# Patient Record
Sex: Male | Born: 1990 | Race: White | Hispanic: No | Marital: Single | Smoking: Never smoker
Health system: Southern US, Community
[De-identification: ages and names within clinical notes are randomized; demographics above are authoritative.]

## PROBLEM LIST (undated history)

## (undated) DIAGNOSIS — K859 Acute pancreatitis without necrosis or infection, unspecified: Secondary | ICD-10-CM

## (undated) DIAGNOSIS — R519 Headache, unspecified: Secondary | ICD-10-CM

## (undated) DIAGNOSIS — I350 Nonrheumatic aortic (valve) stenosis: Secondary | ICD-10-CM

## (undated) DIAGNOSIS — R011 Cardiac murmur, unspecified: Secondary | ICD-10-CM

## (undated) DIAGNOSIS — F1910 Other psychoactive substance abuse, uncomplicated: Secondary | ICD-10-CM

## (undated) DIAGNOSIS — J45909 Unspecified asthma, uncomplicated: Secondary | ICD-10-CM

## (undated) DIAGNOSIS — J189 Pneumonia, unspecified organism: Secondary | ICD-10-CM

---

## 2016-11-01 DIAGNOSIS — Q231 Congenital insufficiency of aortic valve: Secondary | ICD-10-CM | POA: Insufficient documentation

## 2016-11-01 DIAGNOSIS — I350 Nonrheumatic aortic (valve) stenosis: Secondary | ICD-10-CM | POA: Insufficient documentation

## 2020-03-01 ENCOUNTER — Other Ambulatory Visit: Payer: Self-pay

## 2020-03-01 DIAGNOSIS — Z20822 Contact with and (suspected) exposure to covid-19: Secondary | ICD-10-CM

## 2020-03-03 LAB — NOVEL CORONAVIRUS, NAA: SARS-CoV-2, NAA: DETECTED — AB

## 2020-03-03 LAB — SARS-COV-2, NAA 2 DAY TAT

## 2020-04-18 ENCOUNTER — Encounter (HOSPITAL_COMMUNITY): Payer: Self-pay

## 2020-04-18 ENCOUNTER — Emergency Department (HOSPITAL_COMMUNITY)
Admission: EM | Admit: 2020-04-18 | Discharge: 2020-04-18 | Disposition: A | Discharge status: Home / Self Care | Payer: Commercial Managed Care - PPO | Attending: Emergency Medicine | Admitting: Emergency Medicine

## 2020-04-18 ENCOUNTER — Other Ambulatory Visit: Payer: Self-pay

## 2020-04-18 DIAGNOSIS — R1012 Left upper quadrant pain: Secondary | ICD-10-CM | POA: Diagnosis present

## 2020-04-18 DIAGNOSIS — K852 Alcohol induced acute pancreatitis without necrosis or infection: Secondary | ICD-10-CM | POA: Insufficient documentation

## 2020-04-18 LAB — CBC WITH DIFFERENTIAL/PLATELET
Abs Immature Granulocytes: 0.04 10*3/uL (ref 0.00–0.07)
Basophils Absolute: 0 10*3/uL (ref 0.0–0.1)
Basophils Relative: 0 %
Eosinophils Absolute: 0.1 10*3/uL (ref 0.0–0.5)
Eosinophils Relative: 1 %
HCT: 41.8 % (ref 39.0–52.0)
Hemoglobin: 13.7 g/dL (ref 13.0–17.0)
Immature Granulocytes: 0 %
Lymphocytes Relative: 8 %
Lymphs Abs: 0.9 10*3/uL (ref 0.7–4.0)
MCH: 30.9 pg (ref 26.0–34.0)
MCHC: 32.8 g/dL (ref 30.0–36.0)
MCV: 94.1 fL (ref 80.0–100.0)
Monocytes Absolute: 0.8 10*3/uL (ref 0.1–1.0)
Monocytes Relative: 7 %
Neutro Abs: 8.7 10*3/uL — ABNORMAL HIGH (ref 1.7–7.7)
Neutrophils Relative %: 84 %
Platelets: 252 10*3/uL (ref 150–400)
RBC: 4.44 MIL/uL (ref 4.22–5.81)
RDW: 13.9 % (ref 11.5–15.5)
WBC: 10.5 10*3/uL (ref 4.0–10.5)
nRBC: 0 % (ref 0.0–0.2)

## 2020-04-18 LAB — COMPREHENSIVE METABOLIC PANEL
ALT: 22 U/L (ref 0–44)
AST: 27 U/L (ref 15–41)
Albumin: 4.4 g/dL (ref 3.5–5.0)
Alkaline Phosphatase: 46 U/L (ref 38–126)
Anion gap: 11 (ref 5–15)
BUN: 10 mg/dL (ref 6–20)
CO2: 25 mmol/L (ref 22–32)
Calcium: 9 mg/dL (ref 8.9–10.3)
Chloride: 101 mmol/L (ref 98–111)
Creatinine, Ser: 0.76 mg/dL (ref 0.61–1.24)
GFR, Estimated: >60 mL/min (ref >60)
Glucose, Bld: 138 mg/dL — ABNORMAL HIGH (ref 70–99)
Potassium: 3.8 mmol/L (ref 3.5–5.1)
Sodium: 137 mmol/L (ref 135–145)
Total Bilirubin: 1 mg/dL (ref 0.3–1.2)
Total Protein: 7.3 g/dL (ref 6.5–8.1)

## 2020-04-18 LAB — ETHANOL: Alcohol, Ethyl (B): <10 mg/dL (ref <10)

## 2020-04-18 LAB — LIPASE, BLOOD: Lipase: 1549 U/L — ABNORMAL HIGH (ref 11–51)

## 2020-04-18 MED ORDER — ONDANSETRON HCL 4 MG/2ML IJ SOLN
4.0000 mg | Freq: Once | INTRAMUSCULAR | Status: AC
  Administered 2020-04-18: 4 mg via INTRAVENOUS
  Filled 2020-04-18: qty 2

## 2020-04-18 MED ORDER — OXYCODONE HCL 5 MG PO TABS
5.0000 mg | ORAL_TABLET | Freq: Four times a day (QID) | ORAL | 0 refills | Status: DC | PRN
Start: 2020-04-18 — End: 2020-06-09

## 2020-04-18 MED ORDER — ONDANSETRON 4 MG PO TBDP
4.0000 mg | ORAL_TABLET | Freq: Once | ORAL | Status: AC
  Administered 2020-04-18: 4 mg via ORAL
  Filled 2020-04-18: qty 1

## 2020-04-18 MED ORDER — SODIUM CHLORIDE 0.9 % IV BOLUS
1000.0000 mL | Freq: Once | INTRAVENOUS | Status: AC
  Administered 2020-04-18: 1000 mL via INTRAVENOUS

## 2020-04-18 MED ORDER — ONDANSETRON 4 MG PO TBDP: 4.0000 mg | ORAL_TABLET | Freq: Three times a day (TID) | ORAL | 0 refills | Status: DC | PRN

## 2020-04-18 MED ORDER — FENTANYL CITRATE (PF) 100 MCG/2ML IJ SOLN
100.0000 ug | Freq: Once | INTRAMUSCULAR | Status: AC
  Administered 2020-04-18: 100 ug via INTRAVENOUS
  Filled 2020-04-18: qty 2

## 2020-04-18 MED ORDER — OXYCODONE HCL 5 MG PO TABS
5.0000 mg | ORAL_TABLET | Freq: Once | ORAL | Status: AC
  Administered 2020-04-18: 5 mg via ORAL
  Filled 2020-04-18: qty 1

## 2020-04-18 NOTE — Discharge Instructions (Signed)
Please read and follow all provided instructions.  Your diagnoses today include:  1. Alcohol-induced acute pancreatitis without infection or necrosis     Tests performed today include:  Blood cell counts and platelets  Kidney and liver function tests  Pancreas function test (called lipase) - is very high  Vital signs. See below for your results today.   Medications prescribed:   Oxycodone - narcotic pain medication  DO NOT drive or perform any activities that require you to be awake and alert because this medicine can make you drowsy.    Zofran (ondansetron) - for nausea and vomiting  Take any prescribed medications only as directed.  Home care instructions:   Follow any educational materials contained in this packet.  Please continue a clear liquid diet for the next 48 hours and then gradually introduce bland foods back into your diet  Follow-up instructions: Please follow-up with your primary care provider in the next 3 days for further evaluation of your symptoms and the gastroenterologist in the next week.   Return instructions:  SEEK IMMEDIATE MEDICAL ATTENTION IF: The pain does not go away or becomes severe   A temperature above 101F develops   Repeated vomiting occurs (multiple episodes)   The pain becomes localized to portions of the abdomen. The right side could possibly be appendicitis. In an adult, the left lower portion of the abdomen could be colitis or diverticulitis.   Blood is being passed in stools or vomit (bright red or black tarry stools)   You develop chest pain, difficulty breathing, dizziness or fainting, or become confused, poorly responsive, or inconsolable (young children)  If you have any other emergent concerns regarding your health  Additional Information: Abdominal (belly) pain can be caused by many things. Your caregiver performed an examination and possibly ordered blood/urine tests and imaging (CT scan, x-rays, ultrasound). Many  cases can be observed and treated at home after initial evaluation in the emergency department. Even though you are being discharged home, abdominal pain can be unpredictable. Therefore, you need a repeated exam if your pain does not resolve, returns, or worsens. Most patients with abdominal pain don't have to be admitted to the hospital or have surgery, but serious problems like appendicitis and gallbladder attacks can start out as nonspecific pain. Many abdominal conditions cannot be diagnosed in one visit, so follow-up evaluations are very important.  Your vital signs today were: BP 117/82    Pulse 86    Temp 97.9 F (36.6 C) (Oral)    Resp 16    Ht 5\' 11"  (1.803 m)    Wt 83.9 kg    SpO2 98%    BMI 25.80 kg/m  If your blood pressure (bp) was elevated above 135/85 this visit, please have this repeated by your doctor within one month. --------------

## 2020-04-18 NOTE — ED Provider Notes (Signed)
MSE was initiated and I personally evaluated the patient and placed orders (if any) at  6:22 AM on April 18, 2020.  The patient appears stable so that the remainder of the MSE may be completed by another provider.   Elizjah Noblet, Jonny Ruiz, MD 04/18/20 [phone]

## 2020-04-18 NOTE — ED Provider Notes (Signed)
[name]-EMERGENCY DEPT Provider Note   CSN: [id number] Arrival date & time: 04/18/20  0601     History Chief Complaint  Patient presents with  . Abdominal Pain    [name] [name] is a 29 y.o. male.  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  Patient feels milder pain radiating to her back.  No nausea, vomiting, diarrhea.  He took ibuprofen last night but denies heavy or daily NSAID use.  Last drink alcohol 2 days ago and then approximately 1 week before.  No hematuria or irritative UTI symptoms including dysuria, increased frequency or urgency. The onset of this condition was acute. The course is constant. Aggravating factors: none. Alleviating factors: none.         History reviewed. No pertinent past medical history.  There are no problems to display for this patient.   History reviewed. No pertinent surgical history.     No family history on file.  Social History   Tobacco Use  . Smoking status: Never Smoker  . Smokeless tobacco: Never Used  Substance Use Topics  . Alcohol use: Yes  . Drug use: Not Currently    Home Medications Prior to Admission medications   Not on File    Allergies    Amoxicillin  Review of Systems   Review of Systems  Constitutional: Negative for fever.  HENT: Negative for rhinorrhea and sore throat.   Eyes: Negative for redness.  Respiratory: Negative for cough.   Cardiovascular: Negative for chest pain.  Gastrointestinal: Positive for abdominal pain. Negative for diarrhea, nausea and vomiting.  Genitourinary: Negative for dysuria and hematuria.  Musculoskeletal: Positive for back pain. Negative for myalgias.  Skin: Negative for rash.  Neurological: Negative for headaches.    Physical Exam Updated Vital Signs BP (!)  148/106 (BP Location: Right Arm)   Pulse 78   Temp 97.9 F (36.6 C) (Oral)   Resp 14   Ht 5\' 11"  (1.803 m)   Wt 83.9 kg   SpO2 99%   BMI 25.80 kg/m   Physical Exam Vitals and nursing note reviewed.  Constitutional:      Appearance: He is well-developed.  HENT:     Head: Normocephalic and atraumatic.  Eyes:     General:        Right eye: No discharge.        Left eye: No discharge.     Conjunctiva/sclera: Conjunctivae normal.  Cardiovascular:     Rate and Rhythm: Normal rate and regular rhythm.     Heart sounds: Normal heart sounds.  Pulmonary:     Effort: Pulmonary effort is normal.     Breath sounds: Normal breath sounds.  Abdominal:     Palpations: Abdomen is soft.     Tenderness: There is abdominal tenderness in the left upper quadrant. There is no guarding or rebound. Negative signs include Murphy's sign and McBurney's sign.     Comments: Mild LUQ pain on palpation.   Musculoskeletal:     Cervical back: Normal range of motion and neck supple.  Skin:    General: Skin is warm and dry.  Neurological:     Mental Status: He is alert.     ED Results / Procedures / Treatments   Labs (all labs ordered are listed, but only abnormal results are displayed) Labs Reviewed  LIPASE, BLOOD - Abnormal;  Notable for the following components:      Result Value   Lipase 1,549 (*)    All other components within normal limits  COMPREHENSIVE METABOLIC PANEL - Abnormal; Notable for the following components:   Glucose, Bld 138 (*)    All other components within normal limits  CBC WITH DIFFERENTIAL/PLATELET - Abnormal; Notable for the following components:   Neutro Abs 8.7 (*)    All other components within normal limits  ETHANOL    EKG None  Radiology No results found.  Procedures Procedures (including critical care time)  Medications Ordered in ED Medications  ondansetron (ZOFRAN) injection 4 mg (4 mg Intravenous Given 04/18/20 0635)  fentaNYL (SUBLIMAZE) injection 100  mcg (100 mcg Intravenous Given 04/18/20 0635)  sodium chloride 0.9 % bolus 1,000 mL (1,000 mLs Intravenous New Bag/Given 04/18/20 3532)  oxyCODONE (Oxy IR/ROXICODONE) immediate release tablet 5 mg (5 mg Oral Given 04/18/20 0909)  ondansetron (ZOFRAN-ODT) disintegrating tablet 4 mg (4 mg Oral Given 04/18/20 9924)    ED Course  I have reviewed the triage vital signs and the nursing notes.  Pertinent labs & imaging results that were available during my care of the patient were reviewed by me and considered in my medical decision making (see chart for details).  Patient seen and examined. Work-up initiated. Medications ordered.   Vital signs reviewed and are as follows: BP (!) 148/106 (BP Location: Right Arm)   Pulse 78   Temp 97.9 F (36.6 C) (Oral)   Resp 14   Ht 5\' 11"  (1.803 m)   Wt 83.9 kg   SpO2 99%   BMI 25.80 kg/m   8:38 AM labs reviewed.  Patient appears to have recurrent pancreatitis with elevated lipase.  Patient updated.  He is getting IV fluids at the current time.  Discussed alcohol cessation with patient and discussed complications of pancreatitis including chronic pancreatitis, pseudocyst formation, chronic pain.  We also discussed how proceed.  We have a good idea of what is causing causing the pancreatitis today.  Currently vomiting is controlled and he has only had nausea, even without medication.  Pain is better controlled now.  After IV fluids are complete, will attempt to transition to p.o. medications and ice chips.  If he does well with this, patient is interested in going home today.  I am happy to prescribe medications for symptom control and have him adhere to a clear liquid diet for the next couple of days while symptoms improved.  Strongly encouraged PCP/GI follow-up as well.  We will continue to monitor.  11:06 AM pain is controlled and patient has had a few sips of water.  I asked him if he is comfortable discharge home.  He reiterates that he would like to go  home.  Strongly encourage PCP and gastroenterology follow-up.   The patient was urged to return to the Emergency Department immediately with worsening of current symptoms, worsening abdominal pain, persistent vomiting, blood noted in stools, fever, or any other concerns. The patient verbalized understanding.   Patient counseled on use of narcotic pain medications. Counseled not to combine these medications with others containing tylenol. Urged not to drink alcohol, drive, or perform any other activities that requires focus while taking these medications. The patient verbalizes understanding and agrees with the plan.  The patient was urged to return to the Emergency Department immediately with worsening of current symptoms, worsening abdominal pain, persistent vomiting, blood noted in stools, fever, or any other concerns. The patient verbalized understanding.  MDM Rules/Calculators/A&P                          Patient with acute pancreatitis, secondary to alcohol use.  This is the patient's second episode this year.  Symptoms remain reasonably controlled after several hours in the emergency department.  Patient is tolerating oral fluids.  He would like to be discharged home today.  Plan for discharged home with clear liquid diet, pain medication and nausea medication.  He is willing to return to the emergency department with worsening or changing symptoms.  I am comfortable with this plan.   Final Clinical Impression(s) / ED Diagnoses Final diagnoses:  Alcohol-induced acute pancreatitis without infection or necrosis    Rx / DC Orders ED Discharge Orders         Ordered    oxyCODONE (OXY IR/ROXICODONE) 5 MG immediate release tablet  Every 6 hours PRN        04/18/20 1101    ondansetron (ZOFRAN ODT) 4 MG disintegrating tablet  Every 8 hours PRN        04/18/20 1101           Renne Crigler, [name] 04/18/20 1108    Linwood Dibbles, MD 04/20/20 [phone]

## 2020-04-18 NOTE — ED Triage Notes (Signed)
Pt reports LUQ abdominal pain starting yesterday. Hx of pancreatitis.

## 2020-06-06 ENCOUNTER — Emergency Department (HOSPITAL_COMMUNITY): Payer: Commercial Managed Care - PPO

## 2020-06-06 ENCOUNTER — Other Ambulatory Visit: Payer: Self-pay

## 2020-06-06 ENCOUNTER — Encounter (HOSPITAL_COMMUNITY): Payer: Self-pay

## 2020-06-06 ENCOUNTER — Observation Stay (HOSPITAL_COMMUNITY): Payer: Commercial Managed Care - PPO

## 2020-06-06 ENCOUNTER — Inpatient Hospital Stay (HOSPITAL_COMMUNITY)
Admission: EM | Admit: 2020-06-06 | Discharge: 2020-06-09 | DRG: 439 | Disposition: A | Discharge status: Home / Self Care | Payer: Commercial Managed Care - PPO | Attending: Internal Medicine | Admitting: Internal Medicine

## 2020-06-06 DIAGNOSIS — Z8379 Family history of other diseases of the digestive system: Secondary | ICD-10-CM

## 2020-06-06 DIAGNOSIS — R7401 Elevation of levels of liver transaminase levels: Secondary | ICD-10-CM | POA: Diagnosis present

## 2020-06-06 DIAGNOSIS — I359 Nonrheumatic aortic valve disorder, unspecified: Secondary | ICD-10-CM | POA: Diagnosis present

## 2020-06-06 DIAGNOSIS — R188 Other ascites: Secondary | ICD-10-CM | POA: Diagnosis present

## 2020-06-06 DIAGNOSIS — I358 Other nonrheumatic aortic valve disorders: Secondary | ICD-10-CM | POA: Diagnosis present

## 2020-06-06 DIAGNOSIS — K859 Acute pancreatitis without necrosis or infection, unspecified: Secondary | ICD-10-CM | POA: Diagnosis not present

## 2020-06-06 DIAGNOSIS — K760 Fatty (change of) liver, not elsewhere classified: Secondary | ICD-10-CM | POA: Diagnosis present

## 2020-06-06 DIAGNOSIS — K861 Other chronic pancreatitis: Secondary | ICD-10-CM | POA: Diagnosis present

## 2020-06-06 DIAGNOSIS — Z20822 Contact with and (suspected) exposure to covid-19: Secondary | ICD-10-CM | POA: Diagnosis present

## 2020-06-06 DIAGNOSIS — Q231 Congenital insufficiency of aortic valve: Secondary | ICD-10-CM

## 2020-06-06 DIAGNOSIS — E876 Hypokalemia: Secondary | ICD-10-CM | POA: Clinically undetermined

## 2020-06-06 HISTORY — DX: Acute pancreatitis without necrosis or infection, unspecified: K85.90

## 2020-06-06 HISTORY — DX: Cardiac murmur, unspecified: R01.1

## 2020-06-06 LAB — COMPREHENSIVE METABOLIC PANEL
ALT: 58 U/L — ABNORMAL HIGH (ref 0–44)
AST: 50 U/L — ABNORMAL HIGH (ref 15–41)
Albumin: 5.1 g/dL — ABNORMAL HIGH (ref 3.5–5.0)
Alkaline Phosphatase: 68 U/L (ref 38–126)
Anion gap: 12 (ref 5–15)
BUN: 14 mg/dL (ref 6–20)
CO2: 26 mmol/L (ref 22–32)
Calcium: 9.7 mg/dL (ref 8.9–10.3)
Chloride: 103 mmol/L (ref 98–111)
Creatinine, Ser: 0.78 mg/dL (ref 0.61–1.24)
GFR, Estimated: >60 mL/min (ref >60)
Glucose, Bld: 171 mg/dL — ABNORMAL HIGH (ref 70–99)
Potassium: 3.7 mmol/L (ref 3.5–5.1)
Sodium: 141 mmol/L (ref 135–145)
Total Bilirubin: 0.6 mg/dL (ref 0.3–1.2)
Total Protein: 8.4 g/dL — ABNORMAL HIGH (ref 6.5–8.1)

## 2020-06-06 LAB — RAPID URINE DRUG SCREEN, HOSP PERFORMED
Amphetamines: NOT DETECTED
Barbiturates: POSITIVE — AB
Benzodiazepines: NOT DETECTED
Cocaine: NOT DETECTED
Opiates: POSITIVE — AB
Tetrahydrocannabinol: POSITIVE — AB

## 2020-06-06 LAB — URINALYSIS, ROUTINE W REFLEX MICROSCOPIC
Bacteria, UA: NONE SEEN
Bilirubin Urine: NEGATIVE
Glucose, UA: NEGATIVE mg/dL
Hgb urine dipstick: NEGATIVE
Ketones, ur: 80 mg/dL — AB
Leukocytes,Ua: NEGATIVE
Nitrite: NEGATIVE
Protein, ur: 100 mg/dL — AB
Specific Gravity, Urine: 1.032 — ABNORMAL HIGH (ref 1.005–1.030)
pH: 7 (ref 5.0–8.0)

## 2020-06-06 LAB — CBC
HCT: 42.7 % (ref 39.0–52.0)
Hemoglobin: 14.1 g/dL (ref 13.0–17.0)
MCH: 30.9 pg (ref 26.0–34.0)
MCHC: 33 g/dL (ref 30.0–36.0)
MCV: 93.4 fL (ref 80.0–100.0)
Platelets: 326 10*3/uL (ref 150–400)
RBC: 4.57 MIL/uL (ref 4.22–5.81)
RDW: 13.8 % (ref 11.5–15.5)
WBC: 12.5 10*3/uL — ABNORMAL HIGH (ref 4.0–10.5)
nRBC: 0 % (ref 0.0–0.2)

## 2020-06-06 LAB — RESP PANEL BY RT-PCR (FLU A&B, COVID) ARPGX2
Influenza A by PCR: NEGATIVE
Influenza B by PCR: NEGATIVE
SARS Coronavirus 2 by RT PCR: NEGATIVE

## 2020-06-06 LAB — ETHANOL: Alcohol, Ethyl (B): <10 mg/dL (ref <10)

## 2020-06-06 LAB — LIPASE, BLOOD: Lipase: 198 U/L — ABNORMAL HIGH (ref 11–51)

## 2020-06-06 MED ORDER — ENOXAPARIN SODIUM 40 MG/0.4ML ~~LOC~~ SOLN
40.0000 mg | SUBCUTANEOUS | Status: DC
  Administered 2020-06-06 – 2020-06-08 (×3): 40 mg via SUBCUTANEOUS
  Filled 2020-06-06 (×3): qty 0.4

## 2020-06-06 MED ORDER — ONDANSETRON 4 MG PO TBDP
4.0000 mg | ORAL_TABLET | Freq: Once | ORAL | Status: AC | PRN
  Administered 2020-06-06: 14:00:00 4 mg via ORAL
  Filled 2020-06-06: qty 1

## 2020-06-06 MED ORDER — IOHEXOL 300 MG/ML  SOLN
100.0000 mL | Freq: Once | INTRAMUSCULAR | Status: AC | PRN
  Administered 2020-06-06: 19:00:00 100 mL via INTRAVENOUS

## 2020-06-06 MED ORDER — LACTATED RINGERS IV SOLN: INTRAVENOUS | Status: AC

## 2020-06-06 MED ORDER — SODIUM CHLORIDE 0.9 % IV BOLUS
1000.0000 mL | Freq: Once | INTRAVENOUS | Status: AC
  Administered 2020-06-06: 21:00:00 1000 mL via INTRAVENOUS

## 2020-06-06 MED ORDER — MORPHINE SULFATE (PF) 2 MG/ML IV SOLN
2.0000 mg | INTRAVENOUS | Status: DC | PRN
  Administered 2020-06-06 – 2020-06-08 (×4): 2 mg via INTRAVENOUS
  Filled 2020-06-06 (×4): qty 1

## 2020-06-06 MED ORDER — MORPHINE SULFATE (PF) 4 MG/ML IV SOLN
4.0000 mg | Freq: Once | INTRAVENOUS | Status: AC
  Administered 2020-06-06: 19:00:00 4 mg via INTRAVENOUS
  Filled 2020-06-06: qty 1

## 2020-06-06 MED ORDER — ONDANSETRON HCL 4 MG PO TABS: 4.0000 mg | ORAL_TABLET | Freq: Four times a day (QID) | ORAL | Status: DC | PRN

## 2020-06-06 MED ORDER — SODIUM CHLORIDE 0.9 % IV BOLUS
1000.0000 mL | Freq: Once | INTRAVENOUS | Status: AC
  Administered 2020-06-06: 19:00:00 1000 mL via INTRAVENOUS

## 2020-06-06 MED ORDER — ONDANSETRON HCL 4 MG/2ML IJ SOLN: 4.0000 mg | Freq: Four times a day (QID) | INTRAMUSCULAR | Status: DC | PRN

## 2020-06-06 MED ORDER — SENNOSIDES-DOCUSATE SODIUM 8.6-50 MG PO TABS: 1.0000 | ORAL_TABLET | Freq: Every evening | ORAL | Status: DC | PRN

## 2020-06-06 MED ORDER — OXYCODONE HCL 5 MG PO TABS
5.0000 mg | ORAL_TABLET | ORAL | Status: DC | PRN
  Administered 2020-06-07 – 2020-06-08 (×7): 5 mg via ORAL
  Filled 2020-06-06 (×7): qty 1

## 2020-06-06 NOTE — H&P (Addendum)
History and Physical    [name] [name] HQR:[id number] DOB: 1990-10-30 DOA: 06/06/2020  PCP: Patient, No Pcp Per  Patient coming from: Home  I have personally briefly reviewed patient's old medical records in [name]  Chief Complaint: Abdominal pain, nausea, vomiting  HPI: [name] [name] is a 29 y.o. male with medical history significant for pancreatitis, bicuspid aortic valve with mild aortic stenosis who presents to the ED for evaluation of abdominal pain, nausea, and vomiting.  Patient previously hospitalized at [facility] 08/17/2019-08/20/2019 for acute pancreatitis which at the time was felt related to alcohol use.  Lipase was 188.  CT abdomen/pelvis that admission showed phlegmonous change along the pancreatic head in addition to the acute pancreatitis findings.  Patient was managed with IV fluids, pain control and discharged home.  Patient was seen again in [facility] 04/18/2020 and again diagnosed with acute pancreatitis with lipase up to 1549.  This was again felt related to alcohol use and patient was able to discharge to home after initial management with IV fluids, antiemetics, and pain control.  Patient states she was in his usual state of health until last night when he developed significant mid back pain.  He was able to tolerate at home and went to sleep.  This morning he woke with worsening of the back pain which now radiated to the front of his abdomen across the epigastric region.  Pain was severe and up to 10/10 in intensity.  He tried to relieve it at home by taking ibuprofen, Tylenol, and CBD without relief.  He developed associated nausea and vomiting.  He had some chills as well as some pain with deep inspiration.  He came to the ED for further evaluation and management.  Patient states that since his last ED visit he has been significantly limiting his alcohol intake and working on dietary changes.  He says Christmas day he had increased fatty food intake  and 1 drink of alcohol but no other alcohol use in the last 2 weeks.  He says the only medication he takes on a regular basis is Claritin.  Otherwise he uses ibuprofen about once a week.  He reports a family history of nonalcoholic liver cirrhosis in his maternal grandfather.  He denies any tobacco use, cocaine use, or injection drug use.   ED Course:  Initial vitals showed BP 161/105, pulse 60, RR 22, SPO2 100% on room air.  Labs show lipase 198, sodium 141, potassium 3.7, bicarb 26, BUN 14, creatinine 0.78, serum glucose 171, AST 50, ALT 58, alk phos 68, total bilirubin 0.6, WBC 12.5, hemoglobin 14.1, platelets 326,000.  CT abdomen/pelvis with contrast shows acute edematous pancreatitis with moderate amount of peripancreatic fat stranding and acute peripancreatic fluid collection measuring 3.7 x 3.5 x 4.0 cm about the pancreatic head.  Changes of hepatic steatosis also noted.  Patient was given 2 L normal saline, IV morphine 4 mg, and IV Zofran.  The hospitalist service was consulted to admit for further evaluation and management.  Review of Systems: All systems reviewed and are negative except as documented in history of present illness above.   Past Medical History:  Diagnosis Date  . Heart murmur   . Pancreatitis     History reviewed. No pertinent surgical history.  Social History:  reports that he has never smoked. He has never used smokeless tobacco. He reports current alcohol use. He reports previous drug use.  Allergies  Allergen Reactions  . Amoxicillin Hives  Family History  Problem Relation Age of Onset  . Pancreatitis Maternal Uncle      Prior to Admission medications   Medication Sig Start Date End Date Taking? Authorizing Provider  albuterol (VENTOLIN HFA) 108 (90 Base) MCG/ACT inhaler Inhale 2 puffs into the lungs every 6 (six) hours as needed for wheezing or shortness of breath.    [provider]  loratadine (CLARITIN) 10 MG tablet Take 10 mg by  mouth daily.    [provider]  ondansetron (ZOFRAN ODT) 4 MG disintegrating tablet Take 1 tablet (4 mg total) by mouth every 8 (eight) hours as needed for nausea or vomiting. 04/18/20   Carlisle Cater, [name]  oxyCODONE (OXY IR/ROXICODONE) 5 MG immediate release tablet Take 1 tablet (5 mg total) by mouth every 6 (six) hours as needed for severe pain. 04/18/20   Carlisle Cater, [name]    Physical Exam: Vitals:   06/06/20 1830 06/06/20 1845 06/06/20 1900 06/06/20 2027  BP: (!) 162/102  (!) 157/96 126/90  Pulse: 64 60 67 65  Resp: 13 (!) _0 Temp:      SpO2: 100% 99% 100% 100%  Weight:      Height:       Constitutional: Resting in bed, NAD, calm, comfortable Eyes: PERRL, lids and conjunctivae normal ENMT: Mucous membranes are moist. Posterior pharynx clear of any exudate or lesions.Normal dentition.  Neck: normal, supple, no masses. Respiratory: clear to auscultation bilaterally, no wheezing, no crackles. Normal respiratory effort. No accessory muscle use.  Cardiovascular: Regular rate and rhythm, 2/6 systolic murmur present upper sternal borders. No extremity edema. 2+ pedal pulses. Abdomen: Mild epigastric tenderness, no masses palpated. No hepatosplenomegaly. Bowel sounds positive.  Musculoskeletal: no clubbing / cyanosis. No joint deformity upper and lower extremities. Good ROM, no contractures. Normal muscle tone.  Skin: no rashes, lesions, ulcers. No induration Neurologic: CN 2-12 grossly intact. Sensation intact, Strength 5/5 in all 4.  Psychiatric: Normal judgment and insight. Alert and oriented x 3. Normal mood.   Labs on Admission: I have personally reviewed following labs and imaging studies  CBC: Recent Labs  Lab 06/06/20 1357  WBC 12.5*  HGB 14.1  HCT 42.7  MCV 93.4  PLT 623   Basic Metabolic Panel: Recent Labs  Lab 06/06/20 1357  NA 141  K 3.7  CL 103  CO2 26  GLUCOSE 171*  BUN 14  CREATININE 0.78  CALCIUM 9.7   GFR: Estimated Creatinine  Clearance: 145.1 mL/min (by C-G formula based on SCr of 0.78 mg/dL). Liver Function Tests: Recent Labs  Lab 06/06/20 1357  AST 50*  ALT 58*  ALKPHOS 68  BILITOT 0.6  PROT 8.4*  ALBUMIN 5.1*   Recent Labs  Lab 06/06/20 1357  LIPASE 198*   No results for input(s): AMMONIA in the last 168 hours. Coagulation Profile: No results for input(s): INR, PROTIME in the last 168 hours. Cardiac Enzymes: No results for input(s): CKTOTAL, CKMB, CKMBINDEX, TROPONINI in the last 168 hours. BNP (last 3 results) No results for input(s): PROBNP in the last 8760 hours. HbA1C: No results for input(s): HGBA1C in the last 72 hours. CBG: No results for input(s): GLUCAP in the last 168 hours. Lipid Profile: No results for input(s): CHOL, HDL, LDLCALC, TRIG, CHOLHDL, LDLDIRECT in the last 72 hours. Thyroid Function Tests: No results for input(s): TSH, T4TOTAL, FREET4, T3FREE, THYROIDAB in the last 72 hours. Anemia Panel: No results for input(s): VITAMINB12, FOLATE, FERRITIN, TIBC, IRON, RETICCTPCT in the last 72 hours.  Urine analysis: No results found for: COLORURINE, APPEARANCEUR, LABSPEC, [place], GLUCOSEU, HGBUR, BILIRUBINUR, KETONESUR, PROTEINUR, UROBILINOGEN, NITRITE, LEUKOCYTESUR  Radiological Exams on Admission: CT ABDOMEN PELVIS W CONTRAST  Result Date: 06/06/2020 CLINICAL DATA:  Left upper quadrant abdominal pain. History of pancreatitis. Patient reports severe back pain. EXAM: CT ABDOMEN AND PELVIS WITH CONTRAST TECHNIQUE: Multidetector CT imaging of the abdomen and pelvis was performed using the standard protocol following bolus administration of intravenous contrast. CONTRAST:  169mL OMNIPAQUE IOHEXOL 300 MG/ML  SOLN COMPARISON:  None. FINDINGS: Lower chest: No focal airspace disease or pleural effusion. The heart is normal in size. Suggestion of aortic valvular calcifications, partially obscured by cardiac motion. Hepatobiliary: Diffusely decreased hepatic density consistent with steatosis.  More focal fatty infiltration adjacent to the falciform ligament. No discrete focal hepatic lesion. Gallbladder physiologically distended, no calcified stone. No biliary dilatation. Pancreas: Moderate amount of peripancreatic fat stranding and edema adjacent to the head and uncinate process of the pancreas, tracking into the retroperitoneum. Adjacent ill-defined peripancreatic fluid abutting the stomach and duodenum. There is heterogeneous intrapancreatic collection measuring 3.7 x 3.5 x 4.0 cm, primarily cystic with some soft tissue nodularity peripherally. No internal air. No evidence of pancreatic necrosis. This causes mild mass effect and deformity of the superior mesenteric vein but no evidence of internal thrombus. Mid pancreatic ductal dilatation of 6 mm. Spleen: Normal in size without focal abnormality. Cleft posteriorly. Adrenals/Urinary Tract: Normal adrenal glands. No hydronephrosis or perinephric edema. Homogeneous renal enhancement. Urinary bladder is nondistended, grossly normal. Stomach/Bowel: Peripancreatic inflammatory changes with ill-defined fluid extends to the Peri pyloric stomach and proximal duodenum. Low-density involving the duodenal bulb anteriorly, series 2, image 36, is presumably ill-defined wall thickening rather than discontinuity. Inflammatory changes extend adjacent to the second and third portion of the duodenum. No abnormal gastric distension. Remainder of the small bowel is decompressed. Appendix is tentatively but not definitively visualized and normal. There is no colonic wall thickening or inflammatory change. Majority of the colon is decompressed which limits assessment. Vascular/Lymphatic: Peripancreatic inflammatory changes cause mild mass effect and central deviation of the superior mesenteric vein but no evidence of venous thrombus. The portal and splenic veins are patent. Multiple small central mesenteric, peripancreatic, and upper retroperitoneal lymph nodes are  presumably reactive. Normal caliber abdominal aorta. Reproductive: Prostate is unremarkable. Other: Peripancreatic fat stranding with ill-defined fluid, inflammatory changes track distally in the right retroperitoneum with non organized free fluid in the right pericolic gutter. Small amount of mesenteric free fluid which tracks into the pelvis. There is no free intra-abdominal air. Musculoskeletal: There are no acute or suspicious osseous abnormalities. IMPRESSION: 1. Acute edematous pancreatitis with moderate amount of peripancreatic fat stranding and acute peripancreatic fluid collection measuring 3.7 x 3.5 x 4.0 cm about the pancreatic head. This causes mild mass effect and deformity of the superior mesenteric vein but no evidence of internal thrombus. 2. Low-density involving the duodenal bulb anteriorly is presumably ill-defined wall thickening and fluid rather than discontinuity. 3. Hepatic steatosis. 4. Aortic valve calcifications are partially included in the lower thorax. Consider further evaluation with echocardiogram. Electronically Signed   By: Keith Rake M.D.   On: 06/06/2020 19:35    EKG: Not performed.  Assessment/Plan Principal Problem:   Acute pancreatitis  Camdin Hegner is a 29 y.o. male with medical history significant for pancreatitis, bicuspid aortic valve with mild aortic stenosis who is admitted with recurrent acute pancreatitis.  Acute pancreatitis: Recurrent, third episode in the past year.  Prior episodes thought related to  alcohol use.  Patient reports significant decrease in alcohol intake with 1 drink in the last 2 weeks.  CT shows acute edematous pancreatitis with peripancreatic fluid collection at the pancreatic head. -Keep n.p.o. except for sips with meds, advance diet as tolerated -Continue pain control with oxycodone and IV morphine as needed with hold parameters -Antiemetics as needed -Continue IV fluid hydration overnight -Obtain RUQ ultrasound  DVT  prophylaxis: Lovenox Code Status: Full code, confirmed with patient Family Communication: Discussed with patient, he has discussed with family Disposition Plan: From home and likely discharged home pending adequate pain control and ability to maintain adequate oral intake Consults called: None Admission status:  Status is: Observation  The patient remains OBS appropriate and will d/c before 2 midnights.  Dispo: The patient is from: Home              Anticipated d/c is to: Home              Anticipated d/c date is: 1 day              Patient currently is not medically stable to d/c.   Zada Finders MD [facility]  If 7PM-7AM, please contact night-coverage [email or web address]  06/06/2020, 9:39 PM

## 2020-06-06 NOTE — ED Notes (Signed)
Attempted to call floor for report x2, no answer.

## 2020-06-06 NOTE — ED Triage Notes (Addendum)
Pt reports severe back pain that started last night and now endorses abdominal pain and N/V that began this morning. Pt denies urinary problems. Pt has hx of pancreatitis. Pt reports last acoholic drink was 2-3 weeks ago.

## 2020-06-06 NOTE — ED Provider Notes (Signed)
[facility]-EMERGENCY DEPT Provider Note   CSN: [id number] Arrival date & time: 06/06/20  1339     History Chief Complaint  Patient presents with  . Back Pain  . Abdominal Pain    [name] [name] is a 29 y.o. male.  [name] [name] is a 29 y.o. male with a history of heart murmur and pancreatitis, who presents to the ED for evaluation of upper abdominal pain that radiates back to his back.  This pain has been present since last night and has been severe and worsening.  He reports that pain kept him from sleeping for most of the night.  He is also had nausea and vomiting that began this morning with multiple episodes of nonbloody emesis.  Reports normal bowel movements, no blood in the stool or melena.  Denies any associated chest pain or shortness of breath.  States he has had some sweats and chills when pain has become severe but no documented fevers.  No cough.  No dysuria or urinary frequency.  Reports this episode feels very similar to pancreatitis that he had in November.  They thought that pancreatitis may have been triggered by alcohol use at that time because he had had an episode of heavy drinking 1 week prior to symptoms beginning, he reports that he has not drink at all in the last 2 to 3 weeks so is unsure what to be causing this.  Reports he did have a lot of foods that he does not typically eat over Christmas.  Tried Tylenol, ibuprofen and CBD without relief.  No other aggravating or alleviating factors.        Past Medical History:  Diagnosis Date  . Heart murmur   . Pancreatitis     There are no problems to display for this patient.   History reviewed. No pertinent surgical history.     History reviewed. No pertinent family history.  Social History   Tobacco Use  . Smoking status: Never Smoker  . Smokeless tobacco: Never Used  Substance Use Topics  . Alcohol use: Yes  . Drug use: Not Currently    Home Medications Prior to Admission  medications   Medication Sig Start Date End Date Taking? Authorizing Provider  albuterol (VENTOLIN HFA) 108 (90 Base) MCG/ACT inhaler Inhale 2 puffs into the lungs every 6 (six) hours as needed for wheezing or shortness of breath.    [provider]  loratadine (CLARITIN) 10 MG tablet Take 10 mg by mouth daily.    [provider]  ondansetron (ZOFRAN ODT) 4 MG disintegrating tablet Take 1 tablet (4 mg total) by mouth every 8 (eight) hours as needed for nausea or vomiting. 04/18/20   Renne Crigler, [name]  oxyCODONE (OXY IR/ROXICODONE) 5 MG immediate release tablet Take 1 tablet (5 mg total) by mouth every 6 (six) hours as needed for severe pain. 04/18/20   Renne Crigler, [name]    Allergies    Amoxicillin  Review of Systems   Review of Systems  Constitutional: Positive for chills. Negative for fever.  HENT: Negative.   Respiratory: Negative for cough and shortness of breath.   Cardiovascular: Negative for chest pain.  Gastrointestinal: Positive for abdominal pain, nausea and vomiting. Negative for diarrhea.  Genitourinary: Negative for dysuria and hematuria.  Musculoskeletal: Negative for arthralgias and myalgias.  Skin: Negative for color change and rash.  Neurological: Negative for dizziness, syncope and light-headedness.  All other systems reviewed and are negative.   Physical Exam Updated Vital Signs  BP (!) 163/112 (BP Location: Right Arm)   Pulse 61   Temp (!) 97.4 F (36.3 C)   Resp 16   Ht 5\' 11"  (1.803 m)   Wt 81.6 kg   SpO2 100%   BMI 25.10 kg/m   Physical Exam Vitals and nursing note reviewed.  Constitutional:      General: He is not in acute distress.    Appearance: He is well-developed and well-nourished. He is not ill-appearing or diaphoretic.     Comments: Patient appears uncomfortable but is in no acute distress.  HENT:     Head: Normocephalic and atraumatic.     Mouth/Throat:     Mouth: Oropharynx is clear and moist.  Eyes:     General:         Right eye: No discharge.        Left eye: No discharge.     Extraocular Movements: EOM normal.     Pupils: Pupils are equal, round, and reactive to light.  Cardiovascular:     Rate and Rhythm: Normal rate and regular rhythm.     Pulses: Intact distal pulses.     Heart sounds: Normal heart sounds.  Pulmonary:     Effort: Pulmonary effort is normal. No respiratory distress.     Breath sounds: Normal breath sounds. No wheezing or rales.  Abdominal:     General: Bowel sounds are normal. There is no distension.     Palpations: Abdomen is soft. There is no mass.     Tenderness: There is abdominal tenderness in the right upper quadrant, epigastric area and left upper quadrant. There is guarding.     Comments: Abdomen is soft, nondistended, bowel sounds present throughout, there is tenderness across the upper abdomen that does not localize to 1 side, slight voluntary guarding, no lower abdominal tenderness.  Patient has some CVA tenderness bilaterally.  Musculoskeletal:        General: No deformity or edema.     Cervical back: Neck supple.  Skin:    General: Skin is warm and dry.     Capillary Refill: Capillary refill takes less than 2 seconds.  Neurological:     Mental Status: He is alert.     Coordination: Coordination normal.     Comments: Speech is clear, able to follow commands Moves extremities without ataxia, coordination intact  Psychiatric:        Mood and Affect: Mood normal.        Behavior: Behavior normal.     ED Results / Procedures / Treatments   Labs (all labs ordered are listed, but only abnormal results are displayed) Labs Reviewed  LIPASE, BLOOD - Abnormal; Notable for the following components:      Result Value   Lipase 198 (*)    All other components within normal limits  COMPREHENSIVE METABOLIC PANEL - Abnormal; Notable for the following components:   Glucose, Bld 171 (*)    Total Protein 8.4 (*)    Albumin 5.1 (*)    AST 50 (*)    ALT 58 (*)    All  other components within normal limits  CBC - Abnormal; Notable for the following components:   WBC 12.5 (*)    All other components within normal limits  URINALYSIS, ROUTINE W REFLEX MICROSCOPIC    EKG None  Radiology CT ABDOMEN PELVIS W CONTRAST  Result Date: 06/06/2020 CLINICAL DATA:  Left upper quadrant abdominal pain. History of pancreatitis. Patient reports severe back pain. EXAM: CT ABDOMEN  AND PELVIS WITH CONTRAST TECHNIQUE: Multidetector CT imaging of the abdomen and pelvis was performed using the standard protocol following bolus administration of intravenous contrast. CONTRAST:  OMNIPAQUE IOHEXOL 300 MG/ML  SOLN COMPARISON:  None. FINDINGS: Lower chest: No focal airspace disease or pleural effusion. The heart is normal in size. Suggestion of aortic valvular calcifications, partially obscured by cardiac motion. Hepatobiliary: Diffusely decreased hepatic density consistent with steatosis. More focal fatty infiltration adjacent to the falciform ligament. No discrete focal hepatic lesion. Gallbladder physiologically distended, no calcified stone. No biliary dilatation. Pancreas: Moderate amount of peripancreatic fat stranding and edema adjacent to the head and uncinate process of the pancreas, tracking into the retroperitoneum. Adjacent ill-defined peripancreatic fluid abutting the stomach and duodenum. There is heterogeneous intrapancreatic collection measuring 3.7 x 3.5 x 4.0 cm, primarily cystic with some soft tissue nodularity peripherally. No internal air. No evidence of pancreatic necrosis. This causes mild mass effect and deformity of the superior mesenteric vein but no evidence of internal thrombus. Mid pancreatic ductal dilatation of 6 mm. Spleen: Normal in size without focal abnormality. Cleft posteriorly. Adrenals/Urinary Tract: Normal adrenal glands. No hydronephrosis or perinephric edema. Homogeneous renal enhancement. Urinary bladder is nondistended, grossly normal.  Stomach/Bowel: Peripancreatic inflammatory changes with ill-defined fluid extends to the Peri pyloric stomach and proximal duodenum. Low-density involving the duodenal bulb anteriorly, series 2, image 36, is presumably ill-defined wall thickening rather than discontinuity. Inflammatory changes extend adjacent to the second and third portion of the duodenum. No abnormal gastric distension. Remainder of the small bowel is decompressed. Appendix is tentatively but not definitively visualized and normal. There is no colonic wall thickening or inflammatory change. Majority of the colon is decompressed which limits assessment. Vascular/Lymphatic: Peripancreatic inflammatory changes cause mild mass effect and central deviation of the superior mesenteric vein but no evidence of venous thrombus. The portal and splenic veins are patent. Multiple small central mesenteric, peripancreatic, and upper retroperitoneal lymph nodes are presumably reactive. Normal caliber abdominal aorta. Reproductive: Prostate is unremarkable. Other: Peripancreatic fat stranding with ill-defined fluid, inflammatory changes track distally in the right retroperitoneum with non organized free fluid in the right pericolic gutter. Small amount of mesenteric free fluid which tracks into the pelvis. There is no free intra-abdominal air. Musculoskeletal: There are no acute or suspicious osseous abnormalities. IMPRESSION: 1. Acute edematous pancreatitis with moderate amount of peripancreatic fat stranding and acute peripancreatic fluid collection measuring 3.7 x 3.5 x 4.0 cm about the pancreatic head. This causes mild mass effect and deformity of the superior mesenteric vein but no evidence of internal thrombus. 2. Low-density involving the duodenal bulb anteriorly is presumably ill-defined wall thickening and fluid rather than discontinuity. 3. Hepatic steatosis. 4. Aortic valve calcifications are partially included in the lower thorax. Consider further  evaluation with echocardiogram. Electronically Signed   By: Narda Rutherford M.D.   On: 06/06/2020 19:35   US Abdomen Limited RUQ (LIVER/GB)  Result Date: 06/06/2020 CLINICAL DATA:  Acute pancreatitis EXAM: ULTRASOUND ABDOMEN LIMITED RIGHT UPPER QUADRANT COMPARISON:  None. FINDINGS: Gallbladder: No gallstones or wall thickening visualized. No sonographic Murphy sign noted by sonographer. Common bile duct: Diameter: 3.9 mm Liver: Increased echotexture seen throughout. No focal abnormality or biliary ductal dilatation. Portal vein is patent on color Doppler imaging with normal direction of blood flow towards the liver. Other: None. IMPRESSION: Mild hepatic steatosis. Normal appearing gallbladder. Electronically Signed   By: Jonna Clark M.D.   On: 06/06/2020 22:14    Procedures Procedures (including critical care time)  Medications Ordered in ED Medications  enoxaparin (LOVENOX) injection 40 mg (40 mg Subcutaneous Given 06/06/20 2211)  lactated ringers infusion ( Intravenous New Bag/Given 06/06/20 2209)  oxyCODONE (Oxy IR/ROXICODONE) immediate release tablet 5 mg (has no administration in time range)  morphine 2 MG/ML injection 2 mg (2 mg Intravenous Given 06/06/20 2208)  ondansetron (ZOFRAN) tablet 4 mg (has no administration in time range)    Or  ondansetron (ZOFRAN) injection 4 mg (has no administration in time range)  senna-docusate (Senokot-S) tablet 1 tablet (has no administration in time range)  ondansetron (ZOFRAN-ODT) disintegrating tablet 4 mg (4 mg Oral Given 06/06/20 1353)  sodium chloride 0.9 % bolus 1,000 mL (0 mLs Intravenous Stopped 06/06/20 2031)  morphine 4 MG/ML injection 4 mg (4 mg Intravenous Given 06/06/20 1854)  iohexol (OMNIPAQUE) 300 MG/ML solution 100 mL (100 mLs Intravenous Contrast Given 06/06/20 1917)  sodium chloride 0.9 % bolus 1,000 mL (0 mLs Intravenous Stopped 06/06/20 2139)    ED Course  I have reviewed the triage vital signs and the nursing  notes.  Pertinent labs & imaging results that were available during my care of the patient were reviewed by me and considered in my medical decision making (see chart for details).    MDM Rules/Calculators/A&P                         29 year old male with prior history of presents with mid upper abdominal pain radiating to the back that began last night.  History of pancreatitis with similar symptoms, previously felt to be related to alcohol use, but patient has significantly reduced his alcohol use in the past 2 to 3 weeks and has been trying to watch his diet aside from eating a lot over the Christmas holiday.  Last episode of pancreatitis at the beginning of November, was able to be managed with pain and nausea medication as an outpatient.  Labs obtained from triage which are significant for a lipase of 198, which is elevated but is not nearly as high as it was during last episode of pancreatitis with a lipase of 1500.  Patient with mild leukocytosis of 12.5, glucose of 171 but no other significant electrolyte derangements, mildly elevated AST and ALT in the 50s but LFTs otherwise unremarkable.  Given patient's significant pain but overall reassuring lipase and significant decrease in alcohol use will get CT to assess for pancreatitis despite minimally elevated lipase versus other cause for patient's pain such as gastritis, PUD, gastroenteritis, cholecystitis or other intra-abdominal pathology.  IV fluids, pain and nausea medication given.  Despite minimally elevated lipase CT shows acute edematous pancreatitis with a moderate amount of peripancreatic fat stranding and acute peripancreatic fluid collection measuring 3.7 x 3.5 x 4 cm about the pancreatic head causing mild mass-effect and deformity of the superior mesenteric vein but no evidence of an internal thrombus.  Low density involving the duodenal bulb anteriorly is likely wall thickening.   Pain is improved and nausea is currently well  controlled with Zofran, but patient still having some generalized upper abdominal pain.  Given this is his third episode of pancreatitis this year and his second in the past 2 months despite significantly reducing alcohol intake feel he would benefit from admission and further evaluation.  Case discussed with Dr. Allena Katz with [facility] hospitalist who will see and admit the patient.   Final Clinical Impression(s) / ED Diagnoses Final diagnoses:  Acute pancreatitis    Rx / DC Orders ED  Discharge Orders    None       Dartha LodgeFord, Morelia Cassells N, [place] JerseyPA-C 06/06/20 2317    Melene PlanFloyd, Dan, DO 06/06/20 2320

## 2020-06-07 DIAGNOSIS — R7401 Elevation of levels of liver transaminase levels: Secondary | ICD-10-CM | POA: Diagnosis not present

## 2020-06-07 DIAGNOSIS — Z20822 Contact with and (suspected) exposure to covid-19: Secondary | ICD-10-CM | POA: Diagnosis present

## 2020-06-07 DIAGNOSIS — I361 Nonrheumatic tricuspid (valve) insufficiency: Secondary | ICD-10-CM | POA: Diagnosis not present

## 2020-06-07 DIAGNOSIS — Z8379 Family history of other diseases of the digestive system: Secondary | ICD-10-CM | POA: Diagnosis not present

## 2020-06-07 DIAGNOSIS — I359 Nonrheumatic aortic valve disorder, unspecified: Secondary | ICD-10-CM | POA: Diagnosis not present

## 2020-06-07 DIAGNOSIS — R7989 Other specified abnormal findings of blood chemistry: Secondary | ICD-10-CM

## 2020-06-07 DIAGNOSIS — K760 Fatty (change of) liver, not elsewhere classified: Secondary | ICD-10-CM | POA: Diagnosis present

## 2020-06-07 DIAGNOSIS — I351 Nonrheumatic aortic (valve) insufficiency: Secondary | ICD-10-CM | POA: Diagnosis not present

## 2020-06-07 DIAGNOSIS — E876 Hypokalemia: Secondary | ICD-10-CM | POA: Diagnosis present

## 2020-06-07 DIAGNOSIS — I350 Nonrheumatic aortic (valve) stenosis: Secondary | ICD-10-CM | POA: Diagnosis not present

## 2020-06-07 DIAGNOSIS — R188 Other ascites: Secondary | ICD-10-CM | POA: Diagnosis present

## 2020-06-07 DIAGNOSIS — K859 Acute pancreatitis without necrosis or infection, unspecified: Secondary | ICD-10-CM | POA: Diagnosis present

## 2020-06-07 DIAGNOSIS — K861 Other chronic pancreatitis: Secondary | ICD-10-CM | POA: Diagnosis present

## 2020-06-07 DIAGNOSIS — Q231 Congenital insufficiency of aortic valve: Secondary | ICD-10-CM | POA: Diagnosis not present

## 2020-06-07 DIAGNOSIS — I358 Other nonrheumatic aortic valve disorders: Secondary | ICD-10-CM | POA: Diagnosis present

## 2020-06-07 LAB — COMPREHENSIVE METABOLIC PANEL
ALT: 36 U/L (ref 0–44)
AST: 24 U/L (ref 15–41)
Albumin: 3.9 g/dL (ref 3.5–5.0)
Alkaline Phosphatase: 48 U/L (ref 38–126)
Anion gap: 6 (ref 5–15)
BUN: 11 mg/dL (ref 6–20)
CO2: 29 mmol/L (ref 22–32)
Calcium: 8.9 mg/dL (ref 8.9–10.3)
Chloride: 106 mmol/L (ref 98–111)
Creatinine, Ser: 0.6 mg/dL — ABNORMAL LOW (ref 0.61–1.24)
GFR, Estimated: >60 mL/min (ref >60)
Glucose, Bld: 105 mg/dL — ABNORMAL HIGH (ref 70–99)
Potassium: 3.1 mmol/L — ABNORMAL LOW (ref 3.5–5.1)
Sodium: 141 mmol/L (ref 135–145)
Total Bilirubin: 0.8 mg/dL (ref 0.3–1.2)
Total Protein: 6.6 g/dL (ref 6.5–8.1)

## 2020-06-07 LAB — CBC
HCT: 36.5 % — ABNORMAL LOW (ref 39.0–52.0)
Hemoglobin: 11.9 g/dL — ABNORMAL LOW (ref 13.0–17.0)
MCH: 30.9 pg (ref 26.0–34.0)
MCHC: 32.6 g/dL (ref 30.0–36.0)
MCV: 94.8 fL (ref 80.0–100.0)
Platelets: 241 10*3/uL (ref 150–400)
RBC: 3.85 MIL/uL — ABNORMAL LOW (ref 4.22–5.81)
RDW: 14 % (ref 11.5–15.5)
WBC: 8.3 10*3/uL (ref 4.0–10.5)
nRBC: 0 % (ref 0.0–0.2)

## 2020-06-07 LAB — LIPID PANEL
Cholesterol: 143 mg/dL (ref 0–200)
HDL: 53 mg/dL (ref >40)
LDL Cholesterol: 77 mg/dL (ref 0–99)
Total CHOL/HDL Ratio: 2.7 RATIO
Triglycerides: 65 mg/dL (ref <150)
VLDL: 13 mg/dL (ref 0–40)

## 2020-06-07 MED ORDER — POTASSIUM CHLORIDE 10 MEQ/100ML IV SOLN
10.0000 meq | INTRAVENOUS | Status: AC
  Administered 2020-06-07 (×4): 10 meq via INTRAVENOUS
  Filled 2020-06-07 (×4): qty 100

## 2020-06-07 MED ORDER — LACTATED RINGERS IV SOLN: INTRAVENOUS | Status: AC

## 2020-06-07 NOTE — Progress Notes (Signed)
Patient ID: [name] [name], male   DOB: Jun 28, 1990, 29 y.o.   MRN: [id number]  PROGRESS NOTE    [name] [name]  TIW:[id number] DOB: [birth year] DOA: 06/06/2020 PCP: Patient, No Pcp Per   Brief Narrative:  29 year old male with history of recurrent pancreatitis with last hospitalization at [facility] on 04/18/2020 possibly alcohol-related, bicuspid aortic valve with mild aortic stenosis presented with abdominal pain, nausea and vomiting on 06/06/2020.  On presentation, lipase was 198, AST of 50, ALT of 58, alkaline phosphatase 68, total bilirubin of 0.6, white blood cell count of 12.5. CT abdomen/pelvis with contrast showed acute edematous pancreatitis with moderate amount of peripancreatic fat stranding and acute peripancreatic fluid collection measuring 3.7 x 3.5 x 4.0 cm about the pancreatic head.  Changes of hepatic steatosis also noted.  He was started on IV fluids and analgesics.  Assessment & Plan:   Acute pancreatitis -Recurrent, third episode in the past year.  Prior episodes thought to be related to alcohol use.  Patient reports significant decrease in alcohol intake with 1 drink in the last 2 weeks -CT showed acute edematous pancreatitis with peripancreatic fluid collection in the pancreatic head.  Triglycerides normal. -Pain is improving.  Continue IV fluids and as needed antiemetics and analgesics.  Use oral narcotic as needed as well.  Start clear liquid diet and advance to full liquid diet for tonight.  If patient continues to improve, may try soft diet tomorrow and possible discharge tomorrow. -Right upper quadrant ultrasound was negative for gallstones. -Consider outpatient GI evaluation as well  Elevated LFTs -Possibly from above.  Improved.  Hypokalemia -Replace.  Repeat a.m. labs  DVT prophylaxis: Lovenox Code Status: Full Family Communication: None at bedside Disposition Plan: Status is: Observation  The patient will require care spanning > 2 midnights and should be  moved to inpatient because: Inpatient level of care appropriate due to severity of illness  Dispo: The patient is from: Home              Anticipated d/c is to: Home              Anticipated d/c date is: 1 day              Patient currently is not medically stable to d/c.  Consultants: None  Procedures: None  Antimicrobials: None   Subjective: Patient seen and examined at bedside.  He feels much improved since yesterday and states that his pain is 3 out of 10 currently.  Currently no vomiting.  Agreeable to trying clear liquid diet this morning.  No overnight fever reported.  Objective: Vitals:   06/06/20 2200 06/07/20 0012 06/07/20 0426 06/07/20 0814  BP: (!) 153/99 (!) 156/95 (!) 128/97 (!) 136/93  Pulse: 69 64 68 68  Resp: 16 17 18 18   Temp:  98.6 F (37 C) 98.1 F (36.7 C) 98.2 F (36.8 C)  TempSrc:  Oral Oral Oral  SpO2: 100% 98% 98% 98%  Weight:      Height:        Intake/Output Summary (Last 24 hours) at 06/07/2020 1037 Last data filed at 06/07/2020 0529 Gross per 24 hour  Intake 882.85 ml  Output 400 ml  Net 482.85 ml   Filed Weights   06/06/20 1345  Weight: 81.6 kg    Examination:  General exam: Appears calm and comfortable  Respiratory system: Bilateral decreased breath sounds at bases Cardiovascular system: S1 & S2 heard, Rate controlled Gastrointestinal system: Abdomen is nondistended, soft and mildly tender in  the periumbilical/epigastric regions. Normal bowel sounds heard. Extremities: No cyanosis, clubbing, edema  Central nervous system: Alert and oriented. No focal neurological deficits. Moving extremities Skin: No rashes, lesions or ulcers Psychiatry: Judgement and insight appear normal. Mood & affect appropriate.     Data Reviewed: I have personally reviewed following labs and imaging studies  CBC: Recent Labs  Lab 06/06/20 1357 06/07/20 0307  WBC 12.5* 8.3  HGB 14.1 11.9*  HCT 42.7 36.5*  MCV 93.4 94.8  PLT 326 241   Basic  Metabolic Panel: Recent Labs  Lab 06/06/20 1357 06/07/20 0307  NA 141 141  K 3.7 3.1*  CL 103 106  CO2 26 29  GLUCOSE 171* 105*  BUN 14 11  CREATININE 0.78 0.60*  CALCIUM 9.7 8.9   GFR: Estimated Creatinine Clearance: 145.1 mL/min (A) (by C-G formula based on SCr of 0.6 mg/dL (L)). Liver Function Tests: Recent Labs  Lab 06/06/20 1357 06/07/20 0307  AST 50* 24  ALT 58* 36  ALKPHOS 68 48  BILITOT 0.6 0.8  PROT 8.4* 6.6  ALBUMIN 5.1* 3.9   Recent Labs  Lab 06/06/20 1357  LIPASE 198*   No results for input(s): AMMONIA in the last 168 hours. Coagulation Profile: No results for input(s): INR, PROTIME in the last 168 hours. Cardiac Enzymes: No results for input(s): CKTOTAL, CKMB, CKMBINDEX, TROPONINI in the last 168 hours. BNP (last 3 results) No results for input(s): PROBNP in the last 8760 hours. HbA1C: No results for input(s): HGBA1C in the last 72 hours. CBG: No results for input(s): GLUCAP in the last 168 hours. Lipid Profile: Recent Labs    06/07/20 0307  CHOL 143  HDL 53  LDLCALC 77  TRIG 65  CHOLHDL 2.7   Thyroid Function Tests: No results for input(s): TSH, T4TOTAL, FREET4, T3FREE, THYROIDAB in the last 72 hours. Anemia Panel: No results for input(s): VITAMINB12, FOLATE, FERRITIN, TIBC, IRON, RETICCTPCT in the last 72 hours. Sepsis Labs: No results for input(s): PROCALCITON, LATICACIDVEN in the last 168 hours.  Recent Results (from the past 240 hour(s))  Resp Panel by RT-PCR (Flu A&B, Covid)     Status: None   Collection Time: 06/06/20  8:52 PM  Result Value Ref Range Status   SARS Coronavirus 2 by RT PCR NEGATIVE NEGATIVE Final    Comment: (NOTE) SARS-CoV-2 target nucleic acids are NOT DETECTED.  The SARS-CoV-2 RNA is generally detectable in upper respiratory specimens during the acute phase of infection. The lowest concentration of SARS-CoV-2 viral copies this assay can detect is 138 copies/mL. A negative result does not preclude  SARS-Cov-2 infection and should not be used as the sole basis for treatment or other patient management decisions. A negative result may occur with  improper specimen collection/handling, submission of specimen other than nasopharyngeal swab, presence of viral mutation(s) within the areas targeted by this assay, and inadequate number of viral copies(<138 copies/mL). A negative result must be combined with clinical observations, patient history, and epidemiological information. The expected result is Negative.  Fact Sheet for Patients:  BloggerCourse.comhttps://www.fda.gov/media/152166/download  Fact Sheet for Healthcare Providers:  SeriousBroker.ithttps://www.fda.gov/media/152162/download  This test is no t yet approved or cleared by the [place] FDA and  has been authorized for detection and/or diagnosis of SARS-CoV-2 by FDA under an Emergency Use Authorization (EUA). This EUA will remain  in effect (meaning this test can be used) for the duration of the COVID-19 declaration under Section 564(b)(1) of the Act, 21 U.S.C.section 360bbb-3(b)(1), unless the authorization is terminated  or  revoked sooner.       Influenza A by PCR NEGATIVE NEGATIVE Final   Influenza B by PCR NEGATIVE NEGATIVE Final    Comment: (NOTE) The Xpert Xpress SARS-CoV-2/FLU/RSV plus assay is intended as an aid in the diagnosis of influenza from Nasopharyngeal swab specimens and should not be used as a sole basis for treatment. Nasal washings and aspirates are unacceptable for Xpert Xpress SARS-CoV-2/FLU/RSV testing.  Fact Sheet for Patients: BloggerCourse.com  Fact Sheet for Healthcare Providers: SeriousBroker.it  This test is not yet approved or cleared by the [place] FDA and has been authorized for detection and/or diagnosis of SARS-CoV-2 by FDA under an Emergency Use Authorization (EUA). This EUA will remain in effect (meaning this test can be used) for the duration of  the COVID-19 declaration under Section 564(b)(1) of the Act, 21 U.S.C. section 360bbb-3(b)(1), unless the authorization is terminated or revoked.  Performed at [facility], [address] [place], [place] [address]          Radiology Studies: CT ABDOMEN PELVIS W CONTRAST  Result Date: 06/06/2020 CLINICAL DATA:  Left upper quadrant abdominal pain. History of pancreatitis. Patient reports severe back pain. EXAM: CT ABDOMEN AND PELVIS WITH CONTRAST TECHNIQUE: Multidetector CT imaging of the abdomen and pelvis was performed using the standard protocol following bolus administration of intravenous contrast. CONTRAST:  OMNIPAQUE IOHEXOL 300 MG/ML  SOLN COMPARISON:  None. FINDINGS: Lower chest: No focal airspace disease or pleural effusion. The heart is normal in size. Suggestion of aortic valvular calcifications, partially obscured by cardiac motion. Hepatobiliary: Diffusely decreased hepatic density consistent with steatosis. More focal fatty infiltration adjacent to the falciform ligament. No discrete focal hepatic lesion. Gallbladder physiologically distended, no calcified stone. No biliary dilatation. Pancreas: Moderate amount of peripancreatic fat stranding and edema adjacent to the head and uncinate process of the pancreas, tracking into the retroperitoneum. Adjacent ill-defined peripancreatic fluid abutting the stomach and duodenum. There is heterogeneous intrapancreatic collection measuring 3.7 x 3.5 x 4.0 cm, primarily cystic with some soft tissue nodularity peripherally. No internal air. No evidence of pancreatic necrosis. This causes mild mass effect and deformity of the superior mesenteric vein but no evidence of internal thrombus. Mid pancreatic ductal dilatation of 6 mm. Spleen: Normal in size without focal abnormality. Cleft posteriorly. Adrenals/Urinary Tract: Normal adrenal glands. No hydronephrosis or perinephric edema. Homogeneous renal enhancement. Urinary  bladder is nondistended, grossly normal. Stomach/Bowel: Peripancreatic inflammatory changes with ill-defined fluid extends to the Peri pyloric stomach and proximal duodenum. Low-density involving the duodenal bulb anteriorly, series 2, image 36, is presumably ill-defined wall thickening rather than discontinuity. Inflammatory changes extend adjacent to the second and third portion of the duodenum. No abnormal gastric distension. Remainder of the small bowel is decompressed. Appendix is tentatively but not definitively visualized and normal. There is no colonic wall thickening or inflammatory change. Majority of the colon is decompressed which limits assessment. Vascular/Lymphatic: Peripancreatic inflammatory changes cause mild mass effect and central deviation of the superior mesenteric vein but no evidence of venous thrombus. The portal and splenic veins are patent. Multiple small central mesenteric, peripancreatic, and upper retroperitoneal lymph nodes are presumably reactive. Normal caliber abdominal aorta. Reproductive: Prostate is unremarkable. Other: Peripancreatic fat stranding with ill-defined fluid, inflammatory changes track distally in the right retroperitoneum with non organized free fluid in the right pericolic gutter. Small amount of mesenteric free fluid which tracks into the pelvis. There is no free intra-abdominal air. Musculoskeletal: There are no acute or suspicious osseous abnormalities.  IMPRESSION: 1. Acute edematous pancreatitis with moderate amount of peripancreatic fat stranding and acute peripancreatic fluid collection measuring 3.7 x 3.5 x 4.0 cm about the pancreatic head. This causes mild mass effect and deformity of the superior mesenteric vein but no evidence of internal thrombus. 2. Low-density involving the duodenal bulb anteriorly is presumably ill-defined wall thickening and fluid rather than discontinuity. 3. Hepatic steatosis. 4. Aortic valve calcifications are partially included  in the lower thorax. Consider further evaluation with echocardiogram. Electronically Signed   By: Narda Rutherford M.D.   On: 06/06/2020 19:35   US Abdomen Limited RUQ (LIVER/GB)  Result Date: 06/06/2020 CLINICAL DATA:  Acute pancreatitis EXAM: ULTRASOUND ABDOMEN LIMITED RIGHT UPPER QUADRANT COMPARISON:  None. FINDINGS: Gallbladder: No gallstones or wall thickening visualized. No sonographic Murphy sign noted by sonographer. Common bile duct: Diameter: 3.9 mm Liver: Increased echotexture seen throughout. No focal abnormality or biliary ductal dilatation. Portal vein is patent on color Doppler imaging with normal direction of blood flow towards the liver. Other: None. IMPRESSION: Mild hepatic steatosis. Normal appearing gallbladder. Electronically Signed   By: Jonna Clark M.D.   On: 06/06/2020 22:14        Scheduled Meds: . enoxaparin (LOVENOX) injection  40 mg Subcutaneous Q24H   Continuous Infusions: . potassium chloride 10 mEq (06/07/20 0927)          Glade Lloyd, MD [facility] 06/07/2020, 10:37 AM

## 2020-06-07 NOTE — Plan of Care (Signed)

## 2020-06-08 DIAGNOSIS — R7401 Elevation of levels of liver transaminase levels: Secondary | ICD-10-CM

## 2020-06-08 DIAGNOSIS — K859 Acute pancreatitis without necrosis or infection, unspecified: Secondary | ICD-10-CM | POA: Diagnosis not present

## 2020-06-08 DIAGNOSIS — I359 Nonrheumatic aortic valve disorder, unspecified: Secondary | ICD-10-CM

## 2020-06-08 DIAGNOSIS — E876 Hypokalemia: Secondary | ICD-10-CM | POA: Clinically undetermined

## 2020-06-08 HISTORY — DX: Hypokalemia: E87.6

## 2020-06-08 HISTORY — DX: Nonrheumatic aortic valve disorder, unspecified: I35.9

## 2020-06-08 HISTORY — DX: Elevation of levels of liver transaminase levels: R74.01

## 2020-06-08 LAB — CBC WITH DIFFERENTIAL/PLATELET
Abs Immature Granulocytes: 0.02 10*3/uL (ref 0.00–0.07)
Basophils Absolute: 0 10*3/uL (ref 0.0–0.1)
Basophils Relative: 0 %
Eosinophils Absolute: 0.3 10*3/uL (ref 0.0–0.5)
Eosinophils Relative: 4 %
HCT: 37.5 % — ABNORMAL LOW (ref 39.0–52.0)
Hemoglobin: 12.3 g/dL — ABNORMAL LOW (ref 13.0–17.0)
Immature Granulocytes: 0 %
Lymphocytes Relative: 25 %
Lymphs Abs: 2 10*3/uL (ref 0.7–4.0)
MCH: 31.4 pg (ref 26.0–34.0)
MCHC: 32.8 g/dL (ref 30.0–36.0)
MCV: 95.7 fL (ref 80.0–100.0)
Monocytes Absolute: 0.9 10*3/uL (ref 0.1–1.0)
Monocytes Relative: 11 %
Neutro Abs: 4.8 10*3/uL (ref 1.7–7.7)
Neutrophils Relative %: 60 %
Platelets: 224 10*3/uL (ref 150–400)
RBC: 3.92 MIL/uL — ABNORMAL LOW (ref 4.22–5.81)
RDW: 13.9 % (ref 11.5–15.5)
WBC: 8 10*3/uL (ref 4.0–10.5)
nRBC: 0 % (ref 0.0–0.2)

## 2020-06-08 LAB — COMPREHENSIVE METABOLIC PANEL
ALT: 31 U/L (ref 0–44)
AST: 20 U/L (ref 15–41)
Albumin: 4.1 g/dL (ref 3.5–5.0)
Alkaline Phosphatase: 47 U/L (ref 38–126)
Anion gap: 9 (ref 5–15)
BUN: 8 mg/dL (ref 6–20)
CO2: 25 mmol/L (ref 22–32)
Calcium: 9.1 mg/dL (ref 8.9–10.3)
Chloride: 104 mmol/L (ref 98–111)
Creatinine, Ser: 0.71 mg/dL (ref 0.61–1.24)
GFR, Estimated: >60 mL/min (ref >60)
Glucose, Bld: 97 mg/dL (ref 70–99)
Potassium: 3.5 mmol/L (ref 3.5–5.1)
Sodium: 138 mmol/L (ref 135–145)
Total Bilirubin: 0.8 mg/dL (ref 0.3–1.2)
Total Protein: 6.7 g/dL (ref 6.5–8.1)

## 2020-06-08 LAB — HIV ANTIBODY (ROUTINE TESTING W REFLEX): HIV Screen 4th Generation wRfx: NONREACTIVE

## 2020-06-08 LAB — MAGNESIUM: Magnesium: 2.1 mg/dL (ref 1.7–2.4)

## 2020-06-08 MED ORDER — POTASSIUM CHLORIDE CRYS ER 20 MEQ PO TBCR
40.0000 meq | EXTENDED_RELEASE_TABLET | Freq: Once | ORAL | Status: AC
  Administered 2020-06-08: 09:00:00 40 meq via ORAL
  Filled 2020-06-08: qty 2

## 2020-06-08 MED ORDER — SODIUM CHLORIDE 0.9 % IV BOLUS
1000.0000 mL | Freq: Once | INTRAVENOUS | Status: AC
  Administered 2020-06-08: 18:00:00 1000 mL via INTRAVENOUS

## 2020-06-08 MED ORDER — SODIUM CHLORIDE 0.9 % IV SOLN: INTRAVENOUS | Status: DC

## 2020-06-08 NOTE — Progress Notes (Signed)
PROGRESS NOTE    [name] [name]  ZOX:[id number] DOB: Apr 24, 1991 DOA: 06/06/2020 PCP: Patient, No Pcp Per    Chief Complaint  Patient presents with  . Back Pain  . Abdominal Pain    Brief Narrative:  29 year old male with history of recurrent pancreatitis with last hospitalization at [facility] on 04/18/2020 possibly alcohol-related, bicuspid aortic valve with mild aortic stenosis presented with abdominal pain, nausea and vomiting on 06/06/2020.  On presentation, lipase was 198, AST of 50, ALT of 58, alkaline phosphatase 68, total bilirubin of 0.6, white blood cell count of 12.5. CT abdomen/pelvis with contrast showed acute edematous pancreatitis with moderate amount of peripancreatic fat stranding and acute peripancreatic fluid collection measuring 3.7 x 3.5 x 4.0 cm about the pancreatic head. Changes of hepatic steatosis also noted.  He was started on IV fluids and analgesics.   Assessment & Plan:   Principal Problem:   Acute pancreatitis Active Problems:   Transaminitis   Hypokalemia   Aortic valve calcification  1 acute pancreatitis Patient with recurrent acute pancreatitis third episode over this past year.  Prior episodes noted to be related to alcohol use.  Patient reported significant decrease in alcohol intake with 1 drink over the past 2 weeks. Lipase levels noted on admission elevated at 198.  Right upper quadrant ultrasound negative for gallstones.  Fasting lipid panel with a triglyceride level of 65.  CT abdomen and pelvis with acute edematous pancreatitis with moderate amount of peripancreatic fat stranding and acute peripancreatic fluid collection measuring 3.7 x 3.5 x 4.0 about the pancreatic head causing mild mass-effect and deformity of the superior mesenteric vein but no evidence of internal thrombus.  Low-density involving duodenal bulb anteriorly presumably ill-defined wall thickening and fluid rather than discontinued E.  Hepatic steatosis.  Aortic valve  calcifications are partially included in lower thorax.  Patient with some clinical improvement.  Patient advanced to a soft diet however still with significant abdominal pain with oral intake.  Downgrade diet back to a full liquid diet.  1 L fluid bolus.  Place on normal saline 150 cc an hour.  Repeat labs in the morning.  Reassess.  Will need outpatient follow-up with GI.  Follow.  2.  Transaminitis  likely secondary to problem #1 and hepatic steatosis noted on CT abdomen and pelvis.  Improved.  3.  Hypokalemia Potassium at 3.5.  Magnesium at 2.1.  K. Dur 40 mEq p.o. x1.  4.  Aortic valve calcifications noted on CT abdomen and pelvis 2D echo.  May need to be started on a statin however will defer to the outpatient setting.   DVT prophylaxis: Lovenox Code Status: Full Family Communication: Updated patient.  No family at bedside. Disposition:   Status is: Inpatient    Dispo: The patient is from: Home              Anticipated d/c is to: Home              Anticipated d/c date is: 1 to 2 days              Patient currently ongoing abdominal pain, acute pancreatitis.  Not stable for discharge.       Consultants:   None  Procedures:   CT abdomen and pelvis 06/06/2020  Right upper quadrant ultrasound 06/06/2020  Antimicrobials:   None   Subjective: Sitting up in chair.  Overall states improved since admission.  Tolerated full liquid diet.  Try soft diet for breakfast however stated had epigastric  pain radiating to the back.  Tried soft diet for lunch and noted to have similar abdominal pain.  No nausea or emesis.  No chest pain.  No shortness of breath.  Objective: Vitals:   06/07/20 1342 06/07/20 2229 06/08/20 0516 06/08/20 1343  BP: (!) 121/92 (!) 131/91 121/87 (!) 154/98  Pulse: 61 66 73 61  Resp: 19 16 16 16   Temp: (!) 97.5 F (36.4 C) 98.8 F (37.1 C) 98 F (36.7 C) 98.2 F (36.8 C)  TempSrc: Oral Oral Oral Oral  SpO2: 99% 98% 97% 100%  Weight:      Height:         Intake/Output Summary (Last 24 hours) at 06/08/2020 1731 Last data filed at 06/08/2020 1434 Gross per 24 hour  Intake 2080 ml  Output --  Net 2080 ml   Filed Weights   06/06/20 1345  Weight: 81.6 kg    Examination:  General exam: Appears calm and comfortable  Respiratory system: Clear to auscultation. Respiratory effort normal. Cardiovascular system: S1 & S2 heard, RRR. No JVD, murmurs, rubs, gallops or clicks. No pedal edema. Gastrointestinal system: Abdomen is nondistended, soft and some tenderness to palpation in the epigastrium.  Positive bowel sounds.  No rebound.  No guarding.  Central nervous system: Alert and oriented. No focal neurological deficits. Extremities: Symmetric 5 x 5 power. Skin: No rashes, lesions or ulcers Psychiatry: Judgement and insight appear normal. Mood & affect appropriate.     Data Reviewed: I have personally reviewed following labs and imaging studies  CBC: Recent Labs  Lab 06/06/20 1357 06/07/20 0307 06/08/20 0302  WBC 12.5* 8.3 8.0  NEUTROABS  --   --  4.8  HGB 14.1 11.9* 12.3*  HCT 42.7 36.5* 37.5*  MCV 93.4 94.8 95.7  PLT 326 241 224    Basic Metabolic Panel: Recent Labs  Lab 06/06/20 1357 06/07/20 0307 06/08/20 0302  NA 141 141 138  K 3.7 3.1* 3.5  CL 103 106 104  CO2 26 29 25   GLUCOSE 171* 105* 97  BUN 14 11 8   CREATININE 0.78 0.60* 0.71  CALCIUM 9.7 8.9 9.1  MG  --   --  2.1    GFR: Estimated Creatinine Clearance: 145.1 mL/min (by C-G formula based on SCr of 0.71 mg/dL).  Liver Function Tests: Recent Labs  Lab 06/06/20 1357 06/07/20 0307 06/08/20 0302  AST 50* 24 20  ALT 58* 36 31  ALKPHOS 68 48 47  BILITOT 0.6 0.8 0.8  PROT 8.4* 6.6 6.7  ALBUMIN 5.1* 3.9 4.1    CBG: No results for input(s): GLUCAP in the last 168 hours.   Recent Results (from the past 240 hour(s))  Resp Panel by RT-PCR (Flu A&B, Covid)     Status: None   Collection Time: 06/06/20  8:52 PM  Result Value Ref Range Status    SARS Coronavirus 2 by RT PCR NEGATIVE NEGATIVE Final    Comment: (NOTE) SARS-CoV-2 target nucleic acids are NOT DETECTED.  The SARS-CoV-2 RNA is generally detectable in upper respiratory specimens during the acute phase of infection. The lowest concentration of SARS-CoV-2 viral copies this assay can detect is 138 copies/mL. A negative result does not preclude SARS-Cov-2 infection and should not be used as the sole basis for treatment or other patient management decisions. A negative result may occur with  improper specimen collection/handling, submission of specimen other than nasopharyngeal swab, presence of viral mutation(s) within the areas targeted by this assay, and inadequate number of viral copies(<138  copies/mL). A negative result must be combined with clinical observations, patient history, and epidemiological information. The expected result is Negative.  Fact Sheet for Patients:  BloggerCourse.comhttps://www.fda.gov/media/152166/download  Fact Sheet for Healthcare Providers:  SeriousBroker.ithttps://www.fda.gov/media/152162/download  This test is no t yet approved or cleared by the [place] FDA and  has been authorized for detection and/or diagnosis of SARS-CoV-2 by FDA under an Emergency Use Authorization (EUA). This EUA will remain  in effect (meaning this test can be used) for the duration of the COVID-19 declaration under Section 564(b)(1) of the Act, 21 U.S.C.section 360bbb-3(b)(1), unless the authorization is terminated  or revoked sooner.       Influenza A by PCR NEGATIVE NEGATIVE Final   Influenza B by PCR NEGATIVE NEGATIVE Final    Comment: (NOTE) The Xpert Xpress SARS-CoV-2/FLU/RSV plus assay is intended as an aid in the diagnosis of influenza from Nasopharyngeal swab specimens and should not be used as a sole basis for treatment. Nasal washings and aspirates are unacceptable for Xpert Xpress SARS-CoV-2/FLU/RSV testing.  Fact Sheet for  Patients: BloggerCourse.comhttps://www.fda.gov/media/152166/download  Fact Sheet for Healthcare Providers: SeriousBroker.ithttps://www.fda.gov/media/152162/download  This test is not yet approved or cleared by the [place] FDA and has been authorized for detection and/or diagnosis of SARS-CoV-2 by FDA under an Emergency Use Authorization (EUA). This EUA will remain in effect (meaning this test can be used) for the duration of the COVID-19 declaration under Section 564(b)(1) of the Act, 21 U.S.C. section 360bbb-3(b)(1), unless the authorization is terminated or revoked.  Performed at [facility], [address] [place], [place] [id number]          Radiology Studies: CT ABDOMEN PELVIS W CONTRAST  Result Date: 06/06/2020 CLINICAL DATA:  Left upper quadrant abdominal pain. History of pancreatitis. Patient reports severe back pain. EXAM: CT ABDOMEN AND PELVIS WITH CONTRAST TECHNIQUE: Multidetector CT imaging of the abdomen and pelvis was performed using the standard protocol following bolus administration of intravenous contrast. CONTRAST:  100mL OMNIPAQUE IOHEXOL 300 MG/ML  SOLN COMPARISON:  None. FINDINGS: Lower chest: No focal airspace disease or pleural effusion. The heart is normal in size. Suggestion of aortic valvular calcifications, partially obscured by cardiac motion. Hepatobiliary: Diffusely decreased hepatic density consistent with steatosis. More focal fatty infiltration adjacent to the falciform ligament. No discrete focal hepatic lesion. Gallbladder physiologically distended, no calcified stone. No biliary dilatation. Pancreas: Moderate amount of peripancreatic fat stranding and edema adjacent to the head and uncinate process of the pancreas, tracking into the retroperitoneum. Adjacent ill-defined peripancreatic fluid abutting the stomach and duodenum. There is heterogeneous intrapancreatic collection measuring 3.7 x 3.5 x 4.0 cm, primarily cystic with some soft tissue nodularity  peripherally. No internal air. No evidence of pancreatic necrosis. This causes mild mass effect and deformity of the superior mesenteric vein but no evidence of internal thrombus. Mid pancreatic ductal dilatation of 6 mm. Spleen: Normal in size without focal abnormality. Cleft posteriorly. Adrenals/Urinary Tract: Normal adrenal glands. No hydronephrosis or perinephric edema. Homogeneous renal enhancement. Urinary bladder is nondistended, grossly normal. Stomach/Bowel: Peripancreatic inflammatory changes with ill-defined fluid extends to the Peri pyloric stomach and proximal duodenum. Low-density involving the duodenal bulb anteriorly, series 2, image 36, is presumably ill-defined wall thickening rather than discontinuity. Inflammatory changes extend adjacent to the second and third portion of the duodenum. No abnormal gastric distension. Remainder of the small bowel is decompressed. Appendix is tentatively but not definitively visualized and normal. There is no colonic wall thickening or inflammatory change. Majority of the colon is  decompressed which limits assessment. Vascular/Lymphatic: Peripancreatic inflammatory changes cause mild mass effect and central deviation of the superior mesenteric vein but no evidence of venous thrombus. The portal and splenic veins are patent. Multiple small central mesenteric, peripancreatic, and upper retroperitoneal lymph nodes are presumably reactive. Normal caliber abdominal aorta. Reproductive: Prostate is unremarkable. Other: Peripancreatic fat stranding with ill-defined fluid, inflammatory changes track distally in the right retroperitoneum with non organized free fluid in the right pericolic gutter. Small amount of mesenteric free fluid which tracks into the pelvis. There is no free intra-abdominal air. Musculoskeletal: There are no acute or suspicious osseous abnormalities. IMPRESSION: 1. Acute edematous pancreatitis with moderate amount of peripancreatic fat stranding and  acute peripancreatic fluid collection measuring 3.7 x 3.5 x 4.0 cm about the pancreatic head. This causes mild mass effect and deformity of the superior mesenteric vein but no evidence of internal thrombus. 2. Low-density involving the duodenal bulb anteriorly is presumably ill-defined wall thickening and fluid rather than discontinuity. 3. Hepatic steatosis. 4. Aortic valve calcifications are partially included in the lower thorax. Consider further evaluation with echocardiogram. Electronically Signed   By: Narda Rutherford M.D.   On: 06/06/2020 19:35   US Abdomen Limited RUQ (LIVER/GB)  Result Date: 06/06/2020 CLINICAL DATA:  Acute pancreatitis EXAM: ULTRASOUND ABDOMEN LIMITED RIGHT UPPER QUADRANT COMPARISON:  None. FINDINGS: Gallbladder: No gallstones or wall thickening visualized. No sonographic Murphy sign noted by sonographer. Common bile duct: Diameter: 3.9 mm Liver: Increased echotexture seen throughout. No focal abnormality or biliary ductal dilatation. Portal vein is patent on color Doppler imaging with normal direction of blood flow towards the liver. Other: None. IMPRESSION: Mild hepatic steatosis. Normal appearing gallbladder. Electronically Signed   By: Jonna Clark M.D.   On: 06/06/2020 22:14        Scheduled Meds: . enoxaparin (LOVENOX) injection  40 mg Subcutaneous Q24H   Continuous Infusions: . sodium chloride    . sodium chloride       LOS: 1 day    Time spent: 35 minutes    Ramiro Harvest, MD [facility]   To contact the attending provider between 7A-7P or the covering provider during after hours 7P-7A, please log into the web site [email or web address] and access using universal [place] password for that web site. If you do not have the password, please call the hospital operator.  06/08/2020, 5:31 PM

## 2020-06-09 ENCOUNTER — Inpatient Hospital Stay (HOSPITAL_COMMUNITY): Payer: Commercial Managed Care - PPO

## 2020-06-09 DIAGNOSIS — I361 Nonrheumatic tricuspid (valve) insufficiency: Secondary | ICD-10-CM | POA: Diagnosis not present

## 2020-06-09 DIAGNOSIS — I351 Nonrheumatic aortic (valve) insufficiency: Secondary | ICD-10-CM

## 2020-06-09 DIAGNOSIS — E876 Hypokalemia: Secondary | ICD-10-CM | POA: Diagnosis not present

## 2020-06-09 DIAGNOSIS — R7401 Elevation of levels of liver transaminase levels: Secondary | ICD-10-CM | POA: Diagnosis not present

## 2020-06-09 DIAGNOSIS — I350 Nonrheumatic aortic (valve) stenosis: Secondary | ICD-10-CM

## 2020-06-09 DIAGNOSIS — I359 Nonrheumatic aortic valve disorder, unspecified: Secondary | ICD-10-CM | POA: Diagnosis not present

## 2020-06-09 DIAGNOSIS — K859 Acute pancreatitis without necrosis or infection, unspecified: Secondary | ICD-10-CM | POA: Diagnosis not present

## 2020-06-09 LAB — CBC WITH DIFFERENTIAL/PLATELET
Abs Immature Granulocytes: 0.01 10*3/uL (ref 0.00–0.07)
Basophils Absolute: 0 10*3/uL (ref 0.0–0.1)
Basophils Relative: 0 %
Eosinophils Absolute: 0.2 10*3/uL (ref 0.0–0.5)
Eosinophils Relative: 2 %
HCT: 37.3 % — ABNORMAL LOW (ref 39.0–52.0)
Hemoglobin: 12.1 g/dL — ABNORMAL LOW (ref 13.0–17.0)
Immature Granulocytes: 0 %
Lymphocytes Relative: 25 %
Lymphs Abs: 2 10*3/uL (ref 0.7–4.0)
MCH: 31 pg (ref 26.0–34.0)
MCHC: 32.4 g/dL (ref 30.0–36.0)
MCV: 95.6 fL (ref 80.0–100.0)
Monocytes Absolute: 1 10*3/uL (ref 0.1–1.0)
Monocytes Relative: 13 %
Neutro Abs: 4.7 10*3/uL (ref 1.7–7.7)
Neutrophils Relative %: 60 %
Platelets: 233 10*3/uL (ref 150–400)
RBC: 3.9 MIL/uL — ABNORMAL LOW (ref 4.22–5.81)
RDW: 13.6 % (ref 11.5–15.5)
WBC: 8 10*3/uL (ref 4.0–10.5)
nRBC: 0 % (ref 0.0–0.2)

## 2020-06-09 LAB — COMPREHENSIVE METABOLIC PANEL
ALT: 24 U/L (ref 0–44)
AST: 16 U/L (ref 15–41)
Albumin: 4 g/dL (ref 3.5–5.0)
Alkaline Phosphatase: 48 U/L (ref 38–126)
Anion gap: 8 (ref 5–15)
BUN: 7 mg/dL (ref 6–20)
CO2: 26 mmol/L (ref 22–32)
Calcium: 8.9 mg/dL (ref 8.9–10.3)
Chloride: 105 mmol/L (ref 98–111)
Creatinine, Ser: 0.73 mg/dL (ref 0.61–1.24)
GFR, Estimated: >60 mL/min (ref >60)
Glucose, Bld: 104 mg/dL — ABNORMAL HIGH (ref 70–99)
Potassium: 3.7 mmol/L (ref 3.5–5.1)
Sodium: 139 mmol/L (ref 135–145)
Total Bilirubin: 0.6 mg/dL (ref 0.3–1.2)
Total Protein: 6.9 g/dL (ref 6.5–8.1)

## 2020-06-09 LAB — ECHOCARDIOGRAM COMPLETE
AR max vel: 1.51 cm2
AV Area VTI: 1.39 cm2
AV Area mean vel: 1.34 cm2
AV Mean grad: 16 mmHg
AV Peak grad: 32.9 mmHg
Ao pk vel: 2.87 m/s
Area-P 1/2: 2.66 cm2
Height: 71 in
P 1/2 time: 470 msec
S' Lateral: 2.6 cm
Weight: 2880 oz

## 2020-06-09 LAB — LIPASE, BLOOD: Lipase: 52 U/L — ABNORMAL HIGH (ref 11–51)

## 2020-06-09 LAB — MAGNESIUM: Magnesium: 2 mg/dL (ref 1.7–2.4)

## 2020-06-09 MED ORDER — PROSOURCE PLUS PO LIQD: 30.0000 mL | Freq: Two times a day (BID) | ORAL | Status: DC

## 2020-06-09 MED ORDER — SENNOSIDES-DOCUSATE SODIUM 8.6-50 MG PO TABS: 1.0000 | ORAL_TABLET | Freq: Every evening | ORAL | Status: DC | PRN

## 2020-06-09 MED ORDER — SENNOSIDES-DOCUSATE SODIUM 8.6-50 MG PO TABS
1.0000 | ORAL_TABLET | Freq: Two times a day (BID) | ORAL | Status: DC
  Administered 2020-06-09: 14:00:00 1 via ORAL
  Filled 2020-06-09: qty 1

## 2020-06-09 MED ORDER — POLYETHYLENE GLYCOL 3350 17 G PO PACK
17.0000 g | PACK | Freq: Two times a day (BID) | ORAL | Status: DC
  Administered 2020-06-09: 14:00:00 17 g via ORAL
  Filled 2020-06-09: qty 1

## 2020-06-09 MED ORDER — ADULT MULTIVITAMIN W/MINERALS CH: 1.0000 | ORAL_TABLET | Freq: Every day | ORAL | Status: DC

## 2020-06-09 MED ORDER — ADULT MULTIVITAMIN W/MINERALS CH
1.0000 | ORAL_TABLET | Freq: Every day | ORAL | Status: DC
  Administered 2020-06-09: 15:00:00 1 via ORAL
  Filled 2020-06-09: qty 1

## 2020-06-09 MED ORDER — POLYETHYLENE GLYCOL 3350 17 G PO PACK: 17.0000 g | PACK | Freq: Every day | ORAL | 0 refills | Status: DC | PRN

## 2020-06-09 NOTE — Discharge Summary (Signed)
Physician Discharge Summary  [name] [name] FYB:[id number] DOB: February 27, 1991 DOA: 06/06/2020  PCP: Patient, No Pcp Per  Admit date: 06/06/2020 Discharge date: 06/09/2020  Time spent: 50 minutes  Recommendations for Outpatient Follow-up:  1. Patient is to follow-up with PCP in 2 weeks.  On follow-up patient need a comprehensive metabolic profile done to follow-up on electrolytes and renal function.  Patient will benefit from outpatient referral to cardiology to follow-up on his bicuspid aortic valve.   Discharge Diagnoses:  Principal Problem:   Acute pancreatitis Active Problems:   Transaminitis   Hypokalemia   Aortic valve calcification   Discharge Condition: Stable and improved  Diet recommendation: Heart healthy diet/soft diet  Filed Weights   06/06/20 1345  Weight: 81.6 kg    History of present illness:  HPI per Dr. Kandis Ban [name] [name] is a 29 y.o. male with medical history significant for pancreatitis, bicuspid aortic valve with mild aortic stenosis who presents to the ED for evaluation of abdominal pain, nausea, and vomiting.  Patient previously hospitalized at [facility] 08/17/2019-08/20/2019 for acute pancreatitis which at the time was felt related to alcohol use.  Lipase was 188.  CT abdomen/pelvis that admission showed phlegmonous change along the pancreatic head in addition to the acute pancreatitis findings.  Patient was managed with IV fluids, pain control and discharged home.  Patient was seen again in [facility] 04/18/2020 and again diagnosed with acute pancreatitis with lipase up to 1549.  This was again felt related to alcohol use and patient was able to discharge to home after initial management with IV fluids, antiemetics, and pain control.  Patient stated he was in his usual state of health until last night when he developed significant mid back pain.  He was able to tolerate at home and went to sleep.  The morning of admission, he woke with worsening  of the back pain which radiated to the front of his abdomen across the epigastric region.  Pain was severe and up to 10/10 in intensity.  He tried to relieve it at home by taking ibuprofen, Tylenol, and CBD without relief.  He developed associated nausea and vomiting.  He had some chills as well as some pain with deep inspiration.  He came to the ED for further evaluation and management.  Patient stated that since his last ED visit he had been significantly limiting his alcohol intake and working on dietary changes.  He says Christmas day he had increased fatty food intake and 1 drink of alcohol but no other alcohol use in the last 2 weeks.  He stated that the only medication he takes on a regular basis is Claritin.  Otherwise he uses ibuprofen about once a week.  He reports a family history of nonalcoholic liver cirrhosis in his maternal grandfather.  He denies any tobacco use, cocaine use, or injection drug use.   ED Course:  Initial vitals showed BP 161/105, pulse 60, RR 22, SPO2 100% on room air.  Labs show lipase 198, sodium 141, potassium 3.7, bicarb 26, BUN 14, creatinine 0.78, serum glucose 171, AST 50, ALT 58, alk phos 68, total bilirubin 0.6, WBC 12.5, hemoglobin 14.1, platelets 326,000.  CT abdomen/pelvis with contrast shows acute edematous pancreatitis with moderate amount of peripancreatic fat stranding and acute peripancreatic fluid collection measuring 3.7 x 3.5 x 4.0 cm about the pancreatic head.  Changes of hepatic steatosis also noted.  Patient was given 2 L normal saline, IV morphine 4 mg, and IV Zofran.  The  hospitalist service was consulted to admit for further evaluation and management.  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  Prior episodes noted to be related to alcohol use.  Patient reported significant decrease in alcohol intake with 1 drink over the past 2 weeks. Lipase levels noted on  admission elevated at 198.  Right upper quadrant ultrasound negative for gallstones.  Fasting lipid panel with a triglyceride level of 65.  CT abdomen and pelvis with acute edematous pancreatitis with moderate amount of peripancreatic fat stranding and acute peripancreatic fluid collection measuring 3.7 x 3.5 x 4.0 about the pancreatic head causing mild mass-effect and deformity of the superior mesenteric vein but no evidence of internal thrombus.  Low-density involving duodenal bulb anteriorly presumably ill-defined wall thickening and fluid rather than discontinued E.  Hepatic steatosis.  Aortic valve calcifications are partially included in lower thorax.  Patient hydrated aggressively with IV fluids and initially improved clinically.  Patient was started on diet and diet advanced to a soft diet however patient had some significant abdominal pain with a soft diet and diet was downgraded back to full liquid diet.  Patient given a liter bolus of IV fluid and hydrated aggressively for the next 24 hours prior to admission.  Patient improved clinically and a soft diet was reattempted which patient tolerated without any further abdominal pain.  Patient be discharged home in stable and improved condition and is to follow-up with PCP in the outpatient setting.   2.  Transaminitis  likely secondary to problem #1 and hepatic steatosis noted on CT abdomen and pelvis.  Transaminitis improved and had resolved by day of discharge.  3.  Hypokalemia Repleted during the hospitalization.  Potassium was 3.7 by day of discharge and magnesium at 2.0.  Outpatient follow-up with PCP.   4.  Aortic valve calcifications noted on CT abdomen and pelvis 2D echo done with a EF of 60 to 65%, no wall motion abnormalities, mild LVH, bicuspid aortic valve, mild calcification of the aorta, moderate thickening of aortic valve, moderate aortic valvular regurgitation, mild aortic valve stenosis.  Patient however did state this was  congenital and has had a bicuspid aortic valve since childhood.  Patient remained asymptomatic.  Patient will need outpatient follow-up with cardiology.  Procedures:  CT abdomen and pelvis 06/06/2020  Right upper quadrant ultrasound 06/06/2020  2D echo 06/09/2020  Consultations:  None  Discharge Exam: Vitals:   06/09/20 0504 06/09/20 1403  BP: 120/76 125/90  Pulse: 61 75  Resp: 16 18  Temp: 98.3 F (36.8 C) 97.9 F (36.6 C)  SpO2: 98% 99%    General: NAD Cardiovascular: RRR Respiratory: CTAB GI: Abdomen is soft, nontender, nondistended, positive bowel sounds.  Discharge Instructions   Discharge Instructions    Diet - low sodium heart healthy   Complete by: As directed    Increase activity slowly   Complete by: As directed      Allergies as of 06/09/2020      Reactions   Amoxicillin Hives      Medication List    STOP taking these medications   oxyCODONE 5 MG immediate release tablet Commonly known as: Oxy IR/ROXICODONE     TAKE these medications   albuterol 108 (90 Base) MCG/ACT inhaler Commonly known as: VENTOLIN HFA Inhale 2 puffs into the lungs every 6 (six) hours as needed for wheezing or shortness of breath.   loratadine 10 MG tablet Commonly known as: CLARITIN Take  10 mg by mouth daily.   multivitamin with minerals Tabs tablet Take 1 tablet by mouth daily. Start taking on: June 10, 2020   ondansetron 4 MG disintegrating tablet Commonly known as: Zofran ODT Take 1 tablet (4 mg total) by mouth every 8 (eight) hours as needed for nausea or vomiting.   polyethylene glycol 17 g packet Commonly known as: MIRALAX / GLYCOLAX Take 17 g by mouth daily as needed.   senna-docusate 8.6-50 MG tablet Commonly known as: Senokot-S Take 1 tablet by mouth at bedtime as needed for mild constipation.      Allergies  Allergen Reactions  . Amoxicillin Hives    Follow-up Information    PCP. Schedule an appointment as soon as possible for a visit  in 2 week(s).                The results of significant diagnostics from this hospitalization (including imaging, microbiology, ancillary and laboratory) are listed below for reference.    Significant Diagnostic Studies: CT ABDOMEN PELVIS W CONTRAST  Result Date: 06/06/2020 CLINICAL DATA:  Left upper quadrant abdominal pain. History of pancreatitis. Patient reports severe back pain. EXAM: CT ABDOMEN AND PELVIS WITH CONTRAST TECHNIQUE: Multidetector CT imaging of the abdomen and pelvis was performed using the standard protocol following bolus administration of intravenous contrast. CONTRAST:  115mL OMNIPAQUE IOHEXOL 300 MG/ML  SOLN COMPARISON:  None. FINDINGS: Lower chest: No focal airspace disease or pleural effusion. The heart is normal in size. Suggestion of aortic valvular calcifications, partially obscured by cardiac motion. Hepatobiliary: Diffusely decreased hepatic density consistent with steatosis. More focal fatty infiltration adjacent to the falciform ligament. No discrete focal hepatic lesion. Gallbladder physiologically distended, no calcified stone. No biliary dilatation. Pancreas: Moderate amount of peripancreatic fat stranding and edema adjacent to the head and uncinate process of the pancreas, tracking into the retroperitoneum. Adjacent ill-defined peripancreatic fluid abutting the stomach and duodenum. There is heterogeneous intrapancreatic collection measuring 3.7 x 3.5 x 4.0 cm, primarily cystic with some soft tissue nodularity peripherally. No internal air. No evidence of pancreatic necrosis. This causes mild mass effect and deformity of the superior mesenteric vein but no evidence of internal thrombus. Mid pancreatic ductal dilatation of 6 mm. Spleen: Normal in size without focal abnormality. Cleft posteriorly. Adrenals/Urinary Tract: Normal adrenal glands. No hydronephrosis or perinephric edema. Homogeneous renal enhancement. Urinary bladder is nondistended, grossly normal.  Stomach/Bowel: Peripancreatic inflammatory changes with ill-defined fluid extends to the Peri pyloric stomach and proximal duodenum. Low-density involving the duodenal bulb anteriorly, series 2, image 36, is presumably ill-defined wall thickening rather than discontinuity. Inflammatory changes extend adjacent to the second and third portion of the duodenum. No abnormal gastric distension. Remainder of the small bowel is decompressed. Appendix is tentatively but not definitively visualized and normal. There is no colonic wall thickening or inflammatory change. Majority of the colon is decompressed which limits assessment. Vascular/Lymphatic: Peripancreatic inflammatory changes cause mild mass effect and central deviation of the superior mesenteric vein but no evidence of venous thrombus. The portal and splenic veins are patent. Multiple small central mesenteric, peripancreatic, and upper retroperitoneal lymph nodes are presumably reactive. Normal caliber abdominal aorta. Reproductive: Prostate is unremarkable. Other: Peripancreatic fat stranding with ill-defined fluid, inflammatory changes track distally in the right retroperitoneum with non organized free fluid in the right pericolic gutter. Small amount of mesenteric free fluid which tracks into the pelvis. There is no free intra-abdominal air. Musculoskeletal: There are no acute or suspicious osseous abnormalities. IMPRESSION: 1. Acute edematous pancreatitis  with moderate amount of peripancreatic fat stranding and acute peripancreatic fluid collection measuring 3.7 x 3.5 x 4.0 cm about the pancreatic head. This causes mild mass effect and deformity of the superior mesenteric vein but no evidence of internal thrombus. 2. Low-density involving the duodenal bulb anteriorly is presumably ill-defined wall thickening and fluid rather than discontinuity. 3. Hepatic steatosis. 4. Aortic valve calcifications are partially included in the lower thorax. Consider further  evaluation with echocardiogram. Electronically Signed   By: Keith Rake M.D.   On: 06/06/2020 19:35   ECHOCARDIOGRAM COMPLETE  Result Date: 06/09/2020    ECHOCARDIOGRAM REPORT   Patient Name:   [name] [name] Date of Exam: 06/09/2020 Medical Rec #:  [id number]      Height:       71.0 in Accession #:    [id number]     Weight:       180.0 lb Date of Birth:  Aug 08, 1990       BSA:          2.016 m Patient Age:    29 years       BP:           120/76 mmHg Patient Gender: M              HR:           57 bpm. Exam Location:  Inpatient Procedure: 2D Echo Indications:    Aortic Valve Disorder  History:        Patient has no prior history of Echocardiogram examinations.                 Aortic Valve Disease and bicuspid aortic valve, AS. Acute                 pancreatitis.  Sonographer:    Dustin Flock Referring Phys: [place]  1. Left ventricular ejection fraction, by estimation, is 60 to 65%. The left ventricle has normal function. The left ventricle has no regional wall motion abnormalities. There is mild left ventricular hypertrophy. Left ventricular diastolic parameters were normal.  2. Right ventricular systolic function is normal. The right ventricular size is normal. There is normal pulmonary artery systolic pressure.  3. The mitral valve is normal in structure. No evidence of mitral valve regurgitation. No evidence of mitral stenosis.  4. The aortic valve is bicuspid. There is mild calcification of the aortic valve. There is moderate thickening of the aortic valve. Aortic valve regurgitation is moderate. Mild aortic valve stenosis. Aortic regurgitation PHT measures 470 msec. Aortic valve area, by VTI measures 1.39 cm. Aortic valve mean gradient measures 16.0 mmHg. Aortic valve Vmax measures 2.87 m/s.  5. The inferior vena cava is normal in size with greater than 50% respiratory variability, suggesting right atrial pressure of 3 mmHg. FINDINGS  Left Ventricle: Left ventricular  ejection fraction, by estimation, is 60 to 65%. The left ventricle has normal function. The left ventricle has no regional wall motion abnormalities. The left ventricular internal cavity size was normal in size. There is  mild left ventricular hypertrophy. Left ventricular diastolic parameters were normal. Right Ventricle: The right ventricular size is normal. No increase in right ventricular wall thickness. Right ventricular systolic function is normal. There is normal pulmonary artery systolic pressure. The tricuspid regurgitant velocity is 2.39 m/s, and  with an assumed right atrial pressure of 3 mmHg, the estimated right ventricular systolic pressure is 74.2 mmHg. Left Atrium: Left atrial size was normal in size. Right  Atrium: Right atrial size was normal in size. Pericardium: There is no evidence of pericardial effusion. Mitral Valve: The mitral valve is normal in structure. No evidence of mitral valve regurgitation. No evidence of mitral valve stenosis. Tricuspid Valve: The tricuspid valve is normal in structure. Tricuspid valve regurgitation is mild . No evidence of tricuspid stenosis. Aortic Valve: The aortic valve is bicuspid. There is mild calcification of the aortic valve. There is moderate thickening of the aortic valve. Aortic valve regurgitation is moderate. Aortic regurgitation PHT measures 470 msec. Mild aortic stenosis is present. Aortic valve mean gradient measures 16.0 mmHg. Aortic valve peak gradient measures 32.9 mmHg. Aortic valve area, by VTI measures 1.39 cm. Pulmonic Valve: The pulmonic valve was normal in structure. Pulmonic valve regurgitation is not visualized. No evidence of pulmonic stenosis. Aorta: The aortic root is normal in size and structure. Venous: The inferior vena cava is normal in size with greater than 50% respiratory variability, suggesting right atrial pressure of 3 mmHg. IAS/Shunts: No atrial level shunt detected by color flow Doppler.  LEFT VENTRICLE PLAX 2D LVIDd:          4.40 cm  Diastology LVIDs:         2.60 cm  LV e' medial:    6.85 cm/s LV PW:         1.40 cm  LV E/e' medial:  7.9 LV IVS:        1.30 cm  LV e' lateral:   8.59 cm/s LVOT diam:     2.40 cm  LV E/e' lateral: 6.3 LV SV:         85 LV SV Index:   42 LVOT Area:     4.52 cm  RIGHT VENTRICLE RV Basal diam:  2.60 cm RV S prime:     7.51 cm/s TAPSE (M-mode): 2.1 cm LEFT ATRIUM             Index       RIGHT ATRIUM           Index LA diam:        2.90 cm 1.44 cm/m  RA Area:     12.60 cm LA Vol (A2C):   33.8 ml 16.76 ml/m RA Volume:   28.20 ml  13.99 ml/m LA Vol (A4C):   41.8 ml 20.73 ml/m LA Biplane Vol: 39.7 ml 19.69 ml/m  AORTIC VALVE AV Area (Vmax):    1.51 cm AV Area (Vmean):   1.34 cm AV Area (VTI):     1.39 cm AV Vmax:           287.00 cm/s AV Vmean:          192.000 cm/s AV VTI:            0.608 m AV Peak Grad:      32.9 mmHg AV Mean Grad:      16.0 mmHg LVOT Vmax:         96.10 cm/s LVOT Vmean:        56.800 cm/s LVOT VTI:          0.187 m LVOT/AV VTI ratio: 0.31 AI PHT:            470 msec  AORTA Ao Root diam: 3.50 cm MITRAL VALVE               TRICUSPID VALVE MV Area (PHT): 2.66 cm    TR Peak grad:   22.8 mmHg MV Decel Time: 285 msec    TR Vmax:  239.00 cm/s MV E velocity: 54.30 cm/s MV A velocity: 45.10 cm/s  SHUNTS MV E/A ratio:  1.20        Systemic VTI:  0.19 m                            Systemic Diam: 2.40 cm Donato Schultz MD Electronically signed by Donato Schultz MD Signature Date/Time: 06/09/2020/1:08:20 PM    Final    US Abdomen Limited RUQ (LIVER/GB)  Result Date: 06/06/2020 CLINICAL DATA:  Acute pancreatitis EXAM: ULTRASOUND ABDOMEN LIMITED RIGHT UPPER QUADRANT COMPARISON:  None. FINDINGS: Gallbladder: No gallstones or wall thickening visualized. No sonographic Murphy sign noted by sonographer. Common bile duct: Diameter: 3.9 mm Liver: Increased echotexture seen throughout. No focal abnormality or biliary ductal dilatation. Portal vein is patent on color Doppler imaging with normal  direction of blood flow towards the liver. Other: None. IMPRESSION: Mild hepatic steatosis. Normal appearing gallbladder. Electronically Signed   By: Jonna Clark M.D.   On: 06/06/2020 22:14    Microbiology: Recent Results (from the past 240 hour(s))  Resp Panel by RT-PCR (Flu A&B, Covid)     Status: None   Collection Time: 06/06/20  8:52 PM  Result Value Ref Range Status   SARS Coronavirus 2 by RT PCR NEGATIVE NEGATIVE Final    Comment: (NOTE) SARS-CoV-2 target nucleic acids are NOT DETECTED.  The SARS-CoV-2 RNA is generally detectable in upper respiratory specimens during the acute phase of infection. The lowest concentration of SARS-CoV-2 viral copies this assay can detect is 138 copies/mL. A negative result does not preclude SARS-Cov-2 infection and should not be used as the sole basis for treatment or other patient management decisions. A negative result may occur with  improper specimen collection/handling, submission of specimen other than nasopharyngeal swab, presence of viral mutation(s) within the areas targeted by this assay, and inadequate number of viral copies(<138 copies/mL). A negative result must be combined with clinical observations, patient history, and epidemiological information. The expected result is Negative.  Fact Sheet for Patients:  BloggerCourse.com  Fact Sheet for Healthcare Providers:  SeriousBroker.it  This test is no t yet approved or cleared by the [place] FDA and  has been authorized for detection and/or diagnosis of SARS-CoV-2 by FDA under an Emergency Use Authorization (EUA). This EUA will remain  in effect (meaning this test can be used) for the duration of the COVID-19 declaration under Section 564(b)(1) of the Act, 21 U.S.C.section 360bbb-3(b)(1), unless the authorization is terminated  or revoked sooner.       Influenza A by PCR NEGATIVE NEGATIVE Final   Influenza B by PCR  NEGATIVE NEGATIVE Final    Comment: (NOTE) The Xpert Xpress SARS-CoV-2/FLU/RSV plus assay is intended as an aid in the diagnosis of influenza from Nasopharyngeal swab specimens and should not be used as a sole basis for treatment. Nasal washings and aspirates are unacceptable for Xpert Xpress SARS-CoV-2/FLU/RSV testing.  Fact Sheet for Patients: BloggerCourse.com  Fact Sheet for Healthcare Providers: SeriousBroker.it  This test is not yet approved or cleared by the [place] FDA and has been authorized for detection and/or diagnosis of SARS-CoV-2 by FDA under an Emergency Use Authorization (EUA). This EUA will remain in effect (meaning this test can be used) for the duration of the COVID-19 declaration under Section 564(b)(1) of the Act, 21 U.S.C. section 360bbb-3(b)(1), unless the authorization is terminated or revoked.  Performed at [facility], [address]  [place], [place] [address]      Labs: Basic Metabolic Panel: Recent Labs  Lab 06/06/20 1357 06/07/20 0307 06/08/20 0302 06/09/20 0325  NA 141 141 138 139  K 3.7 3.1* 3.5 3.7  CL 103 106 104 105  CO2 $Re'26 29 25 26  'FWP$ GLUCOSE 171* 105* 97 104*  BUN $Re'14 11 8 7  'vpp$ CREATININE 0.78 0.60* 0.71 0.73  CALCIUM 9.7 8.9 9.1 8.9  MG  --   --  2.1 2.0   Liver Function Tests: Recent Labs  Lab 06/06/20 1357 06/07/20 0307 06/08/20 0302 06/09/20 0325  AST 50* $Remov'24 20 16  'Eoxgsi$ ALT 58* 36 31 24  ALKPHOS 68 48 47 48  BILITOT 0.6 0.8 0.8 0.6  PROT 8.4* 6.6 6.7 6.9  ALBUMIN 5.1* 3.9 4.1 4.0   Recent Labs  Lab 06/06/20 1357 06/09/20 0325  LIPASE 198* 52*   No results for input(s): AMMONIA in the last 168 hours. CBC: Recent Labs  Lab 06/06/20 1357 06/07/20 0307 06/08/20 0302 06/09/20 0325  WBC 12.5* 8.3 8.0 8.0  NEUTROABS  --   --  4.8 4.7  HGB 14.1 11.9* 12.3* 12.1*  HCT 42.7 36.5* 37.5* 37.3*  MCV 93.4 94.8 95.7 95.6  PLT 326 241 224 233    Cardiac Enzymes: No results for input(s): CKTOTAL, CKMB, CKMBINDEX, TROPONINI in the last 168 hours. BNP: BNP (last 3 results) No results for input(s): BNP in the last 8760 hours.  ProBNP (last 3 results) No results for input(s): PROBNP in the last 8760 hours.  CBG: No results for input(s): GLUCAP in the last 168 hours.     Signed:  Irine Seal MD.  [facility] 06/09/2020, 4:05 PM

## 2020-06-09 NOTE — Progress Notes (Signed)
Pt tolerated soft diet well with no pain to report. Pt stated he did hope to go home today.

## 2020-06-09 NOTE — Progress Notes (Signed)
Pt stable at time of discharge instructions. No needs at time of education.

## 2020-06-09 NOTE — Plan of Care (Signed)
Pt to d/c home.  

## 2020-06-09 NOTE — Progress Notes (Signed)
  Echocardiogram 2D Echocardiogram has been performed.  Pieter Partridge 06/09/2020, 10:59 AM

## 2020-06-09 NOTE — Progress Notes (Signed)
Initial Nutrition Assessment  DOCUMENTATION CODES:   Not applicable  INTERVENTION:  - will order 30 ml Prosource Plus BID, each supplement provides 100 kcal and 15 grams protein.  - will order 1 tablet multivitamin with minerals/day.   NUTRITION DIAGNOSIS:   Increased nutrient needs related to acute illness as evidenced by estimated needs.  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 His last admission for pancreatitis was 11/8. He presented to the ED on 12/27 with abdominal pain and N/V. Lipase at presentation was 198. CT abdomen/pelvis with contrast showed acute edematous pancreatitis with moderate amount of peripancreatic fat stranding and acute peripancreatic fluid collection measuring 3.7 x 3.5 x 4.0 cm about the pancreatic head. Changes of hepatic steatosis also noted.  Diet advanced from NPO to CLD on 12/28 at 1011, to FLD at 1400, and to Soft on 12/29 at 0955. He has been consuming mainly 100% of meals since diet advancement began.   Patient has not been seen by a [place] RD at any time in the past.   Weight on admission date of 12/27 was 180 lb, which appears to be a stated weight. The only other weight in the chart is from 04/18/20 when he weighed 184 lb. This indicates 4 lb weight loss (2% body weight) in 1.5 months; not significant for time frame.   Per notes: - acute pancreatitis--plan for outpatient GI follow-up - transaminitis - from home with plan for home at the time of d/c   Labs reviewed; lipase: 52 u/l. Medications reviewed; 17 g miralax BID, 1 tablet senokot BID.  IVF; NS @ 150 ml/hr.     NUTRITION - FOCUSED PHYSICAL EXAM:  unable to complete at  this time.   Diet Order:   Diet Order            DIET SOFT Room service appropriate? Yes; Fluid consistency: Thin  Diet effective now                 EDUCATION NEEDS:   No education needs have been identified at this time  Skin:  Skin Assessment: Reviewed RN Assessment  Last BM:  12/27  Height:   Ht Readings from Last 1 Encounters:  06/06/20 5\' 11"  (1.803 m)    Weight:   Wt Readings from Last 1 Encounters:  06/06/20 81.6 kg     Estimated Nutritional Needs:  Kcal:  2100-2300 kcal Protein:  110-125 grams Fluid:  >/= 2.5 L/day      06/08/20, MS, RD, LDN, CNSC Inpatient Clinical Dietitian RD pager # available in [facility]  After hours/weekend pager # available in [facility]

## 2020-06-09 NOTE — Plan of Care (Signed)
  Problem: Clinical Measurements: Goal: Respiratory complications will improve Outcome: Progressing   Problem: Clinical Measurements: Goal: Cardiovascular complication will be avoided Outcome: Progressing   Problem: Pain Managment: Goal: General experience of comfort will improve Outcome: Progressing   

## 2021-07-28 ENCOUNTER — Ambulatory Visit: Payer: Commercial Managed Care - PPO | Admitting: Family

## 2021-07-28 ENCOUNTER — Encounter: Payer: Self-pay | Admitting: Family

## 2021-07-28 ENCOUNTER — Other Ambulatory Visit: Payer: Self-pay

## 2021-07-28 VITALS — BP 124/80 | HR 100 | Temp 98.1°F | Ht 71.0 in | Wt 193.4 lb

## 2021-07-28 DIAGNOSIS — I350 Nonrheumatic aortic (valve) stenosis: Secondary | ICD-10-CM | POA: Diagnosis not present

## 2021-07-28 DIAGNOSIS — Z8719 Personal history of other diseases of the digestive system: Secondary | ICD-10-CM | POA: Insufficient documentation

## 2021-07-28 DIAGNOSIS — Z23 Encounter for immunization: Secondary | ICD-10-CM | POA: Diagnosis not present

## 2021-07-28 DIAGNOSIS — K4090 Unilateral inguinal hernia, without obstruction or gangrene, not specified as recurrent: Secondary | ICD-10-CM

## 2021-07-28 NOTE — Assessment & Plan Note (Addendum)
pt reports first noticing a couple of months ago and it has continued to increase in size and causing more and more discomfort, large amount of swelling noted in right scrotum, non-tender to palpation, pt would like to consult with a surgeon. Sending referral.

## 2021-07-28 NOTE — Patient Instructions (Addendum)
Welcome to [facility]! It was a pleasure meeting you today.  As discussed, I am sending the referral to general surgery and cardiology, they will call you directly.  Please schedule a physical with fasting labs at your convenience today.    PLEASE NOTE:  If you had any LAB tests please let us know if you have not heard back within a few days. You may see your results on MyChart before we have a chance to review them but we will give you a call once they are reviewed by [place]. If we ordered any REFERRALS today, please let us know if you have not heard from their office within the next week.  Let us know through MyChart if you are needing REFILLS, or have your pharmacy send [place] the request. You can also use MyChart to communicate with me or any office staff.  Please try these tips to maintain a healthy lifestyle:  Eat most of your calories during the day when you are active. Eliminate processed foods including packaged sweets (pies, cakes, cookies), reduce intake of potatoes, white bread, white pasta, and white rice. Look for whole grain options, oat flour or almond flour.  Each meal should contain half fruits/vegetables, one quarter protein, and one quarter carbs (no bigger than a computer mouse).  Cut down on sweet beverages. This includes juice, soda, and sweet tea. Also watch fruit intake, though this is a healthier sweet option, it still contains natural sugar! Limit to 3 servings daily.  Drink at least 1 glass of water with each meal and aim for at least 8 glasses per day  Exercise at least 150 minutes every week.

## 2021-07-28 NOTE — Progress Notes (Signed)
New Patient Office Visit  Subjective:  Patient ID: [name] [name], male    DOB: [birth year]  Age: 31 y.o. MRN: [id number]  CC:  Chief Complaint  Patient presents with   Establish Care   Referral    Gen Surgery-Hernia He specifies it being there for a couple of months.     HPI [name] [name] presents for establishing care and to discuss one problem.  Inguinal hernia:  has had for 2 months, no pain unless working out, sexual intercourse, or walking for long periods, and it will become painful. Extends into right scrotum, no burning pain, no problems with ejaculation or urination, or penile discharge. He would like a referral to have repaired. Aortic Stenosis:  pt reports last evaluated by Dr. Josiah Lobo in [place] in 2018 and told he should be seen every 2 years, but due to pandemic, job changes, no insurance, he has not been able to follow up. Denies any symptoms today, no SOB, dizziness, or fatigue.  Past Medical History:  Diagnosis Date   Heart murmur    Pancreatitis     History reviewed. No pertinent surgical history.  Family History  Problem Relation Age of Onset   Pancreatitis Maternal Uncle     Social History   Socioeconomic History   Marital status: Single    Spouse name: Not on file   Number of children: Not on file   Years of education: Not on file   Highest education level: Not on file  Occupational History   Not on file  Tobacco Use   Smoking status: Never   Smokeless tobacco: Never  Substance and Sexual Activity   Alcohol use: Yes   Drug use: Not Currently   Sexual activity: Yes  Other Topics Concern   Not on file  Social History Narrative   Not on file   Social Determinants of Health   Financial Resource Strain: Not on file  Food Insecurity: Not on file  Transportation Needs: Not on file  Physical Activity: Not on file  Stress: Not on file  Social Connections: Not on file  Intimate Partner Violence: Not on file     Objective:   Today's Vitals: BP 124/80    Pulse 100    Temp 98.1 F (36.7 C) (Temporal)    Ht 5\' 11"  (1.803 m)    Wt 193 lb 6.4 oz (87.7 kg)    SpO2 97%    BMI 26.97 kg/m   Physical Exam Vitals and nursing note reviewed.  Constitutional:      General: He is not in acute distress.    Appearance: Normal appearance.  HENT:     Head: Normocephalic.  Cardiovascular:     Rate and Rhythm: Normal rate and regular rhythm.     Heart sounds: Murmur (systolic) heard.  Pulmonary:     Effort: Pulmonary effort is normal.     Breath sounds: Normal breath sounds.  Abdominal:     Hernia: A hernia is present. Hernia is present in the right inguinal area (enlarged right scrotum).  Musculoskeletal:        General: Normal range of motion.     Cervical back: Normal range of motion.  Skin:    General: Skin is warm and dry.  Neurological:     Mental Status: He is alert and oriented to person, place, and time.  Psychiatric:        Mood and Affect: Mood normal.    Assessment & Plan:  Problem List Items Addressed This Visit       Cardiovascular and Mediastinum   Mild aortic stenosis    has not followed up since 2018, due to pandemic and other issues, but told he should be seen every 2 years. Denies any sx. But advised he should do this now, sending referral.      Relevant Orders   Ambulatory referral to Cardiology     Other   Unilateral inguinal hernia without obstruction or gangrene    pt reports first noticing a couple of months ago and it has continued to increase in size and causing more and more discomfort, large amount of swelling noted in right scrotum, non-tender to palpation, pt would like to consult with a surgeon. Sending referral.      Relevant Orders   Ambulatory referral to General Surgery   Other Visit Diagnoses     Need for immunization against influenza    -  Primary   Relevant Orders   Flu Vaccine QUAD 83mo+IM (Fluarix, Fluzone & Alfiuria Quad PF) (Completed)        Outpatient Encounter Medications as of 07/28/2021  Medication Sig   albuterol (VENTOLIN HFA) 108 (90 Base) MCG/ACT inhaler Inhale 2 puffs into the lungs every 6 (six) hours as needed for wheezing or shortness of breath.   loratadine (CLARITIN) 10 MG tablet Take 10 mg by mouth daily.   polyethylene glycol (MIRALAX / GLYCOLAX) 17 g packet Take 17 g by mouth daily as needed.   [DISCONTINUED] Multiple Vitamin (MULTIVITAMIN WITH MINERALS) TABS tablet Take 1 tablet by mouth daily. (Patient not taking: Reported on 07/28/2021)   [DISCONTINUED] ondansetron (ZOFRAN ODT) 4 MG disintegrating tablet Take 1 tablet (4 mg total) by mouth every 8 (eight) hours as needed for nausea or vomiting. (Patient not taking: Reported on 07/28/2021)   [DISCONTINUED] senna-docusate (SENOKOT-S) 8.6-50 MG tablet Take 1 tablet by mouth at bedtime as needed for mild constipation. (Patient not taking: Reported on 07/28/2021)   No facility-administered encounter medications on file as of 07/28/2021.    Follow-up: Return for Complete physical w/fasting labs.   [name], NP

## 2021-07-28 NOTE — Assessment & Plan Note (Signed)
has not followed up since 2018, due to pandemic and other issues, but told he should be seen every 2 years. Denies any sx. But advised he should do this now, sending referral.

## 2021-08-02 ENCOUNTER — Encounter: Payer: Self-pay | Admitting: Family

## 2021-08-02 ENCOUNTER — Other Ambulatory Visit: Payer: Self-pay

## 2021-08-02 ENCOUNTER — Ambulatory Visit (INDEPENDENT_AMBULATORY_CARE_PROVIDER_SITE_OTHER): Payer: Commercial Managed Care - PPO | Admitting: Family

## 2021-08-02 VITALS — BP 131/87 | HR 66 | Temp 98.0°F | Ht 71.0 in | Wt 194.2 lb

## 2021-08-02 DIAGNOSIS — Z0000 Encounter for general adult medical examination without abnormal findings: Secondary | ICD-10-CM | POA: Diagnosis not present

## 2021-08-02 DIAGNOSIS — Z136 Encounter for screening for cardiovascular disorders: Secondary | ICD-10-CM

## 2021-08-02 LAB — COMPREHENSIVE METABOLIC PANEL
ALT: 19 U/L (ref 0–53)
AST: 21 U/L (ref 0–37)
Albumin: 4.6 g/dL (ref 3.5–5.2)
Alkaline Phosphatase: 48 U/L (ref 39–117)
BUN: 18 mg/dL (ref 6–23)
CO2: 32 mEq/L (ref 19–32)
Calcium: 9.7 mg/dL (ref 8.4–10.5)
Chloride: 104 mEq/L (ref 96–112)
Creatinine, Ser: 0.82 mg/dL (ref 0.40–1.50)
GFR: 117.85 mL/min (ref >60.00)
Glucose, Bld: 105 mg/dL — ABNORMAL HIGH (ref 70–99)
Potassium: 4.3 mEq/L (ref 3.5–5.1)
Sodium: 141 mEq/L (ref 135–145)
Total Bilirubin: 0.4 mg/dL (ref 0.2–1.2)
Total Protein: 7.2 g/dL (ref 6.0–8.3)

## 2021-08-02 LAB — CBC WITH DIFFERENTIAL/PLATELET
Basophils Absolute: 0 10*3/uL (ref 0.0–0.1)
Basophils Relative: 0.4 % (ref 0.0–3.0)
Eosinophils Absolute: 0.3 10*3/uL (ref 0.0–0.7)
Eosinophils Relative: 7.2 % — ABNORMAL HIGH (ref 0.0–5.0)
HCT: 39.2 % (ref 39.0–52.0)
Hemoglobin: 13.4 g/dL (ref 13.0–17.0)
Lymphocytes Relative: 35.3 % (ref 12.0–46.0)
Lymphs Abs: 1.4 10*3/uL (ref 0.7–4.0)
MCHC: 34.1 g/dL (ref 30.0–36.0)
MCV: 93.1 fl (ref 78.0–100.0)
Monocytes Absolute: 0.5 10*3/uL (ref 0.1–1.0)
Monocytes Relative: 12.5 % — ABNORMAL HIGH (ref 3.0–12.0)
Neutro Abs: 1.8 10*3/uL (ref 1.4–7.7)
Neutrophils Relative %: 44.6 % (ref 43.0–77.0)
Platelets: 191 10*3/uL (ref 150.0–400.0)
RBC: 4.21 Mil/uL — ABNORMAL LOW (ref 4.22–5.81)
RDW: 13.5 % (ref 11.5–15.5)
WBC: 4 10*3/uL (ref 4.0–10.5)

## 2021-08-02 LAB — LIPID PANEL
Cholesterol: 183 mg/dL (ref 0–200)
HDL: 66 mg/dL (ref >39.00)
LDL Cholesterol: 96 mg/dL (ref 0–99)
NonHDL: 117.38
Total CHOL/HDL Ratio: 3
Triglycerides: 106 mg/dL (ref 0.0–149.0)
VLDL: 21.2 mg/dL (ref 0.0–40.0)

## 2021-08-02 LAB — TSH: TSH: 2.57 u[IU]/mL (ref 0.35–5.50)

## 2021-08-02 NOTE — Patient Instructions (Signed)

## 2021-08-02 NOTE — Progress Notes (Signed)
Phone: [phone]   Subjective:  Patient 31 y.o. male presenting for annual physical.  Chief Complaint  Patient presents with   Annual Exam    Pt says that he has no concerns today. He was under the impression that he was going to have an U/S today. Pt was told that he would be contacted for scheduling it. He states that he ignored the phone call.     hypokalemia    See problem oriented charting- ROS- full  review of systems was completed and negative    The following were reviewed and entered/updated in epic: Past Medical History:  Diagnosis Date   Heart murmur    Pancreatitis    Patient Active Problem List   Diagnosis Date Noted   Unilateral inguinal hernia without obstruction or gangrene 07/28/2021   Transaminitis 06/08/2020   Hypokalemia 06/08/2020   Aortic valve calcification 06/08/2020   Acute pancreatitis 06/06/2020   Mild aortic stenosis 11/01/2016   Bicuspid aortic valve 11/01/2016   History reviewed. No pertinent surgical history.  Family History  Problem Relation Age of Onset   Pancreatitis Maternal Uncle     Medications- reviewed and updated Current Outpatient Medications  Medication Sig Dispense Refill   albuterol (VENTOLIN HFA) 108 (90 Base) MCG/ACT inhaler Inhale 2 puffs into the lungs every 6 (six) hours as needed for wheezing or shortness of breath.     loratadine (CLARITIN) 10 MG tablet Take 10 mg by mouth daily.     polyethylene glycol (MIRALAX / GLYCOLAX) 17 g packet Take 17 g by mouth daily as needed. 14 each 0   No current facility-administered medications for this visit.    Allergies-reviewed and updated Allergies  Allergen Reactions   Amoxicillin Hives    Social History   Social History Narrative   Not on file   Objective  Objective:  BP 131/87    Pulse 66    Temp 98 F (36.7 C) (Temporal)    Ht 5\' 11"  (1.803 m)    Wt 194 lb 3.2 oz (88.1 kg)    SpO2 98%    BMI 27.09 kg/m  Gen: NAD, resting comfortably HEENT: Mucous  membranes are moist. Oropharynx normal Neck: no thyromegaly CV: RRR no murmurs rubs or gallops Lungs: CTAB no crackles, wheeze, rhonchi Abdomen: soft/nontender/nondistended/normal bowel sounds. No rebound or guarding.  Ext: no edema Skin: warm, dry Neuro: grossly normal, moves all extremities, PERRLA    Assessment and Plan   Health Maintenance counseling: 1. Anticipatory guidance: Patient counseled regarding regular dental exams q6 months, eye exams yearly, avoiding smoking and second hand smoke, limiting alcohol to 2 beverages per day.   2. Risk factor reduction:  Advised patient of need for regular exercise and diet rich in fruits and vegetables to reduce risk of heart attack and stroke.    Wt Readings from Last 3 Encounters:  08/02/21 194 lb 3.2 oz (88.1 kg)  07/28/21 193 lb 6.4 oz (87.7 kg)  06/06/20 180 lb (81.6 kg)   3. Immunizations/screenings/ancillary studies Immunization History  Administered Date(s) Administered   Influenza,inj,Quad PF,6+ Mos 07/28/2021   Influenza-Unspecified 03/12/2019   Health Maintenance Due  Topic Date Due   Hepatitis C Screening  Never done   TETANUS/TDAP  Never done    4. Skin cancer screening- advised regular sunscreen use. Denies worrisome, changing, or new skin lesions.  5. Smoking associated screening: non- smoker 6. STD screening - N/A   Problem List Items Addressed This Visit   None Visit Diagnoses  Annual physical exam    -  Primary   Relevant Orders   Comprehensive metabolic panel   TSH   Lipid panel   CBC with Differential/Platelet       Recommended follow up: Return for any future concerns. No future appointments.   Lab/Order associations: fasting   ICD-10-CM   1. Annual physical exam  Z00.00 Comprehensive metabolic panel    TSH    Lipid panel    CBC with Differential/Platelet      [name], NP

## 2021-08-07 NOTE — Progress Notes (Signed)
Electrolytes, kidney, liver, & thyroid fx all in good range. Cholesterol numbers look great. Keep up the good work with diet & exercise. Just his blood sugar slightly high, but he was not fasting. His Eosinophils on his blood count slightly high, but this is usually related to having some type of allergy symptoms. Thx

## 2021-08-18 NOTE — Progress Notes (Signed)
?Cardiology Office Note:   ? ?Date:  08/21/2021  ? ?ID:  [name] [name], DOB 03-20-1991, MRN [id number] ? ?PCP:  [name] Sellar, NP  ? ?[facility] Providers ?Cardiologist:  [name] Skeans, MD ?Referring MD: [name] Sellar, NP  ? ?Chief Complaint/Reason for Referral: Mild aortic stenosis ? ?ASSESSMENT:   ? ?Aortic stenosis due to bicuspid aortic valve ? ?Elevated blood pressure reading ? ?PLAN:   ? ?In order of problems listed above: ?1.  We will obtain CTA to evaluate ascending aorta for aneurysms and obtain echocardiogram now to evaluate his aortic valve.  If his ascending aorta measures 3.5 to 4.4 cm we will plan on annual CT but if it is 4.5 or higher referral to cardiothoracic surgery given the generally lower threshold for intervention with an associated bicuspid aortic valve.  He will need first-degree relatives to be screened for bicuspid aortic valves as well.  He is able to perform 4 METS of activity and so if his echocardiogram or CT scan do not show high risk findings I think he is at relatively low risk for perioperative cardiac complication for planned hernia surgery.  Follow up in 6 months. ?2.  If his CT scan is concerning we might need to start a beta-blocker to better control his blood pressure. ? ?  ? ?Dispo:  Return in about 6 months (around 02/21/2022).  ?  ? ?Medication Adjustments/Labs and Tests Ordered: ?Current medicines are reviewed at length with the patient today.  Concerns regarding medicines are outlined above.  ? ?Tests Ordered: ?No orders of the defined types were placed in this encounter. ? ? ?Medication Changes: ?No orders of the defined types were placed in this encounter. ? ? ?History of Present Illness:   ? ?FOCUSED PROBLEM LIST:   ?1.  Bicuspid aortic valve with mild aortic stenosis with a mean gradient of 60 mmHg, V-max of 2.87 m/s and a aortic valve area of 1.39 cm? on echocardiogram 2021 ?2.  Family history of bicuspid aortic valve ?3.  Multiple  episodes of acute pancreatitis believed secondary to alcohol use ?4.  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 ? ?The patient is a 31 y.o. male with the indicated medical history here for recommendations regarding bicuspid aortic valve stenosis which is mild in severity.  The patient had an echocardiogram done due to an incidentally detected murmur.  This demonstrated bicuspid aortic valve stenosis.  He has known about his bicuspid valve for some time and his younger brother actually has a bicuspid valve as well.  He has a another younger brother who has been screened and does not have a bicuspid valve. ? ?He currently works as a Leisure centre manager.  He denies any shortness of breath, chest pain, presyncope, or syncope.  He is able to do all of his activities of daily living without any issues.  He does not smoke and has curtailed his drinking.  He has required no hospitalizations or emergency room visits recently.  He denies any signs or symptoms of stroke, Paraschos nocturnal dyspnea, orthopnea, or peripheral edema. ? ?He does have issues with chronic pain as he has a symptomatic hernia that requires surgical repair. ? ?   ? ? ? ?Current Medications: ?Current Meds  ?Medication Sig  ? albuterol (VENTOLIN HFA) 108 (90 Base) MCG/ACT inhaler Inhale 2 puffs into the lungs every 6 (six) hours as needed for wheezing or shortness of breath.  ? loratadine (CLARITIN) 10 MG tablet Take 10 mg by mouth daily.  ?  ? ?  Allergies:    ?Amoxicillin  ? ?Social History:   ?Social History  ? ?Tobacco Use  ? Smoking status: Never  ? Smokeless tobacco: Never  ?Substance Use Topics  ? Alcohol use: Yes  ? Drug use: Not Currently  ?  ? ?Family Hx: ?Family History  ?Problem Relation Age of Onset  ? Pancreatitis Maternal Uncle   ?  ? ?Review of Systems:   ?Please see the history of present illness.    ?All other systems reviewed and are negative. ?  ? ? ?EKGs/Labs/Other Test Reviewed:   ? ?EKG: Sinus rhythm left anterior fascicular block ? ?Prior CV studies: ?TTE  2021 ? 1. Left ventricular ejection fraction, by estimation, is 60 to 65%. The  ?left ventricle has normal function. The left ventricle has no regional  ?wall motion abnormalities. There is mild left ventricular hypertrophy.  ?Left ventricular diastolic parameters  ?were normal.  ? 2. Right ventricular systolic function is normal. The right ventricular  ?size is normal. There is normal pulmonary artery systolic pressure.  ? 3. The mitral valve is normal in structure. No evidence of mitral valve  ?regurgitation. No evidence of mitral stenosis.  ? 4. The aortic valve is bicuspid. There is mild calcification of the  ?aortic valve. There is moderate thickening of the aortic valve. Aortic  ?valve regurgitation is moderate. Mild aortic valve stenosis. Aortic  ?regurgitation PHT measures 470 msec. Aortic  ?valve area, by VTI measures 1.39 cm?Marland Kitchen Aortic valve mean gradient measures  ?16.0 mmHg. Aortic valve Vmax measures 2.87 m/s.  ? 5. The inferior vena cava is normal in size with greater than 50%  ?respiratory variability, suggesting right atrial pressure of 3 mmHg.  ? ?Imaging studies that I have independently reviewed today: CT ? ?Recent Labs: ?08/02/2021: ALT 19; BUN 18; Creatinine, Ser 0.82; Hemoglobin 13.4; Platelets 191.0; Potassium 4.3; Sodium 141; TSH 2.57  ? ?Recent Lipid Panel ?Lab Results  ?Component Value Date/Time  ? CHOL 183 08/02/2021 08:30 AM  ? TRIG 106.0 08/02/2021 08:30 AM  ? HDL 66.00 08/02/2021 08:30 AM  ? LDLCALC 96 08/02/2021 08:30 AM  ? ? ?Risk Assessment/Calculations:   ? ? ?    ? ?Physical Exam:   ? ?VS:  BP (!) 154/98   Pulse 81   Ht 5\' 11"  (1.803 m)   Wt 193 lb (87.5 kg)   BMI 26.92 kg/m?    ?Wt Readings from Last 3 Encounters:  ?08/21/21 193 lb (87.5 kg)  ?08/02/21 194 lb 3.2 oz (88.1 kg)  ?07/28/21 193 lb 6.4 oz (87.7 kg)  ?  ?GENERAL:  No apparent distress, AOx3 ?HEENT: Radiation of cardiac murmur to carotids, +2 carotid impulses, no scleral icterus ?CAR: RRR  3/6 SEM with diastolic murmur  no gallops, rubs, or thrills ?RES:  Clear to auscultation bilaterally ?ABD:  Soft, nontender, nondistended, positive bowel sounds x 4 ?VASC:  +2 radial pulses, +2 carotid pulses, palpable pedal pulses ?NEURO:  CN 2-12 grossly intact; motor and sensory grossly intact ?PSYCH:  No active depression or anxiety ?EXT:  No edema, ecchymosis, or cyanosis ? ?Signed, ?07/30/21, MD  ?08/21/2021 2:23 PM    ?[facility] ?[address] [place], [place], [place]  [place] ?Phone: [phone]; Fax: [phone]  ? ?Note:  This document was prepared using Dragon voice recognition software and may include unintentional dictation errors. ?

## 2021-08-21 ENCOUNTER — Other Ambulatory Visit: Payer: Self-pay

## 2021-08-21 ENCOUNTER — Encounter: Payer: Self-pay | Admitting: Internal Medicine

## 2021-08-21 ENCOUNTER — Ambulatory Visit: Payer: Commercial Managed Care - PPO | Admitting: Internal Medicine

## 2021-08-21 VITALS — BP 154/98 | HR 81 | Ht 71.0 in | Wt 193.0 lb

## 2021-08-21 DIAGNOSIS — Q231 Congenital insufficiency of aortic valve: Secondary | ICD-10-CM

## 2021-08-21 DIAGNOSIS — Q230 Congenital stenosis of aortic valve: Secondary | ICD-10-CM

## 2021-08-21 DIAGNOSIS — R030 Elevated blood-pressure reading, without diagnosis of hypertension: Secondary | ICD-10-CM | POA: Diagnosis not present

## 2021-08-21 NOTE — Patient Instructions (Signed)
Medication Instructions:  ?Your physician recommends that you continue on your current medications as directed. Please refer to the Current Medication list given to you today. ? ?*If you need a refill on your cardiac medications before your next appointment, please call your pharmacy* ? ? ?Lab Work: ?NONE ?If you have labs (blood work) drawn today and your tests are completely normal, you will receive your results only by: ?MyChart Message (if you have MyChart) OR ?A paper copy in the mail ?If you have any lab test that is abnormal or we need to change your treatment, we will call you to review the results. ? ? ?Testing/Procedures: ?CT Chest ?Non-Cardiac CT scanning, (CAT scanning), is a noninvasive, special x-ray that produces cross-sectional images of the body using x-rays and a computer. CT scans help physicians diagnose and treat medical conditions. For some CT exams, a contrast material is used to enhance visibility in the area of the body being studied. CT scans provide greater clarity and reveal more details than regular x-ray exams.  ? ?ECHO ?Your physician has requested that you have an echocardiogram. Echocardiography is a painless test that uses sound waves to create images of your heart. It provides your doctor with information about the size and shape of your heart and how well your heart?s chambers and valves are working. This procedure takes approximately one hour. There are no restrictions for this procedure. ? ?Follow-Up: ?At [facility], you and your health needs are our priority.  As part of our continuing mission to provide you with exceptional heart care, we have created designated Provider Care Teams.  These Care Teams include your primary Cardiologist (physician) and Advanced Practice Providers (APPs -  Physician Assistants and Nurse Practitioners) who all work together to provide you with the care you need, when you need it. ? ?We recommend signing up for the patient portal called  "MyChart".  Sign up information is provided on this After Visit Summary.  MyChart is used to connect with patients for Virtual Visits (Telemedicine).  Patients are able to view lab/test results, encounter notes, upcoming appointments, etc.  Non-urgent messages can be sent to your provider as well.   ?To learn more about what you can do with MyChart, go to [email or web address].   ? ?Your next appointment:   ?6 month(s) ? ?The format for your next appointment:   ?In Person ? ?Provider:   ?Lenna Sciara MD ? ? ?  ?

## 2021-08-23 ENCOUNTER — Ambulatory Visit: Payer: Self-pay | Admitting: General Surgery

## 2021-08-23 NOTE — Progress Notes (Signed)
Sent message, via epic in basket, requesting orders in epic from surgeon.  

## 2021-08-24 ENCOUNTER — Encounter (HOSPITAL_COMMUNITY)
Admission: RE | Admit: 2021-08-24 | Discharge: 2021-08-24 | Disposition: A | Discharge status: Home / Self Care | Payer: Commercial Managed Care - PPO | Source: Ambulatory Visit | Attending: General Surgery | Admitting: General Surgery

## 2021-08-24 ENCOUNTER — Encounter (HOSPITAL_COMMUNITY): Payer: Self-pay

## 2021-08-24 ENCOUNTER — Other Ambulatory Visit: Payer: Self-pay

## 2021-08-24 DIAGNOSIS — K4030 Unilateral inguinal hernia, with obstruction, without gangrene, not specified as recurrent: Secondary | ICD-10-CM | POA: Insufficient documentation

## 2021-08-24 DIAGNOSIS — I350 Nonrheumatic aortic (valve) stenosis: Secondary | ICD-10-CM | POA: Insufficient documentation

## 2021-08-24 DIAGNOSIS — J45909 Unspecified asthma, uncomplicated: Secondary | ICD-10-CM | POA: Diagnosis not present

## 2021-08-24 DIAGNOSIS — I714 Abdominal aortic aneurysm, without rupture, unspecified: Secondary | ICD-10-CM | POA: Diagnosis not present

## 2021-08-24 DIAGNOSIS — Z01812 Encounter for preprocedural laboratory examination: Secondary | ICD-10-CM | POA: Insufficient documentation

## 2021-08-24 HISTORY — DX: Pneumonia, unspecified organism: J18.9

## 2021-08-24 HISTORY — DX: Nonrheumatic aortic (valve) stenosis: I35.0

## 2021-08-24 HISTORY — DX: Unspecified asthma, uncomplicated: J45.909

## 2021-08-24 HISTORY — DX: Headache, unspecified: R51.9

## 2021-08-24 NOTE — Patient Instructions (Signed)
DUE TO COVID-19 ONLY ONE VISITOR  (aged 31 and older)  IS ALLOWED TO COME WITH YOU AND STAY IN THE WAITING ROOM ONLY DURING PRE OP AND PROCEDURE.   ?**NO VISITORS ARE ALLOWED IN THE SHORT STAY AREA OR RECOVERY ROOM!!** ?  ? ? Your procedure is scheduled on: 08-29-21 ? ? Report to [facility] Main Entrance ? ?  Report to admitting at     [place]  AM ? ? Call this number if you have problems the morning of surgery [phone] ? ? Do not eat food :After Midnight. ? ? After Midnight you may have the following liquids until __0630____ AM DAY OF SURGERY  THEN NOTHING BY MOUTH ? ?Water ?Black Coffee (sugar ok, NO MILK/CREAM OR CREAMERS)  ?Tea (sugar ok, NO MILK/CREAM OR CREAMERS) regular and decaf                             ?Plain Jell-O (NO RED)                                           ?Fruit ices (not with fruit pulp, NO RED)                                     ?Popsicles (NO RED)                                                                  ?Juice: apple, WHITE grape, WHITE cranberry ?Sports drinks like Gatorade (NO RED) ?Clear broth(vegetable,chicken,beef) ? ?             ?  ?  ?The day of surgery:  ?Drink ONE (1) Pre-Surgery Clear Ensure at  0630 AM the morning of surgery. Drink in one sitting. Do not sip.  ?This drink was given to you during your hospital  ?pre-op appointment visit. ?Nothing else to drink after completing the  ?Pre-Surgery Clear Ensure  ?  ?       If you have questions, please contact your surgeon?s office. ? ? ?FOLLOW BOWEL PREP AND ANY ADDITIONAL PRE OP INSTRUCTIONS YOU RECEIVED FROM YOUR SURGEON'S OFFICE!!! ?  ?  ?Oral Hygiene is also important to reduce your risk of infection.                                    ?Remember - BRUSH YOUR TEETH THE MORNING OF SURGERY WITH YOUR REGULAR TOOTHPASTE ? ? Do NOT smoke after Midnight ? ? Take these medicines the morning of surgery with A SIP OF WATER: loratadine, inhaler and bring it with you ? ? ?                  ?           You may not have any  metal on your body including hair pins, jewelry, and body piercing ? ?           Do not wear  lotions, powders, /cologne, or deodorant ? ? ?            Men may shave face and neck. ? ? Do not bring valuables to the hospital. [facility] NOT ?            RESPONSIBLE   FOR VALUABLES. ? ? Contacts, dentures or bridgework may not be worn into surgery. ? ?  ?  ? Patients discharged on the day of surgery will not be allowed to drive home.  Someone NEEDS to stay with you for the first 24 hours after anesthesia. ? ? ?            Please read over the following fact sheets you were given: IF [place] [id number] ? ?   [facility] - Preparing for Surgery ?Before surgery, you can play an important role.  Because skin is not sterile, your skin needs to be as free of germs as possible.  You can reduce the number of germs on your skin by washing with CHG (chlorahexidine gluconate) soap before surgery.  CHG is an antiseptic cleaner which kills germs and bonds with the skin to continue killing germs even after washing. ?Please DO NOT use if you have an allergy to CHG or antibacterial soaps.  If your skin becomes reddened/irritated stop using the CHG and inform your nurse when you arrive at Short Stay. ?Do not shave (including legs and underarms) for at least 48 hours prior to the first CHG shower.  You may shave your face/neck. ?Please follow these instructions carefully: ? 1.  Shower with CHG Soap the night before surgery and the  morning of Surgery. ? 2.  If you choose to wash your hair, wash your hair first as usual with your  normal  shampoo. ? 3.  After you shampoo, rinse your hair and body thoroughly to remove the  shampoo.                           4.  Use CHG as you would any other liquid soap.  You can apply chg directly  to the skin and wash  ?                     Gently with a scrungie or clean washcloth. ? 5.  Apply the CHG Soap to your body ONLY FROM THE NECK  DOWN.   Do not use on face/ open      ?                     Wound or open sores. Avoid contact with eyes, ears mouth and genitals (private parts).  ?                     Production manager,  Genitals (private parts) with your normal soap. ?            6.  Wash thoroughly, paying special attention to the area where your surgery  will be performed. ? 7.  Thoroughly rinse your body with warm water from the neck down. ? 8.  DO NOT shower/wash with your normal soap after using and rinsing off  the CHG Soap. ?               9.  Pat yourself dry with a clean towel. ?  10.  Wear clean pajamas. ?           11.  Place clean sheets on your bed the night of your first shower and do not  sleep with pets. ?Day of Surgery : ?Do not apply any lotions/deodorants the morning of surgery.  Please wear clean clothes to the hospital/surgery center. ? ?FAILURE TO FOLLOW THESE INSTRUCTIONS MAY RESULT IN THE CANCELLATION OF YOUR SURGERY ?PATIENT SIGNATURE_________________________________ ? ?NURSE SIGNATURE__________________________________ ? ?________________________________________________________________________  ?

## 2021-08-24 NOTE — Progress Notes (Addendum)
PCP - Stephaine Hudnnell ?Cardiologist - [name] clearance 08-21-21 epic ? ?PPM/ICD -  ?Device Orders -  ?Rep Notified -  ? ?Chest x-ray -  ?EKG - 08-21-21 epic ?Stress Test -  ?ECHO - 2021 epic ?Cardiac Cath -  ?CBC/diff, CMP 08-02-21 epic ? ?Sleep Study -  ?CPAP -  ? ?Fasting Blood Sugar -  ?Checks Blood Sugar _____ times a day ? ?Blood Thinner Instructions: ?Aspirin Instructions: ? ?ERAS Protcol - ?PRE-SURGERY Ensure  ? ?COVID TEST- N/A ?COVID vaccine -yes x3  Pfizer ? ?Activity--Able to walk a flight of stairs without SOB or CP ?Anesthesia review: mild aortic stenosis ? ?Patient denies shortness of breath, fever, cough and chest pain at PAT appointment ? ? ?All instructions explained to the patient, with a verbal understanding of the material. Patient agrees to go over the instructions while at home for a better understanding. Patient also instructed to self quarantine after being tested for COVID-19. The opportunity to ask questions was provided. ?  ?

## 2021-08-25 ENCOUNTER — Encounter (HOSPITAL_COMMUNITY): Payer: Self-pay | Admitting: Physician Assistant

## 2021-08-25 NOTE — Progress Notes (Addendum)
Anesthesia Chart Review ? ? Case: [id number] Date/Time: 08/29/21 0915  ? Procedure: OPEN HERNIA REPAIR RIGHT INGUINAL INCARCERATED WITH MESH (Right) - 90 MIN - ROOM 4  ? Anesthesia type: General  ? Pre-op diagnosis: INCARCERATED RIGHT INGUINAL HERNIA, NONRECURRENT  ? Location: [facility] ROOM 04 / [facility]  ? Surgeons: Gaynelle Adu, MD  ? ?  ? ? ?DISCUSSION:30 y.o. never smoker with h/o asthma, mild aortic stenosis, incarcerated right inguinal hernia  ? ?Pt last seen by cardiology 08/21/2021. Per OV note, "We will obtain CTA to evaluate ascending aorta for aneurysms and obtain echocardiogram now to evaluate his aortic valve.  If his ascending aorta measures 3.5 to 4.4 cm we will plan on annual CT but if it is 4.5 or higher referral to cardiothoracic surgery given the generally lower threshold for intervention with an associated bicuspid aortic valve.  He will need first-degree relatives to be screened for bicuspid aortic valves as well.  He is able to perform 4 METS of activity and so if his echocardiogram or CT scan do not show high risk findings I think he is at relatively low risk for perioperative cardiac complication for planned hernia surgery.  Follow up in 6 months." ? ?Echo and CT currently scheduled for after surgery date. Discussed with Dr. Lynnette Caffey to see if tests can be rescheduled for before surgery or if surgery should be postponed.  Pt Dr. Lynnette Caffey CT is not imperative.  Echo has been rescheduled to 08/28/2021.  I will follow for results.  ? ?Addendum 08/28/2021:  ?Pt no showed for 8am Echo on 08/28/21.  Spoke with cardiology office who state they can get him in this afternoon.  Discussed with surgeon's office.  Per cardiology surgery should be rescheduled if unable to get Echo today.  ? ?Echo completed, AAA measuring 47mm, AS severe with AVA 1.5cm 2, mean gradient 23 mmHg.  Surgery will be postponed, he will follow up with cardiology later this week, cardiothoracic surgical eval pending.  Discussed with triage nurse  at Dr. Tawana Scale office.  ?VS: BP 129/82   Pulse 70   Temp 36.6 ?C (Oral)   Resp 18   Ht 5\' 11"  (1.803 m)   Wt 89.4 kg   SpO2 99%   BMI 27.48 kg/m?  ? ?PROVIDERS: ? , NP ? ?Cardiologist:  [name], MD ? ?LABS: Labs reviewed: Acceptable for surgery. ?(all labs ordered are listed, but only abnormal results are displayed) ? ?Labs Reviewed - No data to display ? ? ?IMAGES: ? ? ?EKG: ? ? ?CV: ?Echo 06/09/2020 ?1. Left ventricular ejection fraction, by estimation, is 60 to 65%. The  ?left ventricle has normal function. The left ventricle has no regional  ?wall motion abnormalities. There is mild left ventricular hypertrophy.  ?Left ventricular diastolic parameters  ?were normal.  ? 2. Right ventricular systolic function is normal. The right ventricular  ?size is normal. There is normal pulmonary artery systolic pressure.  ? 3. The mitral valve is normal in structure. No evidence of mitral valve  ?regurgitation. No evidence of mitral stenosis.  ? 4. The aortic valve is bicuspid. There is mild calcification of the  ?aortic valve. There is moderate thickening of the aortic valve. Aortic  ?valve regurgitation is moderate. Mild aortic valve stenosis. Aortic  ?regurgitation PHT measures 470 msec. Aortic  ?valve area, by VTI measures 1.39 cm?06/11/2020 Aortic valve mean gradient measures  ?16.0 mmHg. Aortic valve Vmax measures 2.87 m/s.  ? 5. The inferior vena cava is normal in size  with greater than 50%  ?respiratory variability, suggesting right atrial pressure of 3 mmHg.  ?Past Medical History:  ?Diagnosis Date  ? Asthma   ? Headache   ? migraines  ? Heart murmur   ? Mild aortic stenosis   ? Pancreatitis   ? Pneumonia   ? as a child x2  ? ? ?Past Surgical History:  ?Procedure Laterality Date  ? NO PAST SURGERIES    ? ? ?MEDICATIONS: ? albuterol (VENTOLIN HFA) 108 (90 Base) MCG/ACT inhaler  ? loratadine (CLARITIN) 10 MG tablet  ? ?No current facility-administered medications for this encounter.   ? ? ?Jodell Cipro Ward, [name] ?[facility] ?[phone] ? ? ? ? ? ?

## 2021-08-28 ENCOUNTER — Telehealth: Payer: Self-pay | Admitting: Internal Medicine

## 2021-08-28 ENCOUNTER — Other Ambulatory Visit: Payer: Self-pay

## 2021-08-28 ENCOUNTER — Ambulatory Visit (HOSPITAL_COMMUNITY)
Admission: RE | Admit: 2021-08-28 | Discharge: 2021-08-28 | Disposition: A | Discharge status: Home / Self Care | Payer: Commercial Managed Care - PPO | Source: Ambulatory Visit | Attending: Internal Medicine | Admitting: Internal Medicine

## 2021-08-28 ENCOUNTER — Ambulatory Visit (HOSPITAL_COMMUNITY): Payer: Commercial Managed Care - PPO | Attending: Internal Medicine

## 2021-08-28 DIAGNOSIS — Q231 Congenital insufficiency of aortic valve: Secondary | ICD-10-CM | POA: Insufficient documentation

## 2021-08-28 DIAGNOSIS — Q230 Congenital stenosis of aortic valve: Secondary | ICD-10-CM | POA: Diagnosis not present

## 2021-08-28 DIAGNOSIS — R030 Elevated blood-pressure reading, without diagnosis of hypertension: Secondary | ICD-10-CM | POA: Diagnosis present

## 2021-08-28 DIAGNOSIS — I7121 Aneurysm of the ascending aorta, without rupture: Secondary | ICD-10-CM | POA: Diagnosis not present

## 2021-08-28 LAB — ECHOCARDIOGRAM COMPLETE
AR max vel: 1.36 cm2
AV Area VTI: 1.37 cm2
AV Area mean vel: 1.29 cm2
AV Mean grad: 23 mmHg
AV Peak grad: 40.4 mmHg
Ao pk vel: 3.18 m/s
Area-P 1/2: 3.23 cm2
Calc EF: 56.5 %
P 1/2 time: 275 msec
S' Lateral: 3.4 cm
Single Plane A2C EF: 59 %
Single Plane A4C EF: 58.4 %

## 2021-08-28 NOTE — Telephone Encounter (Signed)
Notified by Dr. Bjorn Pippin about severe bicuspid aortic valvular disease (AS+AI) and ascending aortic aneurysm (~5.2cm).  I spoke with patient and communicated to care team that elective hernia surgery will need to be postponed.  He will need cardiothoracic surgical evaluation and will arrange for him to see me this week.  He has a CT scan scheduled for this Thursday.  Patient agrees with plan. ?

## 2021-08-29 ENCOUNTER — Ambulatory Visit (HOSPITAL_COMMUNITY)
Admission: RE | Admit: 2021-08-29 | Payer: Commercial Managed Care - PPO | Source: Home / Self Care | Admitting: General Surgery

## 2021-08-29 ENCOUNTER — Encounter (HOSPITAL_COMMUNITY): Admission: RE | Payer: Self-pay | Source: Home / Self Care

## 2021-08-29 SURGERY — REPAIR, HERNIA, INGUINAL, INCARCERATED
Anesthesia: General | Laterality: Right

## 2021-08-29 NOTE — H&P (View-Only) (Signed)
?Cardiology Office Note:   ? ?Date:  08/30/2021  ? ?ID:  [name] [name], DOB 1990-09-09, MRN [id number] ? ?PCP:  [name], NP  ? ?[facility] Providers ?Cardiologist:  [name], MD ?Referring MD: [name], NP  ? ?Chief Complaint/Reason for Referral: Follow-up ? ?ASSESSMENT:   ? ?Bicuspid aortic valve - Plan: CBC, Basic metabolic panel ? ?Aneurysm of ascending aorta without rupture - Plan: CBC, Basic metabolic panel ? ?Hernia of abdominal wall - Plan: CBC, Basic metabolic panel ? ?Pre-procedure lab exam - Plan: CBC, Basic metabolic panel ? ?PLAN:   ? ?In order of problems listed above: ?1 and 2.  The combination of his bicuspid aortic valve disease and ascending aortic aneurysm is an indication for surgical repair.  I we will start Lopressor 25 mg twice daily.  I will refer him for cardiothoracic surgical evaluation.  His CT scan to completely evaluate his ascending aortic aneurysm has been scheduled for tomorrow.  Follow-up in 3 months or earlier if needed.  I will schedule him for preoperative coronary angiography study as well.  He is tentatively scheduled to meet with cardiothoracic surgery on April 3.  I have asked him to refrain from extreme exertion but moderate exercise such as walking is fine.  I talked to the patient about the need for coronary angiography prior to the procedure I described the risks of this procedure.  He agrees to proceed with preprocedural coronary angiography and right heart catheterization. ?3.  Unfortunately his hernia surgery will need to be postponed.  He will likely need analgesics.  Hopefully an indication for urgent or emergent hernia repair does not develop in the near future.  I asked him to reach out to his surgeon or PCP for any analgesics he might need. ? ?  ? ?Dispo:  Return in about 3 months (around 11/30/2021).  ?  ? ?Medication Adjustments/Labs and Tests Ordered: ?Current medicines are reviewed at length with the patient today.   Concerns regarding medicines are outlined above.  ? ?Tests Ordered: ?Orders Placed This Encounter  ?Procedures  ? CBC  ? Basic metabolic panel  ? ? ?Medication Changes: ?Meds ordered this encounter  ?Medications  ? metoprolol tartrate (LOPRESSOR) 25 MG tablet  ?  Sig: Take 1 tablet (25 mg total) by mouth 2 (two) times daily.  ?  Dispense:  180 tablet  ?  Refill:  3  ? ? ?History of Present Illness:   ? ?FOCUSED PROBLEM LIST:   ?1.  Severe bicuspid aortic valve disease consisting of moderate to severe aortic regurgitation and moderate aortic valve stenosis with ascending aortic aneurysm of 52 mm on echocardiogram March 2023 ?2.  Family history of bicuspid aortic valve ?3.  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 ?The patient returns for expedited follow-up.  I referred the patient for an echocardiogram prior to elective hernia repair.  This demonstrated high risk findings with severe bicuspid aortic valve disease consisting of moderate to severe aortic stenosis and aortic insufficiency with ascending aortic aneurysm of 52 mmHg.  His elective hernia surgery was therefore postponed and he is here for follow-up.  He is scheduled for a CT scan tomorrow. ? ?The patient is understandably quite anxious.  He denies any shortness of breath, chest pains, palpitations, paroxysmal nocturnal dyspnea, or orthopnea.  He has questions regarding his FMLA leave and whether 4 months would be enough time to have his cardiac and general surgery  procedures. ? ? ?Current Medications: ?Current Meds  ?Medication Sig  ? albuterol (VENTOLIN HFA) 108 (90 Base) MCG/ACT inhaler Inhale 2 puffs into the lungs every 6 (six) hours as needed for wheezing or shortness of breath.  ? loratadine (CLARITIN) 10 MG tablet Take 10 mg by mouth daily.  ? metoprolol tartrate (LOPRESSOR) 25 MG tablet Take 1 tablet (25 mg total) by mouth 2 (two) times daily.  ?  ? ?Allergies:    ?Amoxicillin   ? ?Social History:   ?Social History  ? ?Tobacco Use  ? Smoking status: Never  ? Smokeless tobacco: Never  ?Vaping Use  ? Vaping Use: Never used  ?Substance Use Topics  ? Alcohol use: Not Currently  ?  Comment: 10 drinks a week  ? Drug use: Not Currently  ?  Comment: Edibles occasionally  marijuana  ?  ? ?Family Hx: ?Family History  ?Problem Relation Age of Onset  ? Pancreatitis Maternal Uncle   ?  ? ?Review of Systems:   ?Please see the history of present illness.    ?All other systems reviewed and are negative. ?  ? ? ?EKGs/Labs/Other Test Reviewed:   ? ?EKG: Sinus rhythm left anterior fascicular block ? ?Prior CV studies: ?TTE 2023 ?1. Left ventricular ejection fraction, by estimation, is 60 to 65%. The  ?left ventricle has normal function. The left ventricle has no regional  ?wall motion abnormalities. There is mild left ventricular hypertrophy.  ?Left ventricular diastolic parameters  ?were normal.  ? 2. Right ventricular systolic function is normal. The right ventricular  ?size is normal. There is normal pulmonary artery systolic pressure. The  ?estimated right ventricular systolic pressure is 20.5 mmHg.  ? 3. Left atrial size was mildly dilated.  ? 4. The mitral valve is normal in structure. Trivial mitral valve  ?regurgitation.  ? 5. The inferior vena cava is normal in size with greater than 50%  ?respiratory variability, suggesting right atrial pressure of 3 mmHg.  ? 6. The aortic valve was not well visualized. Aortic valve regurgitation  ?is moderate to severe. Moderate aortic valve stenosis. Vmax 3.2 m/s, MG 23  ?mmHg, AVA 1.5 cm^2, DI 0.27. Eccentric AI jet appears moderate visually,  ?but likely underestimating due to  ? eccentric jet. There does appear to be holodiastolic flow reversal in  ?aorta, argues for severe AI  ? 7. Aortic dilatation noted. 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  ? ?Imaging studies that I have independently reviewed today: CT ? ?Recent Labs: ?08/02/2021: ALT 19;  BUN 18; Creatinine, Ser 0.82; Hemoglobin 13.4; Platelets 191.0; Potassium 4.3; Sodium 141; TSH 2.57  ? ?Recent Lipid Panel ?Lab Results  ?Component Value Date/Time  ? CHOL 183 08/02/2021 08:30 AM  ? TRIG 106.0 08/02/2021 08:30 AM  ? HDL 66.00 08/02/2021 08:30 AM  ? LDLCALC 96 08/02/2021 08:30 AM  ? ? ?Risk Assessment/Calculations:   ? ? ?    ? ?Physical Exam:   ? ?VS:  BP (!) 137/96   Pulse 97   Ht 5\' 11"  (1.803 m)   Wt 195 lb (88.5 kg)   SpO2 100%   BMI 27.20 kg/m?    ?Wt Readings from Last 3 Encounters:  ?08/30/21 195 lb (88.5 kg)  ?08/24/21 197 lb (89.4 kg)  ?08/21/21 193 lb (87.5 kg)  ?  ?GENERAL:  No apparent distress, AOx3 ?HEENT: Radiation of cardiac murmur to carotids, +2 carotid impulses, no scleral icterus ?CAR: RRR  3/6 SEM with diastolic murmur no gallops, rubs,  or thrills ?RES:  Clear to auscultation bilaterally ?ABD:  Soft, nontender, nondistended, positive bowel sounds x 4 ?VASC:  +2 radial pulses, +2 carotid pulses, palpable pedal pulses ?NEURO:  CN 2-12 grossly intact; motor and sensory grossly intact ?PSYCH:  No active depression or anxiety ?EXT:  No edema, ecchymosis, or cyanosis ? ?Signed, ?Orbie Pyo, MD  ?08/30/2021 2:01 PM    ?[facility] ?[address] [place], [place], [place]  [address] ?Phone: [phone]; Fax: [phone]  ? ?Note:  This document was prepared using Dragon voice recognition software and may include unintentional dictation errors. ?

## 2021-08-29 NOTE — Progress Notes (Signed)
?Cardiology Office Note:   ? ?Date:  08/30/2021  ? ?ID:  [name] [name], DOB 1990-09-09, MRN [id number] ? ?PCP:  [name], NP  ? ?[facility] Providers ?Cardiologist:  [name], MD ?Referring MD: [name], NP  ? ?Chief Complaint/Reason for Referral: Follow-up ? ?ASSESSMENT:   ? ?Bicuspid aortic valve - Plan: CBC, Basic metabolic panel ? ?Aneurysm of ascending aorta without rupture - Plan: CBC, Basic metabolic panel ? ?Hernia of abdominal wall - Plan: CBC, Basic metabolic panel ? ?Pre-procedure lab exam - Plan: CBC, Basic metabolic panel ? ?PLAN:   ? ?In order of problems listed above: ?1 and 2.  The combination of his bicuspid aortic valve disease and ascending aortic aneurysm is an indication for surgical repair.  I we will start Lopressor 25 mg twice daily.  I will refer him for cardiothoracic surgical evaluation.  His CT scan to completely evaluate his ascending aortic aneurysm has been scheduled for tomorrow.  Follow-up in 3 months or earlier if needed.  I will schedule him for preoperative coronary angiography study as well.  He is tentatively scheduled to meet with cardiothoracic surgery on April 3.  I have asked him to refrain from extreme exertion but moderate exercise such as walking is fine.  I talked to the patient about the need for coronary angiography prior to the procedure I described the risks of this procedure.  He agrees to proceed with preprocedural coronary angiography and right heart catheterization. ?3.  Unfortunately his hernia surgery will need to be postponed.  He will likely need analgesics.  Hopefully an indication for urgent or emergent hernia repair does not develop in the near future.  I asked him to reach out to his surgeon or PCP for any analgesics he might need. ? ?  ? ?Dispo:  Return in about 3 months (around 11/30/2021).  ?  ? ?Medication Adjustments/Labs and Tests Ordered: ?Current medicines are reviewed at length with the patient today.   Concerns regarding medicines are outlined above.  ? ?Tests Ordered: ?Orders Placed This Encounter  ?Procedures  ? CBC  ? Basic metabolic panel  ? ? ?Medication Changes: ?Meds ordered this encounter  ?Medications  ? metoprolol tartrate (LOPRESSOR) 25 MG tablet  ?  Sig: Take 1 tablet (25 mg total) by mouth 2 (two) times daily.  ?  Dispense:  180 tablet  ?  Refill:  3  ? ? ?History of Present Illness:   ? ?FOCUSED PROBLEM LIST:   ?1.  Severe bicuspid aortic valve disease consisting of moderate to severe aortic regurgitation and moderate aortic valve stenosis with ascending aortic aneurysm of 52 mm on echocardiogram March 2023 ?2.  Family history of bicuspid aortic valve ?3.  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 ?The patient returns for expedited follow-up.  I referred the patient for an echocardiogram prior to elective hernia repair.  This demonstrated high risk findings with severe bicuspid aortic valve disease consisting of moderate to severe aortic stenosis and aortic insufficiency with ascending aortic aneurysm of 52 mmHg.  His elective hernia surgery was therefore postponed and he is here for follow-up.  He is scheduled for a CT scan tomorrow. ? ?The patient is understandably quite anxious.  He denies any shortness of breath, chest pains, palpitations, paroxysmal nocturnal dyspnea, or orthopnea.  He has questions regarding his FMLA leave and whether 4 months would be enough time to have his cardiac and general surgery  procedures. ? ? ?Current Medications: ?Current Meds  ?Medication Sig  ? albuterol (VENTOLIN HFA) 108 (90 Base) MCG/ACT inhaler Inhale 2 puffs into the lungs every 6 (six) hours as needed for wheezing or shortness of breath.  ? loratadine (CLARITIN) 10 MG tablet Take 10 mg by mouth daily.  ? metoprolol tartrate (LOPRESSOR) 25 MG tablet Take 1 tablet (25 mg total) by mouth 2 (two) times daily.  ?  ? ?Allergies:    ?Amoxicillin   ? ?Social History:   ?Social History  ? ?Tobacco Use  ? Smoking status: Never  ? Smokeless tobacco: Never  ?Vaping Use  ? Vaping Use: Never used  ?Substance Use Topics  ? Alcohol use: Not Currently  ?  Comment: 10 drinks a week  ? Drug use: Not Currently  ?  Comment: Edibles occasionally  marijuana  ?  ? ?Family Hx: ?Family History  ?Problem Relation Age of Onset  ? Pancreatitis Maternal Uncle   ?  ? ?Review of Systems:   ?Please see the history of present illness.    ?All other systems reviewed and are negative. ?  ? ? ?EKGs/Labs/Other Test Reviewed:   ? ?EKG: Sinus rhythm left anterior fascicular block ? ?Prior CV studies: ?TTE 2023 ?1. Left ventricular ejection fraction, by estimation, is 60 to 65%. The  ?left ventricle has normal function. The left ventricle has no regional  ?wall motion abnormalities. There is mild left ventricular hypertrophy.  ?Left ventricular diastolic parameters  ?were normal.  ? 2. Right ventricular systolic function is normal. The right ventricular  ?size is normal. There is normal pulmonary artery systolic pressure. The  ?estimated right ventricular systolic pressure is 20.5 mmHg.  ? 3. Left atrial size was mildly dilated.  ? 4. The mitral valve is normal in structure. Trivial mitral valve  ?regurgitation.  ? 5. The inferior vena cava is normal in size with greater than 50%  ?respiratory variability, suggesting right atrial pressure of 3 mmHg.  ? 6. The aortic valve was not well visualized. Aortic valve regurgitation  ?is moderate to severe. Moderate aortic valve stenosis. Vmax 3.2 m/s, MG 23  ?mmHg, AVA 1.5 cm^2, DI 0.27. Eccentric AI jet appears moderate visually,  ?but likely underestimating due to  ? eccentric jet. There does appear to be holodiastolic flow reversal in  ?aorta, argues for severe AI  ? 7. Aortic dilatation noted. 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  ? ?Imaging studies that I have independently reviewed today: CT ? ?Recent Labs: ?08/02/2021: ALT 19;  BUN 18; Creatinine, Ser 0.82; Hemoglobin 13.4; Platelets 191.0; Potassium 4.3; Sodium 141; TSH 2.57  ? ?Recent Lipid Panel ?Lab Results  ?Component Value Date/Time  ? CHOL 183 08/02/2021 08:30 AM  ? TRIG 106.0 08/02/2021 08:30 AM  ? HDL 66.00 08/02/2021 08:30 AM  ? LDLCALC 96 08/02/2021 08:30 AM  ? ? ?Risk Assessment/Calculations:   ? ? ?    ? ?Physical Exam:   ? ?VS:  BP (!) 137/96   Pulse 97   Ht 5' 11" (1.803 m)   Wt 195 lb (88.5 kg)   SpO2 100%   BMI 27.20 kg/m?    ?Wt Readings from Last 3 Encounters:  ?08/30/21 195 lb (88.5 kg)  ?08/24/21 197 lb (89.4 kg)  ?08/21/21 193 lb (87.5 kg)  ?  ?GENERAL:  No apparent distress, AOx3 ?HEENT: Radiation of cardiac murmur to carotids, +2 carotid impulses, no scleral icterus ?CAR: RRR  3/6 SEM with diastolic murmur no gallops, rubs,   or thrills ?RES:  Clear to auscultation bilaterally ?ABD:  Soft, nontender, nondistended, positive bowel sounds x 4 ?VASC:  +2 radial pulses, +2 carotid pulses, palpable pedal pulses ?NEURO:  CN 2-12 grossly intact; motor and sensory grossly intact ?PSYCH:  No active depression or anxiety ?EXT:  No edema, ecchymosis, or cyanosis ? ?Signed, ?Orbie Pyo, MD  ?08/30/2021 2:01 PM    ?[facility] ?[address] [place], [place], [place]  [address] ?Phone: [phone]; Fax: [phone]  ? ?Note:  This document was prepared using Dragon voice recognition software and may include unintentional dictation errors. ?

## 2021-08-30 ENCOUNTER — Other Ambulatory Visit: Payer: Self-pay | Admitting: Internal Medicine

## 2021-08-30 ENCOUNTER — Ambulatory Visit (INDEPENDENT_AMBULATORY_CARE_PROVIDER_SITE_OTHER): Payer: Commercial Managed Care - PPO | Admitting: Internal Medicine

## 2021-08-30 ENCOUNTER — Encounter: Payer: Self-pay | Admitting: Internal Medicine

## 2021-08-30 ENCOUNTER — Other Ambulatory Visit: Payer: Self-pay

## 2021-08-30 VITALS — BP 137/96 | HR 97 | Ht 71.0 in | Wt 195.0 lb

## 2021-08-30 DIAGNOSIS — Q231 Congenital insufficiency of aortic valve: Secondary | ICD-10-CM

## 2021-08-30 DIAGNOSIS — Z01812 Encounter for preprocedural laboratory examination: Secondary | ICD-10-CM | POA: Diagnosis not present

## 2021-08-30 DIAGNOSIS — K439 Ventral hernia without obstruction or gangrene: Secondary | ICD-10-CM

## 2021-08-30 DIAGNOSIS — I7121 Aneurysm of the ascending aorta, without rupture: Secondary | ICD-10-CM

## 2021-08-30 DIAGNOSIS — Q230 Congenital stenosis of aortic valve: Secondary | ICD-10-CM

## 2021-08-30 MED ORDER — SODIUM CHLORIDE 0.9% FLUSH: 3.0000 mL | Freq: Two times a day (BID) | INTRAVENOUS | Status: DC

## 2021-08-30 MED ORDER — METOPROLOL TARTRATE 25 MG PO TABS: 25.0000 mg | ORAL_TABLET | Freq: Two times a day (BID) | ORAL | 3 refills | Status: DC

## 2021-08-30 NOTE — Patient Instructions (Signed)
Medication Instructions:  ?Your physician has recommended you make the following change in your medication:  ?1.) start metoprolol 25 mg - take one tablet by mouth twice a day ? ?*If you need a refill on your cardiac medications before your next appointment, please call your pharmacy* ? ? ?Lab Work: ?Today: CBC, BMET ? ? ?Testing/Procedures: ?Your physician has requested that you have a cardiac catheterization. Cardiac catheterization is used to diagnose and/or treat various heart conditions. Doctors may recommend this procedure for a number of different reasons. The most common reason is to evaluate chest pain. Chest pain can be a symptom of coronary artery disease (CAD), and cardiac catheterization can show whether plaque is narrowing or blocking your heart?s arteries. This procedure is also used to evaluate the valves, as well as measure the blood flow and oxygen levels in different parts of your heart. For further information please visit [email or web address] Please follow instruction sheet, as given. ? ? ? ?Follow-Up: ?3 months- see below. ? ?Other Instructions ? ?[facility] ?[facility] [address] [facility] ?[address], [address] ?[place] [address] ?Dept: [phone] ?Loc: [phone] ? ?[facility] Rexford Cristiano  08/30/2021 ? ?You are scheduled for a Cardiac Catheterization on Thursday, March 30 with Dr. [name]. ? ?1. Please arrive at the Main Entrance A at [facility]: [address] [place], [place] [address] at 5:30 AM (This time is two hours before your procedure to ensure your preparation). Free valet parking service is available.  ? ?Special note: Every effort is made to have your procedure done on time. Please understand that emergencies sometimes delay scheduled procedures. ? ?2. Diet: Do not eat solid foods after midnight.  You may have clear liquids until 5 AM upon the day of the procedure. ? ?3. Labs: You will need to  have blood drawn today.  You do not need to be fasting. ? ?4. Medication instructions in preparation for your procedure: ? ? Contrast Allergy: No ? ? ?On the morning of your procedure, take Aspirin 81 mg and any morning medicines NOT listed above.  You may use sips of water. ? ?5. Plan to go home the same day, you will only stay overnight if medically necessary. ?6. You MUST have a responsible adult to drive you home. ?7. An adult MUST be with you the first 24 hours after you arrive home. ?8. Bring a current list of your medications, and the last time and date medication taken. ?9. Bring ID and current insurance cards. ?10.Please wear clothes that are easy to get on and off and wear slip-on shoes. ? ?Thank you for allowing [place] to care for you! ?  -- [facility] ?  ?

## 2021-08-31 ENCOUNTER — Inpatient Hospital Stay: Admission: RE | Admit: 2021-08-31 | Payer: Commercial Managed Care - PPO | Source: Ambulatory Visit

## 2021-08-31 ENCOUNTER — Ambulatory Visit (INDEPENDENT_AMBULATORY_CARE_PROVIDER_SITE_OTHER)
Admission: RE | Admit: 2021-08-31 | Discharge: 2021-08-31 | Disposition: A | Discharge status: Home / Self Care | Payer: Commercial Managed Care - PPO | Source: Ambulatory Visit | Attending: Internal Medicine | Admitting: Internal Medicine

## 2021-08-31 ENCOUNTER — Other Ambulatory Visit (HOSPITAL_COMMUNITY): Payer: Commercial Managed Care - PPO

## 2021-08-31 DIAGNOSIS — Q230 Congenital stenosis of aortic valve: Secondary | ICD-10-CM

## 2021-08-31 DIAGNOSIS — Q231 Congenital insufficiency of aortic valve: Secondary | ICD-10-CM | POA: Diagnosis not present

## 2021-08-31 LAB — BASIC METABOLIC PANEL
BUN/Creatinine Ratio: 12 (ref 9–20)
BUN: 13 mg/dL (ref 6–20)
CO2: 24 mmol/L (ref 20–29)
Calcium: 9.8 mg/dL (ref 8.7–10.2)
Chloride: 102 mmol/L (ref 96–106)
Creatinine, Ser: 1.06 mg/dL (ref 0.76–1.27)
Glucose: 95 mg/dL (ref 70–99)
Potassium: 4.4 mmol/L (ref 3.5–5.2)
Sodium: 142 mmol/L (ref 134–144)
eGFR: 97 mL/min/{1.73_m2} (ref >59)

## 2021-08-31 LAB — CBC
Hematocrit: 41.3 % (ref 37.5–51.0)
Hemoglobin: 14.6 g/dL (ref 13.0–17.7)
MCH: 31.7 pg (ref 26.6–33.0)
MCHC: 35.4 g/dL (ref 31.5–35.7)
MCV: 90 fL (ref 79–97)
Platelets: 277 10*3/uL (ref 150–450)
RBC: 4.6 x10E6/uL (ref 4.14–5.80)
RDW: 12.8 % (ref 11.6–15.4)
WBC: 5.5 10*3/uL (ref 3.4–10.8)

## 2021-08-31 MED ORDER — IOHEXOL 350 MG/ML SOLN
100.0000 mL | Freq: Once | INTRAVENOUS | Status: AC | PRN
Start: 2021-08-31 — End: 2021-08-31
  Administered 2021-08-31: 100 mL via INTRAVENOUS

## 2021-09-06 ENCOUNTER — Telehealth: Payer: Self-pay | Admitting: *Deleted

## 2021-09-06 NOTE — Telephone Encounter (Signed)
Return call placed to patient to review instructions, no answer, voicemail, left detailed voicemail message for pt with procedure instructions. ?

## 2021-09-06 NOTE — Telephone Encounter (Signed)
Reviewed procedure instructions with patient.  

## 2021-09-06 NOTE — Telephone Encounter (Signed)
Cardiac Catheterization scheduled at [facility] for: Thursday September 07, 2021 7:30 AM ?Arrival time and place: [facility] Main Entrance A at: 5:30 AM ? ? ?No solid food after midnight prior to cath, clear liquids until 5 AM day of procedure. ? ?Medication instructions: ?-Usual morning medications can be taken with sips of water including aspirin 81 mg. ? ?Confirmed patient has responsible adult to drive home post procedure and be with patient first 24 hours after arriving home. ? ?Patient reports no new symptoms concerning for COVID-19/no exposure to COVID-19 in the past 10 days. ? ?Left message for patient to call back to review procedure instructions. ?

## 2021-09-06 NOTE — Telephone Encounter (Signed)
Pt is returning call.  

## 2021-09-06 NOTE — Telephone Encounter (Signed)
Patient returned call

## 2021-09-07 ENCOUNTER — Encounter (HOSPITAL_COMMUNITY)
Admission: RE | Disposition: A | Discharge status: Home / Self Care | Payer: Commercial Managed Care - PPO | Source: Home / Self Care | Attending: Internal Medicine

## 2021-09-07 ENCOUNTER — Other Ambulatory Visit: Payer: Self-pay

## 2021-09-07 ENCOUNTER — Ambulatory Visit (HOSPITAL_COMMUNITY)
Admission: RE | Admit: 2021-09-07 | Discharge: 2021-09-07 | Disposition: A | Discharge status: Home / Self Care | Payer: Commercial Managed Care - PPO | Attending: Internal Medicine | Admitting: Internal Medicine

## 2021-09-07 ENCOUNTER — Encounter (HOSPITAL_COMMUNITY): Payer: Self-pay | Admitting: Internal Medicine

## 2021-09-07 DIAGNOSIS — G8929 Other chronic pain: Secondary | ICD-10-CM | POA: Insufficient documentation

## 2021-09-07 DIAGNOSIS — Z79899 Other long term (current) drug therapy: Secondary | ICD-10-CM | POA: Diagnosis not present

## 2021-09-07 DIAGNOSIS — I7121 Aneurysm of the ascending aorta, without rupture: Secondary | ICD-10-CM | POA: Diagnosis not present

## 2021-09-07 DIAGNOSIS — K439 Ventral hernia without obstruction or gangrene: Secondary | ICD-10-CM | POA: Diagnosis not present

## 2021-09-07 DIAGNOSIS — I352 Nonrheumatic aortic (valve) stenosis with insufficiency: Secondary | ICD-10-CM | POA: Diagnosis not present

## 2021-09-07 DIAGNOSIS — Q231 Congenital insufficiency of aortic valve: Secondary | ICD-10-CM | POA: Diagnosis not present

## 2021-09-07 DIAGNOSIS — Q230 Congenital stenosis of aortic valve: Secondary | ICD-10-CM

## 2021-09-07 HISTORY — PX: RIGHT HEART CATH AND CORONARY ANGIOGRAPHY: CATH118264

## 2021-09-07 LAB — POCT I-STAT EG7
Acid-Base Excess: 1 mmol/L (ref 0.0–2.0)
Acid-Base Excess: 1 mmol/L (ref 0.0–2.0)
Bicarbonate: 26.7 mmol/L (ref 20.0–28.0)
Bicarbonate: 27.1 mmol/L (ref 20.0–28.0)
Calcium, Ion: 1.12 mmol/L — ABNORMAL LOW (ref 1.15–1.40)
Calcium, Ion: 1.16 mmol/L (ref 1.15–1.40)
HCT: 38 % — ABNORMAL LOW (ref 39.0–52.0)
HCT: 39 % (ref 39.0–52.0)
Hemoglobin: 12.9 g/dL — ABNORMAL LOW (ref 13.0–17.0)
Hemoglobin: 13.3 g/dL (ref 13.0–17.0)
O2 Saturation: 83 %
O2 Saturation: 78 %
Potassium: 3.9 mmol/L (ref 3.5–5.1)
Potassium: 3.8 mmol/L (ref 3.5–5.1)
Sodium: 143 mmol/L (ref 135–145)
Sodium: 143 mmol/L (ref 135–145)
TCO2: 28 mmol/L (ref 22–32)
TCO2: 28 mmol/L (ref 22–32)
pCO2, Ven: 45.7 mmHg (ref 44–60)
pCO2, Ven: 46.5 mmHg (ref 44–60)
pH, Ven: 7.375 (ref 7.25–7.43)
pH, Ven: 7.373 (ref 7.25–7.43)
pO2, Ven: 49 mmHg — ABNORMAL HIGH (ref 32–45)
pO2, Ven: 44 mmHg (ref 32–45)

## 2021-09-07 LAB — POCT I-STAT 7, (LYTES, BLD GAS, ICA,H+H)
Acid-Base Excess: 1 mmol/L (ref 0.0–2.0)
Bicarbonate: 26.2 mmol/L (ref 20.0–28.0)
Calcium, Ion: 1.11 mmol/L — ABNORMAL LOW (ref 1.15–1.40)
HCT: 38 % — ABNORMAL LOW (ref 39.0–52.0)
Hemoglobin: 12.9 g/dL — ABNORMAL LOW (ref 13.0–17.0)
O2 Saturation: 98 %
Potassium: 3.9 mmol/L (ref 3.5–5.1)
Sodium: 141 mmol/L (ref 135–145)
TCO2: 28 mmol/L (ref 22–32)
pCO2 arterial: 43.1 mmHg (ref 32–48)
pH, Arterial: 7.393 (ref 7.35–7.45)
pO2, Arterial: 99 mmHg (ref 83–108)

## 2021-09-07 SURGERY — RIGHT HEART CATH AND CORONARY ANGIOGRAPHY
Anesthesia: LOCAL

## 2021-09-07 MED ORDER — SODIUM CHLORIDE 0.9 % IV SOLN: INTRAVENOUS | Status: DC

## 2021-09-07 MED ORDER — ONDANSETRON HCL 4 MG/2ML IJ SOLN: 4.0000 mg | Freq: Four times a day (QID) | INTRAMUSCULAR | Status: DC | PRN

## 2021-09-07 MED ORDER — LIDOCAINE HCL (PF) 1 % IJ SOLN
INTRAMUSCULAR | Status: DC | PRN
  Administered 2021-09-07 (×2): 2 mL

## 2021-09-07 MED ORDER — HEPARIN SODIUM (PORCINE) 1000 UNIT/ML IJ SOLN
INTRAMUSCULAR | Status: DC | PRN
  Administered 2021-09-07: 5000 [IU] via INTRAVENOUS

## 2021-09-07 MED ORDER — MIDAZOLAM HCL 2 MG/2ML IJ SOLN
INTRAMUSCULAR | Status: DC | PRN
  Administered 2021-09-07: 1 mg via INTRAVENOUS

## 2021-09-07 MED ORDER — ACETAMINOPHEN 325 MG PO TABS: 650.0000 mg | ORAL_TABLET | ORAL | Status: DC | PRN

## 2021-09-07 MED ORDER — HEPARIN SODIUM (PORCINE) 1000 UNIT/ML IJ SOLN
INTRAMUSCULAR | Status: AC
  Filled 2021-09-07: qty 10

## 2021-09-07 MED ORDER — HEPARIN (PORCINE) IN NACL 1000-0.9 UT/500ML-% IV SOLN
INTRAVENOUS | Status: DC | PRN
  Administered 2021-09-07 (×2): 500 mL

## 2021-09-07 MED ORDER — HEPARIN (PORCINE) IN NACL 1000-0.9 UT/500ML-% IV SOLN
INTRAVENOUS | Status: AC
  Filled 2021-09-07: qty 1000

## 2021-09-07 MED ORDER — FENTANYL CITRATE (PF) 100 MCG/2ML IJ SOLN
INTRAMUSCULAR | Status: DC | PRN
  Administered 2021-09-07: 25 ug via INTRAVENOUS

## 2021-09-07 MED ORDER — SODIUM CHLORIDE 0.9% FLUSH: 3.0000 mL | Freq: Two times a day (BID) | INTRAVENOUS | Status: DC

## 2021-09-07 MED ORDER — HEPARIN (PORCINE) IN NACL 1000-0.9 UT/500ML-% IV SOLN
INTRAVENOUS | Status: AC
  Filled 2021-09-07: qty 500

## 2021-09-07 MED ORDER — IOHEXOL 350 MG/ML SOLN
INTRAVENOUS | Status: DC | PRN
  Administered 2021-09-07: 50 mL

## 2021-09-07 MED ORDER — HEPARIN (PORCINE) IN NACL 2-0.9 UNITS/ML
INTRAMUSCULAR | Status: DC | PRN
  Administered 2021-09-07: 10 mL via INTRA_ARTERIAL

## 2021-09-07 MED ORDER — SODIUM CHLORIDE 0.9% FLUSH: 3.0000 mL | INTRAVENOUS | Status: DC | PRN

## 2021-09-07 MED ORDER — ASPIRIN 81 MG PO CHEW: 81.0000 mg | CHEWABLE_TABLET | ORAL | Status: DC

## 2021-09-07 MED ORDER — LIDOCAINE HCL (PF) 1 % IJ SOLN
INTRAMUSCULAR | Status: AC
  Filled 2021-09-07: qty 30

## 2021-09-07 MED ORDER — LABETALOL HCL 5 MG/ML IV SOLN: 10.0000 mg | INTRAVENOUS | Status: DC | PRN

## 2021-09-07 MED ORDER — VERAPAMIL HCL 2.5 MG/ML IV SOLN
INTRAVENOUS | Status: AC
  Filled 2021-09-07: qty 2

## 2021-09-07 MED ORDER — SODIUM CHLORIDE 0.9 % IV SOLN: 250.0000 mL | INTRAVENOUS | Status: DC | PRN

## 2021-09-07 MED ORDER — FENTANYL CITRATE (PF) 100 MCG/2ML IJ SOLN
INTRAMUSCULAR | Status: AC
  Filled 2021-09-07: qty 2

## 2021-09-07 MED ORDER — MIDAZOLAM HCL 2 MG/2ML IJ SOLN
INTRAMUSCULAR | Status: AC
  Filled 2021-09-07: qty 2

## 2021-09-07 MED ORDER — HYDRALAZINE HCL 20 MG/ML IJ SOLN: 10.0000 mg | INTRAMUSCULAR | Status: DC | PRN

## 2021-09-07 SURGICAL SUPPLY — 14 items
BAND ZEPHYR COMPRESS 30 LONG (HEMOSTASIS) ×1 IMPLANT
CATH 5FR JR4 DIAGNOSTIC (CATHETERS) ×1 IMPLANT
CATH BALLN WEDGE 5F 110CM (CATHETERS) ×1 IMPLANT
CATH DIAG 6FR JR4 (CATHETERS) ×1 IMPLANT
CATH INFINITI 6F FL3.5 (CATHETERS) ×1 IMPLANT
GLIDESHEATH SLEND SS 6F .021 (SHEATH) ×1 IMPLANT
GUIDEWIRE INQWIRE 1.5J.035X260 (WIRE) IMPLANT
INQWIRE 1.5J .035X260CM (WIRE) ×2
KIT HEART LEFT (KITS) ×2 IMPLANT
PACK CARDIAC CATHETERIZATION (CUSTOM PROCEDURE TRAY) ×2 IMPLANT
SHEATH GLIDE SLENDER 4/5FR (SHEATH) ×1 IMPLANT
SYR MEDRAD MARK 7 150ML (SYRINGE) ×2 IMPLANT
TRANSDUCER W/STOPCOCK (MISCELLANEOUS) ×2 IMPLANT
TUBING CIL FLEX 10 FLL-RA (TUBING) ×2 IMPLANT

## 2021-09-07 NOTE — Interval H&P Note (Signed)
History and Physical Interval Note: ? ?09/07/2021 ?6:43 AM ? ?[name] [name]  has presented today for surgery, with the diagnosis of aortic stenosis.  The various methods of treatment have been discussed with the patient and family. After consideration of risks, benefits and other options for treatment, the patient has consented to  Procedure(s): ?RIGHT/LEFT HEART CATH AND CORONARY ANGIOGRAPHY (N/A) as a surgical intervention.  The patient's history has been reviewed, patient examined, no change in status, stable for surgery.  I have reviewed the patient's chart and labs.  Questions were answered to the patient's satisfaction.   ? ? ?Orbie Pyo ? ? ?

## 2021-09-11 ENCOUNTER — Other Ambulatory Visit: Payer: Self-pay | Admitting: *Deleted

## 2021-09-11 ENCOUNTER — Institutional Professional Consult (permissible substitution) (INDEPENDENT_AMBULATORY_CARE_PROVIDER_SITE_OTHER): Payer: Commercial Managed Care - PPO | Admitting: Thoracic Surgery (Cardiothoracic Vascular Surgery)

## 2021-09-11 ENCOUNTER — Encounter: Payer: Self-pay | Admitting: Thoracic Surgery (Cardiothoracic Vascular Surgery)

## 2021-09-11 ENCOUNTER — Encounter: Payer: Self-pay | Admitting: *Deleted

## 2021-09-11 VITALS — BP 145/90 | HR 66 | Resp 20 | Ht 71.0 in | Wt 198.0 lb

## 2021-09-11 DIAGNOSIS — I350 Nonrheumatic aortic (valve) stenosis: Secondary | ICD-10-CM

## 2021-09-11 DIAGNOSIS — I712 Thoracic aortic aneurysm, without rupture, unspecified: Secondary | ICD-10-CM | POA: Diagnosis not present

## 2021-09-11 NOTE — H&P (View-Only) (Signed)
PCP is [name], NP ?Referring Provider is Orbie Pyo, MD ? ?Chief Complaint  ?Patient presents with  ? Consult  ?  Initial surgical consult, Severe Aortic Regurgitation, TAA CTA 3/23, ECHO 3/20, cath 3/20  ? ? ?HPI: [name] is sent for consultation regarding abnormal bicuspid aortic valve and ascending aneurysm. ? ?[name] [name] is a 31 year old man with a longstanding heart murmur and a known bicuspid aortic valve.  Past medical history is also significant for pancreatitis, asthma, pneumonia and migraines.  He recently was being evaluated for a right inguinal hernia repair.  He was sent for medical clearance due to his heart murmur.  He was referred to Dr. Lynnette [name].  An echocardiogram showed a bicuspid aortic valve with moderate AS and severe AI.  There also was a dilated ascending aorta.  It was estimated at 5.2 cm.  A CT angio of the chest showed the aneurysm actually measures about 4.5 to 4.6 cm.  He had cardiac catheterization which revealed normal coronary arteries.  Right heart cath showed normal filling pressures and preserved cardiac index. ? ?He is not having any chest pain.  He does get short of breath with heavy exertion but nothing out of the ordinary.  He does complain of fatigue. ? ? ?Past Medical History:  ?Diagnosis Date  ? Asthma   ? Headache   ? migraines  ? Heart murmur   ? Mild aortic stenosis   ? Pancreatitis   ? Pneumonia   ? as a child x2  ? ? ?Past Surgical History:  ?Procedure Laterality Date  ? NO PAST SURGERIES    ? RIGHT HEART CATH AND CORONARY ANGIOGRAPHY N/A 09/07/2021  ? Procedure: RIGHT HEART CATH AND CORONARY ANGIOGRAPHY;  Surgeon: Orbie Pyo, MD;  Location: [facility];  Service: Cardiovascular;  Laterality: N/A;  ? ? ?Family History  ?Problem Relation Age of Onset  ? Pancreatitis Maternal Uncle   ? ? ?Social History ?Social History  ? ?Tobacco Use  ? Smoking status: Never  ? Smokeless tobacco: Never  ?Vaping Use  ? Vaping Use: Never used   ?Substance Use Topics  ? Alcohol use: Not Currently  ?  Comment: 10 drinks a week  ? Drug use: Not Currently  ?  Comment: Edibles occasionally  marijuana  ? ? ?Current Outpatient Medications  ?Medication Sig Dispense Refill  ? albuterol (VENTOLIN HFA) 108 (90 Base) MCG/ACT inhaler Inhale 2 puffs into the lungs every 6 (six) hours as needed for wheezing or shortness of breath.    ? loratadine (CLARITIN) 10 MG tablet Take 10 mg by mouth daily.    ? metoprolol tartrate (LOPRESSOR) 25 MG tablet Take 1 tablet (25 mg total) by mouth 2 (two) times daily. 180 tablet 3  ? ?Current Facility-Administered Medications  ?Medication Dose Route Frequency Provider Last Rate Last Admin  ? sodium chloride flush (NS) 0.9 % injection 3 mL  3 mL Intravenous Q12H Orbie Pyo, MD      ? ? ?Allergies  ?Allergen Reactions  ? Amoxicillin Hives  ? ? ?Review of Systems  ?Constitutional:  Positive for activity change and fatigue.  ?HENT:  Negative for trouble swallowing.   ?Eyes:  Negative for visual disturbance.  ?Respiratory:  Positive for wheezing.   ?Cardiovascular:  Negative for chest pain and leg swelling.  ?Genitourinary: Negative.   ?Neurological:  Positive for headaches. Negative for seizures and syncope.  ?Hematological:  Negative for adenopathy. Does not bruise/bleed easily.  ?All other systems  reviewed and are negative. ? ?BP (!) 145/90 (BP Location: Right Arm, Patient Position: Sitting)   Pulse 66   Resp 20   Ht  (1.803 m)   Wt 198 lb (89.8 kg)   SpO2 100% Comment: RA  BMI 27.62 kg/m?  ?Physical Exam ?Vitals reviewed.  ?Constitutional:   ?   General: He is not in acute distress. ?   Appearance: Normal appearance.  ?HENT:  ?   Head: Normocephalic and atraumatic.  ?Eyes:  ?   General: No scleral icterus. ?   Extraocular Movements: Extraocular movements intact.  ?Neck:  ?   Vascular: No carotid bruit.  ?Cardiovascular:  ?   Rate and Rhythm: Normal rate and regular rhythm.  ?   Pulses: Normal pulses.  ?   Heart sounds:  Murmur (Prominent murmur with 3/6 systolic and diastolic components) heard.  ?Pulmonary:  ?   Effort: Pulmonary effort is normal. No respiratory distress.  ?   Breath sounds: Normal breath sounds. No wheezing or rales.  ?Abdominal:  ?   General: There is no distension.  ?   Palpations: Abdomen is soft.  ?Musculoskeletal:  ?   Right lower leg: No edema.  ?   Left lower leg: No edema.  ?Skin: ?   General: Skin is warm and dry.  ?Neurological:  ?   General: No focal deficit present.  ?   Mental Status: He is alert and oriented to person, place, and time.  ?   Cranial Nerves: No cranial nerve deficit.  ?   Motor: No weakness.  ? ? ?Diagnostic Tests: ?IMPRESSIONS  ? ? ? 1. Left ventricular ejection fraction, by estimation, is 60 to 65%. The  ?left ventricle has normal function. The left ventricle has no regional  ?wall motion abnormalities. There is mild left ventricular hypertrophy.  ?Left ventricular diastolic parameters  ?were normal.  ? 2. Right ventricular systolic function is normal. The right ventricular  ?size is normal. There is normal pulmonary artery systolic pressure. The  ?estimated right ventricular systolic pressure is 20.5 mmHg.  ? 3. Left atrial size was mildly dilated.  ? 4. The mitral valve is normal in structure. Trivial mitral valve  ?regurgitation.  ? 5. The inferior vena cava is normal in size with greater than 50%  ?respiratory variability, suggesting right atrial pressure of 3 mmHg.  ? 6. The aortic valve was not well visualized. Aortic valve regurgitation  ?is moderate to severe. Moderate aortic valve stenosis. Vmax 3.2 m/s, MG 23  ?mmHg, AVA 1.5 cm^2, DI 0.27. Eccentric AI jet appears moderate visually,  ?but likely underestimating due to  ? eccentric jet. There does appear to be holodiastolic flow reversal in  ?aorta, argues for severe AI  ? 7. Aortic dilatation noted. 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  ? ?I personally reviewed the echocardiogram images.  Abnormal bicuspid  aortic valve with calcification, moderate AAS, moderate to severe AI, dilated aortic root, preserved left ventricular function. ? ?CT ANGIOGRAPHY CHEST WITH CONTRAST ?  ?TECHNIQUE: ?Multidetector CT imaging of the chest was performed using the ?standard protocol during bolus administration of intravenous ?contrast. Multiplanar CT image reconstructions and MIPs were ?obtained to evaluate the vascular anatomy. ?  ?RADIATION DOSE REDUCTION: This exam was performed according to the ?departmental dose-optimization program which includes automated ?exposure control, adjustment of the mA and/or kV according to ?patient size and/or use of iterative reconstruction technique. ?  ?CONTRAST:  100 mL , intravenous ?  ?COMPARISON:  None. ?  ?FINDINGS: ?Cardiovascular: Preferential opacification of the thoracic aorta. No ?evidence of thoracic aortic aneurysm or dissection. Normal heart ?size. No pericardial effusion. ?  ?Sinues of Valsalva: Bicuspid appearance with scattered ?calcifications measuring no greater than 37 mm in maximum short axis ?diameter. ?  ?Sinotubular Junction: 33 mm ?  ?Ascending Aorta: 45 mm ?  ?Aortic Arch: 40 mm ?  ?Descending aorta: 27 mm at the level of the carina ,unchanged ?  ?Branch vessels: Conventional branching pattern. No significant ?atherosclerotic changes. ?  ?Coronary arteries: Normal origins and courses. No significant ?atherosclerotic calcifications. ?  ?Main pulmonary artery: 28 mm 8 that. No evidence of central ?pulmonary embolism. ?  ?Pulmonary veins: No anomalous pulmonary venous return. No evidence ?of left atrial appendage thrombus. ?  ?Upper abdominal vasculature: Within normal limits. ?  ?Mediastinum/Nodes: No enlarged mediastinal, hilar, or axillary lymph ?nodes. Thyroid gland, trachea, and esophagus demonstrate no ?significant findings. ?  ?Lungs/Pleura: No focal consolidations. No suspicious pulmonary ?nodules. No pleural effusion or pneumothorax. ?  ?Upper Abdomen: Diffuse  decreased attenuation of the hepatic ?parenchyma. No focal masses. The remaining visualized upper abdomen ?is within normal limits. ?  ?Musculoskeletal: No chest wall abnormality. No acute or significant ?osseous findin

## 2021-09-11 NOTE — Progress Notes (Signed)
PCP is [name], NP ?Referring Provider is Orbie Pyo, MD ? ?Chief Complaint  ?Patient presents with  ? Consult  ?  Initial surgical consult, Severe Aortic Regurgitation, TAA CTA 3/23, ECHO 3/20, cath 3/20  ? ? ?HPI: [name] is sent for consultation regarding abnormal bicuspid aortic valve and ascending aneurysm. ? ?[name] [name] is a 31 year old man with a longstanding heart murmur and a known bicuspid aortic valve.  Past medical history is also significant for pancreatitis, asthma, pneumonia and migraines.  He recently was being evaluated for a right inguinal hernia repair.  He was sent for medical clearance due to his heart murmur.  He was referred to Dr. Lynnette Caffey.  An echocardiogram showed a bicuspid aortic valve with moderate AS and severe AI.  There also was a dilated ascending aorta.  It was estimated at 5.2 cm.  A CT angio of the chest showed the aneurysm actually measures about 4.5 to 4.6 cm.  He had cardiac catheterization which revealed normal coronary arteries.  Right heart cath showed normal filling pressures and preserved cardiac index. ? ?He is not having any chest pain.  He does get short of breath with heavy exertion but nothing out of the ordinary.  He does complain of fatigue. ? ? ?Past Medical History:  ?Diagnosis Date  ? Asthma   ? Headache   ? migraines  ? Heart murmur   ? Mild aortic stenosis   ? Pancreatitis   ? Pneumonia   ? as a child x2  ? ? ?Past Surgical History:  ?Procedure Laterality Date  ? NO PAST SURGERIES    ? RIGHT HEART CATH AND CORONARY ANGIOGRAPHY N/A 09/07/2021  ? Procedure: RIGHT HEART CATH AND CORONARY ANGIOGRAPHY;  Surgeon: Orbie Pyo, MD;  Location: [facility];  Service: Cardiovascular;  Laterality: N/A;  ? ? ?Family History  ?Problem Relation Age of Onset  ? Pancreatitis Maternal Uncle   ? ? ?Social History ?Social History  ? ?Tobacco Use  ? Smoking status: Never  ? Smokeless tobacco: Never  ?Vaping Use  ? Vaping Use: Never used   ?Substance Use Topics  ? Alcohol use: Not Currently  ?  Comment: 10 drinks a week  ? Drug use: Not Currently  ?  Comment: Edibles occasionally  marijuana  ? ? ?Current Outpatient Medications  ?Medication Sig Dispense Refill  ? albuterol (VENTOLIN HFA) 108 (90 Base) MCG/ACT inhaler Inhale 2 puffs into the lungs every 6 (six) hours as needed for wheezing or shortness of breath.    ? loratadine (CLARITIN) 10 MG tablet Take 10 mg by mouth daily.    ? metoprolol tartrate (LOPRESSOR) 25 MG tablet Take 1 tablet (25 mg total) by mouth 2 (two) times daily. 180 tablet 3  ? ?Current Facility-Administered Medications  ?Medication Dose Route Frequency Provider Last Rate Last Admin  ? sodium chloride flush (NS) 0.9 % injection 3 mL  3 mL Intravenous Q12H Orbie Pyo, MD      ? ? ?Allergies  ?Allergen Reactions  ? Amoxicillin Hives  ? ? ?Review of Systems  ?Constitutional:  Positive for activity change and fatigue.  ?HENT:  Negative for trouble swallowing.   ?Eyes:  Negative for visual disturbance.  ?Respiratory:  Positive for wheezing.   ?Cardiovascular:  Negative for chest pain and leg swelling.  ?Genitourinary: Negative.   ?Neurological:  Positive for headaches. Negative for seizures and syncope.  ?Hematological:  Negative for adenopathy. Does not bruise/bleed easily.  ?All other systems  reviewed and are negative. ? ?BP (!) 145/90 (BP Location: Right Arm, Patient Position: Sitting)   Pulse 66   Resp 20   Ht 5' 11" (1.803 m)   Wt 198 lb (89.8 kg)   SpO2 100% Comment: RA  BMI 27.62 kg/m?  ?Physical Exam ?Vitals reviewed.  ?Constitutional:   ?   General: He is not in acute distress. ?   Appearance: Normal appearance.  ?HENT:  ?   Head: Normocephalic and atraumatic.  ?Eyes:  ?   General: No scleral icterus. ?   Extraocular Movements: Extraocular movements intact.  ?Neck:  ?   Vascular: No carotid bruit.  ?Cardiovascular:  ?   Rate and Rhythm: Normal rate and regular rhythm.  ?   Pulses: Normal pulses.  ?   Heart sounds:  Murmur (Prominent murmur with 3/6 systolic and diastolic components) heard.  ?Pulmonary:  ?   Effort: Pulmonary effort is normal. No respiratory distress.  ?   Breath sounds: Normal breath sounds. No wheezing or rales.  ?Abdominal:  ?   General: There is no distension.  ?   Palpations: Abdomen is soft.  ?Musculoskeletal:  ?   Right lower leg: No edema.  ?   Left lower leg: No edema.  ?Skin: ?   General: Skin is warm and dry.  ?Neurological:  ?   General: No focal deficit present.  ?   Mental Status: He is alert and oriented to person, place, and time.  ?   Cranial Nerves: No cranial nerve deficit.  ?   Motor: No weakness.  ? ? ?Diagnostic Tests: ?IMPRESSIONS  ? ? ? 1. Left ventricular ejection fraction, by estimation, is 60 to 65%. The  ?left ventricle has normal function. The left ventricle has no regional  ?wall motion abnormalities. There is mild left ventricular hypertrophy.  ?Left ventricular diastolic parameters  ?were normal.  ? 2. Right ventricular systolic function is normal. The right ventricular  ?size is normal. There is normal pulmonary artery systolic pressure. The  ?estimated right ventricular systolic pressure is 20.5 mmHg.  ? 3. Left atrial size was mildly dilated.  ? 4. The mitral valve is normal in structure. Trivial mitral valve  ?regurgitation.  ? 5. The inferior vena cava is normal in size with greater than 50%  ?respiratory variability, suggesting right atrial pressure of 3 mmHg.  ? 6. The aortic valve was not well visualized. Aortic valve regurgitation  ?is moderate to severe. Moderate aortic valve stenosis. Vmax 3.2 m/s, MG 23  ?mmHg, AVA 1.5 cm^2, DI 0.27. Eccentric AI jet appears moderate visually,  ?but likely underestimating due to  ? eccentric jet. There does appear to be holodiastolic flow reversal in  ?aorta, argues for severe AI  ? 7. Aortic dilatation noted. 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  ? ?I personally reviewed the echocardiogram images.  Abnormal bicuspid  aortic valve with calcification, moderate AAS, moderate to severe AI, dilated aortic root, preserved left ventricular function. ? ?CT ANGIOGRAPHY CHEST WITH CONTRAST ?  ?TECHNIQUE: ?Multidetector CT imaging of the chest was performed using the ?standard protocol during bolus administration of intravenous ?contrast. Multiplanar CT image reconstructions and MIPs were ?obtained to evaluate the vascular anatomy. ?  ?RADIATION DOSE REDUCTION: This exam was performed according to the ?departmental dose-optimization program which includes automated ?exposure control, adjustment of the mA and/or kV according to ?patient size and/or use of iterative reconstruction technique. ?  ?CONTRAST:  100 mL , intravenous ?  ?COMPARISON:    None. ?  ?FINDINGS: ?Cardiovascular: Preferential opacification of the thoracic aorta. No ?evidence of thoracic aortic aneurysm or dissection. Normal heart ?size. No pericardial effusion. ?  ?Sinues of Valsalva: Bicuspid appearance with scattered ?calcifications measuring no greater than 37 mm in maximum short axis ?diameter. ?  ?Sinotubular Junction: 33 mm ?  ?Ascending Aorta: 45 mm ?  ?Aortic Arch: 40 mm ?  ?Descending aorta: 27 mm at the level of the carina ,unchanged ?  ?Branch vessels: Conventional branching pattern. No significant ?atherosclerotic changes. ?  ?Coronary arteries: Normal origins and courses. No significant ?atherosclerotic calcifications. ?  ?Main pulmonary artery: 28 mm 8 that. No evidence of central ?pulmonary embolism. ?  ?Pulmonary veins: No anomalous pulmonary venous return. No evidence ?of left atrial appendage thrombus. ?  ?Upper abdominal vasculature: Within normal limits. ?  ?Mediastinum/Nodes: No enlarged mediastinal, hilar, or axillary lymph ?nodes. Thyroid gland, trachea, and esophagus demonstrate no ?significant findings. ?  ?Lungs/Pleura: No focal consolidations. No suspicious pulmonary ?nodules. No pleural effusion or pneumothorax. ?  ?Upper Abdomen: Diffuse  decreased attenuation of the hepatic ?parenchyma. No focal masses. The remaining visualized upper abdomen ?is within normal limits. ?  ?Musculoskeletal: No chest wall abnormality. No acute or significant ?osseous findin

## 2021-09-22 ENCOUNTER — Encounter (HOSPITAL_COMMUNITY): Payer: Self-pay

## 2021-09-22 NOTE — Progress Notes (Signed)
TWO VISITORS ARE ALLOWED TO COME WITH YOU AND STAY IN THE SURGICAL WAITING ROOM ONLY DURING PRE OP AND PROCEDURE DAY OF SURGERY.  ? ?Two VISITORS MAY VISIT WITH YOU AFTER SURGERY IN YOUR PRIVATE ROOM DURING VISITING HOURS ONLY! ? ?PCP - Jeanie Sewer, NP ?Cardiologist - Dr Faylene Million ? ?Chest x-ray - 09/25/21 ?EKG - 09/11/21 ?Stress Test - n/a ?ECHO - 09/11/21 ?Cardiac Cath - 09/07/21 ? ?ICD Pacemaker/Loop - n/a ? ?Sleep Study -  n/a ?CPAP - none ? ?Anesthesia review: Yes ? ?Coronavirus Screening ?Covid test at PAT appt 09/25/21. ?Do you have any of the following symptoms:  ?Cough yes/no: No ?Fever (>100.85F)  yes/no: No ?Runny nose yes/no: No ?Sore throat yes/no: No ?Difficulty breathing/shortness of breath  yes/no: No ? ?Have you traveled in the last 14 days and where? yes/no: No ? ?Patient verbalized understanding of instructions that were given to them at the PAT appointment. Patient was also instructed that they will need to review over the PAT instructions again at home before surgery. ? ?

## 2021-09-22 NOTE — Progress Notes (Signed)
Surgical Instructions ? ? ? Your procedure is scheduled on Wednesday April 19th. ? Report to [facility] Main Entrance "A" at 6:30 A.M., then check in with the Admitting office. ? Call this number if you have problems the morning of surgery: ? [phone] ? ? If you have any questions prior to your surgery date call [phone]: Open Monday-Friday 8am-4pm ? ? ? Remember: ? Do not eat or drink after midnight the night before your surgery ? ? Take these medicines the morning of surgery with A SIP OF WATER: ?Per surgeon, take usual cardiac medications (calcium-channel blockers, beta blockers, digoxin, or nitrates) the morning of surgery with a sip of water (no other liquids). ? ?metoprolol tartrate (LOPRESSOR) 25 MG tablet ? ?IF NEEDED  ?albuterol (VENTOLIN HFA) 108 (90 Base) MCG/ACT inhaler- please bring with you to the hospital ? ? ?As of today, STOP taking any Aspirin (unless otherwise instructed by your surgeon) Aleve, Naproxen, Ibuprofen, Motrin, Advil, Goody's, BC's, all herbal medications, fish oil, and all vitamins. ? ?         ?Do not wear jewelry  ?Do not wear lotions, powders, colognes, or deodorant. ?Do not shave 48 hours prior to surgery.  Men may shave face and neck. ?Do not bring valuables to the hospital. ?Do not wear nail polish ? ?[facility] is not responsible for any belongings or valuables. .  ? ?Do NOT Smoke (Tobacco/Vaping)  24 hours prior to your procedure ? ?If you use a CPAP at night, you may bring your mask for your overnight stay. ?  ?Contacts, glasses, hearing aids, dentures or partials may not be worn into surgery, please bring cases for these belongings ?  ?For patients admitted to the hospital, discharge time will be determined by your treatment team. ?  ?Patients discharged the day of surgery will not be allowed to drive home, and someone needs to stay with them for 24 hours. ? ? ?SURGICAL WAITING ROOM VISITATION ?Patients having surgery or a procedure in a hospital may have two  support people. ?Children under the age of 38 must have an adult with them who is not the patient. ?They may stay in the waiting area during the procedure and may switch out with other visitors. If the patient needs to stay at the hospital during part of their recovery, the visitor guidelines for inpatient rooms apply. ? ?Please refer to the [facility] website for the visitor guidelines for Inpatients (after your surgery is over and you are in a regular room).  ? ? ? ? ? ?Special instructions:   ? ?Oral Hygiene is also important to reduce your risk of infection.  Remember - BRUSH YOUR TEETH THE MORNING OF SURGERY WITH YOUR REGULAR TOOTHPASTE ? ? ?[facility]- Preparing For Surgery ? ?Before surgery, you can play an important role. Because skin is not sterile, your skin needs to be as free of germs as possible. You can reduce the number of germs on your skin by washing with CHG (chlorahexidine gluconate) Soap before surgery.  CHG is an antiseptic cleaner which kills germs and bonds with the skin to continue killing germs even after washing.   ? ? ?Please do not use if you have an allergy to CHG or antibacterial soaps. If your skin becomes reddened/irritated stop using the CHG.  ?Do not shave (including legs and underarms) for at least 48 hours prior to first CHG shower. It is OK to shave your face. ? ?Please follow these instructions carefully. ?  ? ?  Shower the NIGHT BEFORE SURGERY and the MORNING OF SURGERY with CHG Soap.  ? If you chose to wash your hair, wash your hair first as usual with your normal shampoo. After you shampoo, rinse your hair and body thoroughly to remove the shampoo.  Then [employer] and genitals (private parts) with your normal soap and rinse thoroughly to remove soap. ? ?After that Use CHG Soap as you would any other liquid soap. You can apply CHG directly to the skin and wash gently with a scrungie or a clean washcloth.  ? ?Apply the CHG Soap to your body ONLY FROM THE NECK DOWN.  Do not use  on open wounds or open sores. Avoid contact with your eyes, ears, mouth and genitals (private parts). Wash Face and genitals (private parts)  with your normal soap.  ? ?Wash thoroughly, paying special attention to the area where your surgery will be performed. ? ?Thoroughly rinse your body with warm water from the neck down. ? ?DO NOT shower/wash with your normal soap after using and rinsing off the CHG Soap. ? ?Pat yourself dry with a CLEAN TOWEL. ? ?Wear CLEAN PAJAMAS to bed the night before surgery ? ?Place CLEAN SHEETS on your bed the night before your surgery ? ?DO NOT SLEEP WITH PETS. ? ? ?Day of Surgery: ? ?Take a shower with CHG soap. ?Wear Clean/Comfortable clothing the morning of surgery ?Do not apply any deodorants/lotions.   ?Remember to brush your teeth WITH YOUR REGULAR TOOTHPASTE. ? ? ? ?If you received a COVID test during your pre-op visit  it is requested that you wear a mask when out in public, stay away from anyone that may not be feeling well and notify your surgeon if you develop symptoms. If you have been in contact with anyone that has tested positive in the last 10 days please notify you surgeon. ? ?  ?Please read over the following fact sheets that you were given.  ? ?

## 2021-09-25 ENCOUNTER — Encounter (HOSPITAL_COMMUNITY)
Admission: RE | Admit: 2021-09-25 | Discharge: 2021-09-25 | Disposition: A | Discharge status: Home / Self Care | Payer: Commercial Managed Care - PPO | Source: Ambulatory Visit | Attending: Thoracic Surgery (Cardiothoracic Vascular Surgery) | Admitting: Thoracic Surgery (Cardiothoracic Vascular Surgery)

## 2021-09-25 ENCOUNTER — Ambulatory Visit (HOSPITAL_COMMUNITY)
Admission: RE | Admit: 2021-09-25 | Discharge: 2021-09-25 | Disposition: A | Discharge status: Home / Self Care | Payer: Commercial Managed Care - PPO | Source: Ambulatory Visit | Attending: Thoracic Surgery (Cardiothoracic Vascular Surgery) | Admitting: Thoracic Surgery (Cardiothoracic Vascular Surgery)

## 2021-09-25 ENCOUNTER — Other Ambulatory Visit: Payer: Self-pay

## 2021-09-25 ENCOUNTER — Encounter (HOSPITAL_COMMUNITY): Payer: Self-pay

## 2021-09-25 VITALS — BP 129/91 | HR 66 | Temp 98.0°F | Resp 18 | Ht 71.0 in | Wt 199.0 lb

## 2021-09-25 DIAGNOSIS — I712 Thoracic aortic aneurysm, without rupture, unspecified: Secondary | ICD-10-CM | POA: Insufficient documentation

## 2021-09-25 DIAGNOSIS — Z01818 Encounter for other preprocedural examination: Secondary | ICD-10-CM

## 2021-09-25 DIAGNOSIS — Z20822 Contact with and (suspected) exposure to covid-19: Secondary | ICD-10-CM | POA: Insufficient documentation

## 2021-09-25 DIAGNOSIS — I350 Nonrheumatic aortic (valve) stenosis: Secondary | ICD-10-CM | POA: Insufficient documentation

## 2021-09-25 LAB — CBC
HCT: 39.1 % (ref 39.0–52.0)
Hemoglobin: 13.3 g/dL (ref 13.0–17.0)
MCH: 31.7 pg (ref 26.0–34.0)
MCHC: 34 g/dL (ref 30.0–36.0)
MCV: 93.3 fL (ref 80.0–100.0)
Platelets: 217 10*3/uL (ref 150–400)
RBC: 4.19 MIL/uL — ABNORMAL LOW (ref 4.22–5.81)
RDW: 12.7 % (ref 11.5–15.5)
WBC: 5 10*3/uL (ref 4.0–10.5)
nRBC: 0 % (ref 0.0–0.2)

## 2021-09-25 LAB — COMPREHENSIVE METABOLIC PANEL
ALT: 22 U/L (ref 0–44)
AST: 26 U/L (ref 15–41)
Albumin: 4.4 g/dL (ref 3.5–5.0)
Alkaline Phosphatase: 53 U/L (ref 38–126)
Anion gap: 8 (ref 5–15)
BUN: 9 mg/dL (ref 6–20)
CO2: 24 mmol/L (ref 22–32)
Calcium: 9.3 mg/dL (ref 8.9–10.3)
Chloride: 107 mmol/L (ref 98–111)
Creatinine, Ser: 0.74 mg/dL (ref 0.61–1.24)
GFR, Estimated: >60 mL/min (ref >60)
Glucose, Bld: 120 mg/dL — ABNORMAL HIGH (ref 70–99)
Potassium: 4.2 mmol/L (ref 3.5–5.1)
Sodium: 139 mmol/L (ref 135–145)
Total Bilirubin: 0.7 mg/dL (ref 0.3–1.2)
Total Protein: 7.2 g/dL (ref 6.5–8.1)

## 2021-09-25 LAB — URINALYSIS, ROUTINE W REFLEX MICROSCOPIC
Bilirubin Urine: NEGATIVE
Glucose, UA: NEGATIVE mg/dL
Hgb urine dipstick: NEGATIVE
Ketones, ur: NEGATIVE mg/dL
Leukocytes,Ua: NEGATIVE
Nitrite: NEGATIVE
Protein, ur: NEGATIVE mg/dL
Specific Gravity, Urine: 1.019 (ref 1.005–1.030)
pH: 7 (ref 5.0–8.0)

## 2021-09-25 LAB — HEMOGLOBIN A1C
Hgb A1c MFr Bld: 5.3 % (ref 4.8–5.6)
Mean Plasma Glucose: 105.41 mg/dL

## 2021-09-25 LAB — BLOOD GAS, ARTERIAL
Acid-Base Excess: 3.2 mmol/L — ABNORMAL HIGH (ref 0.0–2.0)
Bicarbonate: 27.9 mmol/L (ref 20.0–28.0)
Drawn by: 58793
O2 Saturation: 99.1 %
Patient temperature: 37
pCO2 arterial: 42 mmHg (ref 32–48)
pH, Arterial: 7.43 (ref 7.35–7.45)
pO2, Arterial: 111 mmHg — ABNORMAL HIGH (ref 83–108)

## 2021-09-25 LAB — PROTIME-INR
INR: 0.9 (ref 0.8–1.2)
Prothrombin Time: 12.4 seconds (ref 11.4–15.2)

## 2021-09-25 LAB — APTT: aPTT: 29 seconds (ref 24–36)

## 2021-09-25 LAB — SURGICAL PCR SCREEN
MRSA, PCR: NEGATIVE
Staphylococcus aureus: POSITIVE — AB

## 2021-09-25 LAB — SARS CORONAVIRUS 2 (TAT 6-24 HRS): SARS Coronavirus 2: NEGATIVE

## 2021-09-26 ENCOUNTER — Encounter (HOSPITAL_COMMUNITY): Payer: Self-pay | Admitting: Thoracic Surgery (Cardiothoracic Vascular Surgery)

## 2021-09-26 MED ORDER — SODIUM CHLORIDE 0.9 % IV SOLN
1.5000 mg/kg/h | INTRAVENOUS | Status: AC
  Administered 2021-09-27: 1.5 mg/kg/h via INTRAVENOUS
  Filled 2021-09-26: qty 25

## 2021-09-26 MED ORDER — DEXMEDETOMIDINE HCL IN NACL 400 MCG/100ML IV SOLN
0.1000 ug/kg/h | INTRAVENOUS | Status: AC
  Administered 2021-09-27: .5 ug/kg/h via INTRAVENOUS
  Filled 2021-09-26: qty 100

## 2021-09-26 MED ORDER — VANCOMYCIN HCL 1500 MG/300ML IV SOLN
1500.0000 mg | INTRAVENOUS | Status: AC
  Administered 2021-09-27: 1500 mg via INTRAVENOUS
  Filled 2021-09-26: qty 300

## 2021-09-26 MED ORDER — CEFAZOLIN SODIUM-DEXTROSE 2-4 GM/100ML-% IV SOLN
2.0000 g | INTRAVENOUS | Status: DC
  Filled 2021-09-26: qty 100

## 2021-09-26 MED ORDER — TRANEXAMIC ACID (OHS) BOLUS VIA INFUSION
15.0000 mg/kg | INTRAVENOUS | Status: AC
Start: 2021-09-27 — End: 2021-09-27
  Administered 2021-09-27: 1354.5 mg via INTRAVENOUS
  Filled 2021-09-26: qty 1355

## 2021-09-26 MED ORDER — MILRINONE LACTATE IN DEXTROSE 20-5 MG/100ML-% IV SOLN
0.3000 ug/kg/min | INTRAVENOUS | Status: DC
Start: 2021-09-27 — End: 2021-09-27
  Filled 2021-09-26: qty 100

## 2021-09-26 MED ORDER — CEFAZOLIN SODIUM-DEXTROSE 2-4 GM/100ML-% IV SOLN
2.0000 g | INTRAVENOUS | Status: AC
  Administered 2021-09-27 (×2): 2 g via INTRAVENOUS
  Filled 2021-09-26: qty 100

## 2021-09-26 MED ORDER — HEPARIN 30,000 UNITS/1000 ML (OHS) CELLSAVER SOLUTION
Status: DC
  Filled 2021-09-26: qty 1000

## 2021-09-26 MED ORDER — PHENYLEPHRINE HCL-NACL 20-0.9 MG/250ML-% IV SOLN
30.0000 ug/min | INTRAVENOUS | Status: AC
  Administered 2021-09-27: 20 ug/min via INTRAVENOUS
  Filled 2021-09-26: qty 250

## 2021-09-26 MED ORDER — EPINEPHRINE HCL 5 MG/250ML IV SOLN IN NS
0.0000 ug/min | INTRAVENOUS | Status: DC
  Filled 2021-09-26: qty 250

## 2021-09-26 MED ORDER — INSULIN REGULAR(HUMAN) IN NACL 100-0.9 UT/100ML-% IV SOLN
INTRAVENOUS | Status: AC
  Administered 2021-09-27: 1.2 [IU]/h via INTRAVENOUS
  Filled 2021-09-26: qty 100

## 2021-09-26 MED ORDER — NOREPINEPHRINE 4 MG/250ML-% IV SOLN
0.0000 ug/min | INTRAVENOUS | Status: DC
  Filled 2021-09-26: qty 250

## 2021-09-26 MED ORDER — MAGNESIUM SULFATE 50 % IJ SOLN
40.0000 meq | INTRAMUSCULAR | Status: DC
  Filled 2021-09-26: qty 9.85

## 2021-09-26 MED ORDER — POTASSIUM CHLORIDE 2 MEQ/ML IV SOLN
80.0000 meq | INTRAVENOUS | Status: DC
  Filled 2021-09-26: qty 40

## 2021-09-26 MED ORDER — PLASMA-LYTE A IV SOLN
INTRAVENOUS | Status: DC
  Filled 2021-09-26: qty 2.5

## 2021-09-26 MED ORDER — NITROGLYCERIN IN D5W 200-5 MCG/ML-% IV SOLN
2.0000 ug/min | INTRAVENOUS | Status: DC
  Filled 2021-09-26: qty 250

## 2021-09-26 MED ORDER — TRANEXAMIC ACID (OHS) PUMP PRIME SOLUTION
2.0000 mg/kg | INTRAVENOUS | Status: DC
Start: 2021-09-27 — End: 2021-09-27
  Filled 2021-09-26: qty 1.81

## 2021-09-26 NOTE — Anesthesia Preprocedure Evaluation (Addendum)
Anesthesia Evaluation  ?Patient identified by MRN, date of birth, ID band ?Patient awake ? ? ? ?Reviewed: ?Allergy & Precautions, NPO status , Patient's Chart, lab work & pertinent test results, reviewed documented beta blocker date and time  ? ?History of Anesthesia Complications ?Negative for: history of anesthetic complications ? ?Airway ?Mallampati: II ? ?TM Distance: >3 FB ?Neck ROM: Full ? ? ? Dental ? ?(+) Dental Advisory Given, Chipped ?  ?Pulmonary ?asthma ,  ?  ?Pulmonary exam normal ? ? ? ? ? ? ? Cardiovascular ?+ Valvular Problems/Murmurs AS and AI  ?Rhythm:Regular Rate:Bradycardia ?+ Systolic murmurs and + Diastolic murmurs ? ?'23 TTE - EF 60 to 65%. There is mild left ventricular hypertrophy. Left atrial size was mildly dilated. Trivial mitral valve regurgitation. Aortic valve regurgitation is moderate to severe. Moderate aortic valve stenosis. Vmax 3.2 m/s, MG 23 mmHg, AVA 1.5 cm^2, DI 0.27. Eccentric AI jet appears moderate visually, but likely underestimating due to?eccentric jet. There does appear to be holodiastolic flow reversal in aorta, argues for severe AI. Aortic dilatation noted. Aneurysm of the ascending aorta, measuring 52 mm. ?  ?  ?Neuro/Psych ? Headaches, negative psych ROS  ? GI/Hepatic ?negative GI ROS, Neg liver ROS,   ?Endo/Other  ?negative endocrine ROS ? Renal/GU ?negative Renal ROS  ? ?  ?Musculoskeletal ?negative musculoskeletal ROS ?(+)  ? Abdominal ?  ?Peds ? Hematology ?negative hematology ROS ?(+)   ?Anesthesia Other Findings ? ? Reproductive/Obstetrics ? ?  ? ? ? ? ? ? ? ? ? ? ? ? ? ?  ?  ? ? ? ? ? ? ? ?Anesthesia Physical ?Anesthesia Plan ? ?ASA: 3 ? ?Anesthesia Plan: General  ? ?Post-op Pain Management:   ? ?Induction: Intravenous ? ?PONV Risk Score and Plan: 2 and Treatment may vary due to age or medical condition and Ondansetron ? ?Airway Management Planned: Oral ETT ? ?Additional Equipment: Arterial line, CVP, PA Cath, TEE and  Ultrasound Guidance Line Placement ? ?Intra-op Plan: Delibrate Circulatory arrest per surgeon request ? ?Post-operative Plan: Post-operative intubation/ventilation ? ?Informed Consent: I have reviewed the patients History and Physical, chart, labs and discussed the procedure including the risks, benefits and alternatives for the proposed anesthesia with the patient or authorized representative who has indicated his/her understanding and acceptance.  ? ? ? ?Dental advisory given ? ?Plan Discussed with: CRNA and Anesthesiologist ? ?Anesthesia Plan Comments:   ? ? ? ? ? ?Anesthesia Quick Evaluation ? ?

## 2021-09-27 ENCOUNTER — Inpatient Hospital Stay (HOSPITAL_COMMUNITY): Payer: Commercial Managed Care - PPO | Admitting: Certified Registered Nurse Anesthetist

## 2021-09-27 ENCOUNTER — Inpatient Hospital Stay (HOSPITAL_COMMUNITY): Payer: Commercial Managed Care - PPO

## 2021-09-27 ENCOUNTER — Inpatient Hospital Stay (HOSPITAL_COMMUNITY): Payer: Commercial Managed Care - PPO | Admitting: Physician Assistant

## 2021-09-27 ENCOUNTER — Other Ambulatory Visit: Payer: Self-pay

## 2021-09-27 ENCOUNTER — Encounter (HOSPITAL_COMMUNITY)
Admission: RE | Disposition: A | Discharge status: Home / Self Care | Payer: Self-pay | Source: Home / Self Care | Attending: Thoracic Surgery (Cardiothoracic Vascular Surgery)

## 2021-09-27 ENCOUNTER — Inpatient Hospital Stay (HOSPITAL_COMMUNITY)
Admission: RE | Admit: 2021-09-27 | Discharge: 2021-10-02 | DRG: 220 | Disposition: A | Discharge status: Home / Self Care | Payer: Commercial Managed Care - PPO | Attending: Thoracic Surgery (Cardiothoracic Vascular Surgery) | Admitting: Thoracic Surgery (Cardiothoracic Vascular Surgery)

## 2021-09-27 ENCOUNTER — Encounter (HOSPITAL_COMMUNITY): Payer: Self-pay | Admitting: Thoracic Surgery (Cardiothoracic Vascular Surgery)

## 2021-09-27 DIAGNOSIS — D62 Acute posthemorrhagic anemia: Secondary | ICD-10-CM | POA: Diagnosis not present

## 2021-09-27 DIAGNOSIS — Q231 Congenital insufficiency of aortic valve: Secondary | ICD-10-CM | POA: Diagnosis present

## 2021-09-27 DIAGNOSIS — Z79899 Other long term (current) drug therapy: Secondary | ICD-10-CM | POA: Diagnosis not present

## 2021-09-27 DIAGNOSIS — Z20822 Contact with and (suspected) exposure to covid-19: Secondary | ICD-10-CM | POA: Diagnosis present

## 2021-09-27 DIAGNOSIS — D689 Coagulation defect, unspecified: Secondary | ICD-10-CM | POA: Diagnosis present

## 2021-09-27 DIAGNOSIS — Z952 Presence of prosthetic heart valve: Principal | ICD-10-CM

## 2021-09-27 DIAGNOSIS — I7121 Aneurysm of the ascending aorta, without rupture: Secondary | ICD-10-CM | POA: Diagnosis present

## 2021-09-27 DIAGNOSIS — I712 Thoracic aortic aneurysm, without rupture, unspecified: Secondary | ICD-10-CM

## 2021-09-27 DIAGNOSIS — E8779 Other fluid overload: Secondary | ICD-10-CM | POA: Diagnosis not present

## 2021-09-27 DIAGNOSIS — Z880 Allergy status to penicillin: Secondary | ICD-10-CM

## 2021-09-27 DIAGNOSIS — D696 Thrombocytopenia, unspecified: Secondary | ICD-10-CM | POA: Diagnosis present

## 2021-09-27 DIAGNOSIS — J45909 Unspecified asthma, uncomplicated: Secondary | ICD-10-CM | POA: Diagnosis present

## 2021-09-27 DIAGNOSIS — I350 Nonrheumatic aortic (valve) stenosis: Secondary | ICD-10-CM

## 2021-09-27 DIAGNOSIS — I352 Nonrheumatic aortic (valve) stenosis with insufficiency: Secondary | ICD-10-CM

## 2021-09-27 DIAGNOSIS — I728 Aneurysm of other specified arteries: Secondary | ICD-10-CM | POA: Diagnosis present

## 2021-09-27 DIAGNOSIS — I351 Nonrheumatic aortic (valve) insufficiency: Secondary | ICD-10-CM | POA: Diagnosis not present

## 2021-09-27 HISTORY — PX: AORTIC VALVE REPLACEMENT: SHX41

## 2021-09-27 HISTORY — PX: REPLACEMENT ASCENDING AORTA: SHX6068

## 2021-09-27 HISTORY — DX: Other psychoactive substance abuse, uncomplicated: F19.10

## 2021-09-27 HISTORY — PX: TEE WITHOUT CARDIOVERSION: SHX5443

## 2021-09-27 LAB — POCT I-STAT, CHEM 8
BUN: 12 mg/dL (ref 6–20)
BUN: 12 mg/dL (ref 6–20)
BUN: 13 mg/dL (ref 6–20)
BUN: 11 mg/dL (ref 6–20)
BUN: 13 mg/dL (ref 6–20)
Calcium, Ion: 1.2 mmol/L (ref 1.15–1.40)
Calcium, Ion: 1.08 mmol/L — ABNORMAL LOW (ref 1.15–1.40)
Calcium, Ion: 1.03 mmol/L — ABNORMAL LOW (ref 1.15–1.40)
Calcium, Ion: 0.87 mmol/L — CL (ref 1.15–1.40)
Calcium, Ion: 0.88 mmol/L — CL (ref 1.15–1.40)
Chloride: 103 mmol/L (ref 98–111)
Chloride: 104 mmol/L (ref 98–111)
Chloride: 100 mmol/L (ref 98–111)
Chloride: 102 mmol/L (ref 98–111)
Chloride: 103 mmol/L (ref 98–111)
Creatinine, Ser: 0.6 mg/dL — ABNORMAL LOW (ref 0.61–1.24)
Creatinine, Ser: 0.7 mg/dL (ref 0.61–1.24)
Creatinine, Ser: 0.6 mg/dL — ABNORMAL LOW (ref 0.61–1.24)
Creatinine, Ser: 0.5 mg/dL — ABNORMAL LOW (ref 0.61–1.24)
Creatinine, Ser: 0.5 mg/dL — ABNORMAL LOW (ref 0.61–1.24)
Glucose, Bld: 108 mg/dL — ABNORMAL HIGH (ref 70–99)
Glucose, Bld: 120 mg/dL — ABNORMAL HIGH (ref 70–99)
Glucose, Bld: 127 mg/dL — ABNORMAL HIGH (ref 70–99)
Glucose, Bld: 132 mg/dL — ABNORMAL HIGH (ref 70–99)
Glucose, Bld: 162 mg/dL — ABNORMAL HIGH (ref 70–99)
HCT: 36 % — ABNORMAL LOW (ref 39.0–52.0)
HCT: 33 % — ABNORMAL LOW (ref 39.0–52.0)
HCT: 27 % — ABNORMAL LOW (ref 39.0–52.0)
HCT: 21 % — ABNORMAL LOW (ref 39.0–52.0)
HCT: 23 % — ABNORMAL LOW (ref 39.0–52.0)
Hemoglobin: 12.2 g/dL — ABNORMAL LOW (ref 13.0–17.0)
Hemoglobin: 11.2 g/dL — ABNORMAL LOW (ref 13.0–17.0)
Hemoglobin: 9.2 g/dL — ABNORMAL LOW (ref 13.0–17.0)
Hemoglobin: 7.1 g/dL — ABNORMAL LOW (ref 13.0–17.0)
Hemoglobin: 7.8 g/dL — ABNORMAL LOW (ref 13.0–17.0)
Potassium: 3.8 mmol/L (ref 3.5–5.1)
Potassium: 4 mmol/L (ref 3.5–5.1)
Potassium: 4 mmol/L (ref 3.5–5.1)
Potassium: 3.9 mmol/L (ref 3.5–5.1)
Potassium: 4.6 mmol/L (ref 3.5–5.1)
Sodium: 140 mmol/L (ref 135–145)
Sodium: 138 mmol/L (ref 135–145)
Sodium: 136 mmol/L (ref 135–145)
Sodium: 138 mmol/L (ref 135–145)
Sodium: 141 mmol/L (ref 135–145)
TCO2: 28 mmol/L (ref 22–32)
TCO2: 26 mmol/L (ref 22–32)
TCO2: 29 mmol/L (ref 22–32)
TCO2: 23 mmol/L (ref 22–32)
TCO2: 27 mmol/L (ref 22–32)

## 2021-09-27 LAB — POCT I-STAT 7, (LYTES, BLD GAS, ICA,H+H)
Acid-Base Excess: 1 mmol/L (ref 0.0–2.0)
Acid-Base Excess: 3 mmol/L — ABNORMAL HIGH (ref 0.0–2.0)
Acid-Base Excess: 2 mmol/L (ref 0.0–2.0)
Acid-base deficit: 2 mmol/L (ref 0.0–2.0)
Acid-base deficit: 2 mmol/L (ref 0.0–2.0)
Acid-base deficit: 3 mmol/L — ABNORMAL HIGH (ref 0.0–2.0)
Acid-base deficit: 2 mmol/L (ref 0.0–2.0)
Acid-base deficit: 3 mmol/L — ABNORMAL HIGH (ref 0.0–2.0)
Bicarbonate: 26.6 mmol/L (ref 20.0–28.0)
Bicarbonate: 29.1 mmol/L — ABNORMAL HIGH (ref 20.0–28.0)
Bicarbonate: 23.9 mmol/L (ref 20.0–28.0)
Bicarbonate: 22.6 mmol/L (ref 20.0–28.0)
Bicarbonate: 25 mmol/L (ref 20.0–28.0)
Bicarbonate: 22.1 mmol/L (ref 20.0–28.0)
Bicarbonate: 22.9 mmol/L (ref 20.0–28.0)
Bicarbonate: 21.9 mmol/L (ref 20.0–28.0)
Calcium, Ion: 1.24 mmol/L (ref 1.15–1.40)
Calcium, Ion: 1.05 mmol/L — ABNORMAL LOW (ref 1.15–1.40)
Calcium, Ion: 1.05 mmol/L — ABNORMAL LOW (ref 1.15–1.40)
Calcium, Ion: 0.82 mmol/L — CL (ref 1.15–1.40)
Calcium, Ion: 0.88 mmol/L — CL (ref 1.15–1.40)
Calcium, Ion: 1.16 mmol/L (ref 1.15–1.40)
Calcium, Ion: 1.24 mmol/L (ref 1.15–1.40)
Calcium, Ion: 1.18 mmol/L (ref 1.15–1.40)
HCT: 36 % — ABNORMAL LOW (ref 39.0–52.0)
HCT: 24 % — ABNORMAL LOW (ref 39.0–52.0)
HCT: 26 % — ABNORMAL LOW (ref 39.0–52.0)
HCT: 20 % — ABNORMAL LOW (ref 39.0–52.0)
HCT: 21 % — ABNORMAL LOW (ref 39.0–52.0)
HCT: 25 % — ABNORMAL LOW (ref 39.0–52.0)
HCT: 18 % — ABNORMAL LOW (ref 39.0–52.0)
HCT: 19 % — ABNORMAL LOW (ref 39.0–52.0)
Hemoglobin: 12.2 g/dL — ABNORMAL LOW (ref 13.0–17.0)
Hemoglobin: 8.2 g/dL — ABNORMAL LOW (ref 13.0–17.0)
Hemoglobin: 8.8 g/dL — ABNORMAL LOW (ref 13.0–17.0)
Hemoglobin: 6.8 g/dL — CL (ref 13.0–17.0)
Hemoglobin: 7.1 g/dL — ABNORMAL LOW (ref 13.0–17.0)
Hemoglobin: 8.5 g/dL — ABNORMAL LOW (ref 13.0–17.0)
Hemoglobin: 6.1 g/dL — CL (ref 13.0–17.0)
Hemoglobin: 6.5 g/dL — CL (ref 13.0–17.0)
O2 Saturation: 100 %
O2 Saturation: 100 %
O2 Saturation: 100 %
O2 Saturation: 100 %
O2 Saturation: 100 %
O2 Saturation: 99 %
O2 Saturation: 99 %
O2 Saturation: 98 %
Patient temperature: 34.5
Patient temperature: 36.8
Patient temperature: 36.8
Potassium: 3.8 mmol/L (ref 3.5–5.1)
Potassium: 5.2 mmol/L — ABNORMAL HIGH (ref 3.5–5.1)
Potassium: 3.4 mmol/L — ABNORMAL LOW (ref 3.5–5.1)
Potassium: 3.9 mmol/L (ref 3.5–5.1)
Potassium: 4.4 mmol/L (ref 3.5–5.1)
Potassium: 4.2 mmol/L (ref 3.5–5.1)
Potassium: 4.1 mmol/L (ref 3.5–5.1)
Potassium: 4 mmol/L (ref 3.5–5.1)
Sodium: 140 mmol/L (ref 135–145)
Sodium: 136 mmol/L (ref 135–145)
Sodium: 138 mmol/L (ref 135–145)
Sodium: 138 mmol/L (ref 135–145)
Sodium: 141 mmol/L (ref 135–145)
Sodium: 141 mmol/L (ref 135–145)
Sodium: 140 mmol/L (ref 135–145)
Sodium: 140 mmol/L (ref 135–145)
TCO2: 28 mmol/L (ref 22–32)
TCO2: 31 mmol/L (ref 22–32)
TCO2: 25 mmol/L (ref 22–32)
TCO2: 24 mmol/L (ref 22–32)
TCO2: 26 mmol/L (ref 22–32)
TCO2: 23 mmol/L (ref 22–32)
TCO2: 24 mmol/L (ref 22–32)
TCO2: 23 mmol/L (ref 22–32)
pCO2 arterial: 43.3 mmHg (ref 32–48)
pCO2 arterial: 52.9 mmHg — ABNORMAL HIGH (ref 32–48)
pCO2 arterial: 46 mmHg (ref 32–48)
pCO2 arterial: 30.5 mmHg — ABNORMAL LOW (ref 32–48)
pCO2 arterial: 35.4 mmHg (ref 32–48)
pCO2 arterial: 36.9 mmHg (ref 32–48)
pCO2 arterial: 37.2 mmHg (ref 32–48)
pCO2 arterial: 36.7 mmHg (ref 32–48)
pH, Arterial: 7.397 (ref 7.35–7.45)
pH, Arterial: 7.349 — ABNORMAL LOW (ref 7.35–7.45)
pH, Arterial: 7.325 — ABNORMAL LOW (ref 7.35–7.45)
pH, Arterial: 7.414 (ref 7.35–7.45)
pH, Arterial: 7.521 — ABNORMAL HIGH (ref 7.35–7.45)
pH, Arterial: 7.373 (ref 7.35–7.45)
pH, Arterial: 7.397 (ref 7.35–7.45)
pH, Arterial: 7.383 (ref 7.35–7.45)
pO2, Arterial: 279 mmHg — ABNORMAL HIGH (ref 83–108)
pO2, Arterial: 389 mmHg — ABNORMAL HIGH (ref 83–108)
pO2, Arterial: 329 mmHg — ABNORMAL HIGH (ref 83–108)
pO2, Arterial: 197 mmHg — ABNORMAL HIGH (ref 83–108)
pO2, Arterial: 385 mmHg — ABNORMAL HIGH (ref 83–108)
pO2, Arterial: 140 mmHg — ABNORMAL HIGH (ref 83–108)
pO2, Arterial: 115 mmHg — ABNORMAL HIGH (ref 83–108)
pO2, Arterial: 103 mmHg (ref 83–108)

## 2021-09-27 LAB — CBC
HCT: 26 % — ABNORMAL LOW (ref 39.0–52.0)
HCT: 21.3 % — ABNORMAL LOW (ref 39.0–52.0)
Hemoglobin: 8.7 g/dL — ABNORMAL LOW (ref 13.0–17.0)
Hemoglobin: 7.3 g/dL — ABNORMAL LOW (ref 13.0–17.0)
MCH: 31.6 pg (ref 26.0–34.0)
MCH: 32 pg (ref 26.0–34.0)
MCHC: 33.5 g/dL (ref 30.0–36.0)
MCHC: 34.3 g/dL (ref 30.0–36.0)
MCV: 94.5 fL (ref 80.0–100.0)
MCV: 93.4 fL (ref 80.0–100.0)
Platelets: 127 10*3/uL — ABNORMAL LOW (ref 150–400)
Platelets: 143 10*3/uL — ABNORMAL LOW (ref 150–400)
RBC: 2.75 MIL/uL — ABNORMAL LOW (ref 4.22–5.81)
RBC: 2.28 MIL/uL — ABNORMAL LOW (ref 4.22–5.81)
RDW: 12.9 % (ref 11.5–15.5)
RDW: 12.9 % (ref 11.5–15.5)
WBC: 11.1 10*3/uL — ABNORMAL HIGH (ref 4.0–10.5)
WBC: 8.8 10*3/uL (ref 4.0–10.5)
nRBC: 0 % (ref 0.0–0.2)
nRBC: 0 % (ref 0.0–0.2)

## 2021-09-27 LAB — GLUCOSE, CAPILLARY
Glucose-Capillary: 121 mg/dL — ABNORMAL HIGH (ref 70–99)
Glucose-Capillary: 144 mg/dL — ABNORMAL HIGH (ref 70–99)
Glucose-Capillary: 133 mg/dL — ABNORMAL HIGH (ref 70–99)
Glucose-Capillary: 122 mg/dL — ABNORMAL HIGH (ref 70–99)
Glucose-Capillary: 123 mg/dL — ABNORMAL HIGH (ref 70–99)
Glucose-Capillary: 137 mg/dL — ABNORMAL HIGH (ref 70–99)
Glucose-Capillary: 135 mg/dL — ABNORMAL HIGH (ref 70–99)
Glucose-Capillary: 132 mg/dL — ABNORMAL HIGH (ref 70–99)
Glucose-Capillary: 160 mg/dL — ABNORMAL HIGH (ref 70–99)

## 2021-09-27 LAB — PREPARE RBC (CROSSMATCH)

## 2021-09-27 LAB — PLATELET COUNT: Platelets: 143 10*3/uL — ABNORMAL LOW (ref 150–400)

## 2021-09-27 LAB — ECHO INTRAOPERATIVE TEE
AV Mean grad: 14 mmHg
Height: 71 in
Weight: 3120 oz

## 2021-09-27 LAB — BASIC METABOLIC PANEL
Anion gap: 7 (ref 5–15)
BUN: 13 mg/dL (ref 6–20)
CO2: 22 mmol/L (ref 22–32)
Calcium: 7.9 mg/dL — ABNORMAL LOW (ref 8.9–10.3)
Chloride: 109 mmol/L (ref 98–111)
Creatinine, Ser: 0.71 mg/dL (ref 0.61–1.24)
GFR, Estimated: >60 mL/min (ref >60)
Glucose, Bld: 135 mg/dL — ABNORMAL HIGH (ref 70–99)
Potassium: 3.9 mmol/L (ref 3.5–5.1)
Sodium: 138 mmol/L (ref 135–145)

## 2021-09-27 LAB — POCT I-STAT EG7
Acid-Base Excess: 1 mmol/L (ref 0.0–2.0)
Bicarbonate: 26.1 mmol/L (ref 20.0–28.0)
Calcium, Ion: 1.05 mmol/L — ABNORMAL LOW (ref 1.15–1.40)
HCT: 25 % — ABNORMAL LOW (ref 39.0–52.0)
Hemoglobin: 8.5 g/dL — ABNORMAL LOW (ref 13.0–17.0)
O2 Saturation: 92 %
Potassium: 4.9 mmol/L (ref 3.5–5.1)
Sodium: 135 mmol/L (ref 135–145)
TCO2: 28 mmol/L (ref 22–32)
pCO2, Ven: 46.1 mmHg (ref 44–60)
pH, Ven: 7.361 (ref 7.25–7.43)
pO2, Ven: 68 mmHg — ABNORMAL HIGH (ref 32–45)

## 2021-09-27 LAB — HEMOGLOBIN AND HEMATOCRIT, BLOOD
HCT: 26.4 % — ABNORMAL LOW (ref 39.0–52.0)
Hemoglobin: 9 g/dL — ABNORMAL LOW (ref 13.0–17.0)

## 2021-09-27 LAB — ABO/RH: ABO/RH(D): O NEG

## 2021-09-27 LAB — MAGNESIUM: Magnesium: 2.3 mg/dL (ref 1.7–2.4)

## 2021-09-27 LAB — FIBRINOGEN: Fibrinogen: 155 mg/dL — ABNORMAL LOW (ref 210–475)

## 2021-09-27 LAB — PROTIME-INR
INR: 1.7 — ABNORMAL HIGH (ref 0.8–1.2)
Prothrombin Time: 19.5 seconds — ABNORMAL HIGH (ref 11.4–15.2)

## 2021-09-27 LAB — APTT: aPTT: 36 seconds (ref 24–36)

## 2021-09-27 SURGERY — REPLACEMENT, AORTIC VALVE, OPEN
Anesthesia: General | Site: Esophagus

## 2021-09-27 MED ORDER — PROPOFOL 10 MG/ML IV BOLUS
INTRAVENOUS | Status: DC | PRN
Start: 2021-09-27 — End: 2021-09-27
  Administered 2021-09-27: 60 mg via INTRAVENOUS
  Administered 2021-09-27 (×3): 50 mg via INTRAVENOUS
  Administered 2021-09-27: 40 mg via INTRAVENOUS

## 2021-09-27 MED ORDER — HEPARIN SODIUM (PORCINE) 1000 UNIT/ML IJ SOLN
INTRAMUSCULAR | Status: DC | PRN
Start: 2021-09-27 — End: 2021-09-27
  Administered 2021-09-27: 24000 [IU] via INTRAVENOUS
  Administered 2021-09-27: 5000 [IU] via INTRAVENOUS

## 2021-09-27 MED ORDER — ROCURONIUM BROMIDE 10 MG/ML (PF) SYRINGE
PREFILLED_SYRINGE | INTRAVENOUS | Status: DC | PRN
  Administered 2021-09-27: 100 mg via INTRAVENOUS
  Administered 2021-09-27: 50 mg via INTRAVENOUS
  Administered 2021-09-27: 20 mg via INTRAVENOUS
  Administered 2021-09-27: 50 mg via INTRAVENOUS

## 2021-09-27 MED ORDER — PROPOFOL 10 MG/ML IV BOLUS
INTRAVENOUS | Status: AC
  Filled 2021-09-27: qty 20

## 2021-09-27 MED ORDER — CEFAZOLIN SODIUM-DEXTROSE 2-4 GM/100ML-% IV SOLN
2.0000 g | Freq: Three times a day (TID) | INTRAVENOUS | Status: AC
  Administered 2021-09-27 – 2021-09-29 (×5): 2 g via INTRAVENOUS
  Filled 2021-09-27 (×3): qty 100

## 2021-09-27 MED ORDER — PHENYLEPHRINE 80 MCG/ML (10ML) SYRINGE FOR IV PUSH (FOR BLOOD PRESSURE SUPPORT)
PREFILLED_SYRINGE | INTRAVENOUS | Status: DC | PRN
  Administered 2021-09-27 (×3): 80 ug via INTRAVENOUS

## 2021-09-27 MED ORDER — DEXMEDETOMIDINE HCL IN NACL 400 MCG/100ML IV SOLN
0.0000 ug/kg/h | INTRAVENOUS | Status: DC
  Administered 2021-09-27: 0.7 ug/kg/h via INTRAVENOUS
  Filled 2021-09-27: qty 100

## 2021-09-27 MED ORDER — CALCIUM GLUCONATE 10 % IV SOLN: 1.0000 g | Freq: Once | INTRAVENOUS | Status: DC

## 2021-09-27 MED ORDER — HEMOSTATIC AGENTS (NO CHARGE) OPTIME
TOPICAL | Status: DC | PRN
  Administered 2021-09-27 (×3): 1 via TOPICAL

## 2021-09-27 MED ORDER — MIDAZOLAM HCL 2 MG/2ML IJ SOLN
INTRAMUSCULAR | Status: AC
  Filled 2021-09-27: qty 2

## 2021-09-27 MED ORDER — POTASSIUM CHLORIDE 10 MEQ/50ML IV SOLN: 10.0000 meq | INTRAVENOUS | Status: AC

## 2021-09-27 MED ORDER — FENTANYL CITRATE (PF) 250 MCG/5ML IJ SOLN
INTRAMUSCULAR | Status: DC | PRN
  Administered 2021-09-27: 100 ug via INTRAVENOUS
  Administered 2021-09-27: 250 ug via INTRAVENOUS
  Administered 2021-09-27: 150 ug via INTRAVENOUS
  Administered 2021-09-27: 200 ug via INTRAVENOUS
  Administered 2021-09-27: 50 ug via INTRAVENOUS
  Administered 2021-09-27: 250 ug via INTRAVENOUS

## 2021-09-27 MED ORDER — MAGNESIUM SULFATE 4 GM/100ML IV SOLN
4.0000 g | Freq: Once | INTRAVENOUS | Status: AC
  Administered 2021-09-27: 4 g via INTRAVENOUS
  Filled 2021-09-27: qty 100

## 2021-09-27 MED ORDER — 0.9 % SODIUM CHLORIDE (POUR BTL) OPTIME
TOPICAL | Status: DC | PRN
  Administered 2021-09-27: 5000 mL

## 2021-09-27 MED ORDER — SODIUM CHLORIDE (PF) 0.9 % IJ SOLN: OROMUCOSAL | Status: DC | PRN

## 2021-09-27 MED ORDER — SODIUM CHLORIDE 0.9% IV SOLUTION: Freq: Once | INTRAVENOUS | Status: DC

## 2021-09-27 MED ORDER — LACTATED RINGERS IV SOLN: INTRAVENOUS | Status: DC | PRN

## 2021-09-27 MED ORDER — ACETAMINOPHEN 160 MG/5ML PO SOLN: 1000.0000 mg | Freq: Four times a day (QID) | ORAL | Status: DC

## 2021-09-27 MED ORDER — HEMOSTATIC AGENTS (NO CHARGE) OPTIME
TOPICAL | Status: DC | PRN
  Administered 2021-09-27: 1 via TOPICAL
  Administered 2021-09-27: 2 via TOPICAL
  Administered 2021-09-27: 1 via TOPICAL

## 2021-09-27 MED ORDER — CALCIUM CHLORIDE 10 % IV SOLN
1.0000 g | Freq: Once | INTRAVENOUS | Status: AC
  Administered 2021-09-27: 1 g via INTRAVENOUS

## 2021-09-27 MED ORDER — ALBUMIN HUMAN 5 % IV SOLN: INTRAVENOUS | Status: DC | PRN

## 2021-09-27 MED ORDER — SODIUM CHLORIDE 0.9% FLUSH
3.0000 mL | Freq: Two times a day (BID) | INTRAVENOUS | Status: DC
  Administered 2021-09-28: 3 mL via INTRAVENOUS

## 2021-09-27 MED ORDER — FENTANYL CITRATE (PF) 250 MCG/5ML IJ SOLN
INTRAMUSCULAR | Status: AC
  Filled 2021-09-27: qty 5

## 2021-09-27 MED ORDER — CHLORHEXIDINE GLUCONATE 0.12 % MT SOLN
15.0000 mL | Freq: Once | OROMUCOSAL | Status: AC
  Administered 2021-09-27: 15 mL via OROMUCOSAL
  Filled 2021-09-27: qty 15

## 2021-09-27 MED ORDER — BISACODYL 10 MG RE SUPP: 10.0000 mg | Freq: Every day | RECTAL | Status: DC

## 2021-09-27 MED ORDER — PROTAMINE SULFATE 10 MG/ML IV SOLN
INTRAVENOUS | Status: AC
  Filled 2021-09-27: qty 25

## 2021-09-27 MED ORDER — METOPROLOL TARTRATE 12.5 MG HALF TABLET: 12.5000 mg | ORAL_TABLET | Freq: Once | ORAL | Status: DC

## 2021-09-27 MED ORDER — ASPIRIN EC 325 MG PO TBEC: 325.0000 mg | DELAYED_RELEASE_TABLET | Freq: Every day | ORAL | Status: DC

## 2021-09-27 MED ORDER — METOPROLOL TARTRATE 5 MG/5ML IV SOLN: 2.5000 mg | INTRAVENOUS | Status: DC | PRN

## 2021-09-27 MED ORDER — ACETAMINOPHEN 650 MG RE SUPP
650.0000 mg | Freq: Once | RECTAL | Status: AC
  Administered 2021-09-27: 650 mg via RECTAL

## 2021-09-27 MED ORDER — METOPROLOL TARTRATE 25 MG/10 ML ORAL SUSPENSION: 12.5000 mg | Freq: Two times a day (BID) | ORAL | Status: DC

## 2021-09-27 MED ORDER — LACTATED RINGERS IV SOLN: INTRAVENOUS | Status: DC

## 2021-09-27 MED ORDER — MIDAZOLAM HCL 2 MG/2ML IJ SOLN
2.0000 mg | INTRAMUSCULAR | Status: DC | PRN
  Filled 2021-09-27: qty 2

## 2021-09-27 MED ORDER — PANTOPRAZOLE SODIUM 40 MG PO TBEC
40.0000 mg | DELAYED_RELEASE_TABLET | Freq: Every day | ORAL | Status: DC
  Administered 2021-09-28 – 2021-10-01 (×4): 40 mg via ORAL
  Filled 2021-09-27 (×5): qty 1

## 2021-09-27 MED ORDER — SODIUM CHLORIDE 0.9 % IV SOLN: INTRAVENOUS | Status: DC

## 2021-09-27 MED ORDER — FAMOTIDINE IN NACL 20-0.9 MG/50ML-% IV SOLN
20.0000 mg | Freq: Two times a day (BID) | INTRAVENOUS | Status: DC
  Administered 2021-09-27: 20 mg via INTRAVENOUS
  Filled 2021-09-27 (×2): qty 50

## 2021-09-27 MED ORDER — SODIUM CHLORIDE 0.9 % IV SOLN: INTRAVENOUS | Status: DC | PRN

## 2021-09-27 MED ORDER — ALBUMIN HUMAN 5 % IV SOLN
250.0000 mL | INTRAVENOUS | Status: AC | PRN
  Administered 2021-09-27: 12.5 g via INTRAVENOUS

## 2021-09-27 MED ORDER — HEPARIN SODIUM (PORCINE) 1000 UNIT/ML IJ SOLN
INTRAMUSCULAR | Status: AC
  Filled 2021-09-27: qty 1

## 2021-09-27 MED ORDER — ~~LOC~~ CARDIAC SURGERY, PATIENT & FAMILY EDUCATION
Freq: Once | Status: DC
  Filled 2021-09-27: qty 1

## 2021-09-27 MED ORDER — MIDAZOLAM HCL (PF) 10 MG/2ML IJ SOLN
INTRAMUSCULAR | Status: AC
  Filled 2021-09-27: qty 2

## 2021-09-27 MED ORDER — CHLORHEXIDINE GLUCONATE 0.12 % MT SOLN: 15.0000 mL | Freq: Once | OROMUCOSAL | Status: DC

## 2021-09-27 MED ORDER — TRAMADOL HCL 50 MG PO TABS
50.0000 mg | ORAL_TABLET | ORAL | Status: DC | PRN
  Administered 2021-09-28: 100 mg via ORAL
  Filled 2021-09-27: qty 2

## 2021-09-27 MED ORDER — INSULIN REGULAR(HUMAN) IN NACL 100-0.9 UT/100ML-% IV SOLN: INTRAVENOUS | Status: DC

## 2021-09-27 MED ORDER — ONDANSETRON HCL 4 MG/2ML IJ SOLN
4.0000 mg | Freq: Four times a day (QID) | INTRAMUSCULAR | Status: DC | PRN
  Administered 2021-09-28 – 2021-09-29 (×2): 4 mg via INTRAVENOUS
  Filled 2021-09-27 (×2): qty 2

## 2021-09-27 MED ORDER — METOPROLOL TARTRATE 12.5 MG HALF TABLET
12.5000 mg | ORAL_TABLET | Freq: Two times a day (BID) | ORAL | Status: DC
  Administered 2021-09-28 (×2): 12.5 mg via ORAL
  Filled 2021-09-27 (×2): qty 1

## 2021-09-27 MED ORDER — LEVOFLOXACIN IN D5W 750 MG/150ML IV SOLN
750.0000 mg | INTRAVENOUS | Status: AC
  Administered 2021-09-28: 750 mg via INTRAVENOUS
  Filled 2021-09-27: qty 150

## 2021-09-27 MED ORDER — ROCURONIUM BROMIDE 10 MG/ML (PF) SYRINGE
PREFILLED_SYRINGE | INTRAVENOUS | Status: AC
  Filled 2021-09-27: qty 30

## 2021-09-27 MED ORDER — SODIUM CHLORIDE 0.45 % IV SOLN: INTRAVENOUS | Status: DC | PRN

## 2021-09-27 MED ORDER — DOCUSATE SODIUM 100 MG PO CAPS
200.0000 mg | ORAL_CAPSULE | Freq: Every day | ORAL | Status: DC
  Administered 2021-09-28 – 2021-10-01 (×4): 200 mg via ORAL
  Filled 2021-09-27 (×5): qty 2

## 2021-09-27 MED ORDER — ACETAMINOPHEN 500 MG PO TABS
1000.0000 mg | ORAL_TABLET | Freq: Four times a day (QID) | ORAL | Status: DC
  Administered 2021-09-27 – 2021-10-02 (×18): 1000 mg via ORAL
  Filled 2021-09-27 (×17): qty 2

## 2021-09-27 MED ORDER — ORAL CARE MOUTH RINSE: 15.0000 mL | Freq: Once | OROMUCOSAL | Status: AC

## 2021-09-27 MED ORDER — OXYCODONE HCL 5 MG PO TABS
5.0000 mg | ORAL_TABLET | ORAL | Status: DC | PRN
  Administered 2021-09-27: 10 mg via ORAL
  Administered 2021-09-28: 5 mg via ORAL
  Administered 2021-09-28 – 2021-09-29 (×4): 10 mg via ORAL
  Administered 2021-09-30: 5 mg via ORAL
  Filled 2021-09-27: qty 1
  Filled 2021-09-27 (×3): qty 2
  Filled 2021-09-27: qty 1
  Filled 2021-09-27 (×2): qty 2

## 2021-09-27 MED ORDER — VANCOMYCIN HCL IN DEXTROSE 1-5 GM/200ML-% IV SOLN
1000.0000 mg | Freq: Once | INTRAVENOUS | Status: AC
  Administered 2021-09-27: 1000 mg via INTRAVENOUS
  Filled 2021-09-27: qty 200

## 2021-09-27 MED ORDER — SODIUM CHLORIDE 0.9 % IV SOLN
250.0000 mL | INTRAVENOUS | Status: DC
  Administered 2021-09-28: 250 mL via INTRAVENOUS

## 2021-09-27 MED ORDER — ASPIRIN 81 MG PO CHEW: 324.0000 mg | CHEWABLE_TABLET | Freq: Every day | ORAL | Status: DC

## 2021-09-27 MED ORDER — LACTATED RINGERS IV SOLN
500.0000 mL | Freq: Once | INTRAVENOUS | Status: AC | PRN
  Administered 2021-09-27: 500 mL via INTRAVENOUS

## 2021-09-27 MED ORDER — MORPHINE SULFATE (PF) 2 MG/ML IV SOLN
1.0000 mg | INTRAVENOUS | Status: DC | PRN
  Administered 2021-09-27 (×2): 2 mg via INTRAVENOUS
  Administered 2021-09-27: 1 mg via INTRAVENOUS
  Administered 2021-09-28 (×2): 2 mg via INTRAVENOUS
  Filled 2021-09-27 (×5): qty 1

## 2021-09-27 MED ORDER — CHLORHEXIDINE GLUCONATE 0.12 % MT SOLN
15.0000 mL | OROMUCOSAL | Status: AC
  Administered 2021-09-27: 15 mL via OROMUCOSAL

## 2021-09-27 MED ORDER — SODIUM CHLORIDE 0.9% FLUSH: 3.0000 mL | INTRAVENOUS | Status: DC | PRN

## 2021-09-27 MED ORDER — BISACODYL 5 MG PO TBEC
10.0000 mg | DELAYED_RELEASE_TABLET | Freq: Every day | ORAL | Status: DC
  Administered 2021-09-28 – 2021-09-30 (×3): 10 mg via ORAL
  Filled 2021-09-27 (×3): qty 2

## 2021-09-27 MED ORDER — METOPROLOL TARTRATE 12.5 MG HALF TABLET
ORAL_TABLET | ORAL | Status: AC
  Filled 2021-09-27: qty 1

## 2021-09-27 MED ORDER — PHENYLEPHRINE HCL-NACL 20-0.9 MG/250ML-% IV SOLN: 0.0000 ug/min | INTRAVENOUS | Status: DC

## 2021-09-27 MED ORDER — CALCIUM CHLORIDE 10 % IV SOLN
INTRAVENOUS | Status: AC
  Filled 2021-09-27: qty 10

## 2021-09-27 MED ORDER — CHLORHEXIDINE GLUCONATE CLOTH 2 % EX PADS
6.0000 | MEDICATED_PAD | Freq: Every day | CUTANEOUS | Status: DC
  Administered 2021-09-27 – 2021-10-01 (×4): 6 via TOPICAL

## 2021-09-27 MED ORDER — PROTAMINE SULFATE 10 MG/ML IV SOLN
INTRAVENOUS | Status: DC | PRN
Start: 2021-09-27 — End: 2021-09-27
  Administered 2021-09-27: 290 mg via INTRAVENOUS

## 2021-09-27 MED ORDER — PROTAMINE SULFATE 10 MG/ML IV SOLN
INTRAVENOUS | Status: AC
  Filled 2021-09-27: qty 5

## 2021-09-27 MED ORDER — DEXTROSE 50 % IV SOLN: 0.0000 mL | INTRAVENOUS | Status: DC | PRN

## 2021-09-27 MED ORDER — MIDAZOLAM HCL (PF) 5 MG/ML IJ SOLN
INTRAMUSCULAR | Status: DC | PRN
  Administered 2021-09-27 (×3): 2 mg via INTRAVENOUS
  Administered 2021-09-27: 6 mg via INTRAVENOUS

## 2021-09-27 MED ORDER — NITROGLYCERIN IN D5W 200-5 MCG/ML-% IV SOLN: 0.0000 ug/min | INTRAVENOUS | Status: DC

## 2021-09-27 MED ORDER — METHYLPREDNISOLONE SODIUM SUCC 125 MG IJ SOLR
INTRAMUSCULAR | Status: DC | PRN
  Administered 2021-09-27: 125 mg via INTRAVENOUS

## 2021-09-27 MED ORDER — PHENYLEPHRINE 80 MCG/ML (10ML) SYRINGE FOR IV PUSH (FOR BLOOD PRESSURE SUPPORT)
PREFILLED_SYRINGE | INTRAVENOUS | Status: AC
  Filled 2021-09-27: qty 20

## 2021-09-27 MED ORDER — CALCIUM CHLORIDE 10 % IV SOLN
INTRAVENOUS | Status: DC | PRN
  Administered 2021-09-27 (×5): 200 mg via INTRAVENOUS

## 2021-09-27 MED ORDER — CHLORHEXIDINE GLUCONATE 4 % EX LIQD: 30.0000 mL | CUTANEOUS | Status: DC

## 2021-09-27 MED ORDER — ACETAMINOPHEN 160 MG/5ML PO SOLN: 650.0000 mg | Freq: Once | ORAL | Status: AC

## 2021-09-27 MED ORDER — ONDANSETRON HCL 4 MG/2ML IJ SOLN
INTRAMUSCULAR | Status: AC
  Filled 2021-09-27: qty 2

## 2021-09-27 SURGICAL SUPPLY — 118 items
ADAPTER CARDIO PERF ANTE/RETRO (ADAPTER) ×3 IMPLANT
APPLICATOR COTTON TIP 6 STRL (MISCELLANEOUS) IMPLANT
APPLICATOR COTTON TIP 6IN STRL (MISCELLANEOUS)
APPLICATOR TIP COSEAL (VASCULAR PRODUCTS) ×2 IMPLANT
BAG DECANTER FOR FLEXI CONT (MISCELLANEOUS) ×3 IMPLANT
BLADE CLIPPER SURG (BLADE) ×3 IMPLANT
BLADE STERNUM SYSTEM 6 (BLADE) ×3 IMPLANT
BLADE SURG 15 STRL LF DISP TIS (BLADE) ×2 IMPLANT
BLADE SURG 15 STRL SS (BLADE) ×1
CANISTER SUCT 3000ML PPV (MISCELLANEOUS) ×3 IMPLANT
CANNULA AORTIC ROOT 9FR (CANNULA) ×1 IMPLANT
CANNULA EZ GLIDE AORTIC 21FR (CANNULA) ×2 IMPLANT
CANNULA GUNDRY RCSP 15FR (MISCELLANEOUS) ×2 IMPLANT
CANNULA MC2 2 STG 36/46 NON-V (CANNULA) IMPLANT
CANNULA SUMP PERICARDIAL (CANNULA) ×1 IMPLANT
CANNULA VENOUS 2 STG 34/46 (CANNULA) ×1
CATH CPB KIT HENDRICKSON (MISCELLANEOUS) ×2 IMPLANT
CATH HEART VENT LEFT (CATHETERS) ×2 IMPLANT
CATH ROBINSON RED A/P 18FR (CATHETERS) ×6 IMPLANT
CATH THORACIC 36FR (CATHETERS) ×1 IMPLANT
CATH THORACIC 36FR RT ANG (CATHETERS) ×6 IMPLANT
CAUTERY SURG HI TEMP FINE TIP (MISCELLANEOUS) ×1 IMPLANT
CLIP FOGARTY SPRING 6M (CLIP) IMPLANT
CNTNR URN SCR LID CUP LEK RST (MISCELLANEOUS) IMPLANT
CONN 3/8X3/8 GISH STERILE (MISCELLANEOUS) ×1 IMPLANT
CONN ST 1/4X3/8  BEN (MISCELLANEOUS) ×2
CONN ST 1/4X3/8 BEN (MISCELLANEOUS) IMPLANT
CONT SPEC 4OZ STRL OR WHT (MISCELLANEOUS) ×2
CONTAINER PROTECT SURGISLUSH (MISCELLANEOUS) ×6 IMPLANT
DRAIN CHANNEL 28F RND 3/8 FF (WOUND CARE) ×1 IMPLANT
DRAPE CV SPLIT W-CLR ANES SCRN (DRAPES) ×1 IMPLANT
DRAPE INCISE IOBAN 66X45 STRL (DRAPES) ×3 IMPLANT
DRAPE PERI GROIN 82X75IN TIB (DRAPES) ×1 IMPLANT
DRAPE WARM FLUID 44X44 (DRAPES) ×3 IMPLANT
DRSG COVADERM 4X14 (GAUZE/BANDAGES/DRESSINGS) ×3 IMPLANT
DRSG COVADERM 4X6 (GAUZE/BANDAGES/DRESSINGS) ×1 IMPLANT
ELECT REM PT RETURN 9FT ADLT (ELECTROSURGICAL) ×6
ELECTRODE REM PT RTRN 9FT ADLT (ELECTROSURGICAL) ×4 IMPLANT
FELT TEFLON 1X6 (MISCELLANEOUS) ×6 IMPLANT
GAUZE 4X4 16PLY ~~LOC~~+RFID DBL (SPONGE) ×3 IMPLANT
GAUZE SPONGE 4X4 12PLY STRL (GAUZE/BANDAGES/DRESSINGS) ×6 IMPLANT
GAUZE SPONGE 4X4 12PLY STRL LF (GAUZE/BANDAGES/DRESSINGS) ×1 IMPLANT
GAUZE SPONGE 4X4 16PLY XRAY LF (GAUZE/BANDAGES/DRESSINGS) ×1 IMPLANT
GLOVE BIO SURGEON STRL SZ 6 (GLOVE) ×3 IMPLANT
GLOVE BIOGEL PI IND STRL 6 (GLOVE) IMPLANT
GLOVE BIOGEL PI IND STRL 9 (GLOVE) IMPLANT
GLOVE BIOGEL PI INDICATOR 6 (GLOVE) ×1
GLOVE BIOGEL PI INDICATOR 9 (GLOVE) ×1
GLOVE SS BIOGEL STRL SZ 6.5 (GLOVE) IMPLANT
GLOVE SUPERSENSE BIOGEL SZ 6.5 (GLOVE) ×1
GLOVE SURG SIGNA 7.5 PF LTX (GLOVE) ×9 IMPLANT
GOWN STRL REUS W/ TWL LRG LVL3 (GOWN DISPOSABLE) ×8 IMPLANT
GOWN STRL REUS W/ TWL XL LVL3 (GOWN DISPOSABLE) ×2 IMPLANT
GOWN STRL REUS W/TWL LRG LVL3 (GOWN DISPOSABLE) ×5
GOWN STRL REUS W/TWL XL LVL3 (GOWN DISPOSABLE) ×1
GRAFT 4 BRANCH 28X50 (Prosthesis & Implant Heart) ×1 IMPLANT
GRAFT CV 30X8WVN NDL (Graft) IMPLANT
GRAFT HEMASHIELD 8MM (Graft) ×1 IMPLANT
HANDLE STAPLE ENDO GIA SHORT (STAPLE) ×1
HEMOSTAT POWDER SURGIFOAM 1G (HEMOSTASIS) ×10 IMPLANT
HEMOSTAT SURGICEL 2X14 (HEMOSTASIS) ×3 IMPLANT
INSERT FOGARTY SM (MISCELLANEOUS) ×2 IMPLANT
INSERT FOGARTY XLG (MISCELLANEOUS) ×1 IMPLANT
KIT BASIN OR (CUSTOM PROCEDURE TRAY) ×3 IMPLANT
KIT SUCTION CATH 14FR (SUCTIONS) ×6 IMPLANT
KIT TURNOVER KIT B (KITS) ×3 IMPLANT
LINE VENT (MISCELLANEOUS) ×1 IMPLANT
NEEDLE AORTIC AIR ASPIRATING (NEEDLE) ×1 IMPLANT
NS IRRIG 1000ML POUR BTL (IV SOLUTION) ×15 IMPLANT
PACK E OPEN HEART (SUTURE) ×3 IMPLANT
PACK OPEN HEART (CUSTOM PROCEDURE TRAY) ×3 IMPLANT
PAD ARMBOARD 7.5X6 YLW CONV (MISCELLANEOUS) ×6 IMPLANT
POSITIONER HEAD DONUT 9IN (MISCELLANEOUS) ×3 IMPLANT
RELOAD EGIA 45 MED/THCK PURPLE (STAPLE) ×1 IMPLANT
SEALANT PATCH FIBRIN 2X4IN (MISCELLANEOUS) ×4 IMPLANT
SEALANT SURG COSEAL 8ML (VASCULAR PRODUCTS) ×2 IMPLANT
SET MPS 3-ND DEL (MISCELLANEOUS) ×1 IMPLANT
SPONGE T-LAP 18X18 ~~LOC~~+RFID (SPONGE) ×12 IMPLANT
SPONGE T-LAP 4X18 ~~LOC~~+RFID (SPONGE) ×4 IMPLANT
STAPLER ENDO GIA 12 SHRT THIN (STAPLE) IMPLANT
STAPLER ENDO GIA 12MM SHORT (STAPLE) ×2 IMPLANT
SUT BONE WAX W31G (SUTURE) ×3 IMPLANT
SUT EB EXC GRN/WHT 2-0 V-5 (SUTURE) ×6 IMPLANT
SUT ETHIBON EXCEL 2-0 V-5 (SUTURE) ×2 IMPLANT
SUT ETHIBOND 2 0 SH (SUTURE) ×1
SUT ETHIBOND 2 0 SH 36X2 (SUTURE) ×2 IMPLANT
SUT ETHIBOND 2 0 V4 (SUTURE) IMPLANT
SUT ETHIBOND 2 0V4 GREEN (SUTURE) IMPLANT
SUT ETHIBOND 4 0 RB 1 (SUTURE) IMPLANT
SUT ETHIBOND V-5 VALVE (SUTURE) IMPLANT
SUT PROLENE 3 0 SH 1 (SUTURE) IMPLANT
SUT PROLENE 3 0 SH DA (SUTURE) ×4 IMPLANT
SUT PROLENE 3 0 SH1 36 (SUTURE) ×1 IMPLANT
SUT PROLENE 4 0 RB 1 (SUTURE) ×15
SUT PROLENE 4 0 SH DA (SUTURE) ×2 IMPLANT
SUT PROLENE 4-0 RB1 .5 CRCL 36 (SUTURE) ×4 IMPLANT
SUT PROLENE 5 0 RB 2 (SUTURE) ×3 IMPLANT
SUT SILK  1 MH (SUTURE) ×1
SUT SILK 1 MH (SUTURE) ×2 IMPLANT
SUT STEEL 6MS V (SUTURE) IMPLANT
SUT STEEL SZ 6 DBL 3X14 BALL (SUTURE) IMPLANT
SUT VIC AB 1 CTX 36 (SUTURE) ×2
SUT VIC AB 1 CTX36XBRD ANBCTR (SUTURE) ×4 IMPLANT
SUT VIC AB 2-0 CTX 27 (SUTURE) IMPLANT
SUT VIC AB 2-0 SH 27 (SUTURE) ×1
SUT VIC AB 2-0 SH 27XBRD (SUTURE) IMPLANT
SUT VIC AB 3-0 X1 27 (SUTURE) IMPLANT
SYR 10ML KIT SKIN ADHESIVE (MISCELLANEOUS) IMPLANT
SYSTEM SAHARA CHEST DRAIN ATS (WOUND CARE) ×3 IMPLANT
TAPE CLOTH SURG 4X10 WHT LF (GAUZE/BANDAGES/DRESSINGS) ×1 IMPLANT
TAPE PAPER 2X10 WHT MICROPORE (GAUZE/BANDAGES/DRESSINGS) ×1 IMPLANT
TOWEL GREEN STERILE (TOWEL DISPOSABLE) ×3 IMPLANT
TOWEL GREEN STERILE FF (TOWEL DISPOSABLE) ×3 IMPLANT
TRAY FOLEY SLVR 16FR TEMP STAT (SET/KITS/TRAYS/PACK) ×3 IMPLANT
UNDERPAD 30X36 HEAVY ABSORB (UNDERPADS AND DIAPERS) ×3 IMPLANT
VALVE ON-X AORTIC 23MM (Prosthesis & Implant Heart) ×1 IMPLANT
VENT LEFT HEART 12002 (CATHETERS) ×3
WATER STERILE IRR 1000ML POUR (IV SOLUTION) ×6 IMPLANT

## 2021-09-27 NOTE — Progress Notes (Signed)
?  Echocardiogram ?Echocardiogram Transesophageal has been performed. ? ?Augustine Radar ?09/27/2021, 8:13 AM ?

## 2021-09-27 NOTE — Anesthesia Procedure Notes (Signed)
Arterial Line Insertion ?Start/End4/19/2023 8:10 AM, 09/27/2021 8:13 AM ?Performed by: Epifanio Lesches, CRNA, CRNA ? Patient location: Pre-op. ?Preanesthetic checklist: patient identified, IV checked, site marked, risks and benefits discussed, surgical consent, monitors and equipment checked, pre-op evaluation, timeout performed and anesthesia consent ?Patient sedated ?Right, radial was placed ?Catheter size: 20 G ?Hand hygiene performed  and maximum sterile barriers used  ?Allen's test indicative of satisfactory collateral circulation ?Attempts: 1 ?Procedure performed without using ultrasound guided technique. ?Following insertion, dressing applied and Biopatch. ?Post procedure assessment: normal ? ?Patient tolerated the procedure well with no immediate complications. ? ? ?

## 2021-09-27 NOTE — Anesthesia Procedure Notes (Signed)
Arterial Line Insertion ?Start/End4/19/2023 8:15 AM, 09/27/2021 8:24 AM ?Performed by: Epifanio Lesches, CRNA, CRNA ? Patient location: Pre-op. ?Preanesthetic checklist: patient identified, IV checked, site marked, risks and benefits discussed, surgical consent, monitors and equipment checked, pre-op evaluation, timeout performed and anesthesia consent ?Lidocaine 1% used for infiltration and patient sedated ?Left, radial was placed ?Catheter size: 20 G ?Hand hygiene performed  and maximum sterile barriers used  ?Allen's test indicative of satisfactory collateral circulation ?Attempts: 3 ?Procedure performed without using ultrasound guided technique. ?Following insertion, Biopatch. ?Post procedure assessment: normal ? ?Post procedure complications: second provider assisted. ?Patient tolerated the procedure well with no immediate complications. ? ? ?

## 2021-09-27 NOTE — Anesthesia Procedure Notes (Signed)
Central Venous Catheter Insertion ?Performed by: Beryle Lathe, MD, anesthesiologist ?Start/End4/19/2023 8:09 AM, 09/27/2021 8:11 AM ?Patient location: Pre-op. ?Preanesthetic checklist: patient identified, IV checked, risks and benefits discussed, surgical consent, monitors and equipment checked, pre-op evaluation, timeout performed and anesthesia consent ?Position: Trendelenburg ?Hand hygiene performed  and maximum sterile barriers used  ?Total catheter length 10. ?PA cath was placed.Swan type:thermodilution ?PA Cath depth:52 ?Procedure performed without using ultrasound guided technique. ?Attempts: 1 ?Patient tolerated the procedure well with no immediate complications. ? ? ? ?

## 2021-09-27 NOTE — Transfer of Care (Signed)
Immediate Anesthesia Transfer of Care Note ? ?Patient: [name] [name] ? ?Procedure(s) Performed: AORTIC VALVE REPLACEMENT (AVR) USING ON-X AORTIC VALVE SIZE (Chest) ?REPLACEMENT ASCENDING AND INNOMINATE ANEURYSM USING HEMASHIELD PLATINUM [id number] (Chest) ?TRANSESOPHAGEAL ECHOCARDIOGRAM (TEE) (Esophagus) ? ?Patient Location: ICU ? ?Anesthesia Type:General ? ?Level of Consciousness: Patient remains intubated per anesthesia plan ? ?Airway & Oxygen Therapy: Patient remains intubated per anesthesia plan and Patient placed on Ventilator (see vital sign flow sheet for setting) ? ?Post-op Assessment: Report given to RN and Post -op Vital signs reviewed and stable ? ?Post vital signs: Reviewed and stable ? ?Last Vitals:  ?Vitals Value Taken Time  ?BP 114/83 09/27/21 1500  ?Temp 34.4 ?C 09/27/21 1504  ?Pulse 89 09/27/21 1504  ?Resp 0 09/27/21 1504  ?SpO2 100 % 09/27/21 1504  ?Vitals shown include unvalidated device data. ? ?Last Pain:  ?Vitals:  ? 09/27/21 0716  ?TempSrc:   ?PainSc: 0-No pain  ?   ? ?  ? ?Complications: No notable events documented. ?

## 2021-09-27 NOTE — Procedures (Signed)
Extubation Procedure Note ? ?Patient Details:   ?Name: [name] [name] ?DOB: 03/28/1991 ?MRN: [id number] ?  ?Airway Documentation:  ?  ?RT extubated patient to 4L [place]. Cuff leak present. VC and NIF done with patient with good effort. VC 1.1L and NIF -30. Patient tolerated well. Patient able to speak after extubation. No stridor or distress noted at this time. ? ?Evaluation ? O2 sats: stable throughout ?Complications: No apparent complications ?Patient did tolerate procedure well. ?Bilateral Breath Sounds: Clear ?  ?Yes ? ?Kelle Darting ?09/27/2021, 2040 ? ?

## 2021-09-27 NOTE — Progress Notes (Signed)
Patient ID: [name] [name] Natal, male   DOB: [birth year], 31 y.o.   MRN: [id number] ? ?TCTS Evening Rounds:  ? ?Hemodynamically stable  ?CI = 2.4 ? ?Has started to wake up on vent. Still cold ? ?Urine output good  ?CT output low ? ?CBC ?   ?Component Value Date/Time  ? WBC 11.1 (H) 09/27/2021 1510  ? RBC 2.75 (L) 09/27/2021 1510  ? HGB 8.7 (L) 09/27/2021 1510  ? HGB 14.6 08/30/2021 1430  ? HCT 26.0 (L) 09/27/2021 1510  ? HCT 41.3 08/30/2021 1430  ? PLT 127 (L) 09/27/2021 1510  ? PLT 277 08/30/2021 1430  ? MCV 94.5 09/27/2021 1510  ? MCV 90 08/30/2021 1430  ? MCH 31.6 09/27/2021 1510  ? MCHC 33.5 09/27/2021 1510  ? RDW 12.9 09/27/2021 1510  ? RDW 12.8 08/30/2021 1430  ? LYMPHSABS 1.4 08/02/2021 0830  ? MONOABS 0.5 08/02/2021 0830  ? EOSABS 0.3 08/02/2021 0830  ? BASOSABS 0.0 08/02/2021 0830  ? ? ? ?BMET ?   ?Component Value Date/Time  ? NA 141 09/27/2021 1327  ? NA 142 08/30/2021 1430  ? K 3.9 09/27/2021 1327  ? CL 103 09/27/2021 1323  ? CO2 24 09/25/2021 1306  ? GLUCOSE 132 (H) 09/27/2021 1323  ? BUN 11 09/27/2021 1323  ? BUN 13 08/30/2021 1430  ? CREATININE 0.50 (L) 09/27/2021 1323  ? CALCIUM 9.3 09/25/2021 1306  ? EGFR 97 08/30/2021 1430  ? GFRNONAA >60 09/25/2021 1306  ? ? ? ?A/P:  Stable postop course. Continue current plans. Wean vent when warmed up. ? ?

## 2021-09-27 NOTE — Brief Op Note (Signed)
09/27/2021 ? ?12:46 PM ? ?PATIENT:  [name] [name]  31 y.o. male ? ?PRE-OPERATIVE DIAGNOSIS:  Aortic Stenosis ?Thoracic Aortic Aneurysm ? ?POST-OPERATIVE DIAGNOSIS:  Aortic Stenosis ?Thoracic Aortic Aneurysm ? ?PROCEDURE:  Procedure(s): ?AORTIC VALVE REPLACEMENT (AVR) USING ON-X AORTIC VALVE SIZE 23MM (N/A) ?REPLACEMENT ASCENDING AND INNOMINATE ANEURYSM USING HEMASHIELD PLATINUM [id number] (N/A) ?TRANSESOPHAGEAL ECHOCARDIOGRAM (TEE) (N/A) ? ?SURGEON:  Surgeon(s) and Role: ?   * [name], MD - Primary ? ?PHYSICIAN ASSISTANT: Yee Joss [name] ? ?ASSISTANTS: STAFF  ? ?ANESTHESIA:   general ? ?EBL:  1655 mL  ? ?BLOOD ADMINISTERED:none ? ?DRAINS:  MEDIASTINAL CHEST DRAIN   ? ?LOCAL MEDICATIONS USED:  NONE ? ?SPECIMEN:  Source of Specimen:  AORTIC VALVE, AORTIC ANEURYSM, INNOMINATE ANEURYSM ? ?DISPOSITION OF SPECIMEN:  PATHOLOGY ? ?COUNTS:  YES ? ?TOURNIQUET:  * No tourniquets in log * ? ?DICTATION: .Other Dictation: Dictation Number PENDING ? ?PLAN OF CARE: Admit to inpatient  ? ?PATIENT DISPOSITION:  ICU - intubated and hemodynamically stable. ?  ?Delay start of Pharmacological VTE agent (>24hrs) due to surgical blood loss or risk of bleeding: yes ? ?COMPLICATIONS: NO KNOWN ? ?

## 2021-09-27 NOTE — Anesthesia Procedure Notes (Signed)
Procedure Name: Intubation ?Date/Time: 09/27/2021 8:55 AM ?Performed by: Inda Coke, CRNA ?Pre-anesthesia Checklist: Patient identified, Emergency Drugs available, Suction available and Patient being monitored ?Patient Re-evaluated:Patient Re-evaluated prior to induction ?Oxygen Delivery Method: Circle System Utilized ?Preoxygenation: Pre-oxygenation with 100% oxygen ?Induction Type: IV induction ?Ventilation: Mask ventilation without difficulty ?Laryngoscope Size: Mac and 4 ?Grade View: Grade I ?Tube type: Oral ?Tube size: 8.0 mm ?Number of attempts: 1 ?Airway Equipment and Method: Stylet and Oral airway ?Placement Confirmation: ETT inserted through vocal cords under direct vision, positive ETCO2 and breath sounds checked- equal and bilateral ?Secured at: 23 cm ?Tube secured with: Tape ?Dental Injury: Teeth and Oropharynx as per pre-operative assessment  ? ? ? ? ?

## 2021-09-27 NOTE — Anesthesia Procedure Notes (Signed)
Central Venous Catheter Insertion ?Performed by: Audry Pili, MD, anesthesiologist ?Start/End4/19/2023 7:55 AM, 09/27/2021 8:11 AM ?Patient location: Pre-op. ?Preanesthetic checklist: patient identified, IV checked, risks and benefits discussed, surgical consent, monitors and equipment checked, pre-op evaluation, timeout performed and anesthesia consent ?Position: Trendelenburg ?Lidocaine 1% used for infiltration and patient sedated ?Hand hygiene performed , maximum sterile barriers used  and Seldinger technique used ?Catheter size: 8.5 Fr ?Central line was placed.MAC introducer ?Procedure performed using ultrasound guided technique. ?Ultrasound Notes:anatomy identified, needle tip was noted to be adjacent to the nerve/plexus identified, no ultrasound evidence of intravascular and/or intraneural injection and image(s) printed for medical record ?Attempts: 1 ?Following insertion, line sutured, dressing applied and Biopatch. ?Post procedure assessment: blood return through all ports, no air and free fluid flow ? ?Patient tolerated the procedure well with no immediate complications. ? ? ? ? ?

## 2021-09-27 NOTE — Interval H&P Note (Signed)
History and Physical Interval Note: ? ?09/27/2021 ?7:22 AM ? 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  The various methods of treatment have been discussed with the patient and family. After consideration of risks, benefits and other options for treatment, the patient has consented to  Procedure(s): ?AORTIC VALVE REPLACEMENT (AVR) (N/A) ?REPLACEMENT ASCENDING/INNOMINATE ANEURYSM (N/A) ?TRANSESOPHAGEAL ECHOCARDIOGRAM (TEE) (N/A) as a surgical intervention.  The patient's history has been reviewed, patient examined, no change in status, stable for surgery.  I have reviewed the patient's chart and labs.  Questions were answered to the patient's satisfaction.   ? ? ?[name] ? ? ?

## 2021-09-27 NOTE — Anesthesia Postprocedure Evaluation (Signed)
Anesthesia Post Note ? ?Patient: Decklan Mau Rexford Brecheen ? ?Procedure(s) Performed: AORTIC VALVE REPLACEMENT (AVR) USING ON-X AORTIC VALVE SIZE (Chest) ?REPLACEMENT ASCENDING AND INNOMINATE ANEURYSM USING HEMASHIELD PLATINUM [id number] (Chest) ?TRANSESOPHAGEAL ECHOCARDIOGRAM (TEE) (Esophagus) ? ?  ? ?Patient location during evaluation: ICU ?Anesthesia Type: General ?Level of consciousness: sedated and patient remains intubated per anesthesia plan ?Pain management: pain level controlled ?Vital Signs Assessment: post-procedure vital signs reviewed and stable ?Respiratory status: patient remains intubated per anesthesia plan ?Cardiovascular status: stable ?Postop Assessment: no apparent nausea or vomiting ?Anesthetic complications: no ? ? ?No notable events documented. ? ?Last Vitals:  ?Vitals:  ? 09/27/21 1621 09/27/21 1631  ?BP:    ?Pulse: 89 89  ?Resp: (!) 0 11  ?Temp: (!) 34.6 ?C (!) 34.6 ?C  ?SpO2: 100% 99%  ?  ?Last Pain:  ?Vitals:  ? 09/27/21 1507  ?TempSrc: Core  ?PainSc:   ? ? ?  ?  ?  ?  ?  ?  ? ?Beryle Lathe ? ? ? ? ?

## 2021-09-28 ENCOUNTER — Encounter (HOSPITAL_COMMUNITY): Payer: Self-pay | Admitting: Thoracic Surgery (Cardiothoracic Vascular Surgery)

## 2021-09-28 ENCOUNTER — Inpatient Hospital Stay (HOSPITAL_COMMUNITY): Payer: Commercial Managed Care - PPO

## 2021-09-28 LAB — GLUCOSE, CAPILLARY
Glucose-Capillary: 116 mg/dL — ABNORMAL HIGH (ref 70–99)
Glucose-Capillary: 119 mg/dL — ABNORMAL HIGH (ref 70–99)
Glucose-Capillary: 122 mg/dL — ABNORMAL HIGH (ref 70–99)
Glucose-Capillary: 126 mg/dL — ABNORMAL HIGH (ref 70–99)
Glucose-Capillary: 127 mg/dL — ABNORMAL HIGH (ref 70–99)
Glucose-Capillary: 128 mg/dL — ABNORMAL HIGH (ref 70–99)
Glucose-Capillary: 135 mg/dL — ABNORMAL HIGH (ref 70–99)
Glucose-Capillary: 138 mg/dL — ABNORMAL HIGH (ref 70–99)
Glucose-Capillary: 163 mg/dL — ABNORMAL HIGH (ref 70–99)

## 2021-09-28 LAB — BASIC METABOLIC PANEL
Anion gap: 7 (ref 5–15)
Anion gap: 9 (ref 5–15)
BUN: 11 mg/dL (ref 6–20)
BUN: 11 mg/dL (ref 6–20)
CO2: 20 mmol/L — ABNORMAL LOW (ref 22–32)
CO2: 24 mmol/L (ref 22–32)
Calcium: 7.8 mg/dL — ABNORMAL LOW (ref 8.9–10.3)
Calcium: 8.2 mg/dL — ABNORMAL LOW (ref 8.9–10.3)
Chloride: 107 mmol/L (ref 98–111)
Chloride: 102 mmol/L (ref 98–111)
Creatinine, Ser: 0.67 mg/dL (ref 0.61–1.24)
Creatinine, Ser: 0.92 mg/dL (ref 0.61–1.24)
GFR, Estimated: >60 mL/min (ref >60)
GFR, Estimated: >60 mL/min (ref >60)
Glucose, Bld: 121 mg/dL — ABNORMAL HIGH (ref 70–99)
Glucose, Bld: 210 mg/dL — ABNORMAL HIGH (ref 70–99)
Potassium: 3.7 mmol/L (ref 3.5–5.1)
Potassium: 4.1 mmol/L (ref 3.5–5.1)
Sodium: 134 mmol/L — ABNORMAL LOW (ref 135–145)
Sodium: 135 mmol/L (ref 135–145)

## 2021-09-28 LAB — PREPARE FRESH FROZEN PLASMA: Unit division: 0

## 2021-09-28 LAB — BPAM FFP
Blood Product Expiration Date: 202304242359
Blood Product Expiration Date: 202304242359
ISSUE DATE / TIME: 202304191408
ISSUE DATE / TIME: 202304191408
Unit Type and Rh: 7300
Unit Type and Rh: 7300

## 2021-09-28 LAB — CBC
HCT: 19.4 % — ABNORMAL LOW (ref 39.0–52.0)
HCT: 26.7 % — ABNORMAL LOW (ref 39.0–52.0)
Hemoglobin: 6.7 g/dL — CL (ref 13.0–17.0)
Hemoglobin: 8.9 g/dL — ABNORMAL LOW (ref 13.0–17.0)
MCH: 31.8 pg (ref 26.0–34.0)
MCH: 31.3 pg (ref 26.0–34.0)
MCHC: 34.5 g/dL (ref 30.0–36.0)
MCHC: 33.3 g/dL (ref 30.0–36.0)
MCV: 91.9 fL (ref 80.0–100.0)
MCV: 94 fL (ref 80.0–100.0)
Platelets: 114 10*3/uL — ABNORMAL LOW (ref 150–400)
Platelets: 165 10*3/uL (ref 150–400)
RBC: 2.11 MIL/uL — ABNORMAL LOW (ref 4.22–5.81)
RBC: 2.84 MIL/uL — ABNORMAL LOW (ref 4.22–5.81)
RDW: 12.9 % (ref 11.5–15.5)
RDW: 14 % (ref 11.5–15.5)
WBC: 8.3 10*3/uL (ref 4.0–10.5)
WBC: 13.7 10*3/uL — ABNORMAL HIGH (ref 4.0–10.5)
nRBC: 0 % (ref 0.0–0.2)
nRBC: 0 % (ref 0.0–0.2)

## 2021-09-28 LAB — PREPARE CRYOPRECIPITATE: Unit division: 0

## 2021-09-28 LAB — BPAM PLATELET PHERESIS
Blood Product Expiration Date: 202304192359
Blood Product Expiration Date: 202304212359
ISSUE DATE / TIME: 202304191407
ISSUE DATE / TIME: 202304191407
Unit Type and Rh: 5100
Unit Type and Rh: 6200

## 2021-09-28 LAB — BPAM CRYOPRECIPITATE
Blood Product Expiration Date: 202304192010
ISSUE DATE / TIME: 202304191424
Unit Type and Rh: 5100

## 2021-09-28 LAB — PREPARE PLATELET PHERESIS
Unit division: 0
Unit division: 0

## 2021-09-28 LAB — MAGNESIUM
Magnesium: 1.9 mg/dL (ref 1.7–2.4)
Magnesium: 2.1 mg/dL (ref 1.7–2.4)

## 2021-09-28 LAB — SURGICAL PATHOLOGY

## 2021-09-28 LAB — PREPARE RBC (CROSSMATCH)

## 2021-09-28 MED ORDER — ASPIRIN EC 81 MG PO TBEC
81.0000 mg | DELAYED_RELEASE_TABLET | Freq: Every day | ORAL | Status: DC
  Administered 2021-09-28 – 2021-10-02 (×5): 81 mg via ORAL
  Filled 2021-09-28 (×5): qty 1

## 2021-09-28 MED ORDER — WARFARIN - PHYSICIAN DOSING INPATIENT: Freq: Every day | Status: DC

## 2021-09-28 MED ORDER — FUROSEMIDE 10 MG/ML IJ SOLN
20.0000 mg | Freq: Once | INTRAMUSCULAR | Status: AC
  Administered 2021-09-28: 20 mg via INTRAVENOUS
  Filled 2021-09-28: qty 2

## 2021-09-28 MED ORDER — ENOXAPARIN SODIUM 40 MG/0.4ML IJ SOSY
40.0000 mg | PREFILLED_SYRINGE | Freq: Every day | INTRAMUSCULAR | Status: DC
  Administered 2021-09-28 – 2021-10-01 (×4): 40 mg via SUBCUTANEOUS
  Filled 2021-09-28 (×4): qty 0.4

## 2021-09-28 MED ORDER — INSULIN ASPART 100 UNIT/ML IJ SOLN
0.0000 [IU] | INTRAMUSCULAR | Status: DC
  Administered 2021-09-28: 2 [IU] via SUBCUTANEOUS

## 2021-09-28 MED ORDER — WARFARIN SODIUM 2.5 MG PO TABS
2.5000 mg | ORAL_TABLET | Freq: Every day | ORAL | Status: DC
  Administered 2021-09-28: 2.5 mg via ORAL
  Filled 2021-09-28: qty 1

## 2021-09-28 MED ORDER — KETOROLAC TROMETHAMINE 15 MG/ML IJ SOLN
15.0000 mg | Freq: Four times a day (QID) | INTRAMUSCULAR | Status: AC
  Administered 2021-09-28 – 2021-09-30 (×8): 15 mg via INTRAVENOUS
  Filled 2021-09-28 (×6): qty 1

## 2021-09-28 MED ORDER — SODIUM CHLORIDE 0.9% IV SOLUTION: Freq: Once | INTRAVENOUS | Status: DC

## 2021-09-28 MED ORDER — INSULIN DETEMIR 100 UNIT/ML ~~LOC~~ SOLN
12.0000 [IU] | Freq: Every day | SUBCUTANEOUS | Status: AC
  Administered 2021-09-28: 12 [IU] via SUBCUTANEOUS
  Filled 2021-09-28: qty 0.12

## 2021-09-28 MED ORDER — POTASSIUM CHLORIDE 10 MEQ/50ML IV SOLN
10.0000 meq | INTRAVENOUS | Status: AC
  Administered 2021-09-28 (×3): 10 meq via INTRAVENOUS
  Filled 2021-09-28 (×3): qty 50

## 2021-09-28 NOTE — Progress Notes (Signed)
1 Day Post-Op Procedure(s) (LRB): ?AORTIC VALVE REPLACEMENT (AVR) USING ON-X AORTIC VALVE SIZE (N/A) ?REPLACEMENT ASCENDING AND INNOMINATE ANEURYSM USING HEMASHIELD PLATINUM [id number] (N/A) ?TRANSESOPHAGEAL ECHOCARDIOGRAM (TEE) (N/A) ?Subjective: ?Some incisional pain ? ?Objective: ?Vital signs in last 24 hours: ?Temp:  [93.7 ?F (34.3 ?C)-100.2 ?F (37.9 ?C)] 100.2 ?F (37.9 ?C) (04/20 1914) ?Pulse Rate:  [75-90] 78 (04/20 0742) ?Cardiac Rhythm: Normal sinus rhythm (04/20 0730) ?Resp:  [0-26] 16 (04/20 0742) ?BP: (98-122)/(65-89) 116/70 (04/20 0700) ?SpO2:  [95 %-100 %] 98 % (04/20 0742) ?Arterial Line BP: (97-147)/(55-82) 135/61 (04/20 0742) ?FiO2 (%):  [40 %-50 %] 40 % (04/19 2015) ?Weight:  [95.5 kg] 95.5 kg (04/20 0600) ? ?Hemodynamic parameters for last 24 hours: ?PAP: (11-29)/(4-15) 23/11 ?CO:  [2.9 L/min-6.6 L/min] 6.6 L/min ?CI:  [1.4 L/min/m2-3.2 L/min/m2] 3.2 L/min/m2 ? ?Intake/Output from previous day: ?04/19 0701 - 04/20 0700 ?In: 7157 [I.V.:4189.5; Blood:1792; NG/GT:30; IV Piggyback:1145.5] ?Out: 5435 [Urine:3400; Blood:1655; Chest Tube:380] ?Intake/Output this shift: ?Total I/O ?In: 450 [Blood:450] ?Out: 130 [Urine:130] ? ?General appearance: alert, cooperative, and no distress ?Neurologic: intact ?Heart: regular rate and rhythm ?Lungs: diminished breath sounds bibasilar ?Abdomen: normal findings: soft, non-tender ? ?Lab Results: ?Recent Labs  ?  09/27/21 ?2151 09/27/21 ?2158 09/28/21 ?0418  ?WBC 8.8  --  8.3  ?HGB 7.3* 6.5* 6.7*  ?HCT 21.3* 19.0* 19.4*  ?PLT 143*  --  114*  ? ?BMET:  ?Recent Labs  ?  09/27/21 ?2151 09/27/21 ?2158 09/28/21 ?0418  ?NA 138 140 134*  ?K 3.9 4.0 3.7  ?CL 109  --  107  ?CO2 22  --  20*  ?GLUCOSE 135*  --  121*  ?BUN 13  --  11  ?CREATININE 0.71  --  0.67  ?CALCIUM 7.9*  --  7.8*  ?  ?PT/INR:  ?Recent Labs  ?  09/27/21 ?1510  ?LABPROT 19.5*  ?INR 1.7*  ? ?ABG ?   ?Component Value Date/Time  ? PHART 7.383 09/27/2021 2158  ? HCO3 21.9 09/27/2021 2158  ? TCO2 23  09/27/2021 2158  ? ACIDBASEDEF 3.0 (H) 09/27/2021 2158  ? O2SAT 98 09/27/2021 2158  ? ?CBG (last 3)  ?Recent Labs  ?  09/28/21 ?0214 09/28/21 ?0424 09/28/21 ?0628  ?GLUCAP 126* 138* 127*  ? ? ?Assessment/Plan: ?S/P Procedure(s) (LRB): ?AORTIC VALVE REPLACEMENT (AVR) USING ON-X AORTIC VALVE SIZE (N/A) ?REPLACEMENT ASCENDING AND INNOMINATE ANEURYSM USING HEMASHIELD PLATINUM [id number] (N/A) ?TRANSESOPHAGEAL ECHOCARDIOGRAM (TEE) (N/A) ?POD # 1 looks great ?NEURO- intact ?CV- in SR with good hemodynamics ? Start warfarin ?RESP- IS ?RENAL creatinine normal ? Diurese, supplement K ?ENDO- CBG well controlled ? Transition off insulin drip ?Anemia secondary to ABL- transfused this AM ?Thrombocytopenia - mild, follow ?Dc chest tubes ?Mobilize ? ? LOS: 1 day  ? ? ?Loreli Slot ?09/28/2021 ? ? ?

## 2021-09-28 NOTE — Discharge Summary (Addendum)
? ?   ?[name] [name].[address] ?      [place] [address] ?            [id number]   ? ?Physician Discharge Summary  ?Patient ID: ?[name] [name] ?MRN: [id number] ?DOB/AGE: September 13, 1990 31 y.o. ? ?Admit date: 09/27/2021 ?Discharge date: 10/02/2021 ? 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 ?Patient Active Problem List  ? Diagnosis Date Noted  ? Unilateral inguinal hernia without obstruction or gangrene 07/28/2021  ? Transaminitis 06/08/2020  ? Hypokalemia 06/08/2020  ? Aortic valve calcification 06/08/2020  ? Acute pancreatitis 06/06/2020  ? Mild aortic stenosis 11/01/2016  ? Bicuspid aortic valve 11/01/2016  ? 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 Diagnosis Date Noted  ? S/P AVR (aortic valve replacement) and aortoplasty 09/27/2021  ? Unilateral inguinal hernia without obstruction or gangrene 07/28/2021  ? Transaminitis 06/08/2020  ? Hypokalemia 06/08/2020  ? Aortic valve calcification 06/08/2020  ? Acute pancreatitis 06/06/2020  ? Mild aortic stenosis 11/01/2016  ? Bicuspid aortic valve 11/01/2016  ? ?Discharged Condition: good ? ?HPI: [name] was sent for consultation regarding abnormal bicuspid aortic valve and ascending aneurysm. ? ?[name] [name] "[name] [name]" [name] [name] is a 31 year old man with a longstanding heart murmur and a known bicuspid aortic valve.  Past medical history is also significant for pancreatitis, asthma, pneumonia and migraines.  He recently was being evaluated for a right inguinal hernia repair.  He was sent for medical clearance due to his heart murmur.  He was referred to Dr. Ali Lowe.  An echocardiogram showed a bicuspid aortic valve with moderate AS and severe AI.  There also was a dilated ascending aorta.  It was estimated at 5.2 cm.  A CT angio of the chest showed the aneurysm actually measures about 4.5 to 4.6 cm.   He had cardiac catheterization which revealed normal coronary arteries.  Right heart cath showed normal filling pressures and preserved cardiac index. ? ?He is not having any chest pain.  He does get short of breath with heavy exertion but nothing out of the ordinary.  He does complain of fatigue. ? ?The patient and his studies were reviewed by Dr. Roxan Hockey who recommended proceeding with elective aortic valve replacement with repair of a sending aortic and innominate artery aneurysms. ? ?Hospital course the patient was admitted electively and on 09/27/2021 he was taken to the operating room at which time he underwent aortic valve replacement with a 23 mm On-X mechanical valve as well as repair of a sending aortic and innominate artery aneurysms under moderate hypothermic circulatory arrest.  He tolerated the procedure well was taken to the surgical intensive care unit in stable condition. ? ?Postoperative hospital course: ? ?The patient has remained hemodynamically stable and neurologically intact.  His chest tubes were removed on postoperative day #1.  He is noted to have an expected acute blood loss anemia and has required transfusion.  He has been started on warfarin for his mechanical aortic valve.  Daily INR's are obtained.  He does have some expected postoperative volume overload and is being diuresed.  Creatinine has remained within normal limits.  He has progressed well in regards to his routine cardiac rehab.  Most recent INR is 2.5 mg and plan is for discharge on 3 mg of Coumadin with Coumadin clinic appointment in a couple days.  The patient is maintaining NSR.  He is  ambulating independently.  His surgical incisions are healing without evidence of infection.  He is medically stable for discharge home today. ? ?Consults: None ? ?Significant Diagnostic Studies: cardiac graphics:  ? ?Echocardiogram:   ? ?IMPRESSIONS  ? ? 1. Left ventricular ejection fraction, by estimation, is 60 to 65%. The  ?left  ventricle has normal function. The left ventricle has no regional  ?wall motion abnormalities. There is mild left ventricular hypertrophy.  ?Left ventricular diastolic parameters  ?were normal.  ? 2. Right ventricular systolic function is normal. The right ventricular  ?size is normal. There is normal pulmonary artery systolic pressure. The  ?estimated right ventricular systolic pressure is 123XX123 mmHg.  ? 3. Left atrial size was mildly dilated.  ? 4. The mitral valve is normal in structure. Trivial mitral valve  ?regurgitation.  ? 5. The inferior vena cava is normal in size with greater than 50%  ?respiratory variability, suggesting right atrial pressure of 3 mmHg.  ? 6. The aortic valve was not well visualized. Aortic valve regurgitation  ?is moderate to severe. Moderate aortic valve stenosis. Vmax 3.2 m/s, MG 23  ?mmHg, AVA 1.5 cm^2, DI 0.27. Eccentric AI jet appears moderate visually,  ?but likely underestimating due to  ? eccentric jet. There does appear to be holodiastolic flow reversal in  ?aorta, argues for severe AI  ? 7. Aortic dilatation noted. 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  ? ?Treatments: surgery:  ?Operative Report  ?  ?DATE OF PROCEDURE: 09/27/2021 ?  ?PREOPERATIVE DIAGNOSES:  Bicuspid aortic valve with moderate stenosis and severe insufficiency and ascending and proximal innominate artery aneurysm. ?  ?POSTOPERATIVE DIAGNOSES:  Bicuspid aortic valve with moderate stenosis and severe insufficiency and ascending and proximal innominate artery aneurysm. ?  ?PROCEDURE:  Median sternotomy, extracorporeal circulation, aortic valve replacement with 23 mm On-X mechanical valve (serial number [id number]) and repair of ascending aortic and innominate artery aneurysm under moderate hypothermic circulatory arrest. ?  ?SURGEON:  Revonda Standard. Roxan Hockey, MD ?  ?ASSISTANT:  Jadene Pierini. ? ?Discharge Exam: ?Blood pressure 116/88, pulse 85, temperature 98.3 ?F (36.8 ?C), temperature source Oral, resp.  rate 16, height 5\' 11"  (1.803 m), weight 88.7 kg, SpO2 98 %. ? ?General appearance: alert, cooperative, and no distress ?Heart: regular rate and rhythm ?Lungs: clear to auscultation bilaterally ?Abdomen: soft, non-tender; bowel sounds normal; no masses,  no organomegaly ?Extremities: extremities normal, atraumatic, no cyanosis or edema ?Wound: clean and dry ? ?Discharge Medications: ? ?The patient has been discharged on: ? ? ?1.Beta Blocker:  Yes [ X  ] ?                             No   [   ] ?                             If No, reason: ? ?2.Ace Inhibitor/ARB: Yes [   ] ?                                    No  [  X  ] ?                                    If No, reason: labile  BP ? ?3.Statin:   Yes [   ] ?                 No  [  x ] ?                 If No, reason: No CAD ? ?4.Ecasa:  Yes  [  X ] ?                 No   [   ] ?                 If No, reason: ? ?Patient had ACS upon admission: ? ?Plavix/P2Y12 inhibitor: Yes [   ] ?                                     No  Valu.Nieves   ] ? ? ?Allergies as of 10/02/2021   ? ?   Reactions  ? Amoxicillin Hives  ? ?  ? ?  ?Medication List  ?  ? ?STOP taking these medications   ? ?metoprolol tartrate 25 MG tablet ?Commonly known as: LOPRESSOR ?  ? ?  ? ?TAKE these medications   ? ?acetaminophen 500 MG tablet ?Commonly known as: TYLENOL ?Take 1-2 tablets (500-1,000 mg total) by mouth every 6 (six) hours as needed. ?  ?albuterol 108 (90 Base) MCG/ACT inhaler ?Commonly known as: VENTOLIN HFA ?Inhale 2 puffs into the lungs every 6 (six) hours as needed for wheezing or shortness of breath. ?  ?aspirin 81 MG EC tablet ?Take 1 tablet (81 mg total) by mouth daily. Swallow whole. ?  ?ferrous sulfate 325 (65 FE) MG EC tablet ?Take 1 tablet (325 mg total) by mouth 2 (two) times daily. ?  ?loratadine 10 MG tablet ?Commonly known as: CLARITIN ?Take 10 mg by mouth daily as needed for allergies. ?  ?metoprolol succinate 25 MG 24 hr tablet ?Commonly known as: TOPROL-XL ?Take 1 tablet (25 mg total)  by mouth daily. ?  ?oxyCODONE 5 MG immediate release tablet ?Commonly known as: Oxy IR/ROXICODONE ?Take 1 tablet (5 mg total) by mouth every 4 (four) hours as needed for severe pain. ?  ?warfarin 3 MG tablet

## 2021-09-28 NOTE — Progress Notes (Signed)
Date and time results received: 09/28/21 0530 ?Test: hgb/hct ?Critical Value: 6.7/19.4 ? ?Name of Provider Notified: DR Cyndia Bent ? ?Orders Received ?

## 2021-09-28 NOTE — Plan of Care (Signed)

## 2021-09-28 NOTE — Plan of Care (Signed)
?  Problem: Respiratory: ?Goal: Respiratory status will improve ?Outcome: Progressing ?  ?Problem: Cardiac: ?Goal: Will achieve and/or maintain hemodynamic stability ?Outcome: Progressing ?  ?Problem: Urinary Elimination: ?Goal: Ability to achieve and maintain adequate renal perfusion and functioning will improve ?Outcome: Progressing ?  ?Problem: Activity: ?Goal: Risk for activity intolerance will decrease ?Outcome: Progressing ?  ?Problem: Education: ?Goal: Knowledge of disease or condition will improve ?Outcome: Progressing ?  ?

## 2021-09-28 NOTE — Op Note (Signed)
NAME: [name] [name], [name] [name] MEDICAL RECORD NO: [id number] ACCOUNT NO: [id number] DATE OF BIRTH: [birth year] FACILITY: [facility] LOCATION: [facility] PHYSICIAN: Salvatore Decent. 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  PROCEDURE:   Median sternotomy, extracorporeal circulation,  Aortic valve replacement with 23 mm On-X mechanical valve (serial number [id number]) and  Repair of ascending aortic and innominate artery aneurysm with 28 x 10 Hemashield graft under moderate hypothermic circulatory arrest.  SURGEON:  Salvatore Decent. Dorris Fetch, MD  ASSISTANT:  Gershon Crane, [name].  ANESTHESIA:  General.  FINDINGS:  Transesophageal echocardiography showed a bicuspid aortic valve.  There was severe insufficiency and moderate stenosis. Fusion of left and right cusp with an oversized noncoronary cusp. Normal coronary ostia. Moderate leaflet calcification and  moderate annular calcification.  A 4.5 cm aneurysm transitioning to normal just past the takeoff of the innominate.  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  Good function of the  prosthetic valve post-bypass with no paravalvular leaks, preserved left ventricular function.  CLINICAL NOTE:  [name] [name] is a 31 year old man with a known bicuspid aortic valve.  He was recently being evaluated for a hernia repair and was referred for cardiology clearance.  An echocardiogram showed moderate aortic stenosis and severe aortic  insufficiency and a dilated ascending aorta.  CT of the chest showed a 4.5 to 4.6 cm ascending aneurysm, although the root itself appeared normal size.  The patient was  referred for aortic valve replacement and repair of the aneurysm.  The indications,  risks, benefits, and alternatives were discussed in detail with the patient.  He understood and accepted the risks and wished to proceed.  DESCRIPTION OF PROCEDURE:  [name] [name] was brought to the preoperative holding area on 09/27/2021.  Dr. Mal Amabile of Anesthesia placed bilateral arterial lines and a Swan-Ganz catheter.  He was taken to the operating room and anesthetized and intubated.  A  Foley catheter was placed.  Intravenous antibiotics were administered.  The chest, abdomen and upper legs were prepped and draped in the usual sterile fashion.  Transesophageal echocardiography was performed.  Please see Dr. Alejandro Mulling separately dictated  note for full details.  A timeout was performed.  An incision was made in the right deltopectoral groove.  The fat pad was identified and dissection was carried down into it. The artery was located just posterior to the subclavian vein.  The axillary artery was of relatively small  caliber, but was a good quality vessel.  Proximal and distal control were obtained.  5000 units of heparin was administered.  The vessel was clamped proximally and distally and an arteriotomy was made.  An 8 mm Hemashield graft was cut on a slight bevel and  was anastomosed end-to-side to the axillary artery with a running 5-0 Prolene suture.  After tying the suture, Coseal was applied.  The graft was de-aired and connected to the bypass circuit.  A Potts tie vessel loop was left on the axillary artery  distal to the cannulation site for control of perfusion to the right arm during the procedure.  A median sternotomy was performed.  Hemostasis was achieved.  A sternal retractor was placed and opened over time.  The pericardium was opened.  The heart  was normal in appearance.  The ascending aorta was aneurysmal and relatively long. Pericardial stay  sutures were placed.  The remainder of the full heparin dose  was given.  After confirming adequate anticoagulation with ACT measurement, the right atrium was cannulated with a dual-stage venous cannula.  Cardiopulmonary bypass was initiated.  Flows were  maintained per protocol.  A left ventricular vent was placed via a pursestring suture in the right superior pulmonary vein and a retrograde cardioplegia cannula was placed via a pursestring suture in the right atrium.  An antegrade cardioplegia cannula  was placed in the ascending aorta.  Carbon dioxide was insufflated per protocol.  The aorta was crossclamped and the left ventricle was emptied with the vent.  Cardiac arrest then was achieved with a combination of cold antegrade and retrograde KBC blood cardioplegia and topical iced saline.  An initial 500 mL of cardioplegia was  administered antegrade.  There was a diastolic arrest.  The remainder of the calculated dose of 1325 mL was administered retrograde.  There was good septal cooling.  The aorta was transected.  The aortic valve was inspected.  There was a bicuspid valve  with fusion of the left and right cusps.  The noncoronary cusp was relatively oversized.  The coronary ostia arose at their normal locations with the right coronary possibly displaced just a couple of millimeters closer to the right- noncoronary  commissure than average.  The valve leaflets were excised.  There was moderate annular calcification, particularly along the noncoronary cusp.  This calcium was debrided.  Care was taken to contain all calcific debris.  The annulus was copiously  irrigated with iced saline.  The sinuses of Valsalva were of normal size.  The sinotubular junction was normal and measured for a 28 mm Hemashield graft.  The annulus sized for a 23 mm On-X valve.  This was going to be a tight fit through the sinotubular  junction, so an incision was made into the noncoronary sinus.  The incision did not extend into the annulus.  It should be noted that additional  cardioplegia was administered after 60 minutes from the first crossclamp time and then at 30-minute  intervals thereafter. 2-0 Ethibond horizontal mattress sutures with subannular pledgets were placed circumferentially around the annulus.  A total of 16 sutures were utilized.  Sutures were passed through the sewing ring of the valve.  The valve was  lowered into place.  The valve seated nicely.  The sutures were sequentially tied.  The coronary ostia were inspected and there was no impingement.  The annulus was probed with a fine-tipped right angle and no gaps were noted. Both leaflets moved freely.  At this point, the patient had reached 24 degrees Celsius.  Steroids and etomidate were administered. The patient was placed in Trendelenburg position.  Circulatory arrest was initiated.  The crossclamp was removed.  The patient was exsanguinated.  The  arch was inspected.  There was an abrupt transition of the aorta between the innominate and the origin of the left common carotid.  The arch was divided just distal to the innominate.  A clamp was placed on the innominate artery distal to the aneurysmal  portion and antegrade cerebral perfusion was initiated.  The innominate aneurysm was resected separately.  A 28 mm Hemashield graft with a 10 mm sidearm was cut, removing the two additional side arms.  It then was anastomosed end-to-side to the distal  aorta.  The distal aorta was buttressed with a Teflon  felt strip.  A 4-0 running Prolene suture was used to perform the anastomosis. Coseal was applied.  The 10 mm side arm then was cut to length.  It was anastomosed end-to-end to the innominate artery with a  running 5-0 Prolene suture.  Again, Coseal was applied around the anastomosis.  The clamp was removed from the innominate artery.  Full flow was initiated.  The aortic graft was clamped.  The total circulatory arrest time was 32 minutes with antegrade  cerebral perfusion during 28 minutes.  Systemic  rewarming was begun.  The graft then was cut to length.  The proximal anastomosis was performed to the proximal aorta at the sinotubular junciton.  Before doing that, the incision that was made to allow the valve to pass through the sinotubular junction was repaired with a Hemashield patch taken from  the graft material.  The proximal anastomosis then was performed with a running 4-0 Prolene suture. The patient was placed in Trendelenburg position.  A McGoon needle was placed into the graft via a pursestring suture.  De-airing was performed.  The  proximal suture was tied.  The aortic crossclamp then was removed.  The total crossclamp time was 106 minutes.  The patient initially fibrillated.  He required 2 defibrillations with 10 joules and then was in sinus rhythm thereafter.  While rewarming  was completed, bleeding sites at the anastomoses were repaired with 4-0 Prolene pledgeted sutures.  Epicardial pacing wires were placed on the right ventricle and right atrium.  When the patient had rewarmed to a core temperature of 37 degrees Celsius, a  trial wean was performed and there appeared to be good function of the prosthetic valve and good left ventricular function.  The patient was placed back on bypass and the left ventricular vent was removed with additional de-airing performed.  The  retrograde cardioplegia cannula was removed.  The patient then was weaned from cardiopulmonary bypass on the first attempt without difficulty.  He was in sinus rhythm in the 60s and on no inotropic support.  He did require Neo-Synephrine for  vasodilatation.  The initial cardiac index was 1.9 liters per minute per meter squared.  He remained hemodynamically stable thereafter.  A test dose of protamine was administered and was well tolerated.  The left ventricular vent was removed.  The atrial cannula was removed. After administering the remainder of the protamine, the axillary artery graft was stapled and that wound was   packed.  Hemostasis was achieved.  There was some coagulopathy. Thromboelastogram showed need for FFP and cryoprecipitate.  He also was given packed red blood cells.  Chest was copiously irrigated with warm saline.  The pericardium was reapproximated  with interrupted 3-0 silk sutures.  It came together easily without tension.  Two mediastinal chest tubes were placed, one was a 36-French chest tube and the other was a 28-French Blake drain.  The sternum was closed with a combination of single and  double heavy gauge stainless steel wires.  The pectoralis fascia, subcutaneous tissue and skin were closed in standard fashion.  The packing was removed from the incision in the right deltopectoral groove.  This was irrigated.  Inspection was made for hemostasis.  The wound then was closed in standard fashion in 3 layers.  All sponge, needle and instrument counts were correct at the end of the procedure.  Experienced assistance was necessary for this case due to surgical complexity. Gershon Crane, [place] performed that role providing exposure, retraction of delicate tissues, suctioning  and suture management.  The patient was transported from the operating room to the Surgical Intensive Care Unit, intubated and in good condition.     PAA D: 09/27/2021 6:50:05 pm T: 09/28/2021 3:13:00 am  JOB: [id number]/ [id number]

## 2021-09-28 NOTE — Progress Notes (Signed)
? ?   ?  [address] ?      [name] [address] ?            [phone]   ? ?  ?Resting comfortably, visiting with family ? ?BP 108/68   Pulse 71   Temp 97.9 ?F (36.6 ?C) (Oral)   Resp 18   Ht 5\' 11"  (1.803 m)   Wt 95.5 kg   SpO2 97%   BMI 29.36 kg/m?  ? ?Intake/Output Summary (Last 24 hours) at 09/28/2021 1826 ?Last data filed at 09/28/2021 1536 ?Gross per 24 hour  ?Intake 2103.53 ml  ?Output 2530 ml  ?Net -426.47 ml  ? ?HCt 27 ?K= 4.1, creatinine 0.92 ?CBG moderately elevated- on levemir + SSI ? ?Looks great POD # 1 ? ?09/30/2021 Salvatore Decent, MD ?[facility] ?[phone] ? ?

## 2021-09-28 NOTE — Addendum Note (Signed)
Addendum  created 09/28/21 0956 by Adair Laundry, CRNA  ? Order list changed, Pharmacy for encounter modified  ?  ?

## 2021-09-28 NOTE — Hospital Course (Addendum)
HPI: [name] [name] was sent for consultation regarding abnormal bicuspid aortic valve and ascending aneurysm. ? ?[name] [name] is a 31 year old man with a longstanding heart murmur and a known bicuspid aortic valve.  Past medical history is also significant for pancreatitis, asthma, pneumonia and migraines.  He recently was being evaluated for a right inguinal hernia repair.  He was sent for medical clearance due to his heart murmur.  He was referred to Dr. Ali Lowe.  An echocardiogram showed a bicuspid aortic valve with moderate AS and severe AI.  There also was a dilated ascending aorta.  It was estimated at 5.2 cm.  A CT angio of the chest showed the aneurysm actually measures about 4.5 to 4.6 cm.  He had cardiac catheterization which revealed normal coronary arteries.  Right heart cath showed normal filling pressures and preserved cardiac index. ? ?He is not having any chest pain.  He does get short of breath with heavy exertion but nothing out of the ordinary.  He does complain of fatigue. ? ?The patient and his studies were reviewed by Dr. Roxan Hockey who recommended proceeding with elective aortic valve replacement with repair of a sending aortic and innominate artery aneurysms. ? ?Hospital course the patient was admitted electively and on 09/27/2021 he was taken to the operating room at which time he underwent aortic valve replacement with a 23 mm On-X mechanical valve as well as repair of a sending aortic and innominate artery aneurysms under moderate hypothermic circulatory arrest.  He tolerated the procedure well was taken to the surgical intensive care unit in stable condition. ? ?Postoperative hospital course: ? ?The patient has remained hemodynamically stable and neurologically intact.  His chest tubes were removed on postoperative day #1.  He is noted to have an expected acute blood loss anemia and has required transfusion.  He has been started on warfarin for his mechanical aortic valve.  Daily  INR's are obtained.  He does have some expected postoperative volume overload and is being diuresed.  Creatinine has remained within normal limits.  He has progressed well in regards to his routine cardiac rehab.  Most recent INR is 2.5 mg and plan is for discharge on 3 mg of Coumadin with Coumadin clinic appointment in a couple days.  The patient is maintaining NSR.  He is ambulating independently.  His surgical incisions are healing without evidence of infection.  He is medically stable for discharge home today. ?

## 2021-09-29 ENCOUNTER — Inpatient Hospital Stay (HOSPITAL_COMMUNITY): Payer: Commercial Managed Care - PPO

## 2021-09-29 LAB — CBC
HCT: 23 % — ABNORMAL LOW (ref 39.0–52.0)
Hemoglobin: 7.7 g/dL — ABNORMAL LOW (ref 13.0–17.0)
MCH: 31.4 pg (ref 26.0–34.0)
MCHC: 33.5 g/dL (ref 30.0–36.0)
MCV: 93.9 fL (ref 80.0–100.0)
Platelets: 116 10*3/uL — ABNORMAL LOW (ref 150–400)
RBC: 2.45 MIL/uL — ABNORMAL LOW (ref 4.22–5.81)
RDW: 13.9 % (ref 11.5–15.5)
WBC: 9.4 10*3/uL (ref 4.0–10.5)
nRBC: 0 % (ref 0.0–0.2)

## 2021-09-29 LAB — BASIC METABOLIC PANEL
Anion gap: 5 (ref 5–15)
BUN: 8 mg/dL (ref 6–20)
CO2: 26 mmol/L (ref 22–32)
Calcium: 8.2 mg/dL — ABNORMAL LOW (ref 8.9–10.3)
Chloride: 104 mmol/L (ref 98–111)
Creatinine, Ser: 0.68 mg/dL (ref 0.61–1.24)
GFR, Estimated: >60 mL/min (ref >60)
Glucose, Bld: 116 mg/dL — ABNORMAL HIGH (ref 70–99)
Potassium: 3.8 mmol/L (ref 3.5–5.1)
Sodium: 135 mmol/L (ref 135–145)

## 2021-09-29 LAB — GLUCOSE, CAPILLARY
Glucose-Capillary: 107 mg/dL — ABNORMAL HIGH (ref 70–99)
Glucose-Capillary: 109 mg/dL — ABNORMAL HIGH (ref 70–99)
Glucose-Capillary: 111 mg/dL — ABNORMAL HIGH (ref 70–99)

## 2021-09-29 LAB — PROTIME-INR
INR: 1.3 — ABNORMAL HIGH (ref 0.8–1.2)
Prothrombin Time: 16 seconds — ABNORMAL HIGH (ref 11.4–15.2)

## 2021-09-29 MED ORDER — ~~LOC~~ CARDIAC SURGERY, PATIENT & FAMILY EDUCATION: Freq: Once | Status: DC

## 2021-09-29 MED ORDER — ALUM & MAG HYDROXIDE-SIMETH 200-200-20 MG/5ML PO SUSP: 15.0000 mL | Freq: Four times a day (QID) | ORAL | Status: DC | PRN

## 2021-09-29 MED ORDER — SODIUM CHLORIDE 0.9% FLUSH
3.0000 mL | Freq: Two times a day (BID) | INTRAVENOUS | Status: DC
  Administered 2021-09-30 – 2021-10-01 (×4): 3 mL via INTRAVENOUS

## 2021-09-29 MED ORDER — LORATADINE 10 MG PO TABS: 10.0000 mg | ORAL_TABLET | Freq: Every day | ORAL | Status: DC | PRN

## 2021-09-29 MED ORDER — ZOLPIDEM TARTRATE 5 MG PO TABS
5.0000 mg | ORAL_TABLET | Freq: Every evening | ORAL | Status: DC | PRN
Start: 2021-09-29 — End: 2021-10-02
  Administered 2021-09-30: 5 mg via ORAL
  Filled 2021-09-29: qty 1

## 2021-09-29 MED ORDER — FUROSEMIDE 20 MG PO TABS
20.0000 mg | ORAL_TABLET | Freq: Every day | ORAL | Status: AC
  Administered 2021-09-29 – 2021-10-01 (×3): 20 mg via ORAL
  Filled 2021-09-29 (×3): qty 1

## 2021-09-29 MED ORDER — ALBUTEROL SULFATE (2.5 MG/3ML) 0.083% IN NEBU: 2.5000 mg | INHALATION_SOLUTION | Freq: Four times a day (QID) | RESPIRATORY_TRACT | Status: DC | PRN

## 2021-09-29 MED ORDER — MAGNESIUM HYDROXIDE 400 MG/5ML PO SUSP: 30.0000 mL | Freq: Every day | ORAL | Status: DC | PRN

## 2021-09-29 MED ORDER — WARFARIN SODIUM 5 MG PO TABS
5.0000 mg | ORAL_TABLET | Freq: Every day | ORAL | Status: DC
  Administered 2021-09-29 – 2021-10-01 (×3): 5 mg via ORAL
  Filled 2021-09-29 (×3): qty 1

## 2021-09-29 MED ORDER — METOPROLOL SUCCINATE ER 25 MG PO TB24
25.0000 mg | ORAL_TABLET | Freq: Every day | ORAL | Status: DC
  Administered 2021-09-29 – 2021-10-02 (×4): 25 mg via ORAL
  Filled 2021-09-29 (×4): qty 1

## 2021-09-29 MED ORDER — POTASSIUM CHLORIDE CRYS ER 20 MEQ PO TBCR
20.0000 meq | EXTENDED_RELEASE_TABLET | Freq: Every day | ORAL | Status: AC
  Administered 2021-09-29 – 2021-10-01 (×3): 20 meq via ORAL
  Filled 2021-09-29: qty 2
  Filled 2021-09-29 (×3): qty 1

## 2021-09-29 MED ORDER — SODIUM CHLORIDE 0.9 % IV SOLN: 250.0000 mL | INTRAVENOUS | Status: DC | PRN

## 2021-09-29 MED ORDER — SODIUM CHLORIDE 0.9% FLUSH: 3.0000 mL | INTRAVENOUS | Status: DC | PRN

## 2021-09-29 NOTE — Progress Notes (Signed)
2 Days Post-Op Procedure(s) (LRB): ?AORTIC VALVE REPLACEMENT (AVR) USING ON-X AORTIC VALVE SIZE (N/A) ?REPLACEMENT ASCENDING AND INNOMINATE ANEURYSM USING HEMASHIELD PLATINUM [id number] (N/A) ?TRANSESOPHAGEAL ECHOCARDIOGRAM (TEE) (N/A) ?Subjective: ?Pain about 4/10. Otherwise feels well, did well with walk ? ?Objective: ?Vital signs in last 24 hours: ?Temp:  [97.6 ?F (36.4 ?C)-100 ?F (37.8 ?C)] 97.6 ?F (36.4 ?C) (04/21 0700) ?Pulse Rate:  [70-89] 87 (04/21 0700) ?Cardiac Rhythm: Normal sinus rhythm (04/21 0000) ?Resp:  [0-34] 19 (04/21 0700) ?BP: (103-129)/(55-80) 119/78 (04/21 0700) ?SpO2:  [93 %-100 %] 94 % (04/21 0700) ?Arterial Line BP: (87-116)/(66-76) 116/66 (04/20 0900) ?Weight:  [94.6 kg] 94.6 kg (04/21 0600) ? ?Hemodynamic parameters for last 24 hours: ?PAP: (18-24)/(8-9) 18/8 ? ?Intake/Output from previous day: ?04/20 0701 - 04/21 0700 ?In: 802.2 [I.V.:195.4; Blood:450; IV Piggyback:156.8] ?Out: 2850 [Urine:2750; Chest Tube:100] ?Intake/Output this shift: ?No intake/output data recorded. ? ?General appearance: alert, cooperative, and no distress ?Neurologic: intact ?Heart: regular rate and rhythm and good valve click ?Lungs: clear to auscultation bilaterally ?Abdomen: normal findings: soft, non-tender ? ?Lab Results: ?Recent Labs  ?  09/28/21 ?1716 09/29/21 ?0451  ?WBC 13.7* 9.4  ?HGB 8.9* 7.7*  ?HCT 26.7* 23.0*  ?PLT 165 116*  ? ?BMET:  ?Recent Labs  ?  09/28/21 ?1716 09/29/21 ?0451  ?NA 135 135  ?K 4.1 3.8  ?CL 102 104  ?CO2 24 26  ?GLUCOSE 210* 116*  ?BUN 11 8  ?CREATININE 0.92 0.68  ?CALCIUM 8.2* 8.2*  ?  ?PT/INR:  ?Recent Labs  ?  09/29/21 ?0451  ?LABPROT 16.0*  ?INR 1.3*  ? ?ABG ?   ?Component Value Date/Time  ? PHART 7.383 09/27/2021 2158  ? HCO3 21.9 09/27/2021 2158  ? TCO2 23 09/27/2021 2158  ? ACIDBASEDEF 3.0 (H) 09/27/2021 2158  ? O2SAT 98 09/27/2021 2158  ? ?CBG (last 3)  ?Recent Labs  ?  09/29/21 ?0031 09/29/21 ?0505 09/29/21 ?0650  ?GLUCAP 109* 111* 107*  ? ? ?Assessment/Plan: ?S/P  Procedure(s) (LRB): ?AORTIC VALVE REPLACEMENT (AVR) USING ON-X AORTIC VALVE SIZE (N/A) ?REPLACEMENT ASCENDING AND INNOMINATE ANEURYSM USING HEMASHIELD PLATINUM [id number] (N/A) ?TRANSESOPHAGEAL ECHOCARDIOGRAM (TEE) (N/A) ?Plan for transfer to step-down: see transfer orders ?Looks great ?NEURO- intact ?CV- in SR, will dc pacing wires ? Change metoprolol to 25 daily ? Continue baby aspirin until INR increases ? Increase coumadin to 5 mg daily ?RESP- continue IS ?RENAL- creatinine and lytes OK ? Fluid overload- PO Lasix ?ENDO- CBG normalizing- dc CBGs ?Anemia secondary to ABL_ mild, follow ?Dc central line ?Continue ambulation ? ? ? LOS: 2 days  ? ? ?Loreli Slot ?09/29/2021 ? ? ?

## 2021-09-29 NOTE — Discharge Instructions (Addendum)
Discharge Instructions: ? ?1. You may shower, please wash incisions daily with soap and water and keep dry.  If you wish to cover wounds with dressing you may do so but please keep clean and change daily.  No tub baths or swimming until incisions have completely healed.  If your incisions become red or develop any drainage please call our office at [phone] ? ?2. No Driving until cleared by Dr. Sunday Corn office and you are no longer using narcotic pain medications ? ?3. Monitor your weight daily.. Please use the same scale and weigh at same time... If you gain 5-10 lbs in 48 hours with associated lower extremity swelling, please contact our office at [phone] ? ?4. Fever of 101.5 for at least 24 hours with no source, please contact our office at [phone] ? ?5. Activity- up as tolerated, please walk at least 3 times per day.  Avoid strenuous activity, no lifting, pushing, or pulling with your arms over 8-10 lbs for a minimum of 6 weeks ? ?6. If any questions or concerns arise, please do not hesitate to contact our office at [phone] ? ? ? ?Information on my medicine - Coumadin?   (Warfarin) ? ?This medication education was reviewed with me or my healthcare representative as part of my discharge preparation. ? ?Why was Coumadin prescribed for you? ?Coumadin was prescribed for you because you have a blood clot or a medical condition that can cause an increased risk of forming blood clots. Blood clots can cause serious health problems by blocking the flow of blood to the heart, lung, or brain. Coumadin can prevent harmful blood clots from forming. ?As a reminder your indication for Coumadin is:  Blood Clot Prevention after Heart Valve Surgery ? ?What test will check on my response to Coumadin? ?While on Coumadin (warfarin) you will need to have an INR test regularly to ensure that your dose is keeping you in the desired range. The INR (international normalized ratio) number is calculated from the  result of the laboratory test called prothrombin time (PT). ? ?If an INR APPOINTMENT HAS NOT ALREADY BEEN MADE FOR YOU please schedule an appointment to have this lab work done by your health care provider within 7 days. ?Your INR goal is 2-3 for 3 months, then 1.5-2.0 thereafter.  ? ?What  do you need to  know  About  COUMADIN? ?Take Coumadin (warfarin) exactly as prescribed by your healthcare provider about the same time each day.  DO NOT stop taking without talking to the doctor who prescribed the medication.  Stopping without other blood clot prevention medication to take the place of Coumadin may increase your risk of developing a new clot or stroke.  Get refills before you run out. ? ?What do you do if you miss a dose? ?If you miss a dose, take it as soon as you remember on the same day then continue your regularly scheduled regimen the next day.  Do not take two doses of Coumadin at the same time. ? ?Important Safety Information ?A possible side effect of Coumadin (Warfarin) is an increased risk of bleeding. You should call your healthcare provider right away if you experience any of the following: ?Bleeding from an injury or your nose that does not stop. ?Unusual colored urine (red or dark brown) or unusual colored stools (red or black). ?Unusual bruising for unknown reasons. ?A serious fall or if you hit your head (even if there is no bleeding). ? ?Some foods or medicines interact with Coumadin? (  warfarin) and might alter your response to warfarin. To help avoid this: ?Eat a balanced diet, maintaining a consistent amount of Vitamin K. ?Notify your provider about major diet changes you plan to make. ?Avoid alcohol or limit your intake to 1 drink for women and 2 drinks for men per day. ?(1 drink is 5 oz. wine, 12 oz. beer, or 1.5 oz. liquor.) ? ?Make sure that ANY health care provider who prescribes medication for you knows that you are taking Coumadin (warfarin).  Also make sure the healthcare provider who  is monitoring your Coumadin knows when you have started a new medication including herbals and non-prescription products. ? ?Coumadin? (Warfarin)  Major Drug Interactions  ?Increased Warfarin Effect Decreased Warfarin Effect  ?Alcohol (large quantities) ?Antibiotics (esp. Septra/Bactrim, Flagyl, Cipro) ?Amiodarone (Cordarone) ?Aspirin (ASA) ?Cimetidine (Tagamet) ?Megestrol (Megace) ?NSAIDs (ibuprofen, naproxen, etc.) ?Piroxicam (Feldene) ?Propafenone (Rythmol SR) ?Propranolol (Inderal) ?Isoniazid (INH) ?Posaconazole (Noxafil) Barbiturates (Phenobarbital) ?Carbamazepine (Tegretol) ?Chlordiazepoxide (Librium) ?Cholestyramine (Questran) ?Griseofulvin ?Oral Contraceptives ?Rifampin ?Sucralfate (Carafate) ?Vitamin K  ? ?Coumadin? (Warfarin) Major Herbal Interactions  ?Increased Warfarin Effect Decreased Warfarin Effect  ?Garlic ?Ginseng ?Ginkgo biloba Coenzyme Q10 ?Green tea ?St. John?s wort   ? ?Coumadin? (Warfarin) FOOD Interactions  ?Eat a consistent number of servings per week of foods HIGH in Vitamin K ?(1 serving = ? cup)  ?Collards (cooked, or boiled & drained) ?Kale (cooked, or boiled & drained) ?Mustard greens (cooked, or boiled & drained) ?Parsley *serving size only = ? cup ?Spinach (cooked, or boiled & drained) ?Swiss chard (cooked, or boiled & drained) ?Turnip greens (cooked, or boiled & drained)  ?Eat a consistent number of servings per week of foods MEDIUM-HIGH in Vitamin K ?(1 serving = 1 cup)  ?Asparagus (cooked, or boiled & drained) ?Broccoli (cooked, boiled & drained, or raw & chopped) ?Brussel sprouts (cooked, or boiled & drained) *serving size only = ? cup ?Lettuce, raw (green leaf, endive, romaine) ?Spinach, raw ?Turnip greens, raw & chopped  ? ?These websites have more information on Coumadin (warfarin):  http://www.king-russell.com/; ?www.http://hill-[facility]son.org/; ?  ?

## 2021-09-29 NOTE — Progress Notes (Signed)
Patient ID: [name] [name], male   DOB: 01/24/1991, 31 y.o.   MRN: [id number] ? ?[facility] Evening Rounds: ? ?Hemodynamically stable in sinus rhythm. ? ?Sats 100% on RA ? ?Good UO ? ?Awaiting bed on 4E. ?

## 2021-09-30 LAB — CBC
HCT: 21.6 % — ABNORMAL LOW (ref 39.0–52.0)
Hemoglobin: 7.4 g/dL — ABNORMAL LOW (ref 13.0–17.0)
MCH: 32.3 pg (ref 26.0–34.0)
MCHC: 34.3 g/dL (ref 30.0–36.0)
MCV: 94.3 fL (ref 80.0–100.0)
Platelets: 151 10*3/uL (ref 150–400)
RBC: 2.29 MIL/uL — ABNORMAL LOW (ref 4.22–5.81)
RDW: 13.9 % (ref 11.5–15.5)
WBC: 9.4 10*3/uL (ref 4.0–10.5)
nRBC: 0 % (ref 0.0–0.2)

## 2021-09-30 LAB — BASIC METABOLIC PANEL
Anion gap: 7 (ref 5–15)
BUN: 10 mg/dL (ref 6–20)
CO2: 26 mmol/L (ref 22–32)
Calcium: 8.5 mg/dL — ABNORMAL LOW (ref 8.9–10.3)
Chloride: 104 mmol/L (ref 98–111)
Creatinine, Ser: 0.79 mg/dL (ref 0.61–1.24)
GFR, Estimated: >60 mL/min (ref >60)
Glucose, Bld: 143 mg/dL — ABNORMAL HIGH (ref 70–99)
Potassium: 3.7 mmol/L (ref 3.5–5.1)
Sodium: 137 mmol/L (ref 135–145)

## 2021-09-30 LAB — PROTIME-INR
INR: 1.7 — ABNORMAL HIGH (ref 0.8–1.2)
Prothrombin Time: 19.6 seconds — ABNORMAL HIGH (ref 11.4–15.2)

## 2021-09-30 NOTE — Progress Notes (Signed)
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 Ambulating well. ? ?Objective: ?Vital signs in last 24 hours: ?Temp:  [98.3 ?F (36.8 ?C)-98.5 ?F (36.9 ?C)] 98.4 ?F (36.9 ?C) (04/22 0740) ?Pulse Rate:  [79-90] 90 (04/22 0400) ?Cardiac Rhythm: Normal sinus rhythm (04/21 2000) ?Resp:  [15-30] 19 (04/21 1600) ?BP: (91-126)/(59-82) 92/61 (04/22 0800) ?SpO2:  [92 %-100 %] 98 % (04/22 0800) ?Weight:  [93.2 kg] 93.2 kg (04/22 0600) ? ?Hemodynamic parameters for last 24 hours: ?  ? ?Intake/Output from previous day: ?04/21 0701 - 04/22 0700 ?In: 475 [P.O.:475] ?Out: 2625 [Urine:2625] ?Intake/Output this shift: ?Total I/O ?In: 120 [P.O.:120] ?Out: 200 [Urine:200] ? ?General appearance: alert and cooperative ?Neurologic: intact ?Heart: regular rate and rhythm, mechanical valve click, no murmur ?Lungs: clear to auscultation bilaterally ?Extremities: edema mild ?Wound: dressings dry ? ?Lab Results: ?Recent Labs  ?  09/28/21 ?1716 09/29/21 ?0451  ?WBC 13.7* 9.4  ?HGB 8.9* 7.7*  ?HCT 26.7* 23.0*  ?PLT 165 116*  ? ?BMET:  ?Recent Labs  ?  09/28/21 ?1716 09/29/21 ?0451  ?NA 135 135  ?K 4.1 3.8  ?CL 102 104  ?CO2 24 26  ?GLUCOSE 210* 116*  ?BUN 11 8  ?CREATININE 0.92 0.68  ?CALCIUM 8.2* 8.2*  ?  ?PT/INR:  ?Recent Labs  ?  09/29/21 ?0451  ?LABPROT 16.0*  ?INR 1.3*  ? ?ABG ?   ?Component Value Date/Time  ? PHART 7.383 09/27/2021 2158  ? HCO3 21.9 09/27/2021 2158  ? TCO2 23 09/27/2021 2158  ? ACIDBASEDEF 3.0 (H) 09/27/2021 2158  ? O2SAT 98 09/27/2021 2158  ? ?CBG (last 3)  ?Recent Labs  ?  09/29/21 ?0031 09/29/21 ?0505 09/29/21 ?0650  ?GLUCAP 109* 111* 107*  ? ? ?Assessment/Plan: ?S/P Procedure(s) (LRB): ?AORTIC VALVE REPLACEMENT (AVR) USING ON-X AORTIC VALVE SIZE (N/A) ?REPLACEMENT ASCENDING AND INNOMINATE  ANEURYSM USING HEMASHIELD PLATINUM [id number] (N/A) ?TRANSESOPHAGEAL ECHOCARDIOGRAM (TEE) (N/A) ? ?POD 3 ?Hemodynamically stable in sinus rhythm. Continue Toprol. ? ?Mild volume excess: continue lasix ? ?INR has not bumped yet. Continue Coumadin 5 mg daily. ? ?Waiting on 4E bed. ? ?Continue IS, ambulation. ? ? ? ? LOS: 3 days  ? ? ?Alleen Borne ?09/30/2021 ? ? ?

## 2021-09-30 NOTE — Plan of Care (Signed)
?  Problem: Pain Managment: ?Goal: General experience of comfort will improve ?Outcome: Progressing ?  ?Problem: Elimination: ?Goal: Will not experience complications related to bowel motility ?Outcome: Progressing ?Goal: Will not experience complications related to urinary retention ?Outcome: Progressing ?  ?Problem: Safety: ?Goal: Ability to remain free from injury will improve ?Outcome: Progressing ?  ?Problem: Activity: ?Goal: Risk for activity intolerance will decrease ?Outcome: Progressing ?  ?Problem: Education: ?Goal: Knowledge of the prescribed therapeutic regimen will improve ?Outcome: Progressing ?  ?

## 2021-09-30 NOTE — Progress Notes (Signed)
Arrived from [facility].  A&Ox3.  CHG. CCMD. Oriented to room. Vital Signs per protocol. ?

## 2021-10-01 LAB — TYPE AND SCREEN
ABO/RH(D): O NEG
Antibody Screen: NEGATIVE
Unit division: 0
Unit division: 0

## 2021-10-01 LAB — BPAM RBC
Blood Product Expiration Date: 202304242359
Blood Product Expiration Date: 202304252359
ISSUE DATE / TIME: 202304191032
ISSUE DATE / TIME: 202304200604
Unit Type and Rh: 9500
Unit Type and Rh: 9500

## 2021-10-01 LAB — PROTIME-INR
INR: 1.9 — ABNORMAL HIGH (ref 0.8–1.2)
Prothrombin Time: 21.8 seconds — ABNORMAL HIGH (ref 11.4–15.2)

## 2021-10-01 NOTE — Progress Notes (Addendum)
? ?   ?[address] ?      [name] [address] ?            [phone]   ? ?  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 ?Objective: ?Vital signs in last 24 hours: ?Temp:  [97.8 ?F (36.6 ?C)-98.8 ?F (37.1 ?C)] 98.6 ?F (37 ?C) (04/23 4536) ?Pulse Rate:  [76-108] 79 (04/23 0752) ?Cardiac Rhythm: Normal sinus rhythm (04/22 2116) ?Resp:  [18-20] 20 (04/23 0752) ?BP: (105-134)/(68-82) 108/69 (04/23 0752) ?SpO2:  [96 %-100 %] 98 % (04/23 0752) ?Weight:  [92.1 kg] 92.1 kg (04/23 0317) ? ?Hemodynamic parameters for last 24 hours: ?  ? ?Intake/Output from previous day: ?04/22 0701 - 04/23 0700 ?In: 240 [P.O.:240] ?Out: 2501 [Urine:2500; Stool:1] ?Intake/Output this shift: ?No intake/output data recorded. ? ?General appearance: alert, cooperative, and no distress ?Heart: regular rate and rhythm and crisp mechanical valve click ?Lungs: min dim in bases ?Abdomen: benign ?Extremities: no edema ?Wound: incis healing well ? ?Lab Results: ?Recent Labs  ?  09/29/21 ?0451 09/30/21 ?0840  ?WBC 9.4 9.4  ?HGB 7.7* 7.4*  ?HCT 23.0* 21.6*  ?PLT 116* 151  ? ?BMET:  ?Recent Labs  ?  09/29/21 ?0451 09/30/21 ?0840  ?NA 135 137  ?K 3.8 3.7  ?CL 104 104  ?CO2 26 26  ?GLUCOSE 116* 143*  ?BUN 8 10  ?CREATININE 0.68 0.79  ?CALCIUM 8.2* 8.5*  ?  ?PT/INR:  ?Recent Labs  ?  10/01/21 ?0507  ?LABPROT 21.8*  ?INR 1.9*  ? ?ABG ?   ?Component Value Date/Time  ? PHART 7.383 09/27/2021 2158  ? HCO3 21.9 09/27/2021 2158  ? TCO2 23 09/27/2021 2158  ? ACIDBASEDEF 3.0 (H) 09/27/2021 2158  ? O2SAT 98 09/27/2021 2158  ? ?CBG (last 3)  ?Recent Labs  ?  09/29/21 ?0031 09/29/21 ?0505 09/29/21 ?0650  ?GLUCAP 109* 111* 107*  ? ? ?Meds ?Scheduled Meds: ? acetaminophen  1,000 mg Oral Q6H  ? Or  ? acetaminophen  (TYLENOL) oral liquid 160 mg/5 mL  1,000 mg Per Tube Q6H  ? aspirin EC  81 mg Oral Daily  ? bisacodyl  10 mg Oral Daily  ? Or  ? bisacodyl  10 mg Rectal Daily  ? Chlorhexidine Gluconate Cloth  6 each Topical Daily  ? [facility], Patient & Family Education   Does not apply Once  ? docusate sodium  200 mg Oral Daily  ? enoxaparin (LOVENOX) injection  40 mg Subcutaneous QHS  ? metoprolol succinate  25 mg Oral Daily  ? pantoprazole  40 mg Oral Daily  ? sodium chloride flush  3 mL Intravenous Q12H  ? warfarin  5 mg Oral q1600  ? Warfarin - Physician Dosing Inpatient   Does not apply q1600  ? ?Continuous Infusions: ? sodium chloride    ? ?PRN Meds:.sodium chloride, albuterol, alum & mag hydroxide-simeth, loratadine, magnesium hydroxide, metoprolol tartrate, ondansetron (ZOFRAN) IV, oxyCODONE, sodium chloride flush, zolpidem ? ?Xrays ?No results found. ? 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 ?1 afeb, VSS, sinus rhythm ?2 sats good on RA ?3 voiding well, weight trending down , about 3 kg >preop ?4 INR 1.9, cont  coumadin 5 mg ?5 will d/w MD poss home today if INR acceptable ? ? ? ? ? LOS: 4 days  ? ? ?Rowe Clack ?10/01/2021 ?  ? Chart reviewed, patient examined, agree with above. ?Doing well.overall. His INR is 1.9 but I am not sure if he is going to require 5 mg Coumadin or less. He is only POD 4 so will see what INR is tomorrow am and then should have a better idea of what Coumadin dose to use for him. ?

## 2021-10-02 ENCOUNTER — Other Ambulatory Visit: Payer: Self-pay | Admitting: Physician Assistant

## 2021-10-02 ENCOUNTER — Other Ambulatory Visit: Payer: Self-pay | Admitting: Cardiology

## 2021-10-02 DIAGNOSIS — I350 Nonrheumatic aortic (valve) stenosis: Secondary | ICD-10-CM

## 2021-10-02 LAB — PROTIME-INR
INR: 2.5 — ABNORMAL HIGH (ref 0.8–1.2)
Prothrombin Time: 26.4 seconds — ABNORMAL HIGH (ref 11.4–15.2)

## 2021-10-02 MED ORDER — ASPIRIN 81 MG PO TBEC: 81.0000 mg | DELAYED_RELEASE_TABLET | Freq: Every day | ORAL | 11 refills | Status: AC

## 2021-10-02 MED ORDER — OXYCODONE HCL 5 MG PO TABS: 5.0000 mg | ORAL_TABLET | ORAL | 0 refills | Status: DC | PRN

## 2021-10-02 MED ORDER — WARFARIN SODIUM 3 MG PO TABS
3.0000 mg | ORAL_TABLET | Freq: Every day | ORAL | 3 refills | Status: DC
Start: 2021-10-02 — End: 2021-12-11

## 2021-10-02 MED ORDER — FERROUS SULFATE 325 (65 FE) MG PO TBEC: 325.0000 mg | DELAYED_RELEASE_TABLET | Freq: Two times a day (BID) | ORAL | 0 refills | Status: DC

## 2021-10-02 MED ORDER — METOPROLOL SUCCINATE ER 25 MG PO TB24: 25.0000 mg | ORAL_TABLET | Freq: Every day | ORAL | 3 refills | Status: DC

## 2021-10-02 MED ORDER — ACETAMINOPHEN 500 MG PO TABS: 500.0000 mg | ORAL_TABLET | Freq: Four times a day (QID) | ORAL | 0 refills | Status: DC | PRN

## 2021-10-02 NOTE — Progress Notes (Addendum)
? ?   ?  [address] ?      [name] [address] ?            [phone]   ? ?  ? 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 ?Subjective: ? ?No new complaints.  Overall doing well, feels up to going home.  + ambulation   + BM ? ?Objective: ?Vital signs in last 24 hours: ?Temp:  [98.3 ?F (36.8 ?C)-98.9 ?F (37.2 ?C)] 98.3 ?F (36.8 ?C) (04/24 0515) ?Pulse Rate:  [79-97] 85 (04/24 0515) ?Cardiac Rhythm: Normal sinus rhythm (04/23 1900) ?Resp:  [16-20] 16 (04/24 0515) ?BP: (108-138)/(69-88) 116/88 (04/24 0515) ?SpO2:  [97 %-100 %] 98 % (04/24 0515) ?Weight:  [88.7 kg] 88.7 kg (04/24 0710) ? ?Intake/Output from previous day: ?04/23 0701 - 04/24 0700 ?In: 240 [P.O.:240] ?Out: -  ? ?General appearance: alert, cooperative, and no distress ?Heart: regular rate and rhythm ?Lungs: clear to auscultation bilaterally ?Abdomen: soft, non-tender; bowel sounds normal; no masses,  no organomegaly ?Extremities: extremities normal, atraumatic, no cyanosis or edema ?Wound: clean and dry ? ?Lab Results: ?Recent Labs  ?  09/30/21 ?0840  ?WBC 9.4  ?HGB 7.4*  ?HCT 21.6*  ?PLT 151  ? ?BMET:  ?Recent Labs  ?  09/30/21 ?0840  ?NA 137  ?K 3.7  ?CL 104  ?CO2 26  ?GLUCOSE 143*  ?BUN 10  ?CREATININE 0.79  ?CALCIUM 8.5*  ?  ?PT/INR:  ?Recent Labs  ?  10/02/21 ?0302  ?LABPROT 26.4*  ?INR 2.5*  ? ?ABG ?   ?Component Value Date/Time  ? PHART 7.383 09/27/2021 2158  ? HCO3 21.9 09/27/2021 2158  ? TCO2 23 09/27/2021 2158  ? ACIDBASEDEF 3.0 (H) 09/27/2021 2158  ? O2SAT 98 09/27/2021 2158  ? ?CBG (last 3)  ?No results for input(s): GLUCAP in the last 72 hours. ? ?Assessment/Plan: ?S/P Procedure(s) (LRB): ?AORTIC VALVE REPLACEMENT (AVR) USING ON-X AORTIC VALVE SIZE (N/A) ?REPLACEMENT ASCENDING AND INNOMINATE ANEURYSM USING HEMASHIELD  PLATINUM [id number] (N/A) ?TRANSESOPHAGEAL ECHOCARDIOGRAM (TEE) (N/A) ? ?CV- NSR, Bp stable-- continue lopressor ?INR 2.5, will reduce coumadin to 3 mg daily ?Pulm- no acute issues, off oxygen, continue IS ?Expected post operative blood loss anemia, moderate Hgb at 7.4, patient asymptomatic ?Dispo- patient stable, will plan to d/c home today. ? ? LOS: 5 days  ? ?[name], [name]' ?10/02/2021 ? ?Patient seen and examined, agree with above ?Looks great ?Home today ?F/u for PT/INR arranged ? ?[name], MD ?[facility] ?[phone] ? ?

## 2021-10-02 NOTE — Plan of Care (Signed)
IV removed, site clean dry and intact. CT sutures removed and steri strips placed.  Instructed patient on incisional site care. ? ?AVS printed and reviewed with patient.  Patient stated understanding of how and when to take medications.  Follow up appointments reviewed with patient.  Instructed patient to follow up with cardiology office tomorrow afternoon if he has not yet received his appointment for INR follow up by then.     ?

## 2021-10-02 NOTE — Progress Notes (Signed)
CARDIAC REHAB PHASE I  ? ?D/c education completed with pt. Pt educated on importance of site care along with monitoring incisions daily. Encouraged continued IS use, ambulation, and sternal precautions. Pt given in-the-tube sheet along with heart healthy diet. Reviewed restrictions and exercise guidelines. Will refer to CPR II [place]. ? ?[phone] ?Reynold Bowen, RN BSN ?10/02/2021 ?8:45 AM ? ?

## 2021-10-04 ENCOUNTER — Telehealth: Payer: Self-pay

## 2021-10-04 ENCOUNTER — Ambulatory Visit (INDEPENDENT_AMBULATORY_CARE_PROVIDER_SITE_OTHER): Payer: Commercial Managed Care - PPO

## 2021-10-04 DIAGNOSIS — Z7901 Long term (current) use of anticoagulants: Secondary | ICD-10-CM

## 2021-10-04 DIAGNOSIS — Z5181 Encounter for therapeutic drug level monitoring: Secondary | ICD-10-CM

## 2021-10-04 DIAGNOSIS — Z952 Presence of prosthetic heart valve: Secondary | ICD-10-CM

## 2021-10-04 LAB — POCT INR: INR: 3 (ref 2.0–3.0)

## 2021-10-04 MED FILL — Albumin, Human Inj 5%: INTRAVENOUS | Qty: 250 | Status: AC

## 2021-10-04 MED FILL — Lidocaine HCl Local Preservative Free (PF) Inj 2%: INTRAMUSCULAR | Qty: 15 | Status: AC

## 2021-10-04 MED FILL — Magnesium Sulfate Inj 50%: INTRAMUSCULAR | Qty: 10 | Status: AC

## 2021-10-04 MED FILL — Mannitol IV Soln 20%: INTRAVENOUS | Qty: 500 | Status: AC

## 2021-10-04 MED FILL — Heparin Sodium (Porcine) Inj 1000 Unit/ML: INTRAMUSCULAR | Qty: 10 | Status: AC

## 2021-10-04 MED FILL — Heparin Sodium (Porcine) Inj 1000 Unit/ML: INTRAMUSCULAR | Qty: 2500 | Status: AC

## 2021-10-04 MED FILL — Electrolyte-R (PH 7.4) Solution: INTRAVENOUS | Qty: 5000 | Status: AC

## 2021-10-04 MED FILL — Sodium Chloride IV Soln 0.9%: INTRAVENOUS | Qty: 2000 | Status: AC

## 2021-10-04 MED FILL — Sodium Bicarbonate IV Soln 8.4%: INTRAVENOUS | Qty: 50 | Status: AC

## 2021-10-04 MED FILL — Heparin Sodium (Porcine) Inj 1000 Unit/ML: Qty: 1000 | Status: AC

## 2021-10-04 MED FILL — Potassium Chloride Inj 2 mEq/ML: INTRAVENOUS | Qty: 40 | Status: AC

## 2021-10-04 NOTE — Telephone Encounter (Signed)
Transition Care Management Unsuccessful Follow-up Telephone Call ? ?Date of discharge and from where:  10/02/21 [facility] ? ?Attempts:  1st Attempt ? ?Reason for unsuccessful TCM follow-up call:  Left voice message ? ? ?  ?

## 2021-10-04 NOTE — Patient Instructions (Signed)
Description   ?START taking 3mg  daily EXCEPT 0.5 tablet on Monday, Wednesday, and Friday.  ?Stay consistent with greens each week  ?Recheck INR in 1 week.  ?[facility] [phone] ? ?(*INR Goal 2-3 for 3 months, then decrease to 1.5-2*) ?  ?   ?

## 2021-10-11 ENCOUNTER — Ambulatory Visit (INDEPENDENT_AMBULATORY_CARE_PROVIDER_SITE_OTHER): Payer: Commercial Managed Care - PPO | Admitting: *Deleted

## 2021-10-11 DIAGNOSIS — Z952 Presence of prosthetic heart valve: Secondary | ICD-10-CM

## 2021-10-11 DIAGNOSIS — Z7901 Long term (current) use of anticoagulants: Secondary | ICD-10-CM

## 2021-10-11 LAB — POCT INR: INR: 1.2 — AB (ref 2.0–3.0)

## 2021-10-11 NOTE — Patient Instructions (Signed)
Description   ?Today take 1 tablet then start taking 1 tablet daily EXCEPT 1/2 tablet on Monday. Stay consistent with greens each week. Recheck INR on Monday with MD appt ?(*INR Goal 2-3 for 3 months, then decrease to 1.5-2*) ?  ?  ?

## 2021-10-16 ENCOUNTER — Encounter: Payer: Self-pay | Admitting: Internal Medicine

## 2021-10-16 ENCOUNTER — Ambulatory Visit (INDEPENDENT_AMBULATORY_CARE_PROVIDER_SITE_OTHER): Payer: Commercial Managed Care - PPO | Admitting: Internal Medicine

## 2021-10-16 ENCOUNTER — Ambulatory Visit (INDEPENDENT_AMBULATORY_CARE_PROVIDER_SITE_OTHER): Payer: Commercial Managed Care - PPO | Admitting: *Deleted

## 2021-10-16 VITALS — BP 108/60 | HR 88 | Ht 71.0 in | Wt 187.0 lb

## 2021-10-16 DIAGNOSIS — Z952 Presence of prosthetic heart valve: Secondary | ICD-10-CM

## 2021-10-16 DIAGNOSIS — Z7901 Long term (current) use of anticoagulants: Secondary | ICD-10-CM

## 2021-10-16 DIAGNOSIS — K439 Ventral hernia without obstruction or gangrene: Secondary | ICD-10-CM | POA: Diagnosis not present

## 2021-10-16 DIAGNOSIS — R42 Dizziness and giddiness: Secondary | ICD-10-CM | POA: Diagnosis not present

## 2021-10-16 LAB — POCT INR: INR: 1.3 — AB (ref 2.0–3.0)

## 2021-10-16 MED ORDER — METOPROLOL SUCCINATE ER 25 MG PO TB24: 12.5000 mg | ORAL_TABLET | Freq: Every day | ORAL | 3 refills | Status: DC

## 2021-10-16 NOTE — Progress Notes (Addendum)
?Cardiology Office Note:   ? ?Date:  10/16/2021  ? ?ID:  [name], DOB February 17, 1991, MRN [id number] ? ?PCP:  Jeanie Sewer, NP  ? ?[facility] Prov[facility] ?Cardiologist:  Lenna Sciara, MD ?Referring MD: Jeanie Sewer, NP  ? ?Chief Complaint/Reason for Referral: Cardiology follow-up ? ?ASSESSMENT:   ? ?S/P AVR (aortic valve replacement) and aortoplasty ? ?Long term (current) use of anticoagulants ? ?Hernia of abdominal wall ? ?Dizziness ? ?PLAN:   ? ?In order of problems listed above: ?1.  Status post aortic valve replacement: The patient will continue anticoagulation lifelong with goal INR of 1.5-2 with aspirin 81 mg daily indefinitely.  He is being followed by anticoagulation clinic.  He has an echocardiogram scheduled.  Patient will follow-up with cardiothoracic surgery as an outpatient and has been contacted by cardiac rehabilitation already.  I will see him back in 1 year or earlier if needed.   ?2.  Anticoagulation:  Goal INR 1.5 to 2 with indefinite ASA 81mg ; followed by anticoagulation clinic. ?3.  Abdominal wall hernia: I think it is okay if the patient were to schedule his hernia repair.  His back to date work is July 11.  I think this is fine to have his hernia repair prior to this date.  He he will need bridging with a heparin infusion perioperatively.  He will follow-up with the cardiothoracic surgery division to discuss this further. ?4.  Dizziness: We will decrease his Toprol to 12.5 mg and change to bedtime dosing due to mild orthostasis. ?  ? ?Dispo:  Return in about 1 year (around 10/17/2022).  ?  ? ?Medication Adjustments/Labs and Tests Ordered: ?Current medicines are reviewed at length with the patient today.  Concerns regarding medicines are outlined above.  ? ?Tests Ordered: ?No orders of the defined types were placed in this encounter. ? ? ?Medication Changes: ?Meds ordered this encounter  ?Medications  ? metoprolol succinate (TOPROL XL) 25 MG 24 hr tablet  ?  Sig:  Take 0.5 tablets (12.5 mg total) by mouth at bedtime.  ?  Dispense:  45 tablet  ?  Refill:  3  ? ? ?History of Present Illness:   ? ?FOCUSED PROBLEM LIST:   ?1.  Severe bicuspid aortic valve disease consisting of moderate to severe aortic regurgitation and moderate aortic valve stenosis with ascending aortic aneurysm status post aortic valve replacement with 23 mm On-X mechanical valve and ascending aortic aneurysm replacement April 2023 ?2.  Family history of bicuspid aortic valve ?3.  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 ?The patient returns for follow-up.  He underwent elective aortic valve replacement and ascending aortic aneurysm repair in April of this year.  He had an uncomplicated postoperative course and was discharged 5 days following his surgery. ? ?The patient is doing well.  He tells me he is a little lightheaded at times but has had no frank syncope.  His incisions are healing well though he does have some paresthesias and occasional soreness but seem to be better with Tylenol.  His breathing is much better.  He has had no severe bleeding or bruising on Coumadin.  He has required no emergency room visits or hospitalizations.  He otherwise feels well. ? ? ?Current Medications: ?Current Meds  ?Medication Sig  ? acetaminophen (TYLENOL) 500 MG tablet Take 1-2 tablets (500-1,000 mg total) by mouth every 6 (six) hours as needed.  ? albuterol (VENTOLIN HFA) 108 (90 Base)  MCG/ACT inhaler Inhale 2 puffs into the lungs every 6 (six) hours as needed for wheezing or shortness of breath.  ? aspirin EC 81 MG EC tablet Take 1 tablet (81 mg total) by mouth daily. Swallow whole.  ? ferrous sulfate 325 (65 FE) MG EC tablet Take 1 tablet (325 mg total) by mouth 2 (two) times daily.  ? loratadine (CLARITIN) 10 MG tablet Take 10 mg by mouth daily as needed for allergies.  ? metoprolol succinate (TOPROL XL) 25 MG 24 hr tablet Take 0.5 tablets (12.5 mg  total) by mouth at bedtime.  ? oxyCODONE (OXY IR/ROXICODONE) 5 MG immediate release tablet Take 1 tablet (5 mg total) by mouth every 4 (four) hours as needed for severe pain.  ? warfarin (COUMADIN) 3 MG tablet Take 1 tablet (3 mg total) by mouth daily at 4 PM.  ? [DISCONTINUED] metoprolol succinate (TOPROL-XL) 25 MG 24 hr tablet Take 1 tablet (25 mg total) by mouth daily.  ?  ? ?Allergies:    ?Amoxicillin  ? ?Social History:   ?Social History  ? ?Tobacco Use  ? Smoking status: Never  ? Smokeless tobacco: Never  ?Vaping Use  ? Vaping Use: Never used  ?Substance Use Topics  ? Alcohol use: Yes  ?  Comment: 10 drinks a week wine  ? Drug use: Not Currently  ?  Comment: Edibles occasionally marijuana  ?  ? ?Family Hx: ?Family History  ?Problem Relation Age of Onset  ? Pancreatitis Maternal Uncle   ?  ? ?Review of Systems:   ?Please see the history of present illness.    ?All other systems reviewed and are negative. ?  ? ? ?EKGs/Labs/Other Test Reviewed:   ? ?EKG: Sinus rhythm left anterior fascicular block ? ?Prior CV studies: ?TTE 2023 ?1. Left ventricular ejection fraction, by estimation, is 60 to 65%. The  ?left ventricle has normal function. The left ventricle has no regional  ?wall motion abnormalities. There is mild left ventricular hypertrophy.  ?Left ventricular diastolic parameters  ?were normal.  ? 2. Right ventricular systolic function is normal. The right ventricular  ?size is normal. There is normal pulmonary artery systolic pressure. The  ?estimated right ventricular systolic pressure is 123XX123 mmHg.  ? 3. Left atrial size was mildly dilated.  ? 4. The mitral valve is normal in structure. Trivial mitral valve  ?regurgitation.  ? 5. The inferior vena cava is normal in size with greater than 50%  ?respiratory variability, suggesting right atrial pressure of 3 mmHg.  ? 6. The aortic valve was not well visualized. Aortic valve regurgitation  ?is moderate to severe. Moderate aortic valve stenosis. Vmax 3.2 m/s, MG  23  ?mmHg, AVA 1.5 cm^2, DI 0.27. Eccentric AI jet appears moderate visually,  ?but likely underestimating due to  ? eccentric jet. There does appear to be holodiastolic flow reversal in  ?aorta, argues for severe AI  ? 7. Aortic dilatation noted. 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  ? ?Imaging studies that I have independently reviewed today: CT ? ?Recent Labs: ?08/02/2021: TSH 2.57 ?09/25/2021: ALT 22 ?09/28/2021: Magnesium 2.1 ?09/30/2021: BUN 10; Creatinine, Ser 0.79; Hemoglobin 7.4; Platelets 151; Potassium 3.7; Sodium 137  ? ?Recent Lipid Panel ?Lab Results  ?Component Value Date/Time  ? CHOL 183 08/02/2021 08:30 AM  ? TRIG 106.0 08/02/2021 08:30 AM  ? HDL 66.00 08/02/2021 08:30 AM  ? LDLCALC 96 08/02/2021 08:30 AM  ? ? ?Risk Assessment/Calculations:   ? ? ?    ? ?  Physical Exam:   ? ?VS:  BP 108/60   Pulse 88   Ht 5\' 11"  (1.803 m)   Wt 187 lb (84.8 kg)   SpO2 98%   BMI 26.08 kg/m?    ?Wt Readings from Last 3 Encounters:  ?10/16/21 187 lb (84.8 kg)  ?10/02/21 195 lb 8.8 oz (88.7 kg)  ?09/25/21 199 lb (90.3 kg)  ?  ?GENERAL:  No apparent distress, AOx3 ?HEENT: Radiation of cardiac murmur to carotids, +2 carotid impulses, no scleral icterus ?CAR: RRR with mechanical S2 and well-healed chest wall incisions ?RES:  Clear to auscultation bilaterally ?ABD:  Soft, nontender, nondistended, positive bowel sounds x 4 ?VASC:  +2 radial pulses, +2 carotid pulses, palpable pedal pulses ?NEURO:  CN 2-12 grossly intact; motor and sensory grossly intact ?PSYCH:  No active depression or anxiety ?EXT:  No edema, ecchymosis, or cyanosis ? ?Signed, ?Early Osmond, MD  ?10/16/2021 3:12 PM    ?[facility] ?[address], [place], [place]  [address] ?Phone: [phone]; Fax: [phone]  ? ?Note:  This document was prepared using Dragon voice recognition software and may include unintentional dictation errors. ?

## 2021-10-16 NOTE — Patient Instructions (Signed)
Description   ?Today take 1 tablet and 1.5 tablets tomorrow then start taking 1 tablet daily EXCEPT 1.5 tablets on Tuesdays, Thursdays, Saturdays. Stay consistent with greens each week. Recheck INR in 1 week. (*INR Goal 2-3 for 3 months, then decrease to 1.5-2*) ?  ?  ?

## 2021-10-16 NOTE — Patient Instructions (Signed)
Medication Instructions:  ?Your physician has recommended you make the following change in your medication:  ?1.) decrease Toprol XL to 12.5 mg daily at bedtime ? ?*If you need a refill on your cardiac medications before your next appointment, please call your pharmacy* ? ? ?Lab Work: ?none ? ? ?Testing/Procedures: ?Your physician has requested that you have an echocardiogram. Echocardiography is a painless test that uses sound waves to create images of your heart. It provides your doctor with information about the size and shape of your heart and how well your heart?s chambers and valves are working. This procedure takes approximately one hour. There are no restrictions for this procedure. ? ? ?Follow-Up: ?At [facility], you and your health needs are our priority.  As part of our continuing mission to provide you with exceptional heart care, we have created designated Provider Care Teams.  These Care Teams include your primary Cardiologist (physician) and Advanced Practice Providers (APPs -  Physician Assistants and Nurse Practitioners) who all work together to provide you with the care you need, when you need it. ? ?We recommend signing up for the patient portal called "MyChart".  Sign up information is provided on this After Visit Summary.  MyChart is used to connect with patients for Virtual Visits (Telemedicine).  Patients are able to view lab/test results, encounter notes, upcoming appointments, etc.  Non-urgent messages can be sent to your provider as well.   ?To learn more about what you can do with MyChart, go to [email or web address].   ? ?Your next appointment:   ?12 month(s) ? ?The format for your next appointment:   ?In Person ? ?Provider:   ?Orbie Pyo, MD   ? ? ?Important Information About Sugar ? ? ? ? ?  ?

## 2021-10-19 ENCOUNTER — Telehealth (HOSPITAL_COMMUNITY): Payer: Self-pay

## 2021-10-19 NOTE — Telephone Encounter (Signed)
Called patient to see if he is interested in the Cardiac Rehab Program. Patient expressed interest. Explained scheduling process and went over insurance, patient verbalized understanding. Will contact patient for scheduling once f/u has been completed.  °

## 2021-10-19 NOTE — Telephone Encounter (Signed)
Pt insurance is active and benefits verified through [place], DED $1,500/$1,500 met, out of pocket $3,500/$3,500 met, co-insurance 20%. no pre-authorization required. Passport, [employer] 10/11/2021@10 [name], REF# [id number] ?  ?Will contact patient to see if he is interested in the Cardiac Rehab Program. If interested, patient will need to complete follow up appt. Once completed, patient will be contacted for scheduling upon review by the RN Navigator. ?

## 2021-10-23 ENCOUNTER — Ambulatory Visit (INDEPENDENT_AMBULATORY_CARE_PROVIDER_SITE_OTHER): Payer: Commercial Managed Care - PPO | Admitting: *Deleted

## 2021-10-23 DIAGNOSIS — Z952 Presence of prosthetic heart valve: Secondary | ICD-10-CM | POA: Diagnosis not present

## 2021-10-23 DIAGNOSIS — Z7901 Long term (current) use of anticoagulants: Secondary | ICD-10-CM

## 2021-10-23 LAB — POCT INR: INR: 1.3 — AB (ref 2.0–3.0)

## 2021-10-23 NOTE — Patient Instructions (Signed)
Description   ?Today take 2 tablets and 2 tablets tomorrow then start taking 1.5 tablets daily. Stay consistent with greens each week. Recheck INR in 1 week. (*INR Goal 2-3 for 3 months, then decrease to 1.5-2*) ?  ?  ?

## 2021-10-30 ENCOUNTER — Other Ambulatory Visit: Payer: Self-pay | Admitting: Thoracic Surgery (Cardiothoracic Vascular Surgery)

## 2021-10-30 DIAGNOSIS — Z952 Presence of prosthetic heart valve: Secondary | ICD-10-CM

## 2021-10-31 ENCOUNTER — Ambulatory Visit (INDEPENDENT_AMBULATORY_CARE_PROVIDER_SITE_OTHER): Payer: Commercial Managed Care - PPO | Admitting: *Deleted

## 2021-10-31 ENCOUNTER — Encounter: Payer: Self-pay | Admitting: Thoracic Surgery (Cardiothoracic Vascular Surgery)

## 2021-10-31 ENCOUNTER — Ambulatory Visit
Admission: RE | Admit: 2021-10-31 | Discharge: 2021-10-31 | Disposition: A | Discharge status: Home / Self Care | Payer: Commercial Managed Care - PPO | Source: Ambulatory Visit | Attending: Thoracic Surgery (Cardiothoracic Vascular Surgery) | Admitting: Thoracic Surgery (Cardiothoracic Vascular Surgery)

## 2021-10-31 ENCOUNTER — Ambulatory Visit (INDEPENDENT_AMBULATORY_CARE_PROVIDER_SITE_OTHER): Payer: Self-pay | Admitting: Thoracic Surgery (Cardiothoracic Vascular Surgery)

## 2021-10-31 VITALS — BP 127/77 | HR 63 | Resp 20 | Ht 71.0 in | Wt 189.0 lb

## 2021-10-31 DIAGNOSIS — Z952 Presence of prosthetic heart valve: Secondary | ICD-10-CM

## 2021-10-31 DIAGNOSIS — I712 Thoracic aortic aneurysm, without rupture, unspecified: Secondary | ICD-10-CM

## 2021-10-31 DIAGNOSIS — I350 Nonrheumatic aortic (valve) stenosis: Secondary | ICD-10-CM

## 2021-10-31 DIAGNOSIS — Z7901 Long term (current) use of anticoagulants: Secondary | ICD-10-CM

## 2021-10-31 LAB — POCT INR: INR: 1.8 — AB (ref 2.0–3.0)

## 2021-10-31 NOTE — Progress Notes (Signed)
[address]       [name] [address]             [phone]    HPI: [name] [name] returns for a scheduled follow-up visit  [name] [name] is a 31 year old man with a history of pancreatitis, asthma, pneumonia, migraines, and a bicuspid aortic valve.  He is being evaluated for a hernia repair.  He had an echocardiogram which showed a bicuspid valve with moderate AAS and severe AI.  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  CT angio showed the aneurysm was about 4.6 cm.  He underwent aortic valve replacement with mechanical valve and repair of the aneurysm on 09/27/2021.  His postoperative course was uncomplicated.  He feels well.  He is not having much pain.  He is not taking any oxycodone.  He notes that he is starting to get some energy and stamina back.  He is anxious to have his hernia repaired.  His Coumadin dose was adjusted recently and he is getting his INR checked again today.  Past Medical History:  Diagnosis Date   Asthma    Headache    migraines   Heart murmur    Mild aortic stenosis    Pancreatitis    Pneumonia    as a child x several   Substance abuse (HCC)     Current Outpatient Medications  Medication Sig Dispense Refill   acetaminophen (TYLENOL) 500 MG tablet Take 1-2 tablets (500-1,000 mg total) by mouth every 6 (six) hours as needed. 30 tablet 0   albuterol (VENTOLIN HFA) 108 (90 Base) MCG/ACT inhaler Inhale 2 puffs into the lungs every 6 (six) hours as needed for wheezing or shortness of breath.     aspirin EC 81 MG EC tablet Take 1 tablet (81 mg total) by mouth daily. Swallow whole. 30 tablet 11   ferrous sulfate 325 (65 FE) MG EC tablet Take 1 tablet (325 mg total) by mouth 2 (two) times daily. 60 tablet 0   loratadine (CLARITIN) 10 MG tablet Take 10 mg by mouth daily as needed for allergies.     metoprolol succinate (TOPROL XL) 25 MG 24 hr tablet Take 0.5 tablets (12.5 mg total) by mouth at bedtime. 45 tablet 3   oxyCODONE (OXY IR/ROXICODONE) 5  MG immediate release tablet Take 1 tablet (5 mg total) by mouth every 4 (four) hours as needed for severe pain. 28 tablet 0   warfarin (COUMADIN) 3 MG tablet Take 1 tablet (3 mg total) by mouth daily at 4 PM. 30 tablet 3   No current facility-administered medications for this visit.    Physical Exam BP 127/77 (BP Location: Right Arm, Patient Position: Sitting)   Pulse 63   Resp 20   Ht 5\' 11"  (1.803 m)   Wt 189 lb (85.7 kg)   SpO2 99% Comment: RA  BMI 26.3 kg/m  31 year old man in no acute distress Alert and oriented x3 with no focal deficits Lungs clear with equal breath sounds bilaterally Cardiac regular rate and rhythm with a good valve click Sternum stable, incisions well-healed No peripheral edema  Diagnostic Tests: I personally reviewed the chest x-ray images.  No effusions or infiltrates.  Impression: [name] [name] is a 31 year old man who had an aortic valve replacement and repair of an ascending aneurysm on 09/27/2021.  He is now about a month out from surgery.  He looks great.  His exercise tolerance is very good for this point and should continue to  improve.  His anticoagulation has been moving target but he had a recent dose adjustment to 4.5 mg of Coumadin daily and is having his INR repeated this afternoon.  I advised him not to lift anything over 10 pounds for another 2 weeks.  Beyond that there are no restrictions on his activities.  He may drive.  I think he is okay to go ahead and have his hernia repair pretty much anytime at this point.  Plan: I will plan to see him back in about 2 months to check on his progress  Loreli Slot, MD [facility] [phone]

## 2021-10-31 NOTE — Patient Instructions (Signed)
Description   Today take 2 tablets then start taking 1.5 tablets daily except 2 tablets on Thursdays. Stay consistent with greens each week. Recheck INR in 1 week. (*INR Goal 2-3 for 3 months, then decrease to 1.5-2*). Surgical Clearance Fax number [phone] Main [phone]

## 2021-11-07 ENCOUNTER — Ambulatory Visit (INDEPENDENT_AMBULATORY_CARE_PROVIDER_SITE_OTHER): Payer: Commercial Managed Care - PPO | Admitting: *Deleted

## 2021-11-07 ENCOUNTER — Ambulatory Visit (HOSPITAL_COMMUNITY): Payer: Commercial Managed Care - PPO | Attending: Cardiovascular Disease

## 2021-11-07 DIAGNOSIS — Z5181 Encounter for therapeutic drug level monitoring: Secondary | ICD-10-CM

## 2021-11-07 DIAGNOSIS — Z952 Presence of prosthetic heart valve: Secondary | ICD-10-CM

## 2021-11-07 DIAGNOSIS — I350 Nonrheumatic aortic (valve) stenosis: Secondary | ICD-10-CM | POA: Diagnosis not present

## 2021-11-07 LAB — ECHOCARDIOGRAM COMPLETE
AR max vel: 1.36 cm2
AV Area VTI: 1.43 cm2
AV Area mean vel: 1.41 cm2
AV Mean grad: 11.8 mmHg
AV Peak grad: 23.3 mmHg
Ao pk vel: 2.42 m/s
Area-P 1/2: 2.48 cm2
S' Lateral: 3.1 cm

## 2021-11-07 LAB — POCT INR: INR: 3.2 — AB (ref 2.0–3.0)

## 2021-11-07 NOTE — Progress Notes (Signed)
Pt. Needs orders for upcoming surgery. ?

## 2021-11-07 NOTE — Patient Instructions (Signed)
DUE TO COVID-19 ONLY TWO VISITORS  (aged 31 and older)  ARE ALLOWED TO COME WITH YOU AND STAY IN THE WAITING ROOM ONLY DURING PRE OP AND PROCEDURE.   **NO VISITORS ARE ALLOWED IN THE SHORT STAY AREA OR RECOVERY ROOM!!**  IF YOU WILL BE ADMITTED INTO THE HOSPITAL YOU ARE ALLOWED ONLY FOUR SUPPORT PEOPLE DURING VISITATION HOURS ONLY (7 AM -8PM)   The support person(s) must pass our screening, gel in and out, and wear a mask at all times, including in the patient's room. Patients must also wear a mask when staff or their support person are in the room. Visitors GUEST BADGE MUST BE WORN VISIBLY  One adult visitor may remain with you overnight and MUST be in the room by 8 P.M.     Your procedure is scheduled on: 11/13/21   Report to [facility] Main Entrance    Report to admitting at : 10: 15 AM   Call this number if you have problems the morning of surgery [phone]   Do not eat food :After Midnight.   After Midnight you may have the following liquids until : 9:30 AM DAY OF SURGERY  Water Black Coffee (sugar ok, NO MILK/CREAM OR CREAMERS)  Tea (sugar ok, NO MILK/CREAM OR CREAMERS) regular and decaf                             Plain Jell-O (NO RED)                                           Fruit ices (not with fruit pulp, NO RED)                                     Popsicles (NO RED)                                                                  Juice: apple, WHITE grape, WHITE cranberry Sports drinks like Gatorade (NO RED) Clear broth(vegetable,chicken,beef)   Oral Hygiene is also important to reduce your risk of infection.                                    Remember - BRUSH YOUR TEETH THE MORNING OF SURGERY WITH YOUR REGULAR TOOTHPASTE   Do NOT smoke after Midnight   Take these medicines the morning of surgery with A SIP OF WATER: loratadine,metoprolol.Tylenol as needed.  Bring CPAP mask and tubing day of surgery.                              You may not have any  metal on your body including hair pins, jewelry, and body piercing             Do not wear lotions, powders, perfumes/cologne, or deodorant  Do not shave  48 hours prior to surgery.  Men may shave face and neck.   Do not bring valuables to the hospital. [place] IS NOT             RESPONSIBLE   FOR VALUABLES.   Contacts, dentures or bridgework may not be worn into surgery.   Bring small overnight bag day of surgery.    Patients discharged on the day of surgery will not be allowed to drive home.  Someone NEEDS to stay with you for the first 24 hours after anesthesia.   Special Instructions: Bring a copy of your healthcare power of attorney and living will documents         the day of surgery if you haven't scanned them before.              Please read over the following fact sheets you were given: IF YOU HAVE QUESTIONS ABOUT YOUR PRE-OP INSTRUCTIONS PLEASE CALL [phone]   [place] - Preparing for Surgery Before surgery, you can play an important role.  Because skin is not sterile, your skin needs to be as free of germs as possible.  You can reduce the number of germs on your skin by washing with CHG (chlorahexidine gluconate) soap before surgery.  CHG is an antiseptic cleaner which kills germs and bonds with the skin to continue killing germs even after washing. Please DO NOT use if you have an allergy to CHG or antibacterial soaps.  If your skin becomes reddened/irritated stop using the CHG and inform your nurse when you arrive at Short Stay. Do not shave (including legs and underarms) for at least 48 hours prior to the first CHG shower.  You may shave your face/neck. Please follow these instructions carefully:  1.  Shower with CHG Soap the night before surgery and the  morning of Surgery.  2.  If you choose to wash your hair, wash your hair first as usual with your  normal  shampoo.  3.  After you shampoo, rinse your hair and body thoroughly to remove the  shampoo.                            4.  Use CHG as you would any other liquid soap.  You can apply chg directly  to the skin and wash                       Gently with a scrungie or clean washcloth.  5.  Apply the CHG Soap to your body ONLY FROM THE NECK DOWN.   Do not use on face/ open                           Wound or open sores. Avoid contact with eyes, ears mouth and genitals (private parts).                       Wash face,  Genitals (private parts) with your normal soap.             6.  Wash thoroughly, paying special attention to the area where your surgery  will be performed.  7.  Thoroughly rinse your body with warm water from the neck down.  8.  DO NOT shower/wash with your normal soap after using and rinsing off  the CHG Soap.  9.  Pat yourself dry with a clean towel.            10.  Wear clean pajamas.            11.  Place clean sheets on your bed the night of your first shower and do not  sleep with pets. Day of Surgery : Do not apply any lotions/deodorants the morning of surgery.  Please wear clean clothes to the hospital/surgery center.  FAILURE TO FOLLOW THESE INSTRUCTIONS MAY RESULT IN THE CANCELLATION OF YOUR SURGERY PATIENT SIGNATURE_________________________________  NURSE SIGNATURE__________________________________  ________________________________________________________________________

## 2021-11-07 NOTE — Patient Instructions (Addendum)
Description   Take 1 tablet of warfarin today Then continue to take warfarin 1.5 tablets daily except 2 tablets on Fridays. Stay consistent with greens each week. Recheck INR in 1 week. (*INR Goal 2-3 for 3 months, then decrease to 1.5-2*). Surgical Clearance Fax number [phone] Main [phone]

## 2021-11-08 ENCOUNTER — Telehealth: Payer: Self-pay | Admitting: *Deleted

## 2021-11-08 NOTE — Telephone Encounter (Signed)
Clinical pharmacist to review Coumadin.  Based on the recent office note by Dr. Lynnette Caffey, "Abdominal wall hernia: I think it is okay if the patient were to schedule his hernia repair.  His back to date work is July 11.  I think this is fine to have his hernia repair prior to this date.  He he will need bridging with a heparin infusion perioperatively.  He will follow-up with the cardiothoracic surgery division to discuss this further."

## 2021-11-08 NOTE — Telephone Encounter (Addendum)
Patient with diagnosis of mechanical aortic valve replacement on warfarin for anticoagulation.    Procedure:  OPEN REPAIR INCARCERATED RIH WITH MESH Date of procedure: 11/13/21  I spoke with Dr. Lynnette Caffey who states since his valve is fresh, patient needs to be admitted for heparin drip. No lovenox bridge, will need admission for heparin drip.   Per Dr. Lynnette Caffey :  He needs to be admitted to hospital medicine or his (surgeon) service, stop the coumadin, start hep gtt and wait for his INR to be ok for surgery, then restart hep gtt, restart coumadin, and wait for his INR to be ok

## 2021-11-08 NOTE — Telephone Encounter (Signed)
    Patient Name: [name] [name]  DOB: 12-Sep-1990 MRN: [id number]  Primary Cardiologist: Orbie Pyo, MD  Chart reviewed as part of pre-operative protocol coverage. Given past medical history and time since last visit, based on ACC/AHA guidelines, [name] [name] would be at acceptable risk for the planned procedure without further cardiovascular testing.   Complicated factor in this case is patient's recent mechanical aortic valve replacement surgery for bicuspid aortic valve.  The case was discussed with Dr. Lynnette Caffey who recommended admit the patient for bridging with heparin drip prior to surgery.  He do not recommend Lovenox bridge.  Patient will need to be admitted to the hospitalist or his surgeon's service, stop the Coumadin, started on heparin drip and wait for his INR to be okay for surgery.  After surgery, he will need to restart heparin drip, restart Coumadin and wait for his INR to be okay prior to discharge.  I will route this recommendation to the requesting party via Epic fax function and remove from pre-op pool.  Please call with questions.  [place], [place] 11/08/2021, 1:03 PM

## 2021-11-08 NOTE — Telephone Encounter (Signed)
I have called and personally spoke with the triage nurse at the Dr. Tawana Scale office.  She has confirmed they have received the cardiac clearance note and she will deliver it to Dr. Andrey Campanile given the urgency.

## 2021-11-08 NOTE — Telephone Encounter (Signed)
   Pre-operative Risk Assessment    Patient Name: [name] [name]  DOB: [birth year] MRN: [id number]      Request for Surgical Clearance    Procedure:   OPEN REPAIR INCARCERATED RIH WITH MESH  Date of Surgery:  Clearance 11/13/21                                 Surgeon:  DR. Gaynelle Adu Surgeon's Group or Practice Name:  [facility] Phone number:  [phone] Fax number:  [phone] ATTN: [name], CMA   Type of Clearance Requested:   - Medical  - Pharmacy:  Hold Aspirin and Warfarin (Coumadin)     Type of Anesthesia:  General    Additional requests/questions:    [name]   11/08/2021, 9:53 AM

## 2021-11-09 ENCOUNTER — Inpatient Hospital Stay (HOSPITAL_COMMUNITY)
Admission: RE | Admit: 2021-11-09 | Discharge: 2021-11-09 | Disposition: A | Discharge status: Home / Self Care | Payer: Commercial Managed Care - PPO | Source: Ambulatory Visit

## 2021-11-09 DIAGNOSIS — I350 Nonrheumatic aortic (valve) stenosis: Secondary | ICD-10-CM

## 2021-11-13 ENCOUNTER — Ambulatory Visit: Admit: 2021-11-13 | Payer: Commercial Managed Care - PPO | Admitting: General Surgery

## 2021-11-13 SURGERY — REPAIR, HERNIA, INGUINAL, INCARCERATED
Anesthesia: General | Laterality: Right

## 2021-11-14 ENCOUNTER — Ambulatory Visit (INDEPENDENT_AMBULATORY_CARE_PROVIDER_SITE_OTHER): Payer: Commercial Managed Care - PPO

## 2021-11-14 DIAGNOSIS — Z7901 Long term (current) use of anticoagulants: Secondary | ICD-10-CM

## 2021-11-14 DIAGNOSIS — Z952 Presence of prosthetic heart valve: Secondary | ICD-10-CM | POA: Diagnosis not present

## 2021-11-14 LAB — POCT INR: INR: 2.4 (ref 2.0–3.0)

## 2021-11-14 NOTE — Patient Instructions (Signed)
continue to take warfarin 1.5 tablets daily except 2 tablets on Fridays. Stay consistent with greens each week. Recheck INR in 4 weeks. (*INR Goal 2-3 for 3 months, then decrease to 1.5-2*). Surgical Clearance Fax number [phone] Main [phone]

## 2021-11-21 ENCOUNTER — Telehealth (HOSPITAL_COMMUNITY): Payer: Self-pay

## 2021-11-21 NOTE — Telephone Encounter (Signed)
Pt is interested in scheduling for cardiac rehab but he is currently waiting on a date for his hernia surgery. Pt will call back once he has that date and will be able to schedule cardiac rehab after.

## 2021-12-01 ENCOUNTER — Ambulatory Visit: Payer: Commercial Managed Care - PPO | Admitting: Internal Medicine

## 2021-12-11 ENCOUNTER — Ambulatory Visit (INDEPENDENT_AMBULATORY_CARE_PROVIDER_SITE_OTHER): Payer: Commercial Managed Care - PPO | Admitting: *Deleted

## 2021-12-11 DIAGNOSIS — Z7901 Long term (current) use of anticoagulants: Secondary | ICD-10-CM | POA: Diagnosis not present

## 2021-12-11 DIAGNOSIS — Z952 Presence of prosthetic heart valve: Secondary | ICD-10-CM | POA: Diagnosis not present

## 2021-12-11 LAB — POCT INR: INR: 1.3 — AB (ref 2.0–3.0)

## 2021-12-11 MED ORDER — WARFARIN SODIUM 3 MG PO TABS: ORAL_TABLET | ORAL | 2 refills | Status: DC

## 2021-12-11 NOTE — Patient Instructions (Signed)
Description   Today and tomorrow take 2 tablets then continue to take warfarin 1.5 tablets daily except 2 tablets on Fridays. Stay consistent with greens each week. Recheck INR in 1 week. (*INR Goal 2-3 for 3 months, then decrease to 1.5-2*). Surgical Clearance Fax number [phone] Main [phone]

## 2021-12-18 ENCOUNTER — Ambulatory Visit (INDEPENDENT_AMBULATORY_CARE_PROVIDER_SITE_OTHER): Payer: Commercial Managed Care - PPO | Admitting: *Deleted

## 2021-12-18 DIAGNOSIS — Z7901 Long term (current) use of anticoagulants: Secondary | ICD-10-CM

## 2021-12-18 DIAGNOSIS — Z952 Presence of prosthetic heart valve: Secondary | ICD-10-CM | POA: Diagnosis not present

## 2021-12-18 LAB — POCT INR: INR: 1.8 — AB (ref 2.0–3.0)

## 2021-12-18 NOTE — Patient Instructions (Addendum)
Description   Today take 2 tablets then continue taking warfarin 1.5 tablets daily except 2 tablets on Fridays (goal may change to 1.5-2.0 per [facility]). Stay consistent with greens each week. Recheck INR in 2 weeks. (*INR Goal 2-3 for 3 months, then decrease to 1.5-2.0-SURGERY DATE WAS 09/27/2021*). Surgical Clearance Fax number [phone] Main [phone]

## 2021-12-20 ENCOUNTER — Telehealth: Payer: Self-pay | Admitting: Family

## 2021-12-20 ENCOUNTER — Encounter: Payer: Self-pay | Admitting: Internal Medicine

## 2021-12-20 ENCOUNTER — Telehealth: Payer: Self-pay | Admitting: *Deleted

## 2021-12-20 NOTE — Telephone Encounter (Signed)
A grievance was reported to the patient experience team.

## 2021-12-20 NOTE — Telephone Encounter (Signed)
Patient requests a new Referral for a new Surgeon for Hernia Surgery. Patient states he needs to have an appointment with a new Surgeon within the next 3 weeks. Previous Surgeon is unavailable until mid September, Patient states he cannot wait that long.

## 2021-12-20 NOTE — Telephone Encounter (Signed)
Spoke with patient regarding the scheduling of his hernia surgery. He is concerned that prolonging the waiting period and not having his hernia repaired has impaired his healing process from valve surgery. He asked that I notify [facility] regarding his concern over the delay. He also requested an advocate reach out to him because he feels that he has been mistreated. I advised that I will forward the request back to [facility] with our request that he have hernia repair so that he can complete cardiac rehab. I was also advised by [facility] management to complete a safety zone for delay in care.  Levi Aland, NP-C    12/20/2021, 4:21 PM [facility] [address], [address] Office [phone] Fax [phone]

## 2021-12-20 NOTE — Telephone Encounter (Signed)
error 

## 2021-12-20 NOTE — Telephone Encounter (Signed)
Pt is requesting a call back in regards to this previous clearance. He states that Dr. Tawana Scale office continues to push the surgery back, even past the suggest time to wait. He is requesting a call back to get advice on this and find out what he should do to get this scheduled. He also wants to notate that because of this delay he has not been able to start his cardiac rehab.

## 2021-12-20 NOTE — Telephone Encounter (Signed)
Hey team, patient is calling back regarding his surgery. What is the typical time that a persons would need to be therapeutic on coumadin prior to having hernia surgery? Dr. Dorris Fetch cleared him from his perspective on 10/31/21.  Thank you for your assistance.  Levi Aland, NP-C    12/20/2021, 2:25 PM [facility] [address] Office [phone] Fax [phone]

## 2021-12-20 NOTE — Telephone Encounter (Signed)
Patient contacted our office asking for help to get his abdominal hernia repair scheduled sooner than September. Patient states he can no longer wait to have repair performed. Advised patient to contact cardiology for cardiac clearance or PCP for referral to different surgical practice. Patient verbalized his understanding.

## 2021-12-21 NOTE — Telephone Encounter (Signed)
Thanks [name] - per the chart notes, there is a message from the [facility]? office nurse from yesterday stating pt had called requesting a surgery date. So that is fine if he is scheduled, I'll have Deja call and confirm pt is aware, thanks.

## 2021-12-21 NOTE — Telephone Encounter (Signed)
Please call [name] [name] and let him know what [name] found out about his hernia surgery scheduled for 7/27 - is he aware?  Let me know, thanks

## 2021-12-21 NOTE — Telephone Encounter (Signed)
[place]! Can you help please? [name] [name] has been thru a lot since I last saw him - had to have major cardiac sgy w/aortic valve replacement & now taking coumadin every day. I see where he was scheduled for hernia? surgery on 6/5 but it got cancelled and I don't see why? And is there no one else in the surgical practice that could do the surgery before September? Or is there another reason they are waiting besides scheduling?

## 2021-12-22 NOTE — Telephone Encounter (Signed)
I called and spoke with [name] [name], He stated he has everything resolved and has his surgery scheduled for 7/28 now. Pt states he is ok at this time, I let pt know to keep [place] updated.

## 2021-12-25 ENCOUNTER — Ambulatory Visit: Payer: Self-pay | Admitting: General Surgery

## 2021-12-25 DIAGNOSIS — Z7901 Long term (current) use of anticoagulants: Secondary | ICD-10-CM

## 2021-12-25 NOTE — Progress Notes (Signed)
Surgery orders requested via Epic inbox. °

## 2021-12-26 NOTE — Patient Instructions (Addendum)
DUE TO COVID-19 ONLY TWO VISITORS  (aged 31 and older)  ARE ALLOWED TO COME WITH YOU AND STAY IN THE WAITING ROOM ONLY DURING PRE OP AND PROCEDURE.   **NO VISITORS ARE ALLOWED IN THE SHORT STAY AREA OR RECOVERY ROOM!!**  IF YOU WILL BE ADMITTED INTO THE HOSPITAL YOU ARE ALLOWED ONLY FOUR SUPPORT PEOPLE DURING VISITATION HOURS ONLY (7 AM -8PM)   The support person(s) must pass our screening, gel in and out, and wear a mask at all times, including in the patient's room. Patients must also wear a mask when staff or their support person are in the room. Visitors GUEST BADGE MUST BE WORN VISIBLY  One adult visitor may remain with you overnight and MUST be in the room by 8 P.M.     Your procedure is scheduled on: 01/05/22   Report to [facility] Main Entrance    Report to admitting at : 12:15 PM   Call this number if you have problems the morning of surgery [phone]   Do not eat food :After Midnight.   After Midnight you may have the following liquids until : 11:30 AM DAY OF SURGERY  Water Black Coffee (sugar ok, NO MILK/CREAM OR CREAMERS)  Tea (sugar ok, NO MILK/CREAM OR CREAMERS) regular and decaf                             Plain Jell-O (NO RED)                                           Fruit ices (not with fruit pulp, NO RED)                                     Popsicles (NO RED)                                                                  Juice: apple, WHITE grape, WHITE cranberry Sports drinks like Gatorade (NO RED) Clear broth(vegetable,chicken,beef)  Drink  Ensure drink AT :11:30 AM the day of surgery.    The day of surgery:  Drink ONE (1) Pre-Surgery Clear Ensure or G2 at AM the morning of surgery. Drink in one sitting. Do not sip.  This drink was given to you during your hospital  pre-op appointment visit. Nothing else to drink after completing the  Pre-Surgery Clear Ensure or G2.          If you have questions, please contact your surgeon's  office.  FOLLOW BOWEL PREP AND ANY ADDITIONAL PRE OP INSTRUCTIONS YOU RECEIVED FROM YOUR SURGEON'S OFFICE!!!   Oral Hygiene is also important to reduce your risk of infection.                                    Remember - BRUSH YOUR TEETH THE MORNING OF SURGERY WITH YOUR REGULAR TOOTHPASTE   Do NOT smoke after Midnight   Take these medicines the morning  of surgery with A SIP OF WATER: loratadine,metoprolol.Use inhalers as usual.  DO NOT TAKE ANY ORAL DIABETIC MEDICATIONS DAY OF YOUR SURGERY  Bring CPAP mask and tubing day of surgery.                              You may not have any metal on your body including hair pins, jewelry, and body piercing             Do not wear lotions, powders, perfumes/cologne, or deodorant              Men may shave face and neck.   Do not bring valuables to the hospital. [facility] IS NOT             RESPONSIBLE   FOR VALUABLES.   Contacts, dentures or bridgework may not be worn into surgery.   Bring small overnight bag day of surgery.   DO NOT BRING YOUR HOME MEDICATIONS TO THE HOSPITAL. PHARMACY WILL DISPENSE MEDICATIONS LISTED ON YOUR MEDICATION LIST TO YOU DURING YOUR ADMISSION IN THE HOSPITAL!    Patients discharged on the day of surgery will not be allowed to drive home.  Someone NEEDS to stay with you for the first 24 hours after anesthesia.   Special Instructions: Bring a copy of your healthcare power of attorney and living will documents         the day of surgery if you haven't scanned them before.              Please read over the following fact sheets you were given: IF YOU HAVE QUESTIONS ABOUT YOUR PRE-OP INSTRUCTIONS PLEASE CALL [phone]     [facility] - Preparing for Surgery Before surgery, you can play an important role.  Because skin is not sterile, your skin needs to be as free of germs as possible.  You can reduce the number of germs on your skin by washing with CHG (chlorahexidine gluconate) soap before surgery.  CHG is  an antiseptic cleaner which kills germs and bonds with the skin to continue killing germs even after washing. Please DO NOT use if you have an allergy to CHG or antibacterial soaps.  If your skin becomes reddened/irritated stop using the CHG and inform your nurse when you arrive at Short Stay. Do not shave (including legs and underarms) for at least 48 hours prior to the first CHG shower.  You may shave your face/neck. Please follow these instructions carefully:  1.  Shower with CHG Soap the night before surgery and the  morning of Surgery.  2.  If you choose to wash your hair, wash your hair first as usual with your  normal  shampoo.  3.  After you shampoo, rinse your hair and body thoroughly to remove the  shampoo.                           4.  Use CHG as you would any other liquid soap.  You can apply chg directly  to the skin and wash                       Gently with a scrungie or clean washcloth.  5.  Apply the CHG Soap to your body ONLY FROM THE NECK DOWN.   Do not use on face/ open  Wound or open sores. Avoid contact with eyes, ears mouth and genitals (private parts).                       Wash face,  Genitals (private parts) with your normal soap.             6.  Wash thoroughly, paying special attention to the area where your surgery  will be performed.  7.  Thoroughly rinse your body with warm water from the neck down.  8.  DO NOT shower/wash with your normal soap after using and rinsing off  the CHG Soap.                9.  Pat yourself dry with a clean towel.            10.  Wear clean pajamas.            11.  Place clean sheets on your bed the night of your first shower and do not  sleep with pets. Day of Surgery : Do not apply any lotions/deodorants the morning of surgery.  Please wear clean clothes to the hospital/surgery center.  FAILURE TO FOLLOW THESE INSTRUCTIONS MAY RESULT IN THE CANCELLATION OF YOUR SURGERY PATIENT  SIGNATURE_________________________________  NURSE SIGNATURE__________________________________  ________________________________________________________________________

## 2021-12-27 ENCOUNTER — Ambulatory Visit (INDEPENDENT_AMBULATORY_CARE_PROVIDER_SITE_OTHER): Payer: Commercial Managed Care - PPO

## 2021-12-27 ENCOUNTER — Other Ambulatory Visit: Payer: Self-pay

## 2021-12-27 ENCOUNTER — Encounter (HOSPITAL_COMMUNITY): Payer: Self-pay

## 2021-12-27 ENCOUNTER — Encounter (HOSPITAL_COMMUNITY)
Admission: RE | Admit: 2021-12-27 | Discharge: 2021-12-27 | Disposition: A | Discharge status: Home / Self Care | Payer: Commercial Managed Care - PPO | Source: Ambulatory Visit | Attending: General Surgery | Admitting: General Surgery

## 2021-12-27 DIAGNOSIS — Z952 Presence of prosthetic heart valve: Secondary | ICD-10-CM

## 2021-12-27 DIAGNOSIS — Z7901 Long term (current) use of anticoagulants: Secondary | ICD-10-CM | POA: Insufficient documentation

## 2021-12-27 DIAGNOSIS — Z01812 Encounter for preprocedural laboratory examination: Secondary | ICD-10-CM | POA: Insufficient documentation

## 2021-12-27 LAB — COMPREHENSIVE METABOLIC PANEL
ALT: 20 U/L (ref 0–44)
AST: 27 U/L (ref 15–41)
Albumin: 4.2 g/dL (ref 3.5–5.0)
Alkaline Phosphatase: 60 U/L (ref 38–126)
Anion gap: 7 (ref 5–15)
BUN: 11 mg/dL (ref 6–20)
CO2: 26 mmol/L (ref 22–32)
Calcium: 9.3 mg/dL (ref 8.9–10.3)
Chloride: 107 mmol/L (ref 98–111)
Creatinine, Ser: 0.96 mg/dL (ref 0.61–1.24)
GFR, Estimated: >60 mL/min (ref >60)
Glucose, Bld: 92 mg/dL (ref 70–99)
Potassium: 4.4 mmol/L (ref 3.5–5.1)
Sodium: 140 mmol/L (ref 135–145)
Total Bilirubin: 0.4 mg/dL (ref 0.3–1.2)
Total Protein: 7.7 g/dL (ref 6.5–8.1)

## 2021-12-27 LAB — CBC WITH DIFFERENTIAL/PLATELET
Abs Immature Granulocytes: 0.02 10*3/uL (ref 0.00–0.07)
Basophils Absolute: 0 10*3/uL (ref 0.0–0.1)
Basophils Relative: 1 %
Eosinophils Absolute: 0.3 10*3/uL (ref 0.0–0.5)
Eosinophils Relative: 4 %
HCT: 39.7 % (ref 39.0–52.0)
Hemoglobin: 12.8 g/dL — ABNORMAL LOW (ref 13.0–17.0)
Immature Granulocytes: 0 %
Lymphocytes Relative: 24 %
Lymphs Abs: 1.6 10*3/uL (ref 0.7–4.0)
MCH: 28.4 pg (ref 26.0–34.0)
MCHC: 32.2 g/dL (ref 30.0–36.0)
MCV: 88.2 fL (ref 80.0–100.0)
Monocytes Absolute: 0.8 10*3/uL (ref 0.1–1.0)
Monocytes Relative: 12 %
Neutro Abs: 3.8 10*3/uL (ref 1.7–7.7)
Neutrophils Relative %: 59 %
Platelets: 328 10*3/uL (ref 150–400)
RBC: 4.5 MIL/uL (ref 4.22–5.81)
RDW: 14.6 % (ref 11.5–15.5)
WBC: 6.5 10*3/uL (ref 4.0–10.5)
nRBC: 0 % (ref 0.0–0.2)

## 2021-12-27 LAB — POCT INR: INR: 2.1 (ref 2.0–3.0)

## 2021-12-27 MED ORDER — ENOXAPARIN SODIUM 80 MG/0.8ML IJ SOSY: 80.0000 mg | PREFILLED_SYRINGE | Freq: Two times a day (BID) | INTRAMUSCULAR | 1 refills | Status: DC

## 2021-12-27 NOTE — Patient Instructions (Addendum)
Description   Continue taking warfarin 1.5 tablets daily except 2 tablets on Fridays until 7/22 and then follow procedure instructions.  Stay consistent with greens each week. Recheck INR 1 week post procedure. (*INR Goal 2-3 for 3 months, then decrease to 1.5-2.0-SURGERY DATE WAS 09/27/2021*). Surgical Clearance Fax number [phone] Main [phone]      7/22: Last dose of warfarin.  7/23: No warfarin or enoxaparin (Lovenox).  7/24: Inject enoxaparin 80mg  in the fatty abdominal tissue at least 2 inches from the belly button twice a day about 12 hours apart, 8am and 8pm rotate sites. No warfarin.  7/25: Inject enoxaparin in the fatty tissue every 12 hours, 8am and 8pm. No warfarin.  7/26: Inject enoxaparin in the fatty tissue every 12 hours, 8am and 8pm. No warfarin.  7/27: Inject enoxaparin in the fatty tissue in the morning at 8 am (No PM dose). No warfarin.  7/28: Procedure Day - No enoxaparin - Resume warfarin in the evening or as directed by doctor (take an extra half tablet with usual dose for 2 days then resume normal dose).  7/29: Resume enoxaparin inject in the fatty tissue every 12 hours and take warfarin  7/30: Inject enoxaparin in the fatty tissue every 12 hours and take warfarin  7/31: Inject enoxaparin in the fatty tissue every 12 hours and take warfarin  8/1: Inject enoxaparin in the fatty tissue every 12 hours and take warfarin  8/2: Inject enoxaparin in the fatty tissue every 12 hours and take warfarin  8/3: warfarin appt to check INR.

## 2021-12-27 NOTE — Progress Notes (Signed)
For Short Stay: COVID SWAB appointment date: Date of COVID positive in last 90 days:  Bowel Prep reminder:   For Anesthesia: PCP - [name]: NP Cardiologist - Dr. [name]  Chest x-ray - 10/31/21 EKG - 09/28/21 Stress Test -  ECHO - 11/07/21 Cardiac Cath - 09/07/21 Pacemaker/ICD device last checked: Pacemaker orders received: Device Rep notified:  Spinal Cord Stimulator:  Sleep Study -  CPAP -   Fasting Blood Sugar -  Checks Blood Sugar _____ times a day Date and result of last Hgb A1c-  Blood Thinner Instructions: Coumadin will be held: 12/31/21.as per Dr. Lynnette Caffey instructions. Aspirin Instructions: Last Dose:  Activity level: Can go up a flight of stairs and activities of daily living without stopping and without chest pain and/or shortness of breath   Able to exercise without chest pain and/or shortness of breath   Unable to go up a flight of stairs without chest pain and/or shortness of breath     Anesthesia review: Heart murmur,mild aorta stenosis.  Patient denies shortness of breath, fever, cough and chest pain at PAT appointment   Patient verbalized understanding of instructions that were given to them at the PAT appointment. Patient was also instructed that they will need to review over the PAT instructions again at home before surgery.

## 2022-01-01 ENCOUNTER — Other Ambulatory Visit: Payer: Self-pay | Admitting: *Deleted

## 2022-01-01 ENCOUNTER — Other Ambulatory Visit: Payer: Self-pay | Admitting: Thoracic Surgery (Cardiothoracic Vascular Surgery)

## 2022-01-01 DIAGNOSIS — Z7901 Long term (current) use of anticoagulants: Secondary | ICD-10-CM

## 2022-01-01 DIAGNOSIS — Z952 Presence of prosthetic heart valve: Secondary | ICD-10-CM

## 2022-01-01 MED ORDER — ENOXAPARIN SODIUM 80 MG/0.8ML IJ SOSY: 80.0000 mg | PREFILLED_SYRINGE | Freq: Two times a day (BID) | INTRAMUSCULAR | 1 refills | Status: DC

## 2022-01-01 NOTE — Telephone Encounter (Addendum)
Pt called and stated that he is not able to get his lovenox from [facility] because it is too expensive. Pt stated that he is having problems with his insurance company. Pt stated that the lovenox was going to be over $1,000.00 and if he ran it under insurance it would be $200.00 and still too expensive.   Found Good RX coupon and resent prescription to [facility] so pt can get 16 syringes for $45.87. Called [facility] pharmacy to make sure that pt could get Lovenox for that price. Informed pt to give [place] a calll if he has any trouble. Pt stated that he is not to be able to pick up the lovenox until after 4:00 pm today. Pt was suppose to start lovenox this morning. Informed pt to count this morning as a missed dose and to start Lovenox once he picks it up. Informed pt to give [place] a call if he has further questions or if we could be further assistance.   Good Rx: Lovenox 80mg /0.25ml (16 syringes)  BIN: 4m PCN: [id number] Group:[id number] Member ID: [id number]

## 2022-01-02 ENCOUNTER — Ambulatory Visit: Payer: Commercial Managed Care - PPO | Admitting: Thoracic Surgery (Cardiothoracic Vascular Surgery)

## 2022-01-02 NOTE — Progress Notes (Signed)
Anesthesia Chart Review   Case: [id number] Date/Time: 01/05/22 1235   Procedure: OPEN REPAIR INCARCERATED RIGHT INGUINAL HERNIA WITH MESH (Right) - 90 MIN - ROOM 2   Anesthesia type: General   Pre-op diagnosis: INCARCERATED RIGHT INGUINAL HERNIA   Location: [facility] ROOM 02 / [facility]   Surgeons: Gaynelle Adu, MD       DISCUSSION:31 y.o. 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 Gaynelle Adu.   Pt s/p AVR 09/27/21. Last seen by cardiothoracic surgeron 10/31/21. Per OV note stable at this visit, good exercise tolerance. Per OV note ok to proceed with hernia repair.   Pt advised to hold Warfarin and start Lovenox bridge prior to surgery.  Last dose of Warfarin 7/22. Last dose of Lovenox 8am 7/27.   Anticipate pt can proceed with planned procedure barring acute status change.   VS: BP 131/71   Pulse (!) 58   Temp (!) 36.3 C (Oral)   Ht 5\' 11"  (1.803 m)   Wt 89.4 kg   SpO2 100%   BMI 27.48 kg/m   PROVIDERS: , NP is PCP   Primary Cardiologist: [name], MD  Orbie Pyo, MD is Cardiothoracic surgeon LABS: Labs reviewed: Acceptable for surgery. (all labs ordered are listed, but only abnormal results are displayed)  Labs Reviewed  CBC WITH DIFFERENTIAL/PLATELET - Abnormal; Notable for the following components:      Result Value   Hemoglobin 12.8 (*)    All other components within normal limits  COMPREHENSIVE METABOLIC PANEL     IMAGES:   EKG: 09/28/2021 Rate 78 bpm Normal sinus rhythm Possible Acute pericarditis vs J point elevation Abnormal ECG When compared with ECG of 27-Sep-2021 15:13, the rate is increased  CV: Echo 11/07/2021  1. Mild septal dyskinesis consistent with postoperative state. Left  ventricular ejection fraction, by estimation, is 60 to 65%. Left  ventricular ejection fraction  by PLAX is 63 %. The left ventricle has  normal function. The left ventricle has no  regional wall motion abnormalities. There is moderate left ventricular  hypertrophy. Left ventricular diastolic parameters were normal.   2. Right ventricular systolic function is normal. The right ventricular  size is normal. There is normal pulmonary artery systolic pressure.   3. The mitral valve is normal in structure. Trivial mitral valve  regurgitation. No evidence of mitral stenosis.   4. The aortic valve has been repaired/replaced. Aortic valve  regurgitation is not visualized. No aortic stenosis is present. There is a  24 mm On-X mechanical valve present in the aortic position. Procedure  Date: 09/28/2021. Echo findings are consistent  with normal structure and function of the aortic valve prosthesis. Aortic  valve area, by VTI measures 1.43 cm. Aortic valve mean gradient measures  11.8 mmHg. Aortic valve Vmax measures 2.42 m/s.   5. 28 x 10 Hemishield graft repair of the ascending aorta and innominate  artery. Aortic root/ascending aorta has been repaired/replaced. There is  mild dilatation of the aortic root, measuring 41 mm.   6. The inferior vena cava is normal in size with greater than 50%  respiratory variability, suggesting right atrial pressure of 3 mmHg. Past Medical History:  Diagnosis Date   Asthma    Headache    migraines   Heart murmur    Mild aortic stenosis    Pancreatitis    Pneumonia    as  a child x several   Substance abuse [facility])     Past Surgical History:  Procedure Laterality Date   AORTIC VALVE REPLACEMENT N/A 09/27/2021   Procedure: AORTIC VALVE REPLACEMENT (AVR) USING ON-X AORTIC VALVE SIZE ;  Surgeon: Loreli Slot, MD;  Location: [facility] OR;  Service: Open Heart Surgery;  Laterality: N/A;   REPLACEMENT ASCENDING AORTA N/A 09/27/2021   Procedure: REPLACEMENT ASCENDING AND INNOMINATE ANEURYSM USING HEMASHIELD PLATINUM [id number];  Surgeon: Loreli Slot, MD;  Location: [facility] OR;  Service: Open Heart Surgery;  Laterality: N/A;   RIGHT HEART CATH AND CORONARY ANGIOGRAPHY N/A 09/07/2021   Procedure: RIGHT HEART CATH AND CORONARY ANGIOGRAPHY;  Surgeon: Orbie Pyo, MD;  Location: [facility] INVASIVE CV LAB;  Service: Cardiovascular;  Laterality: N/A;   TEE WITHOUT CARDIOVERSION N/A 09/27/2021   Procedure: TRANSESOPHAGEAL ECHOCARDIOGRAM (TEE);  Surgeon: Loreli Slot, MD;  Location: [facility] OR;  Service: Open Heart Surgery;  Laterality: N/A;    MEDICATIONS:  acetaminophen (TYLENOL) 500 MG tablet   albuterol (VENTOLIN HFA) 108 (90 Base) MCG/ACT inhaler   aspirin EC 81 MG EC tablet   enoxaparin (LOVENOX) 80 MG/0.8ML injection   ferrous sulfate 325 (65 FE) MG EC tablet   Fiber POWD   loratadine (CLARITIN) 10 MG tablet   metoprolol succinate (TOPROL XL) 25 MG 24 hr tablet   warfarin (COUMADIN) 3 MG tablet   No current facility-administered medications for this encounter.    Jodell Cipro Ward, [name] [facility]Surgical Testing [phone]

## 2022-01-03 ENCOUNTER — Encounter: Payer: Self-pay | Admitting: Thoracic Surgery (Cardiothoracic Vascular Surgery)

## 2022-01-03 NOTE — Anesthesia Preprocedure Evaluation (Addendum)
Anesthesia Evaluation  Patient identified by MRN, date of birth, ID band Patient awake    Reviewed: Allergy & Precautions, NPO status , Patient's Chart, lab work & pertinent test results  Airway Mallampati: II  TM Distance: >3 FB     Dental   Pulmonary asthma , pneumonia,    breath sounds clear to auscultation       Cardiovascular + Valvular Problems/Murmurs AS  Rhythm:Regular Rate:Normal   h/o aortic valve replacement with mechanical valve and repair of the aneurysm on 09/27/2021 Last dose of Warfarin 7/22. Last dose of Lovenox 8am 7/27.   Echo 11/07/2021 1. Mild septal dyskinesis consistent with postoperative state. Left  ventricular ejection fraction, by estimation, is 60 to 65%. Left  ventricular ejection fraction by PLAX is 63 %. The left ventricle has  normal function. The left ventricle has no  regional wall motion abnormalities. There is moderate left ventricular  hypertrophy. Left ventricular diastolic parameters were normal.  2. Right ventricular systolic function is normal. The right ventricular  size is normal. There is normal pulmonary artery systolic pressure.  3. The mitral valve is normal in structure. Trivial mitral valve  regurgitation. No evidence of mitral stenosis.  4. The aortic valve has been repaired/replaced. Aortic valve  regurgitation is not visualized. No aortic stenosis is present. There is a  24 mm On-X mechanical valve present in the aortic position. Procedure  Date: 09/28/2021. Echo findings are consistent  with normal structure and function of the aortic valve prosthesis. Aortic  valve area, by VTI measures 1.43 cm. Aortic valve mean gradient measures  11.8 mmHg. Aortic valve Vmax measures 2.42 m/s.  5. 28 x 10 Hemishield graft repair of the ascending aorta and innominate  artery. Aortic root/ascending aorta has been repaired/replaced. There is  mild dilatation of the aortic root, measuring 41  mm.  6. The inferior vena cava is normal in size with greater than 50%  respiratory variability, suggesting right atrial pressure of 3 mmHg.   Neuro/Psych  Headaches, negative psych ROS   GI/Hepatic negative GI ROS, Neg liver ROS, (+)     substance abuse  alcohol use,   Endo/Other  negative endocrine ROS  Renal/GU negative Renal ROS     Musculoskeletal negative musculoskeletal ROS (+)   Abdominal   Peds  Hematology negative hematology ROS (+) Hb 12.8, plt 328   Anesthesia Other Findings   Reproductive/Obstetrics negative OB ROS                           Anesthesia Physical Anesthesia Plan  ASA: 3  Anesthesia Plan: General and Regional   Post-op Pain Management: Regional block*, Dilaudid IV, Precedex, Toradol IV (intra-op)* and Tylenol PO (pre-op)*   Induction: Intravenous  PONV Risk Score and Plan: 3 and Ondansetron, Dexamethasone, Midazolam and Treatment may vary due to age or medical condition  Airway Management Planned: Oral ETT  Additional Equipment: None  Intra-op Plan:   Post-operative Plan: Extubation in OR  Informed Consent: I have reviewed the patients History and Physical, chart, labs and discussed the procedure including the risks, benefits and alternatives for the proposed anesthesia with the patient or authorized representative who has indicated his/her understanding and acceptance.     Dental advisory given  Plan Discussed with: CRNA and Anesthesiologist  Anesthesia Plan Comments:      Anesthesia Quick Evaluation

## 2022-01-05 ENCOUNTER — Other Ambulatory Visit: Payer: Self-pay

## 2022-01-05 ENCOUNTER — Ambulatory Visit (HOSPITAL_COMMUNITY): Payer: Commercial Managed Care - PPO | Admitting: Physician Assistant

## 2022-01-05 ENCOUNTER — Encounter (HOSPITAL_COMMUNITY)
Admission: RE | Disposition: A | Discharge status: Home / Self Care | Payer: Self-pay | Source: Home / Self Care | Attending: General Surgery

## 2022-01-05 ENCOUNTER — Encounter (HOSPITAL_COMMUNITY): Payer: Self-pay | Admitting: General Surgery

## 2022-01-05 ENCOUNTER — Ambulatory Visit (HOSPITAL_BASED_OUTPATIENT_CLINIC_OR_DEPARTMENT_OTHER): Payer: Commercial Managed Care - PPO | Admitting: Registered Nurse

## 2022-01-05 ENCOUNTER — Ambulatory Visit (HOSPITAL_COMMUNITY)
Admission: RE | Admit: 2022-01-05 | Discharge: 2022-01-05 | Disposition: A | Discharge status: Home / Self Care | Payer: Commercial Managed Care - PPO | Attending: General Surgery | Admitting: General Surgery

## 2022-01-05 DIAGNOSIS — Z7901 Long term (current) use of anticoagulants: Secondary | ICD-10-CM | POA: Diagnosis not present

## 2022-01-05 DIAGNOSIS — J45909 Unspecified asthma, uncomplicated: Secondary | ICD-10-CM

## 2022-01-05 DIAGNOSIS — Z8719 Personal history of other diseases of the digestive system: Secondary | ICD-10-CM | POA: Insufficient documentation

## 2022-01-05 DIAGNOSIS — K4030 Unilateral inguinal hernia, with obstruction, without gangrene, not specified as recurrent: Secondary | ICD-10-CM | POA: Diagnosis present

## 2022-01-05 DIAGNOSIS — J189 Pneumonia, unspecified organism: Secondary | ICD-10-CM | POA: Diagnosis not present

## 2022-01-05 DIAGNOSIS — Z952 Presence of prosthetic heart valve: Secondary | ICD-10-CM | POA: Insufficient documentation

## 2022-01-05 DIAGNOSIS — I350 Nonrheumatic aortic (valve) stenosis: Secondary | ICD-10-CM | POA: Insufficient documentation

## 2022-01-05 HISTORY — PX: INGUINAL HERNIA REPAIR: SHX194

## 2022-01-05 LAB — APTT: aPTT: 29 seconds (ref 24–36)

## 2022-01-05 LAB — PROTIME-INR
INR: 0.9 (ref 0.8–1.2)
Prothrombin Time: 12 seconds (ref 11.4–15.2)

## 2022-01-05 SURGERY — REPAIR, HERNIA, INGUINAL, INCARCERATED
Anesthesia: Regional | Laterality: Right

## 2022-01-05 MED ORDER — ONDANSETRON HCL 4 MG/2ML IJ SOLN: 4.0000 mg | Freq: Once | INTRAMUSCULAR | Status: DC | PRN

## 2022-01-05 MED ORDER — BUPIVACAINE LIPOSOME 1.3 % IJ SUSP
INTRAMUSCULAR | Status: DC | PRN
  Administered 2022-01-05: 10 mL via PERINEURAL

## 2022-01-05 MED ORDER — FENTANYL CITRATE (PF) 100 MCG/2ML IJ SOLN
INTRAMUSCULAR | Status: DC | PRN
  Administered 2022-01-05: 100 ug via INTRAVENOUS
  Administered 2022-01-05 (×2): 50 ug via INTRAVENOUS

## 2022-01-05 MED ORDER — MIDAZOLAM HCL 2 MG/2ML IJ SOLN
INTRAMUSCULAR | Status: AC
Start: 2022-01-05 — End: ?
  Filled 2022-01-05: qty 2

## 2022-01-05 MED ORDER — ONDANSETRON HCL 4 MG/2ML IJ SOLN
INTRAMUSCULAR | Status: DC | PRN
  Administered 2022-01-05: 4 mg via INTRAVENOUS

## 2022-01-05 MED ORDER — KETAMINE HCL 50 MG/5ML IJ SOSY
PREFILLED_SYRINGE | INTRAMUSCULAR | Status: AC
  Filled 2022-01-05: qty 5

## 2022-01-05 MED ORDER — LIDOCAINE 2% (20 MG/ML) 5 ML SYRINGE
INTRAMUSCULAR | Status: DC | PRN
  Administered 2022-01-05: 60 mg via INTRAVENOUS

## 2022-01-05 MED ORDER — ORAL CARE MOUTH RINSE: 15.0000 mL | Freq: Once | OROMUCOSAL | Status: AC

## 2022-01-05 MED ORDER — MIDAZOLAM HCL 2 MG/2ML IJ SOLN
INTRAMUSCULAR | Status: AC
  Filled 2022-01-05: qty 2

## 2022-01-05 MED ORDER — PHENYLEPHRINE HCL-NACL 20-0.9 MG/250ML-% IV SOLN
INTRAVENOUS | Status: DC | PRN
  Administered 2022-01-05: 35 ug/min via INTRAVENOUS

## 2022-01-05 MED ORDER — AMISULPRIDE (ANTIEMETIC) 5 MG/2ML IV SOLN: 10.0000 mg | Freq: Once | INTRAVENOUS | Status: DC | PRN

## 2022-01-05 MED ORDER — DEXAMETHASONE SODIUM PHOSPHATE 10 MG/ML IJ SOLN
INTRAMUSCULAR | Status: AC
  Filled 2022-01-05: qty 1

## 2022-01-05 MED ORDER — FENTANYL CITRATE (PF) 250 MCG/5ML IJ SOLN
INTRAMUSCULAR | Status: AC
  Filled 2022-01-05: qty 5

## 2022-01-05 MED ORDER — 0.9 % SODIUM CHLORIDE (POUR BTL) OPTIME
TOPICAL | Status: DC | PRN
  Administered 2022-01-05: 1000 mL

## 2022-01-05 MED ORDER — BUPIVACAINE-EPINEPHRINE 0.25% -1:200000 IJ SOLN
INTRAMUSCULAR | Status: DC | PRN
  Administered 2022-01-05: 20 mL

## 2022-01-05 MED ORDER — ACETAMINOPHEN 500 MG PO TABS
1000.0000 mg | ORAL_TABLET | ORAL | Status: AC
  Administered 2022-01-05: 1000 mg via ORAL

## 2022-01-05 MED ORDER — SUGAMMADEX SODIUM 200 MG/2ML IV SOLN
INTRAVENOUS | Status: DC | PRN
  Administered 2022-01-05: 200 mg via INTRAVENOUS

## 2022-01-05 MED ORDER — ONDANSETRON HCL 4 MG/2ML IJ SOLN
INTRAMUSCULAR | Status: AC
  Filled 2022-01-05: qty 2

## 2022-01-05 MED ORDER — FENTANYL CITRATE PF 50 MCG/ML IJ SOSY
PREFILLED_SYRINGE | INTRAMUSCULAR | Status: AC
  Filled 2022-01-05: qty 2

## 2022-01-05 MED ORDER — OXYCODONE HCL 5 MG PO TABS
ORAL_TABLET | ORAL | Status: AC
  Filled 2022-01-05: qty 1

## 2022-01-05 MED ORDER — KETAMINE HCL 10 MG/ML IJ SOLN
INTRAMUSCULAR | Status: DC | PRN
  Administered 2022-01-05: 50 mg via INTRAVENOUS

## 2022-01-05 MED ORDER — MIDAZOLAM HCL 5 MG/5ML IJ SOLN
INTRAMUSCULAR | Status: DC | PRN
  Administered 2022-01-05: 2 mg via INTRAVENOUS

## 2022-01-05 MED ORDER — CHLORHEXIDINE GLUCONATE 0.12 % MT SOLN
15.0000 mL | Freq: Once | OROMUCOSAL | Status: AC
  Administered 2022-01-05: 15 mL via OROMUCOSAL

## 2022-01-05 MED ORDER — HYDROMORPHONE HCL 1 MG/ML IJ SOLN
INTRAMUSCULAR | Status: AC
  Filled 2022-01-05: qty 1

## 2022-01-05 MED ORDER — ROPIVACAINE HCL 5 MG/ML IJ SOLN
INTRAMUSCULAR | Status: DC | PRN
  Administered 2022-01-05: 20 mL via PERINEURAL

## 2022-01-05 MED ORDER — CHLORHEXIDINE GLUCONATE CLOTH 2 % EX PADS: 6.0000 | MEDICATED_PAD | Freq: Once | CUTANEOUS | Status: DC

## 2022-01-05 MED ORDER — DEXAMETHASONE SODIUM PHOSPHATE 10 MG/ML IJ SOLN
INTRAMUSCULAR | Status: DC | PRN
  Administered 2022-01-05: 10 mg via INTRAVENOUS

## 2022-01-05 MED ORDER — LIDOCAINE HCL (PF) 2 % IJ SOLN
INTRAMUSCULAR | Status: AC
  Filled 2022-01-05: qty 5

## 2022-01-05 MED ORDER — OXYCODONE HCL 5 MG PO TABS
5.0000 mg | ORAL_TABLET | Freq: Once | ORAL | Status: AC | PRN
  Administered 2022-01-05: 5 mg via ORAL

## 2022-01-05 MED ORDER — PROPOFOL 10 MG/ML IV BOLUS
INTRAVENOUS | Status: DC | PRN
  Administered 2022-01-05: 130 mg via INTRAVENOUS

## 2022-01-05 MED ORDER — ENSURE PRE-SURGERY PO LIQD
296.0000 mL | Freq: Once | ORAL | Status: DC
  Filled 2022-01-05: qty 296

## 2022-01-05 MED ORDER — CEFAZOLIN SODIUM-DEXTROSE 2-4 GM/100ML-% IV SOLN
2.0000 g | INTRAVENOUS | Status: AC
  Administered 2022-01-05: 2 g via INTRAVENOUS

## 2022-01-05 MED ORDER — ACETAMINOPHEN 500 MG PO TABS
1000.0000 mg | ORAL_TABLET | Freq: Once | ORAL | Status: DC
  Filled 2022-01-05: qty 2

## 2022-01-05 MED ORDER — ROCURONIUM BROMIDE 10 MG/ML (PF) SYRINGE
PREFILLED_SYRINGE | INTRAVENOUS | Status: DC | PRN
  Administered 2022-01-05: 20 mg via INTRAVENOUS
  Administered 2022-01-05: 10 mg via INTRAVENOUS
  Administered 2022-01-05: 60 mg via INTRAVENOUS

## 2022-01-05 MED ORDER — LACTATED RINGERS IV SOLN: INTRAVENOUS | Status: DC

## 2022-01-05 MED ORDER — ROCURONIUM BROMIDE 10 MG/ML (PF) SYRINGE
PREFILLED_SYRINGE | INTRAVENOUS | Status: AC
  Filled 2022-01-05: qty 10

## 2022-01-05 MED ORDER — ACETAMINOPHEN 500 MG PO TABS: 1000.0000 mg | ORAL_TABLET | Freq: Three times a day (TID) | ORAL | 0 refills | Status: AC

## 2022-01-05 MED ORDER — HYDROMORPHONE HCL 1 MG/ML IJ SOLN
0.2500 mg | INTRAMUSCULAR | Status: DC | PRN
  Administered 2022-01-05 (×2): 0.5 mg via INTRAVENOUS

## 2022-01-05 MED ORDER — OXYCODONE HCL 5 MG/5ML PO SOLN: 5.0000 mg | Freq: Once | ORAL | Status: AC | PRN

## 2022-01-05 MED ORDER — CEFAZOLIN SODIUM-DEXTROSE 2-4 GM/100ML-% IV SOLN
INTRAVENOUS | Status: AC
  Filled 2022-01-05: qty 100

## 2022-01-05 MED ORDER — BUPIVACAINE LIPOSOME 1.3 % IJ SUSP
INTRAMUSCULAR | Status: AC
  Filled 2022-01-05: qty 10

## 2022-01-05 MED ORDER — HEPARIN SODIUM (PORCINE) 5000 UNIT/ML IJ SOLN
INTRAMUSCULAR | Status: AC
  Filled 2022-01-05: qty 1

## 2022-01-05 MED ORDER — OXYCODONE HCL 5 MG PO TABS: 5.0000 mg | ORAL_TABLET | Freq: Four times a day (QID) | ORAL | 0 refills | Status: DC | PRN

## 2022-01-05 MED ORDER — BUPIVACAINE-EPINEPHRINE (PF) 0.25% -1:200000 IJ SOLN
INTRAMUSCULAR | Status: AC
  Filled 2022-01-05: qty 30

## 2022-01-05 MED ORDER — PROPOFOL 10 MG/ML IV BOLUS
INTRAVENOUS | Status: AC
  Filled 2022-01-05: qty 20

## 2022-01-05 MED ORDER — HEPARIN SODIUM (PORCINE) 5000 UNIT/ML IJ SOLN
5000.0000 [IU] | Freq: Once | INTRAMUSCULAR | Status: AC
  Administered 2022-01-05: 5000 [IU] via SUBCUTANEOUS

## 2022-01-05 MED ORDER — FENTANYL CITRATE (PF) 100 MCG/2ML IJ SOLN
INTRAMUSCULAR | Status: AC
  Filled 2022-01-05: qty 2

## 2022-01-05 SURGICAL SUPPLY — 33 items
BAG COUNTER SPONGE SURGICOUNT (BAG) ×1 IMPLANT
BENZOIN TINCTURE PRP APPL 2/3 (GAUZE/BANDAGES/DRESSINGS) IMPLANT
COVER SURGICAL LIGHT HANDLE (MISCELLANEOUS) ×2 IMPLANT
DERMABOND ADVANCED (GAUZE/BANDAGES/DRESSINGS) ×2
DERMABOND ADVANCED .7 DNX12 (GAUZE/BANDAGES/DRESSINGS) IMPLANT
DRAIN PENROSE 0.5X18 (DRAIN) ×1 IMPLANT
DRAPE LAPAROTOMY T 98X78 PEDS (DRAPES) ×2 IMPLANT
DRSG TEGADERM 4X4.75 (GAUZE/BANDAGES/DRESSINGS) IMPLANT
ELECT REM PT RETURN 15FT ADLT (MISCELLANEOUS) ×2 IMPLANT
GLOVE BIO SURGEON STRL SZ7.5 (GLOVE) ×2 IMPLANT
GLOVE BIOGEL PI IND STRL 8 (GLOVE) ×1 IMPLANT
GLOVE BIOGEL PI INDICATOR 8 (GLOVE) ×1
GLOVE BIOGEL PI ORTHO PRO 7.5 (GLOVE) ×1
GLOVE PI ORTHO PRO STRL 7.5 (GLOVE) ×1 IMPLANT
GOWN STRL REUS W/ TWL XL LVL3 (GOWN DISPOSABLE) ×2 IMPLANT
GOWN STRL REUS W/TWL XL LVL3 (GOWN DISPOSABLE) ×4
HEMOSTAT ARISTA ABSORB 3G PWDR (HEMOSTASIS) ×1 IMPLANT
KIT BASIN OR (CUSTOM PROCEDURE TRAY) ×2 IMPLANT
KIT TURNOVER KIT A (KITS) ×1 IMPLANT
MARKER SKIN DUAL TIP RULER LAB (MISCELLANEOUS) ×2 IMPLANT
MESH BARD SOFT 3X6IN (Mesh General) ×1 IMPLANT
NEEDLE HYPO 22GX1.5 SAFETY (NEEDLE) ×2 IMPLANT
PACK GENERAL/GYN (CUSTOM PROCEDURE TRAY) ×2 IMPLANT
SPIKE FLUID TRANSFER (MISCELLANEOUS) IMPLANT
STRIP CLOSURE SKIN 1/2X4 (GAUZE/BANDAGES/DRESSINGS) ×1 IMPLANT
SUT MNCRL AB 4-0 PS2 18 (SUTURE) ×2 IMPLANT
SUT PROLENE 2 0 CT2 30 (SUTURE) ×4 IMPLANT
SUT VIC AB 2-0 SH 27 (SUTURE) ×2
SUT VIC AB 2-0 SH 27X BRD (SUTURE) ×1 IMPLANT
SUT VIC AB 3-0 SH 18 (SUTURE) ×3 IMPLANT
SYR 20ML LL LF (SYRINGE) ×2 IMPLANT
TOWEL OR 17X26 10 PK STRL BLUE (TOWEL DISPOSABLE) ×2 IMPLANT
TOWEL OR NON WOVEN STRL DISP B (DISPOSABLE) ×2 IMPLANT

## 2022-01-05 NOTE — Op Note (Signed)
Open Right Incarcerated Indirect Inguinal Hernia Repair with Mesh Procedure Note  Indications: The patient presented with a history of a right, not reducible hernia.    Pre-operative Diagnosis: right not reducible inguinal hernia  Post-operative Diagnosis: right indirect incarcerated (omentum) inguinal hernia + sports hernia (tear in external oblique aponeurosis)  Surgeon: Gaynelle Adu MD FACS  Surgery Resident: Zena Amos PGY-3  Anesthesia: General endotracheal anesthesia + TAP block  Procedure Details  The patient was seen again in the Holding Room. The risks, benefits, complications, treatment options, and expected outcomes were discussed with the patient. The possibilities of reaction to medication, pulmonary aspiration, perforation of viscus, bleeding, recurrent infection, the need for additional procedures, and development of a complication requiring transfusion or further operation were discussed with the patient and/or family. The likelihood of success in repairing the hernia and returning the patient to their previous functional status is good.  There was concurrence with the proposed plan, and informed consent was obtained. The site of surgery was properly noted/marked. The patient was taken to the Operating Room, identified as [name] [name], and the procedure verified as right inguinal hernia repair. A Time Out was held and the above information confirmed. He received a TAP block by anesthesia. I also gave him 5000 units subcu heparin. He was on a perioperative lovenox/coumadin bridge for his Aortic valve replacement.   The patient was placed in the supine position and underwent induction of anesthesia. The lower abdomen and groin was prepped with Chloraprep and draped in the standard fashion, and 0.25% Marcaine with epinephrine was used to anesthetize the skin over the mid-portion of the inguinal canal. An oblique incision was made. Dissection was carried down  through the subcutaneous tissue with cautery to the external oblique fascia.  The patient had a tear in his external oblique aponeurosis consistent with athletic pubalgia, sports hernia.  We opened the external oblique fascia along the direction of its fibers to the external ring.  The spermatic cord was circumferentially dissected bluntly and retracted with a Penrose drain.  The floor of the inguinal canal was inspected and there was no defect in the floor consistent with an direct hernia.  We skeletonized the spermatic cord and isolated a very large indirect hernia sac that was full of omentum.  There was also a separate cord lipoma which was ligated.  We were able to reduce the hernia sac containing omentum back into the abdominal cavity..  We used a 3 x 6 inch piece of Bard soft mesh, which was cut into a keyhole shape.  This was secured with 2-0 Prolene, beginning at the pubic tubercle, running this along the shelving edge inferiorly. Superiorly, the mesh was secured to the internal oblique fascia with interrupted 2-0 Prolene sutures.  The tails of the mesh were sutured together behind the spermatic cord.  The mesh was tucked underneath the external oblique fascia laterally.  We ensured that the testicle which had been delivered into the operative field was back down into the scrotum and not twisted.  The cavity was irrigated with saline.  No evidence of bleeding.  Arista powder was placed in the inguinal space since the patient is on perioperative blood thinners.  The external oblique fascia was reapproximated with 2-0 Vicryl.  3-0 Vicryl was used to close the subcutaneous tissues and 4-0 Monocryl was used to close the skin in subcuticular fashion.  Dermabond was used to seal the incision.   The patient was then extubated and brought to the  recovery room in stable condition.  All sponge, instrument, and needle counts were correct prior to closure and at the conclusion of the case.   Estimated Blood Loss:   20cc                 Complications: None; patient tolerated the procedure well.         Disposition: PACU - hemodynamically stable.         Condition: stable  Mary Sella. Andrey Campanile, MD, FACS General, Bariatric, & Minimally Invasive Surgery [facility], [place]

## 2022-01-05 NOTE — Discharge Instructions (Addendum)
FOLLOW YOUR COUMADIN & LOVENOX INSTRUCTION RESUME LOVENOX SATURDAY NIGHT [facility]  UMBILICAL OR INGUINAL HERNIA REPAIR: POST OP INSTRUCTIONS  Always review your discharge instruction sheet given to you by the facility where your surgery was performed. IF YOU HAVE DISABILITY OR FAMILY LEAVE FORMS, YOU MUST BRING THEM TO THE OFFICE FOR PROCESSING.   DO NOT GIVE THEM TO YOUR DOCTOR.  A  prescription for pain medication may be given to you upon discharge.  Take your pain medication as prescribed, if needed.  If narcotic pain medicine is not needed, then you may take acetaminophen (Tylenol) or ibuprofen (Advil) as needed. Take your usually prescribed medications unless otherwise directed. If you need a refill on your pain medication, please contact your pharmacy.  They will contact our office to request authorization. Prescriptions will not be filled after 5 pm or on week-ends. You should follow a light diet the first 24 hours after arrival home, such as soup and crackers, etc.  Be sure to include lots of fluids daily.  Resume your normal diet the day after surgery. Most patients will experience some swelling and bruising around the umbilicus or in the groin and scrotum.  Ice packs and reclining will help.  Swelling and bruising can take several days to resolve.  It is common to experience some constipation if taking pain medication after surgery.  Increasing fluid intake and taking a stool softener (such as Colace) will usually help or prevent this problem from occurring.  A mild laxative (Milk of Magnesia or Miralax) should be taken according to package directions if there are no bowel movements after 48 hours. Unless discharge instructions indicate otherwise, you may remove your bandages 24-48 hours after surgery, and you may shower at that time.  You may have steri-strips (small skin tapes) in place directly over the incision.  These strips should be left on the skin for 7-10  days.  If your surgeon used skin glue on the incision, you may shower in 24 hours.  The glue will flake off over the next 2-3 weeks.  Any sutures or staples will be removed at the office during your follow-up visit. ACTIVITIES:  You may resume regular (light) daily activities beginning the next day--such as daily self-care, walking, climbing stairs--gradually increasing activities as tolerated.  You may have sexual intercourse when it is comfortable.  Refrain from any heavy lifting or straining until approved by your doctor. You may drive when you are no longer taking prescription pain medication, you can comfortably wear a seatbelt, and you can safely maneuver your car and apply brakes. RETURN TO WORK:  You should see your doctor in the office for a follow-up appointment approximately 2-3 weeks after your surgery.  Make sure that you call for this appointment within a day or two after you arrive home to insure a convenient appointment time. OTHER INSTRUCTIONS:     WHEN TO CALL YOUR DOCTOR: Fever over 101.0 Inability to urinate Nausea and/or vomiting Extreme swelling or bruising Continued bleeding from incision. Increased pain, redness, or drainage from the incision  The clinic staff is available to answer your questions during regular business hours.  Please don't hesitate to call and ask to speak to one of the nurses for clinical concerns.  If you have a medical emergency, go to the nearest emergency room or call 911.  A surgeon from [facility] is always on call at the hospital   [address], [address], [place], [place]  [address] ?  [address] [address], [place], [place]   [address] [phone] ? [phone] ? FAX [phone] Web site: [email or web address]  

## 2022-01-05 NOTE — H&P (Signed)
CC: here for hernia surgery  Requesting provider: n/a  HPI: [name] [name] is an 31 y.o. male who is here for open repair of right incarcerated inguinal hernia with mesh.  I initially met him back in the office several months ago but during his cardiac clearance and evaluation he was found to have worsening aortic stenosis and underwent aortic valve replacement on April 19.  He was placed on Coumadin postprocedure.  We had to wait several months for him to to be able to be bridged with Lovenox and lieu of IV heparin.  He states other than his cardiac surgery he has not had any medical changes.  He denies any chest pain, chest pressure, shortness of breath, dyspnea on exertion, orthopnea.  No abdominal pain.  No fever or chills.  Urinating and having normal bowel movements okay.  He states that he stopped his Coumadin on July 22 and has been on a Lovenox bridge.  He last took 80 mg of Lovenox yesterday morning at around 9 AM.  When I met him in March she had had a bulge in his right groin for quite some time which has become more symptomatic.  He was having discomfort with walking.  It would be more pronounced by the end of the day.  Past Medical History:  Diagnosis Date   Asthma    Headache    migraines   Heart murmur    Mild aortic stenosis    Pancreatitis    Pneumonia    as a child x several   Substance abuse [facility])     Past Surgical History:  Procedure Laterality Date   AORTIC VALVE REPLACEMENT N/A 09/27/2021   Procedure: AORTIC VALVE REPLACEMENT (AVR) USING ON-X AORTIC VALVE SIZE 23MM;  Surgeon: [name], MD;  Location: [place];  Service: Open Heart Surgery;  Laterality: N/A;   REPLACEMENT ASCENDING AORTA N/A 09/27/2021   Procedure: REPLACEMENT ASCENDING AND INNOMINATE ANEURYSM USING HEMASHIELD PLATINUM [id number];  Surgeon: [name], MD;  Location: [place];  Service: Open Heart Surgery;  Laterality: N/A;   RIGHT HEART CATH AND CORONARY  ANGIOGRAPHY N/A 09/07/2021   Procedure: RIGHT HEART CATH AND CORONARY ANGIOGRAPHY;  Surgeon: Early Osmond, MD;  Location: [place] [facility];  Service: Cardiovascular;  Laterality: N/A;   TEE WITHOUT CARDIOVERSION N/A 09/27/2021   Procedure: TRANSESOPHAGEAL ECHOCARDIOGRAM (TEE);  Surgeon: [name], MD;  Location: [place];  Service: Open Heart Surgery;  Laterality: N/A;    Family History  Problem Relation Age of Onset   Pancreatitis Maternal Uncle     Social:  reports that he has never smoked. He has never used smokeless tobacco. He reports current alcohol use. He reports that he does not currently use drugs after having used the following drugs: Other-see comments.  Allergies:  Allergies  Allergen Reactions   Amoxicillin Hives    Medications: I have reviewed the patient's current medications.   ROS - all of the below systems have been reviewed with the patient and positives are indicated with bold text General: chills, fever or night sweats Eyes: blurry vision or double vision ENT: epistaxis or sore throat Allergy/Immunology: itchy/watery eyes or nasal congestion Hematologic/Lymphatic: bleeding problems, blood clots or swollen lymph nodes Endocrine: temperature intolerance or unexpected weight changes Breast: new or changing breast lumps or nipple discharge Resp: cough, shortness of breath, or wheezing CV: chest pain or dyspnea on exertion GI: as per HPI GU: dysuria, trouble voiding, or hematuria MSK: joint pain  or joint stiffness Neuro: TIA or stroke symptoms Derm: pruritus and skin lesion changes Psych: anxiety and depression  PE Blood pressure (!) 142/90, pulse 78, temperature 98.1 F (36.7 C), temperature source Oral, resp. rate 15, height $RemoveBe'5\' 11"'YgavcQkdk$  (1.803 m), weight 89.4 kg, SpO2 100 %. Constitutional: NAD; conversant; no deformities Eyes: Moist conjunctiva; no lid lag; anicteric; PERRL Neck: Trachea midline; no thyromegaly Lungs: Normal respiratory  effort; no tactile fremitus CV: RRR; no palpable thrills; no pitting edema; sternotomy incision GI: Abd soft, nontender, nondistended; no palpable hepatosplenomegaly; bulge in right groin, softer than when I saw him in the clinic back in March MSK: Normal gait; no clubbing/cyanosis Psychiatric: Appropriate affect; alert and oriented x3 Lymphatic: No palpable cervical or axillary lymphadenopathy Skin: No rash, lesions or  No results found for this or any previous visit (from the past 48 hour(s)).  No results found.  Imaging: Reviewed  A/P: [name] [name] is an 31 y.o. male with symptomatic incarcerated right inguinal hernia Status post aortic valve replacement with repair of aortic aneurysm April 2023 Chronically anticoagulated on Coumadin-currently being bridged with Lovenox Remote history of pancreatitis  2 OR for open repair of right inguinal hernia with mesh IV antibiotic Enhanced recovery protocol Preoperative subcutaneous heparin Tap block by anesthesia Patient has postoperative Coumadin Lovenox schedule and follow-up scheduled with Coumadin clinic  Leighton Ruff. Redmond Pulling, MD, FACS General, Bariatric, & Minimally Invasive Surgery [facility], [place]

## 2022-01-05 NOTE — Anesthesia Procedure Notes (Signed)
Anesthesia Regional Block: TAP block   Pre-Anesthetic Checklist: , timeout performed,  Correct Patient, Correct Site, Correct Laterality,  Correct Procedure, Correct Position, site marked,  Risks and benefits discussed,  Surgical consent,  Pre-op evaluation,  At surgeon's request and post-op pain management  Laterality: Right  Prep: Maximum Sterile Barrier Precautions used, chloraprep       Needles:  Injection technique: Single-shot  Needle Type: Echogenic Stimulator Needle     Needle Length: 9cm  Needle Gauge: 22     Additional Needles:   Procedures:,,,, ultrasound used (permanent image in chart),,    Narrative:  Start time: 01/05/2022 10:55 AM End time: 01/05/2022 10:59 AM Injection made incrementally with aspirations every 5 mL.  Performed by: Personally  Anesthesiologist: Lannie Fields, DO  Additional Notes: Monitors applied. No increased pain on injection. No increased resistance to injection. Injection made in 5cc increments. Good needle visualization. Patient tolerated procedure well.

## 2022-01-05 NOTE — Anesthesia Procedure Notes (Signed)
Procedure Name: Intubation Date/Time: 01/05/2022 10:57 AM  Performed by: Maxwell Caul, CRNAPre-anesthesia Checklist: Patient identified, Emergency Drugs available, Suction available and Patient being monitored Patient Re-evaluated:Patient Re-evaluated prior to induction Oxygen Delivery Method: Circle system utilized Preoxygenation: Pre-oxygenation with 100% oxygen Induction Type: IV induction Ventilation: Mask ventilation without difficulty Laryngoscope Size: Mac and 4 Grade View: Grade I Tube type: Oral Tube size: 7.5 mm Number of attempts: 1 Airway Equipment and Method: Stylet Placement Confirmation: ETT inserted through vocal cords under direct vision, positive ETCO2 and breath sounds checked- equal and bilateral Secured at: 22 cm Tube secured with: Tape Dental Injury: Teeth and Oropharynx as per pre-operative assessment

## 2022-01-05 NOTE — Transfer of Care (Signed)
Immediate Anesthesia Transfer of Care Note  Patient: [name] [name]  Procedure(s) Performed: OPEN REPAIR INCARCERATED RIGHT INGUINAL HERNIA WITH MESH (Right)  Patient Location: PACU  Anesthesia Type:General  Level of Consciousness: awake, alert  and oriented  Airway & Oxygen Therapy: Patient Spontanous Breathing and Patient connected to face mask oxygen  Post-op Assessment: Report given to RN and Post -op Vital signs reviewed and stable  Post vital signs: Reviewed and stable  Last Vitals:  Vitals Value Taken Time  BP 148/89 01/05/22 1308  Temp    Pulse 93 01/05/22 1309  Resp 16 01/05/22 1309  SpO2 100 % 01/05/22 1309  Vitals shown include unvalidated device data.  Last Pain:  Vitals:   01/05/22 1031  TempSrc: Oral  PainSc:          Complications: No notable events documented.

## 2022-01-05 NOTE — Anesthesia Postprocedure Evaluation (Signed)
Anesthesia Post Note  Patient: [name] [name]  Procedure(s) Performed: OPEN REPAIR INCARCERATED RIGHT INGUINAL HERNIA WITH MESH (Right)     Patient location during evaluation: PACU Anesthesia Type: Regional and General Level of consciousness: awake and alert Pain management: pain level controlled Vital Signs Assessment: post-procedure vital signs reviewed and stable Respiratory status: spontaneous breathing, nonlabored ventilation, respiratory function stable and patient connected to nasal cannula oxygen Cardiovascular status: blood pressure returned to baseline and stable Postop Assessment: no apparent nausea or vomiting Anesthetic complications: no   No notable events documented.  Last Vitals:  Vitals:   01/05/22 1415 01/05/22 1424  BP: (!) 143/87 132/87  Pulse: 63 60  Resp: 12 14  Temp:    SpO2: 97% 97%    Last Pain:  Vitals:   01/05/22 1415  TempSrc:   PainSc: 4                  Dareth Andrew P Altair Stanko

## 2022-01-08 ENCOUNTER — Encounter (HOSPITAL_COMMUNITY): Payer: Self-pay | Admitting: General Surgery

## 2022-01-09 ENCOUNTER — Ambulatory Visit: Payer: Commercial Managed Care - PPO | Admitting: Thoracic Surgery (Cardiothoracic Vascular Surgery)

## 2022-01-11 ENCOUNTER — Ambulatory Visit (INDEPENDENT_AMBULATORY_CARE_PROVIDER_SITE_OTHER): Payer: Commercial Managed Care - PPO

## 2022-01-11 DIAGNOSIS — Z952 Presence of prosthetic heart valve: Secondary | ICD-10-CM

## 2022-01-11 DIAGNOSIS — Z7901 Long term (current) use of anticoagulants: Secondary | ICD-10-CM

## 2022-01-11 LAB — POCT INR: INR: 1.2 — AB (ref 2.0–3.0)

## 2022-01-11 NOTE — Patient Instructions (Signed)
TAKE 2 TABLETS TODAY AND 3 TABLETS FRIDAY ONLY and then Continue taking warfarin 1.5 tablets daily except 2 tablets on Fridays.  Start Lovenox Injections today, missed post procedure, unclear on directions. Stay consistent with greens each week. Recheck INR 1 week post procedure. (*INR Goal 2-3 for 3 months, then decrease to 1.5-2.0-SURGERY DATE WAS 09/27/2021*). Surgical Clearance Fax number [phone] Main [phone]

## 2022-01-17 ENCOUNTER — Ambulatory Visit (INDEPENDENT_AMBULATORY_CARE_PROVIDER_SITE_OTHER): Payer: Commercial Managed Care - PPO | Admitting: *Deleted

## 2022-01-17 DIAGNOSIS — Z952 Presence of prosthetic heart valve: Secondary | ICD-10-CM

## 2022-01-17 DIAGNOSIS — Z7901 Long term (current) use of anticoagulants: Secondary | ICD-10-CM | POA: Diagnosis not present

## 2022-01-17 LAB — POCT INR: INR: 1.1 — AB (ref 2.0–3.0)

## 2022-01-17 NOTE — Patient Instructions (Addendum)
Description   TAKE 3 TABLETS TODAY AND 3 TABLETS TOMORROW and then start taking warfarin 1.5 tablets daily except 2 tablets on Mondays, Wednesday, and Fridays. Continue Lovenox Injections. Stay consistent with greens each week. Recheck INR 6 days. (*INR Goal 2-3 for 3 months, then decrease to 1.5-2.0-SURGERY DATE WAS 09/27/2021*). Surgical Clearance Fax number [phone] Main [phone]

## 2022-01-22 ENCOUNTER — Other Ambulatory Visit: Payer: Self-pay

## 2022-01-22 ENCOUNTER — Observation Stay (HOSPITAL_COMMUNITY)
Admission: EM | Admit: 2022-01-22 | Discharge: 2022-01-25 | Disposition: A | Discharge status: Home / Self Care | Payer: Commercial Managed Care - PPO | Attending: Family Medicine | Admitting: Family Medicine

## 2022-01-22 ENCOUNTER — Encounter (HOSPITAL_COMMUNITY): Payer: Self-pay | Admitting: *Deleted

## 2022-01-22 DIAGNOSIS — R791 Abnormal coagulation profile: Secondary | ICD-10-CM | POA: Diagnosis present

## 2022-01-22 DIAGNOSIS — J45909 Unspecified asthma, uncomplicated: Secondary | ICD-10-CM | POA: Insufficient documentation

## 2022-01-22 DIAGNOSIS — K760 Fatty (change of) liver, not elsewhere classified: Secondary | ICD-10-CM | POA: Insufficient documentation

## 2022-01-22 DIAGNOSIS — K861 Other chronic pancreatitis: Secondary | ICD-10-CM | POA: Diagnosis present

## 2022-01-22 DIAGNOSIS — Z7901 Long term (current) use of anticoagulants: Secondary | ICD-10-CM | POA: Insufficient documentation

## 2022-01-22 DIAGNOSIS — N433 Hydrocele, unspecified: Secondary | ICD-10-CM | POA: Diagnosis not present

## 2022-01-22 DIAGNOSIS — F1010 Alcohol abuse, uncomplicated: Secondary | ICD-10-CM | POA: Diagnosis not present

## 2022-01-22 DIAGNOSIS — Z7982 Long term (current) use of aspirin: Secondary | ICD-10-CM | POA: Diagnosis not present

## 2022-01-22 DIAGNOSIS — K4020 Bilateral inguinal hernia, without obstruction or gangrene, not specified as recurrent: Secondary | ICD-10-CM | POA: Insufficient documentation

## 2022-01-22 DIAGNOSIS — R7401 Elevation of levels of liver transaminase levels: Secondary | ICD-10-CM | POA: Diagnosis not present

## 2022-01-22 DIAGNOSIS — R1032 Left lower quadrant pain: Secondary | ICD-10-CM | POA: Diagnosis not present

## 2022-01-22 DIAGNOSIS — Z8701 Personal history of pneumonia (recurrent): Secondary | ICD-10-CM

## 2022-01-22 DIAGNOSIS — K2980 Duodenitis without bleeding: Secondary | ICD-10-CM | POA: Diagnosis present

## 2022-01-22 DIAGNOSIS — Z952 Presence of prosthetic heart valve: Secondary | ICD-10-CM

## 2022-01-22 DIAGNOSIS — G43909 Migraine, unspecified, not intractable, without status migrainosus: Secondary | ICD-10-CM | POA: Diagnosis present

## 2022-01-22 DIAGNOSIS — R971 Elevated cancer antigen 125 [CA 125]: Secondary | ICD-10-CM | POA: Insufficient documentation

## 2022-01-22 DIAGNOSIS — I10 Essential (primary) hypertension: Secondary | ICD-10-CM | POA: Diagnosis not present

## 2022-01-22 DIAGNOSIS — Z880 Allergy status to penicillin: Secondary | ICD-10-CM

## 2022-01-22 DIAGNOSIS — R935 Abnormal findings on diagnostic imaging of other abdominal regions, including retroperitoneum: Secondary | ICD-10-CM | POA: Diagnosis present

## 2022-01-22 DIAGNOSIS — K859 Acute pancreatitis without necrosis or infection, unspecified: Secondary | ICD-10-CM | POA: Diagnosis present

## 2022-01-22 DIAGNOSIS — Z79899 Other long term (current) drug therapy: Secondary | ICD-10-CM | POA: Insufficient documentation

## 2022-01-22 DIAGNOSIS — D72829 Elevated white blood cell count, unspecified: Secondary | ICD-10-CM | POA: Diagnosis present

## 2022-01-22 LAB — CBC WITH DIFFERENTIAL/PLATELET
Abs Immature Granulocytes: 0.07 10*3/uL (ref 0.00–0.07)
Basophils Absolute: 0 10*3/uL (ref 0.0–0.1)
Basophils Relative: 0 %
Eosinophils Absolute: 0.3 10*3/uL (ref 0.0–0.5)
Eosinophils Relative: 2 %
HCT: 42.9 % (ref 39.0–52.0)
Hemoglobin: 13.8 g/dL (ref 13.0–17.0)
Immature Granulocytes: 0 %
Lymphocytes Relative: 13 %
Lymphs Abs: 2.3 10*3/uL (ref 0.7–4.0)
MCH: 27.9 pg (ref 26.0–34.0)
MCHC: 32.2 g/dL (ref 30.0–36.0)
MCV: 86.8 fL (ref 80.0–100.0)
Monocytes Absolute: 1.4 10*3/uL — ABNORMAL HIGH (ref 0.1–1.0)
Monocytes Relative: 8 %
Neutro Abs: 13.2 10*3/uL — ABNORMAL HIGH (ref 1.7–7.7)
Neutrophils Relative %: 77 %
Platelets: 361 10*3/uL (ref 150–400)
RBC: 4.94 MIL/uL (ref 4.22–5.81)
RDW: 14.4 % (ref 11.5–15.5)
WBC: 17.3 10*3/uL — ABNORMAL HIGH (ref 4.0–10.5)
nRBC: 0 % (ref 0.0–0.2)

## 2022-01-22 LAB — PROTIME-INR
INR: 1.9 — ABNORMAL HIGH (ref 0.8–1.2)
Prothrombin Time: 21.4 seconds — ABNORMAL HIGH (ref 11.4–15.2)

## 2022-01-22 MED ORDER — LACTATED RINGERS IV BOLUS
1000.0000 mL | Freq: Once | INTRAVENOUS | Status: AC
  Administered 2022-01-22: 1000 mL via INTRAVENOUS

## 2022-01-22 MED ORDER — ONDANSETRON 4 MG PO TBDP
4.0000 mg | ORAL_TABLET | Freq: Once | ORAL | Status: AC
  Administered 2022-01-22: 4 mg via ORAL
  Filled 2022-01-22: qty 1

## 2022-01-22 NOTE — ED Provider Triage Note (Signed)
  Emergency Medicine Provider Triage Evaluation Note  MRN:  [id number]  Arrival date & time: 01/23/22    Medically screening exam initiated at 6:28 AM.   CC:   Abdominal Pain  HPI:  [name] [name] is a 31 y.o. year-old male presents to the ED with chief complaint of abdominal pain, nausea and diarrhea.  States that he and his partner have similar symptoms.  Onset this afternoon.  Notable history of recent aortic valve, on coumadin.  History provided by patient. ROS:  -As included in HPI PE:   Vitals:   01/23/22 0127 01/23/22 0526  BP: (!) 145/98 (!) 148/91  Pulse: 64 82  Resp: 16 16  Temp:  97.9 F (36.6 C)  SpO2: 98% 98%    Clammy, green Non-toxic appearing No respiratory distress  MDM:  Based on signs and symptoms, food poisoning is highest on my differential, followed by viral gastro. I've ordered labs and meds in triage to expedite lab/diagnostic workup.  Patient was informed that the remainder of the evaluation will be completed by another provider, this initial triage assessment does not replace that evaluation, and the importance of remaining in the ED until their evaluation is complete.    Roxy Horseman, [name] 01/23/22 [phone]

## 2022-01-22 NOTE — ED Triage Notes (Signed)
Pt with onset of abdominal pain after eating around 4pm, pain started become worse with nausea and diarrhea later in the evening.

## 2022-01-22 NOTE — ED Triage Notes (Signed)
Pt arrives via [facility]. Per report, the pt has been traveling and his partner started experiencing gi symptoms after eating french fries. The pt has had diarrhea and nausea. En route vitals \\150 /90, hr 90, dbg 162.

## 2022-01-22 NOTE — ED Notes (Signed)
Pt is pale, clammy, says he thinks he is going to pass out.

## 2022-01-23 ENCOUNTER — Emergency Department (HOSPITAL_COMMUNITY): Payer: Commercial Managed Care - PPO

## 2022-01-23 ENCOUNTER — Encounter (HOSPITAL_COMMUNITY): Payer: Self-pay | Admitting: Internal Medicine

## 2022-01-23 DIAGNOSIS — D72829 Elevated white blood cell count, unspecified: Secondary | ICD-10-CM | POA: Diagnosis not present

## 2022-01-23 DIAGNOSIS — Z7982 Long term (current) use of aspirin: Secondary | ICD-10-CM | POA: Diagnosis not present

## 2022-01-23 DIAGNOSIS — Z7901 Long term (current) use of anticoagulants: Secondary | ICD-10-CM | POA: Diagnosis not present

## 2022-01-23 DIAGNOSIS — I10 Essential (primary) hypertension: Secondary | ICD-10-CM | POA: Diagnosis not present

## 2022-01-23 DIAGNOSIS — Z880 Allergy status to penicillin: Secondary | ICD-10-CM | POA: Diagnosis not present

## 2022-01-23 DIAGNOSIS — R791 Abnormal coagulation profile: Secondary | ICD-10-CM | POA: Diagnosis present

## 2022-01-23 DIAGNOSIS — K861 Other chronic pancreatitis: Secondary | ICD-10-CM | POA: Diagnosis present

## 2022-01-23 DIAGNOSIS — N433 Hydrocele, unspecified: Secondary | ICD-10-CM | POA: Diagnosis present

## 2022-01-23 DIAGNOSIS — J45909 Unspecified asthma, uncomplicated: Secondary | ICD-10-CM | POA: Diagnosis present

## 2022-01-23 DIAGNOSIS — R935 Abnormal findings on diagnostic imaging of other abdominal regions, including retroperitoneum: Secondary | ICD-10-CM | POA: Diagnosis present

## 2022-01-23 DIAGNOSIS — Z8701 Personal history of pneumonia (recurrent): Secondary | ICD-10-CM | POA: Diagnosis not present

## 2022-01-23 DIAGNOSIS — K760 Fatty (change of) liver, not elsewhere classified: Secondary | ICD-10-CM | POA: Diagnosis present

## 2022-01-23 DIAGNOSIS — R1032 Left lower quadrant pain: Secondary | ICD-10-CM | POA: Diagnosis present

## 2022-01-23 DIAGNOSIS — K4020 Bilateral inguinal hernia, without obstruction or gangrene, not specified as recurrent: Secondary | ICD-10-CM | POA: Diagnosis present

## 2022-01-23 DIAGNOSIS — K859 Acute pancreatitis without necrosis or infection, unspecified: Secondary | ICD-10-CM | POA: Diagnosis not present

## 2022-01-23 DIAGNOSIS — F1010 Alcohol abuse, uncomplicated: Secondary | ICD-10-CM | POA: Diagnosis present

## 2022-01-23 DIAGNOSIS — G43909 Migraine, unspecified, not intractable, without status migrainosus: Secondary | ICD-10-CM | POA: Diagnosis present

## 2022-01-23 DIAGNOSIS — Z79899 Other long term (current) drug therapy: Secondary | ICD-10-CM | POA: Diagnosis not present

## 2022-01-23 DIAGNOSIS — Z952 Presence of prosthetic heart valve: Secondary | ICD-10-CM

## 2022-01-23 DIAGNOSIS — R7401 Elevation of levels of liver transaminase levels: Secondary | ICD-10-CM | POA: Diagnosis present

## 2022-01-23 DIAGNOSIS — K2980 Duodenitis without bleeding: Secondary | ICD-10-CM | POA: Diagnosis present

## 2022-01-23 HISTORY — DX: Elevated white blood cell count, unspecified: D72.829

## 2022-01-23 HISTORY — DX: Abnormal coagulation profile: R79.1

## 2022-01-23 LAB — COMPREHENSIVE METABOLIC PANEL
ALT: 22 U/L (ref 0–44)
AST: 27 U/L (ref 15–41)
Albumin: 4.5 g/dL (ref 3.5–5.0)
Alkaline Phosphatase: 65 U/L (ref 38–126)
Anion gap: 13 (ref 5–15)
BUN: 14 mg/dL (ref 6–20)
CO2: 22 mmol/L (ref 22–32)
Calcium: 9.3 mg/dL (ref 8.9–10.3)
Chloride: 105 mmol/L (ref 98–111)
Creatinine, Ser: 0.92 mg/dL (ref 0.61–1.24)
GFR, Estimated: >60 mL/min (ref >60)
Glucose, Bld: 159 mg/dL — ABNORMAL HIGH (ref 70–99)
Potassium: 3.5 mmol/L (ref 3.5–5.1)
Sodium: 140 mmol/L (ref 135–145)
Total Bilirubin: 0.5 mg/dL (ref 0.3–1.2)
Total Protein: 7.7 g/dL (ref 6.5–8.1)

## 2022-01-23 LAB — URINALYSIS, ROUTINE W REFLEX MICROSCOPIC
Bacteria, UA: NONE SEEN
Bilirubin Urine: NEGATIVE
Glucose, UA: NEGATIVE mg/dL
Hgb urine dipstick: NEGATIVE
Ketones, ur: 80 mg/dL — AB
Leukocytes,Ua: NEGATIVE
Nitrite: NEGATIVE
Protein, ur: 30 mg/dL — AB
Specific Gravity, Urine: 1.027 (ref 1.005–1.030)
pH: 5 (ref 5.0–8.0)

## 2022-01-23 LAB — TRIGLYCERIDES: Triglycerides: 69 mg/dL (ref <150)

## 2022-01-23 LAB — LIPASE, BLOOD: Lipase: 2957 U/L — ABNORMAL HIGH (ref 11–51)

## 2022-01-23 LAB — HIV ANTIBODY (ROUTINE TESTING W REFLEX): HIV Screen 4th Generation wRfx: NONREACTIVE

## 2022-01-23 LAB — PROTIME-INR
INR: 2.1 — ABNORMAL HIGH (ref 0.8–1.2)
Prothrombin Time: 23.7 seconds — ABNORMAL HIGH (ref 11.4–15.2)

## 2022-01-23 LAB — ETHANOL: Alcohol, Ethyl (B): <10 mg/dL

## 2022-01-23 MED ORDER — PANTOPRAZOLE SODIUM 40 MG IV SOLR
40.0000 mg | INTRAVENOUS | Status: DC
  Administered 2022-01-23 – 2022-01-25 (×3): 40 mg via INTRAVENOUS
  Filled 2022-01-23 (×3): qty 10

## 2022-01-23 MED ORDER — WARFARIN - PHARMACIST DOSING INPATIENT: Freq: Every day | Status: DC

## 2022-01-23 MED ORDER — ONDANSETRON HCL 4 MG PO TABS: 4.0000 mg | ORAL_TABLET | Freq: Four times a day (QID) | ORAL | Status: DC | PRN

## 2022-01-23 MED ORDER — ACETAMINOPHEN 650 MG RE SUPP: 650.0000 mg | Freq: Four times a day (QID) | RECTAL | Status: DC | PRN

## 2022-01-23 MED ORDER — SODIUM CHLORIDE 0.9% FLUSH
3.0000 mL | Freq: Two times a day (BID) | INTRAVENOUS | Status: DC
  Administered 2022-01-23 – 2022-01-25 (×4): 3 mL via INTRAVENOUS

## 2022-01-23 MED ORDER — KETOROLAC TROMETHAMINE 30 MG/ML IJ SOLN
30.0000 mg | Freq: Once | INTRAMUSCULAR | Status: DC
  Filled 2022-01-23: qty 1

## 2022-01-23 MED ORDER — METOPROLOL SUCCINATE ER 25 MG PO TB24
12.5000 mg | ORAL_TABLET | Freq: Every day | ORAL | Status: DC
  Administered 2022-01-23 – 2022-01-25 (×3): 12.5 mg via ORAL
  Filled 2022-01-23 (×3): qty 1

## 2022-01-23 MED ORDER — IOHEXOL 300 MG/ML  SOLN
100.0000 mL | Freq: Once | INTRAMUSCULAR | Status: AC | PRN
  Administered 2022-01-23: 100 mL via INTRAVENOUS

## 2022-01-23 MED ORDER — LORATADINE 10 MG PO TABS: 10.0000 mg | ORAL_TABLET | Freq: Every day | ORAL | Status: DC | PRN

## 2022-01-23 MED ORDER — ASPIRIN 81 MG PO TBEC
81.0000 mg | DELAYED_RELEASE_TABLET | Freq: Every day | ORAL | Status: DC
  Administered 2022-01-23 – 2022-01-25 (×3): 81 mg via ORAL
  Filled 2022-01-23 (×3): qty 1

## 2022-01-23 MED ORDER — LACTATED RINGERS IV BOLUS
1000.0000 mL | Freq: Once | INTRAVENOUS | Status: AC
  Administered 2022-01-23: 1000 mL via INTRAVENOUS

## 2022-01-23 MED ORDER — ENOXAPARIN SODIUM 80 MG/0.8ML IJ SOSY: 80.0000 mg | PREFILLED_SYRINGE | Freq: Two times a day (BID) | INTRAMUSCULAR | Status: DC

## 2022-01-23 MED ORDER — OXYCODONE HCL 5 MG PO TABS
5.0000 mg | ORAL_TABLET | Freq: Four times a day (QID) | ORAL | Status: DC | PRN
  Administered 2022-01-24: 5 mg via ORAL
  Filled 2022-01-23: qty 1

## 2022-01-23 MED ORDER — ALBUTEROL SULFATE (2.5 MG/3ML) 0.083% IN NEBU: 2.5000 mg | INHALATION_SOLUTION | RESPIRATORY_TRACT | Status: DC | PRN

## 2022-01-23 MED ORDER — ACETAMINOPHEN 325 MG PO TABS
650.0000 mg | ORAL_TABLET | Freq: Four times a day (QID) | ORAL | Status: DC | PRN
  Administered 2022-01-23 – 2022-01-25 (×6): 650 mg via ORAL
  Filled 2022-01-23 (×6): qty 2

## 2022-01-23 MED ORDER — ONDANSETRON HCL 4 MG/2ML IJ SOLN
4.0000 mg | Freq: Four times a day (QID) | INTRAMUSCULAR | Status: DC | PRN
  Administered 2022-01-23 – 2022-01-24 (×2): 4 mg via INTRAVENOUS
  Filled 2022-01-23 (×2): qty 2

## 2022-01-23 MED ORDER — WARFARIN SODIUM 4 MG PO TABS
4.5000 mg | ORAL_TABLET | ORAL | Status: DC
  Administered 2022-01-23 – 2022-01-24 (×2): 4.5 mg via ORAL
  Filled 2022-01-23 (×3): qty 1

## 2022-01-23 MED ORDER — ENOXAPARIN SODIUM 80 MG/0.8ML IJ SOSY
80.0000 mg | PREFILLED_SYRINGE | Freq: Two times a day (BID) | INTRAMUSCULAR | Status: DC
Start: 2022-01-23 — End: 2022-01-23
  Filled 2022-01-23: qty 0.8

## 2022-01-23 MED ORDER — MORPHINE SULFATE (PF) 2 MG/ML IV SOLN
2.0000 mg | INTRAVENOUS | Status: AC | PRN
  Administered 2022-01-23 – 2022-01-24 (×4): 2 mg via INTRAVENOUS
  Filled 2022-01-23 (×4): qty 1

## 2022-01-23 MED ORDER — WARFARIN SODIUM 6 MG PO TABS: 6.0000 mg | ORAL_TABLET | ORAL | Status: DC

## 2022-01-23 MED ORDER — MORPHINE SULFATE (PF) 4 MG/ML IV SOLN
4.0000 mg | Freq: Once | INTRAVENOUS | Status: AC
  Administered 2022-01-23: 4 mg via INTRAVENOUS
  Filled 2022-01-23: qty 1

## 2022-01-23 MED ORDER — SODIUM CHLORIDE 0.9 % IV SOLN: INTRAVENOUS | Status: DC

## 2022-01-23 NOTE — Progress Notes (Signed)
ANTICOAGULATION CONSULT NOTE - Initial Consult  Pharmacy Consult for warfarin  Indication: mechanical aortic valve   Allergies  Allergen Reactions   Amoxicillin Hives   Vital Signs: Temp: 98 F (36.7 C) (08/15 1531) Temp Source: Oral (08/15 0630) BP: 123/80 (08/15 1531) Pulse Rate: 78 (08/15 1531)  Labs: Recent Labs    01/22/22 2304 01/22/22 2313 01/23/22 1600  HGB  --  13.8  --   HCT  --  42.9  --   PLT  --  361  --   LABPROT  --  21.4* 23.7*  INR  --  1.9* 2.1*  CREATININE 0.92  --   --     Estimated Creatinine Clearance: 123.9 mL/min (by C-G formula based on SCr of 0.92 mg/dL).   Medical History: Past Medical History:  Diagnosis Date   Asthma    Headache    migraines   Heart murmur    Mild aortic stenosis    Pancreatitis    Pneumonia    as a child x several   Substance abuse (HCC)     Medications:  Scheduled:   aspirin EC  81 mg Oral Daily   enoxaparin  80 mg Subcutaneous Q12H   metoprolol succinate  12.5 mg Oral Daily   pantoprazole (PROTONIX) IV  40 mg Intravenous Q24H   sodium chloride flush  3 mL Intravenous Q12H    Assessment: INR goal per outpatient cardiology notes 2-3. Patient's last warfarin dose was 8/14 @ 19:00. INR on 8/14 subtherapeutic, repeat INR on 8/15 2.1  Most recent outpatient regimen is 6mg  every Monday, Wednesday, Friday and 4.5mg  all other day. This was an increase from his typical regimen of 6mg  Monday, Friday and 4.5mg  all other days due to a subtherapeutic INR secondary to recent procedure. Patient was taking the boosted regimen while bridging with Lovenox. Patient was scheduled to have an appointment today (8/15) to assess if patient still required Lovenox bridge.   Patient has not signs or symptoms of bleeding. Patient's chief complaint is pancreatitis with diarrhea.   Goal of Therapy:  INR 2-3, per outpatient cardiology notes  Monitor platelets by anticoagulation protocol: Yes   Plan:  Discontinue lovenox order. Will  start patient on typical un-boosted regimen of 6mg  Monday/Friday and 4.5mg  all other days as anticipate INR will increase due to patient's limited oral intake and nausea secondary to pancreatitis.   Sunday BS, PharmD, BCPS Clinical Pharmacist 01/23/2022 4:44 PM  Contact: [phone] after 3 PM  "Be curious, not judgmental..." -Greta Doom

## 2022-01-23 NOTE — ED Provider Notes (Signed)
Lipase 2900.  Consulting civil engineer notified.  Working on a bed.  CT pending.  Fluids and pain meds ordered.   Roxy Horseman, [name] 01/23/22 0040    Gilda Crease, MD 01/23/22 [phone]

## 2022-01-23 NOTE — ED Notes (Signed)
Apple juice and ice provided at this time patient updated on the plan of care at this time and verbalizes understanding

## 2022-01-23 NOTE — Discharge Instructions (Addendum)
[name] [name],  You were in the hospital because of acute pancreatitis. This was managed with bowel rest and has improved. Please continue to eat a low fat diet and abstain completely from alcohol intake. You had a mild fever which appears to be related to your diarrheal illness. Please ensure you stay well hydrated. Please continue your Lovenox on discharge. Hopefully, you can have your INR checked tomorrow or early next week; I have sent a message to your cardiology group, but please also follow-up on this.

## 2022-01-23 NOTE — ED Notes (Signed)
Pt reports he does feel better since receiving fluids, still having pain 8/10, in the left lower abdominal area. No nausea.

## 2022-01-23 NOTE — H&P (Signed)
History and Physical    Patient: [name] [name]:[id number] DOB: 01-Feb-1991 DOA: 01/22/2022 DOS: the patient was seen and examined on 01/23/2022 PCP: Jeanie Sewer, NP  Patient coming from: Home via EMS  Chief Complaint:  Chief Complaint  Patient presents with   Nausea   HPI: [name] [name] is a 31 y.o. male with medical history significant of bicuspid aortic valve s/p aortic valve replacement with mechanical valve on 4/19, chronic anticoagulation, asthma, pancreatitis, and migraine headaches who presents with complaints of abdominal pain and nausea.  Patient and his significant other had stopped at [facility] yesterday afternoon to eat lunch.  He developed left lower quadrant abdominal pressure like pain that he rated as a 4 out of 10.  The pain did not radiate anywhere.  He reported associated symptoms of nausea, chills, and sweats.  His significant other developed nausea and vomiting for which they thought symptoms were secondary to food poisoning.  Later on that evening patient reported abdominal pain symptoms worsen.  He tried to vomit, but was unable to.  He he recently had had right inguinal hernia surgery approximately 3 months ago.  Ever since patient reported that he had been on Lovenox shots due to having a difficult time getting his INR back in a therapeutic range.  Patient previously had a history of pancreatitis that was thought related to alcohol use.  He reports that he cut back and only drinks 2-3 times a week drinking 2 to 4 glasses of wine typically.  Denies any prior history of withdrawal symptoms.   Upon admission into the emergency department patient was seen to be afebrile with blood pressures 125/82 to 157/91, and all other vital signs maintained.  Labs significant for WBC 17.3 with left shift, glucose 159, lipase 2957, INR 1.9, and all other labs relatively within normal limits.  Urinalysis showed no a signs of infection.  CT scan of the  abdomen pelvis significant for acute pancreatitis and probable duodenitis with a hypodensity at the head of the pancreas measuring 1.1 cm concerning for developing pseudocyst.  He had been given 2 L of lactated Ringer's, Zofran 4 mg p.o., and a total of 8 mg of morphine IV overnight.    Review of Systems: As mentioned in the history of present illness. All other systems reviewed and are negative. Past Medical History:  Diagnosis Date   Asthma    Headache    migraines   Heart murmur    Mild aortic stenosis    Pancreatitis    Pneumonia    as a child x several   Substance abuse [facility])    Past Surgical History:  Procedure Laterality Date   AORTIC VALVE REPLACEMENT N/A 09/27/2021   Procedure: AORTIC VALVE REPLACEMENT (AVR) USING ON-X AORTIC VALVE SIZE 23MM;  Surgeon: [name], MD;  Location: [place];  Service: Open Heart Surgery;  Laterality: N/A;   INGUINAL HERNIA REPAIR Right 01/05/2022   Procedure: OPEN REPAIR INCARCERATED RIGHT INGUINAL HERNIA WITH MESH;  Surgeon: Greer Pickerel, MD;  Location: [facility];  Service: General;  Laterality: Right;  90 MIN - ROOM 2   REPLACEMENT ASCENDING AORTA N/A 09/27/2021   Procedure: REPLACEMENT ASCENDING AND INNOMINATE ANEURYSM USING HEMASHIELD PLATINUM [id number];  Surgeon: [name], MD;  Location: [place];  Service: Open Heart Surgery;  Laterality: N/A;   RIGHT HEART CATH AND CORONARY ANGIOGRAPHY N/A 09/07/2021   Procedure: RIGHT HEART CATH AND CORONARY ANGIOGRAPHY;  Surgeon: Early Osmond, MD;  Location:  [place] INVASIVE CV [facility];  Service: Cardiovascular;  Laterality: N/A;   TEE WITHOUT CARDIOVERSION N/A 09/27/2021   Procedure: TRANSESOPHAGEAL ECHOCARDIOGRAM (TEE);  Surgeon: [name], MD;  Location: [place];  Service: Open Heart Surgery;  Laterality: N/A;   Social History:  reports that he has never smoked. He has never used smokeless tobacco. He reports current alcohol use. He reports that he does not currently use drugs  after having used the following drugs: Other-see comments.  Allergies  Allergen Reactions   Amoxicillin Hives    Family History  Problem Relation Age of Onset   Pancreatitis Maternal Uncle     Prior to Admission medications   Medication Sig Start Date End Date Taking? Authorizing Provider  albuterol (VENTOLIN HFA) 108 (90 Base) MCG/ACT inhaler Inhale 1-2 puffs into the lungs every 6 (six) hours as needed for wheezing or shortness of breath.    [provider]  aspirin EC 81 MG EC tablet Take 1 tablet (81 mg total) by mouth daily. Swallow whole. 10/02/21   Barrett, Erin R, [name]  enoxaparin (LOVENOX) 80 MG/0.8ML injection Inject 0.8 mLs (80 mg total) into the skin every 12 (twelve) hours. 01/01/22   Early Osmond, MD  ferrous sulfate 325 (65 FE) MG EC tablet Take 1 tablet (325 mg total) by mouth 2 (two) times daily. Patient not taking: Reported on 11/07/2021 10/02/21 10/02/22  Barrett, Lodema Hong, [name]  Fiber POWD Take 1 Scoop by mouth daily as needed (constipation).    [provider]  loratadine (CLARITIN) 10 MG tablet Take 10 mg by mouth daily.    [provider]  metoprolol succinate (TOPROL XL) 25 MG 24 hr tablet Take 0.5 tablets (12.5 mg total) by mouth at bedtime. 10/16/21   Early Osmond, MD  oxyCODONE (OXY IR/ROXICODONE) 5 MG immediate release tablet Take 1 tablet (5 mg total) by mouth every 6 (six) hours as needed for severe pain. 01/05/22   Greer Pickerel, MD  warfarin (COUMADIN) 3 MG tablet Take 1.5 tablets daily except 2 tablets on Fridays or as directed by Anticoagulation Clinic. Patient taking differently: Take 4.5-6 mg by mouth See admin instructions. Take 4.5 mg by mouth daily except take 6 mg on Monday and Friday 12/11/21   Early Osmond, MD    Physical Exam: Vitals:   01/22/22 2313 01/23/22 0127 01/23/22 0526 01/23/22 0630  BP: 125/82 (!) 145/98 (!) 148/91 (!) 157/91  Pulse: 66 64 82 80  Resp: $Remo'16 16 16 17  'EeJxb$ Temp: 97.9 F (36.6 C)  97.9 F (36.6  C) 97.7 F (36.5 C)  TempSrc: Oral  Oral Oral  SpO2: 98% 98% 98% 99%   Constitutional: Young male NAD, calm, comfortable Eyes: PERRL, lids and conjunctivae normal ENMT: Mucous membranes are moist. Posterior pharynx clear of any exudate or lesions.Normal dentition.  Neck: normal, supple, no masses  Respiratory: clear to auscultation bilaterally, no wheezing, no crackles. Normal respiratory effort. No accessory muscle use.  Cardiovascular: Regular rate and rhythm, with mechanical click. 2+ pedal pulses.   Abdomen: Left upper quadrant abdominal tenderness to palpation.  Bowel sounds positive.  Musculoskeletal: no clubbing / cyanosis. No joint deformity upper and lower extremities. Skin: Healed sternal scar Neurologic: CN 2-12 grossly intact. Strength 5/5 in all 4.  Psychiatric: Normal judgment and insight. Alert and oriented x 3. Normal mood.   Data Reviewed:   Reviewed labs, imaging, and pertinent records as noted above in HPI.  Assessment and Plan: Pancreatitis possible duodenitis Patient presented with  complaints of left upper quadrant abdominal pain after [employer].  Lipase noted to be elevated at 2957. CT noting pancreatitis with possible duodenitis, and concern for possible developing pseudocyst.  Patient with prior history of pancreatitis thought related to alcohol abuse, but patient reports decreased use.  Unclear cause of symptoms at this time.  Patient reports last drink 2 days ago.  Question possibility of a gastroenteritis related to food eaten. -Admit to a telemetry bed -Monitor intake and output -Incentive spirometry -Check triglyceride level -Normal saline IV fluids at 150 mL/h -Oxycodone/morphine IV as needed for pain  Leukocytosis Acute.  WBC elevated at 17.3.  No other SIRS criteria met.  Thought possibly secondary to above. -Continue to monitor off of antibiotics at this time  S/p mechanical aortic valve subtherapeutic INR Patient just recently underwent  mechanical aortic valve replacement due to bicuspid aortic valve with Dr. Blase Mess of cardiothoracic surgery 09/27/2021.  INR was noted to be 1.9.  Goal 2.5-3.5.  EF was noted to be preserved at 60-65% on most recent echocardiogram without diastolic dysfunction. -Coumadin per pharmacy -Bridge with Lovenox  Essential hypertension Blood pressures elevated up to 157/91.  Home medication regimen includes metoprolol succinate 12.5 mg daily. -Continue beta-blocker  Recent right inguinal hernia s/p repair  Abnormal CT of abdomen pelvis Other incidental findings on CT included hepatic steatosis, left inguinal hernia containing nonobstructed colon, right inguinal hernia containing prominent vasculature, and small right hydrocele. -Recommend follow-up in outpatient setting with primary in regards to further management of these issues   GI prophylaxis: Protonix DVT prophylaxis: Coumadin Advance Care Planning:   Code Status: Full Code    Consults: None  Family Communication: Patient stated he already updated family at this time. Severity of Illness: The appropriate patient status for this patient is INPATIENT. Inpatient status is judged to be reasonable and necessary in order to provide the required intensity of service to ensure the patient's safety. The patient's presenting symptoms, physical exam findings, and initial radiographic and laboratory data in the context of their chronic comorbidities is felt to place them at high risk for further clinical deterioration. Furthermore, it is not anticipated that the patient will be medically stable for discharge from the hospital within 2 midnights of admission.   * I certify that at the point of admission it is my clinical judgment that the patient will require inpatient hospital care spanning beyond 2 midnights from the point of admission due to high intensity of service, high risk for further deterioration and high frequency of surveillance  required.*  Author: Norval Morton, MD 01/23/2022 7:37 AM  For on call review [email or web address].

## 2022-01-23 NOTE — ED Provider Notes (Signed)
[facility] EMERGENCY DEPARTMENT Provider Note   CSN: [id number] Arrival date & time: 01/22/22  2303     History  Chief Complaint  Patient presents with   Nausea    [name] [name] is a 31 y.o. male with a past medical history of bicuspid aortic valve, mild aortic stenosis, transaminitis and acute pancreatitis in 2022 presenting today with diarrhea.  He reports that yesterday he stopped today when he is on a road trip and shortly after began to have diarrhea.  Reports his partner also became ill from the same meal.  Reports history of pancreatitis, this feels somewhat similar.  Had an episode of this last year.  Reports that he used to drink a lot of alcohol but now he only drinks 2 to 4 glasses 3-4 times a week.  Reports to alcohol drinks on Sunday.  HPI     Home Medications Prior to Admission medications   Medication Sig Start Date End Date Taking? Authorizing Provider  albuterol (VENTOLIN HFA) 108 (90 Base) MCG/ACT inhaler Inhale 1-2 puffs into the lungs every 6 (six) hours as needed for wheezing or shortness of breath.    [provider]  aspirin EC 81 MG EC tablet Take 1 tablet (81 mg total) by mouth daily. Swallow whole. 10/02/21   Barrett, Erin R, [name]  enoxaparin (LOVENOX) 80 MG/0.8ML injection Inject 0.8 mLs (80 mg total) into the skin every 12 (twelve) hours. 01/01/22   Orbie Pyo, MD  ferrous sulfate 325 (65 FE) MG EC tablet Take 1 tablet (325 mg total) by mouth 2 (two) times daily. Patient not taking: Reported on 11/07/2021 10/02/21 10/02/22  Barrett, Rae Roam, [name]  Fiber POWD Take 1 Scoop by mouth daily as needed (constipation).    [provider]  loratadine (CLARITIN) 10 MG tablet Take 10 mg by mouth daily.    [provider]  metoprolol succinate (TOPROL XL) 25 MG 24 hr tablet Take 0.5 tablets (12.5 mg total) by mouth at bedtime. 10/16/21   Orbie Pyo, MD  oxyCODONE (OXY IR/ROXICODONE) 5 MG immediate  release tablet Take 1 tablet (5 mg total) by mouth every 6 (six) hours as needed for severe pain. 01/05/22   Gaynelle Adu, MD  warfarin (COUMADIN) 3 MG tablet Take 1.5 tablets daily except 2 tablets on Fridays or as directed by Anticoagulation Clinic. Patient taking differently: Take 4.5-6 mg by mouth See admin instructions. Take 4.5 mg by mouth daily except take 6 mg on Monday and Friday 12/11/21   Orbie Pyo, MD      Allergies    Amoxicillin    Review of Systems   Review of Systems  Physical Exam Updated Vital Signs BP (!) 157/91 (BP Location: Right Arm)   Pulse 80   Temp 97.7 F (36.5 C) (Oral)   Resp 17   SpO2 99%  Physical Exam Vitals and nursing note reviewed.  Constitutional:      Appearance: Normal appearance.  HENT:     Head: Normocephalic and atraumatic.  Eyes:     General: No scleral icterus.    Conjunctiva/sclera: Conjunctivae normal.  Pulmonary:     Effort: Pulmonary effort is normal. No respiratory distress.  Abdominal:     General: Abdomen is flat.     Palpations: Abdomen is soft.     Tenderness: There is abdominal tenderness (Epigastric and left upper quadrant).  Skin:    General: Skin is warm and dry.     Findings: No  rash.  Neurological:     Mental Status: He is alert.  Psychiatric:        Mood and Affect: Mood normal.     ED Results / Procedures / Treatments   Labs (all labs ordered are listed, but only abnormal results are displayed) Labs Reviewed  COMPREHENSIVE METABOLIC PANEL - Abnormal; Notable for the following components:      Result Value   Glucose, Bld 159 (*)    All other components within normal limits  LIPASE, BLOOD - Abnormal; Notable for the following components:   Lipase 2,957 (*)    All other components within normal limits  CBC WITH DIFFERENTIAL/PLATELET - Abnormal; Notable for the following components:   WBC 17.3 (*)    Neutro Abs 13.2 (*)    Monocytes Absolute 1.4 (*)    All other components within normal limits   URINALYSIS, ROUTINE W REFLEX MICROSCOPIC - Abnormal; Notable for the following components:   Ketones, ur 80 (*)    Protein, ur 30 (*)    All other components within normal limits  PROTIME-INR - Abnormal; Notable for the following components:   Prothrombin Time 21.4 (*)    INR 1.9 (*)    All other components within normal limits    EKG None  Radiology CT ABDOMEN PELVIS W CONTRAST  Result Date: 01/23/2022 CLINICAL DATA:  Pancreatitis with nausea and diarrhea. EXAM: CT ABDOMEN AND PELVIS WITH CONTRAST TECHNIQUE: Multidetector CT imaging of the abdomen and pelvis was performed using the standard protocol following bolus administration of intravenous contrast. RADIATION DOSE REDUCTION: This exam was performed according to the departmental dose-optimization program which includes automated exposure control, adjustment of the mA and/or kV according to patient size and/or use of iterative reconstruction technique. CONTRAST:  OMNIPAQUE IOHEXOL 300 MG/ML  SOLN COMPARISON:  06/06/2020. FINDINGS: Lower chest: Mild atelectasis is noted at the lung bases. Hepatobiliary: No focal liver abnormality is seen. Hepatic steatosis is noted. No gallstones, gallbladder wall thickening, or biliary dilatation. Pancreas: There is peripancreatic fat stranding and a small amount of free fluid in the anterior pararenal spaces bilaterally and surrounding the tail of the pancreas, compatible with acute pancreatitis. No pancreatic ductal dilatation. Hypodensity is present in the head of the pancreas measuring 1.1 cm. Spleen: Normal in size without focal abnormality. Adrenals/Urinary Tract: No adrenal nodule or mass. The kidneys enhance symmetrically. No renal calculus or hydronephrosis. The bladder is unremarkable. Stomach/Bowel: The stomach is within normal limits. There is fat stranding about the proximal duodenum with mild bowel wall thickening, possible duodenitis. No bowel obstruction, free air, or pneumatosis. A normal  appendix is seen in the right lower quadrant. There is a left inguinal hernia containing nonobstructed colon. There is fatty infiltration of the walls of the colon suggesting chronic inflammatory changes. Vascular/Lymphatic: The portal vein, superior mesenteric vein, and splenic vein are patent. No abdominal or pelvic lymphadenopathy. Reproductive: Prostate is unremarkable. Other: There is a prominent right inguinal hernia containing a prominent vessels and a small right hydrocele is noted. Small amount of ascites is noted in the upper abdomen. Musculoskeletal: Sternotomy wires are present over the midline. No acute osseous abnormality is seen. IMPRESSION: 1. Findings compatible with acute pancreatitis and probable duodenitis. There is a hypodensity in the head of the pancreas measuring 1.1 cm, possible developing pseudocyst. 2. Hepatic steatosis. 3. Left inguinal hernia containing nonobstructed colon. 4. Right inguinal hernia containing prominent vasculature. 5. Small right hydrocele. Electronically Signed   By: Charlestine Night.D.  On: 01/23/2022 04:35    Procedures Procedures   Medications Ordered in ED Medications  ondansetron (ZOFRAN-ODT) disintegrating tablet 4 mg (4 mg Oral Given 01/22/22 2314)  lactated ringers bolus 1,000 mL (1,000 mLs Intravenous New Bag/Given 01/22/22 2323)  morphine (PF) 4 MG/ML injection 4 mg (4 mg Intravenous Given 01/23/22 0122)  lactated ringers bolus 1,000 mL (1,000 mLs Intravenous New Bag/Given 01/23/22 0123)  iohexol (OMNIPAQUE) 300 MG/ML solution 100 mL (100 mLs Intravenous Contrast Given 01/23/22 0351)  morphine (PF) 4 MG/ML injection 4 mg (4 mg Intravenous Given 01/23/22 5400)    ED Course/ Medical Decision Making/ A&P                           Medical Decision Making Risk Prescription drug management. Decision regarding hospitalization.   This patient presents to the ED for concern of abdominal pain and diarrhea.  Differential includes but is not limited to  pancreatitis, cholecystitis, cholelithiasis, gastroenteritis.   This is not an exhaustive differential.    Past Medical History / Co-morbidities / Social History: History of pancreatitis   Additional history: Per chart review, patient was admitted to the hospital for alcohol induced pancreatitis at the end of 2021.  He has had multiple bouts of pancreatitis, all secondary to alcohol use.   Physical Exam: Pertinent physical exam findings include left upper quadrant and epigastric pain  Lab Tests: I ordered, and personally interpreted labs.  The pertinent results include:  WBC 17.3 Lipase 2,957 INR 1.9   Imaging Studies: I ordered and independently visualized and interpreted CT abdomen pelvis which showed acute pancreatitis with developing pseudocyst. I agree with the radiologist interpretation.   Medications: I ordered medication including fluids, morphine and Zofran. Reevaluation of the patient after these medicines showed that the patient improved. I have reviewed the patients home medicines and have made adjustments as needed.   Disposition: After consideration of the diagnostic results and the patients response to treatment, I feel that Patient requires admission.  I consider discharge however his white cell count is elevated, INR slightly bumped and lipase nearly 3000.  This coupled with his developing pseudocyst requires admission.   I discussed this case with my attending physician Dr. Particia Nearing who cosigned this note including patient's presenting symptoms, physical exam, and planned diagnostics and interventions. Attending physician stated agreement with plan or made changes to plan which were implemented.     Final Clinical Impression(s) / ED Diagnoses Final diagnoses:  Acute pancreatitis, unspecified complication status, unspecified pancreatitis type    Rx / DC Orders Admit for acute pancreatitis to Dr. Katrinka Blazing with [facility]    Aliah Eriksson, Gabriel Cirri, [name] 01/23/22 [id number]     Jacalyn Lefevre, MD 01/23/22 [phone]

## 2022-01-23 NOTE — ED Notes (Addendum)
Phlebotomy called at this time due to hemolyzed PTT/INR. Phlebotomy reports they will come by and draw the lab

## 2022-01-24 DIAGNOSIS — Z952 Presence of prosthetic heart valve: Secondary | ICD-10-CM | POA: Diagnosis not present

## 2022-01-24 DIAGNOSIS — I10 Essential (primary) hypertension: Secondary | ICD-10-CM | POA: Diagnosis not present

## 2022-01-24 DIAGNOSIS — K859 Acute pancreatitis without necrosis or infection, unspecified: Secondary | ICD-10-CM | POA: Diagnosis not present

## 2022-01-24 DIAGNOSIS — D72829 Elevated white blood cell count, unspecified: Secondary | ICD-10-CM | POA: Diagnosis not present

## 2022-01-24 LAB — CBC
HCT: 38 % — ABNORMAL LOW (ref 39.0–52.0)
Hemoglobin: 12 g/dL — ABNORMAL LOW (ref 13.0–17.0)
MCH: 27.6 pg (ref 26.0–34.0)
MCHC: 31.6 g/dL (ref 30.0–36.0)
MCV: 87.4 fL (ref 80.0–100.0)
Platelets: 236 10*3/uL (ref 150–400)
RBC: 4.35 MIL/uL (ref 4.22–5.81)
RDW: 14.6 % (ref 11.5–15.5)
WBC: 9.1 10*3/uL (ref 4.0–10.5)
nRBC: 0 % (ref 0.0–0.2)

## 2022-01-24 LAB — COMPREHENSIVE METABOLIC PANEL
ALT: 15 U/L (ref 0–44)
AST: 16 U/L (ref 15–41)
Albumin: 3.5 g/dL (ref 3.5–5.0)
Alkaline Phosphatase: 44 U/L (ref 38–126)
Anion gap: 7 (ref 5–15)
BUN: <5 mg/dL — ABNORMAL LOW (ref 6–20)
CO2: 25 mmol/L (ref 22–32)
Calcium: 8.5 mg/dL — ABNORMAL LOW (ref 8.9–10.3)
Chloride: 106 mmol/L (ref 98–111)
Creatinine, Ser: 0.65 mg/dL (ref 0.61–1.24)
GFR, Estimated: >60 mL/min (ref >60)
Glucose, Bld: 124 mg/dL — ABNORMAL HIGH (ref 70–99)
Potassium: 3.5 mmol/L (ref 3.5–5.1)
Sodium: 138 mmol/L (ref 135–145)
Total Bilirubin: 0.7 mg/dL (ref 0.3–1.2)
Total Protein: 6.1 g/dL — ABNORMAL LOW (ref 6.5–8.1)

## 2022-01-24 LAB — PROTIME-INR
INR: 2.3 — ABNORMAL HIGH (ref 0.8–1.2)
Prothrombin Time: 25.5 seconds — ABNORMAL HIGH (ref 11.4–15.2)

## 2022-01-24 NOTE — ED Notes (Signed)
ED TO INPATIENT HANDOFF REPORT  ED Nurse Name and Phone #: Loistine Simas RN *[phone]  S Name/Age/Gender [name] [name] 31 y.o. male Room/Bed: 037C/037C  Code Status   Code Status: Full Code  Home/SNF/Other Home A&O x4 Is this baseline? Yes   Triage Complete: Triage complete  Chief Complaint Pancreatitis [K85.90]  Triage Note Pt arrives via [facility]. Per report, the pt has been traveling and his partner started experiencing gi symptoms after eating french fries. The pt has had diarrhea and nausea. En route vitals \\150 /90, hr 90, dbg 162.  Pt with onset of abdominal pain after eating around 4pm, pain started become worse with nausea and diarrhea later in the evening.    Allergies Allergies  Allergen Reactions   Amoxicillin Hives    Level of Care/Admitting Diagnosis ED Disposition     ED Disposition  Admit   Condition  --   Comment  Hospital Area: [facility] [[id number]]  Level of Care: Telemetry Medical [104]  May admit patient to [facility] or [facility] if equivalent level of care is available:: No  Covid Evaluation: Asymptomatic - no recent exposure (last 10 days) testing not required  Diagnosis: Pancreatitis [202663]  Admitting Physician: Clydie Braun [[id number]]  Attending Physician: Clydie Braun [[id number]]  Certification:: I certify this patient will need inpatient services for at least 2 midnights  Estimated Length of Stay: 2          B Medical/Surgery History Past Medical History:  Diagnosis Date   Asthma    Headache    migraines   Heart murmur    Mild aortic stenosis    Pancreatitis    Pneumonia    as a child x several   Substance abuse (HCC)    Past Surgical History:  Procedure Laterality Date   AORTIC VALVE REPLACEMENT N/A 09/27/2021   Procedure: AORTIC VALVE REPLACEMENT (AVR) USING ON-X AORTIC VALVE SIZE ;  Surgeon: Loreli Slot, MD;  Location: [facility] OR;  Service: Open Heart Surgery;  Laterality:  N/A;   INGUINAL HERNIA REPAIR Right 01/05/2022   Procedure: OPEN REPAIR INCARCERATED RIGHT INGUINAL HERNIA WITH MESH;  Surgeon: [name] Adu, MD;  Location: [facility] ORS;  Service: General;  Laterality: Right;  90 MIN - ROOM 2   REPLACEMENT ASCENDING AORTA N/A 09/27/2021   Procedure: REPLACEMENT ASCENDING AND INNOMINATE ANEURYSM USING HEMASHIELD PLATINUM [id number];  Surgeon: Loreli Slot, MD;  Location: [facility] OR;  Service: Open Heart Surgery;  Laterality: N/A;   RIGHT HEART CATH AND CORONARY ANGIOGRAPHY N/A 09/07/2021   Procedure: RIGHT HEART CATH AND CORONARY ANGIOGRAPHY;  Surgeon: Orbie Pyo, MD;  Location: [facility] INVASIVE CV LAB;  Service: Cardiovascular;  Laterality: N/A;   TEE WITHOUT CARDIOVERSION N/A 09/27/2021   Procedure: TRANSESOPHAGEAL ECHOCARDIOGRAM (TEE);  Surgeon: Loreli Slot, MD;  Location: [facility] OR;  Service: Open Heart Surgery;  Laterality: N/A;     A IV Location/Drains/Wounds Patient Lines/Drains/Airways Status     Active Line/Drains/Airways     Name Placement date Placement time Site Days   Peripheral IV 01/22/22 20 G Anterior;Distal;Right;Upper Arm 01/22/22  2323  Arm  2   Incision (Closed) 09/27/21 Chest Other (Comment) 09/27/21  0954  -- 119   Incision (Closed) 09/27/21 Chest Right 09/27/21  0954  -- 119   Incision (Closed) 01/05/22 Abdomen Right 01/05/22  1251  -- 19            Intake/Output Last 24 hours  Intake/Output Summary (Last 24 hours)  at 01/24/2022 1324 Last data filed at 01/24/2022 0421 Gross per 24 hour  Intake 2004.83 ml  Output 700 ml  Net 1304.83 ml    Labs/Imaging Results for orders placed or performed during the hospital encounter of 01/22/22 (from the past 48 hour(s))  Comprehensive metabolic panel     Status: Abnormal   Collection Time: 01/22/22 11:04 PM  Result Value Ref Range   Sodium 140 135 - 145 mmol/L   Potassium 3.5 3.5 - 5.1 mmol/L   Chloride 105 98 - 111 mmol/L   CO2 22 22 - 32 mmol/L   Glucose, Bld 159 (H) 70  - 99 mg/dL    Comment: Glucose reference range applies only to samples taken after fasting for at least 8 hours.   BUN 14 6 - 20 mg/dL   Creatinine, Ser 3.64 0.61 - 1.24 mg/dL   Calcium 9.3 8.9 - 68.0 mg/dL   Total Protein 7.7 6.5 - 8.1 g/dL   Albumin 4.5 3.5 - 5.0 g/dL   AST 27 15 - 41 U/L   ALT 22 0 - 44 U/L   Alkaline Phosphatase 65 38 - 126 U/L   Total Bilirubin 0.5 0.3 - 1.2 mg/dL   GFR, Estimated >32 >12 mL/min    Comment: (NOTE) Calculated using the CKD-EPI Creatinine Equation (2021)    Anion gap 13 5 - 15    Comment: Performed at [facility] Lab, [address], [place], [place] [address]  Lipase, blood     Status: Abnormal   Collection Time: 01/22/22 11:13 PM  Result Value Ref Range   Lipase 2,957 (H) 11 - 51 U/L    Comment: RESULTS CONFIRMED BY MANUAL DILUTION Performed at [facility] Lab, [address] [place], [place] [address]   CBC with Diff     Status: Abnormal   Collection Time: 01/22/22 11:13 PM  Result Value Ref Range   WBC 17.3 (H) 4.0 - 10.5 K/uL   RBC 4.94 4.22 - 5.81 MIL/uL   Hemoglobin 13.8 13.0 - 17.0 g/dL   HCT 48.8 89.1 - 69.4 %   MCV 86.8 80.0 - 100.0 fL   MCH 27.9 26.0 - 34.0 pg   MCHC 32.2 30.0 - 36.0 g/dL   RDW 50.3 88.8 - 28.0 %   Platelets 361 150 - 400 K/uL   nRBC 0.0 0.0 - 0.2 %   Neutrophils Relative % 77 %   Neutro Abs 13.2 (H) 1.7 - 7.7 K/uL   Lymphocytes Relative 13 %   Lymphs Abs 2.3 0.7 - 4.0 K/uL   Monocytes Relative 8 %   Monocytes Absolute 1.4 (H) 0.1 - 1.0 K/uL   Eosinophils Relative 2 %   Eosinophils Absolute 0.3 0.0 - 0.5 K/uL   Basophils Relative 0 %   Basophils Absolute 0.0 0.0 - 0.1 K/uL   Immature Granulocytes 0 %   Abs Immature Granulocytes 0.07 0.00 - 0.07 K/uL    Comment: Performed at [facility] Lab, [address] [place], [place] [address]  Protime-INR     Status: Abnormal   Collection Time: 01/22/22 11:13 PM  Result Value Ref Range   Prothrombin Time 21.4 (H) 11.4 - 15.2 seconds   INR 1.9 (H)  0.8 - 1.2    Comment: (NOTE) INR goal varies based on device and disease states. Performed at [facility] Lab, [address], [place], [place] [address]   Urinalysis, Routine w reflex microscopic     Status: Abnormal   Collection  Time: 01/22/22 11:14 PM  Result Value Ref Range   Color, Urine YELLOW YELLOW   APPearance CLEAR CLEAR   Specific Gravity, Urine 1.027 1.005 - 1.030   pH 5.0 5.0 - 8.0   Glucose, UA NEGATIVE NEGATIVE mg/dL   Hgb urine dipstick NEGATIVE NEGATIVE   Bilirubin Urine NEGATIVE NEGATIVE   Ketones, ur 80 (A) NEGATIVE mg/dL   Protein, ur 30 (A) NEGATIVE mg/dL   Nitrite NEGATIVE NEGATIVE   Leukocytes,Ua NEGATIVE NEGATIVE   RBC / HPF 0-5 0 - 5 RBC/hpf   WBC, UA 0-5 0 - 5 WBC/hpf   Bacteria, UA NONE SEEN NONE SEEN   Squamous Epithelial / LPF 0-5 0 - 5   Mucus PRESENT     Comment: Performed at [facility], [address] [place], [place] [address]  HIV Antibody (routine testing w rflx)     Status: None   Collection Time: 01/23/22  7:44 AM  Result Value Ref Range   HIV Screen 4th Generation wRfx Non Reactive Non Reactive    Comment: Performed at [facility], [address] [place], [place] [address]  Ethanol     Status: None   Collection Time: 01/23/22  2:30 PM  Result Value Ref Range   Alcohol, Ethyl (B) <10 <10 mg/dL    Comment: (NOTE) Lowest detectable limit for serum alcohol is 10 mg/dL.  For medical purposes only. Performed at [facility], [address] [place], [place] [address]   Protime-INR     Status: Abnormal   Collection Time: 01/23/22  4:00 PM  Result Value Ref Range   Prothrombin Time 23.7 (H) 11.4 - 15.2 seconds   INR 2.1 (H) 0.8 - 1.2    Comment: (NOTE) INR goal varies based on device and disease states. Performed at [facility], [address] [place], [place] [address]   Triglycerides     Status: None   Collection Time: 01/23/22  4:00 PM  Result Value Ref Range   Triglycerides 69 <150 mg/dL     Comment: Performed at [facility], [address] [place], [place] [address]  CBC     Status: Abnormal   Collection Time: 01/24/22  2:46 AM  Result Value Ref Range   WBC 9.1 4.0 - 10.5 K/uL   RBC 4.35 4.22 - 5.81 MIL/uL   Hemoglobin 12.0 (L) 13.0 - 17.0 g/dL   HCT 13.038.0 (L) 86.539.0 - 78.452.0 %   MCV 87.4 80.0 - 100.0 fL   MCH 27.6 26.0 - 34.0 pg   MCHC 31.6 30.0 - 36.0 g/dL   RDW 69.614.6 29.511.5 - 28.415.5 %   Platelets 236 150 - 400 K/uL   nRBC 0.0 0.0 - 0.2 %    Comment: Performed at [facility]  Lab, [address] [place], [place] [address]  Comprehensive metabolic panel     Status: Abnormal   Collection Time: 01/24/22  2:46 AM  Result Value Ref Range   Sodium 138 135 - 145 mmol/L   Potassium 3.5 3.5 - 5.1 mmol/L   Chloride 106 98 - 111 mmol/L   CO2 25 22 - 32 mmol/L   Glucose, Bld 124 (H) 70 - 99 mg/dL    Comment: Glucose reference range applies only to samples taken after fasting for at least 8 hours.   BUN <5 (L) 6 - 20 mg/dL   Creatinine, Ser 0.100.65 0.61 - 1.24 mg/dL   Calcium 8.5 (L) 8.9 - 10.3 mg/dL  Total Protein 6.1 (L) 6.5 - 8.1 g/dL   Albumin 3.5 3.5 - 5.0 g/dL   AST 16 15 - 41 U/L   ALT 15 0 - 44 U/L   Alkaline Phosphatase 44 38 - 126 U/L   Total Bilirubin 0.7 0.3 - 1.2 mg/dL   GFR, Estimated >10 >25 mL/min    Comment: (NOTE) Calculated using the CKD-EPI Creatinine Equation (2021)    Anion gap 7 5 - 15    Comment: Performed at [facility] Lab, [address] [place], [place] [address]  Protime-INR     Status: Abnormal   Collection Time: 01/24/22  2:46 AM  Result Value Ref Range   Prothrombin Time 25.5 (H) 11.4 - 15.2 seconds   INR 2.3 (H) 0.8 - 1.2    Comment: (NOTE) INR goal varies based on device and disease states. Performed at [facility], [address] [place], [place] [address]    CT ABDOMEN PELVIS W CONTRAST  Result Date: 01/23/2022 CLINICAL DATA:  Pancreatitis with nausea and diarrhea. EXAM: CT ABDOMEN AND PELVIS WITH CONTRAST  TECHNIQUE: Multidetector CT imaging of the abdomen and pelvis was performed using the standard protocol following bolus administration of intravenous contrast. RADIATION DOSE REDUCTION: This exam was performed according to the departmental dose-optimization program which includes automated exposure control, adjustment of the mA and/or kV according to patient size and/or use of iterative reconstruction technique. CONTRAST:  OMNIPAQUE IOHEXOL 300 MG/ML  SOLN COMPARISON:  06/06/2020. FINDINGS: Lower chest: Mild atelectasis is noted at the lung bases. Hepatobiliary: No focal liver abnormality is seen. Hepatic steatosis is noted. No gallstones, gallbladder wall thickening, or biliary dilatation. Pancreas: There is peripancreatic fat stranding and a small amount of free fluid in the anterior pararenal spaces bilaterally and surrounding the tail of the pancreas, compatible with acute pancreatitis. No pancreatic ductal dilatation. Hypodensity is present in the head of the pancreas measuring 1.1 cm. Spleen: Normal in size without focal abnormality. Adrenals/Urinary Tract: No adrenal nodule or mass. The kidneys enhance symmetrically. No renal calculus or hydronephrosis. The bladder is unremarkable. Stomach/Bowel: The stomach is within normal limits. There is fat stranding about the proximal duodenum with mild bowel wall thickening, possible duodenitis. No bowel obstruction, free air, or pneumatosis. A normal appendix is seen in the right lower quadrant. There is a left inguinal hernia containing nonobstructed colon. There is fatty infiltration of the walls of the colon suggesting chronic inflammatory changes. Vascular/Lymphatic: The portal vein, superior mesenteric vein, and splenic vein are patent. No abdominal or pelvic lymphadenopathy. Reproductive: Prostate is unremarkable. Other: There is a prominent right inguinal hernia containing a prominent vessels and a small right hydrocele is noted. Small amount of ascites  is noted in the upper abdomen. Musculoskeletal: Sternotomy wires are present over the midline. No acute osseous abnormality is seen. IMPRESSION: 1. Findings compatible with acute pancreatitis and probable duodenitis. There is a hypodensity in the head of the pancreas measuring 1.1 cm, possible developing pseudocyst. 2. Hepatic steatosis. 3. Left inguinal hernia containing nonobstructed colon. 4. Right inguinal hernia containing prominent vasculature. 5. Small right hydrocele. Electronically Signed   By: Thornell Sartorius M.D.   On: 01/23/2022 04:35    Pending Labs Unresulted Labs (From admission, onward)     Start     Ordered   01/24/22 0500  Protime-INR  Daily at 5am,   R      01/23/22 1645            Vitals/Pain Today's  Vitals   01/24/22 0732 01/24/22 0902 01/24/22 1021 01/24/22 1218  BP:   133/82 129/83  Pulse:   90 99  Resp:   19 20  Temp:   98.6 F (37 C) 98.3 F (36.8 C)  TempSrc:   Oral Oral  SpO2:   98% 97%  Weight:      PainSc: Asleep 5   3     Isolation Precautions No active isolations  Medications Medications  sodium chloride flush (NS) 0.9 % injection 3 mL (3 mLs Intravenous Given 01/24/22 0909)  0.9 %  sodium chloride infusion ( Intravenous New Bag/Given 01/24/22 0443)  acetaminophen (TYLENOL) tablet 650 mg (650 mg Oral Given 01/24/22 1214)    Or  acetaminophen (TYLENOL) suppository 650 mg ( Rectal See Alternative 01/24/22 1214)  ondansetron (ZOFRAN) tablet 4 mg ( Oral See Alternative 01/24/22 0444)    Or  ondansetron (ZOFRAN) injection 4 mg (4 mg Intravenous Given 01/24/22 0444)  albuterol (PROVENTIL) (2.5 MG/3ML) 0.083% nebulizer solution 2.5 mg (has no administration in time range)  oxyCODONE (Oxy IR/ROXICODONE) immediate release tablet 5 mg (5 mg Oral Given 01/24/22 0901)  aspirin EC tablet 81 mg (81 mg Oral Given 01/24/22 0847)  metoprolol succinate (TOPROL-XL) 24 hr tablet 12.5 mg (12.5 mg Oral Given 01/24/22 0847)  loratadine (CLARITIN) tablet 10 mg (has no  administration in time range)  pantoprazole (PROTONIX) injection 40 mg (0 mg Intravenous Hold 01/24/22 1115)  warfarin (COUMADIN) tablet 6 mg (has no administration in time range)    And  warfarin (COUMADIN) tablet 4.5 mg (4.5 mg Oral Given 01/23/22 1829)  Warfarin - Pharmacist Dosing Inpatient (has no administration in time range)  ondansetron (ZOFRAN-ODT) disintegrating tablet 4 mg (4 mg Oral Given 01/22/22 2314)  lactated ringers bolus 1,000 mL (0 mLs Intravenous Stopped 01/22/22 2350)  morphine (PF) 4 MG/ML injection 4 mg (4 mg Intravenous Given 01/23/22 0122)  lactated ringers bolus 1,000 mL (0 mLs Intravenous Stopped 01/23/22 0130)  iohexol (OMNIPAQUE) 300 MG/ML solution 100 mL (100 mLs Intravenous Contrast Given 01/23/22 0351)  morphine (PF) 4 MG/ML injection 4 mg (4 mg Intravenous Given 01/23/22 0705)  morphine (PF) 2 MG/ML injection 2 mg (2 mg Intravenous Given 01/24/22 0444)    Mobility walks Low fall risk   Focused Assessments See notes   R Recommendations: See Admitting Provider Note  Report given to:   Additional Notes: see notes

## 2022-01-24 NOTE — Progress Notes (Signed)
ANTICOAGULATION CONSULT NOTE  Pharmacy Consult for warfarin  Indication: mechanical aortic valve   Allergies  Allergen Reactions   Amoxicillin Hives   Vital Signs: Temp: 98.7 F (37.1 C) (08/16 0732) Temp Source: Oral (08/16 0732) BP: 119/75 (08/16 0715) Pulse Rate: 98 (08/16 0715)  Labs: Recent Labs    01/22/22 2304 01/22/22 2313 01/23/22 1600 01/24/22 0246  HGB  --  13.8  --  12.0*  HCT  --  42.9  --  38.0*  PLT  --  361  --  236  LABPROT  --  21.4* 23.7* 25.5*  INR  --  1.9* 2.1* 2.3*  CREATININE 0.92  --   --  0.65     Estimated Creatinine Clearance: 142.5 mL/min (by C-G formula based on SCr of 0.65 mg/dL).  Medications:  Scheduled:   aspirin EC  81 mg Oral Daily   metoprolol succinate  12.5 mg Oral Daily   pantoprazole (PROTONIX) IV  40 mg Intravenous Q24H   sodium chloride flush  3 mL Intravenous Q12H   [START ON 01/26/2022] warfarin  6 mg Oral Once per day on Mon Fri   And   warfarin  4.5 mg Oral Once per day on Sun Tue Wed Thu Sat   Warfarin - Pharmacist Dosing Inpatient   Does not apply q1600    Assessment: 31 yom presented to the ED with abdomina pain. He is on chronic warfarin for history of a mechanical valve. INR remains therapeutic today at 2.3. No bleeding noted.   Goal of Therapy:  INR 2-3, per outpatient cardiology notes  Monitor platelets by anticoagulation protocol: Yes   Plan:  Continue home warfarin regimen of 6mg  Monday/Friday and 4.5mg  all other days Daily INR  , PharmD, BCPS, BCEMP Clinical Pharmacist Please see AMION for all pharmacy numbers 01/24/2022 8:56 AM

## 2022-01-24 NOTE — Progress Notes (Signed)
PROGRESS NOTE    [name] [name]  HER:[id number] DOB: [birth year] DOA: 01/22/2022 PCP: [name], NP   Brief Narrative: [name] [name] is a 31 y.o. 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  Patient presented secondary to abdominal pain and nausea with evidence of elevated lipase and pancreatic inflammation on imaging concerning for acute pancreatitis.  Patient started on bowel rest and analgesics.  He is improving quickly.   Assessment and Plan:  Acute pancreatitis Duodenitis Lipase of 2,957 on admission with associated epigastric/LUQ pain and CT imaging evidence of acute pancreatitis with associated duodenitis.  Question of developing pseudocyst on imaging.  Possibly secondary to recent alcohol use.  Normal triglycerides.  Recurrent issue. Patient continues to drink alcohol but significant cut down to a couple of drinks per week. Pain is improved while on a clear liquid diet. -Advance to a soft diet -Continue oxycodone PRN -Discontinue morphine IV  Leukocytosis Likely secondary to inflammation from acute pancreatitis.  Resolved.  History of bicuspid valve status post AVR with mechanical aortic valve placement Patient is managed on Coumadin with a goal INR of 2.5-3.5.  INR 1.9 on admission.  Coumadin restarted this admission with Lovenox bridge.  INR up to 2.3 today. -Continue Coumadin per pharmacy -Continue Lovenox bridge  Primary hypertension -Continue metoprolol succinate 12.5 mg daily  History of inguinal hernia Right inguinal hernia s/p repair.  CT abdomen pelvis on this admission significant for persistent left and right inguinal hernias.  No symptoms.  Patient will need outpatient follow-up.  Right hydrocele Asymptomatic.  DVT prophylaxis: Coumadin; Lovenox bridge Code Status:   Code Status: Full Code Family Communication: None at  bedside Disposition Plan: Discharge home in 24 hours if tolerating soft diet without requiring IV narcotics   Consultants:  None  Procedures:  None  Antimicrobials: None   Subjective: Patient reports improvement in pain this morning.  Last night he had some pain with ambulation which is improved this morning.  No nausea or vomiting.  Objective: BP 129/83   Pulse 99   Temp 98.3 F (36.8 C) (Oral)   Resp 20   Wt 89 kg   SpO2 97%   BMI 27.37 kg/m   Examination:  General exam: Appears calm and comfortable Respiratory system: Clear to auscultation. Respiratory effort normal. Cardiovascular system: S1 & S2 heard, RRR. No murmurs, rubs, gallops or clicks. Gastrointestinal system: Abdomen is nondistended, soft and tender in epigastric and left upper quadrants. No organomegaly or masses felt. Normal bowel sounds heard. Central nervous system: Alert and oriented. No focal neurological deficits. Musculoskeletal: No edema. No calf tenderness Skin: No cyanosis. No rashes Psychiatry: Judgement and insight appear normal. Mood & affect appropriate.    Data Reviewed: I have personally reviewed following labs and imaging studies  CBC Lab Results  Component Value Date   WBC 9.1 01/24/2022   RBC 4.35 01/24/2022   HGB 12.0 (L) 01/24/2022   HCT 38.0 (L) 01/24/2022   MCV 87.4 01/24/2022   MCH 27.6 01/24/2022   PLT 236 01/24/2022   MCHC 31.6 01/24/2022   RDW 14.6 01/24/2022   LYMPHSABS 2.3 01/22/2022   MONOABS 1.4 (H) 01/22/2022   EOSABS 0.3 01/22/2022   BASOSABS 0.0 01/22/2022     Last metabolic panel Lab Results  Component Value Date   NA 138 01/24/2022   K 3.5 01/24/2022   CL 106 01/24/2022   CO2 25 01/24/2022   BUN <  5 (L) 01/24/2022   CREATININE 0.65 01/24/2022   GLUCOSE 124 (H) 01/24/2022   GFRNONAA >60 01/24/2022   CALCIUM 8.5 (L) 01/24/2022   PROT 6.1 (L) 01/24/2022   ALBUMIN 3.5 01/24/2022   BILITOT 0.7 01/24/2022   ALKPHOS 44 01/24/2022   AST 16  01/24/2022   ALT 15 01/24/2022   ANIONGAP 7 01/24/2022    GFR: Estimated Creatinine Clearance: 142.5 mL/min (by C-G formula based on SCr of 0.65 mg/dL).  No results found for this or any previous visit (from the past 240 hour(s)).    Radiology Studies: CT ABDOMEN PELVIS W CONTRAST  Result Date: 01/23/2022 CLINICAL DATA:  Pancreatitis with nausea and diarrhea. EXAM: CT ABDOMEN AND PELVIS WITH CONTRAST TECHNIQUE: Multidetector CT imaging of the abdomen and pelvis was performed using the standard protocol following bolus administration of intravenous contrast. RADIATION DOSE REDUCTION: This exam was performed according to the departmental dose-optimization program which includes automated exposure control, adjustment of the mA and/or kV according to patient size and/or use of iterative reconstruction technique. CONTRAST:  OMNIPAQUE IOHEXOL 300 MG/ML  SOLN COMPARISON:  06/06/2020. FINDINGS: Lower chest: Mild atelectasis is noted at the lung bases. Hepatobiliary: No focal liver abnormality is seen. Hepatic steatosis is noted. No gallstones, gallbladder wall thickening, or biliary dilatation. Pancreas: There is peripancreatic fat stranding and a small amount of free fluid in the anterior pararenal spaces bilaterally and surrounding the tail of the pancreas, compatible with acute pancreatitis. No pancreatic ductal dilatation. Hypodensity is present in the head of the pancreas measuring 1.1 cm. Spleen: Normal in size without focal abnormality. Adrenals/Urinary Tract: No adrenal nodule or mass. The kidneys enhance symmetrically. No renal calculus or hydronephrosis. The bladder is unremarkable. Stomach/Bowel: The stomach is within normal limits. There is fat stranding about the proximal duodenum with mild bowel wall thickening, possible duodenitis. No bowel obstruction, free air, or pneumatosis. A normal appendix is seen in the right lower quadrant. There is a left inguinal hernia containing nonobstructed  colon. There is fatty infiltration of the walls of the colon suggesting chronic inflammatory changes. Vascular/Lymphatic: The portal vein, superior mesenteric vein, and splenic vein are patent. No abdominal or pelvic lymphadenopathy. Reproductive: Prostate is unremarkable. Other: There is a prominent right inguinal hernia containing a prominent vessels and a small right hydrocele is noted. Small amount of ascites is noted in the upper abdomen. Musculoskeletal: Sternotomy wires are present over the midline. No acute osseous abnormality is seen. IMPRESSION: 1. Findings compatible with acute pancreatitis and probable duodenitis. There is a hypodensity in the head of the pancreas measuring 1.1 cm, possible developing pseudocyst. 2. Hepatic steatosis. 3. Left inguinal hernia containing nonobstructed colon. 4. Right inguinal hernia containing prominent vasculature. 5. Small right hydrocele. Electronically Signed   By: Thornell Sartorius M.D.   On: 01/23/2022 04:35      LOS: 1 day    Jacquelin Hawking, MD [facility] 01/24/2022, 1:17 PM   If 7PM-7AM, please contact night-coverage [email or web address]

## 2022-01-24 NOTE — ED Notes (Signed)
Pt ambulated in hall with a steady gait.

## 2022-01-24 NOTE — Hospital Course (Signed)
[name] [name] is a 31 y.o. 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  Patient presented secondary to abdominal pain and nausea with evidence of elevated lipase and pancreatic inflammation on imaging concerning for acute pancreatitis.  Patient started on bowel rest and analgesics.  He is improving quickly.

## 2022-01-24 NOTE — ED Notes (Signed)
Patient ambulatory independently.

## 2022-01-25 DIAGNOSIS — K859 Acute pancreatitis without necrosis or infection, unspecified: Secondary | ICD-10-CM | POA: Diagnosis not present

## 2022-01-25 DIAGNOSIS — I10 Essential (primary) hypertension: Secondary | ICD-10-CM | POA: Diagnosis not present

## 2022-01-25 LAB — CBC
HCT: 36.3 % — ABNORMAL LOW (ref 39.0–52.0)
Hemoglobin: 11.7 g/dL — ABNORMAL LOW (ref 13.0–17.0)
MCH: 27.7 pg (ref 26.0–34.0)
MCHC: 32.2 g/dL (ref 30.0–36.0)
MCV: 85.8 fL (ref 80.0–100.0)
Platelets: 235 10*3/uL (ref 150–400)
RBC: 4.23 MIL/uL (ref 4.22–5.81)
RDW: 14.8 % (ref 11.5–15.5)
WBC: 7.1 10*3/uL (ref 4.0–10.5)
nRBC: 0 % (ref 0.0–0.2)

## 2022-01-25 LAB — PROTIME-INR
INR: 2.4 — ABNORMAL HIGH (ref 0.8–1.2)
Prothrombin Time: 25.8 seconds — ABNORMAL HIGH (ref 11.4–15.2)

## 2022-01-25 MED ORDER — LACTATED RINGERS IV BOLUS
1000.0000 mL | Freq: Once | INTRAVENOUS | Status: AC
Start: 2022-01-25 — End: 2022-01-25
  Administered 2022-01-25: 1000 mL via INTRAVENOUS

## 2022-01-25 MED ORDER — ACETAMINOPHEN 500 MG PO TABS: 500.0000 mg | ORAL_TABLET | Freq: Four times a day (QID) | ORAL | 0 refills | Status: DC | PRN

## 2022-01-25 NOTE — Discharge Summary (Signed)
Physician Discharge Summary   Patient: [name] [name] MRN: [id number] DOB: 1991/03/30  Admit date:     01/22/2022  Discharge date: 01/25/22  Discharge Physician: Jacquelin Hawking, MD   PCP: [name], NP   Recommendations at discharge:  Continue low fat diet Follow-up with PCP Abstain from alcohol Continue Lovenox bridge and follow-up with cardiology for INR check  Discharge Diagnoses: Principal Problem:   Pancreatitis Active Problems:   Leukocytosis   S/P AVR (aortic valve replacement) and aortoplasty   Subtherapeutic international normalized ratio (INR)   Essential hypertension   Abnormal CT of the abdomen  Resolved Problems:   * No resolved hospital problems. *  Hospital Course: [name] [name] is a 31 y.o. 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  Patient presented secondary to abdominal pain and nausea with evidence of elevated lipase and pancreatic inflammation on imaging concerning for acute pancreatitis.  Patient started on bowel rest and analgesics with quick improvement of symptoms. Patient with diarrheal illness which is shared with his partner after eating at [facility]. Mild fever prior to discharge without signs/symptoms to indicate worsening pancreatic disease.  Assessment and Plan:  Acute pancreatitis Duodenitis Lipase of 2,957 on admission with associated epigastric/LUQ pain and CT imaging evidence of acute pancreatitis with associated duodenitis.  Question of developing pseudocyst on imaging.  Possibly secondary to recent alcohol use.  Normal triglycerides.  Recurrent issue. Patient continues to drink alcohol but significant cut down to a couple of drinks per week. Diet advanced successfully.   Leukocytosis Likely secondary to inflammation from acute pancreatitis.  Resolved.   History of bicuspid valve status post AVR with mechanical  aortic valve placement Patient is managed on Coumadin with a goal INR of 2.5-3.5.  INR 1.9 on admission.  Coumadin restarted this admission with Lovenox bridge.  INR up to 2.3 today. -Continue Coumadin per pharmacy -Continue Lovenox bridge  Diarrhea Fever Likely related to GI pathogen rather than related to pancreatitis. Patient's partner with similar symptoms after both ingesting from the same restaurant. Patient abel to support diarrhea with oral intake.   Primary hypertension Continue metoprolol succinate 12.5 mg daily   History of inguinal hernia Right inguinal hernia s/p repair.  CT abdomen pelvis on this admission significant for persistent left and right inguinal hernias.  No symptoms. Patient will need outpatient follow-up.   Right hydrocele Asymptomatic.   Consultants: None Procedures performed: None  Disposition: Home Diet recommendation: Low fat   DISCHARGE MEDICATION: Allergies as of 01/25/2022       Reactions   Amoxicillin Hives        Medication List     STOP taking these medications    ferrous sulfate 325 (65 FE) MG EC tablet   oxyCODONE 5 MG immediate release tablet Commonly known as: Oxy IR/ROXICODONE       TAKE these medications    acetaminophen 500 MG tablet Commonly known as: TYLENOL Take 1 tablet (500 mg total) by mouth every 6 (six) hours as needed (pain). What changed:  how much to take when to take this   albuterol 108 (90 Base) MCG/ACT inhaler Commonly known as: VENTOLIN HFA Inhale 2 puffs into the lungs as needed for wheezing or shortness of breath.   aspirin EC 81 MG tablet Take 1 tablet (81 mg total) by mouth daily. Swallow whole.   enoxaparin 80 MG/0.8ML injection Commonly known as: Lovenox Inject 0.8 mLs (80 mg total)  into the skin every 12 (twelve) hours.   Fiber Powd Take 1 Scoop by mouth daily as needed (constipation).   loratadine 10 MG tablet Commonly known as: CLARITIN Take 10 mg by mouth daily.   metoprolol  succinate 25 MG 24 hr tablet Commonly known as: Toprol XL Take 0.5 tablets (12.5 mg total) by mouth at bedtime. What changed: when to take this   warfarin 3 MG tablet Commonly known as: COUMADIN Take as directed. If you are unsure how to take this medication, talk to your nurse or doctor. Original instructions: Take 1.5 tablets daily except 2 tablets on Fridays or as directed by Anticoagulation Clinic. What changed:  how much to take how to take this when to take this additional instructions        Follow-up Information     [name], NP. Schedule an appointment as soon as possible for a visit in 1 week(s).   Specialty: Family Medicine Why: For hospital follow-up Contact information: [address] [place] [address] [phone]         Orbie Pyo, MD .   Specialty: Cardiology Contact information: [address] [place] [address] [phone]                Discharge Exam: BP 126/82   Pulse (!) 106   Temp 99.7 F (37.6 C) (Oral)   Resp 20   Wt 89 kg   SpO2 95%   BMI 27.37 kg/m   General exam: Appears calm and comfortable Respiratory system: Clear to auscultation. Respiratory effort normal. Cardiovascular system: S1 & S2 heard. Tachycardia. Normal rhythm. No murmurs, rubs, gallops or clicks. Gastrointestinal system: Abdomen is non-distended, soft and non-tender. No organomegaly or masses felt. Normal bowel sounds heard. Central nervous system: Alert and oriented. No focal neurological deficits. Musculoskeletal: No edema. No calf tenderness Skin: No cyanosis. No rashes Psychiatry: Judgement and insight appear normal. Mood & affect appropriate.   Condition at discharge: stable  The results of significant diagnostics from this hospitalization (including imaging, microbiology, ancillary and laboratory) are listed below for reference.   Imaging Studies: CT ABDOMEN PELVIS W CONTRAST  Result Date:  01/23/2022 CLINICAL DATA:  Pancreatitis with nausea and diarrhea. EXAM: CT ABDOMEN AND PELVIS WITH CONTRAST TECHNIQUE: Multidetector CT imaging of the abdomen and pelvis was performed using the standard protocol following bolus administration of intravenous contrast. RADIATION DOSE REDUCTION: This exam was performed according to the departmental dose-optimization program which includes automated exposure control, adjustment of the mA and/or kV according to patient size and/or use of iterative reconstruction technique. CONTRAST:  OMNIPAQUE IOHEXOL 300 MG/ML  SOLN COMPARISON:  06/06/2020. FINDINGS: Lower chest: Mild atelectasis is noted at the lung bases. Hepatobiliary: No focal liver abnormality is seen. Hepatic steatosis is noted. No gallstones, gallbladder wall thickening, or biliary dilatation. Pancreas: There is peripancreatic fat stranding and a small amount of free fluid in the anterior pararenal spaces bilaterally and surrounding the tail of the pancreas, compatible with acute pancreatitis. No pancreatic ductal dilatation. Hypodensity is present in the head of the pancreas measuring 1.1 cm. Spleen: Normal in size without focal abnormality. Adrenals/Urinary Tract: No adrenal nodule or mass. The kidneys enhance symmetrically. No renal calculus or hydronephrosis. The bladder is unremarkable. Stomach/Bowel: The stomach is within normal limits. There is fat stranding about the proximal duodenum with mild bowel wall thickening, possible duodenitis. No bowel obstruction, free air, or pneumatosis. A normal appendix is seen in the right lower quadrant. There  is a left inguinal hernia containing nonobstructed colon. There is fatty infiltration of the walls of the colon suggesting chronic inflammatory changes. Vascular/Lymphatic: The portal vein, superior mesenteric vein, and splenic vein are patent. No abdominal or pelvic lymphadenopathy. Reproductive: Prostate is unremarkable. Other: There is a prominent right  inguinal hernia containing a prominent vessels and a small right hydrocele is noted. Small amount of ascites is noted in the upper abdomen. Musculoskeletal: Sternotomy wires are present over the midline. No acute osseous abnormality is seen. IMPRESSION: 1. Findings compatible with acute pancreatitis and probable duodenitis. There is a hypodensity in the head of the pancreas measuring 1.1 cm, possible developing pseudocyst. 2. Hepatic steatosis. 3. Left inguinal hernia containing nonobstructed colon. 4. Right inguinal hernia containing prominent vasculature. 5. Small right hydrocele. Electronically Signed   By: Thornell Sartorius M.D.   On: 01/23/2022 04:35    Microbiology: Results for orders placed or performed during the hospital encounter of 09/25/21  SARS CORONAVIRUS 2 (TAT 6-24 HRS) Nasopharyngeal Nasopharyngeal Swab     Status: None   Collection Time: 09/25/21  1:20 PM   Specimen: Nasopharyngeal Swab  Result Value Ref Range Status   SARS Coronavirus 2 NEGATIVE NEGATIVE Final    Comment: (NOTE) SARS-CoV-2 target nucleic acids are NOT DETECTED.  The SARS-CoV-2 RNA is generally detectable in upper and lower respiratory specimens during the acute phase of infection. Negative results do not preclude SARS-CoV-2 infection, do not rule out co-infections with other pathogens, and should not be used as the sole basis for treatment or other patient management decisions. Negative results must be combined with clinical observations, patient history, and epidemiological information. The expected result is Negative.  Fact Sheet for Patients: HairSlick.no  Fact Sheet for Healthcare Providers: quierodirigir.com  This test is not yet approved or cleared by the [place] FDA and  has been authorized for detection and/or diagnosis of SARS-CoV-2 by FDA under an Emergency Use Authorization (EUA). This EUA will remain  in effect (meaning this test can  be used) for the duration of the COVID-19 declaration under Se ction 564(b)(1) of the Act, 21 U.S.C. section 360bbb-3(b)(1), unless the authorization is terminated or revoked sooner.  Performed at [facility], [address], [place], [place] [address]   Surgical pcr screen     Status: Abnormal   Collection Time: 09/25/21  1:28 PM   Specimen: Nasal Mucosa; Nasal Swab  Result Value Ref Range Status   MRSA, PCR NEGATIVE NEGATIVE Final   Staphylococcus aureus POSITIVE (A) NEGATIVE Final    Comment: (NOTE) The Xpert SA Assay (FDA approved for NASAL specimens in patients 46 years of age and older), is one component of a comprehensive surveillance program. It is not intended to diagnose infection nor to guide or monitor treatment. Performed at [facility], [address] [place], [place] [address]     Labs: CBC: Recent Labs  Lab 01/22/22 2313 01/24/22 0246 01/25/22 0900  WBC 17.3* 9.1 7.1  NEUTROABS 13.2*  --   --   HGB 13.8 12.0* 11.7*  HCT 42.9 38.0* 36.3*  MCV 86.8 87.4 85.8  PLT 361 236 235   Basic Metabolic Panel: Recent Labs  Lab 01/22/22 2304 01/24/22 0246  NA 140 138  K 3.5 3.5  CL 105 106  CO2 22 25  GLUCOSE 159* 124*  BUN 14 <5*  CREATININE 0.92 0.65  CALCIUM 9.3 8.5*   Liver Function Tests: Recent Labs  Lab 01/22/22 2304 01/24/22 0246  AST  27 16  ALT 22 15  ALKPHOS 65 44  BILITOT 0.5 0.7  PROT 7.7 6.1*  ALBUMIN 4.5 3.5    Discharge time spent: 35 minutes.  Signed: Jacquelin Hawking, MD [facility] 01/25/2022

## 2022-01-25 NOTE — Progress Notes (Signed)
ANTICOAGULATION CONSULT NOTE  Pharmacy Consult for warfarin  Indication: mechanical aortic valve   Allergies  Allergen Reactions   Amoxicillin Hives   Vital Signs: Temp: 99.7 F (37.6 C) (08/17 0917) Temp Source: Oral (08/17 0917) BP: 126/82 (08/17 1019) Pulse Rate: 106 (08/17 1019)  Labs: Recent Labs    01/22/22 2304 01/22/22 2313 01/22/22 2313 01/23/22 1600 01/24/22 0246 01/25/22 0346 01/25/22 0900  HGB  --  13.8   < >  --  12.0*  --  11.7*  HCT  --  42.9  --   --  38.0*  --  36.3*  PLT  --  361  --   --  236  --  235  LABPROT  --  21.4*   < > 23.7* 25.5* 25.8*  --   INR  --  1.9*   < > 2.1* 2.3* 2.4*  --   CREATININE 0.92  --   --   --  0.65  --   --    < > = values in this interval not displayed.     Estimated Creatinine Clearance: 142.5 mL/min (by C-G formula based on SCr of 0.65 mg/dL).  Medications:  Scheduled:   aspirin EC  81 mg Oral Daily   metoprolol succinate  12.5 mg Oral Daily   pantoprazole (PROTONIX) IV  40 mg Intravenous Q24H   sodium chloride flush  3 mL Intravenous Q12H   [START ON 01/26/2022] warfarin  6 mg Oral Once per day on Mon Fri   And   warfarin  4.5 mg Oral Once per day on Sun Tue Wed Thu Sat   Warfarin - Pharmacist Dosing Inpatient   Does not apply q1600    Assessment: 31 yom presented to the ED with abdomina pain. He is on chronic warfarin for history of a mechanical valve. INR remains therapeutic today at 2.4. No bleeding noted.   Goal of Therapy:  INR 2-3, per outpatient cardiology notes  Monitor platelets by anticoagulation protocol: Yes   Plan:  Continue home warfarin regimen of 6mg  Monday/Friday and 4.5mg  all other days.  Warfarin 4.5 mg due today 01/25/22. Daily INR  01/27/22, Noah Delaine Clinical Pharmacist [phone] Please see AMION for all pharmacy numbers 01/25/2022 12:04 PM

## 2022-01-25 NOTE — Progress Notes (Signed)
  Transition of Care (TOC) Screening Note   Patient Details  Name: [name] [name] Date of Birth: 1991/02/17   Transition of Care [facility]) CM/SW Contact:    Harriet Masson, RN Phone Number: 01/25/2022, 7:55 AM    Transition of Care Department [facility]) has reviewed patient and no TOC needs have been identified at this time. We will continue to monitor patient advancement through interdisciplinary progression rounds. If new patient transition needs arise, please place a TOC consult.

## 2022-01-25 NOTE — Progress Notes (Signed)
Patient is alert and oriented x4, exited unit in no distress with all belongings via wheelchair accompanied by transport.

## 2022-01-26 ENCOUNTER — Ambulatory Visit (INDEPENDENT_AMBULATORY_CARE_PROVIDER_SITE_OTHER): Payer: Commercial Managed Care - PPO | Admitting: *Deleted

## 2022-01-26 DIAGNOSIS — Z952 Presence of prosthetic heart valve: Secondary | ICD-10-CM | POA: Diagnosis not present

## 2022-01-26 DIAGNOSIS — Z5181 Encounter for therapeutic drug level monitoring: Secondary | ICD-10-CM | POA: Diagnosis not present

## 2022-01-26 LAB — POCT INR: INR: 2.7 (ref 2.0–3.0)

## 2022-01-26 NOTE — Patient Instructions (Signed)
Description   Stop lovenox. Hold warfarin today and then continue to take warfarin 4.5mg  daily except for 6mg  on Monday, Wednesday and Friday. Start eating 5 serving of greens weekly. Coumadin Clinic [phone]

## 2022-02-05 ENCOUNTER — Ambulatory Visit: Payer: Commercial Managed Care - PPO | Attending: Cardiovascular Disease

## 2022-02-05 DIAGNOSIS — Z952 Presence of prosthetic heart valve: Secondary | ICD-10-CM

## 2022-02-05 DIAGNOSIS — Z7901 Long term (current) use of anticoagulants: Secondary | ICD-10-CM | POA: Diagnosis not present

## 2022-02-05 LAB — POCT INR: INR: 2 (ref 2.0–3.0)

## 2022-02-05 NOTE — Patient Instructions (Signed)
continue to take warfarin 4.5mg  daily except for 6mg  on Monday, Wednesday and Friday. Start eating 5 serving of greens weekly. Coumadin Clinic [phone]; INR in 6 weeks

## 2022-02-21 ENCOUNTER — Ambulatory Visit: Payer: Commercial Managed Care - PPO | Admitting: Internal Medicine

## 2022-03-05 ENCOUNTER — Encounter: Payer: Self-pay | Admitting: *Deleted

## 2022-03-11 ENCOUNTER — Other Ambulatory Visit: Payer: Self-pay | Admitting: Internal Medicine

## 2022-03-12 NOTE — Telephone Encounter (Signed)
Refill request for warfarin:  Last INR was 2.0 on 02/05/22 Next INR due 03/19/22 LOV was 10/16/21  Alfredo Martinez MD  Refill approved.

## 2022-03-19 ENCOUNTER — Ambulatory Visit: Payer: Commercial Managed Care - PPO | Attending: Cardiovascular Disease

## 2022-03-19 DIAGNOSIS — Z7901 Long term (current) use of anticoagulants: Secondary | ICD-10-CM

## 2022-03-19 DIAGNOSIS — Z952 Presence of prosthetic heart valve: Secondary | ICD-10-CM

## 2022-03-19 LAB — POCT INR: INR: 3.4 — AB (ref 2.0–3.0)

## 2022-03-19 NOTE — Patient Instructions (Signed)
HOLD TODAY ONLY and then continue to take warfarin 4.5mg  daily except for 6mg  on Monday, Wednesday and Friday. Start eating 5 serving of greens weekly. Coumadin Clinic [phone]; INR in 3 weeks

## 2022-04-09 ENCOUNTER — Ambulatory Visit: Payer: Commercial Managed Care - PPO | Attending: Cardiovascular Disease

## 2022-04-09 DIAGNOSIS — Z7901 Long term (current) use of anticoagulants: Secondary | ICD-10-CM | POA: Diagnosis not present

## 2022-04-09 DIAGNOSIS — Z952 Presence of prosthetic heart valve: Secondary | ICD-10-CM

## 2022-04-09 LAB — POCT INR: INR: 3.9 — AB (ref 2.0–3.0)

## 2022-04-09 NOTE — Patient Instructions (Signed)
Description   HOLD TODAY ONLY and eat greens and then START taking warfarin 4.5mg  daily except for 6mg  on Mondays.  Continue eating 5 serving of greens weekly.  Coumadin Clinic [phone]; INR in 2 weeks

## 2022-04-23 ENCOUNTER — Ambulatory Visit: Payer: Commercial Managed Care - PPO | Attending: Cardiology

## 2022-04-23 DIAGNOSIS — Z952 Presence of prosthetic heart valve: Secondary | ICD-10-CM | POA: Diagnosis not present

## 2022-04-23 DIAGNOSIS — Z7901 Long term (current) use of anticoagulants: Secondary | ICD-10-CM

## 2022-04-23 LAB — POCT INR: INR: 1.1 — AB (ref 2.0–3.0)

## 2022-04-23 NOTE — Patient Instructions (Signed)
TAKE 2.5 TABLETS then Continue taking warfarin 4.5mg  daily except for 6mg  on Mondays.  Continue eating 5 serving of greens weekly.  Coumadin Clinic [phone]; INR in 2 weeks

## 2022-05-07 ENCOUNTER — Ambulatory Visit: Payer: Commercial Managed Care - PPO

## 2022-05-07 ENCOUNTER — Telehealth: Payer: Self-pay | Admitting: *Deleted

## 2022-05-07 NOTE — Telephone Encounter (Signed)
Called pt since he missed appt this morning. There was no answer so left left a message to call back to reschedule. Will await.

## 2022-05-09 ENCOUNTER — Encounter (HOSPITAL_COMMUNITY): Payer: Self-pay | Admitting: Emergency Medicine

## 2022-05-09 ENCOUNTER — Ambulatory Visit (HOSPITAL_COMMUNITY)
Admission: EM | Admit: 2022-05-09 | Discharge: 2022-05-09 | Disposition: A | Discharge status: Home / Self Care | Payer: Commercial Managed Care - PPO | Attending: Family Medicine | Admitting: Family Medicine

## 2022-05-09 DIAGNOSIS — J069 Acute upper respiratory infection, unspecified: Secondary | ICD-10-CM | POA: Diagnosis present

## 2022-05-09 DIAGNOSIS — Z1152 Encounter for screening for COVID-19: Secondary | ICD-10-CM | POA: Insufficient documentation

## 2022-05-09 DIAGNOSIS — Z952 Presence of prosthetic heart valve: Secondary | ICD-10-CM | POA: Diagnosis not present

## 2022-05-09 LAB — POCT RAPID STREP A, ED / UC: Streptococcus, Group A Screen (Direct): NEGATIVE

## 2022-05-09 NOTE — ED Provider Notes (Signed)
[facility]   [phone] 05/09/22 Arrival Time: 1130  ASSESSMENT & PLAN:  1. Viral upper respiratory tract infection    -History and exam is consistent with a viral upper respiratory tract infection.  His point-of-care strep testing today was negative.  I have high suspicion for COVID.  He was COVID tested today as well.  We will call him if the results are positive.  I recommended supportive care with over-the-counter medicine such as Tylenol and ibuprofen for aches, Mucinex, Robitussin, DayQuil/NyQuil for cough and congestion.  Recommended vigorous p.o. hydration and rest.  He can return to work as long as he is fever free for 24 hours.  All questions answered and agrees to plan.  No orders of the defined types were placed in this encounter.  Discharge Instructions   None       Reviewed expectations re: course of current medical issues. Questions answered. Outlined signs and symptoms indicating need for more acute intervention. Patient verbalized understanding. After Visit Summary given.   SUBJECTIVE: Pleasant 31 year old male with past medical history of aortic valve replacement here for evaluation of sore throat.  Has had a sore throat and hoarse voice since Monday.  Has also had a dry nonproductive cough.  Worse at night.  Has not tried taking any over-the-counter medicines.  Denies any fevers, fatigue, body aches or shortness of breath.  He was exposed to a coworker that has strep throat.  His job wanted him to come in to get tested before he could return back to work.  Denies chest pain, shortness of breath, abdominal pain.  No LMP for male patient. Past Surgical History:  Procedure Laterality Date   AORTIC VALVE REPLACEMENT N/A 09/27/2021   Procedure: AORTIC VALVE REPLACEMENT (AVR) USING ON-X AORTIC VALVE SIZE ;  Surgeon: Loreli Slot, MD;  Location: [facility] OR;  Service: Open Heart Surgery;  Laterality: N/A;   INGUINAL HERNIA REPAIR Right 01/05/2022    Procedure: OPEN REPAIR INCARCERATED RIGHT INGUINAL HERNIA WITH MESH;  Surgeon: Gaynelle Adu, MD;  Location: [facility] ORS;  Service: General;  Laterality: Right;  90 MIN - ROOM 2   REPLACEMENT ASCENDING AORTA N/A 09/27/2021   Procedure: REPLACEMENT ASCENDING AND INNOMINATE ANEURYSM USING HEMASHIELD PLATINUM [id number];  Surgeon: Loreli Slot, MD;  Location: [facility] OR;  Service: Open Heart Surgery;  Laterality: N/A;   RIGHT HEART CATH AND CORONARY ANGIOGRAPHY N/A 09/07/2021   Procedure: RIGHT HEART CATH AND CORONARY ANGIOGRAPHY;  Surgeon: Orbie Pyo, MD;  Location: [facility] INVASIVE CV LAB;  Service: Cardiovascular;  Laterality: N/A;   TEE WITHOUT CARDIOVERSION N/A 09/27/2021   Procedure: TRANSESOPHAGEAL ECHOCARDIOGRAM (TEE);  Surgeon: Loreli Slot, MD;  Location: [facility] OR;  Service: Open Heart Surgery;  Laterality: N/A;     OBJECTIVE:  Vitals:   05/09/22 1254  BP: 135/82  Pulse: 65  Resp: 16  Temp: 97.6 F (36.4 C)  TempSrc: Oral  SpO2: 98%     Physical Exam Vitals reviewed.  HENT:     Head: Normocephalic.     Right Ear: Tympanic membrane normal.     Left Ear: Tympanic membrane normal.     Nose: Congestion present.     Mouth/Throat:     Mouth: Mucous membranes are moist.     Pharynx: Posterior oropharyngeal erythema present. No oropharyngeal exudate.     Tonsils: No tonsillar exudate.  Cardiovascular:     Rate and Rhythm: Normal rate.  Pulmonary:     Effort: Pulmonary effort is normal.  Lymphadenopathy:  Cervical: Cervical adenopathy present.      Labs: Results for orders placed or performed during the hospital encounter of 05/09/22  POCT Rapid Strep A  Result Value Ref Range   Streptococcus, Group A Screen (Direct) NEGATIVE NEGATIVE   Labs Reviewed  SARS CORONAVIRUS 2 (TAT 6-24 HRS)  POCT RAPID STREP A, ED / UC    Imaging: No results found.   Allergies  Allergen Reactions   Amoxicillin Hives                                               Past  Medical History:  Diagnosis Date   Asthma    Headache    migraines   Heart murmur    Mild aortic stenosis    Pancreatitis    Pneumonia    as a child x several   Substance abuse (HCC)     Social History   Socioeconomic History   Marital status: Single    Spouse name: Not on file   Number of children: Not on file   Years of education: Not on file   Highest education level: Not on file  Occupational History   Occupation: Bartender    Comment: [employer]  Tobacco Use   Smoking status: Never   Smokeless tobacco: Never  Vaping Use   Vaping Use: Never used  Substance and Sexual Activity   Alcohol use: Yes    Comment: 10 drinks a week wine   Drug use: Not Currently    Types: Other-see comments    Comment: Edibles occasionally marijuana   Sexual activity: Yes  Other Topics Concern   Not on file  Social History Narrative   Not on file   Social Determinants of Health   Financial Resource Strain: Not on file  Food Insecurity: Not on file  Transportation Needs: Not on file  Physical Activity: Not on file  Stress: Not on file  Social Connections: Not on file  Intimate Partner Violence: Not on file    Family History  Problem Relation Age of Onset   Pancreatitis Maternal Uncle       Megham Dwyer, Baldemar Friday, MD 05/09/22 1339

## 2022-05-09 NOTE — ED Triage Notes (Signed)
Pt c/o sore throat since Monday. Reports becomes hoarse voiced by ended of day.  Reports strep throat going around his job.

## 2022-05-10 LAB — SARS CORONAVIRUS 2 (TAT 6-24 HRS): SARS Coronavirus 2: NEGATIVE

## 2022-05-16 ENCOUNTER — Ambulatory Visit: Payer: Commercial Managed Care - PPO | Attending: Cardiovascular Disease | Admitting: *Deleted

## 2022-05-16 DIAGNOSIS — Z7901 Long term (current) use of anticoagulants: Secondary | ICD-10-CM

## 2022-05-16 DIAGNOSIS — Z952 Presence of prosthetic heart valve: Secondary | ICD-10-CM

## 2022-05-16 LAB — POCT INR: INR: 1.4 — AB (ref 2.0–3.0)

## 2022-05-16 NOTE — Patient Instructions (Addendum)
Description   TAKE 6mg  (2 TABLETS) then continue taking warfarin 4.5mg  daily except for 6mg  on Mondays.  Continue eating 5 serving of greens weekly. Coumadin Clinic [phone]; INR in 3 weeks

## 2022-05-24 ENCOUNTER — Encounter: Payer: Self-pay | Admitting: *Deleted

## 2022-06-06 ENCOUNTER — Ambulatory Visit: Payer: Commercial Managed Care - PPO

## 2022-06-18 ENCOUNTER — Ambulatory Visit: Payer: Commercial Managed Care - PPO | Attending: Cardiology

## 2022-06-18 DIAGNOSIS — Z952 Presence of prosthetic heart valve: Secondary | ICD-10-CM

## 2022-06-18 DIAGNOSIS — Z5181 Encounter for therapeutic drug level monitoring: Secondary | ICD-10-CM | POA: Diagnosis not present

## 2022-06-18 DIAGNOSIS — Z7901 Long term (current) use of anticoagulants: Secondary | ICD-10-CM | POA: Diagnosis not present

## 2022-06-18 LAB — POCT INR: INR: 1.3 — AB (ref 2.0–3.0)

## 2022-06-18 NOTE — Patient Instructions (Signed)
TAKE 3 TABLETS TODAY ONLY THEN  continue taking warfarin 4.5mg  daily except for 6mg  on Mondays.  Continue eating 5 serving of greens weekly. Coumadin Clinic [phone]; INR in 3 weeks

## 2022-07-09 ENCOUNTER — Ambulatory Visit: Payer: Commercial Managed Care - PPO | Attending: Cardiovascular Disease

## 2022-07-09 DIAGNOSIS — Z952 Presence of prosthetic heart valve: Secondary | ICD-10-CM

## 2022-07-09 DIAGNOSIS — Z5181 Encounter for therapeutic drug level monitoring: Secondary | ICD-10-CM | POA: Diagnosis not present

## 2022-07-09 DIAGNOSIS — Z7901 Long term (current) use of anticoagulants: Secondary | ICD-10-CM | POA: Diagnosis not present

## 2022-07-09 LAB — POCT INR: INR: 1.6 — AB (ref 2.0–3.0)

## 2022-07-09 NOTE — Patient Instructions (Signed)
continue taking warfarin 4.5mg  daily except for 6mg  on Mondays.  Continue eating 5 serving of greens weekly. Coumadin Clinic [phone]; INR in 6 weeks

## 2022-07-13 ENCOUNTER — Other Ambulatory Visit: Payer: Self-pay | Admitting: Internal Medicine

## 2022-08-03 ENCOUNTER — Ambulatory Visit (INDEPENDENT_AMBULATORY_CARE_PROVIDER_SITE_OTHER): Payer: Commercial Managed Care - PPO | Admitting: Family

## 2022-08-03 ENCOUNTER — Encounter: Payer: Self-pay | Admitting: Family

## 2022-08-03 VITALS — BP 134/74 | HR 44 | Temp 98.6°F | Ht 71.0 in | Wt 193.4 lb

## 2022-08-03 DIAGNOSIS — K4090 Unilateral inguinal hernia, without obstruction or gangrene, not specified as recurrent: Secondary | ICD-10-CM | POA: Diagnosis not present

## 2022-08-03 DIAGNOSIS — Z0000 Encounter for general adult medical examination without abnormal findings: Secondary | ICD-10-CM | POA: Diagnosis not present

## 2022-08-03 LAB — COMPREHENSIVE METABOLIC PANEL
ALT: 17 U/L (ref 0–53)
AST: 19 U/L (ref 0–37)
Albumin: 4.6 g/dL (ref 3.5–5.2)
Alkaline Phosphatase: 41 U/L (ref 39–117)
BUN: 12 mg/dL (ref 6–23)
CO2: 28 mEq/L (ref 19–32)
Calcium: 9.5 mg/dL (ref 8.4–10.5)
Chloride: 106 mEq/L (ref 96–112)
Creatinine, Ser: 0.83 mg/dL (ref 0.40–1.50)
GFR: 116.59 mL/min (ref >60.00)
Glucose, Bld: 105 mg/dL — ABNORMAL HIGH (ref 70–99)
Potassium: 4.3 mEq/L (ref 3.5–5.1)
Sodium: 141 mEq/L (ref 135–145)
Total Bilirubin: 0.4 mg/dL (ref 0.2–1.2)
Total Protein: 6.9 g/dL (ref 6.0–8.3)

## 2022-08-03 LAB — CBC WITH DIFFERENTIAL/PLATELET
Basophils Absolute: 0 10*3/uL (ref 0.0–0.1)
Basophils Relative: 0.5 % (ref 0.0–3.0)
Eosinophils Absolute: 0.2 10*3/uL (ref 0.0–0.7)
Eosinophils Relative: 3.4 % (ref 0.0–5.0)
HCT: 40.1 % (ref 39.0–52.0)
Hemoglobin: 13.7 g/dL (ref 13.0–17.0)
Lymphocytes Relative: 37.5 % (ref 12.0–46.0)
Lymphs Abs: 1.7 10*3/uL (ref 0.7–4.0)
MCHC: 34.2 g/dL (ref 30.0–36.0)
MCV: 91.8 fl (ref 78.0–100.0)
Monocytes Absolute: 0.5 10*3/uL (ref 0.1–1.0)
Monocytes Relative: 10.6 % (ref 3.0–12.0)
Neutro Abs: 2.2 10*3/uL (ref 1.4–7.7)
Neutrophils Relative %: 48 % (ref 43.0–77.0)
Platelets: 282 10*3/uL (ref 150.0–400.0)
RBC: 4.37 Mil/uL (ref 4.22–5.81)
RDW: 13.8 % (ref 11.5–15.5)
WBC: 4.5 10*3/uL (ref 4.0–10.5)

## 2022-08-03 LAB — LIPID PANEL
Cholesterol: 163 mg/dL (ref 0–200)
HDL: 50 mg/dL (ref >39.00)
LDL Cholesterol: 98 mg/dL (ref 0–99)
NonHDL: 113.23
Total CHOL/HDL Ratio: 3
Triglycerides: 77 mg/dL (ref 0.0–149.0)
VLDL: 15.4 mg/dL (ref 0.0–40.0)

## 2022-08-03 LAB — TSH: TSH: 1.42 u[IU]/mL (ref 0.35–5.50)

## 2022-08-03 NOTE — Progress Notes (Signed)
Phone: [phone]  Subjective:  Patient 32 y.o. male presenting for annual physical.  Chief Complaint  Patient presents with   Annual Exam    Pt has no questions or concerns. Pt is not fasting     See problem oriented charting- ROS- full  review of systems was completed and negative    The following were reviewed and entered/updated in epic: Past Medical History:  Diagnosis Date   Asthma    Headache    migraines   Heart murmur    Hypokalemia 06/08/2020   Leukocytosis 01/23/2022   Mild aortic stenosis    Pancreatitis    Pneumonia    as a child x several   Substance abuse ([place])    Patient Active Problem List   Diagnosis Date Noted   Pancreatitis 01/23/2022   Subtherapeutic international normalized ratio (INR) 01/23/2022   Essential hypertension 01/23/2022   Abnormal CT of the abdomen 01/23/2022   Long term (current) use of anticoagulants 10/04/2021   S/P AVR (aortic valve replacement) and aortoplasty 09/27/2021   Unilateral inguinal hernia without obstruction or gangrene 07/28/2021   Transaminitis 06/08/2020   Aortic valve calcification 06/08/2020   Acute pancreatitis 06/06/2020   Mild aortic stenosis 11/01/2016   Bicuspid aortic valve 11/01/2016   Past Surgical History:  Procedure Laterality Date   AORTIC VALVE REPLACEMENT N/A 09/27/2021   Procedure: AORTIC VALVE REPLACEMENT (AVR) USING ON-X AORTIC VALVE SIZE 23MM;  Surgeon: [name], MD;  Location: [facility];  Service: Open Heart Surgery;  Laterality: N/A;   INGUINAL HERNIA REPAIR Right 01/05/2022   Procedure: OPEN REPAIR INCARCERATED RIGHT INGUINAL HERNIA WITH MESH;  Surgeon: Greer Pickerel, MD;  Location: [facility];  Service: General;  Laterality: Right;  90 MIN - ROOM 2   REPLACEMENT ASCENDING AORTA N/A 09/27/2021   Procedure: REPLACEMENT ASCENDING AND INNOMINATE ANEURYSM USING HEMASHIELD PLATINUM [id number];  Surgeon: [name], MD;  Location: [facility];  Service: Open Heart Surgery;   Laterality: N/A;   RIGHT HEART CATH AND CORONARY ANGIOGRAPHY N/A 09/07/2021   Procedure: RIGHT HEART CATH AND CORONARY ANGIOGRAPHY;  Surgeon: Early Osmond, MD;  Location: [facility];  Service: Cardiovascular;  Laterality: N/A;   TEE WITHOUT CARDIOVERSION N/A 09/27/2021   Procedure: TRANSESOPHAGEAL ECHOCARDIOGRAM (TEE);  Surgeon: [name], MD;  Location: [facility];  Service: Open Heart Surgery;  Laterality: N/A;    Family History  Problem Relation Age of Onset   Pancreatitis Maternal Uncle     Medications- reviewed and updated Current Outpatient Medications  Medication Sig Dispense Refill   acetaminophen (TYLENOL) 500 MG tablet Take 1 tablet (500 mg total) by mouth every 6 (six) hours as needed (pain). 30 tablet 0   albuterol (VENTOLIN HFA) 108 (90 Base) MCG/ACT inhaler Inhale 2 puffs into the lungs as needed for wheezing or shortness of breath.     aspirin EC 81 MG EC tablet Take 1 tablet (81 mg total) by mouth daily. Swallow whole. 30 tablet 11   loratadine (CLARITIN) 10 MG tablet Take 10 mg by mouth daily.     metoprolol succinate (TOPROL XL) 25 MG 24 hr tablet Take 0.5 tablets (12.5 mg total) by mouth at bedtime. (Patient taking differently: Take 12.5 mg by mouth daily.) 45 tablet 3   warfarin (COUMADIN) 3 MG tablet TAKE 1 AND 1/2 TABLETS BY MOUTH DAILY. EXCEPT TAKE 2 TABLETS ON MONDAYS, WEDNESDAYS AND FRIDAYS OR AS DIRECTED BY COAGULATION CLINIC 45 tablet 3   No current facility-administered medications for  this visit.    Allergies-reviewed and updated Allergies  Allergen Reactions   Amoxicillin Hives    Social History   Social History Narrative   Not on file    Objective:  BP 134/74 (BP Location: Left Arm, Patient Position: Sitting)   Pulse (!) 44   Temp 98.6 F (37 C) (Temporal)   Ht '5\' 11"'$  (1.803 m)   Wt 193 lb 6.4 oz (87.7 kg)   SpO2 100%   BMI 26.97 kg/m  Physical Exam Vitals and nursing note reviewed.  Constitutional:      General: He is  not in acute distress.    Appearance: Normal appearance.  HENT:     Head: Normocephalic.     Right Ear: Tympanic membrane and external ear normal.     Left Ear: Tympanic membrane and external ear normal.     Nose: Nose normal.     Mouth/Throat:     Mouth: Mucous membranes are moist.  Eyes:     Extraocular Movements: Extraocular movements intact.  Cardiovascular:     Rate and Rhythm: Normal rate and regular rhythm.  Pulmonary:     Effort: Pulmonary effort is normal.     Breath sounds: Normal breath sounds.  Abdominal:     General: Abdomen is flat. There is no distension.     Palpations: Abdomen is soft.     Tenderness: There is no abdominal tenderness.     Hernia: A hernia is present. Hernia is present in the left inguinal area.  Musculoskeletal:        General: Normal range of motion.     Cervical back: Normal range of motion.  Skin:    General: Skin is warm and dry.  Neurological:     Mental Status: He is alert and oriented to person, place, and time.  Psychiatric:        Mood and Affect: Mood normal.        Behavior: Behavior normal.        Judgment: Judgment normal.     Assessment and Plan   Health Maintenance counseling: 1. Anticipatory guidance: Patient counseled regarding regular dental exams q6 months, eye exams yearly, avoiding smoking and second hand smoke, limiting alcohol to 2 beverages per day.   2. Risk factor reduction:  Advised patient of need for regular exercise and diet rich in fruits and vegetables to reduce risk of heart attack and stroke.    Wt Readings from Last 3 Encounters:  08/03/22 193 lb 6.4 oz (87.7 kg)  01/23/22 196 lb 3.4 oz (89 kg)  01/05/22 197 lb 1.5 oz (89.4 kg)   3. Immunizations/screenings/ancillary studies Immunization History  Administered Date(s) Administered   Influenza,inj,Quad PF,6+ Mos 07/28/2021   Influenza-Unspecified 03/12/2019   Health Maintenance Due  Topic Date Due   COVID-19 Vaccine (1) Never done   Hepatitis C  Screening  Never done   DTaP/Tdap/Td (1 - Tdap) Never done   INFLUENZA VACCINE  01/09/2022    4. Skin cancer screening-  advised regular sunscreen use. Denies worrisome, changing, or new skin lesions.  5. Smoking associated screening: non- smoker  6. STD screening - N/A 7. Alcohol screening: none   Problem List Items Addressed This Visit       Other   Unilateral inguinal hernia without obstruction or gangrene    left side seen a year ago for right side, put off sgy due to having urgent heart valve surgery - did end up having repair in late July 2023 now  reports he has another one on his left side does not need repair currently, this one is not as big as his last and not descending into his scrotum he has been on restrictions regarding lifting which has helped limit his sx advised to let me know if new referral needed      Other Visit Diagnoses     Annual physical exam    -  Primary   Relevant Orders   Comprehensive metabolic panel   TSH   Lipid panel   CBC with Differential/Platelet      Recommended follow up: No follow-ups on file. Future Appointments  Date Time Provider [place]  08/20/2022 11:00 AM [facility] None   Lab/Order associations:  non-fasting   Jeanie Sewer, NP

## 2022-08-03 NOTE — Assessment & Plan Note (Addendum)
left side seen a year ago for right side, put off sgy due to having urgent heart valve surgery - did end up having repair in late July 2023 now reports he has another one on his left side does not need repair currently, this one is not as big as his last and not descending into his scrotum he has been on restrictions regarding lifting which has helped limit his sx advised to let me know if new referral needed

## 2022-08-03 NOTE — Patient Instructions (Signed)
Welcome to [facility], It was a pleasure meeting you today!   I will review your lab results via MyChart in a few days.  You look great! Have a great weekend!    PLEASE NOTE: If you had any LAB tests please let us know if you have not heard back within a few days. You may see your results on MyChart before we have a chance to review them but we will give you a call once they are reviewed by [place]. If we ordered any REFERRALS today, please let us know if you have not heard from their office within the next week.  Let us know through MyChart if you are needing REFILLS, or have your pharmacy send [place] the request. You can also use MyChart to communicate with me or any office staff.  Please try these tips to maintain a healthy lifestyle: It is important that you exercise regularly at least 30 minutes 5 times a week. Think about what you will eat, plan ahead. Choose whole foods, & think  "clean, green, fresh or frozen" over canned, processed or packaged foods which are more sugary, salty, and fatty. 70 to 75% of food eaten should be fresh vegetables and protein. 2-3  meals daily with healthy snacks between meals, but must be whole fruit, protein or vegetables. Aim to eat over a 10 hour period when you are active, for example, 7am to 5pm, and then STOP after your last meal of the day, drinking only water.  Shorter eating windows, 6-8 hours, are showing benefits in heart disease and blood sugar regulation. Drink water every day! Shoot for 64 ounces daily = 8 cups, no other drink is as healthy! Fruit juice is best enjoyed in a healthy way, by EATING the fruit.

## 2022-08-07 NOTE — Progress Notes (Signed)
All of your labs are within normal range.   Glucose (sugar is high but normal since not fasting. Your electrolytes, kidney and liver function, blood count, thyroid, and cholesterol numbers are all good.   Keep up the good work with controlling your diet and continue to try and shoot for 30 minutes of exercise daily!

## 2022-08-20 ENCOUNTER — Ambulatory Visit: Payer: Commercial Managed Care - PPO | Attending: Internal Medicine | Admitting: *Deleted

## 2022-08-20 DIAGNOSIS — Z952 Presence of prosthetic heart valve: Secondary | ICD-10-CM | POA: Diagnosis not present

## 2022-08-20 DIAGNOSIS — Z5181 Encounter for therapeutic drug level monitoring: Secondary | ICD-10-CM | POA: Diagnosis not present

## 2022-08-20 LAB — POCT INR: POC INR: 1.8

## 2022-08-20 NOTE — Patient Instructions (Signed)
Description   continue taking warfarin 4.'5mg'$  daily except for '6mg'$  on Mondays.  Continue eating 5 serving of greens weekly. [facility] [phone]; INR in 6 weeks

## 2022-09-26 ENCOUNTER — Telehealth: Payer: Self-pay | Admitting: *Deleted

## 2022-09-26 NOTE — Telephone Encounter (Signed)
Called pt since need to reschedule his 4/22 at 1015am appt to another date; there was no answer therefore left a message for him to call back regarding this. Will await.

## 2022-10-01 ENCOUNTER — Ambulatory Visit: Payer: Commercial Managed Care - PPO | Attending: Cardiovascular Disease | Admitting: Pharmacist

## 2022-10-01 DIAGNOSIS — Z7901 Long term (current) use of anticoagulants: Secondary | ICD-10-CM

## 2022-10-01 DIAGNOSIS — Z952 Presence of prosthetic heart valve: Secondary | ICD-10-CM

## 2022-10-01 LAB — POCT INR: INR: 1.9 — AB (ref 2.0–3.0)

## 2022-10-01 NOTE — Patient Instructions (Signed)
Description   Continue taking warfarin 4.5mg  daily except for  on Mondays.  Continue eating 5 serving of greens weekly. [facility] [phone]; INR in 6 weeks

## 2022-10-17 ENCOUNTER — Telehealth: Payer: Self-pay | Admitting: *Deleted

## 2022-10-17 NOTE — Telephone Encounter (Signed)
   Pre-operative Risk Assessment    Patient Name: [name] [name]  DOB: 1991/05/31 MRN: [id number]      Request for Surgical Clearance    Procedure:   OPEN HERNIA REPAIR  Date of Surgery:  Clearance TBD                                 Surgeon:  Gaynelle Adu, MD Surgeon's Group or Practice Name:  [facility] Phone number:  [phone] Fax number:  [phone]   Type of Clearance Requested:   - Medical  - Pharmacy:  Hold Warfarin (Coumadin) NOT INDICATED HOW LONG   Type of Anesthesia:  General    Additional requests/questions:    Wilhemina Cash   10/17/2022, 11:55 AM

## 2022-10-18 NOTE — Telephone Encounter (Signed)
Left message on machine for pt to contact the office to make in office appt.

## 2022-10-18 NOTE — Telephone Encounter (Signed)
Ok for pt to hold warfarin for 5 days prior to procedure. He does not require perioperative bridging.

## 2022-10-18 NOTE — Telephone Encounter (Signed)
Patient with diagnosis of On-X mechanical AVR 09/27/21 on warfarin for anticoagulation.    Procedure: hernia repair Date of procedure: TBD  CrCl >184mL/min Platelet count 282K  Per office protocol, patient can hold warfarin for 5 days prior to procedure. Previously bridged with heparin/Lovenox for hernia repair last year, however procedure was done within the few months after his valve replacement. He is now >1 year out from valve replacement, and ACC/AHA guidelines do not recommend periprocedural bridging for On-X mechanical AVR in absence of other risk factors. Will forward to MD to confirm that 5 day warfarin hold is acceptable without bridging since his valve replacement was over a year ago.  **This guidance is not considered finalized until pre-operative APP has relayed final recommendations.**

## 2022-10-18 NOTE — Telephone Encounter (Signed)
   Name: Jonnathon Jagielski Rexford Mancil  DOB: [birth year]  MRN: [id number]  Primary Cardiologist: Orbie Pyo, MD  Chart reviewed as part of pre-operative protocol coverage. Because of Vicente Raymond Rexford Furuta's past medical history and time since last visit, he will require a follow-up in-office visit in order to better assess preoperative cardiovascular risk.  Pre-op covering staff: - Please schedule appointment and call patient to inform them. If patient already had an upcoming appointment within acceptable timeframe, please add "pre-op clearance" to the appointment notes so provider is aware. - Please contact requesting surgeon's office via preferred method (i.e, phone, fax) to inform them of need for appointment prior to surgery.  Ok for pt to hold warfarin for 5 days prior to procedure. He does not require perioperative bridging.  Sharlene Dory, [name]  10/18/2022, 11:33 AM

## 2022-10-23 NOTE — Telephone Encounter (Signed)
Spoke with patient who agreeable to see Jari Favre, [name] on 5/23 at 8:50 am. I will fax over recommendations to requesting provider's office.

## 2022-10-29 ENCOUNTER — Other Ambulatory Visit: Payer: Self-pay | Admitting: Physician Assistant

## 2022-10-31 NOTE — Progress Notes (Unsigned)
Office Visit    Patient Name: [name] [name] Date of Encounter: 11/01/2022  PCP:  [name], NP   [facility]  Cardiologist:  Orbie Pyo, MD  Advanced Practice Provider:  No care team member to display Electrophysiologist:  None   HPI    [name] [name] is a 32 y.o. male with a past medical history of bicuspid valve status post aortic valve replacement and arterioplasty, long-term use of anticoagulants, hernia of the abdominal wall, and dizziness presents today for preop clearance.  The patient was seen last year and underwent elective aortic valve replacement and ascending aortic aneurysm repair surgery April 2023.  Uncomplicated postoperative course and was discharged 5 days following surgery.  He was seen in Oct 16, 2021 by Dr. Lynnette Caffey.  At that time he was doing well.  Had a little lightheadedness at times but no frank syncope.  Incisions were healing well although he was having some paresthesias and occasional soreness.  Breathing was much better.  No severe bleeding or bruising.  Otherwise, feeling well.  Today, he tells me that he has been exercising more and swimming regularly.  He used to swim in college and he can swim about a mile straight.  He has not needed to use his inhaler as often.  He has been feeling well from a heart standpoint.  Earlier in the year he had some muscle spasms and achy muscle feelings.  Pain was positional in nature.  He has not been doing any lifting but been doing some range of motion exercises and would feel perhaps some scar tissue from his incision.  Nonexertional pain.  His blood pressure is a little elevated today however he had not taken his metoprolol since Friday of last week.  We have provided refills today.  We have asked him to purchase a blood pressure cuff to keep track.  He gets his INR drawn about every 6 weeks.  Last INR was 1.9.  Per pharmacy and Dr. Lynnette Caffey can hold  Coumadin x 5 days prior to procedure and restart medically safe to do so.  Should not need bridge since he is over a year out from surgery.    Past Medical History    Past Medical History:  Diagnosis Date   Asthma    Headache    migraines   Heart murmur    Hypokalemia 06/08/2020   Leukocytosis 01/23/2022   Mild aortic stenosis    Pancreatitis    Pneumonia    as a child x several   Substance abuse [facility])    Past Surgical History:  Procedure Laterality Date   AORTIC VALVE REPLACEMENT N/A 09/27/2021   Procedure: AORTIC VALVE REPLACEMENT (AVR) USING ON-X AORTIC VALVE SIZE ;  Surgeon: Loreli Slot, MD;  Location[facility] OR;  Service: Open Heart Surgery;  Laterality: N/A;   INGUINAL HERNIA REPAIR Right 01/05/2022   Procedure: OPEN REPAIR INCARCERATED RIGHT INGUINAL HERNIA WITH MESH;  Surgeon: Gaynelle Adu, MD;  Location: [facility] ORS;  Service: General;  Laterality: Right;  90 MIN - ROOM 2   REPLACEMENT ASCENDING AORTA N/A 09/27/2021   Procedure: REPLACEMENT ASCENDING AND INNOMINATE ANEURYSM USING HEMASHIELD PLATINUM [id number];  Surgeon: Loreli Slot, MD;  Location: [name] OR;  Service: Open Heart Surgery;  Laterality: N/A;   RIGHT HEART CATH AND CORONARY ANGIOGRAPHY N/A 09/07/2021   Procedure: RIGHT HEART CATH AND CORONARY ANGIOGRAPHY;  Surgeon: Orbie Pyo, MD;  Location: [facility] INVASIVE CV LAB;  Service: Cardiovascular;  Laterality: N/A;   TEE WITHOUT CARDIOVERSION N/A 09/27/2021   Procedure: TRANSESOPHAGEAL ECHOCARDIOGRAM (TEE);  Surgeon: Loreli Slot, MD;  Location: [facility] OR;  Service: Open Heart Surgery;  Laterality: N/A;    Allergies  Allergies  Allergen Reactions   Amoxicillin Hives     EKGs/Labs/Other Studies Reviewed:   The following studies were reviewed today: Cardiac Studies & Procedures   CARDIAC CATHETERIZATION  CARDIAC CATHETERIZATION 09/07/2021  Narrative 1.  Normal right dominant circulation. 2.  Normal cardiac output and index with with  normal filling pressures with a mean PA pressure of 15 mmHg, wedge pressure of 11 mmHg, and mean right atrial pressure of 4 mmHg.  Recommendation: Cardiothoracic surgery evaluation.  Findings Coronary Findings Diagnostic  Dominance: Right  No diagnostic findings have been documented. Intervention  No interventions have been documented.     ECHOCARDIOGRAM  ECHOCARDIOGRAM COMPLETE 11/07/2021  Narrative ECHOCARDIOGRAM REPORT    Patient Name:   [name] [name] Date of Exam: 11/07/2021 Medical Rec #:  [id number]                      Height:       71.0 in Accession #:    [id number]                     Weight:       189.0 lb Date of Birth:  July 06, 1990                       BSA:          2.058 m Patient Age:    30 years                       BP:           127/77 mmHg Patient Gender: M                              HR:           70 bpm. Exam Location:  [address]  Procedure: 2D Echo, Cardiac Doppler and Color Doppler  Indications:    I35.0 AS  History:        Patient has prior history of Echocardiogram examinations, most recent 08/28/2021. Dilated aortic root; S/p AVR (mechanical 09/27/21). Aortic Valve: 24 mm On-X mechanical valve is present in the aortic position. Procedure Date: 09/28/2021.  Sonographer:    Samule Ohm RDCS Referring Phys: [id number] Orbie Pyo  IMPRESSIONS   1. Mild septal dyskinesis consistent with postoperative state. Left ventricular ejection fraction, by estimation, is 60 to 65%. Left ventricular ejection fraction by PLAX is 63 %. The left ventricle has normal function. The left ventricle has no regional wall motion abnormalities. There is moderate left ventricular hypertrophy. Left ventricular diastolic parameters were normal. 2. Right ventricular systolic function is normal. The right ventricular size is normal. There is normal pulmonary artery systolic pressure. 3. The mitral valve is normal in structure. Trivial mitral valve  regurgitation. No evidence of mitral stenosis. 4. The aortic valve has been repaired/replaced. Aortic valve regurgitation is not visualized. No aortic stenosis is present. There is a 24 mm On-X mechanical valve present in the aortic position. Procedure Date: 09/28/2021. Echo findings are consistent with normal structure and function of the aortic valve prosthesis. Aortic valve area, by VTI measures 1.43 cm. Aortic valve  mean gradient measures 11.8 mmHg. Aortic valve Vmax measures 2.42 m/s. 5. 28 x 10 Hemishield graft repair of the ascending aorta and innominate artery. Aortic root/ascending aorta has been repaired/replaced. There is mild dilatation of the aortic root, measuring 41 mm. 6. The inferior vena cava is normal in size with greater than 50% respiratory variability, suggesting right atrial pressure of 3 mmHg.  FINDINGS Left Ventricle: Mild septal dyskinesis consistent with postoperative state. Left ventricular ejection fraction, by estimation, is 60 to 65%. Left ventricular ejection fraction by PLAX is 63 %. The left ventricle has normal function. The left ventricle has no regional wall motion abnormalities. The left ventricular internal cavity size was normal in size. There is moderate left ventricular hypertrophy. Left ventricular diastolic parameters were normal.  Right Ventricle: The right ventricular size is normal. No increase in right ventricular wall thickness. Right ventricular systolic function is normal. There is normal pulmonary artery systolic pressure. The tricuspid regurgitant velocity is 2.09 m/s, and with an assumed right atrial pressure of 3 mmHg, the estimated right ventricular systolic pressure is 20.5 mmHg.  Left Atrium: Left atrial size was normal in size.  Right Atrium: Right atrial size was normal in size.  Pericardium: There is no evidence of pericardial effusion.  Mitral Valve: The mitral valve is normal in structure. Trivial mitral valve regurgitation. No  evidence of mitral valve stenosis.  Tricuspid Valve: The tricuspid valve is normal in structure. Tricuspid valve regurgitation is trivial. No evidence of tricuspid stenosis.  Aortic Valve: The aortic valve has been repaired/replaced. Aortic valve regurgitation is not visualized. No aortic stenosis is present. Aortic valve mean gradient measures 11.8 mmHg. Aortic valve peak gradient measures 23.3 mmHg. Aortic valve area, by VTI measures 1.43 cm. There is a 24 mm On-X mechanical valve present in the aortic position. Procedure Date: 09/28/2021. Echo findings are consistent with normal structure and function of the aortic valve prosthesis.  Pulmonic Valve: The pulmonic valve was normal in structure. Pulmonic valve regurgitation is trivial. No evidence of pulmonic stenosis.  Aorta: 28 x 10 Hemishield graft repair of the ascending aorta and innominate artery. The aortic root/ascending aorta has been repaired/replaced. There is mild dilatation of the aortic root, measuring 41 mm.  Venous: The inferior vena cava is normal in size with greater than 50% respiratory variability, suggesting right atrial pressure of 3 mmHg.  IAS/Shunts: No atrial level shunt detected by color flow Doppler.   LEFT VENTRICLE PLAX 2D LV EF:         Left            Diastology ventricular     LV e' medial:    10.40 cm/s ejection        LV E/e' medial:  7.9 fraction by     LV e' lateral:   14.40 cm/s PLAX is 63      LV E/e' lateral: 5.7 %. LVIDd:         4.70 cm LVIDs:         3.10 cm LV PW:         1.20 cm LV IVS:        1.50 cm LVOT diam:     2.10 cm LV SV:         63 LV SV Index:   30 LVOT Area:     3.46 cm   RIGHT VENTRICLE             IVC RV S prime:     14.30 cm/s  IVC diam: 1.20 cm TAPSE (M-mode): 1.8 cm RVSP:           20.5 mmHg  LEFT ATRIUM             Index        RIGHT ATRIUM           Index LA diam:        3.40 cm 1.65 cm/m   RA Pressure: 3.00 mmHg LA Vol (A2C):   36.8 ml 17.88 ml/m  RA Area:      14.90 cm LA Vol (A4C):   45.2 ml 21.96 ml/m  RA Volume:   35.90 ml  17.44 ml/m LA Biplane Vol: 44.2 ml 21.47 ml/m AORTIC VALVE AV Area (Vmax):    1.36 cm AV Area (Vmean):   1.41 cm AV Area (VTI):     1.43 cm AV Vmax:           241.50 cm/s AV Vmean:          155.000 cm/s AV VTI:            0.439 m AV Peak Grad:      23.3 mmHg AV Mean Grad:      11.8 mmHg LVOT Vmax:         95.00 cm/s LVOT Vmean:        63.000 cm/s LVOT VTI:          0.181 m LVOT/AV VTI ratio: 0.41  AORTA Ao Root diam: 4.10 cm Ao Asc diam:  3.10 cm  MITRAL VALVE               TRICUSPID VALVE MV Area (PHT): 2.48 cm    TR Peak grad:   17.5 mmHg MV Decel Time: 306 msec    TR Vmax:        209.00 cm/s MV E velocity: 81.80 cm/s  Estimated RAP:  3.00 mmHg MV A velocity: 70.70 cm/s  RVSP:           20.5 mmHg MV E/A ratio:  1.16 SHUNTS Systemic VTI:  0.18 m Systemic Diam: 2.10 cm  Chilton Si MD Electronically signed by Chilton Si MD Signature Date/Time: 11/07/2021/1:22:37 PM    Final   TEE  ECHO INTRAOPERATIVE TEE 09/27/2021  Narrative *INTRAOPERATIVE TRANSESOPHAGEAL REPORT *    Patient Name:   [name] [name] Date of Exam: 09/27/2021 Medical Rec #:  [id number]                      Height:       71.0 in Accession #:    [id number]                     Weight:       195.0 lb Date of Birth:  [birth year]                       BSA:          2.09 m Patient Age:    30 years                       BP:           130/76 mmHg Patient Gender: M                              HR:  57 bpm. Exam Location:  Anesthesiology  Transesophogeal exam was perform intraoperatively during surgical procedure. Patient was closely monitored under general anesthesia during the entirety of examination.  Indications:     Aortic Valve Disease Sonographer:     Eulah Pont RDCS Performing Phys: Leslye Peer MD Diagnosing Phys: Leslye Peer MD  Complications: No known complications during this  procedure. POST-OP IMPRESSIONS _ Left Ventricle: The left ventricle is unchanged from pre-bypass. _ Right Ventricle: The right ventricle appears unchanged from pre-bypass. _ Aorta: A graft was placed in the ascending aorta for repair. _ Left Atrium: The left atrium appears unchanged from pre-bypass. _ Left Atrial Appendage: The left atrial appendage appears unchanged from pre-bypass. _ Aortic Valve: A bileaflet mechanical valve was placed Manufactured by; On-X Size; 23mm. No regurgitation post repair. No perivalvular leak noted. _ Mitral Valve: The mitral valve appears unchanged from pre-bypass. _ Tricuspid Valve: The tricuspid valve appears unchanged from pre-bypass. _ Pulmonic Valve: The pulmonic valve appears unchanged from pre-bypass. _ Interatrial Septum: The interatrial septum appears unchanged from pre-bypass. _ Interventricular Septum: The interventricular septum appears unchanged from pre-bypass. _ Pericardium: The pericardium appears unchanged from pre-bypass.  PRE-OP FINDINGS Left Ventricle: The left ventricle has normal systolic function, with an ejection fraction of 60-65%. The cavity size was normal. There is no left ventricular hypertrophy.   Right Ventricle: The right ventricle has normal systolic function. The cavity was normal. There is no increase in right ventricular wall thickness.  Left Atrium: Left atrial size was normal in size. No left atrial/left atrial appendage thrombus was detected.  Right Atrium: Right atrial size was normal in size.  Interatrial Septum: No atrial level shunt detected by color flow Doppler.  Pericardium: There is no evidence of pericardial effusion.  Mitral Valve: The mitral valve is normal in structure. Mitral valve regurgitation is trivial by color flow Doppler. There is No evidence of mitral stenosis.  Tricuspid Valve: The tricuspid valve was normal in structure. Tricuspid valve regurgitation was not visualized by color flow Doppler.  No evidence of tricuspid stenosis is present.  Aortic Valve: The aortic valve is bicuspid Aortic valve regurgitation is moderate by color flow Doppler. The jet is eccentric. There is moderate stenosis of the aortic valve.   Pulmonic Valve: The pulmonic valve was normal in structure. Pulmonic valve regurgitation is not visualized by color flow Doppler.   Aorta: The is normal in size and structure. There is moderate dilatation of the ascending aorta.  +-------------+---------++ AORTIC VALVE           +-------------+---------++ AV Mean Grad:14.0 mmHg +-------------+---------++   Leslye Peer MD Electronically signed by Leslye Peer MD Signature Date/Time: 09/27/2021/3:12:13 PM    Final             EKG:  EKG is  ordered today.  The ekg ordered today demonstrates normal sinus rhythm, rate 67 bpm  Recent Labs: 08/03/2022: ALT 17; BUN 12; Creatinine, Ser 0.83; Hemoglobin 13.7; Platelets 282.0; Potassium 4.3; Sodium 141; TSH 1.42  Recent Lipid Panel    Component Value Date/Time   CHOL 163 08/03/2022 0826   TRIG 77.0 08/03/2022 0826   HDL 50.00 08/03/2022 0826   CHOLHDL 3 08/03/2022 0826   VLDL 15.4 08/03/2022 0826   LDLCALC 98 08/03/2022 0826    Home Medications   Current Meds  Medication Sig   acetaminophen (TYLENOL) 500 MG tablet Take 1 tablet (500 mg total) by mouth every 6 (six) hours as needed (pain).   albuterol (VENTOLIN HFA) 108 (  90 Base) MCG/ACT inhaler Inhale 2 puffs into the lungs as needed for wheezing or shortness of breath.   aspirin EC 81 MG EC tablet Take 1 tablet (81 mg total) by mouth daily. Swallow whole.   loratadine (CLARITIN) 10 MG tablet Take 10 mg by mouth daily.   metoprolol succinate (TOPROL XL) 25 MG 24 hr tablet Take 0.5 tablets (12.5 mg total) by mouth at bedtime. (Patient taking differently: Take 12.5 mg by mouth daily.)   warfarin (COUMADIN) 3 MG tablet TAKE 1 AND 1/2 TABLETS BY MOUTH DAILY. EXCEPT TAKE 2 TABLETS ON MONDAYS, WEDNESDAYS  AND FRIDAYS OR AS DIRECTED BY COAGULATION CLINIC     Review of Systems      All other systems reviewed and are otherwise negative except as noted above.  Physical Exam    VS:  BP (!) 154/98   Pulse 67   Ht 5\' 11"  (1.803 m)   Wt 191 lb 10.1 oz (86.9 kg)   SpO2 100%   BMI 26.73 kg/m  , BMI Body mass index is 26.73 kg/m.  Wt Readings from Last 3 Encounters:  11/01/22 191 lb 10.1 oz (86.9 kg)  08/03/22 193 lb 6.4 oz (87.7 kg)  01/23/22 196 lb 3.4 oz (89 kg)     GEN: Well nourished, well developed, in no acute distress. HEENT: normal. Neck: Supple, no JVD, carotid bruits, or masses. Cardiac: RRR, no murmurs, mechanical click, rubs, or gallops. No clubbing, cyanosis, edema.  Radials/PT 2+ and equal bilaterally.  Respiratory:  Respirations regular and unlabored, clear to auscultation bilaterally. GI: Soft, nontender, nondistended. MS: No deformity or atrophy. Skin: Warm and dry, no rash. Neuro:  Strength and sensation are intact. Psych: Normal affect.  Assessment & Plan    Preop clearance  Mr. Neider's perioperative risk of a major cardiac event is 0.9% according to the Revised Cardiac Risk Index (RCRI).  Therefore, he is at low risk for perioperative complications.   His functional capacity is good at 6.55 METs according to the [place] Activity Status Index (DASI). Recommendations: According to ACC/AHA guidelines, no further cardiovascular testing needed.  The patient may proceed to surgery at acceptable risk.   Antiplatelet and/or Anticoagulation Recommendations:  Coumadin can by held for 5 days prior to surgery.  Please resume post op when felt to be safe.  Bicuspid aortic valve status post AVR -Continue current medications which include aspirin 81 mg daily, metoprolol succinate 12.5 mg daily, Coumadin per the Coumadin clinic -able to swim a mile now, has not done any heavy lifting due to hernia -update echo  Long-term use of anticoagulation with a goal INR of 1.5-2 with  aspirin 81 mg daily indefinitely -no bleeding issues -INR in range 1.5-2.0 goal  Dizziness -first two months, resolved  High BP -isolated incident and he is off his metoprolol -recommend getting a BP cuff and monitor at home daily -please let me know if its remaining elevated and we can titrate medications    Disposition: Follow up 4 months with Orbie Pyo, MD or APP.  Signed, Sharlene Dory, [name] 11/01/2022, 9:31 AM [name]

## 2022-11-01 ENCOUNTER — Encounter: Payer: Self-pay | Admitting: Physician Assistant

## 2022-11-01 ENCOUNTER — Ambulatory Visit: Payer: Commercial Managed Care - PPO | Attending: Physician Assistant | Admitting: Physician Assistant

## 2022-11-01 VITALS — BP 154/98 | HR 67 | Ht 71.0 in | Wt 191.6 lb

## 2022-11-01 DIAGNOSIS — Q231 Congenital insufficiency of aortic valve: Secondary | ICD-10-CM | POA: Diagnosis not present

## 2022-11-01 DIAGNOSIS — R42 Dizziness and giddiness: Secondary | ICD-10-CM | POA: Diagnosis not present

## 2022-11-01 DIAGNOSIS — I7121 Aneurysm of the ascending aorta, without rupture: Secondary | ICD-10-CM

## 2022-11-01 DIAGNOSIS — Z9889 Other specified postprocedural states: Secondary | ICD-10-CM

## 2022-11-01 DIAGNOSIS — Z952 Presence of prosthetic heart valve: Secondary | ICD-10-CM

## 2022-11-01 DIAGNOSIS — Z8679 Personal history of other diseases of the circulatory system: Secondary | ICD-10-CM

## 2022-11-01 DIAGNOSIS — K439 Ventral hernia without obstruction or gangrene: Secondary | ICD-10-CM

## 2022-11-01 DIAGNOSIS — Q2381 Bicuspid aortic valve: Secondary | ICD-10-CM

## 2022-11-01 DIAGNOSIS — I350 Nonrheumatic aortic (valve) stenosis: Secondary | ICD-10-CM

## 2022-11-01 DIAGNOSIS — Z01818 Encounter for other preprocedural examination: Secondary | ICD-10-CM

## 2022-11-01 MED ORDER — METOPROLOL SUCCINATE ER 25 MG PO TB24: 12.5000 mg | ORAL_TABLET | Freq: Every day | ORAL | 3 refills | Status: DC

## 2022-11-01 NOTE — Patient Instructions (Signed)
Medication Instructions:  Your physician recommends that you continue on your current medications as directed. Please refer to the Current Medication list given to you today.  *If you need a refill on your cardiac medications before your next appointment, please call your pharmacy*  Lab Work: None ordered If you have labs (blood work) drawn today and your tests are completely normal, you will receive your results only by: MyChart Message (if you have MyChart) OR A paper copy in the mail If you have any lab test that is abnormal or we need to change your treatment, we will call you to review the results.  Testing/Procedures: Your physician has requested that you have an echocardiogram. Echocardiography is a painless test that uses sound waves to create images of your heart. It provides your doctor with information about the size and shape of your heart and how well your heart's chambers and valves are working. This procedure takes approximately one hour. There are no restrictions for this procedure. Please do NOT wear cologne, perfume, aftershave, or lotions (deodorant is allowed). Please arrive 15 minutes prior to your appointment time.   Follow-Up: At [facility], you and your health needs are our priority.  As part of our continuing mission to provide you with exceptional heart care, we have created designated Provider Care Teams.  These Care Teams include your primary Cardiologist (physician) and Advanced Practice Providers (APPs -  Physician Assistants and Nurse Practitioners) who all work together to provide you with the care you need, when you need it.  Your next appointment:   3-4 month(s) or first available thereafter   Provider:   Orbie Pyo, MD    Other Instructions 1.Omron is the brand of home blood pressure monitor that we recommend 2.Check your blood pressure daily, 1 hr after morning medications for 2 weeks, keep a log and send [place] the readings through mychart  at the end of the 2 weeks.    Heart-Healthy Eating Plan Many factors influence your heart health, including eating and exercise habits. Heart health is also called coronary health. Coronary risk increases with abnormal blood fat (lipid) levels. A heart-healthy eating plan includes limiting unhealthy fats, increasing healthy fats, limiting salt (sodium) intake, and making other diet and lifestyle changes. What is my plan? Your health care provider may recommend that: You limit your fat intake to _________% or less of your total calories each day. You limit your saturated fat intake to _________% or less of your total calories each day. You limit the amount of cholesterol in your diet to less than _________ mg per day. You limit the amount of sodium in your diet to less than _________ mg per day. What are tips for following this plan? Cooking Cook foods using methods other than frying. Baking, boiling, grilling, and broiling are all good options. Other ways to reduce fat include: Removing the skin from poultry. Removing all visible fats from meats. Steaming vegetables in water or broth. Meal planning  At meals, imagine dividing your plate into fourths: Fill one-half of your plate with vegetables and green salads. Fill one-fourth of your plate with whole grains. Fill one-fourth of your plate with lean protein foods. Eat 2-4 cups of vegetables per day. One cup of vegetables equals 1 cup (91 g) broccoli or cauliflower florets, 2 medium carrots, 1 large bell pepper, 1 large sweet potato, 1 large tomato, 1 medium white potato, 2 cups (150 g) raw leafy greens. Eat 1-2 cups of fruit per day. One  cup of fruit equals 1 small apple, 1 large banana, 1 cup (237 g) mixed fruit, 1 large orange,  cup (82 g) dried fruit, 1 cup (240 mL) 100% fruit juice. Eat more foods that contain soluble fiber. Examples include apples, broccoli, carrots, beans, peas, and barley. Aim to get 25-30 g of fiber per  day. Increase your consumption of legumes, nuts, and seeds to 4-5 servings per week. One serving of dried beans or legumes equals  cup (90 g) cooked, 1 serving of nuts is  oz (12 almonds, 24 pistachios, or 7 walnut halves), and 1 serving of seeds equals  oz (8 g). Fats Choose healthy fats more often. Choose monounsaturated and polyunsaturated fats, such as olive and canola oils, avocado oil, flaxseeds, walnuts, almonds, and seeds. Eat more omega-3 fats. Choose salmon, mackerel, sardines, tuna, flaxseed oil, and ground flaxseeds. Aim to eat fish at least 2 times each week. Check food labels carefully to identify foods with trans fats or high amounts of saturated fat. Limit saturated fats. These are found in animal products, such as meats, butter, and cream. Plant sources of saturated fats include palm oil, palm kernel oil, and coconut oil. Avoid foods with partially hydrogenated oils in them. These contain trans fats. Examples are stick margarine, some tub margarines, cookies, crackers, and other baked goods. Avoid fried foods. General information Eat more home-cooked food and less restaurant, buffet, and fast food. Limit or avoid alcohol. Limit foods that are high in added sugar and simple starches such as foods made using white refined flour (white breads, pastries, sweets). Lose weight if you are overweight. Losing just 5-10% of your body weight can help your overall health and prevent diseases such as diabetes and heart disease. Monitor your sodium intake, especially if you have high blood pressure. Talk with your health care provider about your sodium intake. Try to incorporate more vegetarian meals weekly. What foods should I eat? Fruits All fresh, canned (in natural juice), or frozen fruits. Vegetables Fresh or frozen vegetables (raw, steamed, roasted, or grilled). Green salads. Grains Most grains. Choose whole wheat and whole grains most of the time. Rice and pasta, including brown  rice and pastas made with whole wheat. Meats and other proteins Lean, well-trimmed beef, veal, pork, and lamb. Chicken and [place] without skin. All fish and shellfish. Wild duck, rabbit, pheasant, and venison. Egg whites or low-cholesterol egg substitutes. Dried beans, peas, lentils, and tofu. Seeds and most nuts. Dairy Low-fat or nonfat cheeses, including ricotta and mozzarella. Skim or 1% milk (liquid, powdered, or evaporated). Buttermilk made with low-fat milk. Nonfat or low-fat yogurt. Fats and oils Non-hydrogenated (trans-free) margarines. Vegetable oils, including soybean, sesame, sunflower, olive, avocado, peanut, safflower, corn, canola, and cottonseed. Salad dressings or mayonnaise made with a vegetable oil. Beverages Water (mineral or sparkling). Coffee and tea. Unsweetened ice tea. Diet beverages. Sweets and desserts Sherbet, gelatin, and fruit ice. Small amounts of dark chocolate. Limit all sweets and desserts. Seasonings and condiments All seasonings and condiments. The items listed above may not be a complete list of foods and beverages you can eat. Contact a dietitian for more options. What foods should I avoid? Fruits Canned fruit in heavy syrup. Fruit in cream or butter sauce. Fried fruit. Limit coconut. Vegetables Vegetables cooked in cheese, cream, or butter sauce. Fried vegetables. Grains Breads made with saturated or trans fats, oils, or whole milk. Croissants. Sweet rolls. Donuts. High-fat crackers, such as cheese crackers and chips. Meats and other proteins Fatty meats, such as  hot dogs, ribs, sausage, bacon, rib-eye roast or steak. High-fat deli meats, such as salami and bologna. Caviar. Domestic duck and goose. Organ meats, such as liver. Dairy Cream, sour cream, cream cheese, and creamed cottage cheese. Whole-milk cheeses. Whole or 2% milk (liquid, evaporated, or condensed). Whole buttermilk. Cream sauce or high-fat cheese sauce. Whole-milk yogurt. Fats and  oils Meat fat, or shortening. Cocoa butter, hydrogenated oils, palm oil, coconut oil, palm kernel oil. Solid fats and shortenings, including bacon fat, salt pork, lard, and butter. Nondairy cream substitutes. Salad dressings with cheese or sour cream. Beverages Regular sodas and any drinks with added sugar. Sweets and desserts Frosting. Pudding. Cookies. Cakes. Pies. Milk chocolate or white chocolate. Buttered syrups. Full-fat ice cream or ice cream drinks. The items listed above may not be a complete list of foods and beverages to avoid. Contact a dietitian for more information. Summary Heart-healthy meal planning includes limiting unhealthy fats, increasing healthy fats, limiting salt (sodium) intake and making other diet and lifestyle changes. Lose weight if you are overweight. Losing just 5-10% of your body weight can help your overall health and prevent diseases such as diabetes and heart disease. Focus on eating a balance of foods, including fruits and vegetables, low-fat or nonfat dairy, lean protein, nuts and legumes, whole grains, and heart-healthy oils and fats. This information is not intended to replace advice given to you by your health care provider. Make sure you discuss any questions you have with your health care provider. Document Revised: 07/03/2021 Document Reviewed: 07/03/2021 Elsevier Patient Education  2023 Elsevier Inc.  Low-Sodium Eating Plan Sodium, which is an element that makes up salt, helps you maintain a healthy balance of fluids in your body. Too much sodium can increase your blood pressure and cause fluid and waste to be held in your body. Your health care provider or dietitian may recommend following this plan if you have high blood pressure (hypertension), kidney disease, liver disease, or heart failure. Eating less sodium can help lower your blood pressure, reduce swelling, and protect your heart, liver, and kidneys. What are tips for following this  plan? Reading food labels The Nutrition Facts label lists the amount of sodium in one serving of the food. If you eat more than one serving, you must multiply the listed amount of sodium by the number of servings. Choose foods with less than 140 mg of sodium per serving. Avoid foods with 300 mg of sodium or more per serving. Shopping  Look for lower-sodium products, often labeled as "low-sodium" or "no salt added." Always check the sodium content, even if foods are labeled as "unsalted" or "no salt added." Buy fresh foods. Avoid canned foods and pre-made or frozen meals. Avoid canned, cured, or processed meats. Buy breads that have less than 80 mg of sodium per slice. Cooking  Eat more home-cooked food and less restaurant, buffet, and fast food. Avoid adding salt when cooking. Use salt-free seasonings or herbs instead of table salt or sea salt. Check with your health care provider or pharmacist before using salt substitutes. Cook with plant-based oils, such as canola, sunflower, or olive oil. Meal planning When eating at a restaurant, ask that your food be prepared with less salt or no salt, if possible. Avoid dishes labeled as brined, pickled, cured, smoked, or made with soy sauce, miso, or teriyaki sauce. Avoid foods that contain MSG (monosodium glutamate). MSG is sometimes added to [place] food, bouillon, and some canned foods. Make meals that can be grilled, baked, poached,  roasted, or steamed. These are generally made with less sodium. General information Most people on this plan should limit their sodium intake to 1,500-2,000 mg (milligrams) of sodium each day. What foods should I eat? Fruits Fresh, frozen, or canned fruit. Fruit juice. Vegetables Fresh or frozen vegetables. "No salt added" canned vegetables. "No salt added" tomato sauce and paste. Low-sodium or reduced-sodium tomato and vegetable juice. Grains Low-sodium cereals, including oats, puffed wheat and rice, and  shredded wheat. Low-sodium crackers. Unsalted rice. Unsalted pasta. Low-sodium bread. Whole-grain breads and whole-grain pasta. Meats and other proteins Fresh or frozen (no salt added) meat, poultry, seafood, and fish. Low-sodium canned tuna and salmon. Unsalted nuts. Dried peas, beans, and lentils without added salt. Unsalted canned beans. Eggs. Unsalted nut butters. Dairy Milk. Soy milk. Cheese that is naturally low in sodium, such as ricotta cheese, fresh mozzarella, or Swiss cheese. Low-sodium or reduced-sodium cheese. Cream cheese. Yogurt. Seasonings and condiments Fresh and dried herbs and spices. Salt-free seasonings. Low-sodium mustard and ketchup. Sodium-free salad dressing. Sodium-free light mayonnaise. Fresh or refrigerated horseradish. Lemon juice. Vinegar. Other foods Homemade, reduced-sodium, or low-sodium soups. Unsalted popcorn and pretzels. Low-salt or salt-free chips. The items listed above may not be a complete list of foods and beverages you can eat. Contact a dietitian for more information. What foods should I avoid? Vegetables Sauerkraut, pickled vegetables, and relishes. Olives. [place] fries. Onion rings. Regular canned vegetables (not low-sodium or reduced-sodium). Regular canned tomato sauce and paste (not low-sodium or reduced-sodium). Regular tomato and vegetable juice (not low-sodium or reduced-sodium). Frozen vegetables in sauces. Grains Instant hot cereals. Bread stuffing, pancake, and biscuit mixes. Croutons. Seasoned rice or pasta mixes. Noodle soup cups. Boxed or frozen macaroni and cheese. Regular salted crackers. Self-rising flour. Meats and other proteins Meat or fish that is salted, canned, smoked, spiced, or pickled. Precooked or cured meat, such as sausages or meat loaves. Tomasa Blase. Ham. Pepperoni. Hot dogs. Corned beef. Chipped beef. Salt pork. Jerky. Pickled herring. Anchovies and sardines. Regular canned tuna. Salted nuts. Dairy Processed cheese and cheese  spreads. Hard cheeses. Cheese curds. Blue cheese. Feta cheese. String cheese. Regular cottage cheese. Buttermilk. Canned milk. Fats and oils Salted butter. Regular margarine. Ghee. Bacon fat. Seasonings and condiments Onion salt, garlic salt, seasoned salt, table salt, and sea salt. Canned and packaged gravies. Worcestershire sauce. Tartar sauce. Barbecue sauce. Teriyaki sauce. Soy sauce, including reduced-sodium. Steak sauce. Fish sauce. Oyster sauce. Cocktail sauce. Horseradish that you find on the shelf. Regular ketchup and mustard. Meat flavorings and tenderizers. Bouillon cubes. Hot sauce. Pre-made or packaged marinades. Pre-made or packaged taco seasonings. Relishes. Regular salad dressings. Salsa. Other foods Salted popcorn and pretzels. Corn chips and puffs. Potato and tortilla chips. Canned or dried soups. Pizza. Frozen entrees and pot pies. The items listed above may not be a complete list of foods and beverages you should avoid. Contact a dietitian for more information. Summary Eating less sodium can help lower your blood pressure, reduce swelling, and protect your heart, liver, and kidneys. Most people on this plan should limit their sodium intake to 1,500-2,000 mg (milligrams) of sodium each day. Canned, boxed, and frozen foods are high in sodium. Restaurant foods, fast foods, and pizza are also very high in sodium. You also get sodium by adding salt to food. Try to cook at home, eat more fresh fruits and vegetables, and eat less fast food and canned, processed, or prepared foods. This information is not intended to replace advice given to you by your health  care provider. Make sure you discuss any questions you have with your health care provider. Document Revised: 05/04/2019 Document Reviewed: 04/29/2019 Elsevier Patient Education  2023 [employer].

## 2022-11-09 NOTE — Telephone Encounter (Signed)
Pt called in stating requesting has not received clearance yet. Please advise.

## 2022-11-09 NOTE — Telephone Encounter (Signed)
Left detailed message for the pt that Jari Favre, NP did fax her ov notes on 11/01/22 providing clearance and recommendations for coumadin hold. I left message that I will re-fax these notes though wanted to assure the pt that we did fax notes providing clearance on 11/01/22.

## 2022-11-12 ENCOUNTER — Ambulatory Visit: Payer: Commercial Managed Care - PPO | Attending: Cardiology | Admitting: *Deleted

## 2022-11-12 DIAGNOSIS — Z5181 Encounter for therapeutic drug level monitoring: Secondary | ICD-10-CM | POA: Diagnosis not present

## 2022-11-12 DIAGNOSIS — Z952 Presence of prosthetic heart valve: Secondary | ICD-10-CM

## 2022-11-12 LAB — POCT INR: POC INR: 1.8

## 2022-11-12 NOTE — Patient Instructions (Signed)
Description   Continue taking warfarin 4.5mg daily except for 6mg on Mondays.  Continue eating 5 serving of greens weekly. [facility] [phone]; INR in 6 weeks     

## 2022-11-15 ENCOUNTER — Telehealth: Payer: Self-pay | Admitting: Internal Medicine

## 2022-11-15 NOTE — Telephone Encounter (Signed)
New Message:    Patient called and wanted you to know that his surgery is scheduled for 12-17-26.

## 2022-11-20 ENCOUNTER — Other Ambulatory Visit: Payer: Self-pay | Admitting: Internal Medicine

## 2022-11-20 DIAGNOSIS — Z952 Presence of prosthetic heart valve: Secondary | ICD-10-CM

## 2022-11-21 NOTE — Progress Notes (Signed)
Sent message, via epic in basket, requesting orders in epic from surgeon.  

## 2022-11-21 NOTE — Telephone Encounter (Signed)
Warfarin 3mg  refill S/P AVR (aortic valve replacement)  Last INR 11/12/22 Last OV 11/01/22

## 2022-11-26 ENCOUNTER — Ambulatory Visit: Payer: Self-pay | Admitting: General Surgery

## 2022-11-26 DIAGNOSIS — Z7901 Long term (current) use of anticoagulants: Secondary | ICD-10-CM

## 2022-11-26 NOTE — Progress Notes (Signed)
Second request for pre op orders in [facility]: Spoke with Joni Reining

## 2022-11-27 NOTE — Patient Instructions (Signed)
DUE TO COVID-19 ONLY TWO VISITORS  (aged 32 and older)  ARE ALLOWED TO COME WITH YOU AND STAY IN THE WAITING ROOM ONLY DURING PRE OP AND PROCEDURE.   **NO VISITORS ARE ALLOWED IN THE SHORT STAY AREA OR RECOVERY ROOM!!**  IF YOU WILL BE ADMITTED INTO THE HOSPITAL YOU ARE ALLOWED ONLY FOUR SUPPORT PEOPLE DURING VISITATION HOURS ONLY (7 AM -8PM)   The support person(s) must pass our screening, gel in and out, and wear a mask at all times, including in the patient's room. Patients must also wear a mask when staff or their support person are in the room. Visitors GUEST BADGE MUST BE WORN VISIBLY  One adult visitor may remain with you overnight and MUST be in the room by 8 P.M.     Your procedure is scheduled on: 12/17/22   Report to [facility] Main Entrance    Report to admitting at : 12:45 PM   Call this number if you have problems the morning of surgery [phone]   Do not eat food :After Midnight.   After Midnight you may have the following liquids until : 12:00 PM DAY OF SURGERY  Water Black Coffee (sugar ok, NO MILK/CREAM OR CREAMERS)  Tea (sugar ok, NO MILK/CREAM OR CREAMERS) regular and decaf                             Plain Jell-O (NO RED)                                           Fruit ices (not with fruit pulp, NO RED)                                     Popsicles (NO RED)                                                                  Juice: apple, WHITE grape, WHITE cranberry Sports drinks like Gatorade (NO RED)               Oral Hygiene is also important to reduce your risk of infection.                                    Remember - BRUSH YOUR TEETH THE MORNING OF SURGERY WITH YOUR REGULAR TOOTHPASTE  DENTURES WILL BE REMOVED PRIOR TO SURGERY PLEASE DO NOT APPLY "Poly grip" OR ADHESIVES!!!   Do NOT smoke after Midnight   Take these medicines the morning of surgery with A SIP OF WATER: loratadine.Use inhalers as usual.Tylenol as needed.                               You may not have any metal on your body including hair pins, jewelry, and body piercing             Do not wear lotions, powders, perfumes/cologne,  or deodorant              Men may shave face and neck.   Do not bring valuables to the hospital. [place] IS NOT             RESPONSIBLE   FOR VALUABLES.   Contacts, glasses, or bridgework may not be worn into surgery.   Bring small overnight bag day of surgery.   DO NOT BRING YOUR HOME MEDICATIONS TO THE HOSPITAL. PHARMACY WILL DISPENSE MEDICATIONS LISTED ON YOUR MEDICATION LIST TO YOU DURING YOUR ADMISSION IN THE HOSPITAL!    Patients discharged on the day of surgery will not be allowed to drive home.  Someone NEEDS to stay with you for the first 24 hours after anesthesia.   Special Instructions: Bring a copy of your healthcare power of attorney and living will documents         the day of surgery if you haven't scanned them before.              Please read over the following fact sheets you were given: IF YOU HAVE QUESTIONS ABOUT YOUR PRE-OP INSTRUCTIONS PLEASE CALL [phone]    [facility] - Preparing for Surgery Before surgery, you can play an important role.  Because skin is not sterile, your skin needs to be as free of germs as possible.  You can reduce the number of germs on your skin by washing with CHG (chlorahexidine gluconate) soap before surgery.  CHG is an antiseptic cleaner which kills germs and bonds with the skin to continue killing germs even after washing. Please DO NOT use if you have an allergy to CHG or antibacterial soaps.  If your skin becomes reddened/irritated stop using the CHG and inform your nurse when you arrive at Short Stay. Do not shave (including legs and underarms) for at least 48 hours prior to the first CHG shower.  You may shave your face/neck. Please follow these instructions carefully:  1.  Shower with CHG Soap the night before surgery and the  morning of Surgery.  2.  If you choose to wash  your hair, wash your hair first as usual with your  normal  shampoo.  3.  After you shampoo, rinse your hair and body thoroughly to remove the  shampoo.                           4.  Use CHG as you would any other liquid soap.  You can apply chg directly  to the skin and wash                       Gently with a scrungie or clean washcloth.  5.  Apply the CHG Soap to your body ONLY FROM THE NECK DOWN.   Do not use on face/ open                           Wound or open sores. Avoid contact with eyes, ears mouth and genitals (private parts).                       Wash face,  Genitals (private parts) with your normal soap.             6.  Wash thoroughly, paying special attention to the area where your surgery  will be performed.  7.  Thoroughly rinse your body with warm water from the neck down.  8.  DO NOT shower/wash with your normal soap after using and rinsing off  the CHG Soap.                9.  Pat yourself dry with a clean towel.            10.  Wear clean pajamas.            11.  Place clean sheets on your bed the night of your first shower and do not  sleep with pets. Day of Surgery : Do not apply any lotions/deodorants the morning of surgery.  Please wear clean clothes to the hospital/surgery center.  FAILURE TO FOLLOW THESE INSTRUCTIONS MAY RESULT IN THE CANCELLATION OF YOUR SURGERY PATIENT SIGNATURE_________________________________  NURSE SIGNATURE__________________________________  ________________________________________________________________________

## 2022-11-28 ENCOUNTER — Encounter (HOSPITAL_COMMUNITY): Payer: Self-pay

## 2022-11-28 ENCOUNTER — Other Ambulatory Visit: Payer: Self-pay

## 2022-11-28 ENCOUNTER — Encounter (HOSPITAL_COMMUNITY)
Admission: RE | Admit: 2022-11-28 | Discharge: 2022-11-28 | Disposition: A | Discharge status: Home / Self Care | Payer: Commercial Managed Care - PPO | Source: Ambulatory Visit | Attending: General Surgery | Admitting: General Surgery

## 2022-11-28 VITALS — BP 150/88 | HR 66 | Temp 98.3°F | Ht 71.0 in | Wt 185.0 lb

## 2022-11-28 DIAGNOSIS — Z7901 Long term (current) use of anticoagulants: Secondary | ICD-10-CM

## 2022-11-28 LAB — CBC WITH DIFFERENTIAL/PLATELET
Abs Immature Granulocytes: 0.02 10*3/uL (ref 0.00–0.07)
Basophils Absolute: 0 10*3/uL (ref 0.0–0.1)
Basophils Relative: 1 %
Eosinophils Absolute: 0.3 10*3/uL (ref 0.0–0.5)
Eosinophils Relative: 7 %
HCT: 37.9 % — ABNORMAL LOW (ref 39.0–52.0)
Hemoglobin: 12.7 g/dL — ABNORMAL LOW (ref 13.0–17.0)
Immature Granulocytes: 0 %
Lymphocytes Relative: 34 %
Lymphs Abs: 1.6 10*3/uL (ref 0.7–4.0)
MCH: 31.7 pg (ref 26.0–34.0)
MCHC: 33.5 g/dL (ref 30.0–36.0)
MCV: 94.5 fL (ref 80.0–100.0)
Monocytes Absolute: 0.5 10*3/uL (ref 0.1–1.0)
Monocytes Relative: 10 %
Neutro Abs: 2.3 10*3/uL (ref 1.7–7.7)
Neutrophils Relative %: 48 %
Platelets: 276 10*3/uL (ref 150–400)
RBC: 4.01 MIL/uL — ABNORMAL LOW (ref 4.22–5.81)
RDW: 12.8 % (ref 11.5–15.5)
WBC: 4.8 10*3/uL (ref 4.0–10.5)
nRBC: 0 % (ref 0.0–0.2)

## 2022-11-28 LAB — BASIC METABOLIC PANEL
Anion gap: 7 (ref 5–15)
BUN: 15 mg/dL (ref 6–20)
CO2: 25 mmol/L (ref 22–32)
Calcium: 8.6 mg/dL — ABNORMAL LOW (ref 8.9–10.3)
Chloride: 105 mmol/L (ref 98–111)
Creatinine, Ser: 0.75 mg/dL (ref 0.61–1.24)
GFR, Estimated: >60 mL/min (ref >60)
Glucose, Bld: 101 mg/dL — ABNORMAL HIGH (ref 70–99)
Potassium: 4.2 mmol/L (ref 3.5–5.1)
Sodium: 137 mmol/L (ref 135–145)

## 2022-11-28 NOTE — Progress Notes (Signed)
For Short Stay: COVID SWAB appointment date:  Bowel Prep reminder:   For Anesthesia: PCP - [name]: NP Cardiologist - Dr. [name]. Clearance: Venda Rodes: 11/01/22 Chest x-ray -  EKG - 11/01/22 Stress Test -  ECHO - 11/07/21 Cardiac Cath - 09/07/21 Pacemaker/ICD device last checked: Pacemaker orders received: Device Rep notified:  Spinal Cord Stimulator: N/A  Sleep Study - N/A CPAP -   Fasting Blood Sugar - N/A Checks Blood Sugar _____ times a day Date and result of last Hgb A1c-  Last dose of GLP1 agonist-  GLP1 instructions:   Last dose of SGLT-2 inhibitors- N/A SGLT-2 instructions:   Blood Thinner Instructions: Coumadin will be hold after 12/11/22 Aspirin Instructions: Last Dose:  Activity level: Can go up a flight of stairs and activities of daily living without stopping and without chest pain and/or shortness of breath   Able to exercise without chest pain and/or shortness of breath Anesthesia review:   Patient denies shortness of breath, fever, cough and chest pain at PAT appointment   Patient verbalized understanding of instructions that were given to them at the PAT appointment. Patient was also instructed that they will need to review over the PAT instructions again at home before surgery.

## 2022-12-03 ENCOUNTER — Ambulatory Visit (HOSPITAL_COMMUNITY): Payer: Commercial Managed Care - PPO | Attending: Physician Assistant

## 2022-12-03 DIAGNOSIS — Z9889 Other specified postprocedural states: Secondary | ICD-10-CM | POA: Diagnosis present

## 2022-12-03 DIAGNOSIS — Z952 Presence of prosthetic heart valve: Secondary | ICD-10-CM

## 2022-12-03 DIAGNOSIS — Z8679 Personal history of other diseases of the circulatory system: Secondary | ICD-10-CM | POA: Diagnosis present

## 2022-12-03 LAB — ECHOCARDIOGRAM COMPLETE
AR max vel: 1.77 cm2
AV Area VTI: 1.83 cm2
AV Area mean vel: 1.71 cm2
AV Mean grad: 11 mmHg
AV Peak grad: 21.3 mmHg
Ao pk vel: 2.31 m/s
Area-P 1/2: 2.56 cm2
S' Lateral: 2.4 cm

## 2022-12-04 NOTE — Anesthesia Preprocedure Evaluation (Addendum)
Anesthesia Evaluation  Patient identified by MRN, date of birth, ID band Patient awake    Reviewed: Allergy & Precautions, NPO status , Patient's Chart, lab work & pertinent test results  Airway Mallampati: II  TM Distance: >3 FB Neck ROM: Full    Dental no notable dental hx. (+) Teeth Intact, Dental Advisory Given   Pulmonary asthma    Pulmonary exam normal breath sounds clear to auscultation       Cardiovascular hypertension, Pt. on home beta blockers and Pt. on medications Normal cardiovascular exam+ Valvular Problems/Murmurs (S/P AVR on coumadin stopped on 7/5)  Rhythm:Regular Rate:Normal  Echo 12/03/22 1. Left ventricular ejection fraction, by estimation, is 65 to 70%. The  left ventricle has normal function. The left ventricle has no regional  wall motion abnormalities. There is mild left ventricular hypertrophy.  Left ventricular diastolic parameters  were normal.   2. Right ventricular systolic function is normal. The right ventricular  size is normal. Tricuspid regurgitation signal is inadequate for assessing  PA pressure.   3. The mitral valve is normal in structure. Trivial mitral valve  regurgitation. No evidence of mitral stenosis.   4. The aortic valve has been repaired/replaced. Aortic valve  regurgitation is trivial. There is a 23 mm On-X mechanical valve present  in the aortic position. Echo findings are consistent with normal structure  and function of the aortic valve prosthesis.   Aortic valve mean gradient measures 11.0 mmHg.   5. Aortic root/ascending aorta has been repaired/replaced. 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 The inferior vena cava is normal in size with greater than 50%  respiratory variability, suggesting right atrial pressure of 3 mmHg.     Neuro/Psych  Headaches    GI/Hepatic ,,,(+)     substance abuse    Endo/Other  negative endocrine ROS    Renal/GU negative  Renal ROS     Musculoskeletal   Abdominal   Peds  Hematology Lab Results      Component                Value               Date                               HGB                      12.7 (L)            11/28/2022                HCT                      37.9 (L)            11/28/2022                PLT                      276                 11/28/2022              Anesthesia Other Findings All: Amoxicillin  Reproductive/Obstetrics                              Anesthesia Physical  Anesthesia Plan  ASA: 2  Anesthesia Plan: General   Post-op Pain Management: Toradol IV (intra-op)*, Tylenol PO (pre-op)*, Regional block* and Minimal or no pain anticipated   Induction: Intravenous  PONV Risk Score and Plan: 3 and Treatment may vary due to age or medical condition, Ondansetron, Midazolam and Dexamethasone  Airway Management Planned: LMA  Additional Equipment: None  Intra-op Plan:   Post-operative Plan:   Informed Consent: I have reviewed the patients History and Physical, chart, labs and discussed the procedure including the risks, benefits and alternatives for the proposed anesthesia with the patient or authorized representative who has indicated his/her understanding and acceptance.     Dental advisory given  Plan Discussed with:   Anesthesia Plan Comments: (See PAT note 12/04/2022  GA w L TAP Block)        Anesthesia Quick Evaluation

## 2022-12-04 NOTE — Progress Notes (Signed)
Mr. Godwin,  Your heart pump function is normal.  No regional wall motion abnormalities (good news).  Mild thickening of the left lower chamber of the heart.  Please be sure to keep a close eye on your blood pressure.  Normal functioning prosthetic aortic valve.  No significant valvular issues.  Sharlene Dory, [name]

## 2022-12-04 NOTE — Progress Notes (Signed)
Anesthesia Chart Review   Case: [id number] Date/Time: 12/17/22 1445   Procedure: OPEN REPAIR LEFT INGUINAL HERNIA WITH MESH (Left)   Anesthesia type: General   Pre-op diagnosis: LEFTL INGUINAL HERNIA   [facility] 02 / [facility]   Surgeons: Gaynelle Adu, MD       DISCUSSION:32 y.o. never smoker with h/o asthma, s/p AV replacement, left inguinal hernia scheduled for above procedure 12/17/2022 with Dr. Gaynelle Adu.   Per cardiology preoperative evaluation 11/01/2022, "Mr. Salo's perioperative risk of a major cardiac event is 0.9% according to the Revised Cardiac Risk Index (RCRI).  Therefore, he is at low risk for perioperative complications.   His functional capacity is good at 6.55 METs according to the [facility] Activity Status Index (DASI). Recommendations: According to ACC/AHA guidelines, no further cardiovascular testing needed.  The patient may proceed to surgery at acceptable risk.   Antiplatelet and/or Anticoagulation Recommendations:   Coumadin can by held for 5 days prior to surgery.  Please resume post op when felt to be safe."  Anticipate pt can proceed with planned procedure barring acute status change.   VS: There were no vitals taken for this visit.  PROVIDERS: [name], NP is PCP   Cardiologist:  Orbie Pyo, MD  LABS: Labs reviewed: Acceptable for surgery. (all labs ordered are listed, but only abnormal results are displayed)  Labs Reviewed - No data to display   IMAGES:   EKG:   CV: Echo 12/03/22 1. Left ventricular ejection fraction, by estimation, is 65 to 70%. The  left ventricle has normal function. The left ventricle has no regional  wall motion abnormalities. There is mild left ventricular hypertrophy.  Left ventricular diastolic parameters  were normal.   2. Right ventricular systolic function is normal. The right ventricular  size is normal. Tricuspid regurgitation signal is inadequate for assessing  PA pressure.   3. The mitral valve  is normal in structure. Trivial mitral valve  regurgitation. No evidence of mitral stenosis.   4. The aortic valve has been repaired/replaced. Aortic valve  regurgitation is trivial. There is a 23 mm On-X mechanical valve present  in the aortic position. Echo findings are consistent with normal structure  and function of the aortic valve prosthesis.   Aortic valve mean gradient measures 11.0 mmHg.   5. Aortic root/ascending aorta has been repaired/replaced. 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 The inferior vena cava is normal in size with greater than 50%  respiratory variability, suggesting right atrial pressure of 3 mmHg.   Past Medical History:  Diagnosis Date   Asthma    Headache    migraines   Heart murmur    Hypokalemia 06/08/2020   Leukocytosis 01/23/2022   Mild aortic stenosis    Pancreatitis    Pneumonia    as a child x several   Substance abuse [facility])     Past Surgical History:  Procedure Laterality Date   AORTIC VALVE REPLACEMENT N/A 09/27/2021   Procedure: AORTIC VALVE REPLACEMENT (AVR) USING ON-X AORTIC VALVE SIZE ;  Surgeon: Loreli Slot, MD;  Location: [facility] OR;  Service: Open Heart Surgery;  Laterality: N/A;   INGUINAL HERNIA REPAIR Right 01/05/2022   Procedure: OPEN REPAIR INCARCERATED RIGHT INGUINAL HERNIA WITH MESH;  Surgeon: Gaynelle Adu, MD;  [facility];  Service: General;  Laterality: Right;  90 MIN - ROOM 2   REPLACEMENT ASCENDING AORTA N/A 09/27/2021   Procedure: REPLACEMENT ASCENDING AND INNOMINATE ANEURYSM USING HEMASHIELD  PLATINUM [id number];  Surgeon: Loreli Slot, MD;  Location: [facility] OR;  Service: Open Heart Surgery;  Laterality: N/A;   RIGHT HEART CATH AND CORONARY ANGIOGRAPHY N/A 09/07/2021   Procedure: RIGHT HEART CATH AND CORONARY ANGIOGRAPHY;  Surgeon: Orbie Pyo, MD;  [facility];  Service: Cardiovascular;  Laterality: N/A;   TEE WITHOUT CARDIOVERSION N/A 09/27/2021   Procedure:  TRANSESOPHAGEAL ECHOCARDIOGRAM (TEE);  Surgeon: Loreli Slot, MD;  Location: [facility] OR;  Service: Open Heart Surgery;  Laterality: N/A;    MEDICATIONS: No current facility-administered medications for this encounter.    acetaminophen (TYLENOL) 325 MG tablet   aspirin EC 81 MG EC tablet   loratadine (CLARITIN) 10 MG tablet   METAMUCIL FIBER PO   metoprolol succinate (TOPROL XL) 25 MG 24 hr tablet   warfarin (COUMADIN) 3 MG tablet    Jodell Cipro Ward, [name] [facility] Pre-Surgical Testing [phone]

## 2022-12-18 NOTE — Progress Notes (Signed)
Patient phoned to give updated information on surgery.  Date of Surgery - 12-20-22  Arrival Time - check in at admitting at 5:15 AM  NPO Status - patient reminded to not eat solid food after midnight.  From midnight until 4:15 AM may have clear liquids  Medications morning of surgery - Claritin and Tylenol if needed.    Patient states last dose of Coumadin was 12-14-22.  No change in medical history, allergies per patient.  Surgery was canceled by the hospital.  Transportation home - patient states that he is working on transportation but will have it arranged by day of surgery.    All questions answered and patient stated understanding

## 2022-12-20 ENCOUNTER — Encounter (HOSPITAL_COMMUNITY): Payer: Self-pay | Admitting: General Surgery

## 2022-12-20 ENCOUNTER — Ambulatory Visit (HOSPITAL_COMMUNITY): Payer: Commercial Managed Care - PPO | Admitting: Physician Assistant

## 2022-12-20 ENCOUNTER — Encounter (HOSPITAL_COMMUNITY)
Admission: RE | Disposition: A | Discharge status: Home / Self Care | Payer: Self-pay | Source: Ambulatory Visit | Attending: General Surgery

## 2022-12-20 ENCOUNTER — Ambulatory Visit (HOSPITAL_COMMUNITY)
Admission: RE | Admit: 2022-12-20 | Discharge: 2022-12-20 | Disposition: A | Discharge status: Home / Self Care | Payer: Commercial Managed Care - PPO | Source: Ambulatory Visit | Attending: General Surgery | Admitting: General Surgery

## 2022-12-20 ENCOUNTER — Other Ambulatory Visit: Payer: Self-pay

## 2022-12-20 DIAGNOSIS — Z7901 Long term (current) use of anticoagulants: Secondary | ICD-10-CM | POA: Diagnosis not present

## 2022-12-20 DIAGNOSIS — K4090 Unilateral inguinal hernia, without obstruction or gangrene, not specified as recurrent: Secondary | ICD-10-CM | POA: Diagnosis not present

## 2022-12-20 DIAGNOSIS — I10 Essential (primary) hypertension: Secondary | ICD-10-CM | POA: Insufficient documentation

## 2022-12-20 DIAGNOSIS — Z952 Presence of prosthetic heart valve: Secondary | ICD-10-CM | POA: Diagnosis not present

## 2022-12-20 HISTORY — PX: INGUINAL HERNIA REPAIR: SHX194

## 2022-12-20 LAB — PROTIME-INR
INR: 1 (ref 0.8–1.2)
Prothrombin Time: 13.1 seconds (ref 11.4–15.2)

## 2022-12-20 LAB — APTT: aPTT: 29 seconds (ref 24–36)

## 2022-12-20 SURGERY — REPAIR, HERNIA, INGUINAL, ADULT
Anesthesia: General | Laterality: Left

## 2022-12-20 MED ORDER — ACETAMINOPHEN 500 MG PO TABS
1000.0000 mg | ORAL_TABLET | Freq: Once | ORAL | Status: AC
  Administered 2022-12-20: 1000 mg via ORAL
  Filled 2022-12-20: qty 2

## 2022-12-20 MED ORDER — PROPOFOL 10 MG/ML IV BOLUS
INTRAVENOUS | Status: DC | PRN
Start: 2022-12-20 — End: 2022-12-20
  Administered 2022-12-20: 200 mg via INTRAVENOUS

## 2022-12-20 MED ORDER — DEXAMETHASONE SODIUM PHOSPHATE 10 MG/ML IJ SOLN
INTRAMUSCULAR | Status: DC | PRN
  Administered 2022-12-20: 8 mg via INTRAVENOUS

## 2022-12-20 MED ORDER — CLONIDINE HCL (ANALGESIA) 100 MCG/ML EP SOLN
EPIDURAL | Status: DC | PRN
  Administered 2022-12-20: 100 ug

## 2022-12-20 MED ORDER — DEXMEDETOMIDINE HCL IN NACL 80 MCG/20ML IV SOLN
INTRAVENOUS | Status: DC | PRN
  Administered 2022-12-20: 16 ug via INTRAVENOUS

## 2022-12-20 MED ORDER — CHLORHEXIDINE GLUCONATE CLOTH 2 % EX PADS: 6.0000 | MEDICATED_PAD | Freq: Once | CUTANEOUS | Status: DC

## 2022-12-20 MED ORDER — ONDANSETRON HCL 4 MG/2ML IJ SOLN
INTRAMUSCULAR | Status: AC
  Filled 2022-12-20: qty 2

## 2022-12-20 MED ORDER — BUPIVACAINE-EPINEPHRINE 0.25% -1:200000 IJ SOLN
INTRAMUSCULAR | Status: AC
  Filled 2022-12-20: qty 1

## 2022-12-20 MED ORDER — LIDOCAINE HCL (PF) 2 % IJ SOLN
INTRAMUSCULAR | Status: AC
  Filled 2022-12-20: qty 5

## 2022-12-20 MED ORDER — MIDAZOLAM HCL 2 MG/2ML IJ SOLN
INTRAMUSCULAR | Status: AC
  Filled 2022-12-20: qty 2

## 2022-12-20 MED ORDER — LACTATED RINGERS IV SOLN: INTRAVENOUS | Status: DC

## 2022-12-20 MED ORDER — LIDOCAINE HCL (CARDIAC) PF 100 MG/5ML IV SOSY
PREFILLED_SYRINGE | INTRAVENOUS | Status: DC | PRN
  Administered 2022-12-20: 100 mg via INTRAVENOUS

## 2022-12-20 MED ORDER — BUPIVACAINE-EPINEPHRINE 0.25% -1:200000 IJ SOLN
INTRAMUSCULAR | Status: DC | PRN
  Administered 2022-12-20: 50 mL

## 2022-12-20 MED ORDER — MIDAZOLAM HCL 5 MG/5ML IJ SOLN
INTRAMUSCULAR | Status: DC | PRN
  Administered 2022-12-20: 2 mg via INTRAVENOUS

## 2022-12-20 MED ORDER — OXYCODONE HCL 5 MG/5ML PO SOLN: 5.0000 mg | Freq: Once | ORAL | Status: DC | PRN

## 2022-12-20 MED ORDER — PROPOFOL 10 MG/ML IV BOLUS
INTRAVENOUS | Status: AC
  Filled 2022-12-20: qty 20

## 2022-12-20 MED ORDER — VANCOMYCIN HCL IN DEXTROSE 1-5 GM/200ML-% IV SOLN
1000.0000 mg | INTRAVENOUS | Status: DC
  Filled 2022-12-20: qty 200

## 2022-12-20 MED ORDER — KETOROLAC TROMETHAMINE 30 MG/ML IJ SOLN: 30.0000 mg | Freq: Once | INTRAMUSCULAR | Status: AC | PRN

## 2022-12-20 MED ORDER — ONDANSETRON HCL 4 MG/2ML IJ SOLN
INTRAMUSCULAR | Status: DC | PRN
  Administered 2022-12-20: 4 mg via INTRAVENOUS

## 2022-12-20 MED ORDER — OXYCODONE HCL 5 MG PO TABS: 5.0000 mg | ORAL_TABLET | Freq: Once | ORAL | Status: DC | PRN

## 2022-12-20 MED ORDER — DEXAMETHASONE SODIUM PHOSPHATE 10 MG/ML IJ SOLN
INTRAMUSCULAR | Status: AC
  Filled 2022-12-20: qty 1

## 2022-12-20 MED ORDER — ONDANSETRON HCL 4 MG/2ML IJ SOLN
4.0000 mg | Freq: Once | INTRAMUSCULAR | Status: AC | PRN
  Administered 2022-12-20: 4 mg via INTRAVENOUS

## 2022-12-20 MED ORDER — 0.9 % SODIUM CHLORIDE (POUR BTL) OPTIME
TOPICAL | Status: DC | PRN
  Administered 2022-12-20: 1000 mL

## 2022-12-20 MED ORDER — ACETAMINOPHEN 325 MG PO TABS: 650.0000 mg | ORAL_TABLET | Freq: Four times a day (QID) | ORAL | Status: AC

## 2022-12-20 MED ORDER — HYDROMORPHONE HCL 1 MG/ML IJ SOLN: 0.2500 mg | INTRAMUSCULAR | Status: DC | PRN

## 2022-12-20 MED ORDER — KETOROLAC TROMETHAMINE 30 MG/ML IJ SOLN
INTRAMUSCULAR | Status: AC
  Administered 2022-12-20: 30 mg via INTRAVENOUS
  Filled 2022-12-20: qty 1

## 2022-12-20 MED ORDER — ROPIVACAINE HCL 5 MG/ML IJ SOLN
INTRAMUSCULAR | Status: DC | PRN
  Administered 2022-12-20: 30 mL

## 2022-12-20 MED ORDER — ORAL CARE MOUTH RINSE: 15.0000 mL | Freq: Once | OROMUCOSAL | Status: AC

## 2022-12-20 MED ORDER — CEFAZOLIN SODIUM-DEXTROSE 2-4 GM/100ML-% IV SOLN
2.0000 g | Freq: Once | INTRAVENOUS | Status: AC
  Administered 2022-12-20: 2 g via INTRAVENOUS

## 2022-12-20 MED ORDER — ACETAMINOPHEN 500 MG PO TABS: 1000.0000 mg | ORAL_TABLET | ORAL | Status: DC

## 2022-12-20 MED ORDER — KETOROLAC TROMETHAMINE 30 MG/ML IJ SOLN
INTRAMUSCULAR | Status: AC
  Filled 2022-12-20: qty 1

## 2022-12-20 MED ORDER — OXYCODONE HCL 5 MG PO TABS: 5.0000 mg | ORAL_TABLET | Freq: Four times a day (QID) | ORAL | 0 refills | Status: DC | PRN

## 2022-12-20 MED ORDER — CHLORHEXIDINE GLUCONATE 0.12 % MT SOLN
15.0000 mL | Freq: Once | OROMUCOSAL | Status: AC
  Administered 2022-12-20: 15 mL via OROMUCOSAL

## 2022-12-20 MED ORDER — FENTANYL CITRATE (PF) 250 MCG/5ML IJ SOLN
INTRAMUSCULAR | Status: AC
  Filled 2022-12-20: qty 5

## 2022-12-20 MED ORDER — FENTANYL CITRATE (PF) 100 MCG/2ML IJ SOLN
INTRAMUSCULAR | Status: DC | PRN
  Administered 2022-12-20: 50 ug via INTRAVENOUS
  Administered 2022-12-20: 100 ug via INTRAVENOUS

## 2022-12-20 SURGICAL SUPPLY — 32 items
ADH SKN CLS APL DERMABOND .7 (GAUZE/BANDAGES/DRESSINGS) ×1
APL SKNCLS STERI-STRIP NONHPOA (GAUZE/BANDAGES/DRESSINGS)
BAG COUNTER SPONGE SURGICOUNT (BAG) IMPLANT
BAG SPNG CNTER NS LX DISP (BAG)
BENZOIN TINCTURE PRP APPL 2/3 (GAUZE/BANDAGES/DRESSINGS) IMPLANT
COVER SURGICAL LIGHT HANDLE (MISCELLANEOUS) ×1 IMPLANT
DERMABOND ADVANCED .7 DNX12 (GAUZE/BANDAGES/DRESSINGS) IMPLANT
DRAIN PENROSE 0.5X18 (DRAIN) IMPLANT
DRAPE LAPAROTOMY T 98X78 PEDS (DRAPES) ×1 IMPLANT
DRSG TEGADERM 4X4.75 (GAUZE/BANDAGES/DRESSINGS) IMPLANT
ELECT REM PT RETURN 15FT ADLT (MISCELLANEOUS) ×1 IMPLANT
GLOVE BIO SURGEON STRL SZ7.5 (GLOVE) ×1 IMPLANT
GLOVE INDICATOR 8.0 STRL GRN (GLOVE) ×1 IMPLANT
GOWN STRL REUS W/ TWL XL LVL3 (GOWN DISPOSABLE) ×2 IMPLANT
GOWN STRL REUS W/TWL XL LVL3 (GOWN DISPOSABLE) ×2
KIT BASIN OR (CUSTOM PROCEDURE TRAY) ×1 IMPLANT
KIT TURNOVER KIT A (KITS) IMPLANT
MARKER SKIN DUAL TIP RULER LAB (MISCELLANEOUS) ×1 IMPLANT
MESH BARD SOFT 3X6IN (Mesh General) IMPLANT
NDL HYPO 22X1.5 SAFETY MO (MISCELLANEOUS) ×1 IMPLANT
NEEDLE HYPO 22X1.5 SAFETY MO (MISCELLANEOUS) ×1 IMPLANT
PACK GENERAL/GYN (CUSTOM PROCEDURE TRAY) ×1 IMPLANT
SPIKE FLUID TRANSFER (MISCELLANEOUS) IMPLANT
STRIP CLOSURE SKIN 1/2X4 (GAUZE/BANDAGES/DRESSINGS) ×1 IMPLANT
SUT MNCRL AB 4-0 PS2 18 (SUTURE) ×1 IMPLANT
SUT PROLENE 2 0 CT2 30 (SUTURE) ×2 IMPLANT
SUT VIC AB 2-0 SH 27 (SUTURE) ×1
SUT VIC AB 2-0 SH 27X BRD (SUTURE) ×1 IMPLANT
SUT VIC AB 3-0 SH 18 (SUTURE) ×1 IMPLANT
SYR 20ML LL LF (SYRINGE) ×1 IMPLANT
TOWEL OR 17X26 10 PK STRL BLUE (TOWEL DISPOSABLE) ×1 IMPLANT
TOWEL OR NON WOVEN STRL DISP B (DISPOSABLE) ×1 IMPLANT

## 2022-12-20 NOTE — Anesthesia Procedure Notes (Signed)
Procedure Name: LMA Insertion Date/Time: 12/20/2022 7:32 AM  Performed by: Shanon Payor, CRNAPre-anesthesia Checklist: Patient identified, Emergency Drugs available, Suction available, Patient being monitored and Timeout performed Patient Re-evaluated:Patient Re-evaluated prior to induction Oxygen Delivery Method: Circle system utilized Preoxygenation: Pre-oxygenation with 100% oxygen Induction Type: IV induction LMA: LMA with gastric port inserted LMA Size: 4.0 Number of attempts: 1 Placement Confirmation: positive ETCO2, CO2 detector and breath sounds checked- equal and bilateral Tube secured with: Tape Dental Injury: Teeth and Oropharynx as per pre-operative assessment

## 2022-12-20 NOTE — Op Note (Signed)
Open Left Indirect Inguinal Hernia Repair with Mesh Procedure Note  Indications: The patient presented with a history of a left, reducible hernia.    Pre-operative Diagnosis: left reducible  Post-operative Diagnosis: left indirect inguinal hernia   Surgeon: Gaynelle Adu MD FACS  Surgery Resident: Clabe Seal MD PGY -4  Anesthesia: General LMA anesthesia + TAP block   Surgeon: Mary Sella. Andrey Campanile, MD, FACS  Procedure Details  The patient was seen again in the Holding Room. The risks, benefits, complications, treatment options, and expected outcomes were discussed with the patient. The possibilities of reaction to medication, pulmonary aspiration, perforation of viscus, bleeding, recurrent infection, the need for additional procedures, and development of a complication requiring transfusion or further operation were discussed with the patient and/or family. The likelihood of success in repairing the hernia and returning the patient to their previous functional status is good.  There was concurrence with the proposed plan, and informed consent was obtained. The site of surgery was properly noted/marked. The patient was taken to the Operating Room, identified as [name] [name], and the procedure verified as left inguinal hernia repair. A Time Out was held and the above information confirmed.  The patient was placed in the supine position and underwent induction of anesthesia. The lower abdomen and groin was prepped with Chloraprep and draped in the standard fashion, and 0.25% Marcaine with epinephrine was used to anesthetize the skin over the mid-portion of the inguinal canal. An oblique incision was made. Dissection was carried down through the subcutaneous tissue with cautery to the external oblique fascia.  We opened the external oblique fascia along the direction of its fibers to the external ring.  The spermatic cord was circumferentially dissected bluntly and retracted with a Penrose  drain.  The floor of the inguinal canal was inspected and there was no direct defect.  We skeletonized the spermatic cord and isolated a small cord lipoma which was discarded and a separated a very large indirect sac. The indirect sac was reduced.   We used a 3 x 6 inch piece of  Bard soft mesh, which was cut into a keyhole shape.  This was secured with 2-0 Prolene, beginning at the pubic tubercle, running this along the shelving edge inferiorly. Superiorly, the mesh was secured to the internal oblique fascia with interrupted 2-0 Prolene sutures.  The tails of the mesh were sutured together x3 behind the spermatic cord.  The mesh was tucked underneath the external oblique fascia laterally.  The external oblique fascia was reapproximated with 2-0 Vicryl.  3-0 Vicryl was used to close the subcutaneous tissues and 4-0 Monocryl was used to close the skin in subcuticular fashion. Dermabond was applied.  The patient was then extubated and brought to the recovery room in stable condition.  All sponge, instrument, and needle counts were correct prior to closure and at the conclusion of the case.   Estimated Blood Loss: Minimal                 Complications: None; patient tolerated the procedure well.         Disposition: PACU - hemodynamically stable.         Condition: stable  I was personally present & scrubbed during the entire procedure (except skin closure) and did some mobilization of structures.  I was immediately available during skin closure, as documented in my operative note.  Mary Sella. Andrey Campanile, MD, FACS General, Bariatric, & Minimally Invasive Surgery [facility], [place]

## 2022-12-20 NOTE — Anesthesia Postprocedure Evaluation (Signed)
Anesthesia Post Note  Patient: Azure Barrales Rexford Sturgeon  Procedure(s) Performed: OPEN REPAIR LEFT INGUINAL HERNIA WITH MESH (Left)     Patient location during evaluation: PACU Anesthesia Type: General Level of consciousness: awake and alert Pain management: pain level controlled Vital Signs Assessment: post-procedure vital signs reviewed and stable Respiratory status: spontaneous breathing, nonlabored ventilation, respiratory function stable and patient connected to nasal cannula oxygen Cardiovascular status: blood pressure returned to baseline and stable Postop Assessment: no apparent nausea or vomiting Anesthetic complications: no   No notable events documented.  Last Vitals:  Vitals:   12/20/22 1008 12/20/22 1015  BP:  (!) 96/52  Pulse: (!) 48 (!) 51  Resp: 15 13  Temp:    SpO2: 98% 98%    Last Pain:  Vitals:   12/20/22 1000  TempSrc:   PainSc: 4                  Trevor Iha

## 2022-12-20 NOTE — Discharge Instructions (Addendum)
UMBILICAL OR INGUINAL HERNIA REPAIR: POST OP INSTRUCTIONS  Always review your discharge instruction sheet given to you by the facility where your surgery was performed. IF YOU HAVE DISABILITY OR FAMILY LEAVE FORMS, YOU MUST BRING THEM TO THE OFFICE FOR PROCESSING.   DO NOT GIVE THEM TO YOUR DOCTOR.  A  prescription for pain medication may be given to you upon discharge.  Take your pain medication as prescribed, if needed.  If narcotic pain medicine is not needed, then you may take acetaminophen (Tylenol) or ibuprofen (Advil) as needed. Take your usually prescribed medications unless otherwise directed. If you need a refill on your pain medication, please contact your pharmacy.  They will contact our office to request authorization. Prescriptions will not be filled after 5 pm or on week-ends. You should follow a light diet the first 24 hours after arrival home, such as soup and crackers, etc.  Be sure to include lots of fluids daily.  Resume your normal diet the day after surgery. Most patients will experience some swelling and bruising around the umbilicus or in the groin and scrotum.  Ice packs and reclining will help.  Swelling and bruising can take several days to resolve.  It is common to experience some constipation if taking pain medication after surgery.  Increasing fluid intake and taking a stool softener (such as Colace) will usually help or prevent this problem from occurring.  A mild laxative (Milk of Magnesia or Miralax) should be taken according to package directions if there are no bowel movements after 48 hours. Unless discharge instructions indicate otherwise, you may remove your bandages 24-48 hours after surgery, and you may shower at that time.  You may have steri-strips (small skin tapes) in place directly over the incision.  These strips should be left on the skin for 7-10 days.  If your surgeon used skin glue on the incision, you may shower in 24 hours.  The glue will flake off  over the next 2-3 weeks.  Any sutures or staples will be removed at the office during your follow-up visit. ACTIVITIES:  You may resume regular (light) daily activities beginning the next day--such as daily self-care, walking, climbing stairs--gradually increasing activities as tolerated.  You may have sexual intercourse when it is comfortable.  Refrain from any heavy lifting or straining until approved by your doctor. You may drive when you are no longer taking prescription pain medication, you can comfortably wear a seatbelt, and you can safely maneuver your car and apply brakes. RETURN TO WORK:  You should see your doctor in the office for a follow-up appointment approximately 2-3 weeks after your surgery.  Make sure that you call for this appointment within a day or two after you arrive home to insure a convenient appointment time. OTHER INSTRUCTIONS: RESUME COUMADIN 7/12 FRIDAY.  YOU WILL NEED TO GET INR CHECKED THIS MONDAY AS SCHEDULED WITH COUMADIN CLINIC     WHEN TO CALL YOUR DOCTOR: Fever over 101.0 Inability to urinate Nausea and/or vomiting Extreme swelling or bruising Continued bleeding from incision. Increased pain, redness, or drainage from the incision  The clinic staff is available to answer your questions during regular business hours.  Please don't hesitate to call and ask to speak to one of the nurses for clinical concerns.  If you have a medical emergency, go to the nearest emergency room or call 911.  A surgeon from [facility] is always on call at the hospital   [address],  [address], [place], [place]  [address] ?  [address] [address], [place], [place]   [address] [phone] ? [phone] ? FAX [phone] Web site: [email or web address]

## 2022-12-20 NOTE — H&P (Signed)
CC: here for surgery  Requesting provider: n/a  HPI: [name] [name] is an 32 y.o. male who is here for open repair of left inguinal hernia repair with mesh. Denies changes since seen in clinic. Stopped coumadin last Friday.  No f/c/n/v/sob/cp.   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   Past Medical History:  Diagnosis Date   Asthma    Headache    migraines   Heart murmur    Hypokalemia 06/08/2020   Leukocytosis 01/23/2022   Mild aortic stenosis    Pancreatitis    Pneumonia    as a child x several   Substance abuse [facility])     Past Surgical History:  Procedure Laterality Date   AORTIC VALVE REPLACEMENT N/A 09/27/2021   Procedure: AORTIC VALVE REPLACEMENT (AVR) USING ON-X AORTIC VALVE SIZE ;  Surgeon: Loreli Slot, MD;  Location: [facility] OR;  Service: Open Heart Surgery;  Laterality: N/A;   INGUINAL HERNIA REPAIR Right 01/05/2022   Procedure: OPEN REPAIR INCARCERATED RIGHT INGUINAL HERNIA WITH MESH;  Surgeon: [name] Adu, MD;  Location[facility];  Service: General;  Laterality: Right;  90 MIN - ROOM 2   REPLACEMENT ASCENDING AORTA N/A 09/27/2021   Procedure: REPLACEMENT ASCENDING AND INNOMINATE ANEURYSM USING HEMASHIELD PLATINUM [id number];  Surgeon: Loreli Slot, MD;  Location: [facility] OR;  Service: Open Heart Surgery;  Laterality: N/A;   RIGHT HEART CATH AND CORONARY ANGIOGRAPHY N/A 09/07/2021   Procedure: RIGHT HEART CATH AND CORONARY ANGIOGRAPHY;  Surgeon: Orbie Pyo, MD;  [facility] LAB;  Service: Cardiovascular;  Laterality: N/A;   TEE WITHOUT CARDIOVERSION N/A 09/27/2021   Procedure: TRANSESOPHAGEAL ECHOCARDIOGRAM (TEE);  Surgeon: Loreli Slot, MD;  Location: [facility] OR;  Service: Open Heart Surgery;  Laterality: N/A;    Family History  Problem Relation Age of Onset   Pancreatitis Maternal Uncle      Social:  reports that he has never smoked. He has never used smokeless tobacco. He reports current alcohol use. He reports that he does not currently use drugs after having used the following drugs: Other-see comments.  Allergies:  Allergies  Allergen Reactions   Amoxicillin Hives    Medications: I have reviewed the patient's current medications.   ROS - all of the below systems have been reviewed with the patient and positives are indicated with bold text General: chills, fever or night sweats Eyes: blurry vision or double vision ENT: epistaxis or sore throat Allergy/Immunology: itchy/watery eyes or nasal congestion Hematologic/Lymphatic: bleeding problems, blood clots or swollen lymph nodes Endocrine: temperature intolerance or unexpected weight changes Breast: new or changing breast lumps or nipple discharge Resp: cough, shortness of breath, or wheezing CV: chest pain or dyspnea on exertion GI: as per HPI GU: dysuria, trouble voiding, or hematuria MSK: joint pain or joint stiffness Neuro: TIA or stroke symptoms Derm: pruritus and skin lesion changes Psych: anxiety and depression  PE Blood pressure 133/88, pulse 67, temperature 97.6 F (36.4 C), temperature source Oral, resp. rate 18, height 5\' 11"  (1.803 m), weight 83.9 kg, SpO2 99%. Constitutional: NAD; conversant; no deformities Eyes: Moist conjunctiva; no lid lag; anicteric; PERRL Neck: Trachea midline; no thyromegaly Lungs: Normal respiratory effort; no tactile fremitus CV: RRR; no palpable thrills; no pitting edema GI: Abd soft, nt, nd; old RT groin incision; small bulge L groin; no palpable hepatosplenomegaly MSK: Normal gait; no clubbing/cyanosis Psychiatric:  Appropriate affect; alert and oriented x3 Lymphatic: No palpable cervical or axillary lymphadenopathy Skin:no rash/lesions  Results for orders placed or performed during the hospital encounter of 12/20/22 (from the past 48 hour(s))  Protime-INR      Status: None   Collection Time: 12/20/22  5:43 AM  Result Value Ref Range   Prothrombin Time 13.1 11.4 - 15.2 seconds   INR 1.0 0.8 - 1.2    Comment: (NOTE) INR goal varies based on device and disease states. Performed at [facility], [address], [place], [place] [address]   PTT Day of Surgery per protocol     Status: None   Collection Time: 12/20/22  5:43 AM  Result Value Ref Range   aPTT 29 24 - 36 seconds    Comment: Performed at [facility], [address] [place], [place] [address]    No results found.  Imaging: CT scan back in the fall when he had another episode of pancreatitis that showed a left inguinal hernia containing nonobstructed sigmoid colon   A/P: [name] [name] is an 32 y.o. male with  Non-recurrent left unilateral inguinal hernia without obstruction or gangrene  Anticoagulated on Coumadin  Mechanical heart valve present   ERAS TAP block IV abx on call Discussed [facility] resident involvement in case and pt gave permission.  All questsions asked/answered  Mary Sella. Andrey Campanile, MD, FACS General, Bariatric, & Minimally Invasive Surgery [facility]

## 2022-12-20 NOTE — Transfer of Care (Signed)
Immediate Anesthesia Transfer of Care Note  Patient: [name] [name]  Procedure(s) Performed: OPEN REPAIR LEFT INGUINAL HERNIA WITH MESH (Left)  Patient Location: PACU  Anesthesia Type:General  Level of Consciousness: awake, alert , oriented, and patient cooperative  Airway & Oxygen Therapy: Patient Spontanous Breathing and Patient connected to face mask oxygen  Post-op Assessment: Report given to RN, Post -op Vital signs reviewed and stable, and Patient moving all extremities X 4  Post vital signs: Reviewed and stable  Last Vitals:  Vitals Value Taken Time  BP 118/72 12/20/22 0907  Temp    Pulse 58 12/20/22 0912  Resp 13 12/20/22 0912  SpO2 100 % 12/20/22 0912  Vitals shown include unfiled device data.  Last Pain:  Vitals:   12/20/22 0610  TempSrc: Oral  PainSc:       Patients Stated Pain Goal: 4 (12/20/22 0534)  Complications: No notable events documented.

## 2022-12-20 NOTE — Anesthesia Procedure Notes (Signed)
Anesthesia Regional Block: TAP block   Pre-Anesthetic Checklist: , timeout performed,  Correct Patient, Correct Site, Correct Laterality,  Correct Procedure, Correct Position, site marked,  Risks and benefits discussed,  At surgeon's request and post-op pain management  Laterality: Left and Lower  Prep: chloraprep       Needles:  Injection technique: Single-shot  Needle Type: Echogenic Needle     Needle Length: 9cm  Needle Gauge: 20     Additional Needles:   Procedures:,,,, ultrasound used (permanent image in chart),,    Narrative:  Start time: 12/20/2022 7:05 AM End time: 12/20/2022 7:10 AM  Performed by: Personally

## 2022-12-21 ENCOUNTER — Encounter (HOSPITAL_COMMUNITY): Payer: Self-pay | Admitting: General Surgery

## 2022-12-24 ENCOUNTER — Ambulatory Visit: Payer: Commercial Managed Care - PPO

## 2022-12-28 ENCOUNTER — Ambulatory Visit: Payer: Commercial Managed Care - PPO | Admitting: *Deleted

## 2022-12-28 DIAGNOSIS — Z952 Presence of prosthetic heart valve: Secondary | ICD-10-CM

## 2022-12-28 DIAGNOSIS — Z7901 Long term (current) use of anticoagulants: Secondary | ICD-10-CM | POA: Diagnosis not present

## 2022-12-28 LAB — POCT INR: INR: 1.4 — AB (ref 2.0–3.0)

## 2022-12-28 NOTE — Patient Instructions (Signed)
Description   Today take 6mg  (2 tablets) then continue taking warfarin 4.5mg  daily except for 6mg  on Mondays.  Continue eating 5 serving of greens weekly. Coumadin Clinic [phone]; INR in 2 weeks (normally 6 weeks).

## 2023-01-11 ENCOUNTER — Ambulatory Visit: Payer: Commercial Managed Care - PPO

## 2023-01-15 ENCOUNTER — Telehealth: Payer: Self-pay

## 2023-01-15 ENCOUNTER — Telehealth: Payer: Self-pay | Admitting: *Deleted

## 2023-01-15 ENCOUNTER — Ambulatory Visit: Payer: Commercial Managed Care - PPO | Attending: Cardiovascular Disease

## 2023-01-15 NOTE — Telephone Encounter (Signed)
done

## 2023-01-15 NOTE — Telephone Encounter (Signed)
Called pt since his Anticoagulation Appt today; there was no answer s left a message. Will await a call back & follow up.

## 2023-01-30 ENCOUNTER — Ambulatory Visit: Payer: Commercial Managed Care - PPO | Attending: Cardiovascular Disease

## 2023-01-30 DIAGNOSIS — Z952 Presence of prosthetic heart valve: Secondary | ICD-10-CM

## 2023-01-30 DIAGNOSIS — Z5181 Encounter for therapeutic drug level monitoring: Secondary | ICD-10-CM

## 2023-01-30 LAB — POCT INR: INR: 1.4 — AB (ref 2.0–3.0)

## 2023-01-30 NOTE — Patient Instructions (Addendum)
 Description   Today take 6mg  (2 tablets) then continue taking warfarin 4.5mg  daily except for 6mg  on Mondays.  Continue eating 5 serving of greens weekly. Coumadin Clinic [phone]; INR in 2 weeks (normally 6 weeks).

## 2023-02-07 NOTE — Progress Notes (Signed)
Cardiology Office Note:   Date:  02/15/2023  ID:  [name] [name] [place], DOB 08-23-1990, MRN [id number] PCP:  [name], NP  [facility] Providers Cardiologist:  [name], MD Referring MD: [name], NP   Chief Complaint/Reason for Referral: General cardiology follow-up ASSESSMENT:    1. S/P AVR (aortic valve replacement) and aortoplasty   2. Primary hypertension   3. General medical examination     PLAN:   In order of problems listed above: 1.  Status post aortic valve replacement: Continue Coumadin for INR goal of 1.5 and aspirin 81 mg indefinitely.  Patient will have INR check later today and is Coumadin level is managed by the Coumadin clinic. 2.  Hypertension: Blood pressure is well-controlled today.  Continue beta-blocker. 3.  General Medical examination: Check lipid panel and LP(a) today.             Dispo:  Return in about 9 months (around 11/15/2023).      Medication Adjustments/Labs and Tests Ordered: Current medicines are reviewed at length with the patient today.  Concerns regarding medicines are outlined above.  The following changes have been made:  no change   Labs/tests ordered: Orders Placed This Encounter  Procedures   Lipoprotein A (LPA)   Lipid panel    Medication Changes: No orders of the defined types were placed in this encounter.   Current medicines are reviewed at length with the patient today.  The patient does not have concerns regarding medicines.  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 male with the indicated medical history here for cardiology follow-up.  The patient was last seen in our clinic in May prior to elective hernia repair.  His blood pressure was noted to be elevated at that appointment.  His Coumadin was held for 5 days prior to the procedure.  He had his procedure done in July.  The patient is doing very well.  He is quite active and is lost a number of pounds.  He feels better than he did prior to his surgery.  He has been swimming on a regular basis and is thinking about changing to a different activity.  He is back working full-time.  He has had no cardiovascular issues recently.  He did note a few elevated blood pressures around the time of his hernia surgery.  He has had no severe bleeding or bruising episodes, need for hospitalization, or need for ER evaluation.  Current Medications: Current Meds  Medication Sig   aspirin EC 81 MG EC tablet Take 1 tablet (81 mg total) by mouth daily. Swallow whole.   loratadine (CLARITIN) 10 MG tablet Take 10 mg by mouth daily.   METAMUCIL FIBER PO Take 10 mLs by mouth every other day.   metoprolol succinate (TOPROL XL) 25 MG 24 hr tablet Take 0.5 tablets (12.5 mg total) by mouth at bedtime.   warfarin (COUMADIN) 3 MG tablet TAKE 1 AND 1/2 TABLETS BY MOUTH DAILY. EXCEPT TAKE 2 TABLETS ON MONDAYS, WEDNESDAYS AND FRIDAYS OR AS DIRECTED BY COAGULATION CLINIC (Patient taking differently: Take 4.5-6 mg by  mouth daily. Take 6 mg by mouth on Mondays. Take 4.5 mg by mouth on Tuesday, Wednesday, Thursday, Friday, Saturday, and Sunday.)     Allergies:    Amoxicillin   Social History:   Social History   Tobacco Use   Smoking status: Never   Smokeless tobacco: Never  Vaping Use   Vaping status: Never Used  Substance Use Topics   Alcohol use: Yes    Comment: 10 drinks a week wine   Drug use: Not Currently    Types: Other-see comments    Comment: Edibles occasionally  marijuana     Family Hx: Family History  Problem Relation Age of Onset   Pancreatitis Maternal Uncle      Review of Systems:   Please see the history of present illness.    All other systems reviewed and are negative.     EKGs/Labs/Other Test Reviewed:   EKG: EKG that I personally reviewed from May 2024 demonstrates normal sinus rhythm  EKG Interpretation Date/Time:    Ventricular Rate:    PR Interval:    QRS Duration:    QT Interval:    QTC Calculation:   R Axis:      Text Interpretation:          Prior CV studies reviewed: Cardiac Studies & Procedures   CARDIAC CATHETERIZATION  CARDIAC CATHETERIZATION 09/07/2021  Narrative 1.  Normal right dominant circulation. 2.  Normal cardiac output and index with with normal filling pressures with a mean PA pressure of 15 mmHg, wedge pressure of 11 mmHg, and mean right atrial pressure of 4 mmHg.  Recommendation: Cardiothoracic surgery evaluation.  Findings Coronary Findings Diagnostic  Dominance: Right  No diagnostic findings have been documented. Intervention  No interventions have been documented.     ECHOCARDIOGRAM  ECHOCARDIOGRAM COMPLETE 12/03/2022  Narrative ECHOCARDIOGRAM REPORT    Patient Name:   [name] [name] Date of Exam: 12/03/2022 Medical Rec #:  [id number]                      Height:       71.0 in Accession #:    [id number]                     Weight:       185.0 lb Date of Birth:  02/14/1991                       BSA:          2.040 m Patient Age:    31 years                       BP:           154/98 mmHg Patient Gender: M                              HR:           69  bpm. Exam Location:  [facility]  Procedure: 2D Echo, Cardiac Doppler, Color Doppler and 3D Echo  Indications:     Z95.2 S/P Aortic valve replacement (ON-X 23mm)  History:         Patient has prior history of Echocardiogram examinations, most recent 11/07/2021. Signs/Symptoms:Murmur. Aortic stenosis. Asthma.  History of aortic aneurysm repair. Bicuspid aorttic valve. Aortic Valve: 23 mm On-X mechanical valve is present in the aortic position.  Sonographer:     Cathie Beams RCS Referring Phys:  Bernadette Hoit CONTE Diagnosing Phys: Epifanio Lesches MD  IMPRESSIONS   1. Left ventricular ejection fraction, by estimation, is 65 to 70%. The left ventricle has normal function. The left ventricle has no regional wall motion abnormalities. There is mild left ventricular hypertrophy. Left ventricular diastolic parameters were normal. 2. Right ventricular systolic function is normal. The right ventricular size is normal. Tricuspid regurgitation signal is inadequate for assessing PA pressure. 3. The mitral valve is normal in structure. Trivial mitral valve regurgitation. No evidence of mitral stenosis. 4. The aortic valve has been repaired/replaced. Aortic valve regurgitation is trivial. There is a 23 mm On-X mechanical valve present in the aortic position. Echo findings are consistent with normal structure and function of the aortic valve prosthesis. Aortic valve mean gradient measures 11.0 mmHg. 5. Aortic root/ascending aorta has been repaired/replaced. Hemashield graft repair of the ascending aorta 6. The inferior vena cava is normal in size with greater than 50% respiratory variability, suggesting right atrial pressure of 3 mmHg.  FINDINGS Left Ventricle: Left ventricular ejection fraction, by estimation, is 65 to 70%. The left ventricle has normal function. The left ventricle has no regional wall motion abnormalities. The left ventricular internal cavity size was normal in size. There is mild left ventricular hypertrophy. Left ventricular diastolic parameters were normal.  Right Ventricle: The right ventricular size is normal. No increase in right ventricular wall thickness. Right ventricular systolic function is normal. Tricuspid regurgitation signal is inadequate for assessing PA pressure.  Left  Atrium: Left atrial size was normal in size.  Right Atrium: Right atrial size was normal in size.  Pericardium: There is no evidence of pericardial effusion.  Mitral Valve: The mitral valve is normal in structure. Trivial mitral valve regurgitation. No evidence of mitral valve stenosis.  Tricuspid Valve: The tricuspid valve is normal in structure. Tricuspid valve regurgitation is trivial.  Aortic Valve: The aortic valve has been repaired/replaced. Aortic valve regurgitation is trivial. Aortic valve mean gradient measures 11.0 mmHg. Aortic valve peak gradient measures 21.3 mmHg. Aortic valve area, by VTI measures 1.83 cm. There is a 23 mm On-X mechanical valve present in the aortic position. Echo findings are consistent with normal structure and function of the aortic valve prosthesis.  Pulmonic Valve: The pulmonic valve was not well visualized. Pulmonic valve regurgitation is trivial.  Aorta: The aortic root/ascending aorta has been repaired/replaced.  Venous: The inferior vena cava is normal in size with greater than 50% respiratory variability, suggesting right atrial pressure of 3 mmHg.  IAS/Shunts: The interatrial septum was not well visualized.   LEFT VENTRICLE PLAX 2D LVIDd:         4.70 cm   Diastology LVIDs:         2.40 cm   LV e' medial:    9.57 cm/s LV PW:         1.20 cm   LV E/e' medial:  8.1 LV IVS:        1.20 cm   LV e' lateral:   11.70 cm/s LVOT diam:     2.30 cm   LV E/e' lateral: 6.6 LV SV:         85 LV SV Index:   42 LVOT Area:     4.15 cm  3D Volume EF: 3D EF:        64 % LV EDV:       168 ml LV ESV:  61 ml LV SV:        107 ml  RIGHT VENTRICLE RV Basal diam:  2.80 cm RV S prime:     12.10 cm/s TAPSE (M-mode): 1.7 cm  LEFT ATRIUM             Index        RIGHT ATRIUM           Index LA diam:        3.10 cm 1.52 cm/m   RA Area:     15.50 cm LA Vol (A2C):   28.2 ml 13.82 ml/m  RA Volume:   34.80 ml  17.06 ml/m LA Vol (A4C):   29.0 ml 14.22  ml/m LA Biplane Vol: 29.7 ml 14.56 ml/m AORTIC VALVE                     PULMONIC VALVE AV Area (Vmax):    1.77 cm      PR End Diast Vel: 4.67 msec AV Area (Vmean):   1.71 cm AV Area (VTI):     1.83 cm AV Vmax:           231.00 cm/s AV Vmean:          156.000 cm/s AV VTI:            0.466 m AV Peak Grad:      21.3 mmHg AV Mean Grad:      11.0 mmHg LVOT Vmax:         98.20 cm/s LVOT Vmean:        64.200 cm/s LVOT VTI:          0.205 m LVOT/AV VTI ratio: 0.44  AORTA Ao Asc diam: 3.00 cm  MITRAL VALVE MV Area (PHT): 2.56 cm    SHUNTS MV Decel Time: 296 msec    Systemic VTI:  0.20 m MV E velocity: 77.60 cm/s  Systemic Diam: 2.30 cm MV A velocity: 92.20 cm/s MV E/A ratio:  0.84  Epifanio Lesches MD Electronically signed by Epifanio Lesches MD Signature Date/Time: 12/03/2022/10:40:02 PM    Final (Updated)   TEE  ECHO INTRAOPERATIVE TEE 09/27/2021  Narrative *INTRAOPERATIVE TRANSESOPHAGEAL REPORT *    Patient Name:   YAQOUB CANDELARIA [facility] Date of Exam: 09/27/2021 Medical Rec #:  [id number]                      Height:       71.0 in Accession #:    [id number]                     Weight:       195.0 lb Date of Birth:  1990-07-03                       BSA:          2.09 m Patient Age:    32 years                       BP:           130/76 mmHg Patient Gender: M                              HR:           57 bpm. Exam Location:  Anesthesiology  Transesophogeal exam was perform intraoperatively during surgical procedure.  Patient was closely monitored under general anesthesia during the entirety of examination.  Indications:     Aortic Valve Disease Sonographer:     Eulah Pont RDCS Performing Phys: Leslye Peer MD Diagnosing Phys: Leslye Peer MD  Complications: No known complications during this procedure. POST-OP IMPRESSIONS _ Left Ventricle: The left ventricle is unchanged from pre-bypass. _ Right Ventricle: The right ventricle appears  unchanged from pre-bypass. _ Aorta: A graft was placed in the ascending aorta for repair. _ Left Atrium: The left atrium appears unchanged from pre-bypass. _ Left Atrial Appendage: The left atrial appendage appears unchanged from pre-bypass. _ Aortic Valve: A bileaflet mechanical valve was placed Manufactured by; On-X Size; 23mm. No regurgitation post repair. No perivalvular leak noted. _ Mitral Valve: The mitral valve appears unchanged from pre-bypass. _ Tricuspid Valve: The tricuspid valve appears unchanged from pre-bypass. _ Pulmonic Valve: The pulmonic valve appears unchanged from pre-bypass. _ Interatrial Septum: The interatrial septum appears unchanged from pre-bypass. _ Interventricular Septum: The interventricular septum appears unchanged from pre-bypass. _ Pericardium: The pericardium appears unchanged from pre-bypass.  PRE-OP FINDINGS Left Ventricle: The left ventricle has normal systolic function, with an ejection fraction of 60-65%. The cavity size was normal. There is no left ventricular hypertrophy.   Right Ventricle: The right ventricle has normal systolic function. The cavity was normal. There is no increase in right ventricular wall thickness.  Left Atrium: Left atrial size was normal in size. No left atrial/left atrial appendage thrombus was detected.  Right Atrium: Right atrial size was normal in size.  Interatrial Septum: No atrial level shunt detected by color flow Doppler.  Pericardium: There is no evidence of pericardial effusion.  Mitral Valve: The mitral valve is normal in structure. Mitral valve regurgitation is trivial by color flow Doppler. There is No evidence of mitral stenosis.  Tricuspid Valve: The tricuspid valve was normal in structure. Tricuspid valve regurgitation was not visualized by color flow Doppler. No evidence of tricuspid stenosis is present.  Aortic Valve: The aortic valve is bicuspid Aortic valve regurgitation is moderate by color flow  Doppler. The jet is eccentric. There is moderate stenosis of the aortic valve.   Pulmonic Valve: The pulmonic valve was normal in structure. Pulmonic valve regurgitation is not visualized by color flow Doppler.   Aorta: The is normal in size and structure. There is moderate dilatation of the ascending aorta.  +-------------+---------++ AORTIC VALVE           +-------------+---------++ AV Mean Grad:14.0 mmHg +-------------+---------++   Leslye Peer MD Electronically signed by Leslye Peer MD Signature Date/Time: 09/27/2021/3:12:13 PM    Final            Recent Labs: 08/03/2022: ALT 17; TSH 1.42 11/28/2022: BUN 15; Creatinine, Ser 0.75; Hemoglobin 12.7; Platelets 276; Potassium 4.2; Sodium 137   Lipid Panel    Component Value Date/Time   CHOL 163 08/03/2022 0826   TRIG 77.0 08/03/2022 0826   HDL 50.00 08/03/2022 0826   CHOLHDL 3 08/03/2022 0826   VLDL 15.4 08/03/2022 0826   LDLCALC 98 08/03/2022 0826    Risk Assessment/Calculations:          Physical Exam:   VS:  BP 120/80   Pulse 76   Ht 5\' 11"  (1.803 m)   Wt 179 lb (81.2 kg)   SpO2 99%   BMI 24.97 kg/m        Wt Readings from Last 3 Encounters:  02/15/23 179 lb (81.2 kg)  12/20/22 185 lb (83.9 kg)  11/28/22 185 lb (83.9 kg)      GENERAL:  No apparent distress, AOx3 HEENT:  No carotid bruits, +2 carotid impulses, no scleral icterus CAR: RRR mechanical S2  RES:  Clear to auscultation bilaterally ABD:  Soft, nontender, nondistended, positive bowel sounds x 4 VASC:  +2 radial pulses, +2 carotid pulses NEURO:  CN 2-12 grossly intact; motor and sensory grossly intact PSYCH:  No active depression or anxiety EXT:  No edema, ecchymosis, or cyanosis  Signed, Orbie Pyo, MD  02/15/2023 2:12 PM    [facility] [address] [place], [place], [place]  [address] Phone: [phone]; Fax: [phone]   Note:  This document was prepared using Dragon voice recognition software and  may include unintentional dictation errors.

## 2023-02-15 ENCOUNTER — Ambulatory Visit: Payer: Commercial Managed Care - PPO | Attending: Internal Medicine | Admitting: Internal Medicine

## 2023-02-15 ENCOUNTER — Ambulatory Visit (INDEPENDENT_AMBULATORY_CARE_PROVIDER_SITE_OTHER): Payer: Commercial Managed Care - PPO

## 2023-02-15 ENCOUNTER — Encounter: Payer: Self-pay | Admitting: Internal Medicine

## 2023-02-15 VITALS — BP 120/80 | HR 76 | Ht 71.0 in | Wt 179.0 lb

## 2023-02-15 DIAGNOSIS — Z952 Presence of prosthetic heart valve: Secondary | ICD-10-CM

## 2023-02-15 DIAGNOSIS — Z0000 Encounter for general adult medical examination without abnormal findings: Secondary | ICD-10-CM | POA: Diagnosis not present

## 2023-02-15 DIAGNOSIS — I10 Essential (primary) hypertension: Secondary | ICD-10-CM

## 2023-02-15 DIAGNOSIS — Z7901 Long term (current) use of anticoagulants: Secondary | ICD-10-CM

## 2023-02-15 LAB — POCT INR: INR: 2.2 (ref 2.0–3.0)

## 2023-02-15 NOTE — Patient Instructions (Signed)
continue taking warfarin 4.5mg  daily except for 6mg  on Mondays.  Continue eating 5 serving of greens weekly.  Coumadin Clinic [phone]; INR in 4 weeks (normally 6 weeks).

## 2023-02-15 NOTE — Patient Instructions (Signed)
Medication Instructions:  No changes *If you need a refill on your cardiac medications before your next appointment, please call your pharmacy*   Lab Work: Today: lipids, Lp(a) If you have labs (blood work) drawn today and your tests are completely normal, you will receive your results only by: MyChart Message (if you have MyChart) OR A paper copy in the mail If you have any lab test that is abnormal or we need to change your treatment, we will call you to review the results.   Testing/Procedures: none   Follow-Up: At [facility], you and your health needs are our priority.  As part of our continuing mission to provide you with exceptional heart care, we have created designated Provider Care Teams.  These Care Teams include your primary Cardiologist (physician) and Advanced Practice Providers (APPs -  Physician Assistants and Nurse Practitioners) who all work together to provide you with the care you need, when you need it.   Your next appointment:   9 month(s)  Provider:   Jari Favre, [name], Ronie Spies, [name], Robin Searing, NP, Jacolyn Reedy, [name], Eligha Bridegroom, NP, Tereso Newcomer, [name], or Perlie Gold, [name]

## 2023-02-17 LAB — LIPID PANEL
Chol/HDL Ratio: 3.5 ratio (ref 0.0–5.0)
Cholesterol, Total: 188 mg/dL (ref 100–199)
HDL: 53 mg/dL (ref >39)
LDL Chol Calc (NIH): 107 mg/dL — ABNORMAL HIGH (ref 0–99)
Triglycerides: 163 mg/dL — ABNORMAL HIGH (ref 0–149)
VLDL Cholesterol Cal: 28 mg/dL (ref 5–40)

## 2023-02-17 LAB — LIPOPROTEIN A (LPA): Lipoprotein (a): 92.5 nmol/L — ABNORMAL HIGH (ref <75.0)

## 2023-02-22 ENCOUNTER — Telehealth: Payer: Self-pay | Admitting: *Deleted

## 2023-02-22 DIAGNOSIS — E785 Hyperlipidemia, unspecified: Secondary | ICD-10-CM

## 2023-02-22 NOTE — Telephone Encounter (Addendum)
-----   Message from Orbie Pyo sent at 02/18/2023  8:03 AM EDT ----- Lipid panel in 6 months.     Order and recall placed.

## 2023-03-05 ENCOUNTER — Ambulatory Visit (INDEPENDENT_AMBULATORY_CARE_PROVIDER_SITE_OTHER): Payer: Commercial Managed Care - PPO | Admitting: Family

## 2023-03-05 ENCOUNTER — Other Ambulatory Visit: Payer: Self-pay

## 2023-03-05 ENCOUNTER — Telehealth: Payer: Self-pay | Admitting: Family

## 2023-03-05 ENCOUNTER — Encounter: Payer: Self-pay | Admitting: Family

## 2023-03-05 VITALS — BP 142/89 | HR 72 | Temp 98.0°F | Ht 71.0 in | Wt 171.6 lb

## 2023-03-05 DIAGNOSIS — K219 Gastro-esophageal reflux disease without esophagitis: Secondary | ICD-10-CM | POA: Diagnosis not present

## 2023-03-05 DIAGNOSIS — F418 Other specified anxiety disorders: Secondary | ICD-10-CM | POA: Diagnosis not present

## 2023-03-05 MED ORDER — PANTOPRAZOLE SODIUM 20 MG PO TBEC: 20.0000 mg | DELAYED_RELEASE_TABLET | Freq: Every day | ORAL | 1 refills | Status: DC

## 2023-03-05 MED ORDER — PANTOPRAZOLE SODIUM 20 MG PO TBEC
20.0000 mg | DELAYED_RELEASE_TABLET | Freq: Every day | ORAL | 1 refills | Status: DC
Start: 2023-03-05 — End: 2023-03-05

## 2023-03-05 NOTE — Telephone Encounter (Signed)
Patient called to check in on rx since pharmacy informed him they don't see the rx order for it. Upon me telling pt it had been sent in and confirmed as received by pharmacy at 12:07, he decided he wanted to change pharmacies. Can protonix be sent to [facility] [address], [place], [place] [address]?

## 2023-03-05 NOTE — Progress Notes (Signed)
Patient ID: [name] [name], male    DOB: [birth year], 32 y.o.   MRN: [id number]  Chief Complaint  Patient presents with   GI Problem    Pt c/o Abdominal pain, heartburn, nausea and vomiting. Pt has not eaten in 2 days but has been drinking fluids.  Has tried pepto bismol which does not help. Pt has not been able to go to work.    *Discussed the use of AI scribe software for clinical note transcription with the patient, who gave verbal consent to proceed.  History of Present Illness   The patient, with a history of heart disease and a recent hernia repair, presents with new onset abdominal pain and unintentional weight loss. The abdominal pain, described as 'pretty bad,' is located in the middle of the abdomen and comes in waves. The pain is not associated with food intake and is sometimes accompanied by nausea. The patient vomited yesterday, but denies any changes in bowel habits or stool appearance.  Over the past two to three weeks, the patient has noticed a decrease in appetite and has lost eight pounds since their cardiology appointment at the beginning of the month. They also report a history of heartburn, which has been easily managed with over-the-counter medications like Tums and Pepto-Bismol.  The patient has been under increased stress over the past month and has recently gone through a breakup. They have also been consuming more gluten than usual, despite it being largely absent from their diet for years. They deny any food allergies, but were allergic to eggs as a child.     Assessment & Plan:     Gastrointestinal Discomfort - Recent onset of abdominal pain, nausea, and vomiting. Decreased appetite and unintentional weight loss over the past month. Possible stress-related gastritis or GERD. No alarming symptoms such as hematemesis or melena. Recent increase in gluten intake. -Prescribe omeprazole 20mg  twice daily for two weeks, then once daily for a week, then every  other day as symptoms improve. -Advise to monitor for any worsening symptoms or alarming features such as hematemesis or melena. -Consider reducing gluten & acidic food intake and managing stress levels. -advised to RTO if sx are not improved after 2w  Stress and Anxiety - Recent increase in stress levels due to personal life events. No current engagement with therapy or counseling. -Consider referral for therapy or counseling if stress levels continue to be high.     Subjective:    Outpatient Medications Prior to Visit  Medication Sig Dispense Refill   aspirin EC 81 MG EC tablet Take 1 tablet (81 mg total) by mouth daily. Swallow whole. 30 tablet 11   loratadine (CLARITIN) 10 MG tablet Take 10 mg by mouth daily.     METAMUCIL FIBER PO Take 10 mLs by mouth every other day.     metoprolol succinate (TOPROL XL) 25 MG 24 hr tablet Take 0.5 tablets (12.5 mg total) by mouth at bedtime. 45 tablet 3   warfarin (COUMADIN) 3 MG tablet TAKE 1 AND 1/2 TABLETS BY MOUTH DAILY. EXCEPT TAKE 2 TABLETS ON MONDAYS, WEDNESDAYS AND FRIDAYS OR AS DIRECTED BY COAGULATION CLINIC (Patient taking differently: Take 4.5-6 mg by mouth daily. Take 6 mg by mouth on Mondays. Take 4.5 mg by mouth on Tuesday, Wednesday, Thursday, Friday, Saturday, and Sunday.) 45 tablet 3   No facility-administered medications prior to visit.   Past Medical History:  Diagnosis Date   Asthma    Headache    migraines  Heart murmur    Hypokalemia 06/08/2020   Leukocytosis 01/23/2022   Mild aortic stenosis    Pancreatitis    Pneumonia    as a child x several   Substance abuse [facility])    Past Surgical History:  Procedure Laterality Date   AORTIC VALVE REPLACEMENT N/A 09/27/2021   Procedure: AORTIC VALVE REPLACEMENT (AVR) USING ON-X AORTIC VALVE SIZE ;  Surgeon: Loreli Slot, MD;  Location[facility] OR;  Service: Open Heart Surgery;  Laterality: N/A;   INGUINAL HERNIA REPAIR Right 01/05/2022   Procedure: OPEN REPAIR  INCARCERATED RIGHT INGUINAL HERNIA WITH MESH;  Surgeon: Gaynelle Adu, MD;  Location: [facility] ORS;  Service: General;  Laterality: Right;  90 MIN - ROOM 2   INGUINAL HERNIA REPAIR Left 12/20/2022   Procedure: OPEN REPAIR LEFT INGUINAL HERNIA WITH MESH;  Surgeon: Gaynelle Adu, MD;  Location: [facility] ORS;  Service: General;  Laterality: Left;   REPLACEMENT ASCENDING AORTA N/A 09/27/2021   Procedure: REPLACEMENT ASCENDING AND INNOMINATE ANEURYSM USING HEMASHIELD PLATINUM [id number];  Surgeon: Loreli Slot, MD;  Location: [facility] OR;  Service: Open Heart Surgery;  Laterality: N/A;   RIGHT HEART CATH AND CORONARY ANGIOGRAPHY N/A 09/07/2021   Procedure: RIGHT HEART CATH AND CORONARY ANGIOGRAPHY;  Surgeon: Orbie Pyo, MD;  Location: [facility] INVASIVE CV LAB;  Service: Cardiovascular;  Laterality: N/A;   TEE WITHOUT CARDIOVERSION N/A 09/27/2021   Procedure: TRANSESOPHAGEAL ECHOCARDIOGRAM (TEE);  Surgeon: Loreli Slot, MD;  Location: [facility] OR;  Service: Open Heart Surgery;  Laterality: N/A;   Allergies  Allergen Reactions   Amoxicillin Hives      Objective:    Physical Exam Vitals and nursing note reviewed.  Constitutional:      General: He is not in acute distress.    Appearance: Normal appearance.  HENT:     Head: Normocephalic.  Cardiovascular:     Rate and Rhythm: Normal rate and regular rhythm.  Pulmonary:     Effort: Pulmonary effort is normal.     Breath sounds: Normal breath sounds.  Musculoskeletal:        General: Normal range of motion.     Cervical back: Normal range of motion.  Skin:    General: Skin is warm and dry.  Neurological:     Mental Status: He is alert and oriented to person, place, and time.  Psychiatric:        Mood and Affect: Mood normal.    BP (!) 142/89 (BP Location: Left Arm, Patient Position: Sitting, Cuff Size: Normal)   Pulse 72   Temp 98 F (36.7 C) (Temporal)   Ht 5\' 11"  (1.803 m)   Wt 171 lb 9.6 oz (77.8 kg)   SpO2 99%   BMI 23.93 kg/m  Wt  Readings from Last 3 Encounters:  03/05/23 171 lb 9.6 oz (77.8 kg)  02/15/23 179 lb (81.2 kg)  12/20/22 185 lb (83.9 kg)       [name], NP

## 2023-03-06 NOTE — Telephone Encounter (Signed)
LVM informing pt of medication being re-sent to requested pharmacy.

## 2023-03-12 ENCOUNTER — Encounter (HOSPITAL_COMMUNITY): Payer: Self-pay

## 2023-03-12 ENCOUNTER — Emergency Department (HOSPITAL_COMMUNITY): Payer: Commercial Managed Care - PPO

## 2023-03-12 ENCOUNTER — Emergency Department (HOSPITAL_COMMUNITY)
Admission: EM | Admit: 2023-03-12 | Discharge: 2023-03-12 | Disposition: A | Discharge status: Home / Self Care | Payer: Commercial Managed Care - PPO | Attending: Emergency Medicine | Admitting: Emergency Medicine

## 2023-03-12 ENCOUNTER — Other Ambulatory Visit: Payer: Self-pay

## 2023-03-12 DIAGNOSIS — K859 Acute pancreatitis without necrosis or infection, unspecified: Secondary | ICD-10-CM | POA: Diagnosis not present

## 2023-03-12 DIAGNOSIS — R1013 Epigastric pain: Secondary | ICD-10-CM | POA: Diagnosis present

## 2023-03-12 DIAGNOSIS — Z7982 Long term (current) use of aspirin: Secondary | ICD-10-CM | POA: Insufficient documentation

## 2023-03-12 DIAGNOSIS — Z7901 Long term (current) use of anticoagulants: Secondary | ICD-10-CM | POA: Insufficient documentation

## 2023-03-12 DIAGNOSIS — R634 Abnormal weight loss: Secondary | ICD-10-CM | POA: Diagnosis not present

## 2023-03-12 LAB — COMPREHENSIVE METABOLIC PANEL
ALT: 22 U/L (ref 0–44)
AST: 21 U/L (ref 15–41)
Albumin: 4.4 g/dL (ref 3.5–5.0)
Alkaline Phosphatase: 45 U/L (ref 38–126)
Anion gap: 13 (ref 5–15)
BUN: 10 mg/dL (ref 6–20)
CO2: 26 mmol/L (ref 22–32)
Calcium: 9.4 mg/dL (ref 8.9–10.3)
Chloride: 102 mmol/L (ref 98–111)
Creatinine, Ser: 0.62 mg/dL (ref 0.61–1.24)
GFR, Estimated: >60 mL/min (ref >60)
Glucose, Bld: 110 mg/dL — ABNORMAL HIGH (ref 70–99)
Potassium: 3.6 mmol/L (ref 3.5–5.1)
Sodium: 141 mmol/L (ref 135–145)
Total Bilirubin: 0.4 mg/dL (ref 0.3–1.2)
Total Protein: 8.2 g/dL — ABNORMAL HIGH (ref 6.5–8.1)

## 2023-03-12 LAB — URINALYSIS, ROUTINE W REFLEX MICROSCOPIC
Bilirubin Urine: NEGATIVE
Glucose, UA: NEGATIVE mg/dL
Hgb urine dipstick: NEGATIVE
Ketones, ur: 5 mg/dL — AB
Leukocytes,Ua: NEGATIVE
Nitrite: NEGATIVE
Protein, ur: NEGATIVE mg/dL
Specific Gravity, Urine: 1.026 (ref 1.005–1.030)
pH: 7 (ref 5.0–8.0)

## 2023-03-12 LAB — PROTIME-INR
INR: 2.3 — ABNORMAL HIGH (ref 0.8–1.2)
Prothrombin Time: 25.4 s — ABNORMAL HIGH (ref 11.4–15.2)

## 2023-03-12 LAB — CBC
HCT: 39.4 % (ref 39.0–52.0)
Hemoglobin: 12.8 g/dL — ABNORMAL LOW (ref 13.0–17.0)
MCH: 30.7 pg (ref 26.0–34.0)
MCHC: 32.5 g/dL (ref 30.0–36.0)
MCV: 94.5 fL (ref 80.0–100.0)
Platelets: 396 10*3/uL (ref 150–400)
RBC: 4.17 MIL/uL — ABNORMAL LOW (ref 4.22–5.81)
RDW: 12.7 % (ref 11.5–15.5)
WBC: 10.1 10*3/uL (ref 4.0–10.5)
nRBC: 0 % (ref 0.0–0.2)

## 2023-03-12 LAB — ETHANOL: Alcohol, Ethyl (B): <10 mg/dL (ref <10)

## 2023-03-12 LAB — TRIGLYCERIDES: Triglycerides: 105 mg/dL (ref <150)

## 2023-03-12 LAB — LIPASE, BLOOD: Lipase: 140 U/L — ABNORMAL HIGH (ref 11–51)

## 2023-03-12 MED ORDER — ONDANSETRON HCL 4 MG PO TABS
4.0000 mg | ORAL_TABLET | Freq: Three times a day (TID) | ORAL | 0 refills | Status: AC | PRN
Start: 2023-03-12 — End: 2023-03-24

## 2023-03-12 MED ORDER — ONDANSETRON 4 MG PO TBDP
4.0000 mg | ORAL_TABLET | Freq: Once | ORAL | Status: AC | PRN
  Administered 2023-03-12: 4 mg via ORAL
  Filled 2023-03-12: qty 1

## 2023-03-12 MED ORDER — IOHEXOL 300 MG/ML  SOLN
100.0000 mL | Freq: Once | INTRAMUSCULAR | Status: AC | PRN
  Administered 2023-03-12: 100 mL via INTRAVENOUS

## 2023-03-12 MED ORDER — ACETAMINOPHEN 325 MG PO TABS: 650.0000 mg | ORAL_TABLET | Freq: Four times a day (QID) | ORAL | 0 refills | Status: DC | PRN

## 2023-03-12 MED ORDER — HYDROMORPHONE HCL 1 MG/ML IJ SOLN
1.0000 mg | Freq: Once | INTRAMUSCULAR | Status: AC
  Administered 2023-03-12: 1 mg via INTRAVENOUS
  Filled 2023-03-12: qty 1

## 2023-03-12 MED ORDER — SODIUM CHLORIDE 0.9 % IV BOLUS
1000.0000 mL | Freq: Once | INTRAVENOUS | Status: AC
  Administered 2023-03-12: 1000 mL via INTRAVENOUS

## 2023-03-12 MED ORDER — OXYCODONE HCL 5 MG PO TABS: 5.0000 mg | ORAL_TABLET | Freq: Four times a day (QID) | ORAL | 0 refills | Status: DC | PRN

## 2023-03-12 MED ORDER — ONDANSETRON HCL 4 MG/2ML IJ SOLN
4.0000 mg | Freq: Once | INTRAMUSCULAR | Status: AC
  Administered 2023-03-12: 4 mg via INTRAVENOUS
  Filled 2023-03-12: qty 2

## 2023-03-12 NOTE — ED Triage Notes (Signed)
Patient reports worsening mid back pain since waking this AM. Patient also reports nausea and vomiting since 1000. Patient recently treated for gastritis.

## 2023-03-12 NOTE — ED Notes (Signed)
Pt gone to CT 

## 2023-03-12 NOTE — Discharge Instructions (Addendum)
You were diagnosed with mild acute pancreatitis today on your CT scan and your blood test.  You have some inflammation that is mild around your pancreas.  This may have been triggered by recent alcohol drinking.  Please avoid any and all alcohol in the future.  There are many other potential causes of pancreatitis.  I would recommend you follow-up with a specialist and a gastroenterologist for this issue.  Do NOT drive a car tonight.  You were given opioid narcotics in the ER.  Please schedule a follow up appointment with your primary care clinic to have your pancreas enzyme levels (lipase) rechecked this week.  You should also call to schedule follow-up appointment with gastroenterology.  You should see a specialist for your recurring pancreatitis, as well as your unexpected weight loss.  You may be having digestive issues or other serious conditions such as certain types of cancer that require a specialist evaluation.

## 2023-03-12 NOTE — ED Provider Notes (Signed)
[place] EMERGENCY DEPARTMENT AT [facility] Provider Note   CSN: [id number] Arrival date & time: 03/12/23  1559     History  Chief Complaint  Patient presents with   Back Pain    [name] [name] is a 32 y.o. male with a history of AVR on Coumadin, presenting to the ED with abdominal pain.  Patient reports 1 week of gradual onset epigastric pain that now is now radiating to his back.  He reports he has had very poor appetite for about a month and has lost 20 pounds unintentionally.  He reports had a regular bowel movement yesterday and is passing gas.  He reports an abdominal surgical history of 2 hernia repairs.  He reports a prior history of recurring pancreatitis that was felt to be likely related to alcohol consumption, although he denies significant binges preceding his episodes.  He reports that he had about 2 beers yesterday.  He denies that he drinks alcohol regularly had any binges preceding this flareup of pain.  He has been treated for gastritis and started on Protonix about a week ago by his PCP.  He does not add any new medications otherwise.  He reports compliance with all of his medicine including his Coumadin.  HPI     Home Medications Prior to Admission medications   Medication Sig Start Date End Date Taking? Authorizing Provider  acetaminophen (TYLENOL) 325 MG tablet Take 2 tablets (650 mg total) by mouth every 6 (six) hours as needed for up to 30 doses for mild pain or moderate pain. 03/12/23  Yes Mirko Tailor, Kermit Balo, MD  ondansetron (ZOFRAN) 4 MG tablet Take 1 tablet (4 mg total) by mouth every 8 (eight) hours as needed for up to 12 days for nausea or vomiting. 03/12/23 03/24/23 Yes Korra Christine, Kermit Balo, MD  oxyCODONE (ROXICODONE) 5 MG immediate release tablet Take 1 tablet (5 mg total) by mouth every 6 (six) hours as needed for up to 15 doses for severe pain. 03/12/23  Yes Terald Sleeper, MD  aspirin EC 81 MG EC tablet Take 1 tablet (81 mg  total) by mouth daily. Swallow whole. 10/02/21   Barrett, Erin R, [name]  loratadine (CLARITIN) 10 MG tablet Take 10 mg by mouth daily.    [provider]  METAMUCIL FIBER PO Take 10 mLs by mouth every other day.    [provider]  metoprolol succinate (TOPROL XL) 25 MG 24 hr tablet Take 0.5 tablets (12.5 mg total) by mouth at bedtime. 11/01/22   Sharlene Dory, [name]  pantoprazole (PROTONIX) 20 MG tablet Take 1 tablet (20 mg total) by mouth daily. Take 1 pill twice a day for 2 weeks, then 1 pill every morning. 03/05/23   [name], NP  warfarin (COUMADIN) 3 MG tablet TAKE 1 AND 1/2 TABLETS BY MOUTH DAILY. EXCEPT TAKE 2 TABLETS ON MONDAYS, WEDNESDAYS AND FRIDAYS OR AS DIRECTED BY COAGULATION CLINIC Patient taking differently: Take 4.5-6 mg by mouth daily. Take 6 mg by mouth on Mondays. Take 4.5 mg by mouth on Tuesday, Wednesday, Thursday, Friday, Saturday, and Sunday. 11/21/22   Orbie Pyo, MD      Allergies    Amoxicillin    Review of Systems   Review of Systems  Physical Exam Updated Vital Signs BP (!) 152/94   Pulse 64   Temp 98.5 F (36.9 C) (Oral)   Resp 18   Ht 5\' 11"  (1.803 m)   Wt 77.1 kg   SpO2  100%   BMI 23.71 kg/m  Physical Exam Constitutional:      General: He is not in acute distress. HENT:     Head: Normocephalic and atraumatic.  Eyes:     Conjunctiva/sclera: Conjunctivae normal.     Pupils: Pupils are equal, round, and reactive to light.  Cardiovascular:     Rate and Rhythm: Normal rate and regular rhythm.  Pulmonary:     Effort: Pulmonary effort is normal. No respiratory distress.  Abdominal:     General: There is no distension.     Tenderness: There is abdominal tenderness. There is no guarding.  Skin:    General: Skin is warm and dry.  Neurological:     General: No focal deficit present.     Mental Status: He is alert. Mental status is at baseline.  Psychiatric:        Mood and Affect: Mood normal.        Behavior: Behavior  normal.     ED Results / Procedures / Treatments   Labs (all labs ordered are listed, but only abnormal results are displayed) Labs Reviewed  LIPASE, BLOOD - Abnormal; Notable for the following components:      Result Value   Lipase 140 (*)    All other components within normal limits  COMPREHENSIVE METABOLIC PANEL - Abnormal; Notable for the following components:   Glucose, Bld 110 (*)    Total Protein 8.2 (*)    All other components within normal limits  CBC - Abnormal; Notable for the following components:   RBC 4.17 (*)    Hemoglobin 12.8 (*)    All other components within normal limits  URINALYSIS, ROUTINE W REFLEX MICROSCOPIC - Abnormal; Notable for the following components:   APPearance CLOUDY (*)    Ketones, ur 5 (*)    All other components within normal limits  PROTIME-INR - Abnormal; Notable for the following components:   Prothrombin Time 25.4 (*)    INR 2.3 (*)    All other components within normal limits  TRIGLYCERIDES  ETHANOL    EKG None  Radiology CT ABDOMEN PELVIS W CONTRAST  Result Date: 03/12/2023 CLINICAL DATA:  Pancreatitis, acute, severe. Mid back pain. Nausea, vomiting EXAM: CT ABDOMEN AND PELVIS WITH CONTRAST TECHNIQUE: Multidetector CT imaging of the abdomen and pelvis was performed using the standard protocol following bolus administration of intravenous contrast. RADIATION DOSE REDUCTION: This exam was performed according to the departmental dose-optimization program which includes automated exposure control, adjustment of the mA and/or kV according to patient size and/or use of iterative reconstruction technique. CONTRAST:  OMNIPAQUE IOHEXOL 300 MG/ML  SOLN COMPARISON:  01/23/2022 FINDINGS: Lower chest: No acute abnormality Hepatobiliary: Diffuse low-density throughout the liver compatible with fatty infiltration. No focal abnormality. Gallbladder unremarkable. Pancreas: Calcifications in the pancreatic head compatible with chronic pancreatitis.  Inflammatory stranding around the pancreas compatible with acute pancreatitis. There is a 2.2 cm cyst in the pancreatic head, likely pseudocyst. Ductal dilatation noted. Spleen: No focal abnormality.  Normal size. Adrenals/Urinary Tract: No adrenal abnormality. No focal renal abnormality. No stones or hydronephrosis. Urinary bladder is unremarkable. Stomach/Bowel: Normal appendix. Stomach, large and small bowel grossly unremarkable. Vascular/Lymphatic: No evidence of aneurysm or adenopathy. Reproductive: No visible focal abnormality. Other: No free fluid or free air.2 Musculoskeletal: No acute bony abnormality. IMPRESSION: Inflammatory changes around the pancreas compatible with acute pancreatitis. 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 Calcifications in the pancreatic head compatible with chronic pancreatitis. Fatty liver.  Electronically Signed   By: Charlett Nose M.D.   On: 03/12/2023 21:01    Procedures Procedures    Medications Ordered in ED Medications  ondansetron (ZOFRAN-ODT) disintegrating tablet 4 mg (4 mg Oral Given 03/12/23 1636)  HYDROmorphone (DILAUDID) injection 1 mg (1 mg Intravenous Given 03/12/23 1909)  sodium chloride 0.9 % bolus 1,000 mL (0 mLs Intravenous Stopped 03/12/23 2159)  ondansetron (ZOFRAN) injection 4 mg (4 mg Intravenous Given 03/12/23 1908)  iohexol (OMNIPAQUE) 300 MG/ML solution 100 mL (100 mLs Intravenous Contrast Given 03/12/23 1923)    ED Course/ Medical Decision Making/ A&P                                 Medical Decision Making Amount and/or Complexity of Data Reviewed Labs: ordered. Radiology: ordered.  Risk OTC drugs. Prescription drug management.   This patient presents to the ED with concern for epigastric abdominal pain, nausea, back pain. This involves an extensive number of treatment options, and is a complaint that carries with it a high risk of complications and morbidity.  The differential diagnosis includes  acute pancreatitis versus gastritis with biliary disease versus other intra-abdominal process  Co-morbidities that complicate the patient evaluation: History of frequent alcohol consumption and high risk of recurring pancreatitis  External records from outside source obtained and reviewed including the abdomen pelvis with contrast in August 2023, during the patient's last hospitalization for pancreatitis, was notable for peripancreatic fat stranding a small amount of free fluid around the pancreas.  Patient's lipase levels over 2000 at that time.  He also had evidence of duodenitis.  I ordered and personally interpreted labs.  The pertinent results include: Lipase 140.  White blood cell count within normal limits.  LFTs within normal limits.  Ethanol & triglyceride levels pending  I ordered imaging studies including CT abdomen pelvis I independently visualized and interpreted imaging which showed acute uncomplicated pancreatitis with pancreatic head cyst I agree with the radiologist interpretation  The patient was maintained on a cardiac monitor.  I personally viewed and interpreted the cardiac monitored which showed an underlying rhythm of: Normal sinus rhythm  I ordered medication including Dilaudid, fluids, Zofran for nausea, abdominal pain.  I have reviewed the patients home medicines and have made adjustments as needed  Doubt AAA, mesenteric ischemia - no indication for CT angiogram imaging at this time.   After the interventions noted above, I reevaluated the patient and found that they have: improved  Social Determinants of Health:discussed importance of complete etoh cessation as possible trigger for pancreatitis  Dispostion:  After consideration of the diagnostic results and the patients response to treatment, I feel that the patent would benefit from outpatient follow-up.  Hospitalization was considered and discussed with the patient.  However, given that his lipase elevation  is quite mild, symptoms overall mild, and is tolerating p.o., with his preference and shared decision making I think it is reasonable to pursue outpatient follow-up.  I will prescribe oral pain medications.  A pancreatic eating plan was discussed.  As well as strongly encouraged to cease all alcohol.  A referral was placed to gastroenterology regarding his recurring pancreatitis and unexpected weight loss.  The patient has a ride coming to pick him up.  He verbalized understanding.         Final Clinical Impression(s) / ED Diagnoses Final diagnoses:  Acute pancreatitis, unspecified complication status, unspecified pancreatitis type  Weight loss  Rx / DC Orders ED Discharge Orders          Ordered    Ambulatory referral to Gastroenterology       Comments: Recurring pancreatitis - unexpected weight loss   03/12/23 2149    oxyCODONE (ROXICODONE) 5 MG immediate release tablet  Every 6 hours PRN        03/12/23 2150    acetaminophen (TYLENOL) 325 MG tablet  Every 6 hours PRN        03/12/23 2150    ondansetron (ZOFRAN) 4 MG tablet  Every 8 hours PRN        03/12/23 2152              Terald Sleeper, MD 03/12/23 2209

## 2023-03-15 ENCOUNTER — Ambulatory Visit: Payer: Commercial Managed Care - PPO | Attending: Cardiology

## 2023-03-15 DIAGNOSIS — Z952 Presence of prosthetic heart valve: Secondary | ICD-10-CM | POA: Diagnosis not present

## 2023-03-15 DIAGNOSIS — Z7901 Long term (current) use of anticoagulants: Secondary | ICD-10-CM

## 2023-03-15 LAB — POCT INR: INR: 4.9 — AB (ref 2.0–3.0)

## 2023-03-15 NOTE — Patient Instructions (Signed)
HOLD TODAY, SATURDAY and SUNDAY THEN continue taking warfarin 4.5mg  daily except for 6mg  on Mondays.  Continue eating 5 serving of greens weekly.  Coumadin Clinic [phone]; INR in 2 weeks (normally 6 weeks).

## 2023-03-18 ENCOUNTER — Encounter: Payer: Self-pay | Admitting: Gastroenterology

## 2023-03-29 ENCOUNTER — Ambulatory Visit: Payer: Commercial Managed Care - PPO | Attending: Cardiovascular Disease

## 2023-03-29 DIAGNOSIS — Z7901 Long term (current) use of anticoagulants: Secondary | ICD-10-CM | POA: Diagnosis not present

## 2023-03-29 DIAGNOSIS — Z952 Presence of prosthetic heart valve: Secondary | ICD-10-CM

## 2023-03-29 LAB — POCT INR: INR: 2.8 (ref 2.0–3.0)

## 2023-03-29 NOTE — Patient Instructions (Signed)
HOLD Today only  THEN continue taking warfarin 4.5mg  daily except for 6mg  on Mondays.  Continue eating 5 serving of greens weekly.  Coumadin Clinic [phone]; INR in 4 weeks (normally 6 weeks).

## 2023-03-30 ENCOUNTER — Other Ambulatory Visit: Payer: Self-pay | Admitting: Internal Medicine

## 2023-03-30 DIAGNOSIS — Z952 Presence of prosthetic heart valve: Secondary | ICD-10-CM

## 2023-04-02 ENCOUNTER — Encounter (HOSPITAL_COMMUNITY): Payer: Self-pay | Admitting: Emergency Medicine

## 2023-04-02 ENCOUNTER — Other Ambulatory Visit: Payer: Self-pay

## 2023-04-02 ENCOUNTER — Observation Stay (HOSPITAL_COMMUNITY)
Admission: EM | Admit: 2023-04-02 | Discharge: 2023-04-03 | Disposition: A | Discharge status: Home / Self Care | Payer: Commercial Managed Care - PPO | Attending: Internal Medicine | Admitting: Internal Medicine

## 2023-04-02 DIAGNOSIS — I10 Essential (primary) hypertension: Secondary | ICD-10-CM | POA: Diagnosis present

## 2023-04-02 DIAGNOSIS — Z7901 Long term (current) use of anticoagulants: Secondary | ICD-10-CM | POA: Insufficient documentation

## 2023-04-02 DIAGNOSIS — Z7982 Long term (current) use of aspirin: Secondary | ICD-10-CM | POA: Insufficient documentation

## 2023-04-02 DIAGNOSIS — K219 Gastro-esophageal reflux disease without esophagitis: Secondary | ICD-10-CM | POA: Diagnosis not present

## 2023-04-02 DIAGNOSIS — K863 Pseudocyst of pancreas: Secondary | ICD-10-CM | POA: Diagnosis not present

## 2023-04-02 DIAGNOSIS — I7121 Aneurysm of the ascending aorta, without rupture: Secondary | ICD-10-CM | POA: Diagnosis not present

## 2023-04-02 DIAGNOSIS — Z952 Presence of prosthetic heart valve: Secondary | ICD-10-CM | POA: Diagnosis not present

## 2023-04-02 DIAGNOSIS — K859 Acute pancreatitis without necrosis or infection, unspecified: Secondary | ICD-10-CM | POA: Diagnosis not present

## 2023-04-02 DIAGNOSIS — R10816 Epigastric abdominal tenderness: Secondary | ICD-10-CM | POA: Diagnosis present

## 2023-04-02 DIAGNOSIS — K861 Other chronic pancreatitis: Secondary | ICD-10-CM | POA: Diagnosis present

## 2023-04-02 DIAGNOSIS — J45909 Unspecified asthma, uncomplicated: Secondary | ICD-10-CM | POA: Insufficient documentation

## 2023-04-02 LAB — URINALYSIS, ROUTINE W REFLEX MICROSCOPIC
Glucose, UA: NEGATIVE mg/dL
Hgb urine dipstick: NEGATIVE
Ketones, ur: 80 mg/dL — AB
Leukocytes,Ua: NEGATIVE
Nitrite: NEGATIVE
Protein, ur: 100 mg/dL — AB
Specific Gravity, Urine: >1.046 — ABNORMAL HIGH (ref 1.005–1.030)
pH: 5 (ref 5.0–8.0)

## 2023-04-02 LAB — COMPREHENSIVE METABOLIC PANEL
ALT: 39 U/L (ref 0–44)
AST: 46 U/L — ABNORMAL HIGH (ref 15–41)
Albumin: 4.9 g/dL (ref 3.5–5.0)
Alkaline Phosphatase: 201 U/L — ABNORMAL HIGH (ref 38–126)
Anion gap: 16 — ABNORMAL HIGH (ref 5–15)
BUN: 11 mg/dL (ref 6–20)
CO2: 22 mmol/L (ref 22–32)
Calcium: 9.7 mg/dL (ref 8.9–10.3)
Chloride: 99 mmol/L (ref 98–111)
Creatinine, Ser: 0.63 mg/dL (ref 0.61–1.24)
GFR, Estimated: >60 mL/min (ref >60)
Glucose, Bld: 111 mg/dL — ABNORMAL HIGH (ref 70–99)
Potassium: 3.5 mmol/L (ref 3.5–5.1)
Sodium: 137 mmol/L (ref 135–145)
Total Bilirubin: 1 mg/dL (ref 0.3–1.2)
Total Protein: 8.9 g/dL — ABNORMAL HIGH (ref 6.5–8.1)

## 2023-04-02 LAB — CBC WITH DIFFERENTIAL/PLATELET
Abs Immature Granulocytes: 0.01 10*3/uL (ref 0.00–0.07)
Basophils Absolute: 0 10*3/uL (ref 0.0–0.1)
Basophils Relative: 1 %
Eosinophils Absolute: 0.7 10*3/uL — ABNORMAL HIGH (ref 0.0–0.5)
Eosinophils Relative: 8 %
HCT: 41.3 % (ref 39.0–52.0)
Hemoglobin: 13.7 g/dL (ref 13.0–17.0)
Immature Granulocytes: 0 %
Lymphocytes Relative: 17 %
Lymphs Abs: 1.4 10*3/uL (ref 0.7–4.0)
MCH: 30.6 pg (ref 26.0–34.0)
MCHC: 33.2 g/dL (ref 30.0–36.0)
MCV: 92.2 fL (ref 80.0–100.0)
Monocytes Absolute: 0.7 10*3/uL (ref 0.1–1.0)
Monocytes Relative: 9 %
Neutro Abs: 5.4 10*3/uL (ref 1.7–7.7)
Neutrophils Relative %: 65 %
Platelets: 400 10*3/uL (ref 150–400)
RBC: 4.48 MIL/uL (ref 4.22–5.81)
RDW: 13.1 % (ref 11.5–15.5)
WBC: 8.3 10*3/uL (ref 4.0–10.5)
nRBC: 0 % (ref 0.0–0.2)

## 2023-04-02 LAB — LIPASE, BLOOD: Lipase: 453 U/L — ABNORMAL HIGH (ref 11–51)

## 2023-04-02 LAB — PROTIME-INR
INR: 2.5 — ABNORMAL HIGH (ref 0.8–1.2)
Prothrombin Time: 27.3 s — ABNORMAL HIGH (ref 11.4–15.2)

## 2023-04-02 MED ORDER — ACETAMINOPHEN 650 MG RE SUPP: 650.0000 mg | Freq: Four times a day (QID) | RECTAL | Status: DC | PRN

## 2023-04-02 MED ORDER — LACTATED RINGERS IV BOLUS
1000.0000 mL | Freq: Once | INTRAVENOUS | Status: AC
  Administered 2023-04-02: 1000 mL via INTRAVENOUS

## 2023-04-02 MED ORDER — KCL IN DEXTROSE-NACL 20-5-0.2 MEQ/L-%-% IV SOLN
INTRAVENOUS | Status: DC
  Filled 2023-04-02 (×3): qty 1000

## 2023-04-02 MED ORDER — HYDROMORPHONE HCL 1 MG/ML IJ SOLN
1.0000 mg | INTRAMUSCULAR | Status: DC | PRN
  Administered 2023-04-03: 1 mg via INTRAVENOUS
  Filled 2023-04-02: qty 1

## 2023-04-02 MED ORDER — OXYCODONE-ACETAMINOPHEN 5-325 MG PO TABS
1.0000 | ORAL_TABLET | Freq: Once | ORAL | Status: AC
  Administered 2023-04-02: 1 via ORAL
  Filled 2023-04-02: qty 1

## 2023-04-02 MED ORDER — MORPHINE SULFATE (PF) 4 MG/ML IV SOLN
4.0000 mg | Freq: Once | INTRAVENOUS | Status: AC
  Administered 2023-04-02: 4 mg via INTRAVENOUS
  Filled 2023-04-02: qty 1

## 2023-04-02 MED ORDER — PANTOPRAZOLE SODIUM 40 MG IV SOLR
40.0000 mg | Freq: Once | INTRAVENOUS | Status: AC
  Administered 2023-04-02: 40 mg via INTRAVENOUS
  Filled 2023-04-02: qty 10

## 2023-04-02 MED ORDER — OXYCODONE HCL 5 MG PO TABS
5.0000 mg | ORAL_TABLET | Freq: Four times a day (QID) | ORAL | Status: DC | PRN
  Administered 2023-04-02: 5 mg via ORAL
  Filled 2023-04-02: qty 1

## 2023-04-02 MED ORDER — METOPROLOL SUCCINATE ER 25 MG PO TB24
12.5000 mg | ORAL_TABLET | Freq: Every day | ORAL | Status: DC
  Administered 2023-04-02: 12.5 mg via ORAL
  Filled 2023-04-02: qty 1

## 2023-04-02 MED ORDER — ENOXAPARIN SODIUM 40 MG/0.4ML IJ SOSY: 40.0000 mg | PREFILLED_SYRINGE | INTRAMUSCULAR | Status: DC

## 2023-04-02 MED ORDER — ONDANSETRON HCL 4 MG PO TABS: 4.0000 mg | ORAL_TABLET | Freq: Four times a day (QID) | ORAL | Status: DC | PRN

## 2023-04-02 MED ORDER — ACETAMINOPHEN 325 MG PO TABS
650.0000 mg | ORAL_TABLET | Freq: Four times a day (QID) | ORAL | Status: DC | PRN
  Administered 2023-04-03 (×2): 650 mg via ORAL
  Filled 2023-04-02 (×2): qty 2

## 2023-04-02 MED ORDER — ONDANSETRON HCL 4 MG/2ML IJ SOLN: 4.0000 mg | Freq: Four times a day (QID) | INTRAMUSCULAR | Status: DC | PRN

## 2023-04-02 MED ORDER — ONDANSETRON HCL 4 MG/2ML IJ SOLN
4.0000 mg | Freq: Once | INTRAMUSCULAR | Status: AC
  Administered 2023-04-02: 4 mg via INTRAVENOUS
  Filled 2023-04-02: qty 2

## 2023-04-02 NOTE — ED Provider Triage Note (Signed)
Emergency Medicine Provider Triage Evaluation Note  [name] [name] , a 32 y.o. male  was evaluated in triage.  Pt complains of epigastric abdominal pain. Feels like his pancreatitis. Started 6 days ago. Pain is severe and constant. Unable to eat or sleep due to pain. Nausea without vomiting or diarrhea. No alcohol intake in the last month. No fevers or chills  Review of Systems  Positive:  Negative:   Physical Exam  BP (!) 170/100   Pulse 68   Temp 97.6 F (36.4 C) (Oral)   Resp 18   Ht 5\' 11"  (1.803 m)   Wt 77.1 kg   SpO2 99%   BMI 23.71 kg/m  Gen:   Awake, no distress   Resp:  Normal effort  MSK:   Moves extremities without difficulty  Other:    Medical Decision Making  Medically screening exam initiated at 7:29 AM.  Appropriate orders placed.  [name] [name] was informed that the remainder of the evaluation will be completed by another provider, this initial triage assessment does not replace that evaluation, and the importance of remaining in the ED until their evaluation is complete.     Silva Bandy, [name] 04/02/23 [phone]

## 2023-04-02 NOTE — ED Provider Notes (Signed)
[place] EMERGENCY DEPARTMENT AT [facility] Provider Note   CSN: [id number] Arrival date & time: 04/02/23  2841     History  Chief Complaint  Patient presents with   Abdominal Pain    [name] [name] is a 32 y.o. male.  Patient with history of pancreatitis, AVR on Coumadin presents today with complaints of abdominal pain. He states that same began initially 6 days ago and has been persistent since. Pain is epigastric in nature and feels like his previous flares of pancreatitis. He tried to get in to see his GI doctor but they do not have an opening until December. He notes that he used to get flares all the time, however he has significantly cut back on his alcohol consumption with improvement in the frequency of his flares. He last drank alcohol at the end of September. He last had a flare at the beginning of October. Notes that he is having nausea without vomiting or diarrhea.  He is having regular bowel movements.  States that his pain is severe and worse with eating and therefore he has had minimal oral intake in the last 6 days.  Denies fevers or chills.  No chest pain or shortness of breath.   The history is provided by the patient. No language interpreter was used.  Abdominal Pain      Home Medications Prior to Admission medications   Medication Sig Start Date End Date Taking? Authorizing Provider  acetaminophen (TYLENOL) 325 MG tablet Take 2 tablets (650 mg total) by mouth every 6 (six) hours as needed for up to 30 doses for mild pain or moderate pain. 03/12/23   Terald Sleeper, MD  aspirin EC 81 MG EC tablet Take 1 tablet (81 mg total) by mouth daily. Swallow whole. 10/02/21   Barrett, Erin R, [name]  loratadine (CLARITIN) 10 MG tablet Take 10 mg by mouth daily.    [provider]  METAMUCIL FIBER PO Take 10 mLs by mouth every other day.    [provider]  metoprolol succinate (TOPROL XL) 25 MG 24 hr tablet Take 0.5 tablets  (12.5 mg total) by mouth at bedtime. 11/01/22   Sharlene Dory, [name]  oxyCODONE (ROXICODONE) 5 MG immediate release tablet Take 1 tablet (5 mg total) by mouth every 6 (six) hours as needed for up to 15 doses for severe pain. 03/12/23   Terald Sleeper, MD  pantoprazole (PROTONIX) 20 MG tablet Take 1 tablet (20 mg total) by mouth daily. Take 1 pill twice a day for 2 weeks, then 1 pill every morning. 03/05/23   [name], NP  warfarin (COUMADIN) 3 MG tablet TAKE 1 AND 1/2 TABLETS BY MOUTH DAILY. EXCEPT TAKE 2 TABLETS ON MONDAYS, WEDNESDAYS AND FRIDAYS OR AS DIRECTED BY COAGULATION CLINIC 04/01/23   Orbie Pyo, MD      Allergies    Amoxicillin    Review of Systems   Review of Systems  Gastrointestinal:  Positive for abdominal pain.  All other systems reviewed and are negative.   Physical Exam Updated Vital Signs BP (!) 170/100   Pulse 68   Temp (!) 97.4 F (36.3 C) (Oral)   Resp 18   Ht 5\' 11"  (1.803 m)   Wt 77.1 kg   SpO2 99%   BMI 23.71 kg/m  Physical Exam Vitals and nursing note reviewed.  Constitutional:      General: He is not in acute distress.    Appearance: Normal appearance. He  is normal weight. He is not ill-appearing, toxic-appearing or diaphoretic.  HENT:     Head: Normocephalic and atraumatic.  Cardiovascular:     Rate and Rhythm: Normal rate.  Pulmonary:     Effort: Pulmonary effort is normal. No respiratory distress.  Abdominal:     General: Abdomen is flat.     Palpations: Abdomen is soft.     Tenderness: There is abdominal tenderness in the epigastric area.  Musculoskeletal:        General: Normal range of motion.     Cervical back: Normal range of motion.  Skin:    General: Skin is warm and dry.  Neurological:     General: No focal deficit present.     Mental Status: He is alert.  Psychiatric:        Mood and Affect: Mood normal.        Behavior: Behavior normal.     ED Results / Procedures / Treatments   Labs (all labs ordered  are listed, but only abnormal results are displayed) Labs Reviewed  COMPREHENSIVE METABOLIC PANEL - Abnormal; Notable for the following components:      Result Value   Glucose, Bld 111 (*)    Total Protein 8.9 (*)    AST 46 (*)    Alkaline Phosphatase 201 (*)    Anion gap 16 (*)    All other components within normal limits  LIPASE, BLOOD - Abnormal; Notable for the following components:   Lipase 453 (*)    All other components within normal limits  CBC WITH DIFFERENTIAL/PLATELET - Abnormal; Notable for the following components:   Eosinophils Absolute 0.7 (*)    All other components within normal limits  URINALYSIS, ROUTINE W REFLEX MICROSCOPIC - Abnormal; Notable for the following components:   Color, Urine AMBER (*)    Specific Gravity, Urine >1.046 (*)    Bilirubin Urine SMALL (*)    Ketones, ur 80 (*)    Protein, ur 100 (*)    Bacteria, UA RARE (*)    All other components within normal limits    EKG None  Radiology No results found.  Procedures Procedures    Medications Ordered in ED Medications  oxyCODONE-acetaminophen (PERCOCET/ROXICET) 5-325 MG per tablet 1 tablet (1 tablet Oral Given 04/02/23 0740)  morphine (PF) 4 MG/ML injection 4 mg (4 mg Intravenous Given 04/02/23 1321)  ondansetron (ZOFRAN) injection 4 mg (4 mg Intravenous Given 04/02/23 1320)  lactated ringers bolus 1,000 mL (1,000 mLs Intravenous New Bag/Given 04/02/23 1321)    ED Course/ Medical Decision Making/ A&P                                 Medical Decision Making Amount and/or Complexity of Data Reviewed Labs: ordered.  Risk Prescription drug management.   This patient is a 32 y.o. male who presents to the ED for concern of abdominal pain, this involves an extensive number of treatment options, and is a complaint that carries with it a high risk of complications and morbidity. The emergent differential diagnosis prior to evaluation includes, but is not limited to,  PUD, GERD, gastritis,  pancreatitis, gastroparesis, malignancy, biliary disease, ACS, pericarditis, pneumonia, intestinal ischemia, esophageal rupture, hepatitis  This is not an exhaustive differential.   Past Medical History / Co-morbidities / Social History:  has a past medical history of Asthma, Headache, Heart murmur, Hypokalemia (06/08/2020), Leukocytosis (01/23/2022), Mild aortic stenosis, Pancreatitis, Pneumonia, and Substance  abuse (HCC).  Additional history: Chart reviewed. Pertinent results include: seen on 03/12/23 for similar symptoms, found to have acute pancreatitis.  Was discharged home with pain medication and referral to GI.  Physical Exam: Physical exam performed. The pertinent findings include: epigastric abdominal pain  Lab Tests: I ordered, and personally interpreted labs.  The pertinent results include:  glucose 111, alk phos 201. UA with ketones. Lipase 453   Medications: I ordered medication including morphine, zofran, fluids  for pain. Reevaluation of the patient after these medicines showed that the patient improved. I have reviewed the patients home medicines and have made adjustments as needed.   Disposition: After consideration of the diagnostic results and the patients response to treatment, I feel that patient will require admission for acute pancreatitis. His pain is not well controlled with home meds, has not been tolerating po. He is requesting admission.  Discussed patient with hospitalist Dr. Jeannette How who accepts patient for admission   Findings and plan of care discussed with supervising physician Dr. Freida Busman who is in agreement.  Final Clinical Impression(s) / ED Diagnoses Final diagnoses:  Acute pancreatitis, unspecified complication status, unspecified pancreatitis type    Rx / DC Orders ED Discharge Orders     None         Vear Clock 04/02/23 1411    Lorre Nick, MD 04/03/23 [phone]

## 2023-04-02 NOTE — Progress Notes (Signed)
PHARMACY - ANTICOAGULATION CONSULT NOTE  Pharmacy Consult for warfarin Indication: mechanical AVR  Allergies  Allergen Reactions   Amoxicillin Hives   Patient Measurements: Height: 5\' 11"  (180.3 cm) Weight: 77.1 kg (170 lb) IBW/kg (Calculated) : 75.3  Vital Signs: Temp: 98.2 F (36.8 C) (10/22 1825) Temp Source: Oral (10/22 1825) BP: 157/104 (10/22 1825) Pulse Rate: 77 (10/22 1825)  Labs: Recent Labs    04/02/23 0741 04/02/23 1757  HGB 13.7  --   HCT 41.3  --   PLT 400  --   LABPROT  --  27.3*  INR  --  2.5*  CREATININE 0.63  --     Estimated Creatinine Clearance: 141.2 mL/min (by C-G formula based on SCr of 0.63 mg/dL).   Medical History: Past Medical History:  Diagnosis Date   Asthma    Headache    migraines   Heart murmur    Hypokalemia 06/08/2020   Leukocytosis 01/23/2022   Mild aortic stenosis    Pancreatitis    Pneumonia    as a child x several   Substance abuse (HCC)     Medications:  Medications Prior to Admission  Medication Sig Dispense Refill Last Dose   acetaminophen (TYLENOL) 325 MG tablet Take 2 tablets (650 mg total) by mouth every 6 (six) hours as needed for up to 30 doses for mild pain or moderate pain. 30 tablet 0 04/02/2023   aspirin EC 81 MG EC tablet Take 1 tablet (81 mg total) by mouth daily. Swallow whole. 30 tablet 11 04/02/2023   loratadine (CLARITIN) 10 MG tablet Take 10 mg by mouth daily.   04/02/2023   METAMUCIL FIBER PO Take 10 mLs by mouth every other day.   Past Month   metoprolol succinate (TOPROL XL) 25 MG 24 hr tablet Take 0.5 tablets (12.5 mg total) by mouth at bedtime. 45 tablet 3 04/01/2023   pantoprazole (PROTONIX) 20 MG tablet Take 1 tablet (20 mg total) by mouth daily. Take 1 pill twice a day for 2 weeks, then 1 pill every morning. 45 tablet 1    warfarin (COUMADIN) 3 MG tablet TAKE 1 AND 1/2 TABLETS BY MOUTH DAILY. EXCEPT TAKE 2 TABLETS ON MONDAYS, WEDNESDAYS AND FRIDAYS OR AS DIRECTED BY COAGULATION CLINIC  (Patient taking differently: Take 3 mg by mouth See admin instructions. TAKE 1 AND 1/2 TABLETS BY MOUTH DAILY. EXCEPT TAKE 2 TABLETS ON MONDAYS, WEDNESDAYS AND FRIDAYS OR AS DIRECTED BY COAGULATION CLINIC) 45 tablet 3 04/01/2023 at 1600   oxyCODONE (ROXICODONE) 5 MG immediate release tablet Take 1 tablet (5 mg total) by mouth every 6 (six) hours as needed for up to 15 doses for severe pain. 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 Pharmacy to continue warfarin while admitted.  OF NOTE: patient underwent replacement of aortic valve in April 2023 using a newer generation ON-X valve. Following results of the PROACT trial, the valve received FDA approval with an INR goal of 1.5-2.0 when used in the aortic position.  Baseline INR elevated (above goal range) Prior anticoagulation: warfarin 4.5 mg daily, except 6 mg on Mondays (LD 10/21 - presumably 6 mg)  Significant events:  Today, 04/02/2023: CBC: WNL INR supratherapeutic (suspect d/t reduced PO intake) Major  drug interactions: none noted; patient chronically on ASA 81 mg/day No bleeding issues per nursing PO intake limited d/t acute pancreatitis Currently ordered prophylactic-dose lovenox  Goal of Therapy: INR 2-3  Plan: Hold warfarin tonight Discontinue LMWH ppx Daily INR CBC at least q72 hr while on warfarin Monitor for signs of bleeding or thrombosis   Bernadene Person, PharmD, BCPS [phone] 04/02/2023, 9:40 PM

## 2023-04-02 NOTE — ED Triage Notes (Signed)
Patient arrives ambulatory by POV c/o pancreatitis flare up worsening since thursday. States he has a GI appt in December. Reports unable to sleep or eat due to pain.

## 2023-04-02 NOTE — H&P (Signed)
History and Physical    Patient: [name] [name] HQI:[id number] DOB: 1991/05/12 DOA: 04/02/2023 DOS: the patient was seen and examined on 04/02/2023 PCP: [name], NP  Patient coming from: Home  Chief Complaint:  Chief Complaint  Patient presents with   Abdominal Pain   HPI: [name] [name] is a 32 y.o. 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  Symptoms are typical of his pancreatitis pain. No emesis, diarrhea, melena or hematochezia.  He has been constipated for the last few days.  No flank pain, dysuria, frequency or hematuria.He denied fever, chills, rhinorrhea, sore throat, wheezing or hemoptysis.  No chest pain, palpitations, diaphoresis, PND, orthopnea or pitting edema of the lower extremities.    No polyuria, polydipsia, polyphagia or blurred vision.   Lab work: Urinalysis showed a specific gravity greater than 1.046, small bilirubin, ketones of 80 and protein of 100 mg/deciliter.  There was rare bacteria microscopic examination.  CBC was normal.  CMP showed normal electrolytes and renal function with a mildly elevated anion gap at 16.  Glucose 111 mg/dL, total protein 8.9 g/dL, AST 46 and alkaline phosphatase 201 units/L.  The rest of the LFTs were normal.  Imaging: CT abdomen/pelvis with contrast done 3 weeks ago showed acute pancreatitis associated with a 2.2 cm pseudocyst in the pancreatic head with ductal dilatation.  There were calcifications in the head of the pancreas compatible with chronic pancreatitis.  Fatty liver.   ED course: Initial vital signs were temperature 97.6 F, pulse 68, respiration 18, BP 170/100  mmHg, 99% on room air.  The patient received LR 1000 mm bolus, morphine 4 mg IVP, ondansetron 4 mg IVP and oxycodone/APAP 5/325 tablets p.o. x 1.  Review of Systems: As mentioned in the history of present illness. All other systems reviewed and are negative. Past Medical History:  Diagnosis Date   Asthma    Headache    migraines   Heart murmur    Hypokalemia 06/08/2020   Leukocytosis 01/23/2022   Mild aortic stenosis    Pancreatitis    Pneumonia    as a child x several   Substance abuse [facility])    Past Surgical History:  Procedure Laterality Date   AORTIC VALVE REPLACEMENT N/A 09/27/2021   Procedure: AORTIC VALVE REPLACEMENT (AVR) USING ON-X AORTIC VALVE SIZE ;  Surgeon: Loreli Slot, MD;  Location: [facility] OR;  Service: Open Heart Surgery;  Laterality: N/A;   INGUINAL HERNIA REPAIR Right 01/05/2022   Procedure: OPEN REPAIR INCARCERATED RIGHT INGUINAL HERNIA WITH MESH;  Surgeon: Gaynelle Adu, MD;  Location: [facility] ORS;  Service: General;  Laterality: Right;  90 MIN - ROOM 2   INGUINAL HERNIA REPAIR Left 12/20/2022   Procedure: OPEN REPAIR LEFT INGUINAL HERNIA WITH MESH;  Surgeon: Gaynelle Adu, MD;  Location: [facility] ORS;  Service: General;  Laterality: Left;   REPLACEMENT ASCENDING AORTA N/A 09/27/2021   Procedure: REPLACEMENT ASCENDING AND INNOMINATE ANEURYSM USING HEMASHIELD PLATINUM [id number];  Surgeon: Loreli Slot, MD;  Location: [facility] OR;  Service: Open Heart Surgery;  Laterality: N/A;   RIGHT HEART CATH AND CORONARY ANGIOGRAPHY N/A 09/07/2021   Procedure: RIGHT HEART CATH AND CORONARY ANGIOGRAPHY;  Surgeon: Orbie Pyo, MD;  Location: [facility] INVASIVE CV LAB;  Service: Cardiovascular;  Laterality: N/A;   TEE WITHOUT CARDIOVERSION N/A 09/27/2021   Procedure: TRANSESOPHAGEAL ECHOCARDIOGRAM (TEE);  Surgeon: Loreli Slot, MD;  Location: [facility] OR;  Service: Open Heart Surgery;  Laterality: N/A;   Social History:  reports that he has never smoked. He has never used smokeless  tobacco. He reports current alcohol use. He reports that he does not currently use drugs after having used the following drugs: Other-see comments.  Allergies  Allergen Reactions   Amoxicillin Hives    Family History  Problem Relation Age of Onset   Pancreatitis Maternal Uncle     Prior to Admission medications   Medication Sig Start Date End Date Taking? Authorizing Provider  acetaminophen (TYLENOL) 325 MG tablet Take 2 tablets (650 mg total) by mouth every 6 (six) hours as needed for up to 30 doses for mild pain or moderate pain. 03/12/23   Terald Sleeper, MD  aspirin EC 81 MG EC tablet Take 1 tablet (81 mg total) by mouth daily. Swallow whole. 10/02/21   Barrett, Erin R, [name]  loratadine (CLARITIN) 10 MG tablet Take 10 mg by mouth daily.    [provider]  METAMUCIL FIBER PO Take 10 mLs by mouth every other day.    [provider]  metoprolol succinate (TOPROL XL) 25 MG 24 hr tablet Take 0.5 tablets (12.5 mg total) by mouth at bedtime. 11/01/22   Sharlene Dory, [name]  oxyCODONE (ROXICODONE) 5 MG immediate release tablet Take 1 tablet (5 mg total) by mouth every 6 (six) hours as needed for up to 15 doses for severe pain. 03/12/23   Terald Sleeper, MD  pantoprazole (PROTONIX) 20 MG tablet Take 1 tablet (20 mg total) by mouth daily. Take 1 pill twice a day for 2 weeks, then 1 pill every morning. 03/05/23   [name], NP  warfarin (COUMADIN) 3 MG tablet TAKE 1 AND 1/2 TABLETS BY MOUTH DAILY. EXCEPT TAKE 2 TABLETS ON MONDAYS, WEDNESDAYS AND FRIDAYS OR AS DIRECTED BY COAGULATION CLINIC 04/01/23   Orbie Pyo, MD    Physical Exam: Vitals:   04/02/23 0700 04/02/23 0709 04/02/23 1218 04/02/23 1330  BP:  (!) 170/100  (!) 156/103  Pulse:  68  80  Resp: 18     Temp: 97.6 F (36.4 C)  (!) 97.4 F (36.3 C)   TempSrc: Oral  Oral   SpO2:  99%  100%  Weight: 77.1 kg     Height: 5\' 11"  (1.803 m)      Physical Exam Vitals and nursing note reviewed.   Constitutional:      General: He is awake. He is not in acute distress.    Appearance: He is well-developed and normal weight.  HENT:     Head: Normocephalic.     Nose: No rhinorrhea.     Mouth/Throat:     Mouth: Mucous membranes are moist.  Eyes:     General: No scleral icterus.    Pupils: Pupils are equal, round, and reactive to light.  Neck:     Vascular: No JVD.  Cardiovascular:     Rate and Rhythm: Normal rate and regular rhythm.     Heart sounds: S1 normal and S2 normal.  Pulmonary:     Effort: Pulmonary effort is normal.     Breath sounds: Normal breath sounds.  Abdominal:     General: Bowel sounds are normal.     Palpations: Abdomen is soft.     Tenderness: There is abdominal tenderness in the  epigastric area. There is no right CVA tenderness, left CVA tenderness, guarding or rebound.  Musculoskeletal:     Cervical back: Neck supple.     Right lower leg: No edema.     Left lower leg: No edema.  Skin:    General: Skin is warm and dry.  Neurological:     General: No focal deficit present.     Mental Status: He is alert and oriented to person, place, and time.  Psychiatric:        Mood and Affect: Mood normal.        Behavior: Behavior normal. Behavior is cooperative.     Data Reviewed:  Results are pending, will review when available.  Assessment and Plan: Principal Problem:   Acute pancreatitis Observation/MedSurg. Continue IV fluids. Keep n.p.o. for now. Analgesics as needed. Antiemetics as needed. Pantoprazole 40 mg IVP daily. Follow CBC, CMP and lipase in AM.  Active Problems:   S/P AVR (aortic valve replacement) and aortoplasty Continue warfarin for pharmacy per Continue aspirin 81 mg p.o. daily. Follow-up with cardiology as an outpatient.    Essential hypertension Pain control. Continue metoprolol 12.5 mg p.o. bedtime.    Gastroesophageal reflux disease without esophagitis On PPI.     Advance Care Planning:   Code Status: Full Code    Consults:   Family Communication:   Severity of Illness: The appropriate patient status for this patient is OBSERVATION. Observation status is judged to be reasonable and necessary in order to provide the required intensity of service to ensure the patient's safety. The patient's presenting symptoms, physical exam findings, and initial radiographic and laboratory data in the context of their medical condition is felt to place them at decreased risk for further clinical deterioration. Furthermore, it is anticipated that the patient will be medically stable for discharge from the hospital within 2 midnights of admission.   Author: Bobette Mo, MD 04/02/2023 2:11 PM  For on call review [email or web address].   This document was prepared using Dragon voice recognition software and may contain some unintended transcription errors.

## 2023-04-02 NOTE — ED Notes (Signed)
ED TO INPATIENT HANDOFF REPORT  Name/Age/Gender [name] [name] Rexford Lascola 32 y.o. male  Code Status    Code Status Orders  (From admission, onward)           Start     Ordered   04/02/23 1417  Full code  Continuous       Question:  By:  Answer:  Consent: discussion documented in EHR   04/02/23 1419           Code Status History     Date Active Date Inactive Code Status Order ID Comments User Context   01/23/2022 0747 01/25/2022 2005 Full Code [id number]  Clydie Braun, MD ED   09/27/2021 1449 10/02/2021 1429 Full Code [id number]  Rowe Clack, [name] Inpatient   09/07/2021 0924 09/07/2021 1701 Full Code [id number]  Orbie Pyo, MD Inpatient   06/06/2020 2139 06/09/2020 2136 Full Code [id number]  Charlsie Quest, MD ED       Home/SNF/Other Home  Chief Complaint Acute pancreatitis [K85.90]  Level of Care/Admitting Diagnosis ED Disposition     ED Disposition  Admit   Condition  --   Comment  Hospital Area: [facility] [[id number]]  Level of Care: Med-Surg [16]  May place patient in observation at [facility] or [facility] if equivalent level of care is available:: No  Covid Evaluation: Asymptomatic - no recent exposure (last 10 days) testing not required  Diagnosis: Acute pancreatitis [577.0.ICD-9-CM]  Admitting Physician: Bobette Mo [[id number]]  Attending Physician: Bobette Mo [[id number]]          Medical History Past Medical History:  Diagnosis Date   Asthma    Headache    migraines   Heart murmur    Hypokalemia 06/08/2020   Leukocytosis 01/23/2022   Mild aortic stenosis    Pancreatitis    Pneumonia    as a child x several   Substance abuse (HCC)     Allergies Allergies  Allergen Reactions   Amoxicillin Hives    IV Location/Drains/Wounds Patient Lines/Drains/Airways Status     Active Line/Drains/Airways     Name Placement date Placement time Site Days   Peripheral IV 04/02/23 20 G 1"  Anterior;Distal;Right;Upper Arm 04/02/23  1224  Arm  less than 1            Labs/Imaging Results for orders placed or performed during the hospital encounter of 04/02/23 (from the past 48 hour(s))  Comprehensive metabolic panel     Status: Abnormal   Collection Time: 04/02/23  7:41 AM  Result Value Ref Range   Sodium 137 135 - 145 mmol/L   Potassium 3.5 3.5 - 5.1 mmol/L   Chloride 99 98 - 111 mmol/L   CO2 22 22 - 32 mmol/L   Glucose, Bld 111 (H) 70 - 99 mg/dL    Comment: Glucose reference range applies only to samples taken after fasting for at least 8 hours.   BUN 11 6 - 20 mg/dL   Creatinine, Ser 6.44 0.61 - 1.24 mg/dL   Calcium 9.7 8.9 - 03.4 mg/dL   Total Protein 8.9 (H) 6.5 - 8.1 g/dL   Albumin 4.9 3.5 - 5.0 g/dL   AST 46 (H) 15 - 41 U/L   ALT 39 0 - 44 U/L   Alkaline Phosphatase 201 (H) 38 - 126 U/L   Total Bilirubin 1.0 0.3 - 1.2 mg/dL   GFR, Estimated >74 >25 mL/min    Comment: (NOTE) Calculated using the CKD-EPI Creatinine Equation (  2021)    Anion gap 16 (H) 5 - 15    Comment: Performed at [facility], [address], [place], [place] [address]  Lipase, blood     Status: Abnormal   Collection Time: 04/02/23  7:41 AM  Result Value Ref Range   Lipase 453 (H) 11 - 51 U/L    Comment: RESULT CONFIRMED BY MANUAL DILUTION Performed at [facility], [address], [place], [place] [address]   CBC with Diff     Status: Abnormal   Collection Time: 04/02/23  7:41 AM  Result Value Ref Range   WBC 8.3 4.0 - 10.5 K/uL   RBC 4.48 4.22 - 5.81 MIL/uL   Hemoglobin 13.7 13.0 - 17.0 g/dL   HCT 57.8 46.9 - 62.9 %   MCV 92.2 80.0 - 100.0 fL   MCH 30.6 26.0 - 34.0 pg   MCHC 33.2 30.0 - 36.0 g/dL   RDW 52.8 41.3 - 24.4 %   Platelets 400 150 - 400 K/uL   nRBC 0.0 0.0 - 0.2 %   Neutrophils Relative % 65 %   Neutro Abs 5.4 1.7 - 7.7 K/uL   Lymphocytes Relative 17 %   Lymphs Abs 1.4 0.7 - 4.0 K/uL   Monocytes Relative 9 %   Monocytes  Absolute 0.7 0.1 - 1.0 K/uL   Eosinophils Relative 8 %   Eosinophils Absolute 0.7 (H) 0.0 - 0.5 K/uL   Basophils Relative 1 %   Basophils Absolute 0.0 0.0 - 0.1 K/uL   Immature Granulocytes 0 %   Abs Immature Granulocytes 0.01 0.00 - 0.07 K/uL    Comment: Performed at [facility], [address] [place], [place] [address]  Urinalysis, Routine w reflex microscopic -Urine, Clean Catch     Status: Abnormal   Collection Time: 04/02/23  7:41 AM  Result Value Ref Range   Color, Urine AMBER (A) YELLOW    Comment: BIOCHEMICALS MAY BE AFFECTED BY COLOR   APPearance CLEAR CLEAR   Specific Gravity, Urine >1.046 (H) 1.005 - 1.030   pH 5.0 5.0 - 8.0   Glucose, UA NEGATIVE NEGATIVE mg/dL   Hgb urine dipstick NEGATIVE NEGATIVE   Bilirubin Urine SMALL (A) NEGATIVE   Ketones, ur 80 (A) NEGATIVE mg/dL   Protein, ur 253 (A) NEGATIVE mg/dL   Nitrite NEGATIVE NEGATIVE   Leukocytes,Ua NEGATIVE NEGATIVE   RBC / HPF 11-20 0 - 5 RBC/hpf   WBC, UA 11-20 0 - 5 WBC/hpf   Bacteria, UA RARE (A) NONE SEEN   Squamous Epithelial / HPF 0-5 0 - 5 /HPF   Mucus PRESENT    Hyaline Casts, UA PRESENT     Comment: Performed at [facility], [address], [place], [place] [address]   No results found.  Pending Labs Unresulted Labs (From admission, onward)     Start     Ordered   04/03/23 0500  HIV Antibody (routine testing w rflx)  (HIV Antibody (Routine testing w reflex) panel)  Tomorrow morning,   R        04/02/23 1419   04/03/23 0500  Comprehensive metabolic panel  Tomorrow morning,   R        04/02/23 1419   04/03/23 0500  CBC  Tomorrow morning,   R        04/02/23 1419   04/03/23 0500  Lipase, blood  Daily,   R      04/02/23 1419   04/03/23  0500  Protime-INR  Daily,   R      04/02/23 1700   04/02/23 1701  Protime-INR  Once,   R        04/02/23 1700            Vitals/Pain Today's Vitals   04/02/23 1330 04/02/23 1343 04/02/23 1534 04/02/23 1622  BP: (!)  156/103     Pulse: 80     Resp:      Temp:    98.7 F (37.1 C)  TempSrc:    Oral  SpO2: 100%     Weight:      Height:      PainSc:  3  3      Isolation Precautions No active isolations  Medications Medications  HYDROmorphone (DILAUDID) injection 1 mg (has no administration in time range)  enoxaparin (LOVENOX) injection 40 mg (has no administration in time range)  acetaminophen (TYLENOL) tablet 650 mg (has no administration in time range)    Or  acetaminophen (TYLENOL) suppository 650 mg (has no administration in time range)  ondansetron (ZOFRAN) tablet 4 mg (has no administration in time range)    Or  ondansetron (ZOFRAN) injection 4 mg (has no administration in time range)  dextrose 5 % and 0.2 % NaCl with KCl 20 mEq infusion ( Intravenous New Bag/Given 04/02/23 1508)  oxyCODONE (Oxy IR/ROXICODONE) immediate release tablet 5 mg (has no administration in time range)  metoprolol succinate (TOPROL-XL) 24 hr tablet 12.5 mg (has no administration in time range)  oxyCODONE-acetaminophen (PERCOCET/ROXICET) 5-325 MG per tablet 1 tablet (1 tablet Oral Given 04/02/23 0740)  morphine (PF) 4 MG/ML injection 4 mg (4 mg Intravenous Given 04/02/23 1321)  ondansetron (ZOFRAN) injection 4 mg (4 mg Intravenous Given 04/02/23 1320)  lactated ringers bolus 1,000 mL (0 mLs Intravenous Stopped 04/02/23 1535)  pantoprazole (PROTONIX) injection 40 mg (40 mg Intravenous Given 04/02/23 1505)    Mobility walks

## 2023-04-03 DIAGNOSIS — K859 Acute pancreatitis without necrosis or infection, unspecified: Secondary | ICD-10-CM | POA: Diagnosis not present

## 2023-04-03 DIAGNOSIS — K863 Pseudocyst of pancreas: Secondary | ICD-10-CM | POA: Diagnosis not present

## 2023-04-03 LAB — LIPASE, BLOOD: Lipase: 72 U/L — ABNORMAL HIGH (ref 11–51)

## 2023-04-03 LAB — COMPREHENSIVE METABOLIC PANEL
ALT: 34 U/L (ref 0–44)
AST: 30 U/L (ref 15–41)
Albumin: 3.9 g/dL (ref 3.5–5.0)
Alkaline Phosphatase: 162 U/L — ABNORMAL HIGH (ref 38–126)
Anion gap: 11 (ref 5–15)
BUN: 7 mg/dL (ref 6–20)
CO2: 27 mmol/L (ref 22–32)
Calcium: 9.2 mg/dL (ref 8.9–10.3)
Chloride: 100 mmol/L (ref 98–111)
Creatinine, Ser: 0.51 mg/dL — ABNORMAL LOW (ref 0.61–1.24)
GFR, Estimated: >60 mL/min (ref >60)
Glucose, Bld: 121 mg/dL — ABNORMAL HIGH (ref 70–99)
Potassium: 3.7 mmol/L (ref 3.5–5.1)
Sodium: 138 mmol/L (ref 135–145)
Total Bilirubin: 0.6 mg/dL (ref 0.3–1.2)
Total Protein: 7 g/dL (ref 6.5–8.1)

## 2023-04-03 LAB — CBC
HCT: 36.2 % — ABNORMAL LOW (ref 39.0–52.0)
Hemoglobin: 11.9 g/dL — ABNORMAL LOW (ref 13.0–17.0)
MCH: 30.6 pg (ref 26.0–34.0)
MCHC: 32.9 g/dL (ref 30.0–36.0)
MCV: 93.1 fL (ref 80.0–100.0)
Platelets: 251 10*3/uL (ref 150–400)
RBC: 3.89 MIL/uL — ABNORMAL LOW (ref 4.22–5.81)
RDW: 13.1 % (ref 11.5–15.5)
WBC: 5.7 10*3/uL (ref 4.0–10.5)
nRBC: 0 % (ref 0.0–0.2)

## 2023-04-03 LAB — LIPID PANEL
Cholesterol: 160 mg/dL (ref 0–200)
HDL: 26 mg/dL — ABNORMAL LOW (ref >40)
LDL Cholesterol: 109 mg/dL — ABNORMAL HIGH (ref 0–99)
Total CHOL/HDL Ratio: 6.2 {ratio}
Triglycerides: 123 mg/dL (ref <150)
VLDL: 25 mg/dL (ref 0–40)

## 2023-04-03 LAB — PROTIME-INR
INR: 2.4 — ABNORMAL HIGH (ref 0.8–1.2)
Prothrombin Time: 26.7 s — ABNORMAL HIGH (ref 11.4–15.2)

## 2023-04-03 LAB — HIV ANTIBODY (ROUTINE TESTING W REFLEX): HIV Screen 4th Generation wRfx: NONREACTIVE

## 2023-04-03 MED ORDER — WARFARIN - PHARMACIST DOSING INPATIENT: Freq: Every day | Status: DC

## 2023-04-03 MED ORDER — OXYCODONE HCL 5 MG PO TABS: 5.0000 mg | ORAL_TABLET | Freq: Four times a day (QID) | ORAL | 0 refills | Status: DC | PRN

## 2023-04-03 NOTE — Progress Notes (Signed)
   04/03/23 1332  TOC Brief Assessment  Insurance and Status Reviewed  Patient has primary care physician Yes  Home environment has been reviewed Resides in single family home  Prior level of function: Independent at baseline  Prior/Current Home Services No current home services  Social Determinants of Health Reivew SDOH reviewed no interventions necessary  Readmission risk has been reviewed Yes  Transition of care needs no transition of care needs at this time

## 2023-04-03 NOTE — Discharge Summary (Signed)
Physician Discharge Summary   [name] [name] NGE:[id number] DOB: [birth year] DOA: 04/02/2023  PCP: [name], NP  Admit date: 04/02/2023 Discharge date: 04/03/2023   Admitted From: Home Disposition:  Home Discharging physician: [name] Chamber, MD Barriers to discharge: none  Recommendations at discharge: Follow up with GI/establish care on 12/2 as scheduled  Discharge Condition: stable CODE STATUS: Full Diet recommendation:  Diet Orders (From admission, onward)     Start     Ordered   04/03/23 1315  DIET SOFT Room service appropriate? Yes; Fluid consistency: Thin  Diet effective now       Question Answer Comment  Room service appropriate? 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  Lipase was elevated at 453.  LFTs were normal except for elevated alk phos. CT abdomen/pelvis showed acute pancreatitis with 2.2 cm pseudocyst in the pancreatic head with ductal dilatation and calcifications in the pancreatic head compatible with chronic pancreatitis.  Fatty liver also noted.  Triglycerides normal and no other obvious precipitating factors. He was initiated on bowel rest, fluids, and pain/nausea control. Symptoms slowly improved and diet was advanced which he tolerated prior to discharge.  He has outpatient appointment scheduled with GI to establish care in December.   The patient's acute and chronic medical conditions were treated accordingly. On day of discharge, patient was felt deemed stable for discharge. Patient/family member advised to call PCP or come back to ER if needed.   Principal Diagnosis: Acute  pancreatitis  Discharge Diagnoses: Active Hospital Problems   Diagnosis Date Noted   Chronic pancreatitis (HCC) 01/23/2022    Priority: 1.   Pseudocyst of pancreas 04/03/2023    Priority: 2.   S/P AVR (aortic valve replacement) and aortoplasty 09/27/2021    Priority: 3.   Essential hypertension 01/23/2022    Priority: 4.   Gastroesophageal reflux disease without esophagitis 03/05/2023    Resolved Hospital Problems   Diagnosis Date Noted Date Resolved   Acute pancreatitis 06/06/2020 04/03/2023    Priority: 1.     Discharge Instructions     Increase activity slowly   Complete by: As directed       Allergies as of 04/03/2023       Reactions   Amoxicillin Hives        Medication List     TAKE these medications    acetaminophen 325 MG tablet Commonly known as: Tylenol Take 2 tablets (650 mg total) by mouth every 6 (six) hours as needed for up to 30 doses for mild pain or moderate pain.   aspirin EC 81 MG tablet Take 1 tablet (81 mg total) by mouth daily. Swallow whole.   loratadine 10 MG tablet Commonly known as: CLARITIN Take 10 mg by mouth daily.   METAMUCIL FIBER PO Take 10 mLs by mouth every other day.   metoprolol succinate 25 MG 24 hr tablet Commonly known as: Toprol XL Take 0.5 tablets (12.5 mg total) by mouth at bedtime.   oxyCODONE 5 MG immediate release tablet Commonly known as: Roxicodone Take 1 tablet (5 mg total) by mouth every 6 (six) hours as needed for up to  15 doses for severe pain (pain score 7-10) or breakthrough pain. What changed: reasons to take this   pantoprazole 20 MG tablet Commonly known as: PROTONIX Take 1 tablet (20 mg total) by mouth daily. Take 1 pill twice a day for 2 weeks, then 1 pill every morning.   warfarin 3 MG tablet Commonly known as: COUMADIN Take as directed. If you are unsure how to take this medication, talk to your nurse or doctor. Original instructions: TAKE 1 AND 1/2 TABLETS BY MOUTH DAILY. EXCEPT TAKE 2  TABLETS ON MONDAYS, WEDNESDAYS AND FRIDAYS OR AS DIRECTED BY COAGULATION CLINIC What changed: See the new instructions.        Allergies  Allergen Reactions   Amoxicillin Hives    Consultations:   Procedures:   Discharge Exam: BP (!) 134/97 (BP Location: Left Arm) Comment: notify to the nurse.  Pulse 75   Temp 98 F (36.7 C) (Oral)   Resp 14   Ht 5\' 11"  (1.803 m)   Wt 77.1 kg   SpO2 96%   BMI 23.71 kg/m  Physical Exam Constitutional:      General: He is not in acute distress.    Appearance: Normal appearance.  HENT:     Head: Normocephalic and atraumatic.     Mouth/Throat:     Mouth: Mucous membranes are moist.  Eyes:     Extraocular Movements: Extraocular movements intact.  Cardiovascular:     Rate and Rhythm: Normal rate and regular rhythm.     Comments: Mechanical valve click appreciated Pulmonary:     Effort: Pulmonary effort is normal. No respiratory distress.     Breath sounds: Normal breath sounds. No wheezing.  Abdominal:     General: Bowel sounds are normal. There is no distension.     Palpations: Abdomen is soft.     Tenderness: There is no abdominal tenderness.  Musculoskeletal:        General: Normal range of motion.     Cervical back: Normal range of motion and neck supple.  Skin:    General: Skin is warm and dry.  Neurological:     General: No focal deficit present.     Mental Status: He is alert.  Psychiatric:        Mood and Affect: Mood normal.        Behavior: Behavior normal.      The results of significant diagnostics from this hospitalization (including imaging, microbiology, ancillary and laboratory) are listed below for reference.   Microbiology: No results found for this or any previous visit (from the past 240 hour(s)).   Labs: BNP (last 3 results) No results for input(s): "BNP" in the last 8760 hours. Basic Metabolic Panel: Recent Labs  Lab 04/02/23 0741 04/03/23 0449  NA 137 138  K 3.5 3.7  CL 99 100  CO2 22 27   GLUCOSE 111* 121*  BUN 11 7  CREATININE 0.63 0.51*  CALCIUM 9.7 9.2   Liver Function Tests: Recent Labs  Lab 04/02/23 0741 04/03/23 0449  AST 46* 30  ALT 39 34  ALKPHOS 201* 162*  BILITOT 1.0 0.6  PROT 8.9* 7.0  ALBUMIN 4.9 3.9   Recent Labs  Lab 04/02/23 0741 04/03/23 0449  LIPASE 453* 72*   No results for input(s): "AMMONIA" in the last 168 hours. CBC: Recent Labs  Lab 04/02/23 0741 04/03/23 0449  WBC 8.3 5.7  NEUTROABS 5.4  --   HGB 13.7 11.9*  HCT 41.3 36.2*  MCV 92.2 93.1  PLT 400 251   Cardiac Enzymes: No results for input(s): "CKTOTAL", "CKMB", "CKMBINDEX", "TROPONINI" in the last 168 hours. BNP: Invalid input(s): "POCBNP" CBG: No results for input(s): "GLUCAP" in the last 168 hours. D-Dimer No results for input(s): "DDIMER" in the last 72 hours. Hgb A1c No results for input(s): "HGBA1C" in the last 72 hours. Lipid Profile Recent Labs    04/03/23 0449  CHOL 160  HDL 26*  LDLCALC 109*  TRIG 123  CHOLHDL 6.2   Thyroid function studies No results for input(s): "TSH", "T4TOTAL", "T3FREE", "THYROIDAB" in the last 72 hours.  Invalid input(s): "FREET3" Anemia work up No results for input(s): "VITAMINB12", "FOLATE", "FERRITIN", "TIBC", "IRON", "RETICCTPCT" in the last 72 hours. Urinalysis    Component Value Date/Time   COLORURINE AMBER (A) 04/02/2023 0741   APPEARANCEUR CLEAR 04/02/2023 0741   LABSPEC >1.046 (H) 04/02/2023 0741   PHURINE 5.0 04/02/2023 0741   GLUCOSEU NEGATIVE 04/02/2023 0741   HGBUR NEGATIVE 04/02/2023 0741   BILIRUBINUR SMALL (A) 04/02/2023 0741   KETONESUR 80 (A) 04/02/2023 0741   PROTEINUR 100 (A) 04/02/2023 0741   NITRITE NEGATIVE 04/02/2023 0741   LEUKOCYTESUR NEGATIVE 04/02/2023 0741   Sepsis Labs Recent Labs  Lab 04/02/23 0741 04/03/23 0449  WBC 8.3 5.7   Microbiology No results found for this or any previous visit (from the past 240 hour(s)).  Procedures/Studies: CT ABDOMEN PELVIS W CONTRAST  Result  Date: 03/12/2023 CLINICAL DATA:  Pancreatitis, acute, severe. Mid back pain. Nausea, vomiting EXAM: CT ABDOMEN AND PELVIS WITH CONTRAST TECHNIQUE: Multidetector CT imaging of the abdomen and pelvis was performed using the standard protocol following bolus administration of intravenous contrast. RADIATION DOSE REDUCTION: This exam was performed according to the departmental dose-optimization program which includes automated exposure control, adjustment of the mA and/or kV according to patient size and/or use of iterative reconstruction technique. CONTRAST:  OMNIPAQUE IOHEXOL 300 MG/ML  SOLN COMPARISON:  01/23/2022 FINDINGS: Lower chest: No acute abnormality Hepatobiliary: Diffuse low-density throughout the liver compatible with fatty infiltration. No focal abnormality. Gallbladder unremarkable. Pancreas: Calcifications in the pancreatic head compatible with chronic pancreatitis. Inflammatory stranding around the pancreas compatible with acute pancreatitis. There is a 2.2 cm cyst in the pancreatic head, likely pseudocyst. Ductal dilatation noted. Spleen: No focal abnormality.  Normal size. Adrenals/Urinary Tract: No adrenal abnormality. No focal renal abnormality. No stones or hydronephrosis. Urinary bladder is unremarkable. Stomach/Bowel: Normal appendix. Stomach, large and small bowel grossly unremarkable. Vascular/Lymphatic: No evidence of aneurysm or adenopathy. Reproductive: No visible focal abnormality. Other: No free fluid or free air.2 Musculoskeletal: No acute bony abnormality. IMPRESSION: Inflammatory changes around the pancreas compatible with acute pancreatitis. 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 Calcifications in the pancreatic head compatible with chronic pancreatitis. Fatty liver. Electronically Signed   By: Charlett Nose M.D.   On: 03/12/2023 21:01     Time coordinating discharge: Over 30 minutes    [name] Chamber, MD  [facility] 04/03/2023,  6:51 PM

## 2023-04-03 NOTE — Plan of Care (Signed)

## 2023-04-03 NOTE — Progress Notes (Signed)
PHARMACY - ANTICOAGULATION CONSULT NOTE  Pharmacy Consult for warfarin Indication: mechanical AVR  Allergies  Allergen Reactions   Amoxicillin Hives   Patient Measurements: Height: 5\' 11"  (180.3 cm) Weight: 77.1 kg (170 lb) IBW/kg (Calculated) : 75.3  Vital Signs: Temp: 97.6 F (36.4 C) (10/23 1002) Temp Source: Oral (10/23 1002) BP: 125/91 (10/23 1002) Pulse Rate: 72 (10/23 1002)  Labs: Recent Labs    04/02/23 0741 04/02/23 1757 04/03/23 0449  HGB 13.7  --  11.9*  HCT 41.3  --  36.2*  PLT 400  --  251  LABPROT  --  27.3* 26.7*  INR  --  2.5* 2.4*  CREATININE 0.63  --  0.51*    Estimated Creatinine Clearance: 141.2 mL/min (A) (by C-G formula based on SCr of 0.51 mg/dL (L)).    Assessment: 22 yoM with PMH AVR on warfarin PTA, admitted 10/22 for recurrent pancreatitis. 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 Pharmacy to continue warfarin while admitted.  OF NOTE: patient underwent replacement of aortic valve in April 2023 using a newer generation ON-X valve. Following results of the PROACT trial, the valve received FDA approval with an INR goal of 1.5-2.0 when used in the aortic position.  Baseline INR elevated (above goal range) Prior anticoagulation: warfarin 4.5 mg daily, except 6 mg on Mondays (LD 10/21 - presumably 6 mg)  Today, 04/03/2023: CBC: Hg 13.7> 11.9 (was 12.8 10/1) , PLT 400> 251  INR 2.4 is supra-therapeutic (suspect d/t reduced PO intake) Major drug interactions: none noted; patient chronically on ASA 81 mg/day No bleeding issues per nursing PO intake limited d/t acute pancreatitis  Goal of Therapy: INR 2-3  Plan: Hold warfarin again tonight Daily INR CBC at least q72 hr while on warfarin Monitor for signs of bleeding or thrombosis   Herby Abraham, Pharm.D Use secure chat for questions 04/03/2023 12:49 PM

## 2023-04-03 NOTE — Hospital Course (Signed)
[name] [name] is a 32 yo male with PMH pancreatitis with pseudocyst formation, AVR s/p mechanical valve replacement (09/27/21), ascending aortic aneurysm repair (same date), asthma, etoh abuse (in remission) who presented to the ER with worsening abdominal pain and inability to maintain adequate oral intake.  Lipase was elevated at 453.  LFTs were normal except for elevated alk phos. CT abdomen/pelvis showed acute pancreatitis with 2.2 cm pseudocyst in the pancreatic head with ductal dilatation and calcifications in the pancreatic head compatible with chronic pancreatitis.  Fatty liver also noted.  Triglycerides normal and no other obvious precipitating factors. He was initiated on bowel rest, fluids, and pain/nausea control. Symptoms slowly improved and diet was advanced which he tolerated prior to discharge.  He has outpatient appointment scheduled with GI to establish care in December.

## 2023-04-11 ENCOUNTER — Observation Stay (HOSPITAL_COMMUNITY)
Admission: EM | Admit: 2023-04-11 | Discharge: 2023-04-13 | Disposition: A | Discharge status: Home / Self Care | Payer: Commercial Managed Care - PPO | Attending: Emergency Medicine | Admitting: Emergency Medicine

## 2023-04-11 ENCOUNTER — Encounter (HOSPITAL_COMMUNITY): Payer: Self-pay

## 2023-04-11 ENCOUNTER — Emergency Department (HOSPITAL_COMMUNITY): Payer: Commercial Managed Care - PPO

## 2023-04-11 ENCOUNTER — Other Ambulatory Visit: Payer: Self-pay

## 2023-04-11 ENCOUNTER — Ambulatory Visit
Admission: RE | Admit: 2023-04-11 | Discharge: 2023-04-11 | Disposition: A | Discharge status: Home / Self Care | Payer: Commercial Managed Care - PPO | Source: Ambulatory Visit | Attending: Internal Medicine | Admitting: Internal Medicine

## 2023-04-11 DIAGNOSIS — D649 Anemia, unspecified: Secondary | ICD-10-CM | POA: Diagnosis not present

## 2023-04-11 DIAGNOSIS — K760 Fatty (change of) liver, not elsewhere classified: Secondary | ICD-10-CM | POA: Diagnosis present

## 2023-04-11 DIAGNOSIS — J45909 Unspecified asthma, uncomplicated: Secondary | ICD-10-CM | POA: Insufficient documentation

## 2023-04-11 DIAGNOSIS — D75839 Thrombocytosis, unspecified: Secondary | ICD-10-CM | POA: Insufficient documentation

## 2023-04-11 DIAGNOSIS — K859 Acute pancreatitis without necrosis or infection, unspecified: Principal | ICD-10-CM | POA: Diagnosis present

## 2023-04-11 DIAGNOSIS — K863 Pseudocyst of pancreas: Secondary | ICD-10-CM | POA: Diagnosis present

## 2023-04-11 DIAGNOSIS — I10 Essential (primary) hypertension: Secondary | ICD-10-CM | POA: Diagnosis not present

## 2023-04-11 DIAGNOSIS — Z880 Allergy status to penicillin: Secondary | ICD-10-CM | POA: Diagnosis not present

## 2023-04-11 DIAGNOSIS — Z7982 Long term (current) use of aspirin: Secondary | ICD-10-CM | POA: Insufficient documentation

## 2023-04-11 DIAGNOSIS — R634 Abnormal weight loss: Secondary | ICD-10-CM | POA: Diagnosis present

## 2023-04-11 DIAGNOSIS — Z7901 Long term (current) use of anticoagulants: Secondary | ICD-10-CM | POA: Diagnosis not present

## 2023-04-11 DIAGNOSIS — F1010 Alcohol abuse, uncomplicated: Secondary | ICD-10-CM | POA: Diagnosis present

## 2023-04-11 DIAGNOSIS — K860 Alcohol-induced chronic pancreatitis: Secondary | ICD-10-CM | POA: Diagnosis present

## 2023-04-11 DIAGNOSIS — F1011 Alcohol abuse, in remission: Secondary | ICD-10-CM | POA: Diagnosis not present

## 2023-04-11 DIAGNOSIS — Z952 Presence of prosthetic heart valve: Secondary | ICD-10-CM | POA: Insufficient documentation

## 2023-04-11 DIAGNOSIS — Z79899 Other long term (current) drug therapy: Secondary | ICD-10-CM | POA: Insufficient documentation

## 2023-04-11 DIAGNOSIS — K219 Gastro-esophageal reflux disease without esophagitis: Secondary | ICD-10-CM | POA: Diagnosis present

## 2023-04-11 DIAGNOSIS — D72829 Elevated white blood cell count, unspecified: Secondary | ICD-10-CM | POA: Diagnosis not present

## 2023-04-11 DIAGNOSIS — Z6823 Body mass index (BMI) 23.0-23.9, adult: Secondary | ICD-10-CM

## 2023-04-11 DIAGNOSIS — Z23 Encounter for immunization: Secondary | ICD-10-CM | POA: Insufficient documentation

## 2023-04-11 DIAGNOSIS — R1010 Upper abdominal pain, unspecified: Secondary | ICD-10-CM | POA: Diagnosis present

## 2023-04-11 DIAGNOSIS — K852 Alcohol induced acute pancreatitis without necrosis or infection: Secondary | ICD-10-CM | POA: Diagnosis present

## 2023-04-11 DIAGNOSIS — K858 Other acute pancreatitis without necrosis or infection: Principal | ICD-10-CM

## 2023-04-11 DIAGNOSIS — K861 Other chronic pancreatitis: Secondary | ICD-10-CM | POA: Diagnosis not present

## 2023-04-11 LAB — CBC WITH DIFFERENTIAL/PLATELET
Abs Immature Granulocytes: 0.07 10*3/uL (ref 0.00–0.07)
Basophils Absolute: 0 10*3/uL (ref 0.0–0.1)
Basophils Relative: 0 %
Eosinophils Absolute: 0.7 10*3/uL — ABNORMAL HIGH (ref 0.0–0.5)
Eosinophils Relative: 5 %
HCT: 39.6 % (ref 39.0–52.0)
Hemoglobin: 13 g/dL (ref 13.0–17.0)
Immature Granulocytes: 1 %
Lymphocytes Relative: 16 %
Lymphs Abs: 2.2 10*3/uL (ref 0.7–4.0)
MCH: 30.3 pg (ref 26.0–34.0)
MCHC: 32.8 g/dL (ref 30.0–36.0)
MCV: 92.3 fL (ref 80.0–100.0)
Monocytes Absolute: 1.1 10*3/uL — ABNORMAL HIGH (ref 0.1–1.0)
Monocytes Relative: 8 %
Neutro Abs: 9.4 10*3/uL — ABNORMAL HIGH (ref 1.7–7.7)
Neutrophils Relative %: 70 %
Platelets: 418 10*3/uL — ABNORMAL HIGH (ref 150–400)
RBC: 4.29 MIL/uL (ref 4.22–5.81)
RDW: 12.9 % (ref 11.5–15.5)
WBC: 13.5 10*3/uL — ABNORMAL HIGH (ref 4.0–10.5)
nRBC: 0 % (ref 0.0–0.2)

## 2023-04-11 LAB — LIPASE, BLOOD: Lipase: 103 U/L — ABNORMAL HIGH (ref 11–51)

## 2023-04-11 LAB — URINALYSIS, ROUTINE W REFLEX MICROSCOPIC
Bilirubin Urine: NEGATIVE
Glucose, UA: NEGATIVE mg/dL
Ketones, ur: NEGATIVE mg/dL
Leukocytes,Ua: NEGATIVE
Nitrite: NEGATIVE
Protein, ur: NEGATIVE mg/dL
RBC / HPF: >50 RBC/hpf (ref 0–5)
Specific Gravity, Urine: 1.016 (ref 1.005–1.030)
pH: 7 (ref 5.0–8.0)

## 2023-04-11 LAB — COMPREHENSIVE METABOLIC PANEL
ALT: 38 U/L (ref 0–44)
AST: 29 U/L (ref 15–41)
Albumin: 4.6 g/dL (ref 3.5–5.0)
Alkaline Phosphatase: 68 U/L (ref 38–126)
Anion gap: 14 (ref 5–15)
BUN: 12 mg/dL (ref 6–20)
CO2: 28 mmol/L (ref 22–32)
Calcium: 9.7 mg/dL (ref 8.9–10.3)
Chloride: 101 mmol/L (ref 98–111)
Creatinine, Ser: 0.83 mg/dL (ref 0.61–1.24)
GFR, Estimated: >60 mL/min (ref >60)
Glucose, Bld: 113 mg/dL — ABNORMAL HIGH (ref 70–99)
Potassium: 3.7 mmol/L (ref 3.5–5.1)
Sodium: 143 mmol/L (ref 135–145)
Total Bilirubin: 0.5 mg/dL (ref 0.3–1.2)
Total Protein: 8 g/dL (ref 6.5–8.1)

## 2023-04-11 MED ORDER — SODIUM CHLORIDE 0.9 % IV BOLUS
1000.0000 mL | Freq: Once | INTRAVENOUS | Status: AC
  Administered 2023-04-11: 1000 mL via INTRAVENOUS

## 2023-04-11 MED ORDER — ASPIRIN 81 MG PO TBEC
81.0000 mg | DELAYED_RELEASE_TABLET | Freq: Every day | ORAL | Status: DC
  Administered 2023-04-12 – 2023-04-13 (×2): 81 mg via ORAL
  Filled 2023-04-11 (×2): qty 1

## 2023-04-11 MED ORDER — ONDANSETRON HCL 4 MG/2ML IJ SOLN
4.0000 mg | Freq: Once | INTRAMUSCULAR | Status: AC
  Administered 2023-04-11: 4 mg via INTRAVENOUS
  Filled 2023-04-11: qty 2

## 2023-04-11 MED ORDER — OXYCODONE HCL 5 MG PO TABS
5.0000 mg | ORAL_TABLET | Freq: Four times a day (QID) | ORAL | Status: DC | PRN
  Administered 2023-04-11 – 2023-04-13 (×6): 5 mg via ORAL
  Filled 2023-04-11 (×7): qty 1

## 2023-04-11 MED ORDER — MELATONIN 5 MG PO TABS: 5.0000 mg | ORAL_TABLET | Freq: Every evening | ORAL | Status: DC | PRN

## 2023-04-11 MED ORDER — WARFARIN SODIUM 3 MG PO TABS: 3.0000 mg | ORAL_TABLET | ORAL | Status: DC

## 2023-04-11 MED ORDER — FENTANYL CITRATE PF 50 MCG/ML IJ SOSY
50.0000 ug | PREFILLED_SYRINGE | Freq: Once | INTRAMUSCULAR | Status: AC
  Administered 2023-04-11: 50 ug via INTRAVENOUS
  Filled 2023-04-11: qty 1

## 2023-04-11 MED ORDER — POLYETHYLENE GLYCOL 3350 17 G PO PACK: 17.0000 g | PACK | Freq: Every day | ORAL | Status: DC | PRN

## 2023-04-11 MED ORDER — PROCHLORPERAZINE EDISYLATE 10 MG/2ML IJ SOLN
5.0000 mg | Freq: Four times a day (QID) | INTRAMUSCULAR | Status: DC | PRN
  Administered 2023-04-12: 5 mg via INTRAVENOUS
  Filled 2023-04-11: qty 2

## 2023-04-11 MED ORDER — HYDROMORPHONE HCL 1 MG/ML IJ SOLN
0.5000 mg | INTRAMUSCULAR | Status: DC | PRN
  Administered 2023-04-12 – 2023-04-13 (×3): 0.5 mg via INTRAVENOUS
  Filled 2023-04-11 (×3): qty 0.5

## 2023-04-11 MED ORDER — SODIUM CHLORIDE 0.9 % IV SOLN: INTRAVENOUS | Status: AC

## 2023-04-11 MED ORDER — MORPHINE SULFATE (PF) 4 MG/ML IV SOLN
4.0000 mg | Freq: Once | INTRAVENOUS | Status: AC
  Administered 2023-04-11: 4 mg via INTRAVENOUS
  Filled 2023-04-11: qty 1

## 2023-04-11 MED ORDER — IOHEXOL 300 MG/ML  SOLN
100.0000 mL | Freq: Once | INTRAMUSCULAR | Status: AC | PRN
  Administered 2023-04-11: 100 mL via INTRAVENOUS

## 2023-04-11 NOTE — Progress Notes (Signed)
PHARMACY - ANTICOAGULATION CONSULT NOTE  Pharmacy Consult for warfarin Indication: mechanical valve  Allergies  Allergen Reactions   Amoxicillin Hives    Patient Measurements: Height: 5\' 11"  (180.3 cm) Weight: 74.8 kg (165 lb) IBW/kg (Calculated) : 75.3 Heparin Dosing Weight:   Vital Signs: Temp: 98.2 F (36.8 C) (10/31 1629) Temp Source: Oral (10/31 1629) BP: 167/94 (10/31 2100) Pulse Rate: 65 (10/31 2100)  Labs: Recent Labs    04/11/23 1659  HGB 13.0  HCT 39.6  PLT 418*  CREATININE 0.83    Estimated Creatinine Clearance: 135.2 mL/min (by C-G formula based on SCr of 0.83 mg/dL).   Medical History: Past Medical History:  Diagnosis Date   Asthma    Headache    migraines   Heart murmur    Hypokalemia 06/08/2020   Leukocytosis 01/23/2022   Mild aortic stenosis    Pancreatitis    Pneumonia    as a child x several   Substance abuse (HCC)     Medications:  Home warfarin dose 6mg  on M,W,F and 4.5mg  all other days  Assessment: 32 y.o. male with PMHx recurrent pancreatitis, asthma, headache, substance use disorder who presents to ED concerned for LUQ abdominal pain, nausea, fatigue. Pharmacy to dose warfarin  LD 10/30 @ 1600  Goal of Therapy:  INR 1.5-2 OF NOTE: patient underwent replacement of aortic valve in April 2023 using a newer generation ON-X valve. Following results of the PROACT trial, the valve received FDA approval with an INR goal of 1.5-2.0 when used in the aortic position.    Plan:  INR unknown at this time Daily INR and dose accordingly  Arley Phenix RPh 04/11/2023, 10:35 PM

## 2023-04-11 NOTE — ED Provider Notes (Signed)
Patient redirected to the emergency room as he was just hospitalized for pancreatitis and our medical director does not allow for refills of narcotic pain medications.  Patient contracts for safety and will present there now.   [name] [name], [place] 04/11/23 1555

## 2023-04-11 NOTE — ED Triage Notes (Signed)
Pt c/o abd/back pain, chills, n/v started 9am-states feels is a pancreatitis flare and requesting rx refill vs a 3rd ED visit-NAD-steady gait

## 2023-04-11 NOTE — ED Provider Notes (Signed)
[place] EMERGENCY DEPARTMENT AT [facility] Provider Note   CSN: [id number] Arrival date & time: 04/11/23  1616     History  No chief complaint on file.   [name] [name] is a 32 y.o. male with PMHx recurrent pancreatitis, asthma, headache, substance use disorder who presents to ED concerned for LUQ abdominal pain, nausea, fatigue since 9am. Recently discharged for acute pancreatitis 8 days ago. Denies alcohol use recently. Patient is unsure why he has recurrent pancreatitis.   Denies fever, chest pain, dyspnea, cough, vomiting, diarrhea, dysuria, hematuria, hematochezia.    Abdominal Pain      Home Medications Prior to Admission medications   Medication Sig Start Date End Date Taking? Authorizing Provider  acetaminophen (TYLENOL) 325 MG tablet Take 2 tablets (650 mg total) by mouth every 6 (six) hours as needed for up to 30 doses for mild pain or moderate pain. 03/12/23   Terald Sleeper, MD  aspirin EC 81 MG EC tablet Take 1 tablet (81 mg total) by mouth daily. Swallow whole. 10/02/21   Barrett, Erin R, [name]  loratadine (CLARITIN) 10 MG tablet Take 10 mg by mouth daily.    [provider]  METAMUCIL FIBER PO Take 10 mLs by mouth every other day.    [provider]  metoprolol succinate (TOPROL XL) 25 MG 24 hr tablet Take 0.5 tablets (12.5 mg total) by mouth at bedtime. 11/01/22   Sharlene Dory, [name]  oxyCODONE (ROXICODONE) 5 MG immediate release tablet Take 1 tablet (5 mg total) by mouth every 6 (six) hours as needed for up to 15 doses for severe pain (pain score 7-10) or breakthrough pain. 04/03/23   Lewie Chamber, MD  pantoprazole (PROTONIX) 20 MG tablet Take 1 tablet (20 mg total) by mouth daily. Take 1 pill twice a day for 2 weeks, then 1 pill every morning. 03/05/23   [name], NP  warfarin (COUMADIN) 3 MG tablet TAKE 1 AND 1/2 TABLETS BY MOUTH DAILY. EXCEPT TAKE 2 TABLETS ON MONDAYS, WEDNESDAYS AND FRIDAYS OR AS  DIRECTED BY COAGULATION CLINIC Patient taking differently: Take 3 mg by mouth See admin instructions. TAKE 1 AND 1/2 TABLETS BY MOUTH DAILY. EXCEPT TAKE 2 TABLETS ON MONDAYS, WEDNESDAYS AND FRIDAYS OR AS DIRECTED BY COAGULATION CLINIC 04/01/23   Orbie Pyo, MD      Allergies    Amoxicillin    Review of Systems   Review of Systems  Gastrointestinal:  Positive for abdominal pain.    Physical Exam Updated Vital Signs BP (!) 167/94   Pulse 65   Temp 98.2 F (36.8 C) (Oral)   Resp 16   Ht 5\' 11"  (1.803 m)   Wt 74.8 kg   SpO2 98%   BMI 23.01 kg/m  Physical Exam Vitals and nursing note reviewed.  Constitutional:      General: He is not in acute distress.    Appearance: He is not ill-appearing or toxic-appearing.  HENT:     Head: Normocephalic and atraumatic.     Mouth/Throat:     Mouth: Mucous membranes are moist.  Eyes:     General: No scleral icterus.       Right eye: No discharge.        Left eye: No discharge.     Conjunctiva/sclera: Conjunctivae normal.  Cardiovascular:     Rate and Rhythm: Normal rate and regular rhythm.     Pulses: Normal pulses.     Heart sounds: Normal heart sounds. No  murmur heard. Pulmonary:     Effort: Pulmonary effort is normal. No respiratory distress.     Breath sounds: Normal breath sounds. No wheezing, rhonchi or rales.  Abdominal:     General: Abdomen is flat. Bowel sounds are normal. There is no distension.     Palpations: Abdomen is soft. There is no mass.     Tenderness: There is abdominal tenderness.     Comments: Mild RUQ tenderness to palpation  Musculoskeletal:     Right lower leg: No edema.     Left lower leg: No edema.  Skin:    General: Skin is warm and dry.     Findings: No rash.  Neurological:     General: No focal deficit present.     Mental Status: He is alert. Mental status is at baseline.  Psychiatric:        Mood and Affect: Mood normal.        Behavior: Behavior normal.     ED Results / Procedures /  Treatments   Labs (all labs ordered are listed, but only abnormal results are displayed) Labs Reviewed  CBC WITH DIFFERENTIAL/PLATELET - Abnormal; Notable for the following components:      Result Value   WBC 13.5 (*)    Platelets 418 (*)    Neutro Abs 9.4 (*)    Monocytes Absolute 1.1 (*)    Eosinophils Absolute 0.7 (*)    All other components within normal limits  COMPREHENSIVE METABOLIC PANEL - Abnormal; Notable for the following components:   Glucose, Bld 113 (*)    All other components within normal limits  LIPASE, BLOOD - Abnormal; Notable for the following components:   Lipase 103 (*)    All other components within normal limits  URINALYSIS, ROUTINE W REFLEX MICROSCOPIC - Abnormal; Notable for the following components:   Hgb urine dipstick MODERATE (*)    Bacteria, UA RARE (*)    All other components within normal limits    EKG None  Radiology CT ABDOMEN PELVIS W CONTRAST  Result Date: 04/11/2023 CLINICAL DATA:  Abdominal pain and back pain with chills, nausea and vomiting. EXAM: CT ABDOMEN AND PELVIS WITH CONTRAST TECHNIQUE: Multidetector CT imaging of the abdomen and pelvis was performed using the standard protocol following bolus administration of intravenous contrast. RADIATION DOSE REDUCTION: This exam was performed according to the departmental dose-optimization program which includes automated exposure control, adjustment of the mA and/or kV according to patient size and/or use of iterative reconstruction technique. CONTRAST:  OMNIPAQUE IOHEXOL 300 MG/ML  SOLN COMPARISON:  March 12, 2023 FINDINGS: Lower chest: No acute abnormality. Multiple sternal wires are noted. Hepatobiliary: There is diffuse fatty infiltration of the liver parenchyma. No focal liver abnormality is seen. No gallstones, gallbladder wall thickening, or biliary dilatation. Pancreas: There is mild to moderate severity peripancreatic inflammatory fat stranding. This is mildly increased in severity  when compared to the prior exam. A 3.7 cm x 2.6 cm x 2.7 cm partially calcified area of low attenuation is again seen within the head of the pancreas. Pancreatic ductal dilatation is noted (approximately 9 mm in diameter within the region of the body of the pancreas). Spleen: No splenic injury or perisplenic hematoma. Adrenals/Urinary Tract: Adrenal glands are unremarkable. Kidneys are normal, without renal calculi, focal lesion, or hydronephrosis. The urinary bladder is poorly distended and subsequently limited in evaluation. Stomach/Bowel: Stomach is within normal limits. Appendix appears normal. No evidence of bowel wall thickening, distention, or inflammatory changes. Vascular/Lymphatic: No significant  vascular findings are present. No enlarged abdominal or pelvic lymph nodes. Reproductive: Prostate is unremarkable. Other: No abdominal wall hernia or abnormality. No abdominopelvic ascites. Musculoskeletal: No acute or significant osseous findings. IMPRESSION: 1. Acute pancreatitis, mildly increased in severity when compared to the prior exam. 2. 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 3. Hepatic steatosis. Electronically Signed   By: Aram Candela M.D.   On: 04/11/2023 21:14    Procedures .Critical Care  Performed by: Dorthy Cooler, [name] Authorized by: Dorthy Cooler, [name]   Critical care provider statement:    Critical care time (minutes):  30   Critical care was necessary to treat or prevent imminent or life-threatening deterioration of the following conditions: pancreatitis.   Critical care was time spent personally by me on the following activities:  Development of treatment plan with patient or surrogate, discussions with consultants, evaluation of patient's response to treatment, examination of patient, ordering and review of laboratory studies, ordering and review of radiographic studies,  ordering and performing treatments and interventions, pulse oximetry, re-evaluation of patient's condition and review of old charts   Care discussed with: admitting provider       Medications Ordered in ED Medications  oxyCODONE (Oxy IR/ROXICODONE) immediate release tablet 5 mg (has no administration in time range)  HYDROmorphone (DILAUDID) injection 0.5 mg (has no administration in time range)  prochlorperazine (COMPAZINE) injection 5 mg (has no administration in time range)  polyethylene glycol (MIRALAX / GLYCOLAX) packet 17 g (has no administration in time range)  melatonin tablet 5 mg (has no administration in time range)  fentaNYL (SUBLIMAZE) injection 50 mcg (50 mcg Intravenous Given 04/11/23 1917)  iohexol (OMNIPAQUE) 300 MG/ML solution 100 mL (100 mLs Intravenous Contrast Given 04/11/23 1929)  morphine (PF) 4 MG/ML injection 4 mg (4 mg Intravenous Given 04/11/23 2047)  ondansetron (ZOFRAN) injection 4 mg (4 mg Intravenous Given 04/11/23 2047)  sodium chloride 0.9 % bolus 1,000 mL (1,000 mLs Intravenous Bolus 04/11/23 2142)    ED Course/ Medical Decision Making/ A&P                                 Medical Decision Making Risk Prescription drug management.    This patient presents to the ED for concern of abdominal pain, this involves an extensive number of treatment options, and is a complaint that carries with it a high risk of complications and morbidity.  The differential diagnosis includes gastroenteritis, colitis, small bowel obstruction, appendicitis, cholecystitis, pancreatitis, nephrolithiasis, UTI, pyleonephritis, testicular torsion.   Co morbidities that complicate the patient evaluation  recurrent pancreatitis, asthma, headache, substance use disorder   Additional history obtained:  Discharged for acute pancreatitis 04/03/2023   Lab Tests:  I Ordered, and personally interpreted labs.  The pertinent results include: CBC with differential: leukocytosis at  13.5. no anemia. CMP: no concern for electrolyte abnormality; no concern for kidney/liver damage Lipase: elevated at 103 UA: not concerning for infection    Imaging Studies ordered:  I ordered imaging studies including   -CT Abd/Pelvis with contrast: evaluate for structural/surgical etiology of patients' severe abdominal pain.  I independently visualized and interpreted imaging I agree with the radiologist interpretation    Problem List / ED Course / Critical interventions / Medication management  Admitting patient for acute pancreatitis. Patient presented for RUQ pain, nausea, and fatigue since 9am. Patient with hx of  recurrent pancreatitis and was recently discharged from hospital for acute pancreatitis 8 days ago.  Physical exam with tenderness to palpation of RUQ.  Patient afebrile with stable vitals. Lipase elevated at 103.  CBC with leukocytosis at 13.5.  No anemia.  CMP reassuring.  UA without concern for infection. CT showing mildly increased pancreatitis from last episode with pancreatic pseudocyst. Provided patient with morphine and Zofran for symptom control. Also started on IV fluids.  Patient stating that his pain has resolved some, but he knows that he will need to be admitted for this episode because the pain is not fully under control and he has not been able to eat/sleep for a while d/t recurrent pancreatitis.  Dr. Margo Aye admitting provider. I have reviewed the patients home medicines and have made adjustments as needed   Ddx: These are considered less likely due to history of present illness and physical exam. -gastroenteritis: No vomiting in ED; no fever; tolerating PO intake -colitis: Denies diarrhea, patient afebrile  -small bowel obstruction: CT reassuring -appendicitis: Negative McBurney point, rebound tenderness, psoas, obturator sign  -cholecystitis: Negative Murphy sign; liver enzymes within normal limits  -nephrolithiasis: Denies flank pain and urinary  complaints  -UTI/pyelonephritis: Denies urinary complaints  - testicular torsion: pain does not radiate into testicles   Social Determinants of Health:  none          Final Clinical Impression(s) / ED Diagnoses Final diagnoses:  Other acute pancreatitis without infection or necrosis    Rx / DC Orders ED Discharge Orders     None         Margarita Rana 04/11/23 2207    Arby Barrette, MD 04/11/23 2353

## 2023-04-11 NOTE — ED Provider Triage Note (Signed)
Emergency Medicine Provider Triage Evaluation Note  [name] [name] [name] [name] Barley , a 32 y.o. male  was evaluated in triage.  Pt complains of acute onset abdominal pain that started this morning.  States this feels similar to previous pancreatitis flares. Endorses chills.  Denies any dysuria, hematuria.  Review of Systems  Positive: As above Negative: As above  Physical Exam  BP (!) 163/100 (BP Location: Right Arm)   Pulse 71   Temp 98.2 F (36.8 C) (Oral)   Resp 18   Ht 5\' 11"  (1.803 m)   Wt 74.8 kg   SpO2 100%   BMI 23.01 kg/m  Gen:   Awake, no distress, slightly uncomfortable appearing, holding stomach Resp:  Normal effort  MSK:   Moves extremities without difficulty    Medical Decision Making  Medically screening exam initiated at 4:46 PM.  Appropriate orders placed.  [name] [name] was informed that the remainder of the evaluation will be completed by another provider, this initial triage assessment does not replace that evaluation, and the importance of remaining in the ED until their evaluation is complete.     [name] Merles, [name] 04/11/23 [phone]

## 2023-04-11 NOTE — ED Notes (Addendum)
[facility] called for tranportation. RN called Presenter, broadcasting at [employer] for report.

## 2023-04-11 NOTE — ED Triage Notes (Signed)
Patient stated he has a pancreatitis flare up. Nauseous, weak feeling.

## 2023-04-11 NOTE — ED Notes (Signed)
ED TO INPATIENT HANDOFF REPORT  ED Nurse Name and Phone #: Julian Reil [phone]  S Name/Age/Gender [name] [name] Arney 32 y.o. male Room/Bed: WA15/WA15  Code Status   Code Status: Full Code  Home/SNF/Other Home Patient oriented to: self, place, time, and situation Is this baseline? Yes   Triage Complete: Triage complete  Chief Complaint Acute pancreatitis [K85.90]  Triage Note Patient stated he has a pancreatitis flare up. Nauseous, weak feeling.    Allergies Allergies  Allergen Reactions   Amoxicillin Hives    Level of Care/Admitting Diagnosis ED Disposition     ED Disposition  Admit   Condition  --   Comment  Hospital Area: [facility] [[id number]]  Level of Care: Telemetry [5]  Admit to tele based on following criteria: Monitor for Ischemic changes  May admit patient to [facility] or [facility] if equivalent level of care is available:: Yes  Covid Evaluation: Symptomatic Person Under Investigation (PUI) or recent exposure (last 10 days) *Testing Required*  Diagnosis: Acute pancreatitis [577.0.ICD-9-CM]  Admitting Physician: Darlin Drop [[id number]]  Attending Physician: Darlin Drop [[id number]]  Certification:: I certify this patient will need inpatient services for at least 2 midnights  Expected Medical Readiness: 04/13/2023          B Medical/Surgery History Past Medical History:  Diagnosis Date   Asthma    Headache    migraines   Heart murmur    Hypokalemia 06/08/2020   Leukocytosis 01/23/2022   Mild aortic stenosis    Pancreatitis    Pneumonia    as a child x several   Substance abuse [facility])    Past Surgical History:  Procedure Laterality Date   AORTIC VALVE REPLACEMENT N/A 09/27/2021   Procedure: AORTIC VALVE REPLACEMENT (AVR) USING ON-X AORTIC VALVE SIZE ;  Surgeon: Loreli Slot, MD;  Location: [facility] OR;  Service: Open Heart Surgery;  Laterality: N/A;   INGUINAL HERNIA REPAIR Right 01/05/2022    Procedure: OPEN REPAIR INCARCERATED RIGHT INGUINAL HERNIA WITH MESH;  Surgeon: [name] Adu, MD;  Location: [facility] ORS;  Service: General;  Laterality: Right;  90 MIN - ROOM 2   INGUINAL HERNIA REPAIR Left 12/20/2022   Procedure: OPEN REPAIR LEFT INGUINAL HERNIA WITH MESH;  Surgeon: [name] Adu, MD;  Location: [facility] ORS;  Service: General;  Laterality: Left;   REPLACEMENT ASCENDING AORTA N/A 09/27/2021   Procedure: REPLACEMENT ASCENDING AND INNOMINATE ANEURYSM USING HEMASHIELD PLATINUM [id number];  Surgeon: Loreli Slot, MD;  Location[facility] OR;  Service: Open Heart Surgery;  Laterality: N/A;   RIGHT HEART CATH AND CORONARY ANGIOGRAPHY N/A 09/07/2021   Procedure: RIGHT HEART CATH AND CORONARY ANGIOGRAPHY;  Surgeon: Orbie Pyo, MD;  Location: [facility] INVASIVE CV LAB;  Service: Cardiovascular;  Laterality: N/A;   TEE WITHOUT CARDIOVERSION N/A 09/27/2021   Procedure: TRANSESOPHAGEAL ECHOCARDIOGRAM (TEE);  Surgeon: Loreli Slot, MD;  Location: [facility] OR;  Service: Open Heart Surgery;  Laterality: N/A;     A IV Location/Drains/Wounds Patient Lines/Drains/Airways Status     Active Line/Drains/Airways     Name Placement date Placement time Site Days   Peripheral IV 04/11/23 20 G 1" Anterior;Distal;Left;Upper Arm 04/11/23  1912  Arm  less than 1            Intake/Output Last 24 hours No intake or output data in the 24 hours ending 04/11/23 2347  Labs/Imaging Results for orders placed or performed during the hospital encounter of 04/11/23 (from the past 48 hour(s))  CBC  with Differential     Status: Abnormal   Collection Time: 04/11/23  4:59 PM  Result Value Ref Range   WBC 13.5 (H) 4.0 - 10.5 K/uL   RBC 4.29 4.22 - 5.81 MIL/uL   Hemoglobin 13.0 13.0 - 17.0 g/dL   HCT 40.9 81.1 - 91.4 %   MCV 92.3 80.0 - 100.0 fL   MCH 30.3 26.0 - 34.0 pg   MCHC 32.8 30.0 - 36.0 g/dL   RDW 78.2 95.6 - 21.3 %   Platelets 418 (H) 150 - 400 K/uL   nRBC 0.0 0.0 - 0.2 %   Neutrophils Relative %  70 %   Neutro Abs 9.4 (H) 1.7 - 7.7 K/uL   Lymphocytes Relative 16 %   Lymphs Abs 2.2 0.7 - 4.0 K/uL   Monocytes Relative 8 %   Monocytes Absolute 1.1 (H) 0.1 - 1.0 K/uL   Eosinophils Relative 5 %   Eosinophils Absolute 0.7 (H) 0.0 - 0.5 K/uL   Basophils Relative 0 %   Basophils Absolute 0.0 0.0 - 0.1 K/uL   Immature Granulocytes 1 %   Abs Immature Granulocytes 0.07 0.00 - 0.07 K/uL    Comment: Performed at [facility], [address], [place], [place] [address]  Comprehensive metabolic panel     Status: Abnormal   Collection Time: 04/11/23  4:59 PM  Result Value Ref Range   Sodium 143 135 - 145 mmol/L   Potassium 3.7 3.5 - 5.1 mmol/L   Chloride 101 98 - 111 mmol/L   CO2 28 22 - 32 mmol/L   Glucose, Bld 113 (H) 70 - 99 mg/dL    Comment: Glucose reference range applies only to samples taken after fasting for at least 8 hours.   BUN 12 6 - 20 mg/dL   Creatinine, Ser 8.46 0.61 - 1.24 mg/dL   Calcium 9.7 8.9 - 96.2 mg/dL   Total Protein 8.0 6.5 - 8.1 g/dL   Albumin 4.6 3.5 - 5.0 g/dL   AST 29 15 - 41 U/L   ALT 38 0 - 44 U/L   Alkaline Phosphatase 68 38 - 126 U/L   Total Bilirubin 0.5 0.3 - 1.2 mg/dL   GFR, Estimated >95 >28 mL/min    Comment: (NOTE) Calculated using the CKD-EPI Creatinine Equation (2021)    Anion gap 14 5 - 15    Comment: Performed at [facility], [address], [place], [place] [address]  Lipase, blood     Status: Abnormal   Collection Time: 04/11/23  4:59 PM  Result Value Ref Range   Lipase 103 (H) 11 - 51 U/L    Comment: Performed at [facility], [address], [place], [place] [address]  Urinalysis, Routine w reflex microscopic -Urine, Clean Catch     Status: Abnormal   Collection Time: 04/11/23  5:49 PM  Result Value Ref Range   Color, Urine YELLOW YELLOW   APPearance CLEAR CLEAR   Specific Gravity, Urine 1.016 1.005 - 1.030   pH 7.0 5.0 - 8.0   Glucose, UA NEGATIVE NEGATIVE mg/dL   Hgb  urine dipstick MODERATE (A) NEGATIVE   Bilirubin Urine NEGATIVE NEGATIVE   Ketones, ur NEGATIVE NEGATIVE mg/dL   Protein, ur NEGATIVE NEGATIVE mg/dL   Nitrite NEGATIVE NEGATIVE   Leukocytes,Ua NEGATIVE NEGATIVE   RBC / HPF >50 0 - 5 RBC/hpf   WBC, UA 0-5 0 - 5 WBC/hpf   Bacteria, UA RARE (A) NONE SEEN   Squamous  Epithelial / HPF 0-5 0 - 5 /HPF   Mucus PRESENT     Comment: Performed at [facility], [address], [place], [place] [address]   CT ABDOMEN PELVIS W CONTRAST  Result Date: 04/11/2023 CLINICAL DATA:  Abdominal pain and back pain with chills, nausea and vomiting. EXAM: CT ABDOMEN AND PELVIS WITH CONTRAST TECHNIQUE: Multidetector CT imaging of the abdomen and pelvis was performed using the standard protocol following bolus administration of intravenous contrast. RADIATION DOSE REDUCTION: This exam was performed according to the departmental dose-optimization program which includes automated exposure control, adjustment of the mA and/or kV according to patient size and/or use of iterative reconstruction technique. CONTRAST:  OMNIPAQUE IOHEXOL 300 MG/ML  SOLN COMPARISON:  March 12, 2023 FINDINGS: Lower chest: No acute abnormality. Multiple sternal wires are noted. Hepatobiliary: There is diffuse fatty infiltration of the liver parenchyma. No focal liver abnormality is seen. No gallstones, gallbladder wall thickening, or biliary dilatation. Pancreas: There is mild to moderate severity peripancreatic inflammatory fat stranding. This is mildly increased in severity when compared to the prior exam. A 3.7 cm x 2.6 cm x 2.7 cm partially calcified area of low attenuation is again seen within the head of the pancreas. Pancreatic ductal dilatation is noted (approximately 9 mm in diameter within the region of the body of the pancreas). Spleen: No splenic injury or perisplenic hematoma. Adrenals/Urinary Tract: Adrenal glands are unremarkable. Kidneys are normal, without renal  calculi, focal lesion, or hydronephrosis. The urinary bladder is poorly distended and subsequently limited in evaluation. Stomach/Bowel: Stomach is within normal limits. Appendix appears normal. No evidence of bowel wall thickening, distention, or inflammatory changes. Vascular/Lymphatic: No significant vascular findings are present. No enlarged abdominal or pelvic lymph nodes. Reproductive: Prostate is unremarkable. Other: No abdominal wall hernia or abnormality. No abdominopelvic ascites. Musculoskeletal: No acute or significant osseous findings. IMPRESSION: 1. Acute pancreatitis, mildly increased in severity when compared to the prior exam. 2. 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 3. Hepatic steatosis. Electronically Signed   By: Aram Candela M.D.   On: 04/11/2023 21:14    Pending Labs Unresulted Labs (From admission, onward)     Start     Ordered   04/12/23 0500  CBC  Tomorrow morning,   R        04/11/23 2209   04/12/23 0500  Comprehensive metabolic panel  Tomorrow morning,   R        04/11/23 2209   04/12/23 0500  Magnesium  Tomorrow morning,   R        04/11/23 2209   04/12/23 0500  Phosphorus  Tomorrow morning,   R        04/11/23 2209   04/11/23 2208  Protime-INR  Daily,   R      04/11/23 2207            Vitals/Pain Today's Vitals   04/11/23 1944 04/11/23 2100 04/11/23 2332 04/11/23 2345  BP:  (!) 167/94    Pulse:  65    Resp:  16    Temp:   97.8 F (36.6 C)   TempSrc:   Oral   SpO2:  98%    Weight:      Height:      PainSc: 5    6     Isolation Precautions No active isolations  Medications Medications  oxyCODONE (Oxy IR/ROXICODONE) immediate release tablet 5 mg (has no  administration in time range)  HYDROmorphone (DILAUDID) injection 0.5 mg (has no administration in time range)  prochlorperazine (COMPAZINE) injection 5 mg (has no administration in time range)   polyethylene glycol (MIRALAX / GLYCOLAX) packet 17 g (has no administration in time range)  melatonin tablet 5 mg (has no administration in time range)  0.9 %  sodium chloride infusion ( Intravenous New Bag/Given 04/11/23 2244)  aspirin EC tablet 81 mg (81 mg Oral Not Given 04/11/23 2344)  fentaNYL (SUBLIMAZE) injection 50 mcg (50 mcg Intravenous Given 04/11/23 1917)  iohexol (OMNIPAQUE) 300 MG/ML solution 100 mL (100 mLs Intravenous Contrast Given 04/11/23 1929)  morphine (PF) 4 MG/ML injection 4 mg (4 mg Intravenous Given 04/11/23 2047)  ondansetron (ZOFRAN) injection 4 mg (4 mg Intravenous Given 04/11/23 2047)  sodium chloride 0.9 % bolus 1,000 mL (1,000 mLs Intravenous Bolus 04/11/23 2142)    Mobility walks     Focused Assessments    R Recommendations: See Admitting Provider Note  Report given to:   Additional Notes:

## 2023-04-11 NOTE — ED Notes (Signed)
Patient is being discharged from the Urgent Care and sent to the Emergency Department via POV . Per Urban Gibson, [name], patient is in need of higher level of care due to abd/back, hx pancreatitis. Patient is aware and verbalizes understanding of plan of care.  Vitals:   04/11/23 1551  BP: (!) 174/108  Pulse: 75  Resp: 16  Temp: 97.9 F (36.6 C)  SpO2: 98%

## 2023-04-12 DIAGNOSIS — F1011 Alcohol abuse, in remission: Secondary | ICD-10-CM | POA: Diagnosis not present

## 2023-04-12 DIAGNOSIS — K863 Pseudocyst of pancreas: Secondary | ICD-10-CM | POA: Diagnosis not present

## 2023-04-12 DIAGNOSIS — K861 Other chronic pancreatitis: Secondary | ICD-10-CM | POA: Diagnosis not present

## 2023-04-12 DIAGNOSIS — K859 Acute pancreatitis without necrosis or infection, unspecified: Secondary | ICD-10-CM

## 2023-04-12 LAB — COMPREHENSIVE METABOLIC PANEL
ALT: 30 U/L (ref 0–44)
AST: 21 U/L (ref 15–41)
Albumin: 3.6 g/dL (ref 3.5–5.0)
Alkaline Phosphatase: 60 U/L (ref 38–126)
Anion gap: 8 (ref 5–15)
BUN: 5 mg/dL — ABNORMAL LOW (ref 6–20)
CO2: 26 mmol/L (ref 22–32)
Calcium: 8.8 mg/dL — ABNORMAL LOW (ref 8.9–10.3)
Chloride: 106 mmol/L (ref 98–111)
Creatinine, Ser: 0.55 mg/dL — ABNORMAL LOW (ref 0.61–1.24)
GFR, Estimated: >60 mL/min (ref >60)
Glucose, Bld: 112 mg/dL — ABNORMAL HIGH (ref 70–99)
Potassium: 3.6 mmol/L (ref 3.5–5.1)
Sodium: 140 mmol/L (ref 135–145)
Total Bilirubin: 0.5 mg/dL (ref 0.3–1.2)
Total Protein: 6.3 g/dL — ABNORMAL LOW (ref 6.5–8.1)

## 2023-04-12 LAB — CBC
HCT: 35.5 % — ABNORMAL LOW (ref 39.0–52.0)
Hemoglobin: 11.8 g/dL — ABNORMAL LOW (ref 13.0–17.0)
MCH: 30.3 pg (ref 26.0–34.0)
MCHC: 33.2 g/dL (ref 30.0–36.0)
MCV: 91 fL (ref 80.0–100.0)
Platelets: 324 10*3/uL (ref 150–400)
RBC: 3.9 MIL/uL — ABNORMAL LOW (ref 4.22–5.81)
RDW: 12.9 % (ref 11.5–15.5)
WBC: 9.1 10*3/uL (ref 4.0–10.5)
nRBC: 0 % (ref 0.0–0.2)

## 2023-04-12 LAB — PHOSPHORUS: Phosphorus: 3.7 mg/dL (ref 2.5–4.6)

## 2023-04-12 LAB — PROTIME-INR
INR: 2 — ABNORMAL HIGH (ref 0.8–1.2)
INR: 2.1 — ABNORMAL HIGH (ref 0.8–1.2)
Prothrombin Time: 23 s — ABNORMAL HIGH (ref 11.4–15.2)
Prothrombin Time: 23.8 s — ABNORMAL HIGH (ref 11.4–15.2)

## 2023-04-12 LAB — MAGNESIUM: Magnesium: 2 mg/dL (ref 1.7–2.4)

## 2023-04-12 LAB — C-REACTIVE PROTEIN: CRP: 1.2 mg/dL — ABNORMAL HIGH (ref <1.0)

## 2023-04-12 MED ORDER — WARFARIN - PHARMACIST DOSING INPATIENT: Freq: Every day | Status: DC

## 2023-04-12 MED ORDER — WARFARIN SODIUM 6 MG PO TABS
6.0000 mg | ORAL_TABLET | Freq: Once | ORAL | Status: AC
  Administered 2023-04-12: 6 mg via ORAL
  Filled 2023-04-12: qty 1

## 2023-04-12 MED ORDER — INFLUENZA VIRUS VACC SPLIT PF (FLUZONE) 0.5 ML IM SUSY
0.5000 mL | PREFILLED_SYRINGE | INTRAMUSCULAR | Status: AC
  Administered 2023-04-13: 0.5 mL via INTRAMUSCULAR
  Filled 2023-04-12: qty 0.5

## 2023-04-12 MED ORDER — METOPROLOL SUCCINATE ER 25 MG PO TB24
12.5000 mg | ORAL_TABLET | Freq: Every day | ORAL | Status: DC
  Administered 2023-04-12 (×2): 12.5 mg via ORAL
  Filled 2023-04-12 (×2): qty 1

## 2023-04-12 MED ORDER — BOOST / RESOURCE BREEZE PO LIQD CUSTOM
1.0000 | Freq: Three times a day (TID) | ORAL | Status: DC
  Administered 2023-04-12 – 2023-04-13 (×4): 1 via ORAL

## 2023-04-12 NOTE — Plan of Care (Signed)
  Problem: Clinical Measurements: Goal: Ability to maintain clinical measurements within normal limits will improve Outcome: Progressing Goal: Will remain free from infection Outcome: Progressing Goal: Respiratory complications will improve Outcome: Progressing   Problem: Activity: Goal: Risk for activity intolerance will decrease Outcome: Progressing   Problem: Nutrition: Goal: Adequate nutrition will be maintained Outcome: Progressing   Problem: Pain Management: Goal: General experience of comfort will improve Outcome: Progressing   Problem: Safety: Goal: Ability to remain free from injury will improve Outcome: Progressing   Problem: Skin Integrity: Goal: Risk for impaired skin integrity will decrease Outcome: Progressing

## 2023-04-12 NOTE — Progress Notes (Signed)
PHARMACY - ANTICOAGULATION CONSULT NOTE  Pharmacy Consult for warfarin Indication: mechanical valve  Allergies  Allergen Reactions   Amoxicillin Hives    Patient Measurements: Height: 5\' 11"  (180.3 cm) Weight: 74.8 kg (165 lb) IBW/kg (Calculated) : 75.3 Heparin Dosing Weight:   Vital Signs: Temp: 98.3 F (36.8 C) (11/01 0830) Temp Source: Oral (11/01 0830) BP: 174/102 (11/01 0830) Pulse Rate: 66 (11/01 0830)  Labs: Recent Labs    04/11/23 1659 04/12/23 0003 04/12/23 0731  HGB 13.0  --  11.8*  HCT 39.6  --  35.5*  PLT 418*  --  324  LABPROT  --  23.8* 23.0*  INR  --  2.1* 2.0*  CREATININE 0.83  --  0.55*    Estimated Creatinine Clearance: 140.3 mL/min (A) (by C-G formula based on SCr of 0.55 mg/dL (L)).   Medical History: Past Medical History:  Diagnosis Date   Asthma    Headache    migraines   Heart murmur    Hypokalemia 06/08/2020   Leukocytosis 01/23/2022   Mild aortic stenosis    Pancreatitis    Pneumonia    as a child x several   Substance abuse (HCC)     Medications:  Home warfarin dose 6mg  on M,W,F and 4.5mg  all other days  Assessment: 32 y.o. male with PMHx recurrent pancreatitis, asthma, headache, substance use disorder who presents to ED concerned for LUQ abdominal pain, nausea, fatigue. Pharmacy to dose warfarin   INR 2 this AM   Goal of Therapy:  INR 1.5-2 OF NOTE: patient underwent replacement of aortic valve in April 2023 using a newer generation ON-X valve. Following results of the PROACT trial, the valve received FDA approval with an INR goal of 1.5-2.0 when used in the aortic position.    Plan:  Warfarin 6 mg po x 1 dose today Daily INR and dose accordingly  Thank you Okey Regal, PharmD 04/12/2023, 10:51 AM

## 2023-04-12 NOTE — Consult Note (Addendum)
Consultation  Referring Provider: No ref. provider found Primary Care Physician:  [name], NP Primary Gastroenterologist:  unassigned-appt schedule with [place] 12/24  Reason for Consultation: Recurrent/relapsing pancreatitis  HPI: [name] [name] is a 32 y.o. male, who is status post aortic valve replacement and repair of ascending aorta April 2023, on chronic Coumadin who also has history of asthma, and substance abuse. He was admitted through the emergency room yesterday after he returned with complaints of constant and progressive abdominal pain over the past 4 to 5 days.  He had had some episodes of vomiting at home and really was not keeping much down p.o.  He had just had a very brief hospitalization 10/22 through 04/03/2023 with a flare of acute pancreatitis.  He says he felt better when he was released and was able to eat fairly normally for a couple of days then the pain worsened and progressed as above. He also had ER visit in early October with acute pancreatitis.  Referral was made to GI at that time, and decision was made not to admit him. 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  There were also calcifications noted consistent with chronic pancreatitis.  Patient relates his initial episode of pancreatitis was in 2017 and he was hospitalized at that time elsewhere, and definitely feels that this episode was alcohol related. He then had the admission in October 2023 again with acute pancreatitis.  He relates history of heavy alcohol use at least 40 drinks per week, and sometimes more over a period of 4 to 5 years around the time of that 2017 admission. He was admitted in 2023 he was not drinking as heavily on a regular basis but had 3 drinks the day prior to onset of that episode.   the past couple of months  he says he has been good and has been drinking significantly less maybe 1 day/week  5 or yesterday repeat CT imaging showed hepatic steatosis, mild to moderate peripancreatic inflammatory changes and stranding, there is a 3.7 x 2.6 x 3.7 cm partially calcified pancreatic head cystic lesion consistent with a pseudocyst, the pancreatic duct is dilated to 9 mm in the pancreatic body.  Labs on 04/11/2023 showed WBC of 13.5/hemoglobin 13/hematocrit 39.6 Potassium 3.7/BUN 12/creatinine 0.83 LFTs within normal limits Lipase 103 INR 2.1. Today WBC down to 9.1/hemoglobin 11.8 BUN 5/creatinine 0.55 LFTs normal.  This is current pain level is a 4-5 out of 10 and that the pain medication definitely helps but when this wears off he gets back up to a 6 or 7.  Again he is not having any nausea or vomiting, no fever or chills. Starting on clear liquids today. Says he is down about 20 pounds over the past 4 months     Past Medical History:  Diagnosis Date   Asthma    Headache    migraines   Heart murmur    Hypokalemia 06/08/2020   Leukocytosis 01/23/2022   Mild aortic stenosis    Pancreatitis    Pneumonia    as a child x several   Substance abuse [facility])     Past Surgical History:  Procedure Laterality Date   AORTIC VALVE REPLACEMENT N/A 09/27/2021   Procedure: AORTIC VALVE REPLACEMENT (AVR) USING ON-X AORTIC VALVE SIZE ;  Surgeon: Loreli Slot, MD;  Location: [facility] OR;  Service: Open  Heart Surgery;  Laterality: N/A;   INGUINAL HERNIA REPAIR Right 01/05/2022   Procedure: OPEN REPAIR INCARCERATED RIGHT INGUINAL HERNIA WITH MESH;  Surgeon: Gaynelle Adu, MD;  Location: [facility] ORS;  Service: General;  Laterality: Right;  90 MIN - ROOM 2   INGUINAL HERNIA REPAIR Left 12/20/2022   Procedure: OPEN REPAIR LEFT INGUINAL HERNIA WITH MESH;  Surgeon: Gaynelle Adu, MD;  Location: [facility] ORS;  Service: General;  Laterality: Left;   REPLACEMENT ASCENDING AORTA N/A 09/27/2021   Procedure: REPLACEMENT ASCENDING  AND INNOMINATE ANEURYSM USING HEMASHIELD PLATINUM [id number];  Surgeon: Loreli Slot, MD;  Location: [facility] OR;  Service: Open Heart Surgery;  Laterality: N/A;   RIGHT HEART CATH AND CORONARY ANGIOGRAPHY N/A 09/07/2021   Procedure: RIGHT HEART CATH AND CORONARY ANGIOGRAPHY;  Surgeon: Orbie Pyo, MD;  Location: [facility] INVASIVE CV LAB;  Service: Cardiovascular;  Laterality: N/A;   TEE WITHOUT CARDIOVERSION N/A 09/27/2021   Procedure: TRANSESOPHAGEAL ECHOCARDIOGRAM (TEE);  Surgeon: Loreli Slot, MD;  Location: [facility] OR;  Service: Open Heart Surgery;  Laterality: N/A;    Prior to Admission medications   Medication Sig Start Date End Date Taking? Authorizing Provider  acetaminophen (TYLENOL) 325 MG tablet Take 2 tablets (650 mg total) by mouth every 6 (six) hours as needed for up to 30 doses for mild pain or moderate pain. 03/12/23  Yes Terald Sleeper, MD  aspirin EC 81 MG EC tablet Take 1 tablet (81 mg total) by mouth daily. Swallow whole. 10/02/21  Yes Barrett, Erin R, [name]  loratadine (CLARITIN) 10 MG tablet Take 10 mg by mouth daily.   Yes [provider]  metoprolol succinate (TOPROL XL) 25 MG 24 hr tablet Take 0.5 tablets (12.5 mg total) by mouth at bedtime. 11/01/22  Yes Conte, Tessa N, [name]  pantoprazole (PROTONIX) 20 MG tablet Take 1 tablet (20 mg total) by mouth daily. Take 1 pill twice a day for 2 weeks, then 1 pill every morning. 03/05/23  Yes [name], NP  warfarin (COUMADIN) 3 MG tablet TAKE 1 AND 1/2 TABLETS BY MOUTH DAILY. EXCEPT TAKE 2 TABLETS ON MONDAYS, WEDNESDAYS AND FRIDAYS OR AS DIRECTED BY COAGULATION CLINIC Patient taking differently: Take 3 mg by mouth See admin instructions. TAKE 1 AND 1/2 TABLETS BY MOUTH DAILY. EXCEPT TAKE 2 TABLETS ON MONDAYS, WEDNESDAYS AND FRIDAYS OR AS DIRECTED BY COAGULATION CLINIC 04/01/23  Yes Orbie Pyo, MD  METAMUCIL FIBER PO Take 10 mLs by mouth every other day. Patient not taking: Reported on 04/11/2023     [provider]  oxyCODONE (ROXICODONE) 5 MG immediate release tablet Take 1 tablet (5 mg total) by mouth every 6 (six) hours as needed for up to 15 doses for severe pain (pain score 7-10) or breakthrough pain. Patient not taking: Reported on 04/11/2023 04/03/23   Lewie Chamber, MD    Current Facility-Administered Medications  Medication Dose Route Frequency Provider Last Rate Last Admin   0.9 %  sodium chloride infusion   Intravenous Continuous Darlin Drop, DO 100 mL/hr at 04/12/23 1035 New Bag at 04/12/23 1035   aspirin EC tablet 81 mg  81 mg Oral Daily Darlin Drop, DO   81 mg at 04/12/23 1036   feeding supplement (BOOST / RESOURCE BREEZE) liquid 1 Container  1 Container Oral TID BM Dow Adolph N, DO   1 Container at 04/12/23 1031   HYDROmorphone (DILAUDID) injection 0.5 mg  0.5 mg Intravenous Q4H PRN Dow Adolph N, DO   0.5 mg  at 04/12/23 0519   [START ON 04/13/2023] influenza vac split trivalent PF (FLULAVAL) injection 0.5 mL  0.5 mL Intramuscular Tomorrow-1000 Hall, Carole N, DO       melatonin tablet 5 mg  5 mg Oral QHS PRN Dow Adolph N, DO       metoprolol succinate (TOPROL-XL) 24 hr tablet 12.5 mg  12.5 mg Oral QHS Hall, Carole N, DO   12.5 mg at 04/12/23 8295   oxyCODONE (Oxy IR/ROXICODONE) immediate release tablet 5 mg  5 mg Oral Q6H PRN Dow Adolph N, DO   5 mg at 04/12/23 0859   polyethylene glycol (MIRALAX / GLYCOLAX) packet 17 g  17 g Oral Daily PRN Dow Adolph N, DO       prochlorperazine (COMPAZINE) injection 5 mg  5 mg Intravenous Q6H PRN Dow Adolph N, DO   5 mg at 04/12/23 6213   warfarin (COUMADIN) tablet 6 mg  6 mg Oral ONCE-1600 Joaquim Nam, [facility]       Warfarin - Pharmacist Dosing Inpatient   Does not apply [id number] Joaquim Nam, [facility]        Allergies as of 04/11/2023 - Review Complete 04/11/2023  Allergen Reaction Noted   Amoxicillin Hives 11/01/2016    Family History  Problem Relation Age of Onset   Pancreatitis Maternal Uncle     Social  History   Socioeconomic History   Marital status: Single    Spouse name: Not on file   Number of children: Not on file   Years of education: Not on file   Highest education level: Not on file  Occupational History   Occupation: Bartender    Comment: [employer]  Tobacco Use   Smoking status: Never   Smokeless tobacco: Never  Vaping Use   Vaping status: Never Used  Substance and Sexual Activity   Alcohol use: Yes    Comment: none x 1 month as od 04/11/2023   Drug use: Not Currently    Types: Other-see comments    Comment: Edibles occasionally marijuana   Sexual activity: Yes  Other Topics Concern   Not on file  Social History Narrative   Not on file   Social Determinants of Health   Financial Resource Strain: Not on file  Food Insecurity: No Food Insecurity (04/12/2023)   Hunger Vital Sign    Worried About Running Out of Food in the Last Year: Never true    Ran Out of Food in the Last Year: Never true  Transportation Needs: No Transportation Needs (04/12/2023)   PRAPARE - Administrator, Civil Service (Medical): No    Lack of Transportation (Non-Medical): No  Physical Activity: Not on file  Stress: Not on file  Social Connections: Unknown (10/10/2021)   Received from [facility]   Social Network    Social Network: Not on file  Intimate Partner Violence: Not At Risk (04/12/2023)   Humiliation, Afraid, Rape, and Kick questionnaire    Fear of Current or Ex-Partner: No    Emotionally Abused: No    Physically Abused: No    Sexually Abused: No    Review of Systems: Pertinent positive and negative review of systems were noted in the above HPI section.  All other review of systems was otherwise negative.   Physical Exam: Vital signs in last 24 hours: Temp:  [97.6 F (36.4 C)-98.6 F (37 C)] 98.3 F (36.8 C) (11/01 0830) Pulse Rate:  [61-75] 66 (11/01 0830) Resp:  [16-18]  18 (11/01 0830) BP: (136-174)/(91-108) 174/102 (11/01  0830) SpO2:  [97 %-100 %] 98 % (11/01 0830) Weight:  [74.8 kg] 74.8 kg (10/31 1631) Last BM Date : 04/12/23 General:   Alert,  Well-developed, young white male, pleasant and cooperative in NAD Head:  Normocephalic and atraumatic. Eyes:  Sclera clear, no icterus.   Conjunctiva pink. Ears:  Normal auditory acuity. Nose:  No deformity, discharge,  or lesions. Mouth:  No deformity or lesions.   Neck:  Supple; no masses or thyromegaly. Lungs:  Clear throughout to auscultation.   No wheezes, crackles, or rhonchi.  Heart:  Regular rate and rhythm; mechanical valve click Abdomen:  Soft, he is tender in the epigastrium/hypogastrium, no guarding or rebound, BS active,nonpalp mass or hsm.   Rectal: Not done Msk:  Symmetrical without gross deformities. . Pulses:  Normal pulses noted. Extremities:  Without clubbing or edema. Neurologic:  Alert and  oriented x4;  grossly normal neurologically. Skin:  Intact without significant lesions or rashes.. Psych:  Alert and cooperative. Normal mood and affect.  Intake/Output from previous day: 10/31 0701 - 11/01 0700 In: 120 [P.O.:120] Out: 300 [Urine:300] Intake/Output this shift: Total I/O In: -  Out: 500 [Urine:500]  Lab Results: Recent Labs    04/11/23 1659 04/12/23 0731  WBC 13.5* 9.1  HGB 13.0 11.8*  HCT 39.6 35.5*  PLT 418* 324   BMET Recent Labs    04/11/23 1659 04/12/23 0731  NA 143 140  K 3.7 3.6  CL 101 106  CO2 28 26  GLUCOSE 113* 112*  BUN 12 5*  CREATININE 0.83 0.55*  CALCIUM 9.7 8.8*   LFT Recent Labs    04/12/23 0731  PROT 6.3*  ALBUMIN 3.6  AST 21  ALT 30  ALKPHOS 60  BILITOT 0.5   PT/INR Recent Labs    04/12/23 0003 04/12/23 0731  LABPROT 23.8* 23.0*  INR 2.1* 2.0*   Hepatitis Panel No results for input(s): "HEPBSAG", "HCVAB", "HEPAIGM", "HEPBIGM" in the last 72 hours.   IMPRESSION:  #68 32 year old white male with history of alcohol use disorder/alcoholism with initial episode of pancreatitis  2017. Admission 2023 with acute pancreatitis Admission/brief last week with acute pancreatitis and imaging at that time more consistent with a component of chronic calcific pancreatitis he has also developed a 2.2 cm pancreatic head pseudocyst.  Readmitted now with exacerbation of pain, poor appetite and intermittent vomiting and repeat CT imaging again showing mild to moderate acute pancreatitis and an enlarging pancreatic head/partially calcified pseudocyst now 3.7 x 2.6 x 3.7 cm.  I think his pancreatitis is clearly alcohol related and unfortunately he has now developed chronic calcific pancreatitis  Current episode fortunately appears mild to moderate with no parameters concerning for severe pancreatitis and clinically he looks well  #2 weight loss secondary to above #3 chronic anticoagulation-Coumadin #4 status post aortic valve replacement and repair of ascending aortic aneurysm April 2023 #5 asthma  PLAN:agree with clear liquids today, and tolerates without increase in pain or associated nausea and vomiting then can advance to solid diet low-fat tomorrow-protein supplements 3 times daily between meals Continue IV fluids today currently at 100 cc /hr Pain control We discussed that it is imperative that he completely stop drinking alcohol now and forever moving forward Trend CRP He will need repeat imaging, likely as an outpatient when he follows up at our office in December, as the pancreatic head pseudocyst will need to be followed.  Hopefully this will stabilize and gradually resolve over the  upcoming months.     Ever Halberg [name] 04/12/2023, 12:04 PM

## 2023-04-12 NOTE — H&P (Addendum)
History and Physical  [name] [name] VOZ:[id number] DOB: [birth year] DOA: 04/11/2023  Referring physician: Valrie Hart- PA-EDP  PCP: [name], NP  Outpatient Specialists: Cardiology Patient coming from: Home  Chief Complaint: Severe upper abdominal pain rating to his back  HPI: [name] [name] is a 32 y.o. male with medical history significant for asthma, GERD on PPI, hypertension, bicuspid aortic valve with severe aortic insufficiency and moderate aortic stenosis status post mechanical aortic valve replacement and aortoplasty on 09/27/2021 with goal INR between 1.5-2 on Coumadin and aspirin indefinitely, asthma, recurrent multiple episodes of acute pancreatitis since 2021, recently admitted for acute pancreatitis on 04/02/2023 and discharged on 04/03/2023, who presents from home with complaints of severe upper abdominal pain radiating to his back.  The pain interferes with his activities, appetite, and oral intake.  Endorses unintentional weight loss at least 20 pounds since July 2024.  Denies recent use of alcohol for least a month.  Denies use of tobacco.  States he has never been evaluated by GI and has an upcoming appointment in December 2024 with [facility].  In the ED, uncomfortable due to abdominal pain.  Required several rounds of IV opiate based analgesics.  Also received 1 L IV fluid bolus NS x 1.  CT abdomen pelvis revealed findings suggestive of acute pancreatitis.  EDP requesting admission for further management.  ED Course: Temperature 98.6.  BP 160/97, pulse 61, respiratory 18, saturation 100% on room air.  Lab studies notable for INR 2.1, lipase 103.  WBC 13.5, neutrophil count 9.4.  Review of Systems: Review of systems as noted in the HPI. All other systems reviewed and are negative.   Past Medical History:  Diagnosis Date   Asthma    Headache    migraines   Heart murmur    Hypokalemia 06/08/2020   Leukocytosis 01/23/2022    Mild aortic stenosis    Pancreatitis    Pneumonia    as a child x several   Substance abuse [facility])    Past Surgical History:  Procedure Laterality Date   AORTIC VALVE REPLACEMENT N/A 09/27/2021   Procedure: AORTIC VALVE REPLACEMENT (AVR) USING ON-X AORTIC VALVE SIZE ;  Surgeon: Loreli Slot, MD;  Location: [facility] OR;  Service: Open Heart Surgery;  Laterality: N/A;   INGUINAL HERNIA REPAIR Right 01/05/2022   Procedure: OPEN REPAIR INCARCERATED RIGHT INGUINAL HERNIA WITH MESH;  Surgeon: Gaynelle Adu, MD;  Location: [facility] ORS;  Service: General;  Laterality: Right;  90 MIN - ROOM 2   INGUINAL HERNIA REPAIR Left 12/20/2022   Procedure: OPEN REPAIR LEFT INGUINAL HERNIA WITH MESH;  Surgeon: Gaynelle Adu, MD;  Location: [facility] ORS;  Service: General;  Laterality: Left;   REPLACEMENT ASCENDING AORTA N/A 09/27/2021   Procedure: REPLACEMENT ASCENDING AND INNOMINATE ANEURYSM USING HEMASHIELD PLATINUM [id number];  Surgeon: Loreli Slot, MD;  Location: [facility] OR;  Service: Open Heart Surgery;  Laterality: N/A;   RIGHT HEART CATH AND CORONARY ANGIOGRAPHY N/A 09/07/2021   Procedure: RIGHT HEART CATH AND CORONARY ANGIOGRAPHY;  Surgeon: Orbie Pyo, MD;  Location: [facility] INVASIVE CV LAB;  Service: Cardiovascular;  Laterality: N/A;   TEE WITHOUT CARDIOVERSION N/A 09/27/2021   Procedure: TRANSESOPHAGEAL ECHOCARDIOGRAM (TEE);  Surgeon: Loreli Slot, MD;  Location: [facility] OR;  Service: Open Heart Surgery;  Laterality: N/A;    Social History:  reports that he has never smoked. He has never used smokeless tobacco. He reports current alcohol use. He reports that he does not currently use drugs  after having used the following drugs: Other-see comments.   Allergies  Allergen Reactions   Amoxicillin Hives    Family History  Problem Relation Age of Onset   Pancreatitis Maternal Uncle       Prior to Admission medications   Medication Sig Start Date End Date Taking? Authorizing Provider   acetaminophen (TYLENOL) 325 MG tablet Take 2 tablets (650 mg total) by mouth every 6 (six) hours as needed for up to 30 doses for mild pain or moderate pain. 03/12/23  Yes Terald Sleeper, MD  aspirin EC 81 MG EC tablet Take 1 tablet (81 mg total) by mouth daily. Swallow whole. 10/02/21  Yes Barrett, Erin R, [name]  loratadine (CLARITIN) 10 MG tablet Take 10 mg by mouth daily.   Yes [provider]  metoprolol succinate (TOPROL XL) 25 MG 24 hr tablet Take 0.5 tablets (12.5 mg total) by mouth at bedtime. 11/01/22  Yes Conte, Tessa N, [name]  pantoprazole (PROTONIX) 20 MG tablet Take 1 tablet (20 mg total) by mouth daily. Take 1 pill twice a day for 2 weeks, then 1 pill every morning. 03/05/23  Yes [name], NP  warfarin (COUMADIN) 3 MG tablet TAKE 1 AND 1/2 TABLETS BY MOUTH DAILY. EXCEPT TAKE 2 TABLETS ON MONDAYS, WEDNESDAYS AND FRIDAYS OR AS DIRECTED BY COAGULATION CLINIC Patient taking differently: Take 3 mg by mouth See admin instructions. TAKE 1 AND 1/2 TABLETS BY MOUTH DAILY. EXCEPT TAKE 2 TABLETS ON MONDAYS, WEDNESDAYS AND FRIDAYS OR AS DIRECTED BY COAGULATION CLINIC 04/01/23  Yes Orbie Pyo, MD  METAMUCIL FIBER PO Take 10 mLs by mouth every other day. Patient not taking: Reported on 04/11/2023    [provider]  oxyCODONE (ROXICODONE) 5 MG immediate release tablet Take 1 tablet (5 mg total) by mouth every 6 (six) hours as needed for up to 15 doses for severe pain (pain score 7-10) or breakthrough pain. Patient not taking: Reported on 04/11/2023 04/03/23   Lewie Chamber, MD    Physical Exam: BP (!) 168/97 (BP Location: Right Arm)   Pulse 61   Temp 98.6 F (37 C) (Oral)   Resp 18   Ht 5\' 11"  (1.803 m)   Wt 74.8 kg   SpO2 100%   BMI 23.01 kg/m   General: 32 y.o. year-old male well developed well nourished in no acute distress.  Alert and oriented x3. Cardiovascular: Regular rate and rhythm with no rubs or gallops.  No thyromegaly or JVD noted.  No lower  extremity edema. 2/4 pulses in all 4 extremities. Respiratory: Clear to auscultation with no wheezes or rales. Good inspiratory effort. Abdomen: Soft upper abdomen tenderness, nondistended with normal bowel sounds x4 quadrants. Muskuloskeletal: No cyanosis, clubbing or edema noted bilaterally Neuro: CN II-XII intact, strength, sensation, reflexes Skin: No ulcerative lesions noted or rashes Psychiatry: Judgement and insight appear normal. Mood is appropriate for condition and setting          Labs on Admission:  Basic Metabolic Panel: Recent Labs  Lab 04/11/23 1659  NA 143  K 3.7  CL 101  CO2 28  GLUCOSE 113*  BUN 12  CREATININE 0.83  CALCIUM 9.7   Liver Function Tests: Recent Labs  Lab 04/11/23 1659  AST 29  ALT 38  ALKPHOS 68  BILITOT 0.5  PROT 8.0  ALBUMIN 4.6   Recent Labs  Lab 04/11/23 1659  LIPASE 103*   No results for input(s): "AMMONIA" in the last 168 hours. CBC: Recent Labs  Lab  04/11/23 1659  WBC 13.5*  NEUTROABS 9.4*  HGB 13.0  HCT 39.6  MCV 92.3  PLT 418*   Cardiac Enzymes: No results for input(s): "CKTOTAL", "CKMB", "CKMBINDEX", "TROPONINI" in the last 168 hours.  BNP (last 3 results) No results for input(s): "BNP" in the last 8760 hours.  ProBNP (last 3 results) No results for input(s): "PROBNP" in the last 8760 hours.  CBG: No results for input(s): "GLUCAP" in the last 168 hours.  Radiological Exams on Admission: CT ABDOMEN PELVIS W CONTRAST  Result Date: 04/11/2023 CLINICAL DATA:  Abdominal pain and back pain with chills, nausea and vomiting. EXAM: CT ABDOMEN AND PELVIS WITH CONTRAST TECHNIQUE: Multidetector CT imaging of the abdomen and pelvis was performed using the standard protocol following bolus administration of intravenous contrast. RADIATION DOSE REDUCTION: This exam was performed according to the departmental dose-optimization program which includes automated exposure control, adjustment of the mA and/or kV according to  patient size and/or use of iterative reconstruction technique. CONTRAST:  OMNIPAQUE IOHEXOL 300 MG/ML  SOLN COMPARISON:  March 12, 2023 FINDINGS: Lower chest: No acute abnormality. Multiple sternal wires are noted. Hepatobiliary: There is diffuse fatty infiltration of the liver parenchyma. No focal liver abnormality is seen. No gallstones, gallbladder wall thickening, or biliary dilatation. Pancreas: There is mild to moderate severity peripancreatic inflammatory fat stranding. This is mildly increased in severity when compared to the prior exam. A 3.7 cm x 2.6 cm x 2.7 cm partially calcified area of low attenuation is again seen within the head of the pancreas. Pancreatic ductal dilatation is noted (approximately 9 mm in diameter within the region of the body of the pancreas). Spleen: No splenic injury or perisplenic hematoma. Adrenals/Urinary Tract: Adrenal glands are unremarkable. Kidneys are normal, without renal calculi, focal lesion, or hydronephrosis. The urinary bladder is poorly distended and subsequently limited in evaluation. Stomach/Bowel: Stomach is within normal limits. Appendix appears normal. No evidence of bowel wall thickening, distention, or inflammatory changes. Vascular/Lymphatic: No significant vascular findings are present. No enlarged abdominal or pelvic lymph nodes. Reproductive: Prostate is unremarkable. Other: No abdominal wall hernia or abnormality. No abdominopelvic ascites. Musculoskeletal: No acute or significant osseous findings. IMPRESSION: 1. Acute pancreatitis, mildly increased in severity when compared to the prior exam. 2. 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 3. Hepatic steatosis. Electronically Signed   By: Aram Candela M.D.   On: 04/11/2023 21:14    EKG: I independently viewed the EKG done and my findings are as followed: None available at the time of this  visit.  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  No use of tobacco. No gallstones seen on CT scan. Home PPI held due to possible side effect of pancreatitis. IV fluid IV analgesics IV antiemetics [facility] consulted, appreciate assistance.  Leukocytosis, suspect reactive UA negative for pyuria. No upper respiratory symptoms Repeat CBC in the morning  History of bicuspid aortic valve with severe aortic insufficiency and moderate aortic stenosis status post mechanical aortic valve replacement and aortoplasty on 09/27/2021 INR 2.1 Resume home Coumadin and aspirin as recommended by patient's outpatient cardiothoracic surgery team Monitor on telemetry Goal INR between 1.5 and 2.0 while still taking aspirin 81 mg daily, per CTS and cardiology team.  Asthma No acute  issues As needed bronchodilators  GERD Hold off home PPI due to possible side effect of pancreatitis. Could consider H2 blocker, deferred to GI.  Hypertension Resume home Toprol-XL Monitor vital signs   Time: 75 minutes.    DVT prophylaxis: Home Coumadin  Code Status: Full code  Family Communication: Patient's aunt at bedside  Disposition Plan: Admitted to telemetry surgical unit.  Consults called: GI.  Admission status: Inpatient status.   Status is: Inpatient The patient requires at least 2 midnights for further evaluation and treatment of present condition.   Darlin Drop MD [facility] Pager [phone]  If 7PM-7AM, please contact night-coverage [email or web address] Password [email or web address]  04/12/2023, 1:00 AM

## 2023-04-12 NOTE — Plan of Care (Signed)

## 2023-04-12 NOTE — Progress Notes (Signed)
Patient admitted early this morning for severe abdominal pain from recurrent acute pancreatitis.  Patient seen and examined at bedside and plan of care discussed with him.  I have reviewed patient's medical records including this morning's H&P, current vitals, labs and medications myself.  Continue IV fluids and analgesics.  Follow GI recommendations.

## 2023-04-13 DIAGNOSIS — K859 Acute pancreatitis without necrosis or infection, unspecified: Secondary | ICD-10-CM | POA: Diagnosis not present

## 2023-04-13 LAB — CBC WITH DIFFERENTIAL/PLATELET
Abs Immature Granulocytes: 0.02 10*3/uL (ref 0.00–0.07)
Basophils Absolute: 0 10*3/uL (ref 0.0–0.1)
Basophils Relative: 0 %
Eosinophils Absolute: 0.6 10*3/uL — ABNORMAL HIGH (ref 0.0–0.5)
Eosinophils Relative: 8 %
HCT: 35.6 % — ABNORMAL LOW (ref 39.0–52.0)
Hemoglobin: 11.6 g/dL — ABNORMAL LOW (ref 13.0–17.0)
Immature Granulocytes: 0 %
Lymphocytes Relative: 28 %
Lymphs Abs: 2.1 10*3/uL (ref 0.7–4.0)
MCH: 29.4 pg (ref 26.0–34.0)
MCHC: 32.6 g/dL (ref 30.0–36.0)
MCV: 90.4 fL (ref 80.0–100.0)
Monocytes Absolute: 0.8 10*3/uL (ref 0.1–1.0)
Monocytes Relative: 11 %
Neutro Abs: 3.9 10*3/uL (ref 1.7–7.7)
Neutrophils Relative %: 53 %
Platelets: 334 10*3/uL (ref 150–400)
RBC: 3.94 MIL/uL — ABNORMAL LOW (ref 4.22–5.81)
RDW: 12.9 % (ref 11.5–15.5)
WBC: 7.4 10*3/uL (ref 4.0–10.5)
nRBC: 0 % (ref 0.0–0.2)

## 2023-04-13 LAB — COMPREHENSIVE METABOLIC PANEL
ALT: 30 U/L (ref 0–44)
AST: 19 U/L (ref 15–41)
Albumin: 3.8 g/dL (ref 3.5–5.0)
Alkaline Phosphatase: 58 U/L (ref 38–126)
Anion gap: 8 (ref 5–15)
BUN: <5 mg/dL — ABNORMAL LOW (ref 6–20)
CO2: 24 mmol/L (ref 22–32)
Calcium: 8.9 mg/dL (ref 8.9–10.3)
Chloride: 106 mmol/L (ref 98–111)
Creatinine, Ser: 0.52 mg/dL — ABNORMAL LOW (ref 0.61–1.24)
GFR, Estimated: >60 mL/min (ref >60)
Glucose, Bld: 106 mg/dL — ABNORMAL HIGH (ref 70–99)
Potassium: 3.8 mmol/L (ref 3.5–5.1)
Sodium: 138 mmol/L (ref 135–145)
Total Bilirubin: 0.3 mg/dL (ref 0.3–1.2)
Total Protein: 6.7 g/dL (ref 6.5–8.1)

## 2023-04-13 LAB — MAGNESIUM: Magnesium: 2 mg/dL (ref 1.7–2.4)

## 2023-04-13 LAB — PROTIME-INR
INR: 2 — ABNORMAL HIGH (ref 0.8–1.2)
Prothrombin Time: 22.6 s — ABNORMAL HIGH (ref 11.4–15.2)

## 2023-04-13 MED ORDER — OXYCODONE HCL 5 MG PO TABS: 5.0000 mg | ORAL_TABLET | Freq: Four times a day (QID) | ORAL | 0 refills | Status: DC | PRN

## 2023-04-13 MED ORDER — WARFARIN SODIUM 4 MG PO TABS
4.5000 mg | ORAL_TABLET | Freq: Once | ORAL | Status: DC
  Filled 2023-04-13: qty 1

## 2023-04-13 MED ORDER — POLYETHYLENE GLYCOL 3350 17 G PO PACK: 17.0000 g | PACK | Freq: Every day | ORAL | 0 refills | Status: DC | PRN

## 2023-04-13 MED ORDER — ONDANSETRON HCL 4 MG PO TABS: 4.0000 mg | ORAL_TABLET | Freq: Three times a day (TID) | ORAL | 0 refills | Status: DC | PRN

## 2023-04-13 NOTE — Progress Notes (Signed)
Subjective: No complaints.  Feeling well.  His back pain markedly improved.  Objective: Vital signs in last 24 hours: Temp:  [97.5 F (36.4 C)-98.7 F (37.1 C)] 97.5 F (36.4 C) (11/02 0802) Pulse Rate:  [70-78] 78 (11/02 0802) Resp:  [16-18] 16 (11/02 0802) BP: (151-158)/(91-107) 157/104 (11/02 0802) SpO2:  [98 %-99 %] 98 % (11/02 0802) Last BM Date : 04/12/23  Intake/Output from previous day: 11/01 0701 - 11/02 0700 In: 1194 [P.O.:1194] Out: 3100 [Urine:3100] Intake/Output this shift: Total I/O In: -  Out: 450 [Urine:450]  General appearance: alert and no distress GI: soft, non-tender; bowel sounds normal; no masses,  no organomegaly  Lab Results: Recent Labs    04/11/23 1659 04/12/23 0731 04/13/23 0318  WBC 13.5* 9.1 7.4  HGB 13.0 11.8* 11.6*  HCT 39.6 35.5* 35.6*  PLT 418* 324 334   BMET Recent Labs    04/11/23 1659 04/12/23 0731 04/13/23 0318  NA 143 140 138  K 3.7 3.6 3.8  CL 101 106 106  CO2 28 26 24   GLUCOSE 113* 112* 106*  BUN 12 5* <5*  CREATININE 0.83 0.55* 0.52*  CALCIUM 9.7 8.8* 8.9   LFT Recent Labs    04/13/23 0318  PROT 6.7  ALBUMIN 3.8  AST 19  ALT 30  ALKPHOS 58  BILITOT 0.3   PT/INR Recent Labs    04/12/23 0731 04/13/23 0318  LABPROT 23.0* 22.6*  INR 2.0* 2.0*   Hepatitis Panel No results for input(s): "HEPBSAG", "HCVAB", "HEPAIGM", "HEPBIGM" in the last 72 hours. C-Diff No results for input(s): "CDIFFTOX" in the last 72 hours. Fecal Lactopherrin No results for input(s): "FECLLACTOFRN" in the last 72 hours.  Studies/Results: CT ABDOMEN PELVIS W CONTRAST  Result Date: 04/11/2023 CLINICAL DATA:  Abdominal pain and back pain with chills, nausea and vomiting. EXAM: CT ABDOMEN AND PELVIS WITH CONTRAST TECHNIQUE: Multidetector CT imaging of the abdomen and pelvis was performed using the standard protocol following bolus administration of intravenous contrast. RADIATION DOSE REDUCTION: This exam was performed according to  the departmental dose-optimization program which includes automated exposure control, adjustment of the mA and/or kV according to patient size and/or use of iterative reconstruction technique. CONTRAST:  OMNIPAQUE IOHEXOL 300 MG/ML  SOLN COMPARISON:  March 12, 2023 FINDINGS: Lower chest: No acute abnormality. Multiple sternal wires are noted. Hepatobiliary: There is diffuse fatty infiltration of the liver parenchyma. No focal liver abnormality is seen. No gallstones, gallbladder wall thickening, or biliary dilatation. Pancreas: There is mild to moderate severity peripancreatic inflammatory fat stranding. This is mildly increased in severity when compared to the prior exam. A 3.7 cm x 2.6 cm x 2.7 cm partially calcified area of low attenuation is again seen within the head of the pancreas. Pancreatic ductal dilatation is noted (approximately 9 mm in diameter within the region of the body of the pancreas). Spleen: No splenic injury or perisplenic hematoma. Adrenals/Urinary Tract: Adrenal glands are unremarkable. Kidneys are normal, without renal calculi, focal lesion, or hydronephrosis. The urinary bladder is poorly distended and subsequently limited in evaluation. Stomach/Bowel: Stomach is within normal limits. Appendix appears normal. No evidence of bowel wall thickening, distention, or inflammatory changes. Vascular/Lymphatic: No significant vascular findings are present. No enlarged abdominal or pelvic lymph nodes. Reproductive: Prostate is unremarkable. Other: No abdominal wall hernia or abnormality. No abdominopelvic ascites. Musculoskeletal: No acute or significant osseous findings. IMPRESSION: 1. Acute pancreatitis, mildly increased in severity when compared to the prior exam. 2. 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 3. Hepatic steatosis. Electronically Signed   By: Aram Candela M.D.   On: 04/11/2023 21:14     Medications: Scheduled:  aspirin EC  81 mg Oral Daily   feeding supplement  1 Container Oral TID BM   influenza vac split trivalent PF  0.5 mL Intramuscular Tomorrow-1000   metoprolol succinate  12.5 mg Oral QHS   warfarin  4.5 mg Oral ONCE-1600   Warfarin - Pharmacist Dosing Inpatient   Does not apply q1600   Continuous:  Assessment/Plan: 1) Acute on chronic ETOH pancreatitis. 2) ETOH abuse.   Currently he feels well.  He ate very well this AM.  If he is able to tolerate his lunch he can be discharged home.  His back pain markedly improved overnight after a dose of pain medication last evening.    Plan: 1) Okay to D/C today if he tolerates lunch. 2) Follow up with [facility] as scheduled.  LOS: 2 days   [name] [name] D 04/13/2023, 9:31 AM

## 2023-04-13 NOTE — Plan of Care (Signed)
  Problem: Education: Goal: Knowledge of General Education information will improve Description: Including pain rating scale, medication(s)/side effects and non-pharmacologic comfort measures Outcome: Adequate for Discharge   Problem: Health Behavior/Discharge Planning: Goal: Ability to manage health-related needs will improve Outcome: Adequate for Discharge   Problem: Clinical Measurements: Goal: Ability to maintain clinical measurements within normal limits will improve Outcome: Adequate for Discharge Goal: Will remain free from infection Outcome: Adequate for Discharge Goal: Diagnostic test results will improve Outcome: Adequate for Discharge Goal: Respiratory complications will improve Outcome: Adequate for Discharge Goal: Cardiovascular complication will be avoided Outcome: Adequate for Discharge   Problem: Clinical Measurements: Goal: Ability to maintain clinical measurements within normal limits will improve Outcome: Adequate for Discharge Goal: Will remain free from infection Outcome: Adequate for Discharge Goal: Diagnostic test results will improve Outcome: Adequate for Discharge Goal: Respiratory complications will improve Outcome: Adequate for Discharge Goal: Cardiovascular complication will be avoided Outcome: Adequate for Discharge   Problem: Activity: Goal: Risk for activity intolerance will decrease Outcome: Adequate for Discharge   Problem: Nutrition: Goal: Adequate nutrition will be maintained Outcome: Adequate for Discharge   Problem: Coping: Goal: Level of anxiety will decrease Outcome: Adequate for Discharge   Problem: Elimination: Goal: Will not experience complications related to bowel motility Outcome: Adequate for Discharge Goal: Will not experience complications related to urinary retention Outcome: Adequate for Discharge   Problem: Pain Management: Goal: General experience of comfort will improve Outcome: Adequate for Discharge    Problem: Safety: Goal: Ability to remain free from injury will improve Outcome: Adequate for Discharge   Problem: Skin Integrity: Goal: Risk for impaired skin integrity will decrease Outcome: Adequate for Discharge

## 2023-04-13 NOTE — Progress Notes (Signed)
Discharge instructions, RX's and follow up appts explained and provided to patient verbalized understanding. Patient left floor accompanied by staff no c/o pain at d/c.   Francely Craw, Kae Heller, RN

## 2023-04-13 NOTE — Progress Notes (Signed)
PHARMACY - ANTICOAGULATION CONSULT NOTE  Pharmacy Consult for warfarin Indication: mechanical valve  Allergies  Allergen Reactions   Amoxicillin Hives    Patient Measurements: Height: 5\' 11"  (180.3 cm) Weight: 74.8 kg (165 lb) IBW/kg (Calculated) : 75.3 Heparin Dosing Weight:   Vital Signs: Temp: 97.5 F (36.4 C) (11/02 0802) Temp Source: Oral (11/02 0515) BP: 157/104 (11/02 0802) Pulse Rate: 78 (11/02 0802)  Labs: Recent Labs    04/11/23 1659 04/12/23 0003 04/12/23 0731 04/13/23 0318  HGB 13.0  --  11.8* 11.6*  HCT 39.6  --  35.5* 35.6*  PLT 418*  --  324 334  LABPROT  --  23.8* 23.0* 22.6*  INR  --  2.1* 2.0* 2.0*  CREATININE 0.83  --  0.55* 0.52*    Estimated Creatinine Clearance: 140.3 mL/min (A) (by C-G formula based on SCr of 0.52 mg/dL (L)).   Medical History: Past Medical History:  Diagnosis Date   Asthma    Headache    migraines   Heart murmur    Hypokalemia 06/08/2020   Leukocytosis 01/23/2022   Mild aortic stenosis    Pancreatitis    Pneumonia    as a child x several   Substance abuse (HCC)     Medications:  Home warfarin dose 6mg  on M,W,F and 4.5mg  all other days  Assessment: 32 y.o. male with PMHx recurrent pancreatitis, asthma, headache, substance use disorder who presents to ED concerned for LUQ abdominal pain, nausea, fatigue. Pharmacy to dose warfarin   INR remains stable at 2.0 this AM within goal range. Plt 334 and hgb 11.6.   Goal of Therapy:  INR 1.5-2 OF NOTE: patient underwent replacement of aortic valve in April 2023 using a newer generation ON-X valve. Following results of the PROACT trial, the valve received FDA approval with an INR goal of 1.5-2.0 when used in the aortic position.    Plan:  Warfarin 4.5 mg PO x 1 today Daily INR and CBC Monitor H&H    Jerry Caras, PharmD PGY2 Oncology Pharmacy Resident   04/13/2023 8:18 AM

## 2023-04-13 NOTE — Discharge Summary (Signed)
Physician Discharge Summary  [name] [name] DGL:[id number] DOB: 06/04/1991 DOA: 04/11/2023  PCP: [name], NP  Admit date: 04/11/2023 Discharge date: 04/13/2023  Admitted From: Home Disposition: Home  Recommendations for Outpatient Follow-up:  Follow up with PCP in 1 week with repeat CBC/CMP/INR Outpatient follow-up with gastroenterology Follow up in ED if symptoms worsen or new appear   Home Health: No Equipment/Devices: None  Discharge Condition: Stable CODE STATUS: Full Diet recommendation: Heart healthy/soft diet  Brief/Interim Summary: 32 y.o. male with medical history significant for asthma, GERD on PPI, hypertension, bicuspid aortic valve with severe aortic insufficiency and moderate aortic stenosis status post mechanical aortic valve replacement and aortoplasty on 09/27/2021 with goal INR between 1.5-2 on Coumadin and aspirin indefinitely, asthma, recurrent multiple episodes of acute pancreatitis since 2021, recently admitted for acute pancreatitis on 04/02/2023 and discharged on 04/03/2023 presented with severe upper abdominal pain radiating to his back.  Imaging revealed findings suggestive of acute pancreatitis.  He was started on IV fluids and analgesics.  GI was consulted.  During the hospitalization, his condition has gradually improved.  He is tolerating clear liquid diet and has improved abdominal pain.  He feels okay to go home today.  His diet will be advanced to soft diet today and if he tolerates it, he will be discharged home today.  GI has cleared him for discharge if he tolerates diet today.  Outpatient follow-up with PCP and GI.  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  No recent use of alcohol.  No gallstones seen on CT scan. -Treated conservatively with IV fluids and analgesics. GI was consulted.  During the hospitalization,  his condition has gradually improved.  He is tolerating clear liquid diet and has improved abdominal pain.  He feels okay to go home today.  His diet will be advanced to soft diet today and if he tolerates it, he will be discharged home today.  GI has cleared him for discharge if he tolerates diet today.  Outpatient follow-up with PCP and GI.  Leukocytosis -Resolved  Thrombocytosis -Resolved  Normocytic anemia -Questionable cause.  Hemoglobin stable.  Outpatient follow-up  History of bicuspid aortic valve with severe aortic insufficiency and moderate aortic stenosis status post mechanical aortic valve replacement and aortoplasty on 09/27/2021 -INR currently stable.  Coumadin and aspirin being continued as recommended by patient's outpatient cardiothoracic surgery team.  Continue the same at discharge.  Outpatient follow-up with PCP.  Outpatient follow-up of INR.  Outpatient follow-up with cardiothoracic surgery  Asthma -Stable.  Outpatient follow-up  GERD -Continue PPI  Hypertension -Blood pressure on the higher side.  Continue metoprolol.  Outpatient follow-up.   Discharge Instructions  Discharge Instructions     Ambulatory referral to Gastroenterology   Complete by: As directed    Hospital follow-up   What is the reason for referral?: Other   Diet - low sodium heart healthy   Complete by: As directed    Soft diet   Increase activity slowly   Complete by: As directed       Allergies as of 04/13/2023       Reactions   Amoxicillin Hives        Medication List     STOP taking these medications    acetaminophen 325 MG tablet Commonly known as: Tylenol       TAKE these medications    aspirin EC 81 MG tablet Take 1 tablet (  81 mg total) by mouth daily. Swallow whole.   loratadine 10 MG tablet Commonly known as: CLARITIN Take 10 mg by mouth daily.   METAMUCIL FIBER PO Take 10 mLs by mouth every other day.   metoprolol succinate 25 MG 24 hr  tablet Commonly known as: Toprol XL Take 0.5 tablets (12.5 mg total) by mouth at bedtime.   ondansetron 4 MG tablet Commonly known as: Zofran Take 1 tablet (4 mg total) by mouth every 8 (eight) hours as needed for nausea or vomiting.   oxyCODONE 5 MG immediate release tablet Commonly known as: Roxicodone Take 1 tablet (5 mg total) by mouth every 6 (six) hours as needed for moderate pain (pain score 4-6) or severe pain (pain score 7-10). What changed: reasons to take this   pantoprazole 20 MG tablet Commonly known as: PROTONIX Take 1 tablet (20 mg total) by mouth daily. Take 1 pill twice a day for 2 weeks, then 1 pill every morning.   polyethylene glycol 17 g packet Commonly known as: MIRALAX / GLYCOLAX Take 17 g by mouth daily as needed for mild constipation.   warfarin 3 MG tablet Commonly known as: COUMADIN Take as directed. If you are unsure how to take this medication, talk to your nurse or doctor. Original instructions: TAKE 1 AND 1/2 TABLETS BY MOUTH DAILY. EXCEPT TAKE 2 TABLETS ON MONDAYS, WEDNESDAYS AND FRIDAYS OR AS DIRECTED BY COAGULATION CLINIC What changed: See the new instructions.        Follow-up Information     [name], NP. Schedule an appointment as soon as possible for a visit in 1 week(s).   Specialty: Family Medicine Contact information: [address] [place] [address] [phone]                Allergies  Allergen Reactions   Amoxicillin Hives    Consultations: GI   Procedures/Studies: CT ABDOMEN PELVIS W CONTRAST  Result Date: 04/11/2023 CLINICAL DATA:  Abdominal pain and back pain with chills, nausea and vomiting. EXAM: CT ABDOMEN AND PELVIS WITH CONTRAST TECHNIQUE: Multidetector CT imaging of the abdomen and pelvis was performed using the standard protocol following bolus administration of intravenous contrast. RADIATION DOSE REDUCTION: This exam was performed according to the departmental dose-optimization  program which includes automated exposure control, adjustment of the mA and/or kV according to patient size and/or use of iterative reconstruction technique. CONTRAST:  OMNIPAQUE IOHEXOL 300 MG/ML  SOLN COMPARISON:  March 12, 2023 FINDINGS: Lower chest: No acute abnormality. Multiple sternal wires are noted. Hepatobiliary: There is diffuse fatty infiltration of the liver parenchyma. No focal liver abnormality is seen. No gallstones, gallbladder wall thickening, or biliary dilatation. Pancreas: There is mild to moderate severity peripancreatic inflammatory fat stranding. This is mildly increased in severity when compared to the prior exam. A 3.7 cm x 2.6 cm x 2.7 cm partially calcified area of low attenuation is again seen within the head of the pancreas. Pancreatic ductal dilatation is noted (approximately 9 mm in diameter within the region of the body of the pancreas). Spleen: No splenic injury or perisplenic hematoma. Adrenals/Urinary Tract: Adrenal glands are unremarkable. Kidneys are normal, without renal calculi, focal lesion, or hydronephrosis. The urinary bladder is poorly distended and subsequently limited in evaluation. Stomach/Bowel: Stomach is within normal limits. Appendix appears normal. No evidence of bowel wall thickening, distention, or inflammatory changes. Vascular/Lymphatic: No significant vascular findings are present. No enlarged abdominal or pelvic lymph nodes. Reproductive: Prostate is unremarkable. Other: No abdominal wall  hernia or abnormality. No abdominopelvic ascites. Musculoskeletal: No acute or significant osseous findings. IMPRESSION: 1. Acute pancreatitis, mildly increased in severity when compared to the prior exam. 2. 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 3. Hepatic steatosis. Electronically Signed   By: Aram Candela M.D.   On: 04/11/2023 21:14      Subjective: Patient seen and  examined at bedside.  Feels much better, tolerating diet with improving abdominal pain.  He is able to go home today.  No fever, chest pain or shortness of breath reported.  Discharge Exam: Vitals:   04/13/23 0515 04/13/23 0802  BP: (!) 152/99 (!) 157/104  Pulse: 73 78  Resp:  16  Temp: 98.7 F (37.1 C) (!) 97.5 F (36.4 C)  SpO2: 98% 98%    General: Pt is alert, awake, not in acute distress.  On room air. Cardiovascular: rate controlled, S1/S2 + Respiratory: bilateral decreased breath sounds at bases Abdominal: Soft, NT, ND, bowel sounds + Extremities: no edema, no cyanosis    The results of significant diagnostics from this hospitalization (including imaging, microbiology, ancillary and laboratory) are listed below for reference.     Microbiology: No results found for this or any previous visit (from the past 240 hour(s)).   Labs: BNP (last 3 results) No results for input(s): "BNP" in the last 8760 hours. Basic Metabolic Panel: Recent Labs  Lab 04/11/23 1659 04/12/23 0731 04/13/23 0318  NA 143 140 138  K 3.7 3.6 3.8  CL 101 106 106  CO2 28 26 24   GLUCOSE 113* 112* 106*  BUN 12 5* <5*  CREATININE 0.83 0.55* 0.52*  CALCIUM 9.7 8.8* 8.9  MG  --  2.0 2.0  PHOS  --  3.7  --    Liver Function Tests: Recent Labs  Lab 04/11/23 1659 04/12/23 0731 04/13/23 0318  AST 29 21 19   ALT 38 30 30  ALKPHOS 68 60 58  BILITOT 0.5 0.5 0.3  PROT 8.0 6.3* 6.7  ALBUMIN 4.6 3.6 3.8   Recent Labs  Lab 04/11/23 1659  LIPASE 103*   No results for input(s): "AMMONIA" in the last 168 hours. CBC: Recent Labs  Lab 04/11/23 1659 04/12/23 0731 04/13/23 0318  WBC 13.5* 9.1 7.4  NEUTROABS 9.4*  --  3.9  HGB 13.0 11.8* 11.6*  HCT 39.6 35.5* 35.6*  MCV 92.3 91.0 90.4  PLT 418* 324 334   Cardiac Enzymes: No results for input(s): "CKTOTAL", "CKMB", "CKMBINDEX", "TROPONINI" in the last 168 hours. BNP: Invalid input(s): "POCBNP" CBG: No results for input(s): "GLUCAP" in  the last 168 hours. D-Dimer No results for input(s): "DDIMER" in the last 72 hours. Hgb A1c No results for input(s): "HGBA1C" in the last 72 hours. Lipid Profile No results for input(s): "CHOL", "HDL", "LDLCALC", "TRIG", "CHOLHDL", "LDLDIRECT" in the last 72 hours. Thyroid function studies No results for input(s): "TSH", "T4TOTAL", "T3FREE", "THYROIDAB" in the last 72 hours.  Invalid input(s): "FREET3" Anemia work up No results for input(s): "VITAMINB12", "FOLATE", "FERRITIN", "TIBC", "IRON", "RETICCTPCT" in the last 72 hours. Urinalysis    Component Value Date/Time   COLORURINE YELLOW 04/11/2023 1749   APPEARANCEUR CLEAR 04/11/2023 1749   LABSPEC 1.016 04/11/2023 1749   PHURINE 7.0 04/11/2023 1749   GLUCOSEU NEGATIVE 04/11/2023 1749   HGBUR MODERATE (A) 04/11/2023 1749   BILIRUBINUR NEGATIVE 04/11/2023 1749   KETONESUR NEGATIVE 04/11/2023 1749   PROTEINUR NEGATIVE 04/11/2023 1749   NITRITE NEGATIVE 04/11/2023 1749  LEUKOCYTESUR NEGATIVE 04/11/2023 1749   Sepsis Labs Recent Labs  Lab 04/11/23 1659 04/12/23 0731 04/13/23 0318  WBC 13.5* 9.1 7.4   Microbiology No results found for this or any previous visit (from the past 240 hour(s)).   Time coordinating discharge: 35 minutes  SIGNED:   Glade Lloyd, MD  [facility] 04/13/2023, 9:55 AM

## 2023-04-13 NOTE — Progress Notes (Signed)
Patient is wanting to be discharged.  He stated that "being in the hospital is messing up my plans".  I explained that he may need be here for another night or two.  He will need to eat something and see how his pain is afterwards.  Patient is currently on a clear liquid diet.  Patient would like to speak with the rounding doctors today.

## 2023-04-17 LAB — IGG 4: IgG, Subclass 4: 18 mg/dL (ref 2–96)

## 2023-04-25 ENCOUNTER — Ambulatory Visit (INDEPENDENT_AMBULATORY_CARE_PROVIDER_SITE_OTHER): Payer: Commercial Managed Care - PPO | Admitting: Family

## 2023-04-25 VITALS — BP 124/77 | HR 78 | Temp 98.0°F | Ht 71.0 in | Wt 165.4 lb

## 2023-04-25 DIAGNOSIS — R634 Abnormal weight loss: Secondary | ICD-10-CM

## 2023-04-25 DIAGNOSIS — Z7901 Long term (current) use of anticoagulants: Secondary | ICD-10-CM | POA: Diagnosis not present

## 2023-04-25 DIAGNOSIS — K861 Other chronic pancreatitis: Secondary | ICD-10-CM

## 2023-04-25 MED ORDER — METHOCARBAMOL 500 MG PO TABS: 500.0000 mg | ORAL_TABLET | Freq: Four times a day (QID) | ORAL | 0 refills | Status: DC

## 2023-04-25 MED ORDER — OXYCODONE HCL 5 MG PO TABS
5.0000 mg | ORAL_TABLET | Freq: Four times a day (QID) | ORAL | 0 refills | Status: DC | PRN
Start: 2023-04-25 — End: 2023-05-22

## 2023-04-25 NOTE — Progress Notes (Signed)
Patient ID: [name] [name], male    DOB: [birth year], 32 y.o.   MRN: [id number]  Chief Complaint  Patient presents with   Follow-up    Pt was seen in ED on 10/31 for acute pancreatitis. Pt states he was out of Oxycodone on Thursday, pain lasted for 12 hours and pain went away next morning. Pain has been a level 1 since then.    Discussed the use of AI scribe software for clinical note transcription with the patient, who gave verbal consent to proceed.  History of Present Illness   The patient, with a history of recurrent pancreatitis, presents with recurrent episodes of abdominal and back pain. The pain is described as severe, reaching up to 8 or 9 on a scale of 10, and is not clearly associated with food intake. The patient has had three visits to the emergency room due to this pain, with two resulting in hospital stays. The patient also reports weight loss, dropping from 170 to 165 pounds over the past month, but believes they may now be maintaining their weight. The patient's appetite is reportedly okay, and they have been managing to eat without significant discomfort. The patient has been taking oxycodone for pain management, which has been effective but causes drowsiness. The patient has also been using over-the-counter remedies such as Tylenol, hot showers, and heating pads to manage the pain. The patient has an upcoming appointment with a gastroenterologist for further evaluation.     Assessment & Plan:     Recurrent Pancreatitis - Episodes of severe abdominal and back pain, not clearly related to food intake. Patient has had multiple ER visits and hospitalizations. A few bouts of nausea and abd pain since hospital d/c Awaiting GI consultation and potential endoscopic evaluation for possible pancreatic duct blockage. -Mild anemia w/last hospital visit, recheck CBC w/diff today. -Continue current pain management strategy with oxycodone as needed. -Trial of methocarbamol  (Robaxin) for pain relief. -Continue food journal to identify potential triggers.-Keep scheduled GI appointment.  Warfarin Management - Patient on warfarin for unknown indication, recent INR values have been high. Patient has made dietary adjustments and will have INR checked tomorrow. -Check INR tomorrow as planned at Cardiology office.  Weight Loss - Patient has experienced significant weight loss, potentially related to pancreatitis episodes. Current weight appears to be stabilizing. -Continue monitoring weight. -Encourage maintenance of regular, small meals.     Subjective:    Outpatient Medications Prior to Visit  Medication Sig Dispense Refill   aspirin EC 81 MG EC tablet Take 1 tablet (81 mg total) by mouth daily. Swallow whole. 30 tablet 11   loratadine (CLARITIN) 10 MG tablet Take 10 mg by mouth daily.     METAMUCIL FIBER PO Take 10 mLs by mouth every other day.     metoprolol succinate (TOPROL XL) 25 MG 24 hr tablet Take 0.5 tablets (12.5 mg total) by mouth at bedtime. 45 tablet 3   ondansetron (ZOFRAN) 4 MG tablet Take 1 tablet (4 mg total) by mouth every 8 (eight) hours as needed for nausea or vomiting. 20 tablet 0   pantoprazole (PROTONIX) 20 MG tablet Take 1 tablet (20 mg total) by mouth daily. Take 1 pill twice a day for 2 weeks, then 1 pill every morning. 45 tablet 1   warfarin (COUMADIN) 3 MG tablet TAKE 1 AND 1/2 TABLETS BY MOUTH DAILY. EXCEPT TAKE 2 TABLETS ON MONDAYS, WEDNESDAYS AND FRIDAYS OR AS DIRECTED BY COAGULATION CLINIC (Patient taking differently: Take 3  mg by mouth See admin instructions. TAKE 1 AND 1/2 TABLETS BY MOUTH DAILY. EXCEPT TAKE 2 TABLETS ON MONDAYS, WEDNESDAYS AND FRIDAYS OR AS DIRECTED BY COAGULATION CLINIC) 45 tablet 3   oxyCODONE (ROXICODONE) 5 MG immediate release tablet Take 1 tablet (5 mg total) by mouth every 6 (six) hours as needed for moderate pain (pain score 4-6) or severe pain (pain score 7-10). (Patient not taking: Reported on 04/25/2023) 20  tablet 0   polyethylene glycol (MIRALAX / GLYCOLAX) 17 g packet Take 17 g by mouth daily as needed for mild constipation. (Patient not taking: Reported on 04/25/2023) 14 each 0   No facility-administered medications prior to visit.   Past Medical History:  Diagnosis Date   Asthma    Headache    migraines   Heart murmur    Hypokalemia 06/08/2020   Leukocytosis 01/23/2022   Mild aortic stenosis    Pancreatitis    Pneumonia    as a child x several   Substance abuse [facility])    Past Surgical History:  Procedure Laterality Date   AORTIC VALVE REPLACEMENT N/A 09/27/2021   Procedure: AORTIC VALVE REPLACEMENT (AVR) USING ON-X AORTIC VALVE SIZE ;  Surgeon: Loreli Slot, MD;  Location: [facility] OR;  Service: Open Heart Surgery;  Laterality: N/A;   INGUINAL HERNIA REPAIR Right 01/05/2022   Procedure: OPEN REPAIR INCARCERATED RIGHT INGUINAL HERNIA WITH MESH;  Surgeon: Gaynelle Adu, MD;  Location: [facility] ORS;  Service: General;  Laterality: Right;  90 MIN - ROOM 2   INGUINAL HERNIA REPAIR Left 12/20/2022   Procedure: OPEN REPAIR LEFT INGUINAL HERNIA WITH MESH;  Surgeon: Gaynelle Adu, MD;  Location: [facility] ORS;  Service: General;  Laterality: Left;   REPLACEMENT ASCENDING AORTA N/A 09/27/2021   Procedure: REPLACEMENT ASCENDING AND INNOMINATE ANEURYSM USING HEMASHIELD PLATINUM [id number];  Surgeon: Loreli Slot, MD;  Location: [facility] OR;  Service: Open Heart Surgery;  Laterality: N/A;   RIGHT HEART CATH AND CORONARY ANGIOGRAPHY N/A 09/07/2021   Procedure: RIGHT HEART CATH AND CORONARY ANGIOGRAPHY;  Surgeon: Orbie Pyo, MD;  Location: [facility] INVASIVE CV LAB;  Service: Cardiovascular;  Laterality: N/A;   TEE WITHOUT CARDIOVERSION N/A 09/27/2021   Procedure: TRANSESOPHAGEAL ECHOCARDIOGRAM (TEE);  Surgeon: Loreli Slot, MD;  Location: [facility] OR;  Service: Open Heart Surgery;  Laterality: N/A;   Allergies  Allergen Reactions   Amoxicillin Hives      Objective:    Physical Exam Vitals and  nursing note reviewed.  Constitutional:      General: He is not in acute distress.    Appearance: Normal appearance.  HENT:     Head: Normocephalic.  Cardiovascular:     Rate and Rhythm: Normal rate and regular rhythm.  Pulmonary:     Effort: Pulmonary effort is normal.     Breath sounds: Normal breath sounds.  Musculoskeletal:        General: Normal range of motion.     Cervical back: Normal range of motion.  Skin:    General: Skin is warm and dry.  Neurological:     Mental Status: He is alert and oriented to person, place, and time.  Psychiatric:        Mood and Affect: Mood normal.    BP 124/77 (BP Location: Left Arm)   Pulse 78   Temp 98 F (36.7 C) (Temporal)   Ht 5\' 11"  (1.803 m)   Wt 165 lb 6.4 oz (75 kg)   SpO2 96%   BMI 23.07 kg/m  Wt Readings from Last 3 Encounters:  04/25/23 165 lb 6.4 oz (75 kg)  04/11/23 165 lb (74.8 kg)  04/02/23 170 lb (77.1 kg)      [name], NP

## 2023-04-26 ENCOUNTER — Ambulatory Visit: Payer: Commercial Managed Care - PPO | Attending: Internal Medicine

## 2023-04-26 DIAGNOSIS — Z5181 Encounter for therapeutic drug level monitoring: Secondary | ICD-10-CM

## 2023-04-26 DIAGNOSIS — Z952 Presence of prosthetic heart valve: Secondary | ICD-10-CM

## 2023-04-26 LAB — CBC WITH DIFFERENTIAL/PLATELET
Basophils Absolute: 0.1 10*3/uL (ref 0.0–0.1)
Basophils Relative: 0.8 % (ref 0.0–3.0)
Eosinophils Absolute: 0.5 10*3/uL (ref 0.0–0.7)
Eosinophils Relative: 8.2 % — ABNORMAL HIGH (ref 0.0–5.0)
HCT: 36.2 % — ABNORMAL LOW (ref 39.0–52.0)
Hemoglobin: 12.1 g/dL — ABNORMAL LOW (ref 13.0–17.0)
Lymphocytes Relative: 36.5 % (ref 12.0–46.0)
Lymphs Abs: 2.3 10*3/uL (ref 0.7–4.0)
MCHC: 33.3 g/dL (ref 30.0–36.0)
MCV: 91.7 fL (ref 78.0–100.0)
Monocytes Absolute: 0.6 10*3/uL (ref 0.1–1.0)
Monocytes Relative: 8.6 % (ref 3.0–12.0)
Neutro Abs: 2.9 10*3/uL (ref 1.4–7.7)
Neutrophils Relative %: 45.9 % (ref 43.0–77.0)
Platelets: 382 10*3/uL (ref 150.0–400.0)
RBC: 3.94 Mil/uL — ABNORMAL LOW (ref 4.22–5.81)
RDW: 13.7 % (ref 11.5–15.5)
WBC: 6.4 10*3/uL (ref 4.0–10.5)

## 2023-04-26 LAB — POCT INR: INR: 1.8 — AB (ref 2.0–3.0)

## 2023-04-26 NOTE — Patient Instructions (Signed)
Description   Continue taking warfarin 4.5mg  daily except for 6mg  on Mondays.  Continue eating 5 serving of greens weekly.  Coumadin Clinic [phone] Recheck INR in 4 weeks (normally 6 weeks).

## 2023-04-29 ENCOUNTER — Ambulatory Visit: Payer: Commercial Managed Care - PPO | Admitting: Gastroenterology

## 2023-04-29 ENCOUNTER — Ambulatory Visit (INDEPENDENT_AMBULATORY_CARE_PROVIDER_SITE_OTHER): Payer: Commercial Managed Care - PPO | Admitting: Gastroenterology

## 2023-04-29 ENCOUNTER — Encounter: Payer: Self-pay | Admitting: Gastroenterology

## 2023-04-29 VITALS — BP 130/72 | Wt 167.0 lb

## 2023-04-29 DIAGNOSIS — Z952 Presence of prosthetic heart valve: Secondary | ICD-10-CM

## 2023-04-29 DIAGNOSIS — R634 Abnormal weight loss: Secondary | ICD-10-CM

## 2023-04-29 DIAGNOSIS — K861 Other chronic pancreatitis: Secondary | ICD-10-CM | POA: Diagnosis not present

## 2023-04-29 DIAGNOSIS — Z7901 Long term (current) use of anticoagulants: Secondary | ICD-10-CM

## 2023-04-29 DIAGNOSIS — A09 Infectious gastroenteritis and colitis, unspecified: Secondary | ICD-10-CM

## 2023-04-29 MED ORDER — PANCRELIPASE (LIP-PROT-AMYL) 36000-114000 UNITS PO CPEP: 72000.0000 [IU] | ORAL_CAPSULE | Freq: Three times a day (TID) | ORAL | 3 refills | Status: DC

## 2023-04-29 MED ORDER — DICYCLOMINE HCL 10 MG PO CAPS: 20.0000 mg | ORAL_CAPSULE | Freq: Three times a day (TID) | ORAL | 1 refills | Status: DC

## 2023-04-29 NOTE — Progress Notes (Signed)
Chief Complaint: Recurrent pancreatitis Primary GI MD: Gentry Fitz (initial hospital consult - Dr. 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  Since August 2023 patient has had 3 hospitalizations for acute pancreatitis with the most recent being 04/11/2023.  See imaging below.  Patient states his initial episode of pancreatitis was in 2017 elsewhere for this and felt this was alcohol related.  CTAP w contrast 01/23/2022: Acute pancreatitis and probable duodenitis.  1.1 cm developing pseudocyst.  Left inguinal hernia with nonobstructed colon, right inguinal hernia, vasculature.  No gallstones  CTAP w contrast 03/12/2023: Acute pancreatitis with 2.2 cm pseudocyst and ductal dilation.  Calcifications in the pancreatic head compatible with chronic pancreatitis.  No gallstones  CTAP w contrast 04/11/2023: Acute pancreatitis (mildly increased compared to prior exam).  3.7 cm x 2.6 cm x 2.2 cm partially calcified area of low-attenuation in the head of the pancreas.  Pancreatic ductal dilation noted (9 mm).  No gallstones  Workup during most recent admission - IgG4 normal - Triglycerides 123 - Hgb 11.6 (likely hemodilution) - CMP normal - CRP 1.2 - PT 22.6, INR 2.0  The patient reports feeling generally well since his last hospital discharge but notes he is not yet at 100%. He describes a persistent, slight discomfort in his abdomen and believes he is still experiencing the same flare-up that led to his recent hospitalization. The patient has abstained from alcohol for the past two months. He reports significant weight loss, which he attributes to a lack of appetite and changes in eating habits due to his condition (25lbs since July).   He has been keeping a Futures trader. States over the last 10 days he noted a severe episode of abdominal pain after he tried a small slice  of pizza. Tolerating boost/ensure shakes without difficulty. PCP gave him robaxin for pain but he is unsure if it is working. His biggest issue is his back pain moreso than abdominal pain.  The patient also reports persistent diarrhea that is worse with eating.   Discussed the use of AI scribe software for clinical note transcription with the patient, who gave verbal consent to proceed.  Past Medical History:  Diagnosis Date   Asthma    Headache    migraines   Heart murmur    Hypokalemia 06/08/2020   Leukocytosis 01/23/2022   Mild aortic stenosis    Pancreatitis    Pneumonia    as a child x several   Substance abuse [facility])     Past Surgical History:  Procedure Laterality Date   AORTIC VALVE REPLACEMENT N/A 09/27/2021   Procedure: AORTIC VALVE REPLACEMENT (AVR) USING ON-X AORTIC VALVE SIZE ;  Surgeon: Loreli Slot, MD;  Location: [facility] OR;  Service: Open Heart Surgery;  Laterality: N/A;   INGUINAL HERNIA REPAIR Right 01/05/2022   Procedure: OPEN REPAIR INCARCERATED RIGHT INGUINAL HERNIA WITH MESH;  Surgeon: Gaynelle Adu, MD;  Location: [facility] ORS;  Service: General;  Laterality: Right;  90 MIN - ROOM 2   INGUINAL HERNIA REPAIR Left 12/20/2022   Procedure: OPEN REPAIR LEFT INGUINAL HERNIA WITH MESH;  Surgeon: Gaynelle Adu, MD;  Location: [facility] ORS;  Service: General;  Laterality: Left;   REPLACEMENT ASCENDING AORTA N/A 09/27/2021   Procedure: REPLACEMENT ASCENDING AND INNOMINATE ANEURYSM USING HEMASHIELD PLATINUM [id number];  Surgeon: Loreli Slot, MD;  Location: [facility] OR;  Service: Open Heart Surgery;  Laterality: N/A;   RIGHT  HEART CATH AND CORONARY ANGIOGRAPHY N/A 09/07/2021   Procedure: RIGHT HEART CATH AND CORONARY ANGIOGRAPHY;  Surgeon: Orbie Pyo, MD;  Location: [facility] INVASIVE CV LAB;  Service: Cardiovascular;  Laterality: N/A;   TEE WITHOUT CARDIOVERSION N/A 09/27/2021   Procedure: TRANSESOPHAGEAL ECHOCARDIOGRAM (TEE);  Surgeon: Loreli Slot, MD;  Location:  [facility] OR;  Service: Open Heart Surgery;  Laterality: N/A;    Current Outpatient Medications  Medication Sig Dispense Refill   aspirin EC 81 MG EC tablet Take 1 tablet (81 mg total) by mouth daily. Swallow whole. 30 tablet 11   loratadine (CLARITIN) 10 MG tablet Take 10 mg by mouth daily.     METAMUCIL FIBER PO Take 10 mLs by mouth every other day.     methocarbamol (ROBAXIN) 500 MG tablet Take 1 tablet (500 mg total) by mouth 4 (four) times daily. 20 tablet 0   metoprolol succinate (TOPROL XL) 25 MG 24 hr tablet Take 0.5 tablets (12.5 mg total) by mouth at bedtime. 45 tablet 3   ondansetron (ZOFRAN) 4 MG tablet Take 1 tablet (4 mg total) by mouth every 8 (eight) hours as needed for nausea or vomiting. 20 tablet 0   oxyCODONE (ROXICODONE) 5 MG immediate release tablet Take 1 tablet (5 mg total) by mouth every 6 (six) hours as needed for moderate pain (pain score 4-6) or severe pain (pain score 7-10). 6 tablet 0   pantoprazole (PROTONIX) 20 MG tablet Take 1 tablet (20 mg total) by mouth daily. Take 1 pill twice a day for 2 weeks, then 1 pill every morning. 45 tablet 1   polyethylene glycol (MIRALAX / GLYCOLAX) 17 g packet Take 17 g by mouth daily as needed for mild constipation. 14 each 0   warfarin (COUMADIN) 3 MG tablet TAKE 1 AND 1/2 TABLETS BY MOUTH DAILY. EXCEPT TAKE 2 TABLETS ON MONDAYS, WEDNESDAYS AND FRIDAYS OR AS DIRECTED BY COAGULATION CLINIC (Patient taking differently: Take 3 mg by mouth See admin instructions. TAKE 1 AND 1/2 TABLETS BY MOUTH DAILY. EXCEPT TAKE 2 TABLETS ON MONDAYS, WEDNESDAYS AND FRIDAYS OR AS DIRECTED BY COAGULATION CLINIC) 45 tablet 3   No current facility-administered medications for this visit.    Allergies as of 04/29/2023 - Review Complete 04/29/2023  Allergen Reaction Noted   Amoxicillin Hives 11/01/2016    Family History  Problem Relation Age of Onset   Pancreatitis Maternal Uncle     Social History   Socioeconomic History   Marital status: Single     Spouse name: Not on file   Number of children: Not on file   Years of education: Not on file   Highest education level: Bachelor's degree (e.g., BA, AB, BS)  Occupational History   Occupation: Bartender    Comment: [employer]  Tobacco Use   Smoking status: Never   Smokeless tobacco: Never  Vaping Use   Vaping status: Never Used  Substance and Sexual Activity   Alcohol use: Yes    Comment: none x 1 month as od 04/11/2023   Drug use: Not Currently    Types: Other-see comments    Comment: Edibles occasionally marijuana   Sexual activity: Yes  Other Topics Concern   Not on file  Social History Narrative   Not on file   Social Determinants of Health   Financial Resource Strain: Low Risk  (04/25/2023)   Overall Financial Resource Strain (CARDIA)    Difficulty of Paying Living Expenses: Not very hard  Food Insecurity: No Food Insecurity (04/25/2023)  Hunger Vital Sign    Worried About Running Out of Food in the Last Year: Never true    Ran Out of Food in the Last Year: Never true  Transportation Needs: No Transportation Needs (04/25/2023)   PRAPARE - Administrator, Civil Service (Medical): No    Lack of Transportation (Non-Medical): No  Physical Activity: Sufficiently Active (04/25/2023)   Exercise Vital Sign    Days of Exercise per Week: 5 days    Minutes of Exercise per Session: 150+ min  Stress: No Stress Concern Present (04/25/2023)   [employer] of Occupational Health - Occupational Stress Questionnaire    Feeling of Stress : Only a little  Social Connections: Unknown (04/25/2023)   Social Connection and Isolation Panel [NHANES]    Frequency of Communication with Friends and Family: Three times a week    Frequency of Social Gatherings with Friends and Family: Once a week    Attends Religious Services: Patient declined    Database administrator or Organizations: No    Attends Engineer, structural: Not on file    Marital Status:  Never married  Intimate Partner Violence: Not At Risk (04/12/2023)   Humiliation, Afraid, Rape, and Kick questionnaire    Fear of Current or Ex-Partner: No    Emotionally Abused: No    Physically Abused: No    Sexually Abused: No    Review of Systems:    Constitutional: No weight loss, fever, chills, weakness or fatigue HEENT: Eyes: No change in vision               Ears, Nose, Throat:  No change in hearing or congestion Skin: No rash or itching Cardiovascular: No chest pain, chest pressure or palpitations   Respiratory: No SOB or cough Gastrointestinal: See HPI and otherwise negative Genitourinary: No dysuria or change in urinary frequency Neurological: No headache, dizziness or syncope Musculoskeletal: No new muscle or joint pain Hematologic: No bleeding or bruising Psychiatric: No history of depression or anxiety    Physical Exam:  Vital signs: BP 130/72   Wt 167 lb (75.8 kg)   BMI 23.29 kg/m   Constitutional: NAD, Well developed, Well nourished, alert and cooperative Head:  Normocephalic and atraumatic. Eyes:   PEERL, EOMI. No icterus. Conjunctiva pink. Respiratory: Respirations even and unlabored. Lungs clear to auscultation bilaterally.   No wheezes, crackles, or rhonchi.  Cardiovascular:  Regular rate and rhythm. No peripheral edema, cyanosis or pallor.  Gastrointestinal:  Soft, nondistended, nontender. No rebound or guarding. Normal bowel sounds. No appreciable masses or hepatomegaly. Rectal:  Not performed.  Msk:  Symmetrical without gross deformities. Without edema, no deformity or joint abnormality.  Neurologic:  Alert and  oriented x4;  grossly normal neurologically.  Skin:   Dry and intact without significant lesions or rashes. Psychiatric: Oriented to person, place and time. Demonstrates good judgement and reason without abnormal affect or behaviors.   RELEVANT LABS AND IMAGING: CBC    Component Value Date/Time   WBC 6.4 04/25/2023 1631   RBC 3.94 (L)  04/25/2023 1631   HGB 12.1 (L) 04/25/2023 1631   HGB 14.6 08/30/2021 1430   HCT 36.2 (L) 04/25/2023 1631   HCT 41.3 08/30/2021 1430   PLT 382.0 04/25/2023 1631   PLT 277 08/30/2021 1430   MCV 91.7 04/25/2023 1631   MCV 90 08/30/2021 1430   MCH 29.4 04/13/2023 0318   MCHC 33.3 04/25/2023 1631   RDW 13.7 04/25/2023 1631   RDW 12.8 08/30/2021  1430   LYMPHSABS 2.3 04/25/2023 1631   MONOABS 0.6 04/25/2023 1631   EOSABS 0.5 04/25/2023 1631   BASOSABS 0.1 04/25/2023 1631    CMP     Component Value Date/Time   NA 138 04/13/2023 0318   NA 142 08/30/2021 1430   K 3.8 04/13/2023 0318   CL 106 04/13/2023 0318   CO2 24 04/13/2023 0318   GLUCOSE 106 (H) 04/13/2023 0318   BUN <5 (L) 04/13/2023 0318   BUN 13 08/30/2021 1430   CREATININE 0.52 (L) 04/13/2023 0318   CALCIUM 8.9 04/13/2023 0318   PROT 6.7 04/13/2023 0318   ALBUMIN 3.8 04/13/2023 0318   AST 19 04/13/2023 0318   ALT 30 04/13/2023 0318   ALKPHOS 58 04/13/2023 0318   BILITOT 0.3 04/13/2023 0318   GFRNONAA >60 04/13/2023 0318     Assessment/Plan:   Chronic recurrent Pancreatitis Persistent abdominal discomfort and back pain, likely related to ongoing inflammation and possible cyst. Recent CT scans show worsening pancreatitis with enlarging cyst and PD dilation of 9mm. Patient has abstained from alcohol for two months. -- Repeat imaging (MRCP) in mid December which is 4-6 weeks from last CT scan -- based on MRCP results, may need to consider EUS -- Creon 72,000 with meals and 36,000 with snacks -- Bentyl for pain management. -- Test for C. diff infection due to recent hospitalizations to r/o infectious etiology of diarrhea (though likely more related to pancreatic insufficiency) -- CBC, CMP, Lipase  Weight Loss Significant weight loss since July, likely related to pancreatitis and dietary changes. --Start Creon  Warfarin Use Patient on warfarin, which will need to be held for EUS procedure. -Coordinate with  cardiologist to hold warfarin for five days prior to EUS.  General Health Maintenance -Continue alcohol abstinence. -Encourage patient to maintain a diet suitable for pancreatitis, avoiding high-fat foods. -Check hemoglobin and lipase levels today. -Follow up with patient in one week regarding effectiveness of Creon and Bentyl.      Boone Master, [name] [facility] 04/29/2023, 11:00 AM  Cc: [name], NP

## 2023-04-29 NOTE — Patient Instructions (Addendum)
Your provider has requested that you go to the basement level for lab work before leaving today. Press "B" on the elevator. The lab is located at the first door on the left as you exit the elevator.   We have sent the following medications to your pharmacy for you to pick up at your convenience: Creon  Bentyl   You will be contacted by [facility] Scheduling in the next 2 days to arrange a MRCP for the first two weeks of December.  The number on your caller ID will be [phone], please answer when they call.  If you have not heard from them in 2 days please call [phone] to schedule.     Your provider has ordered "Diatherix" stool testing for you. You have received a kit from our office today containing all necessary supplies to complete this test. Please carefully read the stool collection instructions provided in the kit before opening the accompanying materials. In addition, be sure to place the label from the top right corner of the laboratory request sheet onto the "puritan opti-swab" tube that is supplied in the kit. This label should include your full name and date of birth. After completing the test, you should secure the purtian tube into the specimen biohazard bag. The laboratory request information sheet (including date and time of specimen collection) should be placed into the outside pocket of the specimen biohazard bag and returned to the [place] lab with 2 days of collection.   If the laboratory information sheet specimen date and time are not filled out, the test will NOT be performed.  Due to recent changes in healthcare laws, you may see the results of your imaging and laboratory studies on MyChart before your provider has had a chance to review them.  We understand that in some cases there may be results that are confusing or concerning to you. Not all laboratory results come back in the same time frame and the provider may be waiting for multiple results in order to  interpret others.  Please give [place] 48 hours in order for your provider to thoroughly review all the results before contacting the office for clarification of your results.    I appreciate the  opportunity to care for you  Thank You   [facility]

## 2023-05-03 ENCOUNTER — Telehealth: Payer: Self-pay

## 2023-05-03 NOTE — Telephone Encounter (Signed)
Pt called states GI is doing an upper endoscopy/ultrasound on Wednesday 05/08/23 which just got scheduled and they have instructed him to hold Warfarin x 5 days prior to the procedure.  They did not mention pt bridging with Lovenox, but pt is familiar with Lovenox and has had to bridge in the past.  Pt called stating he is supposed to start holding today and wanted to make sure this was ok.  Pt states last time he bridged with Lovenox he had trouble getting rx filled and had to go to multiple pharmacies and is concerned if he needs to bridge he is not going to be able to do so right away.  Advised pt I would route this message to our pharmacists and have them review his chart and contact him back with their recommendations.

## 2023-05-03 NOTE — Telephone Encounter (Signed)
Called and spoke with patient. Has On-x valve and procedure was over a year ago. Ok to hold warfarin for 5 days with no bridge.

## 2023-05-03 NOTE — Progress Notes (Signed)
Addendum: Reviewed and agree with assessment and management plan. Kadijah Shamoon M, MD  

## 2023-05-08 ENCOUNTER — Telehealth: Payer: Self-pay

## 2023-05-08 ENCOUNTER — Ambulatory Visit (HOSPITAL_COMMUNITY)
Admission: RE | Admit: 2023-05-08 | Discharge: 2023-05-08 | Disposition: A | Discharge status: Home / Self Care | Payer: Commercial Managed Care - PPO | Source: Ambulatory Visit | Attending: Gastroenterology | Admitting: Gastroenterology

## 2023-05-08 ENCOUNTER — Other Ambulatory Visit: Payer: Self-pay | Admitting: Gastroenterology

## 2023-05-08 DIAGNOSIS — K861 Other chronic pancreatitis: Secondary | ICD-10-CM | POA: Diagnosis present

## 2023-05-08 MED ORDER — GADOBUTROL 1 MMOL/ML IV SOLN
7.5000 mL | Freq: Once | INTRAVENOUS | Status: AC | PRN
Start: 2023-05-08 — End: 2023-05-08
  Administered 2023-05-08: 7.5 mL via INTRAVENOUS

## 2023-05-08 NOTE — Telephone Encounter (Signed)
Pt called and states he thought he was having an endoscopy today, was instructed to hold 5 days prior (see note on 11/22); however, it was not an endoscopy and he just had an MRI. Pt asking how he should resume his Warfarin. Advised pt to take 2 tablets today and 2 tablets tomorrow and then continue regular dose. Schedule pt a coumadin clinic appt on 04/16/23 at 10:30am to have INR rechecked. Pt verbalized understanding.

## 2023-05-13 ENCOUNTER — Ambulatory Visit: Payer: Commercial Managed Care - PPO | Admitting: Gastroenterology

## 2023-05-16 ENCOUNTER — Ambulatory Visit: Payer: Commercial Managed Care - PPO | Attending: Internal Medicine | Admitting: *Deleted

## 2023-05-16 DIAGNOSIS — Z952 Presence of prosthetic heart valve: Secondary | ICD-10-CM | POA: Diagnosis not present

## 2023-05-16 DIAGNOSIS — Z5181 Encounter for therapeutic drug level monitoring: Secondary | ICD-10-CM

## 2023-05-16 LAB — POCT INR: POC INR: 1.5

## 2023-05-16 NOTE — Patient Instructions (Signed)
Description   Continue taking warfarin 4.5mg  daily except for 6mg  on Mondays.  Continue eating 5 serving of greens weekly.  Coumadin Clinic [phone] Recheck INR in 5 weeks.

## 2023-05-17 ENCOUNTER — Telehealth: Payer: Self-pay

## 2023-05-17 ENCOUNTER — Other Ambulatory Visit: Payer: Self-pay

## 2023-05-17 DIAGNOSIS — K861 Other chronic pancreatitis: Secondary | ICD-10-CM

## 2023-05-17 NOTE — Telephone Encounter (Signed)
EUS has been set up for 08/01/23 at 115 pm at [facility] with GM   Left message on machine to call back   Coumadin letter sent to cardiology

## 2023-05-17 NOTE — Telephone Encounter (Signed)
Will send to pharmD again - suspect this has already been addressed in Nov. Unclear if this is a duplicate. On-x valve.

## 2023-05-17 NOTE — Telephone Encounter (Signed)
-----   Message from Nurse [name] [name] sent at 05/17/2023  1:13 PM EST ----- [name] [name],  Can you  help with getting this patient scheduled for EUS in February? If you can help with that, I can call and discuss with pt.   Thank you! ----- Message ----- From: [name] [name] Sent: 05/17/2023   1:03 PM EST To: [name] Chiquito, RN  Please inform patient that we have had our pancreatic specialist review his MRI and it shows that the cyst is in resolution phase.  However a patient with recurrent pancreatitis may have other reasons for developing recurrent pancreatitis like an issue with the pancreatic duct.  This is best to be evaluated with an EUS which we can schedule in February.  Continue to refrain from alcohol.  Hopefully his Creon is helping. ----- Message ----- From: [name] Fiedler, MD Sent: 05/17/2023  12:23 PM EST To: [name] Como, [name]; #  It seems reasonable to schedule EUS in February Thanks for your help Dr. Meridee [name] JMP ----- Message ----- From: [name] Lofty., MD Sent: 05/15/2023   4:16 PM EST To: [name] Como, [name]; [name] Fiedler, MD  BMM, Thanks for reaching out. Reviewed the MRI/MRCP as well as the previous CT scans. This cyst is in the resolution phase.  And would not require cystgastrostomy.  Most of the time these cysts for an appropriate cystgastrostomy to be performed in place need to be a larger than 5 to 6 cm otherwise if they have decreased in size it is just in the process of healing for the body.  1 could also query however since back in 2023 he had a cyst in that area, would be whether this could be some sort of cystic neoplasm (unlikely based on his history). If he is not drinking alcohol any further, he will still be at risk of recurrent pancreatitis as a result of his chronic pancreatitis changes that are already present.  With the PD being dilated, 1 queries whether he has developed a stricture in that particular area or less likely  since no imaging is showing a subtle mass or lesion, though most likely this is a result of a PD stricture and sequela of chronic pancreatitis.  That can also cause patients to have recurrent pancreatitis develop in future if they are not drinking alcohol. I think an endoscopic ultrasound makes sense for this patient, timing based on how the patient is doing overall.  I unfortunately do not have availability until February at this point in time so if it is felt that he needs something sooner rather than later he will need to be referred outside. The PD dilation is certainly new compared to 2023. [name] [name] ----- Message ----- From: [name] Como, [name] Sent: 05/15/2023   2:18 PM EST To: [name] Lofty., MD  Hey there, Dr. Meridee [name]! Hope you had a happy thanksgiving. Sorry to bother you. Could you please review this MRCP and evaluate if you feel the cyst is mature enough for drainage or if there is anything we can do for this nice gentleman? Thanks in advance. ----- Message ----- From: Interface, Rad Results In Sent: 05/08/2023   1:18 PM EST To: [name] Como, [name]

## 2023-05-17 NOTE — Telephone Encounter (Signed)
[facility] Pre-operative Risk Assessment     Request for surgical clearance:     Endoscopy Procedure  What type of surgery is being performed?     EUS  When is this surgery scheduled?     08/01/23  What type of clearance is required ?   Pharmacy  Are there any medications that need to be held prior to surgery and how long? Coumadin  Practice name and name of physician performing surgery?      [facility]  What is your office phone and fax number?      Phone- [phone]  Fax- [phone]  Anesthesia type (None, local, MAC, general) ?       MAC   Please route your response to Hilma Favors RN

## 2023-05-20 NOTE — Telephone Encounter (Signed)
Left message on machine to call back  

## 2023-05-20 NOTE — Telephone Encounter (Signed)
Patient with diagnosis of OnX aortic valve on warfarin for anticoagulation.    Procedure: EUS Date of procedure: 08/01/23  Per office protocol, patient can hold warfarin for 5 days prior to procedure.    Patient will NOT need bridging with Lovenox (enoxaparin) around procedure.  **This guidance is not considered finalized until pre-operative APP has relayed final recommendations.**

## 2023-05-20 NOTE — Telephone Encounter (Signed)
   Name: [name] [name]  DOB: 1990-10-28  MRN: [id number]   Primary Cardiologist: Orbie Pyo, MD  Per Pharm D, patient may hold warfarin for 5 days prior to procedure. Patient will not need bridging with Lovenox (enoxaparin) around procedure.     I will route this recommendation to the requesting party via Epic fax function and remove from pre-op pool. Please call with questions.  Carlos Levering, NP 05/20/2023, 1:13 PM

## 2023-05-21 ENCOUNTER — Other Ambulatory Visit: Payer: Self-pay | Admitting: Family

## 2023-05-21 ENCOUNTER — Encounter: Payer: Self-pay | Admitting: Family

## 2023-05-21 DIAGNOSIS — K861 Other chronic pancreatitis: Secondary | ICD-10-CM

## 2023-05-21 MED ORDER — METHOCARBAMOL 500 MG PO TABS: 500.0000 mg | ORAL_TABLET | Freq: Four times a day (QID) | ORAL | 0 refills | Status: DC

## 2023-05-21 NOTE — Telephone Encounter (Signed)
Ok to hold warfarin 5 days prior to procedure per cardiology see note in [facility]

## 2023-05-21 NOTE — Telephone Encounter (Signed)
EUS scheduled, pt instructed and medications reviewed.  Patient instructions mailed to home.  Patient to call with any questions or concerns. He will hold coumadin 5 days prior

## 2023-05-21 NOTE — Telephone Encounter (Signed)
Contacted pt and scheduled for Thursday.

## 2023-05-21 NOTE — Telephone Encounter (Signed)
Sent to front office staff to call pt to schedule.

## 2023-05-21 NOTE — Telephone Encounter (Signed)
Spoke with pt, made pt aware of Stephanie's recommendations, pt awaiting c/b from our office to schedule visit.

## 2023-05-22 ENCOUNTER — Encounter: Payer: Self-pay | Admitting: Family

## 2023-05-22 ENCOUNTER — Telehealth: Payer: Commercial Managed Care - PPO | Admitting: Family

## 2023-05-22 DIAGNOSIS — K861 Other chronic pancreatitis: Secondary | ICD-10-CM

## 2023-05-22 MED ORDER — OXYCODONE HCL 5 MG PO TABS: 5.0000 mg | ORAL_TABLET | Freq: Four times a day (QID) | ORAL | 0 refills | Status: DC | PRN

## 2023-05-22 NOTE — Progress Notes (Signed)
MyChart Video Visit    Virtual Visit via Video Note   This format is felt to be most appropriate for this patient at this time. Physical exam was limited by quality of the video and audio technology used for the visit. CMA was able to get the patient set up on a video visit.  Patient location: Home. Patient and provider in visit Provider location: Office  I discussed the limitations of evaluation and management by telemedicine and the availability of in person appointments. The patient expressed understanding and agreed to proceed.  Visit Date: 05/22/2023  Today's healthcare provider: [name], NP     Subjective:   Patient ID: [name] [name], male    DOB: 28-Apr-1991, 32 y.o.   MRN: [id number]  Chief Complaint  Patient presents with   idopathic chronic pancreatitis   Discussed the use of AI scribe software for clinical note transcription with the patient, who gave verbal consent to proceed.  History of Present Illness   The patient, with a history of chronic abdominal pain, reports a recent flare-up of pain, rating it as a six out of ten. The pain tends to worsen at night and after eating. He reports feeling slightly better this morning, but is concerned that the pain will increase again. He has been managing the pain with his current medication regimen, but the recent flare-up has been more difficult to control. He attributes the recent exacerbation to overeating during a family gathering. He has been avoiding alcohol as advised. He is scheduled for an EGD in February.     Assessment & Plan:     Chronic pancreatitis -  Pain severity fluctuating, reaching up to 6/10, worse at night and after eating. Recent flare-up likely due to overeating during a family gathering. Needing to manage pain until EGD in Feb. -Sending refill of Oxycodone 5mg  q6h prn, #10 each. -Increase Methocarbamol to two tablets as needed, with caution for potential drowsiness. -Pt  advised he will need virtual or office with each OXY refill.     Past Medical History:  Diagnosis Date   Asthma    Headache    migraines   Heart murmur    Hypokalemia 06/08/2020   Leukocytosis 01/23/2022   Mild aortic stenosis    Pancreatitis    Pneumonia    as a child x several   Substance abuse [facility])     Past Surgical History:  Procedure Laterality Date   AORTIC VALVE REPLACEMENT N/A 09/27/2021   Procedure: AORTIC VALVE REPLACEMENT (AVR) USING ON-X AORTIC VALVE SIZE ;  Surgeon: Loreli Slot, MD;  Location: [facility] OR;  Service: Open Heart Surgery;  Laterality: N/A;   INGUINAL HERNIA REPAIR Right 01/05/2022   Procedure: OPEN REPAIR INCARCERATED RIGHT INGUINAL HERNIA WITH MESH;  Surgeon: [name] Adu, MD;  Location: [facility] ORS;  Service: General;  Laterality: Right;  90 MIN - ROOM 2   INGUINAL HERNIA REPAIR Left 12/20/2022   Procedure: OPEN REPAIR LEFT INGUINAL HERNIA WITH MESH;  Surgeon: [name] Adu, MD;  Location: [facility] ORS;  Service: General;  Laterality: Left;   REPLACEMENT ASCENDING AORTA N/A 09/27/2021   Procedure: REPLACEMENT ASCENDING AND INNOMINATE ANEURYSM USING HEMASHIELD PLATINUM [id number];  Surgeon: Loreli Slot, MD;  Location: [facility] OR;  Service: Open Heart Surgery;  Laterality: N/A;   RIGHT HEART CATH AND CORONARY ANGIOGRAPHY N/A 09/07/2021   Procedure: RIGHT HEART CATH AND CORONARY ANGIOGRAPHY;  Surgeon: Orbie Pyo, MD;  Location: [facility] INVASIVE CV LAB;  Service: Cardiovascular;  Laterality: N/A;   TEE WITHOUT CARDIOVERSION N/A 09/27/2021   Procedure: TRANSESOPHAGEAL ECHOCARDIOGRAM (TEE);  Surgeon: Loreli Slot, MD;  Location: [facility] OR;  Service: Open Heart Surgery;  Laterality: N/A;    Outpatient Medications Prior to Visit  Medication Sig Dispense Refill   aspirin EC 81 MG EC tablet Take 1 tablet (81 mg total) by mouth daily. Swallow whole. 30 tablet 11   dicyclomine (BENTYL) 10 MG capsule Take 2 capsules (20 mg total) by mouth 4 (four) times  daily -  before meals and at bedtime. 120 capsule 1   lipase/protease/amylase (CREON) 36000 UNITS CPEP capsule Take 2 capsules (72,000 Units total) by mouth 3 (three) times daily before meals. And 1 capsule with snacks 180 capsule 3   loratadine (CLARITIN) 10 MG tablet Take 10 mg by mouth daily.     METAMUCIL FIBER PO Take 10 mLs by mouth every other day.     methocarbamol (ROBAXIN) 500 MG tablet Take 1-2 tablets (500-1,000 mg total) by mouth 4 (four) times daily. 30 tablet 0   metoprolol succinate (TOPROL XL) 25 MG 24 hr tablet Take 0.5 tablets (12.5 mg total) by mouth at bedtime. 45 tablet 3   ondansetron (ZOFRAN) 4 MG tablet Take 1 tablet (4 mg total) by mouth every 8 (eight) hours as needed for nausea or vomiting. 20 tablet 0   oxyCODONE (ROXICODONE) 5 MG immediate release tablet Take 1 tablet (5 mg total) by mouth every 6 (six) hours as needed for moderate pain (pain score 4-6) or severe pain (pain score 7-10). 6 tablet 0   pantoprazole (PROTONIX) 20 MG tablet Take 1 tablet (20 mg total) by mouth daily. Take 1 pill twice a day for 2 weeks, then 1 pill every morning. 45 tablet 1   polyethylene glycol (MIRALAX / GLYCOLAX) 17 g packet Take 17 g by mouth daily as needed for mild constipation. 14 each 0   warfarin (COUMADIN) 3 MG tablet TAKE 1 AND 1/2 TABLETS BY MOUTH DAILY. EXCEPT TAKE 2 TABLETS ON MONDAYS, WEDNESDAYS AND FRIDAYS OR AS DIRECTED BY COAGULATION CLINIC (Patient taking differently: Take 3 mg by mouth See admin instructions. TAKE 1 AND 1/2 TABLETS BY MOUTH DAILY. EXCEPT TAKE 2 TABLETS ON MONDAYS, WEDNESDAYS AND FRIDAYS OR AS DIRECTED BY COAGULATION CLINIC) 45 tablet 3   No facility-administered medications prior to visit.    Allergies  Allergen Reactions   Amoxicillin Hives       Objective:   Physical Exam Vitals and nursing note reviewed.  Constitutional:      General: Pt is not in acute distress.    Appearance: Normal appearance.  HENT:     Head: Normocephalic.   Pulmonary:     Effort: No respiratory distress.  Musculoskeletal:     Cervical back: Normal range of motion.  Skin:    General: Skin is dry.     Coloration: Skin is not pale.  Neurological:     Mental Status: Pt is alert and oriented to person, place, and time.  Psychiatric:        Mood and Affect: Mood normal.   Ht 5\' 11"  (1.803 m)   BMI 23.29 kg/m   Wt Readings from Last 3 Encounters:  04/29/23 167 lb (75.8 kg)  04/25/23 165 lb 6.4 oz (75 kg)  04/11/23 165 lb (74.8 kg)      I discussed the assessment and treatment plan with the patient. The patient was provided an opportunity to ask questions and all were answered. The patient  agreed with the plan and demonstrated an understanding of the instructions.   The patient was advised to call back or seek an in-person evaluation if the symptoms worsen or if the condition fails to improve as anticipated.  [name], NP [facility] at [facility] [phone] (phone) [phone] (fax)  [facility]

## 2023-05-22 NOTE — Telephone Encounter (Signed)
Noted. Thank You.

## 2023-05-23 ENCOUNTER — Telehealth: Payer: Commercial Managed Care - PPO | Admitting: Family

## 2023-06-02 ENCOUNTER — Other Ambulatory Visit: Payer: Self-pay | Admitting: Gastroenterology

## 2023-06-20 ENCOUNTER — Ambulatory Visit: Payer: Commercial Managed Care - PPO | Attending: Cardiovascular Disease | Admitting: *Deleted

## 2023-06-20 DIAGNOSIS — Z7901 Long term (current) use of anticoagulants: Secondary | ICD-10-CM | POA: Diagnosis not present

## 2023-06-20 DIAGNOSIS — Z952 Presence of prosthetic heart valve: Secondary | ICD-10-CM | POA: Diagnosis not present

## 2023-06-20 LAB — POCT INR: INR: 1.5 — AB (ref 2.0–3.0)

## 2023-06-20 NOTE — Patient Instructions (Signed)
 Description   Continue taking warfarin 4.5mg  daily except for 6mg  on Mondays.  Continue eating 5 serving of greens weekly.  [facility]in Clinic [phone] Recheck INR in 5 weeks.

## 2023-07-08 ENCOUNTER — Other Ambulatory Visit: Payer: Self-pay | Admitting: Gastroenterology

## 2023-07-09 ENCOUNTER — Other Ambulatory Visit: Payer: Self-pay | Admitting: Internal Medicine

## 2023-07-10 MED ORDER — METOPROLOL SUCCINATE ER 25 MG PO TB24: 12.5000 mg | ORAL_TABLET | Freq: Every day | ORAL | 2 refills | Status: DC

## 2023-07-25 ENCOUNTER — Ambulatory Visit: Payer: Commercial Managed Care - PPO | Attending: Cardiovascular Disease

## 2023-07-25 DIAGNOSIS — Z7901 Long term (current) use of anticoagulants: Secondary | ICD-10-CM

## 2023-07-25 DIAGNOSIS — Z952 Presence of prosthetic heart valve: Secondary | ICD-10-CM

## 2023-07-25 LAB — POCT INR: INR: 1.4 — AB (ref 2.0–3.0)

## 2023-07-25 NOTE — Patient Instructions (Signed)
Description   Take 6mg  today, then resume same dosage of warfarin 4.5mg  daily except for 6mg  on Mondays. Hold Warfarin 5 days prior to procedure on 2/20, start  holding on 07/27/23.  Resume Warfarin at previous dosage 4.5mg  daily except 6mg  on Mondays on 08/01/23 if MD states ok to do so.  Recheck Warfarin 1 week after procedure.  Continue eating 5 serving of greens weekly.  Coumadin Clinic [phone]

## 2023-07-26 ENCOUNTER — Encounter (HOSPITAL_COMMUNITY): Payer: Self-pay | Admitting: Gastroenterology

## 2023-07-26 NOTE — Progress Notes (Signed)
Attempted to obtain medical history for pre op call via telephone, unable to reach at this time. HIPAA compliant voicemail message left requesting return call to pre surgical testing department.

## 2023-08-01 ENCOUNTER — Ambulatory Visit (HOSPITAL_COMMUNITY): Payer: Commercial Managed Care - PPO | Admitting: Anesthesiology

## 2023-08-01 ENCOUNTER — Ambulatory Visit (HOSPITAL_COMMUNITY)
Admission: RE | Admit: 2023-08-01 | Discharge: 2023-08-01 | Disposition: A | Discharge status: Home / Self Care | Payer: Commercial Managed Care - PPO | Source: Ambulatory Visit | Attending: Gastroenterology | Admitting: Gastroenterology

## 2023-08-01 ENCOUNTER — Encounter (HOSPITAL_COMMUNITY): Payer: Self-pay | Admitting: Gastroenterology

## 2023-08-01 ENCOUNTER — Encounter (HOSPITAL_COMMUNITY)
Admission: RE | Disposition: A | Discharge status: Home / Self Care | Payer: Self-pay | Source: Ambulatory Visit | Attending: Gastroenterology

## 2023-08-01 ENCOUNTER — Other Ambulatory Visit: Payer: Self-pay

## 2023-08-01 DIAGNOSIS — K449 Diaphragmatic hernia without obstruction or gangrene: Secondary | ICD-10-CM | POA: Diagnosis not present

## 2023-08-01 DIAGNOSIS — K219 Gastro-esophageal reflux disease without esophagitis: Secondary | ICD-10-CM | POA: Insufficient documentation

## 2023-08-01 DIAGNOSIS — K319 Disease of stomach and duodenum, unspecified: Secondary | ICD-10-CM | POA: Insufficient documentation

## 2023-08-01 DIAGNOSIS — K314 Gastric diverticulum: Secondary | ICD-10-CM

## 2023-08-01 DIAGNOSIS — Z952 Presence of prosthetic heart valve: Secondary | ICD-10-CM | POA: Diagnosis not present

## 2023-08-01 DIAGNOSIS — K3189 Other diseases of stomach and duodenum: Secondary | ICD-10-CM

## 2023-08-01 DIAGNOSIS — K859 Acute pancreatitis without necrosis or infection, unspecified: Secondary | ICD-10-CM

## 2023-08-01 DIAGNOSIS — K5710 Diverticulosis of small intestine without perforation or abscess without bleeding: Secondary | ICD-10-CM | POA: Insufficient documentation

## 2023-08-01 DIAGNOSIS — K861 Other chronic pancreatitis: Secondary | ICD-10-CM

## 2023-08-01 DIAGNOSIS — K828 Other specified diseases of gallbladder: Secondary | ICD-10-CM | POA: Diagnosis not present

## 2023-08-01 DIAGNOSIS — K862 Cyst of pancreas: Secondary | ICD-10-CM | POA: Diagnosis not present

## 2023-08-01 DIAGNOSIS — K2289 Other specified disease of esophagus: Secondary | ICD-10-CM

## 2023-08-01 DIAGNOSIS — K8689 Other specified diseases of pancreas: Secondary | ICD-10-CM | POA: Diagnosis not present

## 2023-08-01 DIAGNOSIS — I10 Essential (primary) hypertension: Secondary | ICD-10-CM | POA: Insufficient documentation

## 2023-08-01 DIAGNOSIS — Z79899 Other long term (current) drug therapy: Secondary | ICD-10-CM | POA: Diagnosis not present

## 2023-08-01 DIAGNOSIS — J45909 Unspecified asthma, uncomplicated: Secondary | ICD-10-CM | POA: Diagnosis not present

## 2023-08-01 DIAGNOSIS — R590 Localized enlarged lymph nodes: Secondary | ICD-10-CM | POA: Diagnosis not present

## 2023-08-01 DIAGNOSIS — K829 Disease of gallbladder, unspecified: Secondary | ICD-10-CM | POA: Diagnosis not present

## 2023-08-01 DIAGNOSIS — K2970 Gastritis, unspecified, without bleeding: Secondary | ICD-10-CM

## 2023-08-01 DIAGNOSIS — R599 Enlarged lymph nodes, unspecified: Secondary | ICD-10-CM

## 2023-08-01 HISTORY — PX: FINE NEEDLE ASPIRATION: SHX5430

## 2023-08-01 HISTORY — PX: EUS: SHX5427

## 2023-08-01 HISTORY — PX: ESOPHAGOGASTRODUODENOSCOPY: SHX5428

## 2023-08-01 HISTORY — PX: BIOPSY: SHX5522

## 2023-08-01 SURGERY — UPPER ENDOSCOPIC ULTRASOUND (EUS) RADIAL
Anesthesia: Monitor Anesthesia Care

## 2023-08-01 MED ORDER — CIPROFLOXACIN IN D5W 400 MG/200ML IV SOLN
INTRAVENOUS | Status: AC
  Filled 2023-08-01: qty 200

## 2023-08-01 MED ORDER — PROPOFOL 1000 MG/100ML IV EMUL
INTRAVENOUS | Status: AC
  Filled 2023-08-01: qty 100

## 2023-08-01 MED ORDER — CIPROFLOXACIN IN D5W 400 MG/200ML IV SOLN
INTRAVENOUS | Status: DC | PRN
  Administered 2023-08-01: 400 mg via INTRAVENOUS

## 2023-08-01 MED ORDER — PROPOFOL 500 MG/50ML IV EMUL
INTRAVENOUS | Status: DC | PRN
  Administered 2023-08-01: 150 ug/kg/min via INTRAVENOUS

## 2023-08-01 MED ORDER — PROPOFOL 500 MG/50ML IV EMUL
INTRAVENOUS | Status: AC
  Filled 2023-08-01: qty 50

## 2023-08-01 MED ORDER — CIPROFLOXACIN HCL 500 MG PO TABS: 500.0000 mg | ORAL_TABLET | Freq: Two times a day (BID) | ORAL | 0 refills | Status: AC

## 2023-08-01 MED ORDER — PROPOFOL 10 MG/ML IV BOLUS
INTRAVENOUS | Status: DC | PRN
  Administered 2023-08-01: 40 mg via INTRAVENOUS
  Administered 2023-08-01: 30 mg via INTRAVENOUS

## 2023-08-01 MED ORDER — SODIUM CHLORIDE 0.9 % IV SOLN: INTRAVENOUS | Status: DC

## 2023-08-01 NOTE — Op Note (Signed)
 [facility] Patient Name: [name] [name] Procedure Date: 08/01/2023 MRN: [id number] Attending MD: Corliss Parish , MD, [id number] Date of Birth: [birth year] CSN: [id number] Age: 33 Admit Type: Outpatient Procedure:                Upper EUS Indications:              Dilated pancreatic duct on CT scan, Dilated                            pancreatic duct on MRCP, Exclusion of chronic                            pancreatitis, Acute recurrent pancreatitis Providers:                Corliss Parish, MD, Doristine Mango, RN, Alan Ripper, Technician Referring MD:              Medicines:                Monitored Anesthesia Care, Cipro 400 mg IV Complications:            No immediate complications. Estimated Blood Loss:     Estimated blood loss was minimal. Procedure:                Pre-Anesthesia Assessment:                           - Prior to the procedure, a History and Physical                            was performed, and patient medications and                            allergies were reviewed. The patient's tolerance of                            previous anesthesia was also reviewed. The risks                            and benefits of the procedure and the sedation                            options and risks were discussed with the patient.                            All questions were answered, and informed consent                            was obtained. Prior Anticoagulants: The patient has                            taken Coumadin (warfarin), last dose was 5 days  prior to procedure. ASA Grade Assessment: III - A                            patient with severe systemic disease. After                            reviewing the risks and benefits, the patient was                            deemed in satisfactory condition to undergo the                            procedure.                           After  obtaining informed consent, the endoscope was                            passed under direct vision. Throughout the                            procedure, the patient's blood pressure, pulse, and                            oxygen saturations were monitored continuously. The                            [id number]-[id number] ([id number]) Olympus endoscope was introduced                            through the mouth, and advanced to the second part                            of duodenum. The [id number]-[id number] ([id number]) Olympus                            duodenoscope was introduced through the mouth, and                            advanced to the area of papilla. The [id number]                            ([id number]) Olympus linear ultrasound scope was                            introduced through the mouth, and advanced to the                            duodenum for ultrasound examination from the                            stomach and duodenum. The upper EUS was  accomplished without difficulty. The patient                            tolerated the procedure. Scope In: Scope Out: Findings:      ENDOSCOPIC FINDING: :      No gross lesions were noted in the entire esophagus.      The Z-line was irregular and was found 40 cm from the incisors.      A 2 cm hiatal hernia was present.      A large non-bleeding diverticulum was found in the gastric fundus.      Patchy mildly erythematous mucosa without bleeding was found in the       entire examined stomach. Biopsies were taken with a cold forceps for       histology and Helicobacter pylori testing.      No gross lesions were noted in the duodenal bulb, in the first portion       of the duodenum and in the second portion of the duodenum.      A medium non-bleeding diverticulum was found in the area of the papilla.      The major papilla was normal with a larger intraduodenal portion hidden       under a hood.      ENDOSONOGRAPHIC FINDING: :      An  anechoic lesion suggestive of a cyst was identified in the pancreatic       head/genu of the pancreas. It is not in obvious communication with the       pancreatic duct. The lesion measured 44 mm by 41 mm in maximal       cross-sectional diameter. There was a single compartment without septae.       The outer wall of the lesion was thin. There was no associated mass.       There was no internal debris within the fluid-filled cavity. Diagnostic       needle aspiration for fluid was performed. Color Doppler imaging was       utilized prior to needle puncture to confirm a lack of significant       vascular structures within the needle path. One pass was made with the       22 gauge needle using a transduodenal approach. A stylet was used. The       amount of fluid collected was approximately 50 mL. The fluid was clear,       white and watery. Sample(s) were sent for glucose, amylase       concentration, cytology and CEA.      Endosonographic imaging of the pancreas showed sonographic changes       indicative of moderate-severe chronic pancreatitis in the entire       pancreas. The parenchyma had atrophy, hyperechoic foci with shadowing       and hyperechoic strands. The pancreatic duct had a 5 mm intraductal       calculi within the head of the pancreas, irregularity of contour,       dilated side-branches, duct dilation and a hyperechoic duct margin. The       pancreatic duct in the neck measured 5.4 mm, the pancreatic duct in the       body measured 10.5 -> 6.1 mm, the pancreatic duct in the tail measured       4.1 -> 2.0 mm. Due to the significant shadowing effect of the  hyperechoic foci, there is decreased sensitivity for findings of       mass/lesions in the head as result to be remembered.      There was no sign of significant endosonographic abnormality in the       common bile duct. Ducts with regular contour were identified.      A small amount of hyperechoic material consistent  with sludge was       visualized endosonographically in the gallbladder.      Endosonographic imaging of the ampulla showed no intramural       (subepithelial) lesion.      Endosonographic imaging in the visualized portion of the liver showed no       mass.      One enlarged lymph node was visualized in the porta hepatis region. It       measured 9 mm by 8 mm in maximal cross-sectional diameter. The node was       triangular, isoechoic and had well defined margins.      The celiac region was visualized. Impression:               EGD impression:                           - No gross lesions in the entire esophagus. Z-line                            irregular, 40 cm from the incisors.                           - 2 cm hiatal hernia.                           - Gastric diverticulum in the fundus.                           - Erythematous mucosa in the stomach. Biopsied.                           - No gross lesions in the duodenal bulb, in the                            first portion of the duodenum and in the second                            portion of the duodenum.                           - Non-bleeding duodenal diverticulum in the area of                            the papilla.                           - Normal major papilla with long intraduodenal                            portion hidden under a hood.  EUS impression:                           - A cystic lesion was seen in the pancreatic                            head/genu of the pancreas. Cytology results are                            pending. However, the endosonographic appearance is                            suggestive of a pancreatic pseudocyst. Fine needle                            aspiration for fluid performed, however as result                            of the patient's recurrent episodes of pancreatitis                            to ensure that this is not a mucinous cyst.                            - Endosonographic imaging of the pancreas showed                            sonographic changes consistent with moderate-severe                            chronic pancreatitis. Pancreatic duct dilation is                            noted. There appears to be a 5 mm pancreatic ductal                            stone in the area where PD dilation occurs within                            the head/neck region transition.                           - There was no sign of significant pathology in the                            common bile duct.                           - Hyperechoic material consistent with small amount                            of sludge was visualized endosonographically in the  gallbladder.                           - One enlarged lymph node was visualized in the                            porta hepatis region. Tissue has not been obtained.                            However, the endosonographic appearance is                            consistent with benign inflammatory changes. Moderate Sedation:      Not Applicable - Patient had care per Anesthesia. Recommendation:           - The patient will be observed post-procedure,                            until all discharge criteria are met.                           - Discharge patient to home.                           - Patient has a contact number available for                            emergencies. The signs and symptoms of potential                            delayed complications were discussed with the                            patient. Return to normal activities tomorrow.                            Written discharge instructions were provided to the                            patient.                           - Low fat diet for 1 week.                           - Observe patient's clinical course.                           - Await cytology results and await path results.                            - Pending that the cyst does not return as a                            mucinous cyst, and that this is a pseudocyst; there  may be some role to consider pancreatic ERCP for                            attempt at stenting and extraction/breakdown of the                            PD stone in the head. This patient has been alcohol                            free for years so it may be a reason to consider if                            he continues to have episodes of pancreatitis.                           - Recommend fecal elastase testing to ensure no                            evidence of EPI.                           -Ciprofloxacin 500 mg twice daily x 3 days to                            decrease risk of post interventional infection from                            today's aspiration.                           - May restart Coumadin on 2/21 to decrease risk of                            post interventional bleeding from today's procedure.                           - Continue present medications.                           - The findings and recommendations were discussed                            with the patient.                           - The findings and recommendations were discussed                            with the patient's family. Procedure Code(s):        --- Professional ---                           [phone], Esophagogastroduodenoscopy, flexible,  transoral; with transendoscopic ultrasound-guided                            intramural or transmural fine needle                            aspiration/biopsy(s), (includes endoscopic                            ultrasound examination limited to the esophagus,                            stomach or duodenum, and adjacent structures)                           43239, 59, Esophagogastroduodenoscopy, flexible,                            transoral; with biopsy, single or  multiple Diagnosis Code(s):        --- Professional ---                           K22.89, Other specified disease of esophagus                           K44.9, Diaphragmatic hernia without obstruction or                            gangrene                           K31.4, Gastric diverticulum                           K31.89, Other diseases of stomach and duodenum                           K86.2, Cyst of pancreas                           R59.0, Localized enlarged lymph nodes                           K86.89, Other specified diseases of pancreas                           K85.90, Acute pancreatitis without necrosis or                            infection, unspecified                           R93.3, Abnormal findings on diagnostic imaging of                            other parts of digestive tract  K57.10, Diverticulosis of small intestine without                            perforation or abscess without bleeding                           K83.8, Other specified diseases of biliary tract CPT copyright 2022 [employer]. All rights reserved. The codes documented in this report are preliminary and upon coder review may  be revised to meet current compliance requirements. Corliss Parish, MD 08/01/2023 1:31:54 PM Number of Addenda: 0

## 2023-08-01 NOTE — Transfer of Care (Signed)
 Immediate Anesthesia Transfer of Care Note  Patient: [name] [name]  Procedure(s) Performed: UPPER ENDOSCOPIC ULTRASOUND (EUS) RADIAL ESOPHAGOGASTRODUODENOSCOPY (EGD) BIOPSY FINE NEEDLE ASPIRATION (FNA) LINEAR  Patient Location: Endoscopy Unit  Anesthesia Type:MAC  Level of Consciousness: awake and patient cooperative  Airway & Oxygen Therapy: Patient Spontanous Breathing and Patient connected to face mask  Post-op Assessment: Report given to RN and Post -op Vital signs reviewed and stable  Post vital signs: Reviewed and stable  Last Vitals:  Vitals Value Taken Time  BP    Temp    Pulse 64 08/01/23 1317  Resp 15 08/01/23 1317  SpO2 100 % 08/01/23 1317  Vitals shown include unfiled device data.  Last Pain:  Vitals:   08/01/23 1158  TempSrc: Tympanic  PainSc: 0-No pain         Complications: No notable events documented.

## 2023-08-01 NOTE — Anesthesia Preprocedure Evaluation (Addendum)
 Anesthesia Evaluation  Patient identified by MRN, date of birth, ID band Patient awake    Reviewed: Allergy & Precautions, NPO status , Patient's Chart, lab work & pertinent test results  History of Anesthesia Complications Negative for: history of anesthetic complications  Airway Mallampati: II  TM Distance: >3 FB Neck ROM: Full   Comment: Previous grade I view with MAC 4, easy mask Dental  (+) Dental Advisory Given   Pulmonary neg shortness of breath, asthma (has not used inhalers in years) , neg sleep apnea, neg COPD, neg recent URI   Pulmonary exam normal breath sounds clear to auscultation       Cardiovascular hypertension (metoprolol), Pt. on home beta blockers (-) angina (-) Past MI, (-) Cardiac Stents and (-) CABG (-) dysrhythmias + Valvular Problems/Murmurs (mild AS, bicuspid aortic valve s/p AVR and ascending aorta replacement)  Rhythm:Regular Rate:Normal  TTE 12/03/2022: IMPRESSIONS    1. Left ventricular ejection fraction, by estimation, is 65 to 70%. The  left ventricle has normal function. The left ventricle has no regional  wall motion abnormalities. There is mild left ventricular hypertrophy.  Left ventricular diastolic parameters  were normal.   2. Right ventricular systolic function is normal. The right ventricular  size is normal. Tricuspid regurgitation signal is inadequate for assessing  PA pressure.   3. The mitral valve is normal in structure. Trivial mitral valve  regurgitation. No evidence of mitral stenosis.   4. The aortic valve has been repaired/replaced. Aortic valve  regurgitation is trivial. There is a 23 mm On-X mechanical valve present  in the aortic position. Echo findings are consistent with normal structure  and function of the aortic valve prosthesis.   Aortic valve mean gradient measures 11.0 mmHg.   5. Aortic root/ascending aorta has been repaired/replaced. Hemashield  graft repair of the  ascending aorta   6. The inferior vena cava is normal in size with greater than 50%  respiratory variability, suggesting right atrial pressure of 3 mmHg.   RHC 09/07/2021: 1. Normal right dominant circulation. 2. Normal cardiac output and index with with normal filling pressures with a mean PA pressure of 15 mmHg, wedge pressure of 11 mmHg, and mean right atrial pressure of 4 mmHg.      Neuro/Psych  Headaches, neg Seizures PSYCHIATRIC DISORDERS Anxiety        GI/Hepatic ,GERD  Medicated,,(+)     substance abuse (h/o)  Pancreatic cyst, chronic pancreatitis   Endo/Other  negative endocrine ROS    Renal/GU negative Renal ROS     Musculoskeletal   Abdominal   Peds  Hematology negative hematology ROS (+) Lab Results      Component                Value               Date                      WBC                      6.4                 04/25/2023                HGB                      12.1 (L)            04/25/2023  HCT                      36.2 (L)            04/25/2023                MCV                      91.7                04/25/2023                PLT                      382.0               04/25/2023              Anesthesia Other Findings Last warfarin: 07/26/2023  Reproductive/Obstetrics                             Anesthesia Physical Anesthesia Plan  ASA: 2  Anesthesia Plan: MAC   Post-op Pain Management:    Induction: Intravenous  PONV Risk Score and Plan: 1 and Ondansetron, Dexamethasone and Treatment may vary due to age or medical condition  Airway Management Planned: Natural Airway and Simple Face Mask  Additional Equipment:   Intra-op Plan:   Post-operative Plan:   Informed Consent: I have reviewed the patients History and Physical, chart, labs and discussed the procedure including the risks, benefits and alternatives for the proposed anesthesia with the patient or authorized representative who has  indicated his/her understanding and acceptance.     Dental advisory given  Plan Discussed with: CRNA and Anesthesiologist  Anesthesia Plan Comments: (Discussed with patient risks of MAC including, but not limited to, minor pain or discomfort, hearing people in the room, and possible need for backup general anesthesia. Risks for general anesthesia also discussed including, but not limited to, sore throat, hoarse voice, chipped/damaged teeth, injury to vocal cords, nausea and vomiting, allergic reactions, lung infection, heart attack, stroke, and death. All questions answered. )        Anesthesia Quick Evaluation

## 2023-08-01 NOTE — H&P (Signed)
 GASTROENTEROLOGY PROCEDURE H&P NOTE   Primary Care Physician: [name], NP  HPI: [name] [name] is a 33 y.o. male who presents for EGD/EUS for evaluation of recurrent pancreatitis, prior history of alcohol use disorder, rule out chronic pancreatitis and pancreatic stone disease and mass-lesion causing dilation of pancreatic duct.  Past Medical History:  Diagnosis Date   Asthma    Headache    migraines   Heart murmur    Hypokalemia 06/08/2020   Leukocytosis 01/23/2022   Mild aortic stenosis    Pancreatitis    Pneumonia    as a child x several   Substance abuse [facility])    Past Surgical History:  Procedure Laterality Date   AORTIC VALVE REPLACEMENT N/A 09/27/2021   Procedure: AORTIC VALVE REPLACEMENT (AVR) USING ON-X AORTIC VALVE SIZE ;  Surgeon: Loreli Slot, MD;  Location: [facility] OR;  Service: Open Heart Surgery;  Laterality: N/A;   INGUINAL HERNIA REPAIR Right 01/05/2022   Procedure: OPEN REPAIR INCARCERATED RIGHT INGUINAL HERNIA WITH MESH;  Surgeon: Gaynelle Adu, MD;  Location: [facility] ORS;  Service: General;  Laterality: Right;  90 MIN - ROOM 2   INGUINAL HERNIA REPAIR Left 12/20/2022   Procedure: OPEN REPAIR LEFT INGUINAL HERNIA WITH MESH;  Surgeon: Gaynelle Adu, MD;  Location: [facility] ORS;  Service: General;  Laterality: Left;   REPLACEMENT ASCENDING AORTA N/A 09/27/2021   Procedure: REPLACEMENT ASCENDING AND INNOMINATE ANEURYSM USING HEMASHIELD PLATINUM [id number];  Surgeon: Loreli Slot, MD;  Location: [facility] OR;  Service: Open Heart Surgery;  Laterality: N/A;   RIGHT HEART CATH AND CORONARY ANGIOGRAPHY N/A 09/07/2021   Procedure: RIGHT HEART CATH AND CORONARY ANGIOGRAPHY;  Surgeon: Orbie Pyo, MD;  Location: [facility] INVASIVE CV LAB;  Service: Cardiovascular;  Laterality: N/A;   TEE WITHOUT CARDIOVERSION N/A 09/27/2021   Procedure: TRANSESOPHAGEAL ECHOCARDIOGRAM (TEE);  Surgeon: Loreli Slot, MD;  Location: [facility] OR;  Service: Open Heart  Surgery;  Laterality: N/A;   Current Facility-Administered Medications  Medication Dose Route Frequency Provider Last Rate Last Admin   0.9 %  sodium chloride infusion   Intravenous Continuous Mansouraty, Netty Starring., MD   Continued from Pre-op at 08/01/23 1217   Facility-Administered Medications Ordered in Other Encounters  Medication Dose Route Frequency Provider Last Rate Last Admin   propofol (DIPRIVAN) 10 mg/mL bolus/IV push   Intravenous Anesthesia Intra-op Vanessa [place], CRNA   40 mg at 08/01/23 1220   propofol (DIPRIVAN) 500 MG/50ML infusion   Intravenous Continuous PRN Vanessa [place], CRNA 67.32 mL/hr at 08/01/23 1220 150 mcg/kg/min at 08/01/23 1220    Current Facility-Administered Medications:    0.9 %  sodium chloride infusion, , Intravenous, Continuous, Mansouraty, Netty Starring., MD, Continued from Pre-op at 08/01/23 1217  Facility-Administered Medications Ordered in Other Encounters:    propofol (DIPRIVAN) 10 mg/mL bolus/IV push, , Intravenous, Anesthesia Intra-op, Vanessa [place], CRNA, 40 mg at 08/01/23 1220   propofol (DIPRIVAN) 500 MG/50ML infusion, , Intravenous, Continuous PRN, Vanessa [place], CRNA, Last Rate: 67.32 mL/hr at 08/01/23 1220, 150 mcg/kg/min at 08/01/23 1220 Allergies  Allergen Reactions   Amoxicillin Hives   Family History  Problem Relation Age of Onset   Pancreatitis Maternal Uncle    Social History   Socioeconomic History   Marital status: Single    Spouse name: Not on file   Number of children: Not on file   Years of education: Not on file   Highest education level: Bachelor's degree (e.g., BA, AB, BS)  Occupational History   Occupation: Leisure centre manager    Comment: [employer]  Tobacco Use   Smoking status: Never   Smokeless tobacco: Never  Vaping Use   Vaping status: Never Used  Substance and Sexual Activity   Alcohol use: Yes    Comment: none x 1 month as od 04/11/2023   Drug use: Not Currently     Types: Other-see comments    Comment: Edibles occasionally marijuana   Sexual activity: Yes  Other Topics Concern   Not on file  Social History Narrative   Not on file   Social Drivers of Health   Financial Resource Strain: Low Risk  (04/25/2023)   Overall Financial Resource Strain (CARDIA)    Difficulty of Paying Living Expenses: Not very hard  Food Insecurity: No Food Insecurity (04/25/2023)   Hunger Vital Sign    Worried About Running Out of Food in the Last Year: Never true    Ran Out of Food in the Last Year: Never true  Transportation Needs: No Transportation Needs (04/25/2023)   PRAPARE - Administrator, Civil Service (Medical): No    Lack of Transportation (Non-Medical): No  Physical Activity: Sufficiently Active (04/25/2023)   Exercise Vital Sign    Days of Exercise per Week: 5 days    Minutes of Exercise per Session: 150+ min  Stress: No Stress Concern Present (04/25/2023)   [employer] of Occupational Health - Occupational Stress Questionnaire    Feeling of Stress : Only a little  Social Connections: Unknown (04/25/2023)   Social Connection and Isolation Panel [NHANES]    Frequency of Communication with Friends and Family: Three times a week    Frequency of Social Gatherings with Friends and Family: Once a week    Attends Religious Services: Patient declined    Active Member of Clubs or Organizations: No    Attends Engineer, structural: Not on file    Marital Status: Never married  Intimate Partner Violence: Not At Risk (04/12/2023)   Humiliation, Afraid, Rape, and Kick questionnaire    Fear of Current or Ex-Partner: No    Emotionally Abused: No    Physically Abused: No    Sexually Abused: No    Physical Exam: Today's Vitals   07/31/23 0940 08/01/23 1158  BP:  127/83  Pulse:  (!) 48  Resp:  10  Temp:  97.8 F (36.6 C)  TempSrc:  Tympanic  SpO2:  100%  Weight: 74.8 kg 74.8 kg  Height:  5\' 11"  (1.803 m)  PainSc:  0-No pain    Body mass index is 23 kg/m. GEN: NAD EYE: Sclerae anicteric ENT: MMM CV: Non-tachycardic GI: Soft, NT/ND NEURO:  Alert & Oriented x 3  Lab Results: No results for input(s): "WBC", "HGB", "HCT", "PLT" in the last 72 hours. BMET No results for input(s): "NA", "K", "CL", "CO2", "GLUCOSE", "BUN", "CREATININE", "CALCIUM" in the last 72 hours. LFT No results for input(s): "PROT", "ALBUMIN", "AST", "ALT", "ALKPHOS", "BILITOT", "BILIDIR", "IBILI" in the last 72 hours. PT/INR No results for input(s): "LABPROT", "INR" in the last 72 hours.   Impression / Plan: This is a 33 y.o.male who presents for EGD/EUS for evaluation of recurrent pancreatitis, prior history of alcohol use disorder, rule out chronic pancreatitis and pancreatic stone disease and mass-lesion causing dilation of pancreatic duct.  The risks of an EUS including intestinal perforation, bleeding, infection, aspiration, and medication effects were discussed as was the possibility it may not give a definitive diagnosis if a  biopsy is performed.  When a biopsy of the pancreas is done as part of the EUS, there is an additional risk of pancreatitis at the rate of about 1-2%.  It was explained that procedure related pancreatitis is typically mild, although it can be severe and even life threatening, which is why we do not perform random pancreatic biopsies and only biopsy a lesion/area we feel is concerning enough to warrant the risk.  The risks and benefits of endoscopic evaluation/treatment were discussed with the patient and/or family; these include but are not limited to the risk of perforation, infection, bleeding, missed lesions, lack of diagnosis, severe illness requiring hospitalization, as well as anesthesia and sedation related illnesses.  The patient's history has been reviewed, patient examined, no change in status, and deemed stable for procedure.  The patient and/or family is agreeable to proceed.    Corliss Parish,  MD [facility] Office # [phone]

## 2023-08-01 NOTE — Discharge Instructions (Signed)

## 2023-08-01 NOTE — Anesthesia Postprocedure Evaluation (Signed)
 Anesthesia Post Note  Patient: [name] [name]  Procedure(s) Performed: UPPER ENDOSCOPIC ULTRASOUND (EUS) RADIAL ESOPHAGOGASTRODUODENOSCOPY (EGD) BIOPSY FINE NEEDLE ASPIRATION (FNA) LINEAR     Patient location during evaluation: PACU Anesthesia Type: MAC Level of consciousness: awake Pain management: pain level controlled Vital Signs Assessment: post-procedure vital signs reviewed and stable Respiratory status: spontaneous breathing, nonlabored ventilation and respiratory function stable Cardiovascular status: stable and blood pressure returned to baseline Postop Assessment: no apparent nausea or vomiting Anesthetic complications: no   No notable events documented.  Last Vitals:  Vitals:   08/01/23 1321 08/01/23 1330  BP: (!) 119/39 122/70  Pulse: (!) 57 (!) 58  Resp: 17 13  Temp:    SpO2: 98% 100%    Last Pain:  Vitals:   08/01/23 1321  TempSrc:   PainSc: 0-No pain                 Linton Rump

## 2023-08-04 ENCOUNTER — Encounter (HOSPITAL_COMMUNITY): Payer: Self-pay | Admitting: Gastroenterology

## 2023-08-05 LAB — SURGICAL PATHOLOGY

## 2023-08-06 ENCOUNTER — Encounter: Payer: Self-pay | Admitting: Gastroenterology

## 2023-08-06 LAB — CYTOLOGY - NON PAP

## 2023-08-08 ENCOUNTER — Ambulatory Visit: Payer: Commercial Managed Care - PPO | Attending: Cardiology

## 2023-08-08 DIAGNOSIS — Z952 Presence of prosthetic heart valve: Secondary | ICD-10-CM | POA: Diagnosis not present

## 2023-08-08 DIAGNOSIS — Z7901 Long term (current) use of anticoagulants: Secondary | ICD-10-CM

## 2023-08-08 LAB — POCT INR: INR: 1.5 — AB (ref 2.0–3.0)

## 2023-08-08 NOTE — Patient Instructions (Signed)
 Description   Continue on same dosage of Warfarin 4.5mg  daily except 6mg  on Mondays. Recheck INR 4 weeks. Continue eating 5 serving of greens weekly.  [facility] [phone]

## 2023-08-13 ENCOUNTER — Encounter: Payer: Self-pay | Admitting: Gastroenterology

## 2023-08-13 NOTE — Progress Notes (Signed)
 The pt has been advised and will be contacted when all results are available with next steps. The pt has been advised of the information and verbalized understanding.

## 2023-08-13 NOTE — Progress Notes (Signed)
 [facility] Results  Cyst fluid Nondiagnostic  CEA 1.0 ng/mL Amylase 59658 U/L Glucose 35 mg/dL  Additional testing for PancraGen molecular analysis is pending at this time.  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  I think that we will see the testing and then decide upon repeat imaging vs discussion with surgical colleagues the need for further evaluation.  Corliss Parish, MD [facility] # [phone]

## 2023-08-21 ENCOUNTER — Telehealth: Payer: Self-pay | Admitting: Internal Medicine

## 2023-08-21 ENCOUNTER — Other Ambulatory Visit: Payer: Self-pay | Admitting: Internal Medicine

## 2023-08-21 DIAGNOSIS — Z952 Presence of prosthetic heart valve: Secondary | ICD-10-CM

## 2023-08-21 NOTE — Telephone Encounter (Signed)
 Prescription refill request received for warfarin Lov: 02/15/23 [name] [name])  Next INR check: 09/05/23 Warfarin tablet strength: 3mg  Appropriate dose. Refill sent.

## 2023-08-21 NOTE — Telephone Encounter (Signed)
*  STAT* If patient is at the pharmacy, call can be transferred to refill team.   1. Which medications need to be refilled? (please list name of each medication and dose if known) warfarin (COUMADIN) 3 MG tablet   2. Which pharmacy/location (including street and city if local pharmacy) is medication to be sent to? [id number] - [place], [place] - [address]   3. Do they need a 30 day or 90 day supply? 90

## 2023-08-21 NOTE — Telephone Encounter (Signed)
 Warfarin 3mg  refill S/P AVR (aortic valve replacement) and aortoplasty [Z95.2]  Last INR 08/08/23 Last OV 02/15/23  Warfarin refill sent to same pharmacy. This is a duplicate.

## 2023-08-23 NOTE — Progress Notes (Signed)
 [facility] PancraGEN pancreatic fluid analysis  CEA 1.0 ng/mL Amylase 59,658 units/L Glucose 35 mg/dL Cytology = acellular DNA quantity = moderate DNA quality = poor quality (degraded) KRAS oncogene = no amplification GNAS oncogene = no amplification Tumor suppressor gene = no amplification  Overall the biological behavior based on the testing would suggest that any 97% probability of benign disease over the next 3 years.  The low glucose does still suggest the possibility of a mucinous process though the overall clinical picture was more suggestive of a pseudocyst.  Will have the patient perform a CA 19-9.  If the CA 19-9 is normal, we will likely plan a repeat imaging study with MRI/MRCP in 6 to 12 months.  If the CA 19-9 is elevated, we will plan a discussion at [facility] and potential referral to pancreaticobiliary surgery.  We will have these results scanned into the chart and also mailed to the patient for his records.  I will forward this to the patient's primary gastroenterology team as well.  Corliss Parish, MD [facility] # [phone]

## 2023-08-26 ENCOUNTER — Other Ambulatory Visit: Payer: Self-pay

## 2023-08-26 ENCOUNTER — Other Ambulatory Visit

## 2023-08-26 ENCOUNTER — Telehealth: Payer: Self-pay | Admitting: Gastroenterology

## 2023-08-26 DIAGNOSIS — K861 Other chronic pancreatitis: Secondary | ICD-10-CM

## 2023-08-26 NOTE — Progress Notes (Signed)
 The pt has returned call and aware of the results. He agrees to the lab- order has been entered. The pt will come in today or tomorrow to complete.

## 2023-08-26 NOTE — Progress Notes (Signed)
 Left message on machine to call back

## 2023-08-26 NOTE — Telephone Encounter (Signed)
 Inbound call from patient stating he received a call this morning but unsure who it was from. Please advise, thank you.

## 2023-08-26 NOTE — Progress Notes (Signed)
Attempted to reach pt.again left message

## 2023-08-26 NOTE — Telephone Encounter (Signed)
See 3/17 phone note

## 2023-08-27 LAB — CANCER ANTIGEN 19-9: CA 19-9: 6 U/mL (ref <34)

## 2023-09-05 ENCOUNTER — Ambulatory Visit: Payer: Commercial Managed Care - PPO | Attending: Cardiovascular Disease | Admitting: *Deleted

## 2023-09-05 DIAGNOSIS — Z5181 Encounter for therapeutic drug level monitoring: Secondary | ICD-10-CM

## 2023-09-05 DIAGNOSIS — Z7901 Long term (current) use of anticoagulants: Secondary | ICD-10-CM

## 2023-09-05 DIAGNOSIS — Z952 Presence of prosthetic heart valve: Secondary | ICD-10-CM

## 2023-09-05 LAB — POCT INR: POC INR: 1.2

## 2023-09-05 NOTE — Patient Instructions (Signed)
 Description    Take 2.5 tablets of warfarin today Then START taking warfarin 1.5 tablets daily except  2 tablets on Monday and Thursdays. Recheck INR in 2 weeks. Continue eating 5 serving of greens weekly.  [facility] [phone]

## 2023-09-09 ENCOUNTER — Ambulatory Visit (INDEPENDENT_AMBULATORY_CARE_PROVIDER_SITE_OTHER): Admitting: Family

## 2023-09-09 ENCOUNTER — Other Ambulatory Visit (HOSPITAL_COMMUNITY)
Admission: RE | Admit: 2023-09-09 | Discharge: 2023-09-09 | Disposition: A | Discharge status: Home / Self Care | Source: Ambulatory Visit | Attending: Family | Admitting: Family

## 2023-09-09 ENCOUNTER — Encounter: Payer: Self-pay | Admitting: Family

## 2023-09-09 VITALS — BP 110/67 | HR 83 | Temp 97.8°F | Ht 71.0 in | Wt 170.6 lb

## 2023-09-09 DIAGNOSIS — K861 Other chronic pancreatitis: Secondary | ICD-10-CM

## 2023-09-09 DIAGNOSIS — Z113 Encounter for screening for infections with a predominantly sexual mode of transmission: Secondary | ICD-10-CM | POA: Diagnosis present

## 2023-09-09 DIAGNOSIS — Z1159 Encounter for screening for other viral diseases: Secondary | ICD-10-CM | POA: Diagnosis not present

## 2023-09-09 DIAGNOSIS — Z0000 Encounter for general adult medical examination without abnormal findings: Secondary | ICD-10-CM

## 2023-09-09 NOTE — Progress Notes (Signed)
 Phone: [phone]  Subjective:  Patient 33 y.o. male presenting for annual physical.  Chief Complaint  Patient presents with   Annual Exam    Non fasting w/labs   Discussed the use of AI scribe software for clinical note transcription with the patient, who gave verbal consent to proceed.  History of Present Illness The patient, with a history of pancreatic issues, reports overall improvement in symptoms since the last visit. He has experienced minor flare-ups since the beginning of the year, but these have been manageable with Epsom salt baths and mentho carbomer for back pain. The patient has been managing his diet carefully, often opting for fruit and nutritional drinks instead of solid food when he feels a flare-up coming on. He reports that his pancreas is holding up and his doctor has suggested that with continued good management of diet, his pancreas may return to normal within the next two to three years. The patient also mentions a fluctuating weight, which he attributes to an inconsistent appetite. He reports extreme hunger on some days and has developed a sweet tooth, which is a new development. Despite these changes, the patient has been able to maintain a weight between 160 and 170 pounds. The patient also discusses insurance issues related to a cancer screening test. He expresses frustration with his insurance's unwillingness to cover the test, given the seriousness of pancreatic cancer. He plans to request an appeal in hopes that the test will be approved. The patient has been exercising regularly, two to three times a week, and has quit alcohol. He also mentions taking Creon with meals and has found it manageable in terms of cost and side effects.  See problem oriented charting- ROS- full  review of systems was completed and negative except for: what is noted in HPI above.  The following were reviewed and entered/updated in epic: Past Medical History:  Diagnosis Date    Asthma    Headache    migraines   Heart murmur    Hypokalemia 06/08/2020   Leukocytosis 01/23/2022   Mild aortic stenosis    Pancreatitis    Pneumonia    as a child x several   Substance abuse [facility])    Patient Active Problem List   Diagnosis Date Noted   Dilation of pancreatic duct 08/01/2023   Pancreatic cyst 08/01/2023   Gastritis and gastroduodenitis 08/01/2023   Acute pancreatitis 04/11/2023   Pseudocyst of pancreas 04/03/2023   Gastroesophageal reflux disease without esophagitis 03/05/2023   Situational anxiety 03/05/2023   Chronic pancreatitis (HCC) 01/23/2022   Subtherapeutic international normalized ratio (INR) 01/23/2022   Essential hypertension 01/23/2022   Abnormal CT of the abdomen 01/23/2022   Long term (current) use of anticoagulants 10/04/2021   S/P AVR (aortic valve replacement) and aortoplasty 09/27/2021   Unilateral inguinal hernia without obstruction or gangrene 07/28/2021   Transaminitis 06/08/2020   Aortic valve calcification 06/08/2020   Mild aortic stenosis 11/01/2016   Bicuspid aortic valve 11/01/2016   Past Surgical History:  Procedure Laterality Date   AORTIC VALVE REPLACEMENT N/A 09/27/2021   Procedure: AORTIC VALVE REPLACEMENT (AVR) USING ON-X AORTIC VALVE SIZE ;  Surgeon: Loreli Slot, MD;  Location: [facility] OR;  Service: Open Heart Surgery;  Laterality: N/A;   BIOPSY  08/01/2023   Procedure: BIOPSY;  Surgeon: [name], MD;  Location: [facility];  Service: Gastroenterology;;   ESOPHAGOGASTRODUODENOSCOPY N/A 08/01/2023   Procedure: ESOPHAGOGASTRODUODENOSCOPY (EGD);  Surgeon: [name], MD;  Location: [facility];  Service: Gastroenterology;  Laterality: N/A;   EUS N/A 08/01/2023   Procedure: UPPER ENDOSCOPIC ULTRASOUND (EUS) RADIAL;  Surgeon: [name], MD;  Location: [facility];  Service: Gastroenterology;  Laterality: N/A;   FINE NEEDLE ASPIRATION N/A 08/01/2023   Procedure: FINE NEEDLE  ASPIRATION (FNA) LINEAR;  Surgeon: [name], MD;  Location: [facility];  Service: Gastroenterology;  Laterality: N/A;   INGUINAL HERNIA REPAIR Right 01/05/2022   Procedure: OPEN REPAIR INCARCERATED RIGHT INGUINAL HERNIA WITH MESH;  Surgeon: Gaynelle Adu, MD;  Location: [facility] ORS;  Service: General;  Laterality: Right;  90 MIN - ROOM 2   INGUINAL HERNIA REPAIR Left 12/20/2022   Procedure: OPEN REPAIR LEFT INGUINAL HERNIA WITH MESH;  Surgeon: Gaynelle Adu, MD;  Location: [facility] ORS;  Service: General;  Laterality: Left;   REPLACEMENT ASCENDING AORTA N/A 09/27/2021   Procedure: REPLACEMENT ASCENDING AND INNOMINATE ANEURYSM USING HEMASHIELD PLATINUM [id number];  Surgeon: Loreli Slot, MD;  Location: [facility] OR;  Service: Open Heart Surgery;  Laterality: N/A;   RIGHT HEART CATH AND CORONARY ANGIOGRAPHY N/A 09/07/2021   Procedure: RIGHT HEART CATH AND CORONARY ANGIOGRAPHY;  Surgeon: Orbie Pyo, MD;  Location: [facility];  Service: Cardiovascular;  Laterality: N/A;   TEE WITHOUT CARDIOVERSION N/A 09/27/2021   Procedure: TRANSESOPHAGEAL ECHOCARDIOGRAM (TEE);  Surgeon: Loreli Slot, MD;  Location[facility] OR;  Service: Open Heart Surgery;  Laterality: N/A;    Family History  Problem Relation Age of Onset   Pancreatitis Maternal Uncle     Medications- reviewed and updated Current Outpatient Medications  Medication Sig Dispense Refill   aspirin EC 81 MG EC tablet Take 1 tablet (81 mg total) by mouth daily. Swallow whole. 30 tablet 11   dicyclomine (BENTYL) 10 MG capsule Take 2 capsules (20 mg total) by mouth 4 (four) times daily -  before meals and at bedtime. 360 capsule 1   lipase/protease/amylase (CREON) 36000 UNITS CPEP capsule Take 2 capsules (72,000 Units total) by mouth 3 (three) times daily before meals. And 1 capsule with snacks 180 capsule 3   loratadine (CLARITIN) 10 MG tablet Take 10 mg by mouth daily.     methocarbamol (ROBAXIN) 500 MG tablet Take 1-2  tablets (500-1,000 mg total) by mouth 4 (four) times daily. 30 tablet 0   metoprolol succinate (TOPROL XL) 25 MG 24 hr tablet Take 0.5 tablets (12.5 mg total) by mouth at bedtime. 45 tablet 2   ondansetron (ZOFRAN) 4 MG tablet Take 1 tablet (4 mg total) by mouth every 8 (eight) hours as needed for nausea or vomiting. 20 tablet 0   warfarin (COUMADIN) 3 MG tablet TAKE 1 1/2 TABLETS TO 2 TABLETS BY MOUTH DAILY OR AS DIRECTED BY COAGULATION CLINIC 140 tablet 0   No current facility-administered medications for this visit.    Allergies-reviewed and updated Allergies  Allergen Reactions   Amoxicillin Hives    Social History   Social History Narrative   Not on file    Objective:  BP 110/67 (BP Location: Left Arm, Patient Position: Sitting, Cuff Size: Large)   Pulse 83   Temp 97.8 F (36.6 C) (Temporal)   Ht 5\' 11"  (1.803 m)   Wt 170 lb 9.6 oz (77.4 kg)   SpO2 95%   BMI 23.79 kg/m  Physical Exam Vitals and nursing note reviewed.  Constitutional:      General: He is not in acute distress.    Appearance: Normal appearance.  HENT:     Head: Normocephalic.  Right Ear: Tympanic membrane and external ear normal.     Left Ear: Tympanic membrane and external ear normal.     Nose: Nose normal.     Mouth/Throat:     Mouth: Mucous membranes are moist.  Eyes:     Extraocular Movements: Extraocular movements intact.  Cardiovascular:     Rate and Rhythm: Normal rate and regular rhythm.  Pulmonary:     Effort: Pulmonary effort is normal.     Breath sounds: Normal breath sounds.  Abdominal:     General: Abdomen is flat. There is no distension.     Palpations: Abdomen is soft.     Tenderness: There is no abdominal tenderness.  Musculoskeletal:        General: Normal range of motion.     Cervical back: Normal range of motion.  Skin:    General: Skin is warm and dry.  Neurological:     Mental Status: He is alert and oriented to person, place, and time.  Psychiatric:        Mood  and Affect: Mood normal.        Behavior: Behavior normal.        Judgment: Judgment normal.     Assessment and Plan   Health Maintenance counseling: 1. Anticipatory guidance: Patient counseled regarding regular dental exams q6 months, eye exams yearly, avoiding smoking and second hand smoke, limiting alcohol to 2 beverages per day.   2. Risk factor reduction:  Advised patient of need for regular exercise and diet rich in fruits and vegetables to reduce risk of heart attack and stroke.    Wt Readings from Last 3 Encounters:  09/09/23 170 lb 9.6 oz (77.4 kg)  08/01/23 164 lb 14.5 oz (74.8 kg)  04/29/23 167 lb (75.8 kg)   3. Immunizations/screenings/ancillary studies Immunization History  Administered Date(s) Administered   Influenza, Seasonal, Injecte, Preservative Fre 04/13/2023   Influenza,inj,Quad PF,6+ Mos 07/28/2021   Influenza-Unspecified 03/12/2019   There are no preventive care reminders to display for this patient.   4. Skin cancer screening-  advised regular sunscreen use. Denies worrisome, changing, or new skin lesions.  5. Smoking associated screening: non- smoker 6. STD screening - N/A 7. Alcohol screening: none  Assessment & Plan Chronic Pancreatitis - Chronic pancreatitis with manageable symptoms. Reports two minor flare-ups since January, with manageable abdominal and back pain. Back pain relieved with Epsom salt baths and menthol carbomer. Adheres to a diet that minimizes the need for Creon, and the pancreas is reportedly well-managed. The gastroenterologist is optimistic about potential resolution in 2-3 years with continued dietary management. Concerned about insurance coverage for pancreatic cancer screening, which is not currently covered. Weight fluctuates between 160-170 pounds, likely due to inconsistent appetite. Reports some days of extreme hunger and a persistent sweet tooth, which is a new development. Weight today is stable within a healthy BMI range. -  Continue current dietary management to minimize symptoms. -Continue taking Creon with meals as directed. - Encourage appeal for insurance coverage of pancreatic cancer screening - Avoid alcohol consumption permanently - Monitor weight and appetite - Encourage consistent dietary habits -Continue to follow with GI prn  General Health Maintenance - Routine health maintenance discussed. Blood pressure is slightly low but not concerning. Regular exercise is maintained 2-3 times a week. No alcohol consumption. Brushing and flossing regularly. Bowel movements are regular with occasional gas managed by dietary adjustments and Gas-X. TSH was checked last year and will be deferred this year. - Order CBC, complete  metabolic panel, cholesterol panel, and hepatitis C screening - Perform STD testing as requested - Encourage regular exercise, healthy diet, and dental hygiene - Review lab results by the end of the week and communicate via patient portal  Recommended follow up: Return for any future concerns, Complete physical w/fasting labs. Future Appointments  Date Time Provider Department Center  09/19/2023  9:45 AM [facility] None   Lab/Order associations: non- fasting  [name], NP

## 2023-09-09 NOTE — Assessment & Plan Note (Addendum)
 Chronic pancreatitis with manageable symptoms. Reports two minor flare-ups since January, with manageable abdominal and back pain. Back pain relieved with Epsom salt baths and menthol carbomer. Adheres to a diet that minimizes the need for Creon, and the pancreas is reportedly well-managed. The gastroenterologist is optimistic about potential resolution in 2-3 years with continued dietary management. Concerned about insurance coverage for pancreatic cancer screening, which is not currently covered. Weight fluctuates between 160-170 pounds, likely due to inconsistent appetite. Reports some days of extreme hunger and a persistent sweet tooth, which is a new development. Weight today is stable within a healthy BMI range. - Continue current dietary management to minimize symptoms. -Continue taking Creon with meals as directed. - Encourage appeal for insurance coverage of pancreatic cancer screening - Avoid alcohol consumption permanently - Monitor weight and appetite - Encourage consistent dietary habits

## 2023-09-09 NOTE — Patient Instructions (Addendum)
 It was very nice to see you today!   I will review your lab results via MyChart in a few days.   You look great! Stay well and enjoy the Spring season!    PLEASE NOTE:  If you had any lab tests please let us know if you have not heard back within a few days. You may see your results on MyChart before we have a chance to review them but we will give you a call once they are reviewed by [place]. If we ordered any referrals today, please let us know if you have not heard from their office within the next week.

## 2023-09-10 LAB — LIPID PANEL
Cholesterol: 188 mg/dL (ref 0–200)
HDL: 46.6 mg/dL (ref >39.00)
LDL Cholesterol: 101 mg/dL — ABNORMAL HIGH (ref 0–99)
NonHDL: 141.62
Total CHOL/HDL Ratio: 4
Triglycerides: 205 mg/dL — ABNORMAL HIGH (ref 0.0–149.0)
VLDL: 41 mg/dL — ABNORMAL HIGH (ref 0.0–40.0)

## 2023-09-10 LAB — COMPREHENSIVE METABOLIC PANEL WITH GFR
ALT: 21 U/L (ref 0–53)
AST: 19 U/L (ref 0–37)
Albumin: 4.8 g/dL (ref 3.5–5.2)
Alkaline Phosphatase: 49 U/L (ref 39–117)
BUN: 15 mg/dL (ref 6–23)
CO2: 30 meq/L (ref 19–32)
Calcium: 9.5 mg/dL (ref 8.4–10.5)
Chloride: 105 meq/L (ref 96–112)
Creatinine, Ser: 0.82 mg/dL (ref 0.40–1.50)
GFR: 116.12 mL/min (ref >60.00)
Glucose, Bld: 65 mg/dL — ABNORMAL LOW (ref 70–99)
Potassium: 4.1 meq/L (ref 3.5–5.1)
Sodium: 143 meq/L (ref 135–145)
Total Bilirubin: 0.2 mg/dL (ref 0.2–1.2)
Total Protein: 7.5 g/dL (ref 6.0–8.3)

## 2023-09-10 LAB — CBC WITH DIFFERENTIAL/PLATELET
Basophils Absolute: 0 10*3/uL (ref 0.0–0.1)
Basophils Relative: 0.6 % (ref 0.0–3.0)
Eosinophils Absolute: 0.5 10*3/uL (ref 0.0–0.7)
Eosinophils Relative: 8.1 % — ABNORMAL HIGH (ref 0.0–5.0)
HCT: 37.1 % — ABNORMAL LOW (ref 39.0–52.0)
Hemoglobin: 12.3 g/dL — ABNORMAL LOW (ref 13.0–17.0)
Lymphocytes Relative: 28.8 % (ref 12.0–46.0)
Lymphs Abs: 1.7 10*3/uL (ref 0.7–4.0)
MCHC: 33.2 g/dL (ref 30.0–36.0)
MCV: 89.4 fl (ref 78.0–100.0)
Monocytes Absolute: 0.5 10*3/uL (ref 0.1–1.0)
Monocytes Relative: 9 % (ref 3.0–12.0)
Neutro Abs: 3.2 10*3/uL (ref 1.4–7.7)
Neutrophils Relative %: 53.5 % (ref 43.0–77.0)
Platelets: 322 10*3/uL (ref 150.0–400.0)
RBC: 4.15 Mil/uL — ABNORMAL LOW (ref 4.22–5.81)
RDW: 14.8 % (ref 11.5–15.5)
WBC: 5.9 10*3/uL (ref 4.0–10.5)

## 2023-09-10 LAB — HEPATITIS C ANTIBODY: Hepatitis C Ab: NONREACTIVE

## 2023-09-11 LAB — URINE CYTOLOGY ANCILLARY ONLY
Chlamydia: NEGATIVE
Comment: NEGATIVE
Comment: NEGATIVE
Comment: NORMAL
Neisseria Gonorrhea: NEGATIVE
Trichomonas: NEGATIVE

## 2023-09-16 ENCOUNTER — Encounter: Payer: Self-pay | Admitting: Family

## 2023-09-19 ENCOUNTER — Ambulatory Visit: Attending: Cardiovascular Disease

## 2023-09-25 ENCOUNTER — Other Ambulatory Visit: Payer: Self-pay | Admitting: Family

## 2023-09-25 DIAGNOSIS — K861 Other chronic pancreatitis: Secondary | ICD-10-CM

## 2023-09-25 MED ORDER — METHOCARBAMOL 500 MG PO TABS: 500.0000 mg | ORAL_TABLET | Freq: Four times a day (QID) | ORAL | 0 refills | Status: DC

## 2023-10-25 ENCOUNTER — Other Ambulatory Visit: Payer: Self-pay | Admitting: Gastroenterology

## 2023-10-25 IMAGING — CT CT ANGIO CHEST
3 of 9 series · 18 of 46 positions shown · IV contrast (OMNIPAQUE 350)
Comparison: None.

CLINICAL DATA: 30-year-old male with history of aortic stenosis.
Evaluate for aortic aneurysm.

EXAM:
CT ANGIOGRAPHY CHEST WITH CONTRAST
TECHNIQUE: Multidetector CT imaging of the chest was performed using the
standard protocol during bolus administration of intravenous
contrast. Multiplanar CT image reconstructions and MIPs were
obtained to evaluate the vascular anatomy.
RADIATION DOSE REDUCTION: This exam was performed according to the
departmental dose-optimization program which includes automated
exposure control, adjustment of the mA and/or kV according to
patient size and/or use of iterative reconstruction technique.
CONTRAST:  100 mL , intravenous

[Series 4: aorta 3.0 bf37 2 · axial · 0.80mm/px · z∈[-342,-52]mm · 12 of 115 slices shown]
[im 9/115  lung]
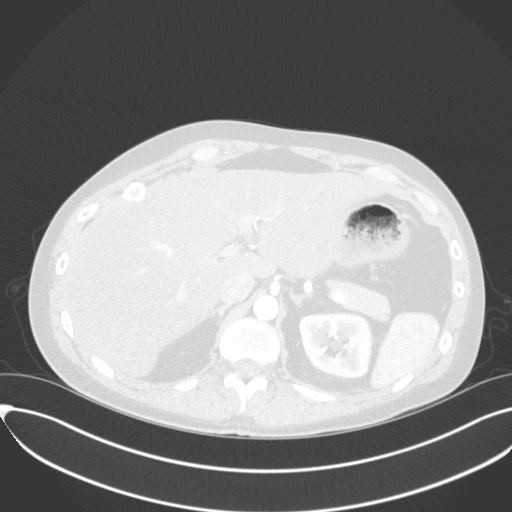
[im 18/115  soft-tissue]
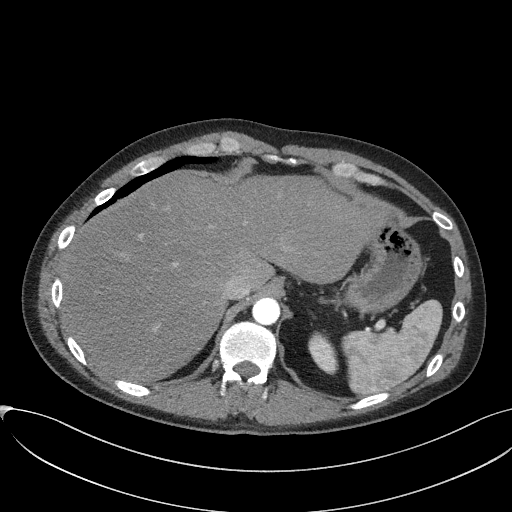
[im 27/115  lung]
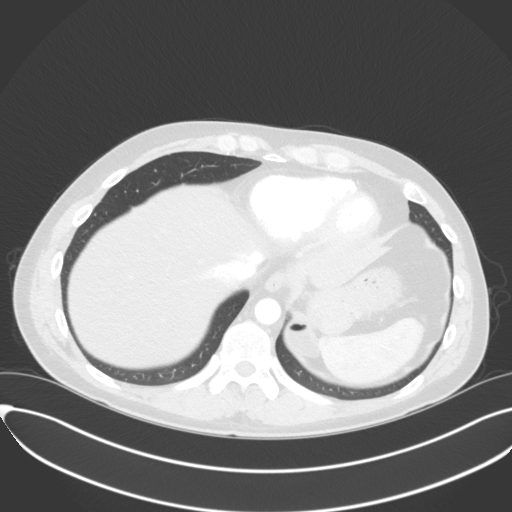
[im 36/115  soft-tissue]
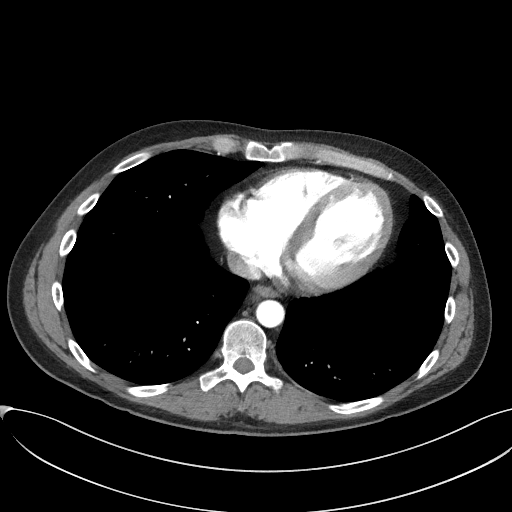
[im 44/115  lung]
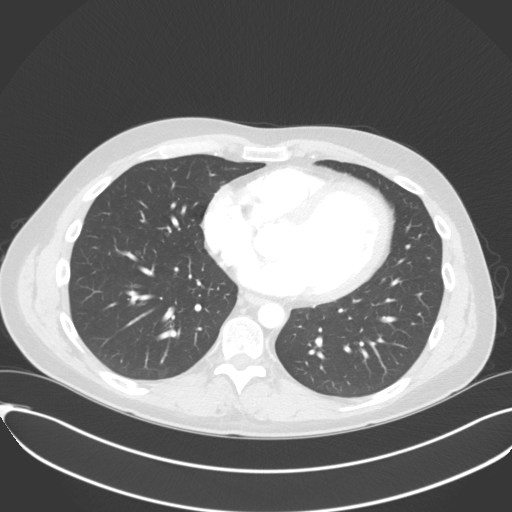
[im 53/115  soft-tissue]
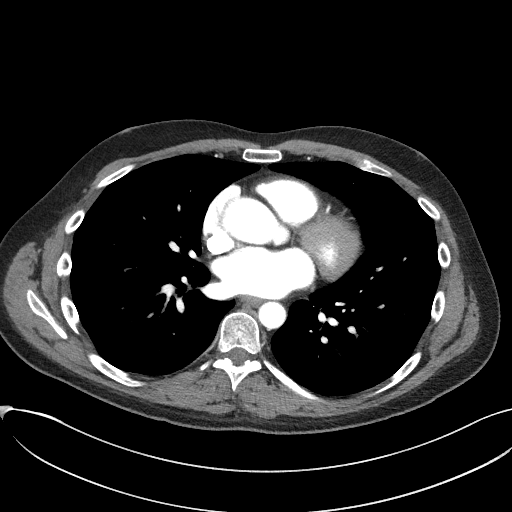
[im 62/115  lung]
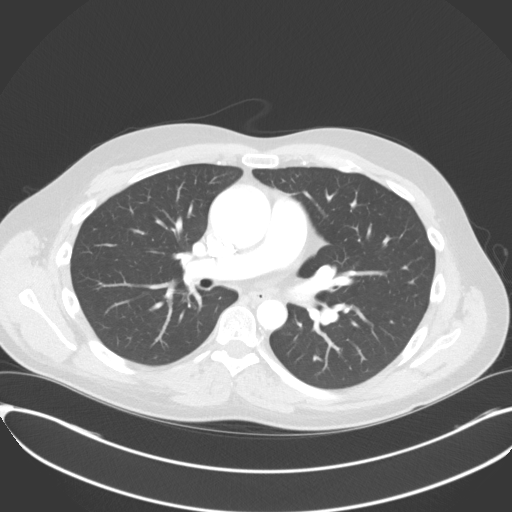
[im 71/115  soft-tissue]
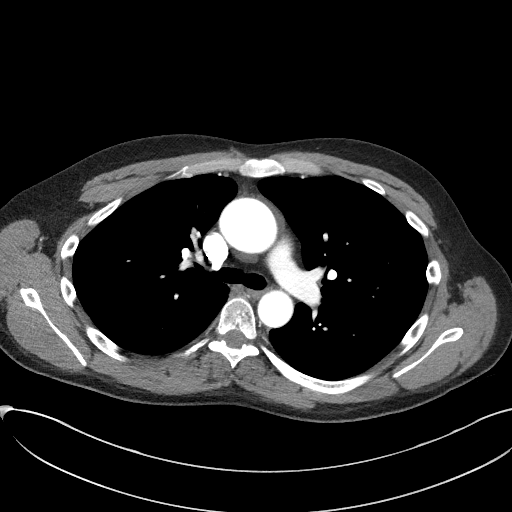
[im 79/115  lung]
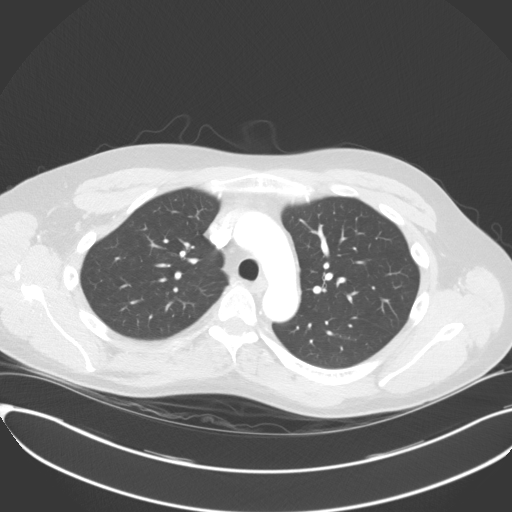
[im 88/115  soft-tissue]
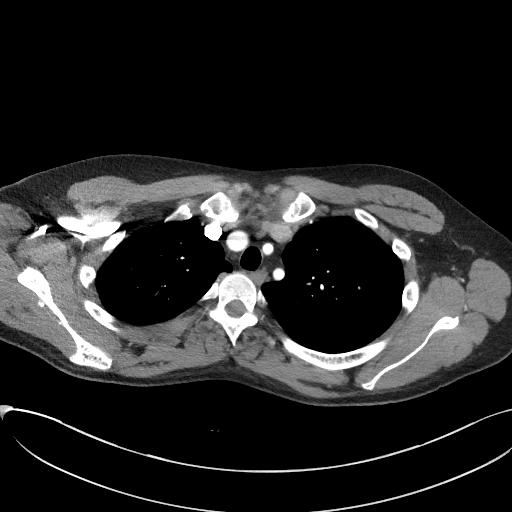
[im 97/115  lung]
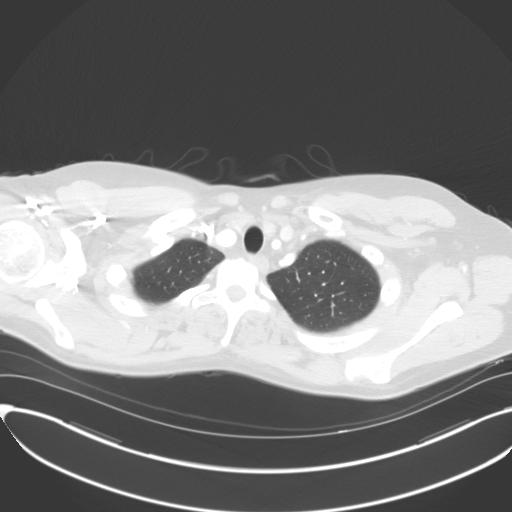
[im 106/115  soft-tissue]
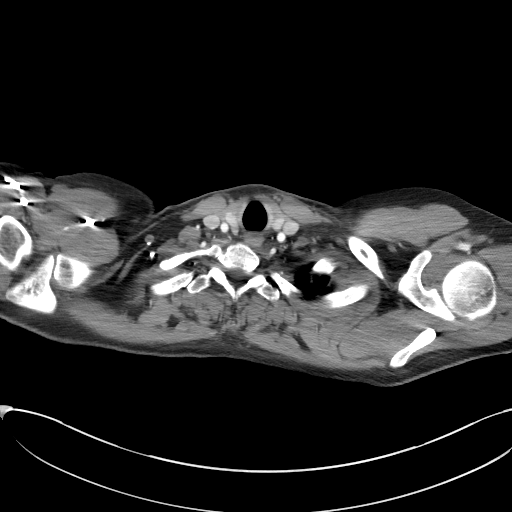

[Series 5: lung · axial · 0.80mm/px · z∈[-342,-262]mm · 3 of 115 slices shown]
[im 9/115  soft-tissue]
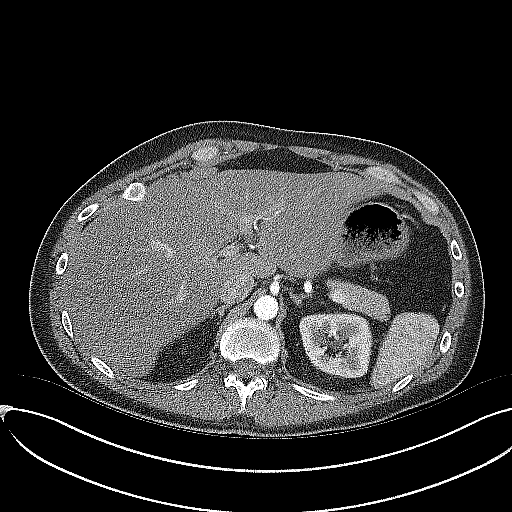
[im 27/115  soft-tissue]
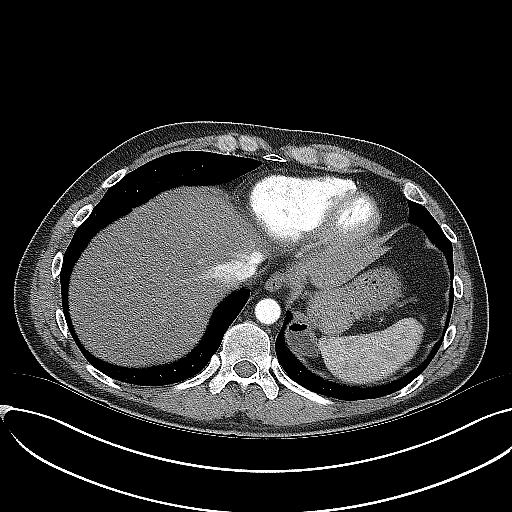
[im 36/115  soft-tissue]
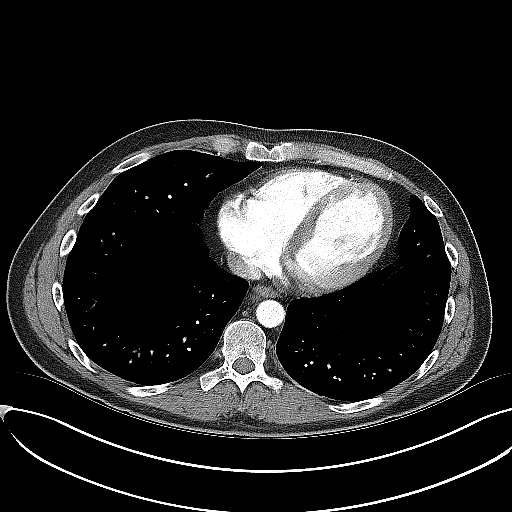

[Series 7: coronals · coronal · 0.67mm/px · 3 of 110 slices shown]
[im 28/110  soft-tissue]
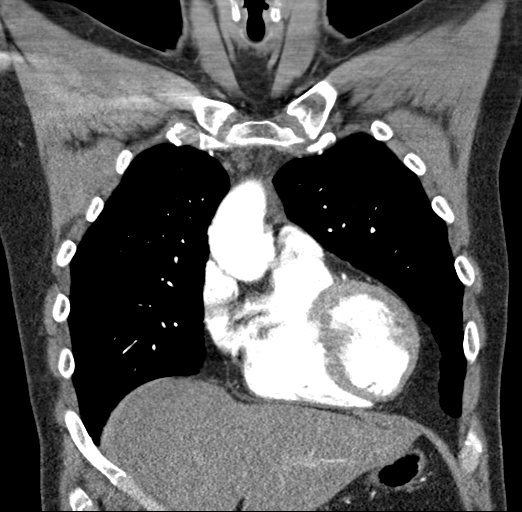
[im 55/110  soft-tissue]
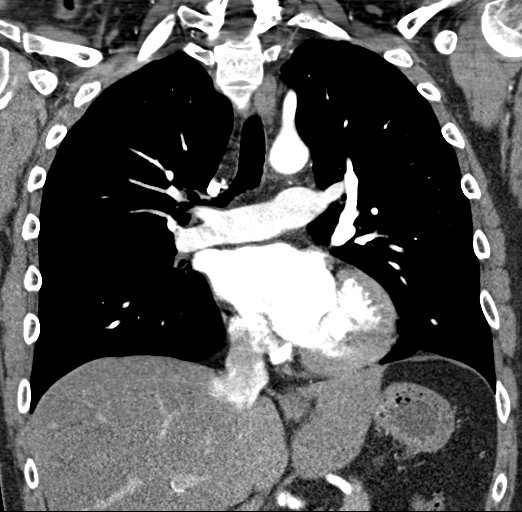
[im 82/110  soft-tissue]
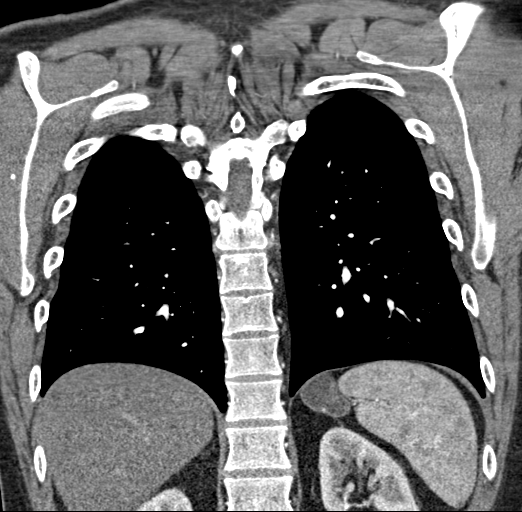

[18 of 46 positions shown; findings below may reference images not displayed]

FINDINGS: Cardiovascular: Preferential opacification of the thoracic aorta. No
evidence of thoracic aortic aneurysm or dissection. Normal heart
size. No pericardial effusion.

Sinues of Valsalva: Bicuspid appearance with scattered
calcifications measuring no greater than 37 mm in maximum short axis
diameter.

Sinotubular Junction: 33 mm

Ascending Aorta: 45 mm

Aortic Arch: 40 mm

Descending aorta: 27 mm at the level of the carina ,unchanged

Branch vessels: Conventional branching pattern. No significant
atherosclerotic changes.

Coronary arteries: Normal origins and courses. No significant
atherosclerotic calcifications.

Main pulmonary artery: 28 mm 8 that. No evidence of central
pulmonary embolism.

Pulmonary veins: No anomalous pulmonary venous return. No evidence
of left atrial appendage thrombus.

Upper abdominal vasculature: Within normal limits.

Mediastinum/Nodes: No enlarged mediastinal, hilar, or axillary lymph
nodes. Thyroid gland, trachea, and esophagus demonstrate no
significant findings.

Lungs/Pleura: No focal consolidations. No suspicious pulmonary
nodules. No pleural effusion or pneumothorax.

Upper Abdomen: Diffuse decreased attenuation of the hepatic
parenchyma. No focal masses. The remaining visualized upper abdomen
is within normal limits.

Musculoskeletal: No chest wall abnormality. No acute or significant
osseous findings.

Review of the MIP images confirms the above findings.
IMPRESSION: Vascular:

1. Fusiform ascending thoracic aortic aneurysm measuring up to
cm. Recommend semi-annual imaging followup by CTA or MRA and
referral to cardiothoracic surgery if not already obtained. This
recommendation follows 8171
ACCF/AHA/AATS/ACR/ASA/SCA/HERMELINDO/GEILO/BOTTOMS/DEEQA RAYAAN Guidelines for the
Diagnosis and Management of Patients With Thoracic Aortic Disease.
Circulation. 8171; 121: [id number]. Aortic aneurysm NOS ([id number])
2. Ill-defined but bicuspid appearance of the aortic valve with
scattered calcifications.

Non-Vascular:

1. No acute intrathoracic abnormality.
2. Moderate hepatic steatosis.

## 2023-11-19 ENCOUNTER — Other Ambulatory Visit: Payer: Self-pay

## 2023-11-19 ENCOUNTER — Encounter: Payer: Self-pay | Admitting: Family

## 2023-11-19 DIAGNOSIS — K861 Other chronic pancreatitis: Secondary | ICD-10-CM

## 2023-11-19 MED ORDER — METHOCARBAMOL 500 MG PO TABS: 500.0000 mg | ORAL_TABLET | Freq: Four times a day (QID) | ORAL | 0 refills | Status: AC

## 2023-11-22 ENCOUNTER — Other Ambulatory Visit: Payer: Self-pay | Admitting: Gastroenterology

## 2023-11-25 ENCOUNTER — Encounter: Payer: Self-pay | Admitting: Internal Medicine

## 2023-11-26 ENCOUNTER — Telehealth: Payer: Self-pay | Admitting: Gastroenterology

## 2023-11-26 ENCOUNTER — Other Ambulatory Visit: Payer: Self-pay

## 2023-11-26 DIAGNOSIS — K861 Other chronic pancreatitis: Secondary | ICD-10-CM

## 2023-11-26 MED ORDER — PANCRELIPASE (LIP-PROT-AMYL) 36000-114000 UNITS PO CPEP: 36000.0000 [IU] | ORAL_CAPSULE | Freq: Three times a day (TID) | ORAL | 2 refills | Status: DC

## 2023-11-26 NOTE — Telephone Encounter (Signed)
 I called the pt and he requested the medication be sent to the [facility] on [address]. Pt is aware and understands.

## 2023-11-26 NOTE — Telephone Encounter (Signed)
 Patient called and stated that his Creon  was sent to [facility] but [facility] can not [facility] due to them not having creon  in stock. Patient is requesting is we can send his creon  over to a different 24 hour pharmacy in [place]. Patient also stated that if we have another alternative access to that medication, because other wise he will not be able to eat. Patient is requesting a call back. Please advise.

## 2023-12-02 ENCOUNTER — Ambulatory Visit (INDEPENDENT_AMBULATORY_CARE_PROVIDER_SITE_OTHER): Admitting: Family

## 2023-12-02 ENCOUNTER — Encounter: Payer: Self-pay | Admitting: Family

## 2023-12-02 VITALS — BP 137/77 | HR 103 | Temp 98.8°F | Ht 71.0 in | Wt 154.0 lb

## 2023-12-02 DIAGNOSIS — K861 Other chronic pancreatitis: Secondary | ICD-10-CM

## 2023-12-02 DIAGNOSIS — R634 Abnormal weight loss: Secondary | ICD-10-CM

## 2023-12-02 NOTE — Progress Notes (Signed)
 Patient ID: [name] [name], male    DOB: 02-06-1991, 33 y.o.   MRN: [id number]  Chief Complaint  Patient presents with   Weight Loss    Concerned about the rapid weight loss   Abdominal Pain    Started on Thursday, was radiating to back but no longer. No issued with constipation or urination. Pain has died down a lot but still troublesome  Discussed the use of AI scribe software for clinical note transcription with the patient, who gave verbal consent to proceed.  History of Present Illness [name] [name] Darlyn is a 33 year old male who presents with weight loss and abdominal pain.  He experiences severe abdominal pain with nausea but no vomiting. Methocarbamol  alleviates back pain, and he manages abdominal pain if it remains below six out of ten. Zofran  is available for nausea but is rarely used. Appetite is significantly decreased, leading to substantial weight loss. He has had only five full meals in the past week and often goes two to three days without eating, primarily due to pain. He has lost about 40 pounds since last year and feels he has no more weight to lose. Energy levels are low, and he walks about 30 miles a week at work.  He takes Creon  before meals, which he believes helps, but has not identified specific food triggers. There is a delayed reaction to foods, sometimes days after consumption. He abstains from alcohol. He recalls a hospitalization period in October and November and has been managing his condition with medication since then.  He uses Bentyl  for pain management, which he finds helpful, but is cautious about constipation. There have been no recent changes in medication doses. His demanding work schedule often leaves him with only ten minutes to himself during a ten-hour workday, affecting his eating schedule. He typically eats one meal a day around 4 PM, supplemented by fruit and nutritional drinks earlier in the day. During a two-week  vacation, he did not experience any pain or dietary issues, suggesting a possible link between his work schedule and symptoms.  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 Managed with Creon , methocarbamol , and dicyclomine . Pancreatic cyst and stone present; GI did not recommend surgery & recommended 39m f/u w/repeat endoscopy in 65m. Advised earlier GI follow-up due to frequency of recurring symptoms and weight loss. - Advised to call GI for follow-up due to weight loss and ongoing symptoms. - Continue Creon  before meals. - Continue methocarbamol  for abd & back pain. - Continue dicyclomine  for abdominal pain. - Sending nutritional consultation for dietary management. - Encourage maintaining hydration and monitoring weight.  Weight loss Significant weight loss of 40 pounds over the past year due to decreased appetite and pain from pancreatic insufficiency. Work schedule may exacerbate condition. - Encouraged f/u with GI office - Refer to nutritionist for dietary management and meal planning. - Consider adjusting work schedule to allow for regular meals.    Subjective:    Outpatient Medications Prior to Visit  Medication Sig Dispense Refill   aspirin  EC 81 MG EC tablet Take 1 tablet (81 mg total) by mouth daily. Swallow whole. 30 tablet 11   dicyclomine  (BENTYL ) 10 MG capsule TAKE 2 CAPSULES(20 MG) BY MOUTH FOUR TIMES DAILY BEFORE MEALS AND AT BEDTIME 360 capsule 1   emtricitabine-tenofovir (TRUVADA) 200-300 MG tablet Take 1 tablet by mouth daily.     lipase/protease/amylase (  CREON ) 36000 UNITS CPEP capsule Take 1 capsule (36,000 Units total) by mouth 3 (three) times daily before meals. 200 capsule 2   loratadine  (CLARITIN ) 10 MG tablet Take 10 mg by mouth daily.     methocarbamol  (ROBAXIN ) 500 MG tablet Take 1-2 tablets (500-1,000 mg total) by mouth 4 (four) times  daily. 30 tablet 0   metoprolol  succinate (TOPROL  XL) 25 MG 24 hr tablet Take 0.5 tablets (12.5 mg total) by mouth at bedtime. 45 tablet 2   ondansetron  (ZOFRAN ) 4 MG tablet Take 1 tablet (4 mg total) by mouth every 8 (eight) hours as needed for nausea or vomiting. 20 tablet 0   warfarin (COUMADIN ) 3 MG tablet TAKE 1 1/2 TABLETS TO 2 TABLETS BY MOUTH DAILY OR AS DIRECTED BY COAGULATION CLINIC 140 tablet 0   No facility-administered medications prior to visit.   Past Medical History:  Diagnosis Date   Asthma    Headache    migraines   Heart murmur    Hypokalemia 06/08/2020   Leukocytosis 01/23/2022   Mild aortic stenosis    Pancreatitis    Pneumonia    as a child x several   Substance abuse [facility])    Past Surgical History:  Procedure Laterality Date   AORTIC VALVE REPLACEMENT N/A 09/27/2021   Procedure: AORTIC VALVE REPLACEMENT (AVR) USING ON-X AORTIC VALVE SIZE ;  Surgeon: Kerrin Elspeth BROCKS, MD;  Location: [facility] OR;  Service: Open Heart Surgery;  Laterality: N/A;   BIOPSY  08/01/2023   Procedure: BIOPSY;  Surgeon: Wilhelmenia Aloha Raddle., MD;  Location: [facility];  Service: Gastroenterology;;   ESOPHAGOGASTRODUODENOSCOPY N/A 08/01/2023   Procedure: ESOPHAGOGASTRODUODENOSCOPY (EGD);  Surgeon: Wilhelmenia Aloha Raddle., MD;  Location: [facility];  Service: Gastroenterology;  Laterality: N/A;   EUS N/A 08/01/2023   Procedure: UPPER ENDOSCOPIC ULTRASOUND (EUS) RADIAL;  Surgeon: Wilhelmenia Aloha Raddle., MD;  Location: [facility];  Service: Gastroenterology;  Laterality: N/A;   FINE NEEDLE ASPIRATION N/A 08/01/2023   Procedure: FINE NEEDLE ASPIRATION (FNA) LINEAR;  Surgeon: Wilhelmenia Aloha Raddle., MD;  Location: [facility];  Service: Gastroenterology;  Laterality: N/A;   INGUINAL HERNIA REPAIR Right 01/05/2022   Procedure: OPEN REPAIR INCARCERATED RIGHT INGUINAL HERNIA WITH MESH;  Surgeon: Tanda Locus, MD;  Location: [facility];  Service: General;  Laterality: Right;  90 MIN - ROOM 2    INGUINAL HERNIA REPAIR Left 12/20/2022   Procedure: OPEN REPAIR LEFT INGUINAL HERNIA WITH MESH;  Surgeon: Tanda Locus, MD;  Location: [facility];  Service: General;  Laterality: Left;   REPLACEMENT ASCENDING AORTA N/A 09/27/2021   Procedure: REPLACEMENT ASCENDING AND INNOMINATE ANEURYSM USING HEMASHIELD PLATINUM [id number];  Surgeon: Kerrin Elspeth BROCKS, MD;  Location: [facility] OR;  Service: Open Heart Surgery;  Laterality: N/A;   RIGHT HEART CATH AND CORONARY ANGIOGRAPHY N/A 09/07/2021   Procedure: RIGHT HEART CATH AND CORONARY ANGIOGRAPHY;  Surgeon: Wendel Lurena POUR, MD;  Location: [facility];  Service: Cardiovascular;  Laterality: N/A;   TEE WITHOUT CARDIOVERSION N/A 09/27/2021   Procedure: TRANSESOPHAGEAL ECHOCARDIOGRAM (TEE);  Surgeon: Kerrin Elspeth BROCKS, MD;  Location: [facility] OR;  Service: Open Heart Surgery;  Laterality: N/A;   Allergies  Allergen Reactions   Amoxicillin Hives      Objective:    Physical Exam Vitals and nursing note reviewed.  Constitutional:      General: He is not in acute distress.    Appearance: Normal appearance.  HENT:     Head: Normocephalic.   Cardiovascular:     Rate and  Rhythm: Normal rate and regular rhythm.  Pulmonary:     Effort: Pulmonary effort is normal.     Breath sounds: Normal breath sounds.   Musculoskeletal:        General: Normal range of motion.     Cervical back: Normal range of motion.   Skin:    General: Skin is warm and dry.   Neurological:     Mental Status: He is alert and oriented to person, place, and time.   Psychiatric:        Mood and Affect: Mood normal.    BP 137/77   Pulse (!) 103   Temp 98.8 F (37.1 C)   Ht 5' 11 (1.803 m)   Wt 154 lb (69.9 kg)   SpO2 98%   BMI 21.48 kg/m  Wt Readings from Last 3 Encounters:  12/02/23 154 lb (69.9 kg)  09/09/23 170 lb 9.6 oz (77.4 kg)  08/01/23 164 lb 14.5 oz (74.8 kg)      Lucius Krabbe, NP

## 2023-12-06 NOTE — Progress Notes (Signed)
 Anticoag encounter

## 2023-12-07 ENCOUNTER — Encounter (HOSPITAL_COMMUNITY): Payer: Self-pay

## 2023-12-07 ENCOUNTER — Other Ambulatory Visit: Payer: Self-pay

## 2023-12-07 ENCOUNTER — Observation Stay (HOSPITAL_COMMUNITY)
Admission: EM | Admit: 2023-12-07 | Discharge: 2023-12-09 | Disposition: A | Discharge status: Home / Self Care | Attending: Family Medicine | Admitting: Family Medicine

## 2023-12-07 ENCOUNTER — Emergency Department (HOSPITAL_COMMUNITY)

## 2023-12-07 DIAGNOSIS — R634 Abnormal weight loss: Secondary | ICD-10-CM | POA: Insufficient documentation

## 2023-12-07 DIAGNOSIS — Z952 Presence of prosthetic heart valve: Secondary | ICD-10-CM

## 2023-12-07 DIAGNOSIS — Z2981 Encounter for HIV pre-exposure prophylaxis: Secondary | ICD-10-CM | POA: Diagnosis not present

## 2023-12-07 DIAGNOSIS — R109 Unspecified abdominal pain: Secondary | ICD-10-CM | POA: Diagnosis present

## 2023-12-07 DIAGNOSIS — Z7901 Long term (current) use of anticoagulants: Secondary | ICD-10-CM | POA: Insufficient documentation

## 2023-12-07 DIAGNOSIS — R933 Abnormal findings on diagnostic imaging of other parts of digestive tract: Secondary | ICD-10-CM

## 2023-12-07 DIAGNOSIS — G8929 Other chronic pain: Secondary | ICD-10-CM | POA: Diagnosis present

## 2023-12-07 DIAGNOSIS — I10 Essential (primary) hypertension: Secondary | ICD-10-CM | POA: Diagnosis not present

## 2023-12-07 DIAGNOSIS — R1010 Upper abdominal pain, unspecified: Secondary | ICD-10-CM

## 2023-12-07 DIAGNOSIS — J45909 Unspecified asthma, uncomplicated: Secondary | ICD-10-CM | POA: Diagnosis not present

## 2023-12-07 DIAGNOSIS — K8681 Exocrine pancreatic insufficiency: Secondary | ICD-10-CM

## 2023-12-07 DIAGNOSIS — K863 Pseudocyst of pancreas: Secondary | ICD-10-CM

## 2023-12-07 DIAGNOSIS — Z79899 Other long term (current) drug therapy: Secondary | ICD-10-CM

## 2023-12-07 DIAGNOSIS — K5289 Other specified noninfective gastroenteritis and colitis: Secondary | ICD-10-CM | POA: Diagnosis not present

## 2023-12-07 DIAGNOSIS — K2990 Gastroduodenitis, unspecified, without bleeding: Secondary | ICD-10-CM | POA: Diagnosis not present

## 2023-12-07 DIAGNOSIS — K859 Acute pancreatitis without necrosis or infection, unspecified: Secondary | ICD-10-CM | POA: Diagnosis not present

## 2023-12-07 DIAGNOSIS — K2970 Gastritis, unspecified, without bleeding: Secondary | ICD-10-CM | POA: Diagnosis present

## 2023-12-07 DIAGNOSIS — K861 Other chronic pancreatitis: Secondary | ICD-10-CM | POA: Diagnosis not present

## 2023-12-07 DIAGNOSIS — Z7982 Long term (current) use of aspirin: Secondary | ICD-10-CM | POA: Insufficient documentation

## 2023-12-07 DIAGNOSIS — F1011 Alcohol abuse, in remission: Secondary | ICD-10-CM

## 2023-12-07 HISTORY — DX: Abnormal findings on diagnostic imaging of other parts of digestive tract: R93.3

## 2023-12-07 LAB — URINALYSIS, ROUTINE W REFLEX MICROSCOPIC
Bacteria, UA: NONE SEEN
Bilirubin Urine: NEGATIVE
Glucose, UA: NEGATIVE mg/dL
Hgb urine dipstick: NEGATIVE
Ketones, ur: NEGATIVE mg/dL
Leukocytes,Ua: NEGATIVE
Nitrite: NEGATIVE
Protein, ur: NEGATIVE mg/dL
Specific Gravity, Urine: 1.018 (ref 1.005–1.030)
pH: 5 (ref 5.0–8.0)

## 2023-12-07 LAB — COMPREHENSIVE METABOLIC PANEL WITH GFR
ALT: 21 U/L (ref 0–44)
AST: 19 U/L (ref 15–41)
Albumin: 3.9 g/dL (ref 3.5–5.0)
Alkaline Phosphatase: 64 U/L (ref 38–126)
Anion gap: 8 (ref 5–15)
BUN: 8 mg/dL (ref 6–20)
CO2: 28 mmol/L (ref 22–32)
Calcium: 9.1 mg/dL (ref 8.9–10.3)
Chloride: 105 mmol/L (ref 98–111)
Creatinine, Ser: 0.75 mg/dL (ref 0.61–1.24)
GFR, Estimated: >60 mL/min (ref >60)
Glucose, Bld: 130 mg/dL — ABNORMAL HIGH (ref 70–99)
Potassium: 3.7 mmol/L (ref 3.5–5.1)
Sodium: 141 mmol/L (ref 135–145)
Total Bilirubin: 0.6 mg/dL (ref 0.0–1.2)
Total Protein: 7 g/dL (ref 6.5–8.1)

## 2023-12-07 LAB — CBC
HCT: 41 % (ref 39.0–52.0)
Hemoglobin: 13.1 g/dL (ref 13.0–17.0)
MCH: 28.9 pg (ref 26.0–34.0)
MCHC: 32 g/dL (ref 30.0–36.0)
MCV: 90.3 fL (ref 80.0–100.0)
Platelets: 315 10*3/uL (ref 150–400)
RBC: 4.54 MIL/uL (ref 4.22–5.81)
RDW: 13.4 % (ref 11.5–15.5)
WBC: 7.2 10*3/uL (ref 4.0–10.5)
nRBC: 0 % (ref 0.0–0.2)

## 2023-12-07 LAB — LIPID PANEL
Cholesterol: 119 mg/dL (ref 0–200)
HDL: 26 mg/dL — ABNORMAL LOW (ref >40)
LDL Cholesterol: 76 mg/dL (ref 0–99)
Total CHOL/HDL Ratio: 4.6 ratio
Triglycerides: 84 mg/dL (ref <150)
VLDL: 17 mg/dL (ref 0–40)

## 2023-12-07 LAB — HEMOGLOBIN A1C
Hgb A1c MFr Bld: 4.8 % (ref 4.8–5.6)
Mean Plasma Glucose: 91.06 mg/dL

## 2023-12-07 LAB — PROTIME-INR
INR: 1.1 (ref 0.8–1.2)
Prothrombin Time: 14.9 s (ref 11.4–15.2)

## 2023-12-07 LAB — LIPASE, BLOOD: Lipase: 136 U/L — ABNORMAL HIGH (ref 11–51)

## 2023-12-07 LAB — MAGNESIUM: Magnesium: 1.8 mg/dL (ref 1.7–2.4)

## 2023-12-07 LAB — GAMMA GT: GGT: 21 U/L (ref 7–50)

## 2023-12-07 LAB — I-STAT CG4 LACTIC ACID, ED: Lactic Acid, Venous: 1.2 mmol/L (ref 0.5–1.9)

## 2023-12-07 MED ORDER — LACTATED RINGERS IV BOLUS
1000.0000 mL | Freq: Once | INTRAVENOUS | Status: AC
  Administered 2023-12-07: 1000 mL via INTRAVENOUS

## 2023-12-07 MED ORDER — WARFARIN - PHARMACIST DOSING INPATIENT: Freq: Every day | Status: DC

## 2023-12-07 MED ORDER — ACETAMINOPHEN 325 MG PO TABS
650.0000 mg | ORAL_TABLET | Freq: Four times a day (QID) | ORAL | Status: DC | PRN
  Administered 2023-12-07: 650 mg via ORAL
  Filled 2023-12-07 (×2): qty 2

## 2023-12-07 MED ORDER — WARFARIN SODIUM 10 MG PO TABS
10.0000 mg | ORAL_TABLET | Freq: Once | ORAL | Status: AC
  Administered 2023-12-07: 10 mg via ORAL
  Filled 2023-12-07: qty 1

## 2023-12-07 MED ORDER — ACETAMINOPHEN 650 MG RE SUPP: 650.0000 mg | Freq: Four times a day (QID) | RECTAL | Status: DC | PRN

## 2023-12-07 MED ORDER — EMTRICITABINE-TENOFOVIR AF 200-25 MG PO TABS
1.0000 | ORAL_TABLET | Freq: Every day | ORAL | Status: DC
  Administered 2023-12-07 – 2023-12-09 (×3): 1 via ORAL
  Filled 2023-12-07 (×3): qty 1

## 2023-12-07 MED ORDER — HYDROMORPHONE HCL 1 MG/ML IJ SOLN
1.0000 mg | Freq: Once | INTRAMUSCULAR | Status: AC
  Administered 2023-12-07: 1 mg via INTRAVENOUS
  Filled 2023-12-07: qty 1

## 2023-12-07 MED ORDER — METOPROLOL SUCCINATE ER 25 MG PO TB24
12.5000 mg | ORAL_TABLET | Freq: Every day | ORAL | Status: DC
  Administered 2023-12-07 – 2023-12-08 (×2): 12.5 mg via ORAL
  Filled 2023-12-07 (×2): qty 1

## 2023-12-07 MED ORDER — OXYCODONE-ACETAMINOPHEN 5-325 MG PO TABS
1.0000 | ORAL_TABLET | ORAL | Status: AC | PRN
  Administered 2023-12-07 (×2): 1 via ORAL
  Filled 2023-12-07 (×2): qty 1

## 2023-12-07 MED ORDER — ONDANSETRON HCL 4 MG PO TABS: 4.0000 mg | ORAL_TABLET | Freq: Four times a day (QID) | ORAL | Status: DC | PRN

## 2023-12-07 MED ORDER — LACTATED RINGERS IV SOLN: INTRAVENOUS | Status: AC

## 2023-12-07 MED ORDER — HYDROMORPHONE HCL 1 MG/ML IJ SOLN
1.0000 mg | Freq: Once | INTRAMUSCULAR | Status: AC
  Administered 2023-12-07: 1 mg via INTRAVENOUS

## 2023-12-07 MED ORDER — MORPHINE SULFATE (PF) 2 MG/ML IV SOLN
2.0000 mg | INTRAVENOUS | Status: DC | PRN
  Administered 2023-12-07 – 2023-12-08 (×2): 2 mg via INTRAVENOUS
  Filled 2023-12-07 (×2): qty 1

## 2023-12-07 MED ORDER — IOHEXOL 350 MG/ML SOLN
75.0000 mL | Freq: Once | INTRAVENOUS | Status: AC | PRN
  Administered 2023-12-07: 75 mL via INTRAVENOUS

## 2023-12-07 MED ORDER — PANTOPRAZOLE SODIUM 40 MG IV SOLR
40.0000 mg | Freq: Two times a day (BID) | INTRAVENOUS | Status: DC
  Administered 2023-12-07 – 2023-12-09 (×5): 40 mg via INTRAVENOUS
  Filled 2023-12-07 (×5): qty 10

## 2023-12-07 MED ORDER — ONDANSETRON HCL 4 MG/2ML IJ SOLN
4.0000 mg | Freq: Once | INTRAMUSCULAR | Status: AC
  Administered 2023-12-07: 4 mg via INTRAVENOUS
  Filled 2023-12-07: qty 2

## 2023-12-07 MED ORDER — ASPIRIN 81 MG PO TBEC
81.0000 mg | DELAYED_RELEASE_TABLET | Freq: Every day | ORAL | Status: DC
  Administered 2023-12-07 – 2023-12-09 (×3): 81 mg via ORAL
  Filled 2023-12-07 (×3): qty 1

## 2023-12-07 MED ORDER — ONDANSETRON 4 MG PO TBDP
4.0000 mg | ORAL_TABLET | Freq: Once | ORAL | Status: AC | PRN
  Administered 2023-12-07: 4 mg via ORAL
  Filled 2023-12-07: qty 1

## 2023-12-07 MED ORDER — ONDANSETRON HCL 4 MG/2ML IJ SOLN
4.0000 mg | Freq: Four times a day (QID) | INTRAMUSCULAR | Status: DC | PRN
  Administered 2023-12-07 – 2023-12-09 (×4): 4 mg via INTRAVENOUS
  Filled 2023-12-07 (×4): qty 2

## 2023-12-07 NOTE — ED Triage Notes (Signed)
 Pt c/o abdominal pain that radiates around back that started yesterday, has had diarrhea and nausea. Pt states this is similar to previous pancreatitis flare-ups. Pt states he has tried hot baths and methocarbamol  w/o relief.

## 2023-12-07 NOTE — Progress Notes (Signed)
 Admitted to 6[facility] 28, alert and oriented, no signs of distress, IV infusing . Vitals stable. Will endorse appropriately.

## 2023-12-07 NOTE — ED Provider Notes (Signed)
  Provider Note MRN:  [id number]  Arrival date & time: 12/07/23    ED Course and Medical Decision Making  Assumed care from Dr Carita at shift change.  See note from prior team for complete details, in brief:  Clinical Course as of 12/07/23 1242  Sat Dec 07, 2023  0707 Handoff SR Hx pancreatitis w/ pseudocyst Typical pain today Lipase + CT pending  [SG]  1052 CT w/ acute on chronic pancreatitis  [SG]    Clinical Course User Index [SG] Elnor Savant A, DO   Ongoing pain, multiple analgesics, nausea  Spoke w/ PA Vina w/ [facility], will see in consult  Admit acute on chronic pancreatitis  Procedures  Final Clinical Impressions(s) / ED Diagnoses     ICD-10-CM   1. Acute on chronic pancreatitis [facility])  K85.90    K86.1       ED Discharge Orders     None       Discharge Instructions   None        Elnor Savant LABOR, DO 12/07/23 1242

## 2023-12-07 NOTE — Consult Note (Signed)
 Consultation Note   Referring Provider:  [facility] PCP: Lucius Krabbe, NP Primary Gastroenterologist:   Gordy Starch, MD     Reason for Consultation:  Acute on chronic pancreatitis DOA: 12/07/2023         Hospital Day: 1   ASSESSMENT    33 year old male with a history of AVR on warfarin,  chronic Etoh pancreatitis complicated by pseudocysts  Acute on chronic ETOH pancreatitis complicated by pseudocysts . CT scan findings of antral wall thickening / new complex fluid collection along the inferior wall of the gastric antrum measuring 5.0 x 3.7 cm. Additionally, there is mild wall thickening with surrounding soft tissue stranding involving the transverse colon but this may be reactive.   History of Etoh abuse.  In remission, no Etoh in 8 months  AVR, on warfarin  INR 1.1  See PMH for additional history  Principal Problem:   Abdominal pain Active Problems:   S/P AVR (aortic valve replacement) and aortoplasty   Chronic pancreatitis (HCC)   Essential hypertension   Pseudocyst of pancreas   Gastritis and gastroduodenitis   On pre-exposure prophylaxis for HIV     PLAN:   --CLD --Supportive care --Dr. Leigh will review CT scan findings  HPI   [name] [name] has chronic Etoh related pancreatitis complicated by pseudocysts. From a pancreas standpoint he had been doing relatively well since April. Now, over the last few weeks he has had intermittent mild flare ups of abdominal pain and nausea. Symptoms usually settle down with clear liquids.  Last night he had severe generalized upper abdominal pain radiating through to his back. The back pain was actually worse than the abdominal pain.   He was also having loose stools yesterday which he says generally occur with pancreatitis flare ups. He takes Creon  with meals and most snacks until eats non-fatty snack like fruit. He has lost about 40 pounds over the last year. He has a good  appetite but can be scared to eat for fear of abdominal pain. No early satiety  [name] [name] hasn't had any Etoh in 8 months. He is on Truvada or HIV prophylaxis  He is afebrile, hemodynamically stable. Lipase is 136. CBC normal. LFTs normal.   CTAP with contrast - signs of acute on chronic pancreatitis, new abnormal low-attenuation wall thickening involving the gastric antrum. Along the posterior and inferior wall of the gastric antrum there is a new complex fluid collection which measures 5.0 x 3.7 cm, mild wall thickening with surrounding soft tissue stranding is noted involving the transverse colon which is favored to represent secondary inflammation. Small volume of free fluid is identified extending over the right lobe of liver, and in the dependent portion of the pelvis.   Labs and Imaging:  Recent Labs    12/07/23 0318  PROT 7.0  ALBUMIN  3.9  AST 19  ALT 21  ALKPHOS 64  BILITOT 0.6   Recent Labs    12/07/23 0318  WBC 7.2  HGB 13.1  HCT 41.0  MCV 90.3  PLT 315   Recent Labs    12/07/23 0318  NA 141  K 3.7  CL 105  CO2 28  GLUCOSE 130*  BUN 8  CREATININE 0.75  CALCIUM  9.1  CT ABDOMEN PELVIS W CONTRAST CLINICAL DATA:  Evaluate pancreatitis.  EXAM: CT ABDOMEN AND PELVIS WITH CONTRAST  TECHNIQUE: Multidetector CT imaging of the abdomen and pelvis was performed using the standard protocol following bolus administration of intravenous contrast.  RADIATION DOSE REDUCTION: This exam was performed according to the departmental dose-optimization program which includes automated exposure control, adjustment of the mA and/or kV according to patient size and/or use of iterative reconstruction technique.  CONTRAST:  75mL OMNIPAQUE  IOHEXOL  350 MG/ML SOLN  COMPARISON:  05/08/2023  FINDINGS: Lower chest: No pleural fluid or consolidation. Dependent changes noted within the posterior lung bases.  Hepatobiliary: No focal liver abnormality is seen. No  gallstones, gallbladder wall thickening, or biliary dilatation.  Pancreas: Signs of acute on chronic pancreatitis identified. There is main duct dilatation measuring up to 8.6 mm at the pancreatic neck. Coarse calcifications within the head of pancreas. There is diffuse edema and soft tissue stranding about the head of pancreas extending into the lesser sac and porta hepatic regions. Previous pseudo cyst within the head of pancreas measures 2.1 by 3.3 cm and 16 Hounsfield units, image 45/3. Previously this measured 3.7 x 2.6 cm with 68 Hounsfield units. There is new abnormal low-attenuation wall thickening involving the gastric antrum. Along the posterior and inferior wall of the gastric antrum there is a new complex fluid collection which measures 5.0 x 3.7 cm and 51 Hounsfield units, image 45/3.  Spleen: Normal in size without focal abnormality.  Adrenals/Urinary Tract: Normal adrenal glands. No kidney mass or obstructive uropathy. Urinary bladder appears normal.  Stomach/Bowel: No pathologic dilatation of the large or small bowel loops to suggest obstruction. The appendix is visualized and appears normal. Mild wall thickening with surrounding soft tissue stranding is noted involving the transverse colon which is favored to represent secondary inflammation.  Vascular/Lymphatic: Normal appearance of the abdominal aorta. Patent upper abdominal vascularity. No signs of abdominopelvic adenopathy.  Reproductive: Prostate is unremarkable.  Other: Small volume of free fluid is identified extending over the right lobe of liver, and in the dependent portion of the pelvis. No signs of pneumoperitoneum.  Musculoskeletal: No acute or significant osseous findings.  IMPRESSION: 1. Signs of acute on chronic pancreatitis. There is diffuse edema and soft tissue stranding about the head of pancreas extending into the lesser sac and porta hepatic regions. There is new  abnormal low-attenuation wall thickening involving the gastric antrum. Along the posterior and inferior wall of the gastric antrum there is a new complex fluid collection which measures 5.0 x 3.7 cm and 51 Hounsfield units. Findings are concerning for acute on chronic pancreatitis with pseudocyst formation recommend follow-up imaging following resolution of this acute episode to ensure resolution. 2. Mild wall thickening with surrounding soft tissue stranding is noted involving the transverse colon which is favored to represent secondary inflammation. 3. Small volume of free fluid is identified extending over the right lobe of liver, and in the dependent portion of the pelvis.  Electronically Signed   By: Waddell Calk M.D.   On: 12/07/2023 09:11   Pertinent GI Studies   February 2025 EGD/EUS EGD impression: - No gross lesions in the entire esophagus. Z-line irregular, 40 cm from the incisors. - 2 cm hiatal hernia. - Gastric diverticulum in the fundus. - Erythematous mucosa in the stomach. Biopsied. - No gross lesions in the duodenal bulb, in the first portion of the duodenum and in the second portion of the duodenum. - Non-bleeding duodenal diverticulum in the area of the papilla. -  Normal major papilla with long intraduodenal portion hidden under a hood.   EUS impression: - A cystic lesion was seen in the pancreatic head/genu of the pancreas. Cytology results are pending. However, the endosonographic appearance is suggestive of a pancreatic pseudocyst. Fine needle aspiration for fluid performed, however as result of the patient's recurrent episodes of pancreatitis to ensure that this is not a mucinous cyst. - Endosonographic imaging of the pancreas showed sonographic changes consistent with moderate-severe chronic pancreatitis. Pancreatic duct dilation is noted. There appears to be a 5 mm pancreatic ductal stone in the area where PD dilation occurs within the head/neck region transition. -  There was no sign of significant pathology in the common bile duct. - Hyperechoic material consistent with small amount of sludge was visualized endosonographically in the gallbladder. - One enlarged lymph node was visualized in the porta hepatis region. Tissue has not been obtained. However, the endosonographic appearance is consistent with benign inflammatory changes. Cytology - no malignant cells    Past Medical History:  Diagnosis Date   Asthma    Headache    migraines   Heart murmur    Hypokalemia 06/08/2020   Leukocytosis 01/23/2022   Mild aortic stenosis    Pancreatitis    Pneumonia    as a child x several   Substance abuse [facility])     Past Surgical History:  Procedure Laterality Date   AORTIC VALVE REPLACEMENT N/A 09/27/2021   Procedure: AORTIC VALVE REPLACEMENT (AVR) USING ON-X AORTIC VALVE SIZE ;  Surgeon: Kerrin Elspeth BROCKS, MD;  Location: [facility] OR;  Service: Open Heart Surgery;  Laterality: N/A;   BIOPSY  08/01/2023   Procedure: BIOPSY;  Surgeon: Wilhelmenia Aloha Raddle., MD;  Location: [facility];  Service: Gastroenterology;;   ESOPHAGOGASTRODUODENOSCOPY N/A 08/01/2023   Procedure: ESOPHAGOGASTRODUODENOSCOPY (EGD);  Surgeon: Wilhelmenia Aloha Raddle., MD;  Location: [facility];  Service: Gastroenterology;  Laterality: N/A;   EUS N/A 08/01/2023   Procedure: UPPER ENDOSCOPIC ULTRASOUND (EUS) RADIAL;  Surgeon: Wilhelmenia Aloha Raddle., MD;  Location: [facility];  Service: Gastroenterology;  Laterality: N/A;   FINE NEEDLE ASPIRATION N/A 08/01/2023   Procedure: FINE NEEDLE ASPIRATION (FNA) LINEAR;  Surgeon: Wilhelmenia Aloha Raddle., MD;  Location: [facility];  Service: Gastroenterology;  Laterality: N/A;   INGUINAL HERNIA REPAIR Right 01/05/2022   Procedure: OPEN REPAIR INCARCERATED RIGHT INGUINAL HERNIA WITH MESH;  Surgeon: Tanda Locus, MD;  Location: [facility] ORS;  Service: General;  Laterality: Right;  90 MIN - ROOM 2   INGUINAL HERNIA REPAIR Left 12/20/2022   Procedure: OPEN  REPAIR LEFT INGUINAL HERNIA WITH MESH;  Surgeon: Tanda Locus, MD;  Location: [facility];  Service: General;  Laterality: Left;   REPLACEMENT ASCENDING AORTA N/A 09/27/2021   Procedure: REPLACEMENT ASCENDING AND INNOMINATE ANEURYSM USING HEMASHIELD PLATINUM [id number];  Surgeon: Kerrin Elspeth BROCKS, MD;  Location: [facility] OR;  Service: Open Heart Surgery;  Laterality: N/A;   RIGHT HEART CATH AND CORONARY ANGIOGRAPHY N/A 09/07/2021   Procedure: RIGHT HEART CATH AND CORONARY ANGIOGRAPHY;  Surgeon: Wendel Lurena POUR, MD;  Location: [facility];  Service: Cardiovascular;  Laterality: N/A;   TEE WITHOUT CARDIOVERSION N/A 09/27/2021   Procedure: TRANSESOPHAGEAL ECHOCARDIOGRAM (TEE);  Surgeon: Kerrin Elspeth BROCKS, MD;  Location: [facility] OR;  Service: Open Heart Surgery;  Laterality: N/A;    Family History  Problem Relation Age of Onset   Pancreatitis Maternal Uncle     Prior to Admission medications   Medication Sig Start Date End Date Taking? Authorizing Provider  acetaminophen  (TYLENOL )  500 MG tablet Take 1,000 mg by mouth every 6 (six) hours as needed for moderate pain (pain score 4-6).   Yes [provider]  aspirin  EC 81 MG EC tablet Take 1 tablet (81 mg total) by mouth daily. Swallow whole. 10/02/21  Yes Barrett, Erin R, [name]  dicyclomine  (BENTYL ) 10 MG capsule TAKE 2 CAPSULES(20 MG) BY MOUTH FOUR TIMES DAILY BEFORE MEALS AND AT BEDTIME Patient taking differently: Take 20 mg by mouth 3 (three) times daily. 10/25/23  Yes McMichael, Bayley M, [name]  emtricitabine-tenofovir (TRUVADA) 200-300 MG tablet Take 1 tablet by mouth daily. 11/06/23  Yes [provider]  lipase/protease/amylase (CREON ) 36000 UNITS CPEP capsule Take 1 capsule (36,000 Units total) by mouth 3 (three) times daily before meals. 11/26/23  Yes McMichael, Bayley M, [name]  loratadine  (CLARITIN ) 10 MG tablet Take 10 mg by mouth daily.   Yes [provider]  methocarbamol  (ROBAXIN ) 500 MG tablet Take 1-2 tablets  (500-1,000 mg total) by mouth 4 (four) times daily. 11/19/23  Yes Lucius Krabbe, NP  metoprolol  succinate (TOPROL  XL) 25 MG 24 hr tablet Take 0.5 tablets (12.5 mg total) by mouth at bedtime. 07/10/23  Yes Thukkani, Arun K, MD  ondansetron  (ZOFRAN ) 4 MG tablet Take 1 tablet (4 mg total) by mouth every 8 (eight) hours as needed for nausea or vomiting. 04/13/23  Yes Cheryle Page, MD  warfarin (COUMADIN ) 3 MG tablet TAKE 1 1/2 TABLETS TO 2 TABLETS BY MOUTH DAILY OR AS DIRECTED BY COAGULATION CLINIC Patient taking differently: Take 4.5-6 mg by mouth See admin instructions. Take 6mg  (2 tablets) by mouth every Monday and Wednesday, and take 4.5mg  (1 and 1/2 tablet) every other day of the week. 08/21/23  Yes Thukkani, Arun K, MD    Current Facility-Administered Medications  Medication Dose Route Frequency Provider Last Rate Last Admin   acetaminophen  (TYLENOL ) tablet 650 mg  650 mg Oral Q6H PRN Patel, Ekta V, MD       Or   acetaminophen  (TYLENOL ) suppository 650 mg  650 mg Rectal Q6H PRN Patel, Ekta V, MD       aspirin  EC tablet 81 mg  81 mg Oral Daily Patel, Ekta V, MD       emtricitabine-tenofovir AF (DESCOVY) 200-25 MG per tablet 1 tablet  1 tablet Oral Daily Tobie Mario GAILS, MD       lactated ringers  infusion   Intravenous Continuous Tobie Mario GAILS, MD       metoprolol  succinate (TOPROL -XL) 24 hr tablet 12.5 mg  12.5 mg Oral QHS Patel, Ekta V, MD       morphine  (PF) 2 MG/ML injection 2 mg  2 mg Intravenous Q4H PRN Patel, Ekta V, MD       ondansetron  (ZOFRAN ) tablet 4 mg  4 mg Oral Q6H PRN Patel, Ekta V, MD       Or   ondansetron  (ZOFRAN ) injection 4 mg  4 mg Intravenous Q6H PRN Patel, Ekta V, MD       pantoprazole  (PROTONIX ) injection 40 mg  40 mg Intravenous Q12H Patel, Ekta V, MD   40 mg at 12/07/23 1243   warfarin (COUMADIN ) tablet 10 mg  10 mg Oral ONCE-1600 Patel, Ekta V, MD       Warfarin - Pharmacist Dosing Inpatient   Does not apply [id number] Tobie Mario GAILS, MD       Current Outpatient  Medications  Medication Sig Dispense Refill   acetaminophen  (TYLENOL ) 500 MG tablet Take 1,000 mg by mouth every  6 (six) hours as needed for moderate pain (pain score 4-6).     aspirin  EC 81 MG EC tablet Take 1 tablet (81 mg total) by mouth daily. Swallow whole. 30 tablet 11   dicyclomine  (BENTYL ) 10 MG capsule TAKE 2 CAPSULES(20 MG) BY MOUTH FOUR TIMES DAILY BEFORE MEALS AND AT BEDTIME (Patient taking differently: Take 20 mg by mouth 3 (three) times daily.) 360 capsule 1   emtricitabine-tenofovir (TRUVADA) 200-300 MG tablet Take 1 tablet by mouth daily.     lipase/protease/amylase (CREON ) 36000 UNITS CPEP capsule Take 1 capsule (36,000 Units total) by mouth 3 (three) times daily before meals. 200 capsule 2   loratadine  (CLARITIN ) 10 MG tablet Take 10 mg by mouth daily.     methocarbamol  (ROBAXIN ) 500 MG tablet Take 1-2 tablets (500-1,000 mg total) by mouth 4 (four) times daily. 30 tablet 0   metoprolol  succinate (TOPROL  XL) 25 MG 24 hr tablet Take 0.5 tablets (12.5 mg total) by mouth at bedtime. 45 tablet 2   ondansetron  (ZOFRAN ) 4 MG tablet Take 1 tablet (4 mg total) by mouth every 8 (eight) hours as needed for nausea or vomiting. 20 tablet 0   warfarin (COUMADIN ) 3 MG tablet TAKE 1 1/2 TABLETS TO 2 TABLETS BY MOUTH DAILY OR AS DIRECTED BY COAGULATION CLINIC (Patient taking differently: Take 4.5-6 mg by mouth See admin instructions. Take 6mg  (2 tablets) by mouth every Monday and Wednesday, and take 4.5mg  (1 and 1/2 tablet) every other day of the week.) 140 tablet 0    Allergies as of 12/07/2023 - Review Complete 12/07/2023  Allergen Reaction Noted   Amoxicillin Hives 11/01/2016    Social History   Socioeconomic History   Marital status: Single    Spouse name: Not on file   Number of children: Not on file   Years of education: Not on file   Highest education level: Bachelor's degree (e.g., BA, AB, BS)  Occupational History   Occupation: Bartender    Comment: [employer]   Tobacco Use   Smoking status: Never   Smokeless tobacco: Never  Vaping Use   Vaping status: Never Used  Substance and Sexual Activity   Alcohol use: Yes    Comment: none x 1 month as od 04/11/2023   Drug use: Not Currently    Types: Other-see comments    Comment: Edibles occasionally marijuana   Sexual activity: Yes  Other Topics Concern   Not on file  Social History Narrative   Not on file   Social Drivers of Health   Financial Resource Strain: Low Risk  (04/25/2023)   Overall Financial Resource Strain (CARDIA)    Difficulty of Paying Living Expenses: Not very hard  Food Insecurity: No Food Insecurity (04/25/2023)   Hunger Vital Sign    Worried About Running Out of Food in the Last Year: Never true    Ran Out of Food in the Last Year: Never true  Transportation Needs: No Transportation Needs (04/25/2023)   PRAPARE - Administrator, Civil Service (Medical): No    Lack of Transportation (Non-Medical): No  Physical Activity: Sufficiently Active (04/25/2023)   Exercise Vital Sign    Days of Exercise per Week: 5 days    Minutes of Exercise per Session: 150+ min  Stress: No Stress Concern Present (04/25/2023)   [employer] of Occupational Health - Occupational Stress Questionnaire    Feeling of Stress : Only a little  Social Connections: Unknown (04/25/2023)   Social Connection and Isolation  Panel    Frequency of Communication with Friends and Family: Three times a week    Frequency of Social Gatherings with Friends and Family: Once a week    Attends Religious Services: Patient declined    Database administrator or Organizations: No    Attends Engineer, structural: Not on file    Marital Status: Never married  Intimate Partner Violence: Not At Risk (04/12/2023)   Humiliation, Afraid, Rape, and Kick questionnaire    Fear of Current or Ex-Partner: No    Emotionally Abused: No    Physically Abused: No    Sexually Abused: No     Code Status    Code Status: Full Code  Review of Systems: All systems reviewed and negative except where noted in HPI.  Physical Exam: Vital signs in last 24 hours: Temp:  [97.7 F (36.5 C)-97.8 F (36.6 C)] 97.8 F (36.6 C) (06/28 1047) Pulse Rate:  [66-71] 68 (06/28 1015) Resp:  [16-17] 17 (06/28 1015) BP: (139-159)/(84-93) 147/84 (06/28 1015) SpO2:  [96 %-100 %] 100 % (06/28 1015) Weight:  [70.3 kg] 70.3 kg (06/28 0310)    General:  Pleasant thin male in NAD Psych:  Cooperative. Normal mood and affect Eyes: Pupils equal Ears:  Normal auditory acuity Nose: No deformity, discharge or lesions Neck:  Supple, no masses felt Lungs:  Clear to auscultation.  Heart:  Regular rate, regular rhythm.  Abdomen:  Soft, nondistended, nontender, active bowel sounds, no masses felt Rectal :  Deferred Msk: Symmetrical without gross deformities.  Neurologic:  Alert, oriented, grossly normal neurologically Extremities : No edema Skin:  Intact without significant lesions.    Intake/Output from previous day: No intake/output data recorded. Intake/Output this shift:  No intake/output data recorded.   Vina Dasen, NP-C   12/07/2023, 1:54 PM

## 2023-12-07 NOTE — Progress Notes (Signed)
 PHARMACY - ANTICOAGULATION CONSULT NOTE  Pharmacy Consult for warfarin  Indication: AVR  Allergies  Allergen Reactions   Amoxicillin Hives    Patient Measurements: Height: 5' 11 (180.3 cm) Weight: 70.3 kg (155 lb) IBW/kg (Calculated) : 75.3 HEPARIN  DW (KG): 70.3  Vital Signs: Temp: 97.8 F (36.6 C) (06/28 1047) Temp Source: Oral (06/28 1047) BP: 147/84 (06/28 1015) Pulse Rate: 68 (06/28 1015)  Labs: Recent Labs    12/07/23 0318 12/07/23 0603  HGB 13.1  --   HCT 41.0  --   PLT 315  --   LABPROT  --  14.9  INR  --  1.1  CREATININE 0.75  --     Estimated Creatinine Clearance: 131.8 mL/min (by C-G formula based on SCr of 0.75 mg/dL).   Medical History: Past Medical History:  Diagnosis Date   Asthma    Headache    migraines   Heart murmur    Hypokalemia 06/08/2020   Leukocytosis 01/23/2022   Mild aortic stenosis    Pancreatitis    Pneumonia    as a child x several   Substance abuse (HCC)     Assessment: [name] [name] is a 33 y.o. year old male presented on 12/07/2023 with concern for abdominal pain found to have acute on chronic pancreatitis. On warfarin prior to admission for AVR. Prior to admission regimen, 6mg  MW and 4.5mg  all other days. Last dose prior to admission 6/24 - noted to have run out of prescription. Pharmacy consulted for warfarin inpatient management.   Date INR Warfarin Dose  6/28 1.1, subtherapeutic  ---   Drug-Drug interactions: No new interactions noted  Goal of Therapy:  INR goal 1.5-2 Monitor platelets by anticoagulation protocol: Yes   Plan:  Warfarin 10mg  x 1 today F/u daily INR, CBC and monitoring for bleeding   Thank you for allowing pharmacy to participate in this patient's care.  Leonor GORMAN Bash, PharmD Emergency Medicine Clinical Pharmacist 12/07/2023,1:54 PM

## 2023-12-07 NOTE — H&P (Signed)
 History and Physical    Patient: [name] [name] FMW:[id number] DOB: 08/22/1990 DOA: 12/07/2023 DOS: the patient was seen and examined on 12/07/2023 . PCP: Lucius Krabbe, NP  Patient coming from: Home Chief complaint: Chief Complaint  Patient presents with   Abdominal Pain   HPI:  [name] [name] is a 33 y.o. male with past medical history  of  asthma, GERD , hypertension, bicuspid aortic valve with severe aortic insufficiency and moderate aortic stenosis status post mechanical aortic valve replacement and aortoplasty on 09/27/2021 with goal INR between 1.5-2 on Coumadin  and aspirin  indefinitely, asthma, recurrent multiple episodes of acute pancreatitis since 2021  2/2 to alcohol abuse which he has stopped since 8 months comes for abd pain abdominal pain radiating to his back.   and intractable nausea. No vomiting , fever chills or any other complaints.  ED Course: Pt in ed at bedside  is no distress is alert awake oriented gives history. Vital signs in the ED were notable for the following:  Vitals:   12/07/23 0713 12/07/23 0715 12/07/23 1015 12/07/23 1047  BP: (!) 140/93 139/86 (!) 147/84   Pulse: 66 67 68   Temp: 97.7 F (36.5 C)   97.8 F (36.6 C)  Resp: 17  17   Height:      Weight:      SpO2: 96% 97% 100%   TempSrc:    Oral  BMI (Calculated):      >>ED evaluation thus far shows:         >>While in the ED patient received the following: Medications  pantoprazole  (PROTONIX ) injection 40 mg (40 mg Intravenous Given 12/07/23 1243)  ondansetron  (ZOFRAN -ODT) disintegrating tablet 4 mg (4 mg Oral Given 12/07/23 0317)  oxyCODONE -acetaminophen  (PERCOCET/ROXICET) 5-325 MG per tablet 1 tablet (1 tablet Oral Given 12/07/23 1026)  lactated ringers  bolus 1,000 mL (0 mLs Intravenous Stopped 12/07/23 1136)  ondansetron  (ZOFRAN ) injection 4 mg (4 mg Intravenous Given 12/07/23 0654)  HYDROmorphone  (DILAUDID ) injection 1 mg (1 mg Intravenous Given 12/07/23  0654)  lactated ringers  bolus 1,000 mL (0 mLs Intravenous Stopped 12/07/23 1136)  HYDROmorphone  (DILAUDID ) injection 1 mg (1 mg Intravenous Given 12/07/23 0727)  iohexol  (OMNIPAQUE ) 350 MG/ML injection 75 mL (75 mLs Intravenous Contrast Given 12/07/23 0811)     Review of Systems  Gastrointestinal:  Positive for abdominal pain and nausea.  All other systems reviewed and are negative.  Past Medical History:  Diagnosis Date   Asthma    Headache    migraines   Heart murmur    Hypokalemia 06/08/2020   Leukocytosis 01/23/2022   Mild aortic stenosis    Pancreatitis    Pneumonia    as a child x several   Substance abuse [facility])    Past Surgical History:  Procedure Laterality Date   AORTIC VALVE REPLACEMENT N/A 09/27/2021   Procedure: AORTIC VALVE REPLACEMENT (AVR) USING ON-X AORTIC VALVE SIZE ;  Surgeon: Kerrin Elspeth BROCKS, MD;  Location: [facility] OR;  Service: Open Heart Surgery;  Laterality: N/A;   BIOPSY  08/01/2023   Procedure: BIOPSY;  Surgeon: Wilhelmenia Aloha Raddle., MD;  Location: [facility]COPY;  Service: Gastroenterology;;   ESOPHAGOGASTRODUODENOSCOPY N/A 08/01/2023   Procedure: ESOPHAGOGASTRODUODENOSCOPY (EGD);  Surgeon: Wilhelmenia Aloha Raddle., MD;  Location: [facility];  Service: Gastroenterology;  Laterality: N/A;   EUS N/A 08/01/2023   Procedure: UPPER ENDOSCOPIC ULTRASOUND (EUS) RADIAL;  Surgeon: Wilhelmenia Aloha Raddle., MD;  Location: [facility];  Service: Gastroenterology;  Laterality: N/A;   FINE NEEDLE ASPIRATION  N/A 08/01/2023   Procedure: FINE NEEDLE ASPIRATION (FNA) LINEAR;  Surgeon: Wilhelmenia Aloha Raddle., MD;  Location: [facility];  Service: Gastroenterology;  Laterality: N/A;   INGUINAL HERNIA REPAIR Right 01/05/2022   Procedure: OPEN REPAIR INCARCERATED RIGHT INGUINAL HERNIA WITH MESH;  Surgeon: Tanda Locus, MD;  Location: [facility] ORS;  Service: General;  Laterality: Right;  90 MIN - ROOM 2   INGUINAL HERNIA REPAIR Left 12/20/2022   Procedure: OPEN REPAIR LEFT  INGUINAL HERNIA WITH MESH;  Surgeon: Tanda Locus, MD;  Location: [facility] ORS;  Service: General;  Laterality: Left;   REPLACEMENT ASCENDING AORTA N/A 09/27/2021   Procedure: REPLACEMENT ASCENDING AND INNOMINATE ANEURYSM USING HEMASHIELD PLATINUM [id number];  Surgeon: Kerrin Elspeth BROCKS, MD;  Location: [facility] OR;  Service: Open Heart Surgery;  Laterality: N/A;   RIGHT HEART CATH AND CORONARY ANGIOGRAPHY N/A 09/07/2021   Procedure: RIGHT HEART CATH AND CORONARY ANGIOGRAPHY;  Surgeon: Wendel Lurena POUR, MD;  Location: [facility] LAB;  Service: Cardiovascular;  Laterality: N/A;   TEE WITHOUT CARDIOVERSION N/A 09/27/2021   Procedure: TRANSESOPHAGEAL ECHOCARDIOGRAM (TEE);  Surgeon: Kerrin Elspeth BROCKS, MD;  Location: [facility] OR;  Service: Open Heart Surgery;  Laterality: N/A;    reports that he has never smoked. He has never used smokeless tobacco. He reports current alcohol use. He reports that he does not currently use drugs after having used the following drugs: Other-see comments. Allergies  Allergen Reactions   Amoxicillin Hives   Family History  Problem Relation Age of Onset   Pancreatitis Maternal Uncle    Prior to Admission medications   Medication Sig Start Date End Date Taking? Authorizing Provider  aspirin  EC 81 MG EC tablet Take 1 tablet (81 mg total) by mouth daily. Swallow whole. 10/02/21   Barrett, Erin R, [name]  dicyclomine  (BENTYL ) 10 MG capsule TAKE 2 CAPSULES(20 MG) BY MOUTH FOUR TIMES DAILY BEFORE MEALS AND AT BEDTIME 10/25/23   McMichael, Bayley M, [name]  emtricitabine-tenofovir (TRUVADA) 200-300 MG tablet Take 1 tablet by mouth daily. 11/06/23   [provider]  lipase/protease/amylase (CREON ) 36000 UNITS CPEP capsule Take 1 capsule (36,000 Units total) by mouth 3 (three) times daily before meals. 11/26/23   McMichael, Nestor HERO, [name]  loratadine  (CLARITIN ) 10 MG tablet Take 10 mg by mouth daily.    [provider]  methocarbamol  (ROBAXIN ) 500 MG tablet Take 1-2  tablets (500-1,000 mg total) by mouth 4 (four) times daily. 11/19/23   Lucius Krabbe, NP  metoprolol  succinate (TOPROL  XL) 25 MG 24 hr tablet Take 0.5 tablets (12.5 mg total) by mouth at bedtime. 07/10/23   Thukkani, Arun K, MD  ondansetron  (ZOFRAN ) 4 MG tablet Take 1 tablet (4 mg total) by mouth every 8 (eight) hours as needed for nausea or vomiting. 04/13/23   Cheryle Page, MD  warfarin (COUMADIN ) 3 MG tablet TAKE 1 1/2 TABLETS TO 2 TABLETS BY MOUTH DAILY OR AS DIRECTED BY COAGULATION CLINIC 08/21/23   Thukkani, Arun K, MD                                                                                 Vitals:   12/07/23 9286 12/07/23 0715 12/07/23 1015 12/07/23 1047  BP: (!) 140/93 139/86 (!) 147/84   Pulse: 66 67 68   Resp: 17  17   Temp: 97.7 F (36.5 C)   97.8 F (36.6 C)  TempSrc:    Oral  SpO2: 96% 97% 100%   Weight:      Height:       Physical Exam Vitals and nursing note reviewed.  Constitutional:      General: He is not in acute distress. HENT:     Head: Normocephalic and atraumatic.     Right Ear: Hearing normal.     Left Ear: Hearing normal.     Nose: No nasal deformity.     Mouth/Throat:     Lips: Pink.   Eyes:     General: Lids are normal.     Extraocular Movements: Extraocular movements intact.    Cardiovascular:     Rate and Rhythm: Normal rate and regular rhythm.     Heart sounds: Normal heart sounds.  Pulmonary:     Effort: Pulmonary effort is normal.     Breath sounds: Normal breath sounds.  Abdominal:     General: Bowel sounds are normal. There is no distension.     Palpations: Abdomen is soft. There is no mass.     Tenderness: There is no abdominal tenderness.   Musculoskeletal:     Right lower leg: No edema.     Left lower leg: No edema.   Skin:    General: Skin is warm.   Neurological:     General: No focal deficit present.     Mental Status: He is alert and oriented to person, place, and time.     Cranial Nerves: Cranial nerves 2-12  are intact.   Psychiatric:        Speech: Speech normal.     Labs on Admission: I have personally reviewed following labs and imaging studies CBC: Recent Labs  Lab 12/07/23 0318  WBC 7.2  HGB 13.1  HCT 41.0  MCV 90.3  PLT 315   Basic Metabolic Panel: Recent Labs  Lab 12/07/23 0318  NA 141  K 3.7  CL 105  CO2 28  GLUCOSE 130*  BUN 8  CREATININE 0.75  CALCIUM  9.1   GFR: Estimated Creatinine Clearance: 131.8 mL/min (by C-G formula based on SCr of 0.75 mg/dL). Liver Function Tests: Recent Labs  Lab 12/07/23 0318  AST 19  ALT 21  ALKPHOS 64  BILITOT 0.6  PROT 7.0  ALBUMIN  3.9   Recent Labs  Lab 12/07/23 0318  LIPASE 136*   No results for input(s): AMMONIA in the last 168 hours. Coagulation Profile: Recent Labs  Lab 12/07/23 0603  INR 1.1   Cardiac Enzymes: No results for input(s): CKTOTAL, CKMB, CKMBINDEX, TROPONINI in the last 168 hours. BNP (last 3 results) No results for input(s): PROBNP in the last 8760 hours. HbA1C: No results for input(s): HGBA1C in the last 72 hours. CBG: No results for input(s): GLUCAP in the last 168 hours. Lipid Profile: No results for input(s): CHOL, HDL, LDLCALC, TRIG, CHOLHDL, LDLDIRECT in the last 72 hours. Thyroid Function Tests: No results for input(s): TSH, T4TOTAL, FREET4, T3FREE, THYROIDAB in the last 72 hours. Anemia Panel: No results for input(s): VITAMINB12, FOLATE, FERRITIN, TIBC, IRON, RETICCTPCT in the last 72 hours. Urine analysis:    Component Value Date/Time   COLORURINE YELLOW 12/07/2023 0313   APPEARANCEUR CLEAR 12/07/2023 0313   LABSPEC 1.018 12/07/2023 0313   PHURINE 5.0 12/07/2023 0313   GLUCOSEU NEGATIVE 12/07/2023  0313   HGBUR NEGATIVE 12/07/2023 0313   BILIRUBINUR NEGATIVE 12/07/2023 0313   KETONESUR NEGATIVE 12/07/2023 0313   PROTEINUR NEGATIVE 12/07/2023 0313   NITRITE NEGATIVE 12/07/2023 0313   LEUKOCYTESUR NEGATIVE 12/07/2023 0313    Radiological Exams on Admission: CT ABDOMEN PELVIS W CONTRAST Result Date: 12/07/2023 CLINICAL DATA:  Evaluate pancreatitis. EXAM: CT ABDOMEN AND PELVIS WITH CONTRAST TECHNIQUE: Multidetector CT imaging of the abdomen and pelvis was performed using the standard protocol following bolus administration of intravenous contrast. RADIATION DOSE REDUCTION: This exam was performed according to the departmental dose-optimization program which includes automated exposure control, adjustment of the mA and/or kV according to patient size and/or use of iterative reconstruction technique. CONTRAST:  75mL OMNIPAQUE  IOHEXOL  350 MG/ML SOLN COMPARISON:  05/08/2023 FINDINGS: Lower chest: No pleural fluid or consolidation. Dependent changes noted within the posterior lung bases. Hepatobiliary: No focal liver abnormality is seen. No gallstones, gallbladder wall thickening, or biliary dilatation. Pancreas: Signs of acute on chronic pancreatitis identified. There is main duct dilatation measuring up to 8.6 mm at the pancreatic neck. Coarse calcifications within the head of pancreas. There is diffuse edema and soft tissue stranding about the head of pancreas extending into the lesser sac and porta hepatic regions. Previous pseudo cyst within the head of pancreas measures 2.1 by 3.3 cm and 16 Hounsfield units, image 45/3. Previously this measured 3.7 x 2.6 cm with 68 Hounsfield units. There is new abnormal low-attenuation wall thickening involving the gastric antrum. Along the posterior and inferior wall of the gastric antrum there is a new complex fluid collection which measures 5.0 x 3.7 cm and 51 Hounsfield units, image 45/3. Spleen: Normal in size without focal abnormality. Adrenals/Urinary Tract: Normal adrenal glands. No kidney mass or obstructive uropathy. Urinary bladder appears normal. Stomach/Bowel: No pathologic dilatation of the large or small bowel loops to suggest obstruction. The appendix is visualized and appears  normal. Mild wall thickening with surrounding soft tissue stranding is noted involving the transverse colon which is favored to represent secondary inflammation. Vascular/Lymphatic: Normal appearance of the abdominal aorta. Patent upper abdominal vascularity. No signs of abdominopelvic adenopathy. Reproductive: Prostate is unremarkable. Other: Small volume of free fluid is identified extending over the right lobe of liver, and in the dependent portion of the pelvis. No signs of pneumoperitoneum. Musculoskeletal: No acute or significant osseous findings.  IMPRESSION:  1. Signs of acute on chronic pancreatitis. There is diffuse edema and soft tissue stranding about the head of pancreas extending into the lesser sac and porta hepatic regions. There is new abnormal low-attenuation wall thickening involving the gastric antrum. Along the posterior and inferior wall of the gastric antrum there is a new complex fluid collection which measures 5.0 x 3.7 cm and 51 Hounsfield units. Findings are concerning for acute on chronic pancreatitis with pseudocyst formation recommend follow-up imaging following resolution of this acute episode to ensure resolution.  2. Mild wall thickening with surrounding soft tissue stranding is noted involving the transverse colon which is favored to represent secondary inflammation.  3. Small volume of free fluid is identified extending over the right lobe of liver, and in the dependent portion of the pelvis.  Electronically Signed   By: Waddell Calk M.D.   On: 12/07/2023 09:11   Data Reviewed: Relevant notes from primary care and specialist visits, past discharge summaries as available in EHR, including Care Everywhere . Prior diagnostic testing as pertinent to current admission diagnoses, Updated medications and problem lists for reconciliation .ED course, including vitals, labs,  imaging, treatment and response to treatment,Triage notes, nursing and pharmacy notes and ED provider's  notes.Notable results as noted in HPI.Discussed case with EDMD/ ED APP/ or Specialty MD on call and as needed.  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 -Gall bladder is WNL on ct and no stones. Alk phos and bili are wnl . -IVF/ clear diet / antiemetic and IV PPI for gastric antrum wall thickening.  -PT has h/o pseudocyst but this CT identifies it as new finding 5 x 3.7 CM and significant size will defer to gi for additional recommendation.  - Gi consulted- [facility].    >>History of bicuspid aortic valve with severe aortic insufficiency and moderate aortic stenosis status post mechanical aortic valve replacement and aortoplasty on 09/27/2021: - INR today is subtherapeutic at 1.1 - coumadin  pharmacy consult and const aspirin .     >>Allergies/ asthma: - d/w pt to get allergy tested and start receiving appropriate therapy.  - treating allergies will improve quality of life and prevent worsening or developing other allergies pt verbalized understanding.   >>Htn: - Cont metoprolol .     >> Preexposure prophylaxis for HIV: -Discussed with patient that the Truvada he is on is not directly associated with pancreatitis however in patients with chronic pancreatitis has a possibility of worsening inflammation and needs to be monitored.  -we will continue for now lactic is normal .    DVT prophylaxis:  Coumadin   Consults:  None  Advance Care Planning:    Code Status: Full Code   Family Communication:  None  Disposition Plan:  Home  Severity of Illness: The appropriate patient status for this patient is OBSERVATION. Observation status is judged to be reasonable and necessary in order to provide the required intensity of service to ensure the patient's safety. The patient's presenting symptoms, physical exam findings, and initial radiographic and laboratory data in the context of their medical  condition is felt to place them at decreased risk for further clinical deterioration. Furthermore, it is anticipated that the patient will be medically stable for discharge from the hospital within 2 midnights of admission.   Unresulted Labs (From admission, onward)     Start     Ordered   12/08/23 0500  Comprehensive metabolic panel  Tomorrow morning,   R        12/07/23 1258   12/08/23 0500  CBC  Tomorrow morning,   R        12/07/23 1258   12/07/23 1313  Gamma GT  Add-on,   AD        12/07/23 1312   12/07/23 1311  Hemoglobin A1c  Add-on,   AD        12/07/23 1310   12/07/23 1244  Magnesium   Once,   STAT        12/07/23 1244   12/07/23 1230  Lipid panel  Add-on,   AD        12/07/23 1229            Meds ordered this encounter  Medications   ondansetron  (ZOFRAN -ODT) disintegrating tablet 4 mg   oxyCODONE -acetaminophen  (PERCOCET/ROXICET) 5-325 MG per tablet 1 tablet    Refill:  0   lactated ringers  bolus 1,000 mL   ondansetron  (ZOFRAN ) injection 4 mg   HYDROmorphone  (DILAUDID ) injection 1 mg   lactated ringers  bolus 1,000 mL   HYDROmorphone  (DILAUDID ) injection 1 mg   iohexol  (OMNIPAQUE ) 350 MG/ML  injection 75 mL   pantoprazole  (PROTONIX ) injection 40 mg   metoprolol  succinate (TOPROL -XL) 24 hr tablet 12.5 mg   lactated ringers  infusion   OR Linked Order Group    acetaminophen  (TYLENOL ) tablet 650 mg    acetaminophen  (TYLENOL ) suppository 650 mg   morphine  (PF) 2 MG/ML injection 2 mg   OR Linked Order Group    ondansetron  (ZOFRAN ) tablet 4 mg    ondansetron  (ZOFRAN ) injection 4 mg   emtricitabine-tenofovir AF (DESCOVY) 200-25 MG per tablet 1 tablet   aspirin  EC tablet 81 mg    Swallow whole.       Orders Placed This Encounter  Procedures   CT ABDOMEN PELVIS W CONTRAST   Lipase, blood   Comprehensive metabolic panel   CBC   Urinalysis, Routine w reflex microscopic -Urine, Clean Catch   Protime-INR   Lipid panel   Magnesium    Comprehensive metabolic panel    CBC   Hemoglobin A1c   Gamma GT   Diet clear liquid Room service appropriate? Yes; Fluid consistency: Thin   SCDs   Cardiac Monitoring Continuous x 24 hours Indications for use: Other; other indications for use: Monitor for ischemia   Vital signs   Notify physician (specify)   Mobility Protocol: No Restrictions RN to initiate protocols based on patient's level of care   Refer to Sidebar Report Refer to ICU, Med-Surg, Progressive, and Step-Down Mobility Protocol Sidebars   Initiate Adult Central Line Maintenance and Catheter Protocol for patients with central line (CVC, PICC, Port, Hemodialysis, Trialysis)   If patient diabetic or glucose greater than 140 notify physician for Sliding Scale Insulin  Orders   Intake and Output   Do not place and if present remove PureWick   Initiate Oral Care Protocol   Initiate Carrier Fluid Protocol   RN may order General Admission PRN Orders utilizing General Admission PRN medications (through manage orders) for the following patient needs: allergy symptoms (Claritin ), cold sores (Carmex), cough (Robitussin DM), eye irritation (Liquifilm Tears), hemorrhoids (Tucks), indigestion (Maalox), minor skin irritation (Hydrocortisone Cream), muscle pain Lucienne Gay), nose irritation (saline nasal spray) and sore throat (Chloraseptic spray).   Full code   Consult to hospitalist   Consult to gastroenterology   warfarin (COUMADIN ) per pharmacy consult   Pulse oximetry check with vital signs   Oxygen therapy Mode or (Route): Nasal cannula; Liters Per Minute: 2; Keep O2 saturation between: greater than 92 %   Incentive spirometry   I-Stat CG4 Lactic Acid   Place in observation (patient's expected length of stay will be less than 2 midnights)   Aspiration precautions    Author: Mario LULLA Blanch, MD 12 pm- 8 pm. [facility]. 12/07/2023 1:24 PM Please note for any communication after hours contact [facility] Assigned provider on call on Amion.

## 2023-12-07 NOTE — ED Provider Notes (Signed)
 [place] [facility] AT [facility] Provider Note   CSN: [id number] Arrival date & time: 12/07/23  9751     Patient presents with: Abdominal Pain   [name] [name] is a 33 y.o. male.   Patient with a history of recurrent pancreatitis of uncertain etiology.  States he has been alcohol free for the past 8 months.  Complains of epigastric pain that radiates to his back associate with diarrhea and nausea.  Is similar to previous pancreatitis flareups.  Does not normally get vomiting with his pancreatitis.  No fever but has had chills.  Several episodes of diarrhea throughout the day yesterday that were quite severe but not bloody.  He has tried Epsom salts at home and hot bath and methocarbamol  without relief.  Still has appendix and gallbladder.   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  The history is provided by the patient.  Abdominal Pain Associated symptoms: diarrhea and nausea   Associated symptoms: no cough, no dysuria, no fever, no hematuria, no shortness of breath and no vomiting        Prior to Admission medications   Medication Sig Start Date End Date Taking? Authorizing Provider  aspirin  EC 81 MG EC tablet Take 1 tablet (81 mg total) by mouth daily. Swallow whole. 10/02/21   Barrett, Erin R, [name]  dicyclomine  (BENTYL ) 10 MG capsule TAKE 2 CAPSULES(20 MG) BY MOUTH FOUR TIMES DAILY BEFORE MEALS AND AT BEDTIME 10/25/23   McMichael, Bayley M, [name]  emtricitabine-tenofovir (TRUVADA) 200-300 MG tablet Take 1 tablet by mouth daily. 11/06/23   [provider]  lipase/protease/amylase (CREON ) 36000 UNITS CPEP capsule Take 1 capsule (36,000 Units total) by mouth 3 (three) times daily before meals. 11/26/23   McMichael, Nestor HERO, [name]  loratadine  (CLARITIN ) 10 MG tablet Take 10 mg by mouth daily.    [provider]  methocarbamol  (ROBAXIN ) 500 MG tablet Take 1-2 tablets (500-1,000 mg total) by  mouth 4 (four) times daily. 11/19/23   Lucius Krabbe, NP  metoprolol  succinate (TOPROL  XL) 25 MG 24 hr tablet Take 0.5 tablets (12.5 mg total) by mouth at bedtime. 07/10/23   Thukkani, Arun K, MD  ondansetron  (ZOFRAN ) 4 MG tablet Take 1 tablet (4 mg total) by mouth every 8 (eight) hours as needed for nausea or vomiting. 04/13/23   Cheryle Page, MD  warfarin (COUMADIN ) 3 MG tablet TAKE 1 1/2 TABLETS TO 2 TABLETS BY MOUTH DAILY OR AS DIRECTED BY COAGULATION CLINIC 08/21/23   Thukkani, Arun K, MD    Allergies: Amoxicillin    Review of Systems  Constitutional:  Negative for activity change, appetite change and fever.  HENT:  Negative for congestion.   Respiratory:  Negative for cough, chest tightness and shortness of breath.   Gastrointestinal:  Positive for abdominal pain, diarrhea and nausea. Negative for vomiting.  Genitourinary:  Negative for dysuria and hematuria.  Musculoskeletal:  Negative for arthralgias and myalgias.  Skin:  Negative for rash.  Neurological:  Negative for dizziness, weakness and headaches.   all other systems are negative except as noted in the HPI and PMH.    Updated Vital Signs BP (!) 159/91 (BP Location: Right Arm)   Pulse 71   Temp 97.7 F (36.5 C)   Resp 16   Ht 5' 11 (1.803 m)   Wt 70.3 kg   SpO2 100%   BMI 21.62 kg/m   Physical Exam Vitals and nursing note reviewed.  Constitutional:      General: He is not in acute distress.    Appearance: He is well-developed.  HENT:     Head: Normocephalic and atraumatic.     Mouth/Throat:     Pharynx: No oropharyngeal exudate.   Eyes:     Conjunctiva/sclera: Conjunctivae normal.     Pupils: Pupils are equal, round, and reactive to light.   Neck:     Comments: No meningismus. Cardiovascular:     Rate and Rhythm: Normal rate and regular rhythm.     Heart sounds: Normal heart sounds. No murmur heard. Pulmonary:     Effort: Pulmonary effort is normal. No respiratory distress.     Breath sounds:  Normal breath sounds.  Abdominal:     Palpations: Abdomen is soft.     Tenderness: There is abdominal tenderness. There is no guarding or rebound.     Comments: Epigastric tenderness   Musculoskeletal:        General: No tenderness. Normal range of motion.     Cervical back: Normal range of motion and neck supple.   Skin:    General: Skin is warm.     Capillary Refill: Capillary refill takes less than 2 seconds.   Neurological:     General: No focal deficit present.     Mental Status: He is alert and oriented to person, place, and time. Mental status is at baseline.     Cranial Nerves: No cranial nerve deficit.     Motor: No abnormal muscle tone.     Coordination: Coordination normal.     Comments:  5/5 strength throughout. CN 2-12 intact.Equal grip strength.   Psychiatric:        Behavior: Behavior normal.     (all labs ordered are listed, but only abnormal results are displayed) Labs Reviewed  LIPASE, BLOOD - Abnormal; Notable for the following components:      Result Value   Lipase 136 (*)    All other components within normal limits  COMPREHENSIVE METABOLIC PANEL WITH GFR - Abnormal; Notable for the following components:   Glucose, Bld 130 (*)    All other components within normal limits  CBC  URINALYSIS, ROUTINE W REFLEX MICROSCOPIC  PROTIME-INR    EKG: None  Radiology: No results found.   Procedures   Medications Ordered in the ED  oxyCODONE -acetaminophen  (PERCOCET/ROXICET) 5-325 MG per tablet 1 tablet (1 tablet Oral Given 12/07/23 0316)  lactated ringers  bolus 1,000 mL (has no administration in time range)  ondansetron  (ZOFRAN ) injection 4 mg (has no administration in time range)  HYDROmorphone  (DILAUDID ) injection 1 mg (has no administration in time range)  lactated ringers  bolus 1,000 mL (has no administration in time range)  ondansetron  (ZOFRAN -ODT) disintegrating tablet 4 mg (4 mg Oral Given 12/07/23 0317)    Clinical Course as of 12/07/23 0720  Sat  Dec 07, 2023  0707 Handoff SR Hx pancreatitis w/ pseudocyst Typical pain today Lipase + CT pending  [SG]    Clinical Course User Index [SG] Elnor Jayson LABOR, DO                                 Medical Decision Making Amount and/or Complexity of Data Reviewed Labs: ordered. Decision-making details documented in ED Course. Radiology: ordered and independent interpretation performed. Decision-making details documented in ED Course. ECG/medicine tests: ordered and independent interpretation performed. Decision-making details documented in ED Course.  Risk Prescription drug management.  Recurrent pancreatitis here with typical symptoms including epigastric pain, nausea and diarrhea.  Stable vitals on arrival.  And soft without peritoneal signs.  Will hydrate.  Lipase on arrival was 136.  No LFT elevation.  No leukocytosis  No leukocytosis.  Normal LFTs.  Patient is hydrated and symptoms are treated with pain and nausea medications.  CT imaging will be obtained.  Will need reassessment pending symptomatic improvement for ultimate disposition.  Care to be transferred at shift change to Dr. Elnor.     Final diagnoses:  None    ED Discharge Orders     None          Monnie Anspach, Garnette, MD 12/07/23 [phone]

## 2023-12-08 DIAGNOSIS — K859 Acute pancreatitis without necrosis or infection, unspecified: Secondary | ICD-10-CM

## 2023-12-08 DIAGNOSIS — K861 Other chronic pancreatitis: Principal | ICD-10-CM

## 2023-12-08 DIAGNOSIS — K8681 Exocrine pancreatic insufficiency: Secondary | ICD-10-CM | POA: Diagnosis not present

## 2023-12-08 DIAGNOSIS — R933 Abnormal findings on diagnostic imaging of other parts of digestive tract: Secondary | ICD-10-CM | POA: Diagnosis not present

## 2023-12-08 LAB — COMPREHENSIVE METABOLIC PANEL WITH GFR
ALT: 15 U/L (ref 0–44)
AST: 15 U/L (ref 15–41)
Albumin: 3.2 g/dL — ABNORMAL LOW (ref 3.5–5.0)
Alkaline Phosphatase: 52 U/L (ref 38–126)
Anion gap: 9 (ref 5–15)
BUN: 5 mg/dL — ABNORMAL LOW (ref 6–20)
CO2: 27 mmol/L (ref 22–32)
Calcium: 8.7 mg/dL — ABNORMAL LOW (ref 8.9–10.3)
Chloride: 104 mmol/L (ref 98–111)
Creatinine, Ser: 0.7 mg/dL (ref 0.61–1.24)
GFR, Estimated: >60 mL/min (ref >60)
Glucose, Bld: 122 mg/dL — ABNORMAL HIGH (ref 70–99)
Potassium: 3.8 mmol/L (ref 3.5–5.1)
Sodium: 140 mmol/L (ref 135–145)
Total Bilirubin: 0.4 mg/dL (ref 0.0–1.2)
Total Protein: 5.8 g/dL — ABNORMAL LOW (ref 6.5–8.1)

## 2023-12-08 LAB — CBC
HCT: 35.3 % — ABNORMAL LOW (ref 39.0–52.0)
Hemoglobin: 11.9 g/dL — ABNORMAL LOW (ref 13.0–17.0)
MCH: 30.1 pg (ref 26.0–34.0)
MCHC: 33.7 g/dL (ref 30.0–36.0)
MCV: 89.4 fL (ref 80.0–100.0)
Platelets: 265 10*3/uL (ref 150–400)
RBC: 3.95 MIL/uL — ABNORMAL LOW (ref 4.22–5.81)
RDW: 13.2 % (ref 11.5–15.5)
WBC: 5.5 10*3/uL (ref 4.0–10.5)
nRBC: 0 % (ref 0.0–0.2)

## 2023-12-08 LAB — PROTIME-INR
INR: 1.5 — ABNORMAL HIGH (ref 0.8–1.2)
Prothrombin Time: 18.5 s — ABNORMAL HIGH (ref 11.4–15.2)

## 2023-12-08 MED ORDER — PANCRELIPASE (LIP-PROT-AMYL) 36000-114000 UNITS PO CPEP: 36000.0000 [IU] | ORAL_CAPSULE | Freq: Two times a day (BID) | ORAL | Status: DC | PRN

## 2023-12-08 MED ORDER — PANCRELIPASE (LIP-PROT-AMYL) 36000-114000 UNITS PO CPEP
72000.0000 [IU] | ORAL_CAPSULE | Freq: Three times a day (TID) | ORAL | Status: DC
  Administered 2023-12-08 – 2023-12-09 (×4): 72000 [IU] via ORAL
  Filled 2023-12-08 (×6): qty 2

## 2023-12-08 MED ORDER — PANCRELIPASE (LIP-PROT-AMYL) 36000-114000 UNITS PO CPEP
36000.0000 [IU] | ORAL_CAPSULE | Freq: Three times a day (TID) | ORAL | Status: DC
  Filled 2023-12-08: qty 1

## 2023-12-08 MED ORDER — OXYCODONE HCL 5 MG PO TABS: 5.0000 mg | ORAL_TABLET | ORAL | Status: DC | PRN

## 2023-12-08 MED ORDER — WARFARIN SODIUM 4 MG PO TABS
4.5000 mg | ORAL_TABLET | Freq: Once | ORAL | Status: AC
  Administered 2023-12-08: 4.5 mg via ORAL
  Filled 2023-12-08: qty 1

## 2023-12-08 MED ORDER — MORPHINE SULFATE (PF) 2 MG/ML IV SOLN
2.0000 mg | INTRAVENOUS | Status: DC | PRN
  Administered 2023-12-08 – 2023-12-09 (×4): 2 mg via INTRAVENOUS
  Filled 2023-12-08 (×4): qty 1

## 2023-12-08 NOTE — Progress Notes (Signed)
 PHARMACY - ANTICOAGULATION CONSULT NOTE  Pharmacy Consult for warfarin  Indication: AVR  Allergies  Allergen Reactions   Amoxicillin Hives    Patient Measurements: Height: 5' 11 (180.3 cm) Weight: 70.3 kg (155 lb) IBW/kg (Calculated) : 75.3 HEPARIN  DW (KG): 70.3  Vital Signs: Temp: 97.6 F (36.4 C) (06/29 0908) Temp Source: Oral (06/29 0908) BP: 135/89 (06/29 0908) Pulse Rate: 63 (06/29 0908)  Labs: Recent Labs    12/07/23 0318 12/07/23 0603 12/08/23 0933  HGB 13.1  --  11.9*  HCT 41.0  --  35.3*  PLT 315  --  265  LABPROT  --  14.9 18.5*  INR  --  1.1 1.5*  CREATININE 0.75  --  0.70    Estimated Creatinine Clearance: 131.8 mL/min (by C-G formula based on SCr of 0.7 mg/dL).   Medical History: Past Medical History:  Diagnosis Date   Asthma    Headache    migraines   Heart murmur    Hypokalemia 06/08/2020   Leukocytosis 01/23/2022   Mild aortic stenosis    Pancreatitis    Pneumonia    as a child x several   Substance abuse (HCC)     Assessment: [name] [name] is a 33 y.o. year old male presented on 12/07/2023 with concern for abdominal pain found to have acute on chronic pancreatitis. On warfarin prior to admission for AVR (on-X valve). Prior to admission regimen, 6mg  MW and 4.5mg  all other days. Last dose prior to admission 6/24 - noted to have run out of prescription. Pharmacy consulted for warfarin inpatient management.   -INR 1.1> 1.5  Goal of Therapy:  INR goal 1.5-2 Monitor platelets by anticoagulation protocol: Yes   Plan:  Warfarin 4.5mg  today  F/u daily INR  Thank you for allowing pharmacy to participate in this patient's care.   Prentice Poisson, PharmD Clinical Pharmacist **Pharmacist phone directory can now be found on [email or web address] ([email or web address]).  Listed under [facility].

## 2023-12-08 NOTE — Progress Notes (Addendum)
 PROGRESS NOTE    [name] [name]  FMW:[id number] DOB: 03-Aug-1990 DOA: 12/07/2023 PCP: [name] Krabbe, NP  Chief Complaint  Patient presents with   Abdominal Pain    Brief Narrative:   [name] [name] is [name] [name] 33 y.o. male with past medical history  of  asthma, GERD , hypertension, bicuspid aortic valve with severe aortic insufficiency and moderate aortic stenosis status post mechanical aortic valve replacement and aortoplasty on 09/27/2021 with goal INR between 1.5-2 on Coumadin  and aspirin  indefinitely, asthma, recurrent multiple episodes of acute pancreatitis since 2021  2/2 to alcohol abuse which he has stopped since 8 months comes for abd pain abdominal pain radiating to his back.   and intractable nausea. No vomiting , fever chills or any other complaints.   Assessment & Plan:   Principal Problem:   Abdominal pain Active Problems:   Chronic pancreatitis (HCC)   Pseudocyst of pancreas   S/P AVR (aortic valve replacement) and aortoplasty   Essential hypertension   Acute on chronic pancreatitis (HCC)   Gastritis and gastroduodenitis   On pre-exposure prophylaxis for HIV   Abnormal finding on GI tract imaging  Acute on Chronic Pancreatitis Complex Fluid Collection  Lipase 136, CT with acute on chronic pancreatitis - diffuse edema and soft tissue stranding about the head of the pancreatitis extending into the lesser sac and porta hepatic regions.  New abnormal low attenutation wall thickening involving the gastric antrum.  New complex fluid collection which measures 5.0 x 3.7 cm.  Concerning for acute on chronic pancreatitis with pseudocyst formation - warrants follow up imaging.   Continue pain meds, IVF, creon  GI c/s, appreciate recs  Hx Biscuspid Aortic Valve with Severe Aortic Insufficiency and Moderate Aortic Stenosis s/p Mechanical AV Replacement and Aortoplasty on 4/19 Subtherapeutic INR (goal 1.5-2) Coumadin  per pharmacy Aspirin     HTN Metoprolol   PrEP On descovy  Secondary Inflammation involving the Transverse Colon Noted  Allergies Follow outpatient    DVT prophylaxis: warfarin Code Status: full Family Communication: none Disposition:   Status is: Observation The patient remains OBS appropriate and will d/c before 2 midnights.   Consultants:  GI  Procedures:  none  Antimicrobials:  Anti-infectives (From admission, onward)    Start     Dose/Rate Route Frequency Ordered Stop   12/07/23 1315  emtricitabine-tenofovir AF (DESCOVY) 200-25 MG per tablet 1 tablet        1 tablet Oral Daily 12/07/23 1306         Subjective: Feels about the same as yesterday  Objective: Vitals:   12/07/23 2025 12/08/23 0006 12/08/23 0419 12/08/23 0908  BP: 129/83 119/81 118/78 135/89  Pulse: 64 (!) 57 60 63  Resp: 18 16 17 16   Temp: 98.3 F (36.8 C) 97.8 F (36.6 C) (!) 97.3 F (36.3 C) 97.6 F (36.4 C)  TempSrc: Oral Oral Oral Oral  SpO2: 98% 99% 97% 99%  Weight:      Height:        Intake/Output Summary (Last 24 hours) at 12/08/2023 1537 Last data filed at 12/07/2023 2057 Gross per 24 hour  Intake 50 ml  Output --  Net 50 ml   Filed Weights   12/07/23 0310  Weight: 70.3 kg    Examination:  General exam: Appears calm and comfortable  Respiratory system: unlabored Cardiovascular system: RRR. Gastrointestinal system: Abdomen is nondistended, soft and nontender.  Central nervous system: Alert and oriented. No focal neurological deficits. Extremities: no LEE   Data Reviewed: I have  personally reviewed following labs and imaging studies  CBC: Recent Labs  Lab 12/07/23 0318 12/08/23 0933  WBC 7.2 5.5  HGB 13.1 11.9*  HCT 41.0 35.3*  MCV 90.3 89.4  PLT 315 265    Basic Metabolic Panel: Recent Labs  Lab 12/07/23 0318 12/07/23 1244 12/08/23 0933  NA 141  --  140  K 3.7  --  3.8  CL 105  --  104  CO2 28  --  27  GLUCOSE 130*  --  122*  BUN 8  --  5*  CREATININE 0.75  --   0.70  CALCIUM  9.1  --  8.7*  MG  --  1.8  --     GFR: Estimated Creatinine Clearance: 131.8 mL/min (by C-G formula based on SCr of 0.7 mg/dL).  Liver Function Tests: Recent Labs  Lab 12/07/23 0318 12/08/23 0933  AST 19 15  ALT 21 15  ALKPHOS 64 52  BILITOT 0.6 0.4  PROT 7.0 5.8*  ALBUMIN  3.9 3.2*    CBG: No results for input(s): GLUCAP in the last 168 hours.   No results found for this or any previous visit (from the past 240 hours).       Radiology Studies: CT ABDOMEN PELVIS W CONTRAST Result Date: 12/07/2023 CLINICAL DATA:  Evaluate pancreatitis. EXAM: CT ABDOMEN AND PELVIS WITH CONTRAST TECHNIQUE: Multidetector CT imaging of the abdomen and pelvis was performed using the standard protocol following bolus administration of intravenous contrast. RADIATION DOSE REDUCTION: This exam was performed according to the departmental dose-optimization program which includes automated exposure control, adjustment of the mA and/or kV according to patient size and/or use of iterative reconstruction technique. CONTRAST:  75mL OMNIPAQUE  IOHEXOL  350 MG/ML SOLN COMPARISON:  05/08/2023 FINDINGS: Lower chest: No pleural fluid or consolidation. Dependent changes noted within the posterior lung bases. Hepatobiliary: No focal liver abnormality is seen. No gallstones, gallbladder wall thickening, or biliary dilatation. Pancreas: Signs of acute on chronic pancreatitis identified. There is main duct dilatation measuring up to 8.6 mm at the pancreatic neck. Coarse calcifications within the head of pancreas. There is diffuse edema and soft tissue stranding about the head of pancreas extending into the lesser sac and porta hepatic regions. Previous pseudo cyst within the head of pancreas measures 2.1 by 3.3 cm and 16 Hounsfield units, image 45/3. Previously this measured 3.7 x 2.6 cm with 68 Hounsfield units. There is new abnormal low-attenuation wall thickening involving the gastric antrum. Along the  posterior and inferior wall of the gastric antrum there is [name] new complex fluid collection which measures 5.0 x 3.7 cm and 51 Hounsfield units, image 45/3. Spleen: Normal in size without focal abnormality. Adrenals/Urinary Tract: Normal adrenal glands. No kidney mass or obstructive uropathy. Urinary bladder appears normal. Stomach/Bowel: No pathologic dilatation of the large or small bowel loops to suggest obstruction. The appendix is visualized and appears normal. Mild wall thickening with surrounding soft tissue stranding is noted involving the transverse colon which is favored to represent secondary inflammation. Vascular/Lymphatic: Normal appearance of the abdominal aorta. Patent upper abdominal vascularity. No signs of abdominopelvic adenopathy. Reproductive: Prostate is unremarkable. Other: Small volume of free fluid is identified extending over the right lobe of liver, and in the dependent portion of the pelvis. No signs of pneumoperitoneum. Musculoskeletal: No acute or significant osseous findings. IMPRESSION: 1. Signs of acute on chronic pancreatitis. There is diffuse edema and soft tissue stranding about the head of pancreas extending into the lesser sac and porta hepatic regions. There is  new abnormal low-attenuation wall thickening involving the gastric antrum. Along the posterior and inferior wall of the gastric antrum there is Jianna Drabik new complex fluid collection which measures 5.0 x 3.7 cm and 51 Hounsfield units. Findings are concerning for acute on chronic pancreatitis with pseudocyst formation recommend follow-up imaging following resolution of this acute episode to ensure resolution. 2. Mild wall thickening with surrounding soft tissue stranding is noted involving the transverse colon which is favored to represent secondary inflammation. 3. Small volume of free fluid is identified extending over the right lobe of liver, and in the dependent portion of the pelvis. Electronically Signed   By: Waddell Calk M.D.   On: 12/07/2023 09:11        Scheduled Meds:  aspirin  EC  81 mg Oral Daily   emtricitabine-tenofovir AF  1 tablet Oral Daily   lipase/protease/amylase  72,000 Units Oral TID AC   metoprolol  succinate  12.5 mg Oral QHS   pantoprazole  (PROTONIX ) IV  40 mg Intravenous Q12H   warfarin  4.5 mg Oral ONCE-1600   Warfarin - Pharmacist Dosing Inpatient   Does not apply q1600   Continuous Infusions:  lactated ringers  125 mL/hr at 12/08/23 1015     LOS: 0 days    Time spent: over 30 min    Meliton Monte, MD [facility]   To contact the attending provider between 7A-7P or the covering provider during after hours 7P-7A, please log into the web site [email or web address] and access using universal [place] password for that web site. If you do not have the password, please call the hospital operator.  12/08/2023, 3:37 PM

## 2023-12-08 NOTE — Progress Notes (Signed)
 Daily Progress Note  DOA: 12/07/2023 Hospital Day: 2   Cc:  acute on chronic pancreatitis  Brief History:  33 y.o. year old male with a medical history including but not limited to  AVR on warfarin,  chronic Etoh pancreatitis complicated by pseudocysts. Admitted 6/28 with acute on chronic pancreatitis. See 6/29 GI consult note   ASSESSMENT    Acute on chronic ETOH pancreatitis complicated by pseudocysts . Now with new fluid collection. CT scan demonstrates antral wall thickening / new complex fluid collection along the inferior wall of the gastric antrum measuring 5.0 x 3.7 cm. Additionally, there is mild wall thickening with surrounding soft tissue stranding involving the transverse colon which may be reactive.  .>>TODAY: Overall pain better than when presenting to ED but still at around 6/10. Tolerating clear liquids. Hasn't had any pain meds today. Today's labs reviewed. Hgb 13>> 11.9 but has had IVF.    History of Etoh abuse.  In remission, no Etoh in 8 months   AVR, on warfarin  INR 1.1 yesterday    Principal Problem:   Abdominal pain Active Problems:   S/P AVR (aortic valve replacement) and aortoplasty   Chronic pancreatitis (HCC)   Essential hypertension   Pseudocyst of pancreas   Acute on chronic pancreatitis (HCC)   Gastritis and gastroduodenitis   On pre-exposure prophylaxis for HIV   Abnormal finding on GI tract imaging   PLAN   --Trial of low fat diet for dinner. If tolerates then consider discharge tomorrow.  --Continue Creon  72K with meals. He doesn't need to lose any additional weight ( already down 40 pounds over the last year). He doesn't have early satiety. Seems more like he is often scared to eat for fear of having abdominal pain   Subjective   Overall pain better than when presenting to ED but still at around 6/10. Tolerating clear liquids. Had a BM today   Objective   GI Studies:    Recent Labs    12/07/23 0318 12/08/23 0933  WBC 7.2  5.5  HGB 13.1 11.9*  HCT 41.0 35.3*  MCV 90.3 89.4  PLT 315 265   No results for input(s): FOLATE, VITAMINB12, FERRITIN, TIBC, IRONPCTSAT in the last 72 hours. Recent Labs    12/07/23 0318 12/08/23 0933  NA 141 140  K 3.7 3.8  CL 105 104  CO2 28 27  GLUCOSE 130* 122*  BUN 8 5*  CREATININE 0.75 0.70  CALCIUM  9.1 8.7*   Recent Labs    12/07/23 0318 12/08/23 0933  PROT 7.0 5.8*  ALBUMIN  3.9 3.2*  AST 19 15  ALT 21 15  ALKPHOS 64 52  BILITOT 0.6 0.4      Imaging:  CT ABDOMEN PELVIS W CONTRAST CLINICAL DATA:  Evaluate pancreatitis.  EXAM: CT ABDOMEN AND PELVIS WITH CONTRAST  TECHNIQUE: Multidetector CT imaging of the abdomen and pelvis was performed using the standard protocol following bolus administration of intravenous contrast.  RADIATION DOSE REDUCTION: This exam was performed according to the departmental dose-optimization program which includes automated exposure control, adjustment of the mA and/or kV according to patient size and/or use of iterative reconstruction technique.  CONTRAST:  75mL OMNIPAQUE  IOHEXOL  350 MG/ML SOLN  COMPARISON:  05/08/2023  FINDINGS: Lower chest: No pleural fluid or consolidation. Dependent changes noted within the posterior lung bases.  Hepatobiliary: No focal liver abnormality is seen. No gallstones, gallbladder wall thickening, or biliary dilatation.  Pancreas: Signs of acute on chronic pancreatitis identified. There is  main duct dilatation measuring up to 8.6 mm at the pancreatic neck. Coarse calcifications within the head of pancreas. There is diffuse edema and soft tissue stranding about the head of pancreas extending into the lesser sac and porta hepatic regions. Previous pseudo cyst within the head of pancreas measures 2.1 by 3.3 cm and 16 Hounsfield units, image 45/3. Previously this measured 3.7 x 2.6 cm with 68 Hounsfield units. There is new abnormal low-attenuation wall thickening involving the  gastric antrum. Along the posterior and inferior wall of the gastric antrum there is a new complex fluid collection which measures 5.0 x 3.7 cm and 51 Hounsfield units, image 45/3.  Spleen: Normal in size without focal abnormality.  Adrenals/Urinary Tract: Normal adrenal glands. No kidney mass or obstructive uropathy. Urinary bladder appears normal.  Stomach/Bowel: No pathologic dilatation of the large or small bowel loops to suggest obstruction. The appendix is visualized and appears normal. Mild wall thickening with surrounding soft tissue stranding is noted involving the transverse colon which is favored to represent secondary inflammation.  Vascular/Lymphatic: Normal appearance of the abdominal aorta. Patent upper abdominal vascularity. No signs of abdominopelvic adenopathy.  Reproductive: Prostate is unremarkable.  Other: Small volume of free fluid is identified extending over the right lobe of liver, and in the dependent portion of the pelvis. No signs of pneumoperitoneum.  Musculoskeletal: No acute or significant osseous findings.  IMPRESSION: 1. Signs of acute on chronic pancreatitis. There is diffuse edema and soft tissue stranding about the head of pancreas extending into the lesser sac and porta hepatic regions. There is new abnormal low-attenuation wall thickening involving the gastric antrum. Along the posterior and inferior wall of the gastric antrum there is a new complex fluid collection which measures 5.0 x 3.7 cm and 51 Hounsfield units. Findings are concerning for acute on chronic pancreatitis with pseudocyst formation recommend follow-up imaging following resolution of this acute episode to ensure resolution. 2. Mild wall thickening with surrounding soft tissue stranding is noted involving the transverse colon which is favored to represent secondary inflammation. 3. Small volume of free fluid is identified extending over the right lobe of liver, and in the  dependent portion of the pelvis.  Electronically Signed   By: Waddell Calk M.D.   On: 12/07/2023 09:11     Scheduled inpatient medications:   aspirin  EC  81 mg Oral Daily   emtricitabine-tenofovir AF  1 tablet Oral Daily   lipase/protease/amylase  72,000 Units Oral TID AC   metoprolol  succinate  12.5 mg Oral QHS   pantoprazole  (PROTONIX ) IV  40 mg Intravenous Q12H   warfarin  4.5 mg Oral ONCE-1600   Warfarin - Pharmacist Dosing Inpatient   Does not apply q1600   Continuous inpatient infusions:   lactated ringers  125 mL/hr at 12/08/23 1015   PRN inpatient medications: acetaminophen  **OR** acetaminophen , lipase/protease/amylase, morphine  injection, ondansetron  **OR** ondansetron  (ZOFRAN ) IV  Vital signs in last 24 hours: Temp:  [97.3 F (36.3 C)-98.3 F (36.8 C)] 97.6 F (36.4 C) (06/29 0908) Pulse Rate:  [57-85] 63 (06/29 0908) Resp:  [14-18] 16 (06/29 0908) BP: (118-150)/(78-91) 135/89 (06/29 0908) SpO2:  [97 %-100 %] 99 % (06/29 0908) Last BM Date : 12/06/23  Intake/Output Summary (Last 24 hours) at 12/08/2023 1358 Last data filed at 12/07/2023 2057 Gross per 24 hour  Intake 50 ml  Output --  Net 50 ml    Intake/Output from previous day: 06/28 0701 - 06/29 0700 In: 50 [P.O.:50] Out: -  Intake/Output this shift: No  intake/output data recorded.   Physical Exam:  General: Alert male in NAD Heart:  Regular rate and rhythm.  Pulmonary: Normal respiratory effort Abdomen: Soft, nondistended, nontender. Normal bowel sounds. Extremities: No lower extremity edema  Neurologic: Alert and oriented Psych: Pleasant. Cooperative     LOS: 0 days   Vina Dasen ,NP 12/08/2023, 1:58 PM

## 2023-12-08 NOTE — Plan of Care (Signed)

## 2023-12-08 NOTE — Plan of Care (Signed)
   Problem: Education: Goal: Knowledge of General Education information will improve Description Including pain rating scale, medication(s)/side effects and non-pharmacologic comfort measures Outcome: Progressing

## 2023-12-09 DIAGNOSIS — K8681 Exocrine pancreatic insufficiency: Secondary | ICD-10-CM | POA: Diagnosis not present

## 2023-12-09 DIAGNOSIS — K859 Acute pancreatitis without necrosis or infection, unspecified: Secondary | ICD-10-CM | POA: Diagnosis not present

## 2023-12-09 DIAGNOSIS — K863 Pseudocyst of pancreas: Secondary | ICD-10-CM | POA: Diagnosis not present

## 2023-12-09 DIAGNOSIS — K861 Other chronic pancreatitis: Secondary | ICD-10-CM | POA: Diagnosis not present

## 2023-12-09 LAB — CBC WITH DIFFERENTIAL/PLATELET
Abs Immature Granulocytes: 0.01 10*3/uL (ref 0.00–0.07)
Basophils Absolute: 0 10*3/uL (ref 0.0–0.1)
Basophils Relative: 0 %
Eosinophils Absolute: 0.2 10*3/uL (ref 0.0–0.5)
Eosinophils Relative: 3 %
HCT: 36.4 % — ABNORMAL LOW (ref 39.0–52.0)
Hemoglobin: 12.1 g/dL — ABNORMAL LOW (ref 13.0–17.0)
Immature Granulocytes: 0 %
Lymphocytes Relative: 21 %
Lymphs Abs: 1.6 10*3/uL (ref 0.7–4.0)
MCH: 29.3 pg (ref 26.0–34.0)
MCHC: 33.2 g/dL (ref 30.0–36.0)
MCV: 88.1 fL (ref 80.0–100.0)
Monocytes Absolute: 0.7 10*3/uL (ref 0.1–1.0)
Monocytes Relative: 9 %
Neutro Abs: 5.1 10*3/uL (ref 1.7–7.7)
Neutrophils Relative %: 67 %
Platelets: 254 10*3/uL (ref 150–400)
RBC: 4.13 MIL/uL — ABNORMAL LOW (ref 4.22–5.81)
RDW: 13.3 % (ref 11.5–15.5)
WBC: 7.7 10*3/uL (ref 4.0–10.5)
nRBC: 0 % (ref 0.0–0.2)

## 2023-12-09 LAB — PHOSPHORUS: Phosphorus: 2.8 mg/dL (ref 2.5–4.6)

## 2023-12-09 LAB — COMPREHENSIVE METABOLIC PANEL WITH GFR
ALT: 13 U/L (ref 0–44)
AST: 13 U/L — ABNORMAL LOW (ref 15–41)
Albumin: 3 g/dL — ABNORMAL LOW (ref 3.5–5.0)
Alkaline Phosphatase: 50 U/L (ref 38–126)
Anion gap: 7 (ref 5–15)
BUN: <5 mg/dL — ABNORMAL LOW (ref 6–20)
CO2: 29 mmol/L (ref 22–32)
Calcium: 8.2 mg/dL — ABNORMAL LOW (ref 8.9–10.3)
Chloride: 101 mmol/L (ref 98–111)
Creatinine, Ser: 0.62 mg/dL (ref 0.61–1.24)
GFR, Estimated: >60 mL/min (ref >60)
Glucose, Bld: 107 mg/dL — ABNORMAL HIGH (ref 70–99)
Potassium: 3.8 mmol/L (ref 3.5–5.1)
Sodium: 137 mmol/L (ref 135–145)
Total Bilirubin: 0.6 mg/dL (ref 0.0–1.2)
Total Protein: 5.5 g/dL — ABNORMAL LOW (ref 6.5–8.1)

## 2023-12-09 LAB — PROTIME-INR
INR: 2.6 — ABNORMAL HIGH (ref 0.8–1.2)
Prothrombin Time: 28.8 s — ABNORMAL HIGH (ref 11.4–15.2)

## 2023-12-09 LAB — MAGNESIUM: Magnesium: 1.7 mg/dL (ref 1.7–2.4)

## 2023-12-09 MED ORDER — PANCRELIPASE (LIP-PROT-AMYL) 36000-114000 UNITS PO CPEP: 72000.0000 [IU] | ORAL_CAPSULE | Freq: Three times a day (TID) | ORAL | Status: DC

## 2023-12-09 MED ORDER — OXYCODONE HCL 5 MG PO TABS
5.0000 mg | ORAL_TABLET | Freq: Four times a day (QID) | ORAL | 0 refills | Status: AC | PRN
Start: 2023-12-09 — End: 2023-12-12

## 2023-12-09 NOTE — Progress Notes (Signed)
 PHARMACY - ANTICOAGULATION CONSULT NOTE  Pharmacy Consult for warfarin  Indication: AVR  Allergies  Allergen Reactions   Amoxicillin Hives    Patient Measurements: Height: 5' 11 (180.3 cm) Weight: 70.3 kg (155 lb) IBW/kg (Calculated) : 75.3 HEPARIN  DW (KG): 70.3  Vital Signs: Temp: 98.1 F (36.7 C) (06/29 2055) Temp Source: Oral (06/29 2055) BP: 125/91 (06/29 2055) Pulse Rate: 69 (06/29 2055)  Labs: Recent Labs    12/07/23 0318 12/07/23 0603 12/08/23 0933 12/09/23 0643  HGB 13.1  --  11.9* 12.1*  HCT 41.0  --  35.3* 36.4*  PLT 315  --  265 254  LABPROT  --  14.9 18.5* 28.8*  INR  --  1.1 1.5* 2.6*  CREATININE 0.75  --  0.70 0.62    Estimated Creatinine Clearance: 131.8 mL/min (by C-G formula based on SCr of 0.62 mg/dL).   Medical History: Past Medical History:  Diagnosis Date   Asthma    Headache    migraines   Heart murmur    Hypokalemia 06/08/2020   Leukocytosis 01/23/2022   Mild aortic stenosis    Pancreatitis    Pneumonia    as a child x several   Substance abuse (HCC)     Assessment: [name] [name] is a 33 y.o. year old male presented on 12/07/2023 with concern for abdominal pain found to have acute on chronic pancreatitis. On warfarin prior to admission for AVR (on-X valve). Prior to admission regimen, 6mg  MW and 4.5mg  all other days. Last dose prior to admission 6/24 - noted to have run out of prescription. Pharmacy consulted for warfarin inpatient management.   INR uptrended from 1.5 > 2.6 today. Patient received 10mg  warfarin on 6/28 and then 4.5mg  on 6/29.   Goal of Therapy:  INR goal 1.5-2 Monitor platelets by anticoagulation protocol: Yes   Plan:  INR supra-therapeutic today.  Hold warfarin for today - anticipate INR may continue to uptrend from 10mg  dose on 6/28.  F/u daily INR  Thank you for allowing pharmacy to participate in this patient's care.   Powell Blush, PharmD, BCCCP  Clinical Pharmacist **Pharmacist  phone directory can now be found on [email or web address] ([email or web address]).  Listed under [facility].

## 2023-12-09 NOTE — TOC Initial Note (Signed)
 Transition of Care (TOC) - Initial/Assessment Note   Spoke to patient at bedside. Patient from home alone. Has family and friends close by if he needs any assistance. Independent prior to admission. Did not use any DME.  PCP Corean Comment     Transition of Care Department [facility]) has reviewed patient and no TOC needs have been identified at this time. We will continue to monitor patient advancement through interdisciplinary progression rounds. If new patient transition needs arise, please place a TOC consult.   Patient Details  Name: [name] [name] MRN: [id number] Date of Birth: Oct 04, 1990  Transition of Care [facility]) CM/SW Contact:    Stephane Powell Jansky, RN Phone Number: 12/09/2023, 3:44 PM  Clinical Narrative:                   Expected Discharge Plan: Home/Self Care Barriers to Discharge: Continued Medical Work up   Patient Goals and CMS Choice Patient states their goals for this hospitalization and ongoing recovery are:: to return to home          Expected Discharge Plan and Services In-house Referral: NA Discharge Planning Services: CM Consult Post Acute Care Choice: NA Living arrangements for the past 2 months: Single Family Home                 DME Arranged: N/A DME Agency: NA       HH Arranged: NA HH Agency: NA        Prior Living Arrangements/Services Living arrangements for the past 2 months: Single Family Home Lives with:: Self Patient language and need for interpreter reviewed:: Yes Do you feel safe going back to the place where you live?: Yes      Need for Family Participation in Patient Care: No (Comment) Care giver support system in place?: Yes (comment)   Criminal Activity/Legal Involvement Pertinent to Current Situation/Hospitalization: No - Comment as needed  Activities of Daily Living   ADL Screening (condition at time of admission) Independently performs ADLs?: Yes (appropriate for developmental age) Is the patient deaf  or have difficulty hearing?: No Does the patient have difficulty seeing, even when wearing glasses/contacts?: No Does the patient have difficulty concentrating, remembering, or making decisions?: No  Permission Sought/Granted   Permission granted to share information with : No              Emotional Assessment Appearance:: Appears stated age Attitude/Demeanor/Rapport: Engaged Affect (typically observed): Appropriate Orientation: : Oriented to Self, Oriented to Place, Oriented to  Time, Oriented to Situation Alcohol / Substance Use: Not Applicable Psych Involvement: No (comment)  Admission diagnosis:  Abdominal pain [R10.9] Acute on chronic pancreatitis (HCC) [K85.90, K86.1] Patient Active Problem List   Diagnosis Date Noted   On pre-exposure prophylaxis for HIV 12/07/2023   Abdominal pain 12/07/2023   Abnormal finding on GI tract imaging 12/07/2023   Dilation of pancreatic duct 08/01/2023   Pancreatic cyst 08/01/2023   Gastritis and gastroduodenitis 08/01/2023   Acute on chronic pancreatitis (HCC) 04/11/2023   Pseudocyst of pancreas 04/03/2023   Gastroesophageal reflux disease without esophagitis 03/05/2023   Situational anxiety 03/05/2023   Chronic pancreatitis (HCC) 01/23/2022   Essential hypertension 01/23/2022   Long term (current) use of anticoagulants 10/04/2021   S/P AVR (aortic valve replacement) and aortoplasty 09/27/2021   Unilateral inguinal hernia without obstruction or gangrene 07/28/2021   Transaminitis 06/08/2020   Aortic valve calcification 06/08/2020   Mild aortic stenosis 11/01/2016   Bicuspid aortic valve 11/01/2016   PCP:  Lucius Krabbe, NP Pharmacy:   [facility] [address], [place] - [address] AT [facility] [address] [place] [address] Phone: [phone] Fax: [phone]  [facility] [address] [place], [place] - [address] [place] AT [facility] [address] [place] [address] Phone: [phone] Fax: [phone]  [id number] - [place], [place] - [address] AT [facility] OF [facility] [address] & [address] [place] [address] Phone: [phone] Fax: [phone]  [id number] [place], [place] - [address] AT [facility] OF [address] & [address] [place] [address] Phone: [phone] Fax: [phone]  [id number] - [place], [place] - [address] AT [facility] [address] [place] [address] Phone: [phone] Fax: [phone]     Social Drivers of Health (SDOH) Social History: SDOH Screenings   Food Insecurity: No Food Insecurity (12/07/2023)  Housing: Low Risk  (12/07/2023)  Transportation Needs: No Transportation Needs (12/07/2023)  Utilities: Not At Risk (12/07/2023)  Depression (PHQ2-9): Medium Risk (09/09/2023)  Financial Resource Strain: Low Risk  (04/25/2023)  Physical Activity: Sufficiently Active (04/25/2023)  Social Connections: Unknown (04/25/2023)  Stress: No Stress Concern Present (04/25/2023)  Tobacco Use: Low Risk  (12/07/2023)   SDOH Interventions:     Readmission Risk Interventions     No data to display

## 2023-12-09 NOTE — Progress Notes (Signed)
 Patient ID: [name] [name], male   DOB: 04-21-1991, 33 y.o.   MRN: [id number]    Progress Note   Subjective   Day # 2 CC; acute on chronic pancreatitis in setting of chronic EtOH  Status post AVR-on Coumadin   CT abdomen and pelvis 12/07/2023-consistent with acute on chronic pancreatitis with main duct dilation up to 8.6 mm at the pancreatic neck, coarse calcifications in the head of the pancreas, diffuse edema and soft tissue stranding around the head of the pancreas and previous pseudocyst in the head of the pancreas measuring 2.1 x 3.3 cm decreased in size from previous, there is new wall thickening involving the gastric antrum and along the inferior wall of the gastric antrum there is a new complex fluid collection measuring 5.0 x 3.7 cm  Labs today WBC 7.7/hemoglobin 12.1/hematocrit 36.4 INR 2.6 LFTs normal  Patient says he feels better, has used oral pain medicine once today nausea or vomiting.  Said he did have a little spike in pain after eating breakfast this morning which was full liquid.   Objective   Vital signs in last 24 hours: Temp:  [97.7 F (36.5 C)-98.1 F (36.7 C)] 98.1 F (36.7 C) (06/29 2055) Pulse Rate:  [66-74] 74 (06/30 0844) Resp:  [16-17] 17 (06/30 0844) BP: (125-145)/(81-91) 125/81 (06/30 0844) SpO2:  [97 %-100 %] 100 % (06/30 0844) Last BM Date : 12/09/23 General:    Young white male in NAD sitting on the side of the bed getting ready to walk Heart:  Regular rate and rhythm; no murmurs Lungs: Respirations even and unlabored, lungs CTA bilaterally Abdomen:  Soft, basically nontender, normal bowel sounds. Extremities:  Without edema. Neurologic:  Alert and oriented,  grossly normal neurologically. Psych:  Cooperative. Normal mood and affect.  Intake/Output from previous day: 06/29 0701 - 06/30 0700 In: 4251.5 [P.O.:50; I.V.:4201.5] Out: -  Intake/Output this shift: No intake/output data recorded.  Lab Results: Recent Labs     12/07/23 0318 12/08/23 0933 12/09/23 0643  WBC 7.2 5.5 7.7  HGB 13.1 11.9* 12.1*  HCT 41.0 35.3* 36.4*  PLT 315 265 254   BMET Recent Labs    12/07/23 0318 12/08/23 0933 12/09/23 0643  NA 141 140 137  K 3.7 3.8 3.8  CL 105 104 101  CO2 28 27 29   GLUCOSE 130* 122* 107*  BUN 8 5* <5*  CREATININE 0.75 0.70 0.62  CALCIUM  9.1 8.7* 8.2*   LFT Recent Labs    12/09/23 0643  PROT 5.5*  ALBUMIN  3.0*  AST 13*  ALT 13  ALKPHOS 50  BILITOT 0.6   PT/INR Recent Labs    12/08/23 0933 12/09/23 0643  LABPROT 18.5* 28.8*  INR 1.5* 2.6*     Assessment / Plan:    #35 33 year old white male with previous history of EtOH abuse, currently in remission x 8 months with history of chronic pancreatitis now admitted with acute on chronic pancreatitis. CT imaging shows coarse calcifications in the head of the pancreas, evidence of acute pancreatitis and a new complex fluid collection along the inferior wall of the gastric antrum measuring 5.0 x 3.7 cm.  Additional pseudocyst in the head of the pancreas decreased in size from previous.  Clinically patient looks good , no worrisome parameters, tolerating full liquids.  #2 chronic anticoagulation-Coumadin  #3 status post aortic valve replacement  Plan; advance to low-fat diet If patient is able to eat today, and pain controlled with oral analgesics he should be okay for discharge. Discussed  again absolute indication for no alcohol use ever Continue Creon  We will arrange for outpatient follow-up with Dr. Wilhelmenia, he will need repeat imaging in about 6 weeks to follow the new fluid collection.     Principal Problem:   Abdominal pain Active Problems:   S/P AVR (aortic valve replacement) and aortoplasty   Chronic pancreatitis (HCC)   Essential hypertension   Pseudocyst of pancreas   Acute on chronic pancreatitis (HCC)   Gastritis and gastroduodenitis   On pre-exposure prophylaxis for HIV   Abnormal finding on GI tract  imaging     LOS: 0 days   [name] Steinhilber [name] 12/09/2023, 11:51 AM

## 2023-12-09 NOTE — Discharge Summary (Signed)
 Physician Discharge Summary  [name] FMW:[id number] DOB: [birth year] DOA: 12/07/2023  PCP: Lucius Krabbe, NP  Admit date: 12/07/2023 Discharge date: 12/09/2023  Time spent: 40 minutes  Recommendations for Outpatient Follow-up:  Follow outpatient CBC/CMP  Follow with GI outpatient Needs repeat abdominal imaging for pseudocyst Follow weight loss outpatient    Discharge Diagnoses:  Principal Problem:   Abdominal pain Active Problems:   Chronic pancreatitis (HCC)   Pseudocyst of pancreas   S/P AVR (aortic valve replacement) and aortoplasty   Essential hypertension   Acute on chronic pancreatitis (HCC)   Gastritis and gastroduodenitis   On pre-exposure prophylaxis for HIV   Abnormal finding on GI tract imaging   Discharge Condition: stable  Diet recommendation: heart healthy  Filed Weights   12/07/23 0310  Weight: 70.3 kg    History of present illness:   [name] [name] is [name] [name] 33 y.o. male with past medical history  of  asthma, GERD , hypertension, bicuspid aortic valve with severe aortic insufficiency and moderate aortic stenosis status post mechanical aortic valve replacement and aortoplasty on 09/27/2021 with goal INR between 1.5-2 on Coumadin  and aspirin  indefinitely, asthma, recurrent multiple episodes of acute pancreatitis since 2021  2/2 to alcohol abuse which he has stopped since 8 months comes for abd pain abdominal pain radiating to his back.   and intractable nausea. No vomiting , fever chills or any other complaints.   Admitted for acute on chronic pancreatitis.  He's gradually improved.  Stable for discharge 6/30.  See below for additional details.   Hospital Course:  Assessment and Plan:  Acute on Chronic Pancreatitis Complex Fluid Collection  Lipase 136, CT with acute on chronic pancreatitis - diffuse edema and soft tissue stranding about the head of the pancreatitis extending into the lesser sac and porta hepatic regions.   New abnormal low attenutation wall thickening involving the gastric antrum.  New complex fluid collection which measures 5.0 x 3.7 cm.  Concerning for acute on chronic pancreatitis with pseudocyst formation - warrants follow up imaging.   creon  GI c/s, appreciate recs - ok with d/c today - needs outpatient follow up  Weight Loss In setting of above, follow with PCP and GI outpatient    Hx Biscuspid Aortic Valve with Severe Aortic Insufficiency and Moderate Aortic Stenosis s/p Mechanical AV Replacement and Aortoplasty on 4/19 Subtherapeutic INR (goal 1.5-2) Coumadin  per pharmacy Aspirin     HTN Metoprolol    PrEP On descovy   Secondary Inflammation involving the Transverse Colon Noted   Allergies Follow outpatient      Procedures:  none  Consultations: GI  Discharge Exam: Vitals:   12/09/23 0844 12/09/23 1558  BP: 125/81 116/77  Pulse: 74 74  Resp: 17   Temp:  98.1 F (36.7 C)  SpO2: 100% 97%   Did ok with dinner, comfortable discharging home  General: No acute distress. Cardiovascular: RRR Lungs: unlabored Abdomen: Soft, nontender, nondistended  Neurological: Alert and oriented 3. Moves all extremities 4 with equal strength. Cranial nerves II through XII grossly intact. Extremities: No clubbing or cyanosis. No edema.  Discharge Instructions   Discharge Instructions     Call MD for:  difficulty breathing, headache or visual disturbances   Complete by: As directed    Call MD for:  extreme fatigue   Complete by: As directed    Call MD for:  hives   Complete by: As directed    Call MD for:  persistant dizziness or light-headedness   Complete  by: As directed    Call MD for:  persistant nausea and vomiting   Complete by: As directed    Call MD for:  redness, tenderness, or signs of infection (pain, swelling, redness, odor or green/yellow discharge around incision site)   Complete by: As directed    Call MD for:  severe uncontrolled pain   Complete by: As  directed    Call MD for:  temperature >100.4   Complete by: As directed    Diet - low sodium heart healthy   Complete by: As directed    Discharge instructions   Complete by: As directed    You were seen for acute on chronic pancreatitis.    You had Raejean Swinford fluid collection concerning for [name] pseudocyst seen.  You'll need follow up with gastroenterology and repeat imaging in [name] few weeks.    Follow up with gastroenterology and the dietitian about your weight.  Hopefully with improvement in your pain, you'll be able to get the calories you need.  Your INR is 2.6 today, follow with the warfarin clinic (or whoever manages your warfarin) for [name] repeat check of your INR after discharge.  Return for new, recurrent, or worsening symptoms.  Please ask your PCP to request records from this hospitalization so they know what was done and what the next steps will be.   Increase activity slowly   Complete by: As directed       Allergies as of 12/09/2023       Reactions   Amoxicillin Hives        Medication List     TAKE these medications    acetaminophen  500 MG tablet Commonly known as: TYLENOL  Take 1,000 mg by mouth every 6 (six) hours as needed for moderate pain (pain score 4-6).   aspirin  EC 81 MG tablet Take 1 tablet (81 mg total) by mouth daily. Swallow whole.   dicyclomine  10 MG capsule Commonly known as: BENTYL  TAKE 2 CAPSULES(20 MG) BY MOUTH FOUR TIMES DAILY BEFORE MEALS AND AT BEDTIME What changed: See the new instructions.   emtricitabine-tenofovir 200-300 MG tablet Commonly known as: TRUVADA Take 1 tablet by mouth daily.   lipase/protease/amylase 63999 UNITS Cpep capsule Commonly known as: Creon  Take 2 capsules (72,000 Units total) by mouth 3 (three) times daily before meals. What changed: how much to take   loratadine  10 MG tablet Commonly known as: CLARITIN  Take 10 mg by mouth daily.   methocarbamol  500 MG tablet Commonly known as: ROBAXIN  Take 1-2 tablets  (500-1,000 mg total) by mouth 4 (four) times daily.   metoprolol  succinate 25 MG 24 hr tablet Commonly known as: Toprol  XL Take 0.5 tablets (12.5 mg total) by mouth at bedtime.   ondansetron  4 MG tablet Commonly known as: Zofran  Take 1 tablet (4 mg total) by mouth every 8 (eight) hours as needed for nausea or vomiting.   oxyCODONE  5 MG immediate release tablet Commonly known as: Oxy IR/ROXICODONE  Take 1 tablet (5 mg total) by mouth every 6 (six) hours as needed for up to 3 days (pain.  I).   warfarin 3 MG tablet Commonly known as: COUMADIN  Take as directed. If you are unsure how to take this medication, talk to your nurse or doctor. Original instructions: TAKE 1 1/2 TABLETS TO 2 TABLETS BY MOUTH DAILY OR AS DIRECTED BY COAGULATION CLINIC What changed:  how much to take how to take this when to take this additional instructions       Allergies  Allergen  Reactions   Amoxicillin Hives      The results of significant diagnostics from this hospitalization (including imaging, microbiology, ancillary and laboratory) are listed below for reference.    Significant Diagnostic Studies: CT ABDOMEN PELVIS W CONTRAST Result Date: 12/07/2023 CLINICAL DATA:  Evaluate pancreatitis. EXAM: CT ABDOMEN AND PELVIS WITH CONTRAST TECHNIQUE: Multidetector CT imaging of the abdomen and pelvis was performed using the standard protocol following bolus administration of intravenous contrast. RADIATION DOSE REDUCTION: This exam was performed according to the departmental dose-optimization program which includes automated exposure control, adjustment of the mA and/or kV according to patient size and/or use of iterative reconstruction technique. CONTRAST:  75mL OMNIPAQUE  IOHEXOL  350 MG/ML SOLN COMPARISON:  05/08/2023 FINDINGS: Lower chest: No pleural fluid or consolidation. Dependent changes noted within the posterior lung bases. Hepatobiliary: No focal liver abnormality is seen. No gallstones, gallbladder  wall thickening, or biliary dilatation. Pancreas: Signs of acute on chronic pancreatitis identified. There is main duct dilatation measuring up to 8.6 mm at the pancreatic neck. Coarse calcifications within the head of pancreas. There is diffuse edema and soft tissue stranding about the head of pancreas extending into the lesser sac and porta hepatic regions. Previous pseudo cyst within the head of pancreas measures 2.1 by 3.3 cm and 16 Hounsfield units, image 45/3. Previously this measured 3.7 x 2.6 cm with 68 Hounsfield units. There is new abnormal low-attenuation wall thickening involving the gastric antrum. Along the posterior and inferior wall of the gastric antrum there is Duana Benedict new complex fluid collection which measures 5.0 x 3.7 cm and 51 Hounsfield units, image 45/3. Spleen: Normal in size without focal abnormality. Adrenals/Urinary Tract: Normal adrenal glands. No kidney mass or obstructive uropathy. Urinary bladder appears normal. Stomach/Bowel: No pathologic dilatation of the large or small bowel loops to suggest obstruction. The appendix is visualized and appears normal. Mild wall thickening with surrounding soft tissue stranding is noted involving the transverse colon which is favored to represent secondary inflammation. Vascular/Lymphatic: Normal appearance of the abdominal aorta. Patent upper abdominal vascularity. No signs of abdominopelvic adenopathy. Reproductive: Prostate is unremarkable. Other: Small volume of free fluid is identified extending over the right lobe of liver, and in the dependent portion of the pelvis. No signs of pneumoperitoneum. Musculoskeletal: No acute or significant osseous findings. IMPRESSION: 1. Signs of acute on chronic pancreatitis. There is diffuse edema and soft tissue stranding about the head of pancreas extending into the lesser sac and porta hepatic regions. There is new abnormal low-attenuation wall thickening involving the gastric antrum. Along the posterior and  inferior wall of the gastric antrum there is Vir Whetstine new complex fluid collection which measures 5.0 x 3.7 cm and 51 Hounsfield units. Findings are concerning for acute on chronic pancreatitis with pseudocyst formation recommend follow-up imaging following resolution of this acute episode to ensure resolution. 2. Mild wall thickening with surrounding soft tissue stranding is noted involving the transverse colon which is favored to represent secondary inflammation. 3. Small volume of free fluid is identified extending over the right lobe of liver, and in the dependent portion of the pelvis. Electronically Signed   By: Waddell Calk M.D.   On: 12/07/2023 09:11    Microbiology: No results found for this or any previous visit (from the past 240 hours).   Labs: Basic Metabolic Panel: Recent Labs  Lab 12/07/23 0318 12/07/23 1244 12/08/23 0933 12/09/23 0643  NA 141  --  140 137  K 3.7  --  3.8 3.8  CL 105  --  104 101  CO2 28  --  27 29  GLUCOSE 130*  --  122* 107*  BUN 8  --  5* <5*  CREATININE 0.75  --  0.70 0.62  CALCIUM  9.1  --  8.7* 8.2*  MG  --  1.8  --  1.7  PHOS  --   --   --  2.8   Liver Function Tests: Recent Labs  Lab 12/07/23 0318 12/08/23 0933 12/09/23 0643  AST 19 15 13*  ALT 21 15 13   ALKPHOS 64 52 50  BILITOT 0.6 0.4 0.6  PROT 7.0 5.8* 5.5*  ALBUMIN  3.9 3.2* 3.0*   Recent Labs  Lab 12/07/23 0318  LIPASE 136*   No results for input(s): AMMONIA in the last 168 hours. CBC: Recent Labs  Lab 12/07/23 0318 12/08/23 0933 12/09/23 0643  WBC 7.2 5.5 7.7  NEUTROABS  --   --  5.1  HGB 13.1 11.9* 12.1*  HCT 41.0 35.3* 36.4*  MCV 90.3 89.4 88.1  PLT 315 265 254   Cardiac Enzymes: No results for input(s): CKTOTAL, CKMB, CKMBINDEX, TROPONINI in the last 168 hours. BNP: BNP (last 3 results) No results for input(s): BNP in the last 8760 hours.  ProBNP (last 3 results) No results for input(s): PROBNP in the last 8760 hours.  CBG: No results for  input(s): GLUCAP in the last 168 hours.     Signed:  Meliton Monte MD.  [facility] 12/09/2023, 6:36 PM

## 2023-12-09 NOTE — Plan of Care (Signed)

## 2023-12-09 NOTE — Progress Notes (Signed)
 Removed IV-CDI. Reviewed d/c paperwork with patient and answered questions. Walked stable patient and belongings to ED entrance where he drove himself home.

## 2023-12-09 NOTE — Plan of Care (Signed)

## 2023-12-09 NOTE — Plan of Care (Signed)
  Problem: Education: Goal: Knowledge of General Education information will improve Description: Including pain rating scale, medication(s)/side effects and non-pharmacologic comfort measures 12/09/2023 1859 by Delores Kirsch, RN Outcome: Adequate for Discharge 12/09/2023 1455 by Delores Kirsch, RN Outcome: Progressing   Problem: Health Behavior/Discharge Planning: Goal: Ability to manage health-related needs will improve 12/09/2023 1859 by Delores Kirsch, RN Outcome: Adequate for Discharge 12/09/2023 1455 by Delores Kirsch, RN Outcome: Progressing   Problem: Clinical Measurements: Goal: Ability to maintain clinical measurements within normal limits will improve 12/09/2023 1859 by Delores Kirsch, RN Outcome: Adequate for Discharge 12/09/2023 1455 by Delores Kirsch, RN Outcome: Progressing Goal: Will remain free from infection Outcome: Adequate for Discharge Goal: Diagnostic test results will improve Outcome: Adequate for Discharge Goal: Respiratory complications will improve Outcome: Adequate for Discharge Goal: Cardiovascular complication will be avoided Outcome: Adequate for Discharge   Problem: Activity: Goal: Risk for activity intolerance will decrease Outcome: Adequate for Discharge   Problem: Nutrition: Goal: Adequate nutrition will be maintained Outcome: Adequate for Discharge   Problem: Coping: Goal: Level of anxiety will decrease Outcome: Adequate for Discharge   Problem: Elimination: Goal: Will not experience complications related to bowel motility Outcome: Adequate for Discharge Goal: Will not experience complications related to urinary retention Outcome: Adequate for Discharge   Problem: Pain Managment: Goal: General experience of comfort will improve and/or be controlled Outcome: Adequate for Discharge   Problem: Safety: Goal: Ability to remain free from injury will improve Outcome: Adequate for Discharge   Problem: Skin Integrity: Goal: Risk for  impaired skin integrity will decrease Outcome: Adequate for Discharge

## 2023-12-10 ENCOUNTER — Telehealth: Payer: Self-pay

## 2023-12-10 NOTE — Transitions of Care (Post Inpatient/ED Visit) (Signed)
   12/10/2023  Name: [name] [name] MRN: [id number] DOB: 19-Nov-1990  Today's TOC FU Call Status: Today's TOC FU Call Status:: Unsuccessful Call (1st Attempt) Unsuccessful Call (1st Attempt) Date: 12/10/23  Attempted to reach the patient regarding the most recent Inpatient/ED visit.  Follow Up Plan: Additional outreach attempts will be made to reach the patient to complete the Transitions of Care (Post Inpatient/ED visit) call.   Signature  Nikol Lemar, LPN [facility] AWV Team Direct Dial: [phone]

## 2023-12-24 ENCOUNTER — Other Ambulatory Visit (INDEPENDENT_AMBULATORY_CARE_PROVIDER_SITE_OTHER)

## 2023-12-24 ENCOUNTER — Encounter: Payer: Self-pay | Admitting: Physician Assistant

## 2023-12-24 ENCOUNTER — Ambulatory Visit: Admitting: Physician Assistant

## 2023-12-24 ENCOUNTER — Ambulatory Visit: Payer: Self-pay | Admitting: Physician Assistant

## 2023-12-24 VITALS — BP 118/60 | HR 71 | Ht 71.0 in | Wt 157.0 lb

## 2023-12-24 DIAGNOSIS — R143 Flatulence: Secondary | ICD-10-CM

## 2023-12-24 DIAGNOSIS — K863 Pseudocyst of pancreas: Secondary | ICD-10-CM

## 2023-12-24 DIAGNOSIS — K861 Other chronic pancreatitis: Secondary | ICD-10-CM

## 2023-12-24 DIAGNOSIS — Z952 Presence of prosthetic heart valve: Secondary | ICD-10-CM | POA: Diagnosis not present

## 2023-12-24 DIAGNOSIS — R1013 Epigastric pain: Secondary | ICD-10-CM

## 2023-12-24 DIAGNOSIS — F1011 Alcohol abuse, in remission: Secondary | ICD-10-CM

## 2023-12-24 DIAGNOSIS — Z7901 Long term (current) use of anticoagulants: Secondary | ICD-10-CM

## 2023-12-24 LAB — COMPREHENSIVE METABOLIC PANEL WITH GFR
ALT: 40 U/L (ref 0–53)
AST: 46 U/L — ABNORMAL HIGH (ref 0–37)
Albumin: 4.3 g/dL (ref 3.5–5.2)
Alkaline Phosphatase: 88 U/L (ref 39–117)
BUN: 8 mg/dL (ref 6–23)
CO2: 34 meq/L — ABNORMAL HIGH (ref 19–32)
Calcium: 8.9 mg/dL (ref 8.4–10.5)
Chloride: 103 meq/L (ref 96–112)
Creatinine, Ser: 0.69 mg/dL (ref 0.40–1.50)
GFR: 122.09 mL/min (ref >60.00)
Glucose, Bld: 109 mg/dL — ABNORMAL HIGH (ref 70–99)
Potassium: 3.9 meq/L (ref 3.5–5.1)
Sodium: 141 meq/L (ref 135–145)
Total Bilirubin: 0.3 mg/dL (ref 0.2–1.2)
Total Protein: 7.1 g/dL (ref 6.0–8.3)

## 2023-12-24 LAB — CBC WITH DIFFERENTIAL/PLATELET
Basophils Absolute: 0 K/uL (ref 0.0–0.1)
Basophils Relative: 0.5 % (ref 0.0–3.0)
Eosinophils Absolute: 0.3 K/uL (ref 0.0–0.7)
Eosinophils Relative: 6.2 % — ABNORMAL HIGH (ref 0.0–5.0)
HCT: 35.3 % — ABNORMAL LOW (ref 39.0–52.0)
Hemoglobin: 11.8 g/dL — ABNORMAL LOW (ref 13.0–17.0)
Lymphocytes Relative: 51.6 % — ABNORMAL HIGH (ref 12.0–46.0)
Lymphs Abs: 2.4 K/uL (ref 0.7–4.0)
MCHC: 33.5 g/dL (ref 30.0–36.0)
MCV: 86.5 fl (ref 78.0–100.0)
Monocytes Absolute: 0.4 K/uL (ref 0.1–1.0)
Monocytes Relative: 9 % (ref 3.0–12.0)
Neutro Abs: 1.5 K/uL (ref 1.4–7.7)
Neutrophils Relative %: 32.7 % — ABNORMAL LOW (ref 43.0–77.0)
Platelets: 242 K/uL (ref 150.0–400.0)
RBC: 4.08 Mil/uL — ABNORMAL LOW (ref 4.22–5.81)
RDW: 14.8 % (ref 11.5–15.5)
WBC: 4.7 K/uL (ref 4.0–10.5)

## 2023-12-24 LAB — LIPASE: Lipase: 72 U/L — ABNORMAL HIGH (ref 11.0–59.0)

## 2023-12-24 NOTE — Patient Instructions (Signed)
 Continue Creon .   Try Gas-X with meals.   Your provider has requested that you go to the basement level for lab work before leaving today. Press B on the elevator. The lab is located at the first door on the left as you exit the elevator.  You have been given a testing kit to check for small intestine bacterial overgrowth (SIBO) which is completed by a company named [facility]. Make sure to return your test in the mail using the return mailing label given to you along with the kit. The test order, your demographic and insurance information have all already been sent to the company. [facility] will collect an upfront charge of $109.00 for commercial insurance plans and $229.00 if you are paying cash. The potential remaining total after claim submission and review is $120.00. Make sure to discuss with [facility] PRIOR to having the test to see if they have gotten information from your insurance company as to how much your testing will cost out of pocket, if any. Please contact [facility] at phone number [phone] to get instructions regarding how to perform the test as our office is unable to give specific testing instructions.

## 2023-12-24 NOTE — Progress Notes (Signed)
 Chief Complaint: Follow-up pancreatitis  HPI:    [name] is a 33 year old Caucasian male, known to Dr. 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 Wilhelmenia, who presents to clinic today for follow-up of pancreatitis.    08/01/2023 EUS/EGD with Dr. Wilhelmenia with 2 cm hiatal hernia, gastric diverticulum in the fundus, erythematous mucosa in the stomach, nonbleeding duodenal diverticulum in the area of the papula.  EUS with a cystic lesion in the pancreatic head/genu of the pancreas, cytology results pending.  Suggestive of a pseudocyst.  Consistent changes with moderate to severe chronic pancreatitis with pancreatic duct dilation.  5 mm pancreatic ductal stone in the area where PD dilation occurred.  No sign of significant pathology in the CBD.  Small amount of sludge in the gallbladder.  1 enlarged lymph node in the porta hepatis region.  At the time recommended pancreatic fecal elastase.  At that time discussed possible role of ERCP for attempt at stenting and extraction/breakdown of the PD stone in the head.  At the time CA 19-9 was recommended.  If normal then repeat MRI/MRCP in 6 to 12 months.  If elevated discussed at multidisciplinary clinic.    08/26/2023 CA 19-9 normal at 6.  At that time repeat MRI/MRCP was recommended in 6 to 12 months.    12/07/2023 patient seen in the hospital by our service for consult for acute on chronic pancreatitis.  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  CT scan findings of antral wall thickening/new complex fluid collection along the inferior wall of the gastric antrum measuring 5.0 x 3.7 cm.  Additionally mild wall thickening with surrounding soft tissue stranding involving the transverse colon which was thought possibly reactive.  At  the time patient noted a history of alcohol abuse but none in the last 8 months.  Lipase noted to be 136.  At the time patient placed on a clear liquid diet.  We signed off on the patient on 12/09/2023 and recommended repeat imaging of his pancreas in 6 to 8 weeks.    Today, patient presents to clinic and discusses his recent hospitalization as above.  He remains in some abdominal discomfort which seems to be his usual state these days, about once every week or so he will have a day of somewhat severe epigastric discomfort, he typically can get through this with Dicyclomine  20 mg 4 times daily and his Creon .  Tells me though he then remains sore the next day.  Concerning to him is that he seems to have a steady weight loss.  He weighed about 190 pounds last year around this timeframe and has continually lost regardless of using the Boost shakes consistently to supplement his diet over the past week or so.  He no longer drinks alcohol and has not for over a year per him.  He really tries to eat the right things and avoid things that may cause an exacerbation but he is just not sure what that is.  Also discusses some pale yellow stools ever since his recent hospitalization which have not gone back to normal color.  Also has a lot of gas, has tried Gas-X as needed which does not really help.  Tells me this has been ongoing over the past couple of weeks.  Typically flatulence.    Remains on  Truvada for HIV prophylaxis.    Denies fever, chills, weight loss or blood in his stool.    Past Medical History:  Diagnosis Date   Asthma    Headache    migraines   Heart murmur    Hypokalemia 06/08/2020   Leukocytosis 01/23/2022   Mild aortic stenosis    Pancreatitis    Pneumonia    as a child x several   Substance abuse [facility])     Past Surgical History:  Procedure Laterality Date   AORTIC VALVE REPLACEMENT N/A 09/27/2021   Procedure: AORTIC VALVE REPLACEMENT (AVR) USING ON-X AORTIC VALVE SIZE ;  Surgeon:  Kerrin Elspeth BROCKS, MD;  Location: [facility] OR;  Service: Open Heart Surgery;  Laterality: N/A;   BIOPSY  08/01/2023   Procedure: BIOPSY;  Surgeon: [name] [name] [name] [name]., MD;  Location: [facility];  Service: Gastroenterology;;   ESOPHAGOGASTRODUODENOSCOPY N/A 08/01/2023   Procedure: ESOPHAGOGASTRODUODENOSCOPY (EGD);  Surgeon: [name] [name] [name] [name]., MD;  Location: [facility];  Service: Gastroenterology;  Laterality: N/A;   EUS N/A 08/01/2023   Procedure: UPPER ENDOSCOPIC ULTRASOUND (EUS) RADIAL;  Surgeon: [name] [name] [name] [name]., MD;  Location: [facility];  Service: Gastroenterology;  Laterality: N/A;   FINE NEEDLE ASPIRATION N/A 08/01/2023   Procedure: FINE NEEDLE ASPIRATION (FNA) LINEAR;  Surgeon: [name] [name] [name] [name]., MD;  Location: [facility];  Service: Gastroenterology;  Laterality: N/A;   INGUINAL HERNIA REPAIR Right 01/05/2022   Procedure: OPEN REPAIR INCARCERATED RIGHT INGUINAL HERNIA WITH MESH;  Surgeon: Tanda Locus, MD;  Location: [facility] ORS;  Service: General;  Laterality: Right;  90 MIN - ROOM 2   INGUINAL HERNIA REPAIR Left 12/20/2022   Procedure: OPEN REPAIR LEFT INGUINAL HERNIA WITH MESH;  Surgeon: Tanda Locus, MD;  Location: [facility];  Service: General;  Laterality: Left;   REPLACEMENT ASCENDING AORTA N/A 09/27/2021   Procedure: REPLACEMENT ASCENDING AND INNOMINATE ANEURYSM USING HEMASHIELD PLATINUM [id number];  Surgeon: Kerrin Elspeth BROCKS, MD;  Location: [facility] OR;  Service: Open Heart Surgery;  Laterality: N/A;   RIGHT HEART CATH AND CORONARY ANGIOGRAPHY N/A 09/07/2021   Procedure: RIGHT HEART CATH AND CORONARY ANGIOGRAPHY;  Surgeon: Wendel Lurena POUR, MD;  Location: [facility];  Service: Cardiovascular;  Laterality: N/A;   TEE WITHOUT CARDIOVERSION N/A 09/27/2021   Procedure: TRANSESOPHAGEAL ECHOCARDIOGRAM (TEE);  Surgeon: Kerrin Elspeth BROCKS, MD;  Location: [facility] OR;  Service: Open Heart Surgery;  Laterality: N/A;    Current Outpatient Medications  Medication Sig  Dispense Refill   acetaminophen  (TYLENOL ) 500 MG tablet Take 1,000 mg by mouth every 6 (six) hours as needed for moderate pain (pain score 4-6).     aspirin  EC 81 MG EC tablet Take 1 tablet (81 mg total) by mouth daily. Swallow whole. 30 tablet 11   dicyclomine  (BENTYL ) 10 MG capsule TAKE 2 CAPSULES(20 MG) BY MOUTH FOUR TIMES DAILY BEFORE MEALS AND AT BEDTIME (Patient taking differently: Take 20 mg by mouth 3 (three) times daily.) 360 capsule 1   emtricitabine -tenofovir  (TRUVADA) 200-300 MG tablet Take 1 tablet by mouth daily.     lipase/protease/amylase (CREON ) 36000 UNITS CPEP capsule Take 2 capsules (72,000 Units total) by mouth 3 (three) times daily before meals.     loratadine  (CLARITIN ) 10 MG tablet Take 10 mg by mouth daily.     methocarbamol  (ROBAXIN ) 500 MG tablet Take 1-2 tablets (500-1,000 mg total) by mouth 4 (four) times daily. 30 tablet 0   metoprolol  succinate (TOPROL  XL) 25 MG 24 hr tablet Take 0.5 tablets (12.5 mg total) by  mouth at bedtime. 45 tablet 2   ondansetron  (ZOFRAN ) 4 MG tablet Take 1 tablet (4 mg total) by mouth every 8 (eight) hours as needed for nausea or vomiting. 20 tablet 0   warfarin (COUMADIN ) 3 MG tablet TAKE 1 1/2 TABLETS TO 2 TABLETS BY MOUTH DAILY OR AS DIRECTED BY COAGULATION CLINIC (Patient taking differently: Take 4.5-6 mg by mouth See admin instructions. Take 6mg  (2 tablets) by mouth every Monday and Wednesday, and take 4.5mg  (1 and 1/2 tablet) every other day of the week.) 140 tablet 0   No current facility-administered medications for this visit.    Allergies as of 12/24/2023 - Review Complete 12/24/2023  Allergen Reaction Noted   Amoxicillin Hives 11/01/2016    Family History  Problem Relation Age of Onset   Pancreatitis Maternal Uncle    Colon cancer Neg Hx    Esophageal cancer Neg Hx     Social History   Socioeconomic History   Marital status: Single    Spouse name: Not on file   Number of children: Not on file   Years of education: Not  on file   Highest education level: Bachelor's degree (e.g., BA, AB, BS)  Occupational History   Occupation: Bartender    Comment: [employer]  Tobacco Use   Smoking status: Never   Smokeless tobacco: Never  Vaping Use   Vaping status: Never Used  Substance and Sexual Activity   Alcohol use: Not Currently    Comment: none x 1 month as od 04/11/2023   Drug use: Not Currently    Types: Other-see comments    Comment: Edibles occasionally marijuana   Sexual activity: Yes  Other Topics Concern   Not on file  Social History Narrative   Not on file   Social Drivers of Health   Financial Resource Strain: Low Risk  (04/25/2023)   Overall Financial Resource Strain (CARDIA)    Difficulty of Paying Living Expenses: Not very hard  Food Insecurity: No Food Insecurity (12/07/2023)   Hunger Vital Sign    Worried About Running Out of Food in the Last Year: Never true    Ran Out of Food in the Last Year: Never true  Transportation Needs: No Transportation Needs (12/07/2023)   PRAPARE - Administrator, Civil Service (Medical): No    Lack of Transportation (Non-Medical): No  Physical Activity: Sufficiently Active (04/25/2023)   Exercise Vital Sign    Days of Exercise per Week: 5 days    Minutes of Exercise per Session: 150+ min  Stress: No Stress Concern Present (04/25/2023)   [employer] of Occupational Health - Occupational Stress Questionnaire    Feeling of Stress : Only a little  Social Connections: Unknown (04/25/2023)   Social Connection and Isolation Panel    Frequency of Communication with Friends and Family: Three times a week    Frequency of Social Gatherings with Friends and Family: Once a week    Attends Religious Services: Patient declined    Database administrator or Organizations: No    Attends Engineer, structural: Not on file    Marital Status: Never married  Intimate Partner Violence: Not At Risk (12/07/2023)   Humiliation, Afraid,  Rape, and Kick questionnaire    Fear of Current or Ex-Partner: No    Emotionally Abused: No    Physically Abused: No    Sexually Abused: No    Review of Systems:    Constitutional: No weight loss, fever or chills  Skin: No rash Cardiovascular: No chest pain Respiratory: No SOB Gastrointestinal: See HPI and otherwise negative Genitourinary: No dysuria  Neurological: No headache, dizziness or syncope Musculoskeletal: No new muscle or joint pain Hematologic: No bleeding  Psychiatric: No history of depression or anxiety   Physical Exam:  Vital signs: BP 118/60   Pulse 71   Ht 5' 11 (1.803 m)   Wt 157 lb (71.2 kg)   BMI 21.90 kg/m    Constitutional:   Pleasant Caucasian male appears to be in NAD, Well developed, Well nourished, alert and cooperative Head:  Normocephalic and atraumatic. Eyes:   PEERL, EOMI. No icterus. Conjunctiva pink. Ears:  Normal auditory acuity. Neck:  Supple Throat: Oral cavity and pharynx without inflammation, swelling or lesion.  Respiratory: Respirations even and unlabored. Lungs clear to auscultation bilaterally.   No wheezes, crackles, or rhonchi.  Cardiovascular: Normal S1, S2. No MRG. Regular rate and rhythm. No peripheral edema, cyanosis or pallor.  Gastrointestinal:  Soft, nondistended, moderate epigastric TTP, no rebound or guarding. Normal bowel sounds. No appreciable masses or hepatomegaly. Rectal:  Not performed.  Msk:  Symmetrical without gross deformities. Without edema, no deformity or joint abnormality.  Neurologic:  Alert and  oriented x4;  grossly normal neurologically.  Skin:   Dry and intact without significant lesions or rashes. Psychiatric: Demonstrates good judgement and reason without abnormal affect or behaviors.  RELEVANT LABS AND IMAGING: CBC    Component Value Date/Time   WBC 7.7 12/09/2023 0643   RBC 4.13 (L) 12/09/2023 0643   HGB 12.1 (L) 12/09/2023 0643   HGB 14.6 08/30/2021 1430   HCT 36.4 (L) 12/09/2023 0643   HCT  41.3 08/30/2021 1430   PLT 254 12/09/2023 0643   PLT 277 08/30/2021 1430   MCV 88.1 12/09/2023 0643   MCV 90 08/30/2021 1430   MCH 29.3 12/09/2023 0643   MCHC 33.2 12/09/2023 0643   RDW 13.3 12/09/2023 0643   RDW 12.8 08/30/2021 1430   LYMPHSABS 1.6 12/09/2023 0643   MONOABS 0.7 12/09/2023 0643   EOSABS 0.2 12/09/2023 0643   BASOSABS 0.0 12/09/2023 0643    CMP     Component Value Date/Time   NA 137 12/09/2023 0643   NA 142 08/30/2021 1430   K 3.8 12/09/2023 0643   CL 101 12/09/2023 0643   CO2 29 12/09/2023 0643   GLUCOSE 107 (H) 12/09/2023 0643   BUN <5 (L) 12/09/2023 0643   BUN 13 08/30/2021 1430   CREATININE 0.62 12/09/2023 0643   CALCIUM  8.2 (L) 12/09/2023 0643   PROT 5.5 (L) 12/09/2023 0643   ALBUMIN  3.0 (L) 12/09/2023 0643   AST 13 (L) 12/09/2023 0643   ALT 13 12/09/2023 0643   ALKPHOS 50 12/09/2023 0643   BILITOT 0.6 12/09/2023 0643   GFRNONAA >60 12/09/2023 9356    Assessment: 1.  Chronic pancreatitis with pseudocyst: Pancreatic duct stones noted on EUS/ERCP in February, some discussion by Dr. Wilhelmenia about possible stent or stone extraction if patient continued with symptoms/episodes of pancreatitis, also pancreatic pseudocyst which could be contributing, patient with recent exacerbation that landed him in the hospital about a month ago requiring IV fluids and pain medication, since then has had about a once a week episode of pain which he is able to control at home, minimal diet and continued weight loss likely due to the symptoms 2.  History of alcohol abuse: In remission-no alcohol in the past year per patient 3.  AVR on Warfarin  Plan: 1.  Continue Creon   2 with a meal and 1 with a snack, this does seem to help with his epigastric pain 2.  Would advise that he eat smaller more frequent meals throughout the day instead of 1 big meal 3.  Continue to abstain from alcohol 4.  Will let Dr. Wilhelmenia review.  It does seem like the amount of pain he is in is  hindering his lifestyle.  Discussed that next step may be an ERCP with stone extraction and/or further evaluation of cyst for drainage. 5.  Patient is on Truvada this has not been mentioned much in his previous notes, but wonder if it may be contributing to his chronic pancreatitis. 6.  Labs ordered today including CBC, CMP and lipase.  At the time of my note they have returned.  Lipase remains elevated at 72, though compared to where he was 2 weeks ago in the hospital this is come down from 136.  CMP with only minimal elevation in AST at 46, normal bilirubin.  Less likely duct obstruction.  CBC with stable hemoglobin. 7.  Recommend the patient trial Gas-X with meals to see if this helps with his gas.  Also ordered SIBO testing via breath test today. 8.  Per original recommendations repeat imaging with MRI/MRCP would be recommended in a month from now.  Again will confirm plan with Dr. Wilhelmenia given ongoing levels of pain per patient. 9.  Patient to follow in clinic per recommendations after review by Dr. Wilhelmenia.  [name] Failing, [name] [facility] 12/24/2023, 1:30 PM  Cc: Lucius Krabbe, NP

## 2023-12-24 NOTE — Progress Notes (Signed)
 Lipase is still minimally elevated.  Discussing further with Dr. Mansouraty/Dr. Albertus for further intervention given chronic pancreatitis.  Delon Failing, PA-see

## 2023-12-27 ENCOUNTER — Encounter: Payer: Self-pay | Admitting: Family

## 2023-12-31 ENCOUNTER — Telehealth: Payer: Self-pay

## 2023-12-31 DIAGNOSIS — K861 Other chronic pancreatitis: Secondary | ICD-10-CM

## 2023-12-31 NOTE — Telephone Encounter (Signed)
-----   Message from [place] D. Zehr sent at 12/31/2023 11:52 AM EDT ----- Regarding: FW: Pancreatitis Can we please order these labs and let the patient know to have them drawn?  CFTR/Spink 1/PRSS, should be obtained (per Dr. Wilhelmenia). ----- Message ----- From: Albertus Gordy HERO, MD Sent: 12/30/2023  12:33 PM EDT To: Delon Hendricks Failing, PA; Aloha Mansourat# Subject: RE: Pancreatitis                               GM I think perhaps best for you to see him in clinic to discuss ERP and possible nerve block Delon, can you please obtain the labs noted by GM?   Thanks  Sound okay? JMP ----- Message ----- From: Wilhelmenia Aloha Raddle., MD Sent: 12/27/2023   5:47 AM EDT To: Gordy HERO Albertus, MD; Delon Hendricks Failing, PA Subject: RE: Pancreatitis                               Reasonable things to consider. I would recommend that the patient complete genetic testing since he continues to have acute on chronic episodes pancreatitis. CFTR/Spink 1/PRSS, should be obtained. I am willing to discuss with him consideration of celiac block to see if that may help with pain/discomfort. I will need to talk with him specifically if we were going to consider pancreatic ERCP but it may be something to consider if he is up for that. Let me know how you all would like to proceed. Thanks. GM ----- Message ----- From: Failing Delon Hendricks, PA Sent: 12/24/2023   5:36 PM EDT To: Gordy HERO Albertus, MD; Aloha Wilhelmenia Raddle., MD Subject: Pancreatitis                                   Dr. Wilhelmenia, would appreciate your review of recent imaging for this patient.  You recently did an EUS/EGD and suggested possible ERCP.  Patient does still remain in pain weekly, but not daily.  He was in the hospital recently with a flare.  Could Truvada be contributing as well?  Would appreciate your expertise.  Dr. Albertus, I believe this patient actually belongs with you, so we will send note to you for review as  well.  Thanks, JL L

## 2023-12-31 NOTE — Telephone Encounter (Signed)
 Labs have been entered  The pt has been advised via My Chart- he does view messagaes

## 2024-01-01 ENCOUNTER — Telehealth: Payer: Self-pay

## 2024-01-01 NOTE — Telephone Encounter (Signed)
-----   Message from Nurse Naomie RAMAN sent at 01/01/2024  3:29 PM EDT ----- Regarding: FW: Pancreatitis [name] [name] can you please help with this? Patient has appt with GM but not until end of September ----- Message ----- From: Wilhelmenia Aloha Raddle., MD Sent: 01/01/2024   3:24 PM EDT To: Gordy CHRISTELLA Starch, MD; Delon Hendricks Failing, PA;# Subject: RE: Pancreatitis                               JMP, Reasonable. Pod A nurses, please work with my nurses and schedule a clinic visit as able with me (okay to use a 3:50 PM slot or held slot). This will be to discuss celiac block +/- ERCP pancreas work. Thanks. GM ----- Message ----- From: Starch Gordy CHRISTELLA, MD Sent: 12/30/2023  12:33 PM EDT To: Delon Hendricks Failing, PA; Aloha Mansourat# Subject: RE: Pancreatitis                               GM I think perhaps best for you to see him in clinic to discuss ERP and possible nerve block Delon, can you please obtain the labs noted by GM?   Thanks  Sound okay? JMP ----- Message ----- From: Wilhelmenia Aloha Raddle., MD Sent: 12/27/2023   5:47 AM EDT To: Gordy CHRISTELLA Starch, MD; Delon Hendricks Failing, PA Subject: RE: Pancreatitis                               Reasonable things to consider. I would recommend that the patient complete genetic testing since he continues to have acute on chronic episodes pancreatitis. CFTR/Spink 1/PRSS, should be obtained. I am willing to discuss with him consideration of celiac block to see if that may help with pain/discomfort. I will need to talk with him specifically if we were going to consider pancreatic ERCP but it may be something to consider if he is up for that. Let me know how you all would like to proceed. Thanks. GM ----- Message ----- From: Failing Delon Hendricks, PA Sent: 12/24/2023   5:36 PM EDT To: Gordy CHRISTELLA Starch, MD; Aloha Wilhelmenia Raddle., MD Subject: Pancreatitis                                   Dr. Wilhelmenia, would appreciate your review of recent imaging  for this patient.  You recently did an EUS/EGD and suggested possible ERCP.  Patient does still remain in pain weekly, but not daily.  He was in the hospital recently with a flare.  Could Truvada be contributing as well?  Would appreciate your expertise.  Dr. Starch, I believe this patient actually belongs with you, so we will send note to you for review as well.  Thanks, JL L

## 2024-01-01 NOTE — Telephone Encounter (Signed)
 Appt made for 01/15/24 at 130 pm with Camie- pt advised

## 2024-01-02 ENCOUNTER — Other Ambulatory Visit (INDEPENDENT_AMBULATORY_CARE_PROVIDER_SITE_OTHER)

## 2024-01-02 ENCOUNTER — Telehealth: Payer: Self-pay

## 2024-01-02 DIAGNOSIS — K861 Other chronic pancreatitis: Secondary | ICD-10-CM

## 2024-01-02 NOTE — Telephone Encounter (Signed)
 Appt made with GM for 02/12/24 at 10:50 am. Pt advised

## 2024-01-02 NOTE — Telephone Encounter (Signed)
-----   Message from [place] D. Zehr sent at 01/02/2024 11:53 AM EDT ----- Regarding: FW: Pancreatitis She was given an appointment with Lauraine.  Lauraine should not be seeing this patient.  Dr. Wilhelmenia specifically needs to see this patient as below.  Can you please reschedule?  Thank you,  Jess ----- Message ----- From: Wilhelmenia Aloha Raddle., MD Sent: 01/01/2024   3:24 PM EDT To: Gordy CHRISTELLA Starch, MD; Delon Hendricks Failing, PA;# Subject: RE: Pancreatitis                               JMP, Reasonable. Pod A nurses, please work with my nurses and schedule a clinic visit as able with me (okay to use a 3:50 PM slot or held slot). This will be to discuss celiac block +/- ERCP pancreas work. Thanks. GM ----- Message ----- From: Starch Gordy CHRISTELLA, MD Sent: 12/30/2023  12:33 PM EDT To: Delon Hendricks Failing, PA; Aloha Mansourat# Subject: RE: Pancreatitis                               GM I think perhaps best for you to see him in clinic to discuss ERP and possible nerve block Delon, can you please obtain the labs noted by GM?   Thanks  Sound okay? JMP ----- Message ----- From: Wilhelmenia Aloha Raddle., MD Sent: 12/27/2023   5:47 AM EDT To: Gordy CHRISTELLA Starch, MD; Delon Hendricks Failing, PA Subject: RE: Pancreatitis                               Reasonable things to consider. I would recommend that the patient complete genetic testing since he continues to have acute on chronic episodes pancreatitis. CFTR/Spink 1/PRSS, should be obtained. I am willing to discuss with him consideration of celiac block to see if that may help with pain/discomfort. I will need to talk with him specifically if we were going to consider pancreatic ERCP but it may be something to consider if he is up for that. Let me know how you all would like to proceed. Thanks. GM ----- Message ----- From: Failing Delon Hendricks, PA Sent: 12/24/2023   5:36 PM EDT To: Gordy CHRISTELLA Starch, MD; Aloha Wilhelmenia Raddle., MD Subject:  Pancreatitis                                   Dr. Wilhelmenia, would appreciate your review of recent imaging for this patient.  You recently did an EUS/EGD and suggested possible ERCP.  Patient does still remain in pain weekly, but not daily.  He was in the hospital recently with a flare.  Could Truvada be contributing as well?  Would appreciate your expertise.  Dr. Starch, I believe this patient actually belongs with you, so we will send note to you for review as well.  Thanks, JL L

## 2024-01-08 ENCOUNTER — Telehealth: Payer: Self-pay | Admitting: Physician Assistant

## 2024-01-08 NOTE — Telephone Encounter (Signed)
 Received call from [facility] regarding patient's Sibo test. Stated there is missing information. Callback number is [phone]. Please advise, Thank you.

## 2024-01-09 NOTE — Telephone Encounter (Signed)
 I left [facility] a message to call me back.

## 2024-01-09 NOTE — Telephone Encounter (Signed)
 They needed the order form to proceed. I completed one and faxed it to them. Got confirmation it went thru.

## 2024-01-11 ENCOUNTER — Other Ambulatory Visit: Payer: Self-pay

## 2024-01-11 ENCOUNTER — Inpatient Hospital Stay (HOSPITAL_COMMUNITY)
Admission: EM | Admit: 2024-01-11 | Discharge: 2024-02-17 | DRG: 438 | Disposition: A | Discharge status: Home Health | Attending: Hospitalist | Admitting: Hospitalist

## 2024-01-11 ENCOUNTER — Emergency Department (HOSPITAL_COMMUNITY)

## 2024-01-11 DIAGNOSIS — K638219 Small intestinal bacterial overgrowth, unspecified: Secondary | ICD-10-CM | POA: Diagnosis present

## 2024-01-11 DIAGNOSIS — E440 Moderate protein-calorie malnutrition: Secondary | ICD-10-CM

## 2024-01-11 DIAGNOSIS — K760 Fatty (change of) liver, not elsewhere classified: Secondary | ICD-10-CM | POA: Diagnosis present

## 2024-01-11 DIAGNOSIS — E43 Unspecified severe protein-calorie malnutrition: Secondary | ICD-10-CM | POA: Diagnosis present

## 2024-01-11 DIAGNOSIS — Z8709 Personal history of other diseases of the respiratory system: Secondary | ICD-10-CM

## 2024-01-11 DIAGNOSIS — E46 Unspecified protein-calorie malnutrition: Secondary | ICD-10-CM | POA: Diagnosis not present

## 2024-01-11 DIAGNOSIS — J45909 Unspecified asthma, uncomplicated: Secondary | ICD-10-CM | POA: Diagnosis present

## 2024-01-11 DIAGNOSIS — Z7982 Long term (current) use of aspirin: Secondary | ICD-10-CM

## 2024-01-11 DIAGNOSIS — K3189 Other diseases of stomach and duodenum: Secondary | ICD-10-CM | POA: Diagnosis present

## 2024-01-11 DIAGNOSIS — R188 Other ascites: Secondary | ICD-10-CM | POA: Diagnosis present

## 2024-01-11 DIAGNOSIS — K311 Adult hypertrophic pyloric stenosis: Secondary | ICD-10-CM | POA: Diagnosis present

## 2024-01-11 DIAGNOSIS — K861 Other chronic pancreatitis: Secondary | ICD-10-CM | POA: Diagnosis present

## 2024-01-11 DIAGNOSIS — K863 Pseudocyst of pancreas: Secondary | ICD-10-CM | POA: Diagnosis present

## 2024-01-11 DIAGNOSIS — K2990 Gastroduodenitis, unspecified, without bleeding: Secondary | ICD-10-CM | POA: Diagnosis present

## 2024-01-11 DIAGNOSIS — K2980 Duodenitis without bleeding: Secondary | ICD-10-CM | POA: Diagnosis present

## 2024-01-11 DIAGNOSIS — R112 Nausea with vomiting, unspecified: Secondary | ICD-10-CM | POA: Diagnosis not present

## 2024-01-11 DIAGNOSIS — F1011 Alcohol abuse, in remission: Secondary | ICD-10-CM | POA: Diagnosis present

## 2024-01-11 DIAGNOSIS — Z4659 Encounter for fitting and adjustment of other gastrointestinal appliance and device: Secondary | ICD-10-CM

## 2024-01-11 DIAGNOSIS — Z7901 Long term (current) use of anticoagulants: Secondary | ICD-10-CM

## 2024-01-11 DIAGNOSIS — Z952 Presence of prosthetic heart valve: Secondary | ICD-10-CM | POA: Diagnosis not present

## 2024-01-11 DIAGNOSIS — R001 Bradycardia, unspecified: Secondary | ICD-10-CM | POA: Diagnosis present

## 2024-01-11 DIAGNOSIS — R54 Age-related physical debility: Secondary | ICD-10-CM | POA: Diagnosis present

## 2024-01-11 DIAGNOSIS — N179 Acute kidney failure, unspecified: Secondary | ICD-10-CM | POA: Diagnosis present

## 2024-01-11 DIAGNOSIS — Z880 Allergy status to penicillin: Secondary | ICD-10-CM | POA: Diagnosis not present

## 2024-01-11 DIAGNOSIS — K8681 Exocrine pancreatic insufficiency: Secondary | ICD-10-CM | POA: Diagnosis not present

## 2024-01-11 DIAGNOSIS — J9811 Atelectasis: Secondary | ICD-10-CM | POA: Diagnosis present

## 2024-01-11 DIAGNOSIS — R791 Abnormal coagulation profile: Secondary | ICD-10-CM | POA: Diagnosis not present

## 2024-01-11 DIAGNOSIS — I10 Essential (primary) hypertension: Secondary | ICD-10-CM | POA: Diagnosis present

## 2024-01-11 DIAGNOSIS — R110 Nausea: Secondary | ICD-10-CM | POA: Diagnosis not present

## 2024-01-11 DIAGNOSIS — Z6821 Body mass index (BMI) 21.0-21.9, adult: Secondary | ICD-10-CM

## 2024-01-11 DIAGNOSIS — Z79899 Other long term (current) drug therapy: Secondary | ICD-10-CM

## 2024-01-11 DIAGNOSIS — K30 Functional dyspepsia: Secondary | ICD-10-CM | POA: Diagnosis present

## 2024-01-11 DIAGNOSIS — D638 Anemia in other chronic diseases classified elsewhere: Secondary | ICD-10-CM | POA: Diagnosis present

## 2024-01-11 DIAGNOSIS — K8689 Other specified diseases of pancreas: Secondary | ICD-10-CM | POA: Diagnosis not present

## 2024-01-11 DIAGNOSIS — K862 Cyst of pancreas: Secondary | ICD-10-CM | POA: Diagnosis present

## 2024-01-11 DIAGNOSIS — R1013 Epigastric pain: Secondary | ICD-10-CM | POA: Diagnosis not present

## 2024-01-11 DIAGNOSIS — Z888 Allergy status to other drugs, medicaments and biological substances status: Secondary | ICD-10-CM | POA: Diagnosis not present

## 2024-01-11 DIAGNOSIS — K859 Acute pancreatitis without necrosis or infection, unspecified: Secondary | ICD-10-CM | POA: Diagnosis present

## 2024-01-11 DIAGNOSIS — R1110 Vomiting, unspecified: Secondary | ICD-10-CM | POA: Diagnosis not present

## 2024-01-11 LAB — CBC
HCT: 40.1 % (ref 39.0–52.0)
Hemoglobin: 12.7 g/dL — ABNORMAL LOW (ref 13.0–17.0)
MCH: 28.7 pg (ref 26.0–34.0)
MCHC: 31.7 g/dL (ref 30.0–36.0)
MCV: 90.5 fL (ref 80.0–100.0)
Platelets: 302 K/uL (ref 150–400)
RBC: 4.43 MIL/uL (ref 4.22–5.81)
RDW: 14.1 % (ref 11.5–15.5)
WBC: 12.3 K/uL — ABNORMAL HIGH (ref 4.0–10.5)
nRBC: 0 % (ref 0.0–0.2)

## 2024-01-11 LAB — COMPREHENSIVE METABOLIC PANEL WITH GFR
ALT: 25 U/L (ref 0–44)
AST: 27 U/L (ref 15–41)
Albumin: 4.3 g/dL (ref 3.5–5.0)
Alkaline Phosphatase: 73 U/L (ref 38–126)
Anion gap: 12 (ref 5–15)
BUN: 9 mg/dL (ref 6–20)
CO2: 25 mmol/L (ref 22–32)
Calcium: 9.3 mg/dL (ref 8.9–10.3)
Chloride: 101 mmol/L (ref 98–111)
Creatinine, Ser: 0.77 mg/dL (ref 0.61–1.24)
GFR, Estimated: >60 mL/min (ref >60)
Glucose, Bld: 132 mg/dL — ABNORMAL HIGH (ref 70–99)
Potassium: 4 mmol/L (ref 3.5–5.1)
Sodium: 138 mmol/L (ref 135–145)
Total Bilirubin: <0.2 mg/dL (ref 0.0–1.2)
Total Protein: 7.3 g/dL (ref 6.5–8.1)

## 2024-01-11 LAB — URINALYSIS, ROUTINE W REFLEX MICROSCOPIC
Bilirubin Urine: NEGATIVE
Glucose, UA: NEGATIVE mg/dL
Hgb urine dipstick: NEGATIVE
Ketones, ur: NEGATIVE mg/dL
Leukocytes,Ua: NEGATIVE
Nitrite: NEGATIVE
Protein, ur: NEGATIVE mg/dL
Specific Gravity, Urine: 1.012 (ref 1.005–1.030)
pH: 8 (ref 5.0–8.0)

## 2024-01-11 LAB — C-REACTIVE PROTEIN: CRP: <0.5 mg/dL (ref <1.0)

## 2024-01-11 LAB — LIPASE, BLOOD: Lipase: 338 U/L — ABNORMAL HIGH (ref 11–51)

## 2024-01-11 LAB — PROTIME-INR
INR: 1 (ref 0.8–1.2)
Prothrombin Time: 13.7 s (ref 11.4–15.2)

## 2024-01-11 MED ORDER — WARFARIN - PHARMACIST DOSING INPATIENT: Freq: Every day | Status: DC

## 2024-01-11 MED ORDER — SODIUM CHLORIDE 0.9% FLUSH
3.0000 mL | Freq: Two times a day (BID) | INTRAVENOUS | Status: DC
  Administered 2024-01-11 – 2024-02-17 (×79): 3 mL via INTRAVENOUS

## 2024-01-11 MED ORDER — PANCRELIPASE (LIP-PROT-AMYL) 36000-114000 UNITS PO CPEP
72000.0000 [IU] | ORAL_CAPSULE | Freq: Three times a day (TID) | ORAL | Status: DC
  Administered 2024-01-11 – 2024-01-26 (×45): 72000 [IU] via ORAL
  Filled 2024-01-11 (×47): qty 2

## 2024-01-11 MED ORDER — LACTATED RINGERS IV SOLN: INTRAVENOUS | Status: AC

## 2024-01-11 MED ORDER — ONDANSETRON HCL 4 MG/2ML IJ SOLN
4.0000 mg | Freq: Four times a day (QID) | INTRAMUSCULAR | Status: DC | PRN
  Administered 2024-01-12 – 2024-02-17 (×59): 4 mg via INTRAVENOUS
  Filled 2024-01-11 (×55): qty 2

## 2024-01-11 MED ORDER — IOHEXOL 350 MG/ML SOLN
75.0000 mL | Freq: Once | INTRAVENOUS | Status: AC | PRN
  Administered 2024-01-11: 75 mL via INTRAVENOUS

## 2024-01-11 MED ORDER — ONDANSETRON HCL 4 MG PO TABS
4.0000 mg | ORAL_TABLET | Freq: Four times a day (QID) | ORAL | Status: DC | PRN
  Administered 2024-01-11 – 2024-02-08 (×5): 4 mg via ORAL
  Filled 2024-01-11 (×6): qty 1

## 2024-01-11 MED ORDER — LORATADINE 10 MG PO TABS
10.0000 mg | ORAL_TABLET | Freq: Every day | ORAL | Status: DC
  Administered 2024-01-11 – 2024-01-28 (×21): 10 mg via ORAL
  Filled 2024-01-11 (×18): qty 1

## 2024-01-11 MED ORDER — LACTATED RINGERS IV SOLN: INTRAVENOUS | Status: DC

## 2024-01-11 MED ORDER — OXYCODONE-ACETAMINOPHEN 5-325 MG PO TABS
1.0000 | ORAL_TABLET | Freq: Four times a day (QID) | ORAL | Status: DC | PRN
  Administered 2024-01-11 – 2024-01-12 (×2): 1 via ORAL
  Filled 2024-01-11 (×2): qty 1

## 2024-01-11 MED ORDER — DICYCLOMINE HCL 10 MG PO CAPS
20.0000 mg | ORAL_CAPSULE | Freq: Three times a day (TID) | ORAL | Status: DC
  Administered 2024-01-11 – 2024-01-22 (×51): 20 mg via ORAL
  Filled 2024-01-11 (×41): qty 2

## 2024-01-11 MED ORDER — METOPROLOL SUCCINATE ER 25 MG PO TB24: 12.5000 mg | ORAL_TABLET | Freq: Every day | ORAL | Status: DC

## 2024-01-11 MED ORDER — HYDROMORPHONE HCL 1 MG/ML IJ SOLN
0.5000 mg | INTRAMUSCULAR | Status: DC | PRN
  Administered 2024-01-11 – 2024-01-12 (×7): 0.5 mg via INTRAVENOUS
  Filled 2024-01-11 (×7): qty 1

## 2024-01-11 MED ORDER — HYDRALAZINE HCL 20 MG/ML IJ SOLN: 10.0000 mg | INTRAMUSCULAR | Status: DC | PRN

## 2024-01-11 MED ORDER — ONDANSETRON HCL 4 MG/2ML IJ SOLN
4.0000 mg | Freq: Once | INTRAMUSCULAR | Status: AC
  Administered 2024-01-11: 4 mg via INTRAVENOUS
  Filled 2024-01-11: qty 2

## 2024-01-11 MED ORDER — METOPROLOL SUCCINATE ER 25 MG PO TB24
12.5000 mg | ORAL_TABLET | Freq: Every day | ORAL | Status: DC
  Administered 2024-01-11 – 2024-01-27 (×20): 12.5 mg via ORAL
  Filled 2024-01-11 (×17): qty 1

## 2024-01-11 MED ORDER — MORPHINE SULFATE (PF) 4 MG/ML IV SOLN
4.0000 mg | Freq: Once | INTRAVENOUS | Status: AC
  Administered 2024-01-11: 4 mg via INTRAVENOUS
  Filled 2024-01-11: qty 1

## 2024-01-11 MED ORDER — LACTATED RINGERS IV BOLUS
1000.0000 mL | Freq: Once | INTRAVENOUS | Status: AC
  Administered 2024-01-11: 1000 mL via INTRAVENOUS

## 2024-01-11 MED ORDER — WARFARIN SODIUM 6 MG PO TABS
6.0000 mg | ORAL_TABLET | Freq: Once | ORAL | Status: AC
  Administered 2024-01-11: 6 mg via ORAL
  Filled 2024-01-11: qty 1

## 2024-01-11 MED ORDER — ALBUTEROL SULFATE (2.5 MG/3ML) 0.083% IN NEBU: 2.5000 mg | INHALATION_SOLUTION | RESPIRATORY_TRACT | Status: DC | PRN

## 2024-01-11 MED ORDER — MORPHINE SULFATE (PF) 2 MG/ML IV SOLN
2.0000 mg | INTRAVENOUS | Status: DC | PRN
  Administered 2024-01-11: 2 mg via INTRAVENOUS
  Filled 2024-01-11: qty 1

## 2024-01-11 NOTE — Consult Note (Signed)
 Consultation  Referring Provider: TR[facility]/ Claudene Primary Care Physician:  Lucius Krabbe, NP Primary Gastroenterologist:  Dr.Pyrtle/ Mansouraty  Reason for Consultation: Recurrent acute on chronic pancreatitis  HPI: [name] [name] is a 33 y.o. male, known to the GI service, initially seen in consultation November 2020 for when he was admitted with recurrent pancreatitis.  He had initial episode of pancreatitis in 2017, was admitted in 2023 due to acute pancreatitis, had a brief admission in October 2024 with acute pancreatitis, discharged and then readmitted due to pain nausea vomiting and at that time CT imaging showed mild to moderate acute pancreatitis, enlarging pancreatic head and partially calcified pseudocyst measuring 3.7 x 2.6 x 3.7 cm. Patient does have history of EtOH abuse, says he has been abstinent since that admission. He did have MRI/MRCP done November 2024-showing nonenhancing fluid signal within the central pancreatic head measuring 1.9 x 1.2 cm, pancreatic duct diffusely dilated particularly within the pancreatic neck measuring 0.9 cm, findings consistent with sequelae of chronic pancreatitis and pseudocyst.  Also noted hepatic steatosis. He underwent EUS per Dr. Arville Roddy February 2025 with finding of 2 cm hiatal hernia, large nonbleeding diverticulum in the gastric fundus, medium nonbleeding diverticulum in the area of the papillae. There was a cystic lesion seen in the pancreatic head/genu suggestive of a pancreatic pseudocyst this was aspirated and there appeared to be a 5 mm pancreatic ductal stone in the area where pancreatic ductal dilation occurred no sign of pathology in the common bile duct. Cyst fluid analysis showed CEA of 1.0/amylase 59,000 658 and glucose 35-most consistent with a mucinous cyst  He required readmission 12/07/2023 with an acute exacerbation CT at that time showing acute on chronic pancreatitis with diffuse edema and soft tissue  stranding about the head of the pancreas extending into the lesser sac and porta hepatis and new abnormal wall thickening involving the gastric antrum, along the posterior and inferior wall of the gastric antrum there is a 5.0 x 3.7 cm fluid collection consistent with pseudocyst. He says that he recovered from that episode of pancreatitis and had been feeling well, had not been having any residual pain.  He has been taking Creon  with meals. He denies any EtOH over the past 10 months.  He says that he had a mild flare about a week and a half ago of pain and some nausea but no vomiting which she was able to settle down with bowel rest and salt bath. He says he has been eating very healthy, low-fat diet, ate a low-fat home-cooked meal last night, then developed acute abdominal pain about 9 PM which she says was 8 of 10.  He had nausea with it and persistent pain.  He says the pain was bad enough he was unable to drive himself to the emergency room.  Workup in the ER with lipase 338 Potassium 4.0/BUN 9/creatinine 0.77 LFTs within normal limits Albumin  4.3 WBC 12.3/hemoglobin 12.7/hematocrit 40.1/MCV 90.5/platelets 302  Repeat CT today -Small volume perihepatic ascites not changed, severely abnormal pancreas with severe chronic dilation of the main pancreatic duct with atrophied pancreatic head anterior body, coarse calcifications throughout the pancreatic head and uncinate 8 and partially atrophied pancreatic tail.  There is confluent surrounding peripancreatic inflammation especially to the right of midline and affecting the distal stomach and duodenal bulb.  There is an hourglass shaped 3 cm pseudocyst and a rounded 5 cm pseudocyst or peripancreatic hematoma both of which regressed since imaging on 12/07/2023.  No pancreatic necrosis,  spleen stable.  He has been hemodynamically stable, pain medication beneficial but when it wears off pain level about the same today.  Primarily experiencing pain across  the upper abdomen and not so much into the back.  He has been afebrile.  He does have history of hypertension and a bicuspid aortic valve he is status post mechanical aortic valve replacement 2023 and chronically anticoagulated with Coumadin .  He is on Truvada.  Past Medical History:  Diagnosis Date   Asthma    Headache    migraines   Heart murmur    Hypokalemia 06/08/2020   Leukocytosis 01/23/2022   Mild aortic stenosis    Pancreatitis    Pneumonia    as a child x several   Substance abuse [facility])     Past Surgical History:  Procedure Laterality Date   AORTIC VALVE REPLACEMENT N/A 09/27/2021   Procedure: AORTIC VALVE REPLACEMENT (AVR) USING ON-X AORTIC VALVE SIZE ;  Surgeon: Kerrin Elspeth BROCKS, MD;  Location: [facility] OR;  Service: Open Heart Surgery;  Laterality: N/A;   BIOPSY  08/01/2023   Procedure: BIOPSY;  Surgeon: Wilhelmenia Aloha Raddle., MD;  Location: [facility];  Service: Gastroenterology;;   ESOPHAGOGASTRODUODENOSCOPY N/A 08/01/2023   Procedure: ESOPHAGOGASTRODUODENOSCOPY (EGD);  Surgeon: Wilhelmenia Aloha Raddle., MD;  Location: [facility];  Service: Gastroenterology;  Laterality: N/A;   EUS N/A 08/01/2023   Procedure: UPPER ENDOSCOPIC ULTRASOUND (EUS) RADIAL;  Surgeon: Wilhelmenia Aloha Raddle., MD;  Location: [facility];  Service: Gastroenterology;  Laterality: N/A;   FINE NEEDLE ASPIRATION N/A 08/01/2023   Procedure: FINE NEEDLE ASPIRATION (FNA) LINEAR;  Surgeon: Wilhelmenia Aloha Raddle., MD;  Location: [facility];  Service: Gastroenterology;  Laterality: N/A;   INGUINAL HERNIA REPAIR Right 01/05/2022   Procedure: OPEN REPAIR INCARCERATED RIGHT INGUINAL HERNIA WITH MESH;  Surgeon: Tanda Locus, MD;  Location: [facility] ORS;  Service: General;  Laterality: Right;  90 MIN - ROOM 2   INGUINAL HERNIA REPAIR Left 12/20/2022   Procedure: OPEN REPAIR LEFT INGUINAL HERNIA WITH MESH;  Surgeon: Tanda Locus, MD;  Location: [facility];  Service: General;  Laterality: Left;   REPLACEMENT  ASCENDING AORTA N/A 09/27/2021   Procedure: REPLACEMENT ASCENDING AND INNOMINATE ANEURYSM USING HEMASHIELD PLATINUM [id number];  Surgeon: Kerrin Elspeth BROCKS, MD;  Location: [facility] OR;  Service: Open Heart Surgery;  Laterality: N/A;   RIGHT HEART CATH AND CORONARY ANGIOGRAPHY N/A 09/07/2021   Procedure: RIGHT HEART CATH AND CORONARY ANGIOGRAPHY;  Surgeon: Wendel Lurena POUR, MD;  Location: [facility];  Service: Cardiovascular;  Laterality: N/A;   TEE WITHOUT CARDIOVERSION N/A 09/27/2021   Procedure: TRANSESOPHAGEAL ECHOCARDIOGRAM (TEE);  Surgeon: Kerrin Elspeth BROCKS, MD;  Location[facility];  Service: Open Heart Surgery;  Laterality: N/A;    Prior to Admission medications   Medication Sig Start Date End Date Taking? Authorizing Provider  acetaminophen  (TYLENOL ) 500 MG tablet Take 1,000 mg by mouth every 6 (six) hours as needed for moderate pain (pain score 4-6).   Yes [provider]  aspirin  EC 81 MG EC tablet Take 1 tablet (81 mg total) by mouth daily. Swallow whole. 10/02/21  Yes Barrett, Erin R, [name]  dicyclomine  (BENTYL ) 10 MG capsule TAKE 2 CAPSULES(20 MG) BY MOUTH FOUR TIMES DAILY BEFORE MEALS AND AT BEDTIME Patient taking differently: Take 20 mg by mouth 4 (four) times daily -  before meals and at bedtime. 10/25/23  Yes McMichael, Bayley M, [name]  lipase/protease/amylase (CREON ) 36000 UNITS CPEP capsule Take 2 capsules (72,000 Units total) by mouth 3 (three)  times daily before meals. 12/09/23  Yes Perri DELENA Meliton Mickey., MD  loratadine  (CLARITIN ) 10 MG tablet Take 10 mg by mouth daily.   Yes [provider]  methocarbamol  (ROBAXIN ) 500 MG tablet Take 1-2 tablets (500-1,000 mg total) by mouth 4 (four) times daily. Patient taking differently: Take 500 mg by mouth daily as needed for muscle spasms. 11/19/23  Yes Lucius Krabbe, NP  metoprolol  succinate (TOPROL  XL) 25 MG 24 hr tablet Take 0.5 tablets (12.5 mg total) by mouth at bedtime. 07/10/23  Yes Thukkani, Arun K, MD   warfarin (COUMADIN ) 3 MG tablet TAKE 1 1/2 TABLETS TO 2 TABLETS BY MOUTH DAILY OR AS DIRECTED BY COAGULATION CLINIC Patient taking differently: Take 4.5-6 mg by mouth See admin instructions. Take 6mg  (2 tablets) by mouth every Monday and Wednesday, and take 4.5mg  (1 and 1/2 tablet) every other day of the week. 08/21/23  Yes Thukkani, Arun K, MD  emtricitabine -tenofovir  (TRUVADA) 200-300 MG tablet Take 1 tablet by mouth daily. Patient not taking: Reported on 01/11/2024 11/06/23   [provider]    Current Facility-Administered Medications  Medication Dose Route Frequency Provider Last Rate Last Admin   albuterol  (PROVENTIL ) (2.5 MG/3ML) 0.083% nebulizer solution 2.5 mg  2.5 mg Nebulization Q4H PRN Smith, Rondell A, MD       dicyclomine  (BENTYL ) capsule 20 mg  20 mg Oral TID AC & HS Smith, Rondell A, MD       hydrALAZINE  (APRESOLINE ) injection 10 mg  10 mg Intravenous Q4H PRN Claudene, Rondell A, MD       HYDROmorphone  (DILAUDID ) injection 0.5 mg  0.5 mg Intravenous Q2H PRN Smith, Rondell A, MD   0.5 mg at 01/11/24 1408   lactated ringers  infusion   Intravenous Continuous Claudene Reeves A, MD 125 mL/hr at 01/11/24 0953 New Bag at 01/11/24 0953   lipase/protease/amylase (CREON ) capsule 72,000 Units  72,000 Units Oral TID AC Smith, Rondell A, MD       loratadine  (CLARITIN ) tablet 10 mg  10 mg Oral Daily Smith, Rondell A, MD       metoprolol  succinate (TOPROL -XL) 24 hr tablet 12.5 mg  12.5 mg Oral QHS Smith, Rondell A, MD       ondansetron  (ZOFRAN ) tablet 4 mg  4 mg Oral Q6H PRN Claudene Reeves A, MD       Or   ondansetron  (ZOFRAN ) injection 4 mg  4 mg Intravenous Q6H PRN Smith, Rondell A, MD       oxyCODONE -acetaminophen  (PERCOCET/ROXICET) 5-325 MG per tablet 1 tablet  1 tablet Oral Q6H PRN Smith, Rondell A, MD   1 tablet at 01/11/24 1300   sodium chloride  flush (NS) 0.9 % injection 3 mL  3 mL Intravenous Q12H Smith, Rondell A, MD       warfarin (COUMADIN ) tablet 6 mg  6 mg Oral ONCE-1600 Gretel Prentice [facility], [facility]       Warfarin - Pharmacist Dosing Inpatient   Does not apply q1600 Gretel Prentice [facility], [facility]        Allergies as of 01/11/2024 - Review Complete 01/11/2024  Allergen Reaction Noted   Amoxicillin Hives 11/01/2016    Family History  Problem Relation Age of Onset   Pancreatitis Maternal Uncle    Colon cancer Neg Hx    Esophageal cancer Neg Hx     Social History   Socioeconomic History   Marital status: Single    Spouse name: Not on file   Number of children: 0   Years of  education: Not on file   Highest education level: Bachelor's degree (e.g., BA, AB, BS)  Occupational History   Occupation: Bartender    Comment: [employer]  Tobacco Use   Smoking status: Never   Smokeless tobacco: Never  Vaping Use   Vaping status: Never Used  Substance and Sexual Activity   Alcohol use: Not Currently    Comment: none x 1 month as od 04/11/2023   Drug use: Not Currently    Types: Other-see comments    Comment: Edibles occasionally marijuana   Sexual activity: Yes  Other Topics Concern   Not on file  Social History Narrative   Not on file   Social Drivers of Health   Financial Resource Strain: Low Risk  (04/25/2023)   Overall Financial Resource Strain (CARDIA)    Difficulty of Paying Living Expenses: Not very hard  Food Insecurity: No Food Insecurity (12/07/2023)   Hunger Vital Sign    Worried About Running Out of Food in the Last Year: Never true    Ran Out of Food in the Last Year: Never true  Transportation Needs: No Transportation Needs (12/07/2023)   PRAPARE - Administrator, Civil Service (Medical): No    Lack of Transportation (Non-Medical): No  Physical Activity: Sufficiently Active (04/25/2023)   Exercise Vital Sign    Days of Exercise per Week: 5 days    Minutes of Exercise per Session: 150+ min  Stress: No Stress Concern Present (04/25/2023)   [employer] of Occupational Health - Occupational Stress Questionnaire    Feeling of  Stress : Only a little  Social Connections: Unknown (04/25/2023)   Social Connection and Isolation Panel    Frequency of Communication with Friends and Family: Three times a week    Frequency of Social Gatherings with Friends and Family: Once a week    Attends Religious Services: Patient declined    Database administrator or Organizations: No    Attends Engineer, structural: Not on file    Marital Status: Never married  Intimate Partner Violence: Not At Risk (12/07/2023)   Humiliation, Afraid, Rape, and Kick questionnaire    Fear of Current or Ex-Partner: No    Emotionally Abused: No    Physically Abused: No    Sexually Abused: No    Review of Systems: Pertinent positive and negative review of systems were noted in the above HPI section.  All other review of systems was otherwise negative.   Physical Exam: Vital signs in last 24 hours: Temp:  [97.7 F (36.5 C)-98.6 F (37 C)] 98.3 F (36.8 C) (08/02 1154) Pulse Rate:  [51-65] 65 (08/02 1154) Resp:  [15-17] 17 (08/02 1154) BP: (121-150)/(72-89) 130/89 (08/02 1154) SpO2:  [100 %] 100 % (08/02 1154) Weight:  [71 kg] 71 kg (08/02 0138)   General:   Alert,  Well-developed, well-nourished, thin young white male pleasant and cooperative in NAD nontoxic-appearing Head:  Normocephalic and atraumatic. Eyes:  Sclera clear, no icterus.   Conjunctiva pink. Ears:  Normal auditory acuity. Nose:  No deformity, discharge,  or lesions. Mouth:  No deformity or lesions.   Neck:  Supple; no masses or thyromegaly. Lungs:  Clear throughout to auscultation.   No wheezes, crackles, or rhonchi.  Heart:  Regular rate and rhythm; conical valve click Abdomen:  Soft, tender across the upper abdomen, guarding no rebound no palpable mass or hepatosplenomegaly nondistended Rectal: Not done Msk:  Symmetrical without gross deformities. . Pulses:  Normal pulses noted.  Extremities:  Without clubbing or edema. Neurologic:  Alert and  oriented x4;   grossly normal neurologically. Skin:  Intact without significant lesions or rashes.. Psych:  Alert and cooperative. Normal mood and affect.  Intake/Output from previous day: No intake/output data recorded. Intake/Output this shift: No intake/output data recorded.  Lab Results: Recent Labs    01/11/24 0155  WBC 12.3*  HGB 12.7*  HCT 40.1  PLT 302   BMET Recent Labs    01/11/24 0155  NA 138  K 4.0  CL 101  CO2 25  GLUCOSE 132*  BUN 9  CREATININE 0.77  CALCIUM  9.3   LFT Recent Labs    01/11/24 0155  PROT 7.3  ALBUMIN  4.3  AST 27  ALT 25  ALKPHOS 73  BILITOT <0.2   PT/INR Recent Labs    01/11/24 0153  LABPROT 13.7  INR 1.0   Hepatitis Panel No results for input(s): HEPBSAG, HCVAB, HEPAIGM, HEPBIGM in the last 72 hours.    IMPRESSION:  #23 33 year old white male status post mechanical aortic valve replacement 2023, on chronic Coumadin   #2  Acute on chronic pancreatitis with onset of acute abdominal pain last p.m. associated with nausea. Patient has known chronic calcific pancreatitis secondary to previous EtOH abuse.  Patient has been abstinent over the past 10 months.  He has had multiple episodes of pancreatitis over the years, most recent admission for a mild exacerbation June 2025.  CT this admission again with severe chronic dilation of the main pancreatic duct and atrophied pancreatic head and anterior body with coarse calcifications throughout the head and uncinate.  There is surrounding peripancreatic inflammation especially to the right of midline and affecting the distal stomach and duodenal bulb. Previously noted pseudocysts have regressed as last imaging June 2025 and now measure 3 cm and 5 cm.  EUS February 2025-moderate to severe chronic pancreatitis entire pancreas there was a 5 mm intraductal calculi within the head of the pancreas irregularity of contour, dilated sidebranches duct dilation and hyperechoic duct margin.  There is a small  anechoic lesion suggestive of a cyst in the pancreatic head/genu of the pancreas not an obvious communication with the pancreatic duct this was sampled and fluid analysis as above felt consistent with a mucinous cyst  He does not appear to have severe pancreatitis at present though his pain is significant.   Plan; pain control Continue LR at 150 cc/h Clear liquids as tolerated then gradually advance as tolerates Resume Creon  when taking p.o.'s Dr. Wilhelmenia will review images, and previous EUS.  Query whether pancreatic ERCP/pancreatic duct stone extraction and pancreatic stent would be helpful.  Ultimately given the severity of his chronic pancreatitis at his young age also need to explore whether he would be a surgical candidate for a [place] procedure.    Reilley Latorre EsterwoodPA-C  01/11/2024, 4:38 PM

## 2024-01-11 NOTE — Plan of Care (Signed)

## 2024-01-11 NOTE — ED Provider Notes (Signed)
 [facility] EMERGENCY DEPARTMENT AT [facility] Provider Note   CSN: [id number] Arrival date & time: 01/11/24  0134     Patient presents with: Abdominal Pain   [name] [name] is a 33 y.o. male with history of chronic pancreatitis, admitted in June for complication with new pancreatic pseudocyst.  Pancreatic complex cyst at that time.  Presents today with concern for approximately 5 hours of epigastric pain radiating to the right upper quadrant with associated nausea without vomiting, fevers or chills.  And additionally wellness history patient has history of bicuspid aortic valve that was replaced with a mechanical valve; currently anticoagulated on warfarin.   HPI     Prior to Admission medications   Medication Sig Start Date End Date Taking? Authorizing Provider  acetaminophen  (TYLENOL ) 500 MG tablet Take 1,000 mg by mouth every 6 (six) hours as needed for moderate pain (pain score 4-6).   Yes [provider]  aspirin  EC 81 MG EC tablet Take 1 tablet (81 mg total) by mouth daily. Swallow whole. 10/02/21  Yes Barrett, Erin R, [name]  dicyclomine  (BENTYL ) 10 MG capsule TAKE 2 CAPSULES(20 MG) BY MOUTH FOUR TIMES DAILY BEFORE MEALS AND AT BEDTIME Patient taking differently: Take 20 mg by mouth 4 (four) times daily -  before meals and at bedtime. 10/25/23  Yes McMichael, Bayley M, [name]  lipase/protease/amylase (CREON ) 36000 UNITS CPEP capsule Take 2 capsules (72,000 Units total) by mouth 3 (three) times daily before meals. 12/09/23  Yes Perri DELENA Meliton Mickey., MD  loratadine  (CLARITIN ) 10 MG tablet Take 10 mg by mouth daily.   Yes [provider]  methocarbamol  (ROBAXIN ) 500 MG tablet Take 1-2 tablets (500-1,000 mg total) by mouth 4 (four) times daily. Patient taking differently: Take 500 mg by mouth daily as needed for muscle spasms. 11/19/23  Yes Lucius Krabbe, NP  metoprolol  succinate (TOPROL  XL) 25 MG 24 hr tablet Take 0.5 tablets (12.5 mg total) by  mouth at bedtime. 07/10/23  Yes Thukkani, Arun K, MD  warfarin (COUMADIN ) 3 MG tablet TAKE 1 1/2 TABLETS TO 2 TABLETS BY MOUTH DAILY OR AS DIRECTED BY COAGULATION CLINIC Patient taking differently: Take 4.5-6 mg by mouth See admin instructions. Take 6mg  (2 tablets) by mouth every Monday and Wednesday, and take 4.5mg  (1 and 1/2 tablet) every other day of the week. 08/21/23  Yes Thukkani, Arun K, MD  emtricitabine -tenofovir  (TRUVADA) 200-300 MG tablet Take 1 tablet by mouth daily. Patient not taking: Reported on 01/11/2024 11/06/23   [provider]    Allergies: Amoxicillin    Review of Systems  Constitutional:  Positive for appetite change.  Gastrointestinal:  Positive for abdominal pain and nausea.    Updated Vital Signs BP 139/85   Pulse 60   Temp 97.7 F (36.5 C) (Oral)   Resp 15   Ht 5' 11 (1.803 m)   Wt 71 kg   SpO2 100%   BMI 21.83 kg/m   Physical Exam Vitals and nursing note reviewed.  Constitutional:      Appearance: He is not ill-appearing or toxic-appearing.  HENT:     Head: Normocephalic and atraumatic.     Mouth/Throat:     Mouth: Mucous membranes are moist.     Pharynx: No oropharyngeal exudate or posterior oropharyngeal erythema.  Eyes:     General:        Right eye: No discharge.        Left eye: No discharge.     Conjunctiva/sclera: Conjunctivae normal.  Cardiovascular:     Rate and Rhythm: Normal rate and regular rhythm.     Pulses: Normal pulses.  Pulmonary:     Effort: Pulmonary effort is normal. No respiratory distress.     Breath sounds: Normal breath sounds. No wheezing or rales.  Abdominal:     General: Bowel sounds are normal. There is no distension.     Palpations: Abdomen is soft.     Tenderness: There is abdominal tenderness in the right upper quadrant and epigastric area. There is no right CVA tenderness, left CVA tenderness, guarding or rebound.  Musculoskeletal:        General: No deformity.     Cervical back: Neck supple.   Skin:    General: Skin is warm and dry.     Capillary Refill: Capillary refill takes less than 2 seconds.  Neurological:     General: No focal deficit present.     Mental Status: He is alert and oriented to person, place, and time. Mental status is at baseline.  Psychiatric:        Mood and Affect: Mood normal.     (all labs ordered are listed, but only abnormal results are displayed) Labs Reviewed  LIPASE, BLOOD - Abnormal; Notable for the following components:      Result Value   Lipase 338 (*)    All other components within normal limits  COMPREHENSIVE METABOLIC PANEL WITH GFR - Abnormal; Notable for the following components:   Glucose, Bld 132 (*)    All other components within normal limits  CBC - Abnormal; Notable for the following components:   WBC 12.3 (*)    Hemoglobin 12.7 (*)    All other components within normal limits  URINALYSIS, ROUTINE W REFLEX MICROSCOPIC - Abnormal; Notable for the following components:   APPearance CLOUDY (*)    All other components within normal limits  PROTIME-INR    EKG: None  Radiology: CT ABDOMEN PELVIS W CONTRAST Result Date: 01/11/2024 CLINICAL DATA:  33 year old male with severe abdominal pain since 2100 hours. Pain radiating to the back with nausea. History of complicated pancreatitis. Abnormal CT Abdomen and Pelvis in June. EXAM: CT ABDOMEN AND PELVIS WITH CONTRAST TECHNIQUE: Multidetector CT imaging of the abdomen and pelvis was performed using the standard protocol following bolus administration of intravenous contrast. RADIATION DOSE REDUCTION: This exam was performed according to the departmental dose-optimization program which includes automated exposure control, adjustment of the mA and/or kV according to patient size and/or use of iterative reconstruction technique. CONTRAST:  75mL OMNIPAQUE  IOHEXOL  350 MG/ML SOLN COMPARISON:  CT Abdomen and Pelvis 12/07/2023 and earlier. FINDINGS: Lower chest: Previous sternotomy. Partially  visible prosthetic heart valve. Normal heart size. No pericardial or pleural effusion. Mild dependent atelectasis. Hepatobiliary: Small volume perihepatic ascites has not significantly changed. Inflammation at the porta hepatis not significantly changed. Gallbladder only mildly affected. Liver enhancement remains normal. No bile duct enlargement. Pancreas: Severely abnormal. Severe chronic dilatation of the main pancreatic duct with atrophied pancreatic head, anterior body. Coarse dystrophic calcifications throughout the pancreatic head and uncinate. Partially atrophied pancreatic tail. Confluent surrounding peripancreatic inflammation especially to the right of midline and severely affecting the distal stomach and duodenal bulb as before. However, both an hourglass shaped 3 cm low-density pseudocyst and rounded 5 cm complicated pseudocyst or peripancreatic hematoma have regressed since 12/07/2023 (small residual on series 3, images 34 and 39, respectively). No progressive pancreatic collection. No new pancreatic necrosis identified. Spleen: Stable and negative. Adrenals/Urinary Tract: Stable and negative.  Stomach/Bowel: Nondilated large and small bowel throughout the abdomen and pelvis. However, confluent inflammation throughout the distal stomach, proximal duodenum (coronal images 96 through 109), ongoing since June and severe. Second portion of the duodenum appears less inflamed, nondilated. Moderate inflammation throughout the hepatic flexure which abuts the abnormal 1st portion of the duodenum (also image 96, series 3, image 40. Proximal stomach mildly distended with gas and fluid. And there is a small juxta cardiac gastric diverticulum incidentally noted on series 3, image 11, stable. Inflamed distal stomach and proximal duodenum decompressed. Inflamed hepatic flexure decompressed. Upstream non dilated right:. Retrocecal appendix nondilated. No pneumoperitoneum identified. Small volume of simple density free  fluid in the right upper quadrant and in the right gutter (series 3, images 28 and 40). Vascular/Lymphatic: Portal venous system remains patent although with chronic mass effect or narrowing on the superior mesenteric vein at the pancreas (series 3, image 33), unchanged. Splenic vein remains patent. Major arterial structures in the abdomen and pelvis remain patent. Normal caliber abdominal aorta. No lymphadenopathy identified. Reproductive: Negative. Other: Small volume pelvic ascites with simple fluid density has not significantly changed from June (series 3, image 83). Musculoskeletal: Chronic sternotomy. Maintained vertebral height. No acute osseous abnormality identified. IMPRESSION: 1. Severe sequelae of advanced Pancreatitis, with pronounced and confluent ongoing inflammation of the distal stomach, the proximal duodenum, and the adjacent hepatic flexure of colon. But 3 cm pancreatic pseudocyst and 5 cm peripancreatic complex cyst or hematoma have both regressed since June. Small volume ascites in the right upper quadrant, the right gutter, and the pelvis not significantly changed. And stable narrowing of the SMV which remains patent. No new or progressed complicating features identified since June. 2. Chronic sternotomy.  Minor lung base atelectasis. Electronically Signed   By: VEAR Hurst M.D.   On: 01/11/2024 04:39     Procedures   Medications Ordered in the ED  lactated ringers  infusion (has no administration in time range)  ondansetron  (ZOFRAN ) injection 4 mg (4 mg Intravenous Given 01/11/24 0435)  morphine  (PF) 4 MG/ML injection 4 mg (4 mg Intravenous Given 01/11/24 0435)  lactated ringers  bolus 1,000 mL (1,000 mLs Intravenous New Bag/Given 01/11/24 0435)  iohexol  (OMNIPAQUE ) 350 MG/ML injection 75 mL (75 mLs Intravenous Contrast Given 01/11/24 0415)    Clinical Course as of 01/11/24 9386  Sat Jan 11, 2024  0608 Consul to Dr. Franky, hospitalist, who is agreeable to admitting this patient to his  service. I appreciate his collaboration in the care of this patient.  [RS]    Clinical Course User Index [RS] Brysun Eschmann, Pleasant SAUNDERS, [name]                                 Medical Decision Making 33 year old male with history of chronic pancreatitis who presents with concern for epigastric and right upper quadrant pain  Mild hypertensive on intake, vital signs otherwise normal.  Patient is uncomfortable.  To my arrival to the bedside but no acute distress.  Cardiopulmonary dam unremarkable, abdominal exam is as above with epigastric and right upper quadrant tender to palpation without rebound or guarding.  The differential diagnosis for RUQ includes but is not limited to:  Cholelithiasis / choledocholithiasis / cholecystitis / cholangitis, hepatitis (eg. viral, alcoholic, toxic),liver abscess, pancreatitis, liver / pancreatic / biliary tract cancer, ischemic hepatopathy (shock liver), hepatic vein obstruction (Budd-Chiari syndrome), liver cell adenoma, peptic ulcer disease (duodenal), functional or nonulcer dyspepsia, right lower lobe  pneumonia, pyelonephritis, urinary calculi,  Fitz-Hugh-Curtis syndrome (with pelvic inflammatory disease), herpes zoster, trauma or musculoskeletal pain, herniated disk, abdominal abscess, intestinal ischemia, physical or sexual abuse, ectopic pregnancy, IUP, Mittelschmerz, ovarian cyst/torsion, threatened/ievitable abortion, PID, endometriosis, molar pregnancy, heterotopic pregnancy, corpus luteum cyst, appendicitis, UTI/renal colic, IBD.    Amount and/or Complexity of Data Reviewed Labs: ordered.    Details: CBC with leukocytosis of 12.3.  CMP unremarkable, lipase 338.  UA unremarkable and INR is normal.   Radiology: ordered.    Details: CT abdomen pelvis with severe sequela of advanced pancreatitis and fluid ongoing inflammation of the distal stomach proximal duodenum and hepatic flexure of the colon.  Stable 3 cm pancreatic pseudocyst and 5 cm peripancreatic  complex cyst versus hematoma.  Small volume ascites in the right upper quadrant.   Risk Prescription drug management. Decision regarding hospitalization.     Patient will require admission to the hospital for further stabilization and context of severe acute on chronic pancreatitis.  Pain well-controlled after single dose of analgesia in the ED.  [name]  voiced understanding of his medical evaluation and treatment plan. Each of their questions answered to their expressed satisfaction. He is amenable to plan for admission at this time.   This chart was dictated using voice recognition software, Dragon. Despite the best efforts of this provider to proofread and correct errors, errors may still occur which can change documentation meaning.      Final diagnoses:  Acute pancreatitis without infection or necrosis, unspecified pancreatitis type    ED Discharge Orders     None          Bobette Pleasant JONELLE DEVONNA 01/11/24 9386    Carita Senior, MD 01/11/24 [phone]

## 2024-01-11 NOTE — ED Notes (Signed)
 Pt given a urinal with instructions for a sample. Verbalizes understanding.

## 2024-01-11 NOTE — ED Notes (Signed)
 Pt transported to CT ?

## 2024-01-11 NOTE — ED Triage Notes (Addendum)
 Upper ABD pain that started about 9pm that radiates to his back. +nausea/no vomiting. Worse with movement.  Takes warfarin due to mechanical heart valve. Hx of pancreatitis but reports this feels different.

## 2024-01-11 NOTE — Progress Notes (Signed)
 PHARMACY - ANTICOAGULATION CONSULT NOTE  Pharmacy Consult for warfarin Indication: Mechanical AVR  Allergies  Allergen Reactions   Amoxicillin Hives    Patient Measurements: Height: 5' 11 (180.3 cm) Weight: 71 kg (156 lb 8.4 oz) IBW/kg (Calculated) : 75.3 HEPARIN  DW (KG): 71  Vital Signs: Temp: 97.7 F (36.5 C) (08/02 0809) Temp Source: Oral (08/02 0809) BP: 121/72 (08/02 0809) Pulse Rate: 51 (08/02 0809)  Labs: Recent Labs    01/11/24 0153 01/11/24 0155  HGB  --  12.7*  HCT  --  40.1  PLT  --  302  LABPROT 13.7  --   INR 1.0  --   CREATININE  --  0.77    Estimated Creatinine Clearance: 133.1 mL/min (by C-G formula based on SCr of 0.77 mg/dL).   Medical History: Past Medical History:  Diagnosis Date   Asthma    Headache    migraines   Heart murmur    Hypokalemia 06/08/2020   Leukocytosis 01/23/2022   Mild aortic stenosis    Pancreatitis    Pneumonia    as a child x several   Substance abuse (HCC)     Medications:  Medications Prior to Admission  Medication Sig Dispense Refill Last Dose/Taking   acetaminophen  (TYLENOL ) 500 MG tablet Take 1,000 mg by mouth every 6 (six) hours as needed for moderate pain (pain score 4-6).   01/10/2024 Bedtime   aspirin  EC 81 MG EC tablet Take 1 tablet (81 mg total) by mouth daily. Swallow whole. 30 tablet 11 01/10/2024 Morning   dicyclomine  (BENTYL ) 10 MG capsule TAKE 2 CAPSULES(20 MG) BY MOUTH FOUR TIMES DAILY BEFORE MEALS AND AT BEDTIME (Patient taking differently: Take 20 mg by mouth 4 (four) times daily -  before meals and at bedtime.) 360 capsule 1 01/10/2024 Bedtime   lipase/protease/amylase (CREON ) 36000 UNITS CPEP capsule Take 2 capsules (72,000 Units total) by mouth 3 (three) times daily before meals.   01/10/2024 Evening   loratadine  (CLARITIN ) 10 MG tablet Take 10 mg by mouth daily.   01/10/2024 Morning   methocarbamol  (ROBAXIN ) 500 MG tablet Take 1-2 tablets (500-1,000 mg total) by mouth 4 (four) times daily. (Patient  taking differently: Take 500 mg by mouth daily as needed for muscle spasms.) 30 tablet 0 Past Week   metoprolol  succinate (TOPROL  XL) 25 MG 24 hr tablet Take 0.5 tablets (12.5 mg total) by mouth at bedtime. 45 tablet 2 01/10/2024 Bedtime   warfarin (COUMADIN ) 3 MG tablet TAKE 1 1/2 TABLETS TO 2 TABLETS BY MOUTH DAILY OR AS DIRECTED BY COAGULATION CLINIC (Patient taking differently: Take 4.5-6 mg by mouth See admin instructions. Take 6mg  (2 tablets) by mouth every Monday and Wednesday, and take 4.5mg  (1 and 1/2 tablet) every other day of the week.) 140 tablet 0 01/10/2024 at  8:00 PM   emtricitabine -tenofovir  (TRUVADA) 200-300 MG tablet Take 1 tablet by mouth daily. (Patient not taking: Reported on 01/11/2024)   Not Taking   Scheduled:   lipase/protease/amylase  72,000 Units Oral TID AC   metoprolol  succinate  12.5 mg Oral QHS   sodium chloride  flush  3 mL Intravenous Q12H    Assessment: 81 you male here with abdominal pain.  He is on warfarin for an On-X mechanical AVR placed in 09/2021. Pharmacy consulted to dose warfarin  -INR= 1.0  Home dose: 4.5mg /day except take 6mg  on MW (last dose was 01/10/2024)  Goal of Therapy:  INR 1.5-2.0 Monitor platelets by anticoagulation protocol: Yes   Plan:  -Warfarin 6mg   po today -PT/INR daily  Prentice Poisson, PharmD Clinical Pharmacist **Pharmacist phone directory can now be found on [email or web address] ([email or web address]).  Listed under [facility].

## 2024-01-11 NOTE — H&P (Signed)
 History and Physical    Patient: [name] [name] FMW:[id number] DOB: 1991-04-18 DOA: 01/11/2024 DOS: the patient was seen and examined on 01/11/2024 PCP: Lucius Krabbe, NP  Patient coming from: Home  Chief Complaint:  Chief Complaint  Patient presents with   Abdominal Pain   HPI: [name] [name] is a 33 y.o. male with medical history significant of hypertension, bicuspid aortic valve with severe aortic insufficiency and moderate aortic stenosis s/p mechanical aortic valve replacement in arthroplasty on 09/27/2021 on chronic anticoagulation, recurrent pancreatitis, asthma, GERD, and prior history of alcohol abuse presents with abdominal pain.  The patient has been experiencing abdominal pain since around 9 PM last night, describing it as more intense than usual and primarily located in the left upper quadrant, with increased pain on the right side as well. The pain is exacerbated by movement. He has nausea, but has not vomited.  He has a history of pancreatic pseudocyst, which was previously evaluated with an endoscopy in April. A follow-up endoscopy was advised six months later.  At that time he reported also having at least one pancreatic stone in a duct. He was just recently hospitalized in June for reoccurrence of pancreatitis and was treated with IV fluids and pain medication. He has experienced flare-ups that did not require hospitalization, with the most recent one occurring a week and a half ago.  He has a history of heart surgery with valve replacement and is on anticoagulation therapy with an INR goal of 1.5 to 2.  He has not consumed alcohol in the past ten months. He reports having loose stools and mild gas but no fever or chills. He has been off Truvada for about a week and a half due to stomach upset. Current medications include Creon , which he takes with meals, and metoprolol .  In the ED patient was noted to be mildly bradycardic with all vital signs  otherwise maintained.  Labs significant for lipase 338, WBC 12.3, and hemoglobin 12.7.  CT scan of the abdomen pelvis noted severe sequelae of advanced pancreatitis with pronounced and confluent ongoing inflammation of the distal stomach the proximal duodenum and adjacent hepatic flexure with a 3 cm pancreatic pseudocyst and 5 cm pancreatic complex cyst or hematoma with regression since June.  Patient was given 1 L bolus of lactated Ringer 's, morphine , and Zofran .  Review of Systems: As mentioned in the history of present illness. All other systems reviewed and are negative. Past Medical History:  Diagnosis Date   Asthma    Headache    migraines   Heart murmur    Hypokalemia 06/08/2020   Leukocytosis 01/23/2022   Mild aortic stenosis    Pancreatitis    Pneumonia    as a child x several   Substance abuse [facility])    Past Surgical History:  Procedure Laterality Date   AORTIC VALVE REPLACEMENT N/A 09/27/2021   Procedure: AORTIC VALVE REPLACEMENT (AVR) USING ON-X AORTIC VALVE SIZE ;  Surgeon: Kerrin Elspeth BROCKS, MD;  Location: [facility] OR;  Service: Open Heart Surgery;  Laterality: N/A;   BIOPSY  08/01/2023   Procedure: BIOPSY;  Surgeon: Wilhelmenia Aloha Raddle., MD;  Location: [facility];  Service: Gastroenterology;;   ESOPHAGOGASTRODUODENOSCOPY N/A 08/01/2023   Procedure: ESOPHAGOGASTRODUODENOSCOPY (EGD);  Surgeon: Wilhelmenia Aloha Raddle., MD;  Location: [facility];  Service: Gastroenterology;  Laterality: N/A;   EUS N/A 08/01/2023   Procedure: UPPER ENDOSCOPIC ULTRASOUND (EUS) RADIAL;  Surgeon: Wilhelmenia Aloha Raddle., MD;  Location: [facility];  Service: Gastroenterology;  Laterality:  N/A;   FINE NEEDLE ASPIRATION N/A 08/01/2023   Procedure: FINE NEEDLE ASPIRATION (FNA) LINEAR;  Surgeon: Wilhelmenia Aloha Raddle., MD;  Location: [facility];  Service: Gastroenterology;  Laterality: N/A;   INGUINAL HERNIA REPAIR Right 01/05/2022   Procedure: OPEN REPAIR INCARCERATED RIGHT INGUINAL HERNIA  WITH MESH;  Surgeon: Tanda Locus, MD;  Location: [facility] ORS;  Service: General;  Laterality: Right;  90 MIN - ROOM 2   INGUINAL HERNIA REPAIR Left 12/20/2022   Procedure: OPEN REPAIR LEFT INGUINAL HERNIA WITH MESH;  Surgeon: Tanda Locus, MD;  Location: [facility];  Service: General;  Laterality: Left;   REPLACEMENT ASCENDING AORTA N/A 09/27/2021   Procedure: REPLACEMENT ASCENDING AND INNOMINATE ANEURYSM USING HEMASHIELD PLATINUM [id number];  Surgeon: Kerrin Elspeth BROCKS, MD;  Location: [facility] OR;  Service: Open Heart Surgery;  Laterality: N/A;   RIGHT HEART CATH AND CORONARY ANGIOGRAPHY N/A 09/07/2021   Procedure: RIGHT HEART CATH AND CORONARY ANGIOGRAPHY;  Surgeon: Wendel Lurena POUR, MD;  Location: [facility];  Service: Cardiovascular;  Laterality: N/A;   TEE WITHOUT CARDIOVERSION N/A 09/27/2021   Procedure: TRANSESOPHAGEAL ECHOCARDIOGRAM (TEE);  Surgeon: Kerrin Elspeth BROCKS, MD;  Location: [facility] OR;  Service: Open Heart Surgery;  Laterality: N/A;   Social History:  reports that he has never smoked. He has never used smokeless tobacco. He reports that he does not currently use alcohol. He reports that he does not currently use drugs after having used the following drugs: Other-see comments.  Allergies  Allergen Reactions   Amoxicillin Hives    Family History  Problem Relation Age of Onset   Pancreatitis Maternal Uncle    Colon cancer Neg Hx    Esophageal cancer Neg Hx     Prior to Admission medications   Medication Sig Start Date End Date Taking? Authorizing Provider  acetaminophen  (TYLENOL ) 500 MG tablet Take 1,000 mg by mouth every 6 (six) hours as needed for moderate pain (pain score 4-6).   Yes [provider]  aspirin  EC 81 MG EC tablet Take 1 tablet (81 mg total) by mouth daily. Swallow whole. 10/02/21  Yes Barrett, Erin R, [name]  dicyclomine  (BENTYL ) 10 MG capsule TAKE 2 CAPSULES(20 MG) BY MOUTH FOUR TIMES DAILY BEFORE MEALS AND AT BEDTIME Patient taking differently: Take  20 mg by mouth 4 (four) times daily -  before meals and at bedtime. 10/25/23  Yes McMichael, Bayley M, [name]  lipase/protease/amylase (CREON ) 36000 UNITS CPEP capsule Take 2 capsules (72,000 Units total) by mouth 3 (three) times daily before meals. 12/09/23  Yes Perri DELENA Meliton Raddle., MD  loratadine  (CLARITIN ) 10 MG tablet Take 10 mg by mouth daily.   Yes [provider]  methocarbamol  (ROBAXIN ) 500 MG tablet Take 1-2 tablets (500-1,000 mg total) by mouth 4 (four) times daily. Patient taking differently: Take 500 mg by mouth daily as needed for muscle spasms. 11/19/23  Yes Lucius Krabbe, NP  metoprolol  succinate (TOPROL  XL) 25 MG 24 hr tablet Take 0.5 tablets (12.5 mg total) by mouth at bedtime. 07/10/23  Yes Thukkani, Arun K, MD  warfarin (COUMADIN ) 3 MG tablet TAKE 1 1/2 TABLETS TO 2 TABLETS BY MOUTH DAILY OR AS DIRECTED BY COAGULATION CLINIC Patient taking differently: Take 4.5-6 mg by mouth See admin instructions. Take 6mg  (2 tablets) by mouth every Monday and Wednesday, and take 4.5mg  (1 and 1/2 tablet) every other day of the week. 08/21/23  Yes Thukkani, Arun K, MD  emtricitabine -tenofovir  (TRUVADA) 200-300 MG tablet Take 1 tablet by mouth daily. Patient not  taking: Reported on 01/11/2024 11/06/23   [provider]    Physical Exam: Vitals:   01/11/24 0140 01/11/24 0400 01/11/24 0532 01/11/24 0809  BP: (!) 150/86 139/85  121/72  Pulse: (!) 57 60  (!) 51  Resp: 16 15  16   Temp: 98.6 F (37 C)  97.7 F (36.5 C) 97.7 F (36.5 C)  TempSrc:   Oral Oral  SpO2: 100% 100%  100%  Weight:      Height:        Constitutional: Young male currently in no acute distress Eyes: PERRL, lids and conjunctivae normal ENMT: Mucous membranes are moist.   Neck: normal, supple, no masses, no thyromegaly Respiratory: clear to auscultation bilaterally, no wheezing, no crackles. Normal respiratory effort. No accessory muscle use.  Cardiovascular: Regular rate and rhythm, positive systolic  click. No extremity edema. 2+ pedal pulses.   Abdomen: Generalized epigastric tenderness to palpation.  No guarding appreciated.  Bowel sounds positive.  Musculoskeletal: no clubbing / cyanosis. No joint deformity upper and lower extremities. Good ROM, no contractures. Normal muscle tone.  Skin: Healed substernal scar. Neurologic: CN 2-12 grossly intact.   Strength 5/5 in all 4.  Psychiatric: Normal judgment and insight. Alert and oriented x 3. Normal mood.   Data Reviewed:  EKG revealed sinus bradycardia at 58 bpm with ST elevations but appears similar to prior EKGs.  Reviewed labs, imaging, pertinent records as documented.  Assessment and Plan:  Recurrent pancreatitis with pseudocyst Acute on chronic.  Patient presents with recurrence of abdominal pain with reports of pain being in the left upper quadrant/epigastric area and radiating over more to the right than previously before.  Lipase elevated at 338.  CT imaging noted  severe sequelae of advanced pancreatitis with pronounced and confluent ongoing inflammation of the distal stomach the proximal duodenum and adjacent hepatic flexure with a 3 cm pancreatic pseudocyst and 5 cm pancreatic complex cyst or hematoma with regression since June when last hospitalized for the same.  He has been following in outpatient setting with GI with plans for possible further intervention due to reoccurrence of symptoms.  Patient had been started on IV fluids and IV pain medication. - Admit to a telemetry bed - Incentive spirometry - Add on CRP - Clear liquid diet and advance as tolerated - Continue IV fluids at 125 mL/h - Percocet/Dilaudid  IV as needed for pain - Continue Creon  - GI consulted to evaluate,  will follow-up for any further recommendations  History of bicuspid aortic valve with severe aortic insufficiency and moderate aortic stenosis s/p mechanical aortic valve replacement with arthroplasty Subtherapeutic INR INR noted to be 1.  Goal is  1.5-2 on Coumadin  and aspirin . -  Coumadin  per pharmacy  Essential hypertension Blood pressures currently 121/72-150/86 - Continue Metroprolol with holding parameters for bradycardia  History of asthma No wheezing appreciated on physical exam. - Albuterol  nebs as needed for shortness of breath/wheezing  On preexposure prophylaxis for HIV Patient had been on Truvada for prophylaxis but stopped about a week and a half ago due to his symptoms.  Last HIV test nonreactive 04/03/2023.  History of alcohol abuse Patient reports being sober for the last 10 months.  DVT prophylaxis: Lovenox  Advance Care Planning:   Code Status: Full Code   Consults: Gastroenterology  Family Communication: None  Severity of Illness: The appropriate patient status for this patient is INPATIENT. Inpatient status is judged to be reasonable and necessary in order to provide the required intensity of service to ensure  the patient's safety. The patient's presenting symptoms, physical exam findings, and initial radiographic and laboratory data in the context of their chronic comorbidities is felt to place them at high risk for further clinical deterioration. Furthermore, it is not anticipated that the patient will be medically stable for discharge from the hospital within 2 midnights of admission.   * I certify that at the point of admission it is my clinical judgment that the patient will require inpatient hospital care spanning beyond 2 midnights from the point of admission due to high intensity of service, high risk for further deterioration and high frequency of surveillance required.*  Author: Maximino DELENA Sharps, MD 01/11/2024 8:35 AM  For on call review [email or web address].

## 2024-01-12 DIAGNOSIS — K8689 Other specified diseases of pancreas: Secondary | ICD-10-CM

## 2024-01-12 DIAGNOSIS — K859 Acute pancreatitis without necrosis or infection, unspecified: Secondary | ICD-10-CM | POA: Diagnosis not present

## 2024-01-12 DIAGNOSIS — K861 Other chronic pancreatitis: Secondary | ICD-10-CM | POA: Diagnosis not present

## 2024-01-12 LAB — COMPREHENSIVE METABOLIC PANEL WITH GFR
ALT: 20 U/L (ref 0–44)
AST: 18 U/L (ref 15–41)
Albumin: 3.6 g/dL (ref 3.5–5.0)
Alkaline Phosphatase: 64 U/L (ref 38–126)
Anion gap: 9 (ref 5–15)
BUN: 6 mg/dL (ref 6–20)
CO2: 27 mmol/L (ref 22–32)
Calcium: 9 mg/dL (ref 8.9–10.3)
Chloride: 101 mmol/L (ref 98–111)
Creatinine, Ser: 0.6 mg/dL — ABNORMAL LOW (ref 0.61–1.24)
GFR, Estimated: >60 mL/min (ref >60)
Glucose, Bld: 119 mg/dL — ABNORMAL HIGH (ref 70–99)
Potassium: 4.2 mmol/L (ref 3.5–5.1)
Sodium: 137 mmol/L (ref 135–145)
Total Bilirubin: 0.6 mg/dL (ref 0.0–1.2)
Total Protein: 6.5 g/dL (ref 6.5–8.1)

## 2024-01-12 LAB — CBC
HCT: 38.8 % — ABNORMAL LOW (ref 39.0–52.0)
Hemoglobin: 12.7 g/dL — ABNORMAL LOW (ref 13.0–17.0)
MCH: 28.9 pg (ref 26.0–34.0)
MCHC: 32.7 g/dL (ref 30.0–36.0)
MCV: 88.2 fL (ref 80.0–100.0)
Platelets: 268 K/uL (ref 150–400)
RBC: 4.4 MIL/uL (ref 4.22–5.81)
RDW: 14.3 % (ref 11.5–15.5)
WBC: 9.7 K/uL (ref 4.0–10.5)
nRBC: 0 % (ref 0.0–0.2)

## 2024-01-12 LAB — PROTIME-INR
INR: 0.9 (ref 0.8–1.2)
Prothrombin Time: 13.1 s (ref 11.4–15.2)

## 2024-01-12 MED ORDER — HYDROMORPHONE HCL 1 MG/ML IJ SOLN: 0.5000 mg | INTRAMUSCULAR | Status: DC | PRN

## 2024-01-12 MED ORDER — OXYCODONE-ACETAMINOPHEN 5-325 MG PO TABS
1.0000 | ORAL_TABLET | Freq: Four times a day (QID) | ORAL | Status: DC | PRN
  Administered 2024-01-12 – 2024-01-17 (×6): 2 via ORAL
  Administered 2024-01-18: 1 via ORAL
  Administered 2024-01-20 (×2): 2 via ORAL
  Administered 2024-01-21 – 2024-01-22 (×4): 1 via ORAL
  Administered 2024-01-22 (×2): 2 via ORAL
  Administered 2024-01-22 – 2024-01-26 (×6): 1 via ORAL
  Filled 2024-01-12: qty 1
  Filled 2024-01-12 (×2): qty 2
  Filled 2024-01-12: qty 1
  Filled 2024-01-12: qty 2
  Filled 2024-01-12 (×4): qty 1
  Filled 2024-01-12 (×3): qty 2
  Filled 2024-01-12 (×2): qty 1
  Filled 2024-01-12: qty 2
  Filled 2024-01-12: qty 1
  Filled 2024-01-12: qty 2
  Filled 2024-01-12: qty 1

## 2024-01-12 MED ORDER — HYDROMORPHONE HCL 1 MG/ML IJ SOLN
0.5000 mg | INTRAMUSCULAR | Status: DC | PRN
  Administered 2024-01-12 – 2024-01-22 (×15): 1 mg via INTRAVENOUS
  Administered 2024-01-23: 0.5 mg via INTRAVENOUS
  Administered 2024-01-23 (×2): 1 mg via INTRAVENOUS
  Administered 2024-01-24: 0.5 mg via INTRAVENOUS
  Administered 2024-01-24 (×2): 1 mg via INTRAVENOUS
  Administered 2024-01-24: 0.5 mg via INTRAVENOUS
  Administered 2024-01-24 – 2024-01-27 (×11): 1 mg via INTRAVENOUS
  Filled 2024-01-12 (×30): qty 1

## 2024-01-12 MED ORDER — WARFARIN SODIUM 6 MG PO TABS
6.0000 mg | ORAL_TABLET | Freq: Once | ORAL | Status: AC
  Administered 2024-01-12: 6 mg via ORAL
  Filled 2024-01-12: qty 1

## 2024-01-12 NOTE — Progress Notes (Signed)
 PROGRESS NOTE  [name] [name]  FMW:[id number] DOB: January 05, 1991 DOA: 01/11/2024 PCP: Lucius Krabbe, NP  Consultants  Brief Narrative: [name] [name] is a 33 y.o. male with medical history significant of hypertension, bicuspid aortic valve with severe aortic insufficiency and moderate aortic stenosis s/p mechanical aortic valve replacement in arthroplasty on 09/27/2021 on chronic anticoagulation, recurrent pancreatitis, asthma, GERD, and prior history of alcohol abuse presents with abdominal pain.  Presenting to emergency department with 1 day of severe abdominal pain night prior to admission.  Described as more intense than usual and in left upper quadrant extending to epigastrium.  Known history of pancreatic pseudocyst evaluated with endoscopy in April of this year.  Multiple hospitalizations for pancreatitis.  Most recent in June of this year treated with IV analgesics and hydration  Assessment & Plan: Recurrent pancreatitis with pseudocyst - Acute on chronic.  Recurrent -Still with fair amount of pain.  I have increased his pain medications. - Greatly appreciate GI input. - Plan is to repeat inflammatory markers and attempt to advance diet.  If not able to advance diet will need core track placed. - Possibility of celiac block might be required in future per GI if unable to improve pain threshold. - Continue IV analgesics, antiemetics, fluid hydration.   History of bicuspid aortic valve with severe aortic insufficiency and moderate aortic stenosis s/p mechanical aortic valve replacement with arthroplasty Subtherapeutic INR INR noted to be 1.  Goal is 1.5-2 on Coumadin  and aspirin . -  Coumadin  per pharmacy   Essential hypertension Blood pressures currently 121/72-150/86 - Continue Metroprolol with holding parameters for bradycardia   History of asthma No wheezing appreciated on physical exam. - Albuterol  nebs as needed for shortness of breath/wheezing    On preexposure prophylaxis for HIV - Patient had been on Truvada for prophylaxis but stopped about a week and a half ago due to his symptoms.  Last HIV test nonreactive 04/03/2023. -Resume on discharge   History of alcohol abuse - Patient reports being sober for the last 10 months.    DVT prophylaxis:   warfarin (COUMADIN ) tablet 6 mg  Code Status:   Code Status: Full Code Level of care: Telemetry Medical Status is: Inpatient   Consults called: Gastroenterology  Subjective: Patient still in fair amount of pain.  He is unable to move much in bed.  Pain was worsened with broth this morning.  No nausea on my examination this morning but has had some nausea as the day is continued to per nursing.  Objective: Vitals:   01/12/24 0145 01/12/24 0145 01/12/24 0424 01/12/24 0819  BP: (!) 122/94 (!) 122/94 121/81 124/86  Pulse: 75 72 70 85  Resp:   16   Temp: 98.3 F (36.8 C) 98.3 F (36.8 C) 98.1 F (36.7 C) 98.1 F (36.7 C)  TempSrc:   Oral Oral  SpO2: 100% 99% 99% 98%  Weight:      Height:        Intake/Output Summary (Last 24 hours) at 01/12/2024 1448 Last data filed at 01/12/2024 0353 Gross per 24 hour  Intake 2265.19 ml  Output --  Net 2265.19 ml   Filed Weights   01/11/24 0138  Weight: 71 kg   Body mass index is 21.83 kg/m.  Gen: 33 y.o. male in no apparent distress.  Nontoxic Pulm: Non-labored breathing.  Clear to auscultation bilaterally.  CV: Regular rate and rhythm. GI: Abdomen soft, nondistended.  With notable tenderness epigastrium and bilateral upper quadrants.  Also with  voluntary guarding. Ext: Warm, no deformities, no pedal edema Skin: No rashes, lesions  Neuro: Alert and oriented. No focal neurological deficits. Psych: Calm  Judgement and insight appear normal. Mood & affect appropriate.     I have personally reviewed the following labs and images: CBC: Recent Labs  Lab 01/11/24 0155 01/12/24 0216  WBC 12.3* 9.7  HGB 12.7* 12.7*  HCT 40.1  38.8*  MCV 90.5 88.2  PLT 302 268   BMP &GFR Recent Labs  Lab 01/11/24 0155 01/12/24 0216  NA 138 137  K 4.0 4.2  CL 101 101  CO2 25 27  GLUCOSE 132* 119*  BUN 9 6  CREATININE 0.77 0.60*  CALCIUM  9.3 9.0   Estimated Creatinine Clearance: 133.1 mL/min (A) (by C-G formula based on SCr of 0.6 mg/dL (L)). Liver & Pancreas: Recent Labs  Lab 01/11/24 0155 01/12/24 0216  AST 27 18  ALT 25 20  ALKPHOS 73 64  BILITOT <0.2 0.6  PROT 7.3 6.5  ALBUMIN  4.3 3.6   Recent Labs  Lab 01/11/24 0155  LIPASE 338*   No results for input(s): AMMONIA in the last 168 hours. Diabetic: No results for input(s): HGBA1C in the last 72 hours. No results for input(s): GLUCAP in the last 168 hours. Cardiac Enzymes: No results for input(s): CKTOTAL, CKMB, CKMBINDEX, TROPONINI in the last 168 hours. No results for input(s): PROBNP in the last 8760 hours. Coagulation Profile: Recent Labs  Lab 01/11/24 0153 01/12/24 0216  INR 1.0 0.9   Thyroid Function Tests: No results for input(s): TSH, T4TOTAL, FREET4, T3FREE, THYROIDAB in the last 72 hours. Lipid Profile: No results for input(s): CHOL, HDL, LDLCALC, TRIG, CHOLHDL, LDLDIRECT in the last 72 hours. Anemia Panel: No results for input(s): VITAMINB12, FOLATE, FERRITIN, TIBC, IRON, RETICCTPCT in the last 72 hours. Urine analysis:    Component Value Date/Time   COLORURINE YELLOW 01/11/2024 0326   APPEARANCEUR CLOUDY (A) 01/11/2024 0326   LABSPEC 1.012 01/11/2024 0326   PHURINE 8.0 01/11/2024 0326   GLUCOSEU NEGATIVE 01/11/2024 0326   HGBUR NEGATIVE 01/11/2024 0326   BILIRUBINUR NEGATIVE 01/11/2024 0326   KETONESUR NEGATIVE 01/11/2024 0326   PROTEINUR NEGATIVE 01/11/2024 0326   NITRITE NEGATIVE 01/11/2024 0326   LEUKOCYTESUR NEGATIVE 01/11/2024 0326   Sepsis Labs: Invalid input(s): PROCALCITONIN, LACTICIDVEN  Microbiology: No results found for this or any previous visit (from the  past 240 hours).  Radiology Studies: No results found.  Scheduled Meds:  dicyclomine   20 mg Oral TID AC & HS   lipase/protease/amylase  72,000 Units Oral TID AC   loratadine   10 mg Oral Daily   metoprolol  succinate  12.5 mg Oral QHS   sodium chloride  flush  3 mL Intravenous Q12H   warfarin  6 mg Oral ONCE-1600   Warfarin - Pharmacist Dosing Inpatient   Does not apply q1600   Continuous Infusions:  lactated ringers  150 mL/hr at 01/12/24 1133     LOS: 1 day   35 minutes with more than 50% spent in reviewing records, counseling patient/family and coordinating care.  Reyes VEAR Gaw, MD [facility] [email or web address] 01/12/2024, 2:48 PM

## 2024-01-12 NOTE — Progress Notes (Signed)
 [id number] Patient reported he will take CEON meds at ~0800-0830  today with clear liquid breakfast.

## 2024-01-12 NOTE — Plan of Care (Signed)

## 2024-01-12 NOTE — Progress Notes (Signed)
 PHARMACY - ANTICOAGULATION CONSULT NOTE  Pharmacy Consult for warfarin Indication: Mechanical AVR  Allergies  Allergen Reactions   Amoxicillin Hives    Patient Measurements: Height: 5' 11 (180.3 cm) Weight: 71 kg (156 lb 8.4 oz) IBW/kg (Calculated) : 75.3 HEPARIN  DW (KG): 71  Vital Signs: Temp: 98.1 F (36.7 C) (08/03 0819) Temp Source: Oral (08/03 0819) BP: 124/86 (08/03 0819) Pulse Rate: 85 (08/03 0819)  Labs: Recent Labs    01/11/24 0153 01/11/24 0155 01/12/24 0216  HGB  --  12.7* 12.7*  HCT  --  40.1 38.8*  PLT  --  302 268  LABPROT 13.7  --  13.1  INR 1.0  --  0.9  CREATININE  --  0.77 0.60*    Estimated Creatinine Clearance: 133.1 mL/min (A) (by C-G formula based on SCr of 0.6 mg/dL (L)).   Medical History: Past Medical History:  Diagnosis Date   Asthma    Headache    migraines   Heart murmur    Hypokalemia 06/08/2020   Leukocytosis 01/23/2022   Mild aortic stenosis    Pancreatitis    Pneumonia    as a child x several   Substance abuse (HCC)     Medications:  Medications Prior to Admission  Medication Sig Dispense Refill Last Dose/Taking   acetaminophen  (TYLENOL ) 500 MG tablet Take 1,000 mg by mouth every 6 (six) hours as needed for moderate pain (pain score 4-6).   01/10/2024 Bedtime   aspirin  EC 81 MG EC tablet Take 1 tablet (81 mg total) by mouth daily. Swallow whole. 30 tablet 11 01/10/2024 Morning   dicyclomine  (BENTYL ) 10 MG capsule TAKE 2 CAPSULES(20 MG) BY MOUTH FOUR TIMES DAILY BEFORE MEALS AND AT BEDTIME (Patient taking differently: Take 20 mg by mouth 4 (four) times daily -  before meals and at bedtime.) 360 capsule 1 01/10/2024 Bedtime   lipase/protease/amylase (CREON ) 36000 UNITS CPEP capsule Take 2 capsules (72,000 Units total) by mouth 3 (three) times daily before meals.   01/10/2024 Evening   loratadine  (CLARITIN ) 10 MG tablet Take 10 mg by mouth daily.   01/10/2024 Morning   methocarbamol  (ROBAXIN ) 500 MG tablet Take 1-2 tablets (500-1,000  mg total) by mouth 4 (four) times daily. (Patient taking differently: Take 500 mg by mouth daily as needed for muscle spasms.) 30 tablet 0 Past Week   metoprolol  succinate (TOPROL  XL) 25 MG 24 hr tablet Take 0.5 tablets (12.5 mg total) by mouth at bedtime. 45 tablet 2 01/10/2024 Bedtime   warfarin (COUMADIN ) 3 MG tablet TAKE 1 1/2 TABLETS TO 2 TABLETS BY MOUTH DAILY OR AS DIRECTED BY COAGULATION CLINIC (Patient taking differently: Take 4.5-6 mg by mouth See admin instructions. Take 6mg  (2 tablets) by mouth every Monday and Wednesday, and take 4.5mg  (1 and 1/2 tablet) every other day of the week.) 140 tablet 0 01/10/2024 at  8:00 PM   emtricitabine -tenofovir  (TRUVADA) 200-300 MG tablet Take 1 tablet by mouth daily. (Patient not taking: Reported on 01/11/2024)   Not Taking   Scheduled:   dicyclomine   20 mg Oral TID AC & HS   lipase/protease/amylase  72,000 Units Oral TID AC   loratadine   10 mg Oral Daily   metoprolol  succinate  12.5 mg Oral QHS   sodium chloride  flush  3 mL Intravenous Q12H   warfarin  6 mg Oral ONCE-1600   Warfarin - Pharmacist Dosing Inpatient   Does not apply q1600    Assessment: 57 you male here with abdominal pain.  He is  on warfarin for an On-X mechanical AVR placed in 09/2021. Pharmacy consulted to dose warfarin  Home dose: 4.5mg /day except take 6mg  on MW (last dose was 01/10/2024)  INR is subtherapeutic at 0.9 following a 6 mg dose yesterday. Hgb and plts are stable. Will give another 6 mg dose today.  Goal of Therapy:  INR 1.5-2.0 Monitor platelets by anticoagulation protocol: Yes   Plan:  Warfarin 6mg  po today PT/INR daily  B. Amon Rocher, PharmD PGY-1 Pharmacy Resident [facility] 01/12/2024 8:33 AM   **Pharmacist phone directory can now be found on [email or web address] ([email or web address]).  Listed under [facility].

## 2024-01-12 NOTE — Progress Notes (Addendum)
 Gastroenterology Inpatient Follow-up Note   PATIENT IDENTIFICATION  [name] [name] is a 33 y.o. male Hospital Day: 2  SUBJECTIVE  The patient's chart has been reviewed. The patient's labs have been reviewed.  Stable blood count and no elevation in LFTs. Today, the patient is seen with his aunt at bedside. He continues to complain of significant midepigastric abdominal pain after eating and states he has not moved in 3 hours to minimize pain/discomfort. The patient denies fevers or chills. He continues to corroborate that he has not had any alcohol in months. No tobacco use.   OBJECTIVE   Scheduled Inpatient Medications:   dicyclomine   20 mg Oral TID AC & HS   lipase/protease/amylase  72,000 Units Oral TID AC   loratadine   10 mg Oral Daily   metoprolol  succinate  12.5 mg Oral QHS   sodium chloride  flush  3 mL Intravenous Q12H   warfarin  6 mg Oral ONCE-1600   Warfarin - Pharmacist Dosing Inpatient   Does not apply q1600   Continuous Inpatient Infusions:   lactated ringers  150 mL/hr at 01/12/24 1133   PRN Inpatient Medications: albuterol , hydrALAZINE , HYDROmorphone  (DILAUDID ) injection, ondansetron  **OR** ondansetron  (ZOFRAN ) IV, oxyCODONE -acetaminophen    Physical Examination   Temp:  [97.5 F (36.4 C)-98.7 F (37.1 C)] 98.1 F (36.7 C) (08/03 0819) Pulse Rate:  [70-85] 85 (08/03 0819) Resp:  [16-18] 16 (08/03 0424) BP: (118-131)/(79-94) 124/86 (08/03 0819) SpO2:  [98 %-100 %] 98 % (08/03 0819) Temp (24hrs), Avg:98.2 F (36.8 C), Min:97.5 F (36.4 C), Max:98.7 F (37.1 C)  Weight: 71 kg GEN: NAD, appears stated age, doesn't appear chronically ill PSYCH: Cooperative, without pressured speech EYE: Conjunctivae pink, sclerae anicteric ENT: MMM CV: Nontachycardic RESP: No audible wheezing GI: NABS, soft, generalized tenderness to palpation throughout the abdomen even upon light palpation, volitional guarding present, no rebound  MSK/EXT: No  significant lower extremity edema SKIN: No jaundice NEURO:  Alert & Oriented x 3, no focal deficits   Review of Data   Laboratory Studies   Recent Labs  Lab 01/12/24 0216  NA 137  K 4.2  CL 101  CO2 27  BUN 6  CREATININE 0.60*  GLUCOSE 119*  CALCIUM  9.0   Recent Labs  Lab 01/12/24 0216  AST 18  ALT 20  ALKPHOS 64    Recent Labs  Lab 01/11/24 0155 01/12/24 0216  WBC 12.3* 9.7  HGB 12.7* 12.7*  HCT 40.1 38.8*  PLT 302 268   Recent Labs  Lab 01/11/24 0153 01/12/24 0216  INR 1.0 0.9   Imaging Studies   CT ABDOMEN PELVIS W CONTRAST Result Date: 01/11/2024 CLINICAL DATA:  33 year old male with severe abdominal pain since 2100 hours. Pain radiating to the back with nausea. History of complicated pancreatitis. Abnormal CT Abdomen and Pelvis in June. EXAM: CT ABDOMEN AND PELVIS WITH CONTRAST TECHNIQUE: Multidetector CT imaging of the abdomen and pelvis was performed using the standard protocol following bolus administration of intravenous contrast. RADIATION DOSE REDUCTION: This exam was performed according to the departmental dose-optimization program which includes automated exposure control, adjustment of the mA and/or kV according to patient size and/or use of iterative reconstruction technique. CONTRAST:  75mL OMNIPAQUE  IOHEXOL  350 MG/ML SOLN COMPARISON:  CT Abdomen and Pelvis 12/07/2023 and earlier. FINDINGS: Lower chest: Previous sternotomy. Partially visible prosthetic heart valve. Normal heart size. No pericardial or pleural effusion. Mild dependent atelectasis. Hepatobiliary: Small volume perihepatic ascites has not significantly changed. Inflammation at the porta hepatis not significantly changed.  Gallbladder only mildly affected. Liver enhancement remains normal. No bile duct enlargement. Pancreas: Severely abnormal. Severe chronic dilatation of the main pancreatic duct with atrophied pancreatic head, anterior body. Coarse dystrophic calcifications throughout the  pancreatic head and uncinate. Partially atrophied pancreatic tail. Confluent surrounding peripancreatic inflammation especially to the right of midline and severely affecting the distal stomach and duodenal bulb as before. However, both an hourglass shaped 3 cm low-density pseudocyst and rounded 5 cm complicated pseudocyst or peripancreatic hematoma have regressed since 12/07/2023 (small residual on series 3, images 34 and 39, respectively). No progressive pancreatic collection. No new pancreatic necrosis identified. Spleen: Stable and negative. Adrenals/Urinary Tract: Stable and negative. Stomach/Bowel: Nondilated large and small bowel throughout the abdomen and pelvis. However, confluent inflammation throughout the distal stomach, proximal duodenum (coronal images 96 through 109), ongoing since June and severe. Second portion of the duodenum appears less inflamed, nondilated. Moderate inflammation throughout the hepatic flexure which abuts the abnormal 1st portion of the duodenum (also image 96, series 3, image 40. Proximal stomach mildly distended with gas and fluid. And there is a small juxta cardiac gastric diverticulum incidentally noted on series 3, image 11, stable. Inflamed distal stomach and proximal duodenum decompressed. Inflamed hepatic flexure decompressed. Upstream non dilated right:. Retrocecal appendix nondilated. No pneumoperitoneum identified. Small volume of simple density free fluid in the right upper quadrant and in the right gutter (series 3, images 28 and 40). Vascular/Lymphatic: Portal venous system remains patent although with chronic mass effect or narrowing on the superior mesenteric vein at the pancreas (series 3, image 33), unchanged. Splenic vein remains patent. Major arterial structures in the abdomen and pelvis remain patent. Normal caliber abdominal aorta. No lymphadenopathy identified. Reproductive: Negative. Other: Small volume pelvic ascites with simple fluid density has not  significantly changed from June (series 3, image 83). Musculoskeletal: Chronic sternotomy. Maintained vertebral height. No acute osseous abnormality identified. IMPRESSION: 1. Severe sequelae of advanced Pancreatitis, with pronounced and confluent ongoing inflammation of the distal stomach, the proximal duodenum, and the adjacent hepatic flexure of colon. But 3 cm pancreatic pseudocyst and 5 cm peripancreatic complex cyst or hematoma have both regressed since June. Small volume ascites in the right upper quadrant, the right gutter, and the pelvis not significantly changed. And stable narrowing of the SMV which remains patent. No new or progressed complicating features identified since June. 2. Chronic sternotomy.  Minor lung base atelectasis. Electronically Signed   By: VEAR Hurst M.D.   On: 01/11/2024 04:39   GI Procedures and Studies  No new GI procedures to review   ASSESSMENT  Mr. Nydam is a 33 y.o. male with a pmh significant for mechanical AVR (on anticoagulation), hypertension, chronic recurrent pancreatitis (with pseudocyst previously noted).  Patient admitted to the hospital with recurrent abdominal pain and imaging and labs suggestive of acute on chronic pancreatitis with persisting pancreatic ductal dilation.  The patient is hemodynamically stable.  Clinically, appears stable but still having significant abdominal pain in the epigastrium.  Review of his imaging compared to his prior imaging a few months ago suggest that the pancreas duct remains dilated.  At the time of my EUS, I considered the potential role of whether he could be a candidate for pancreatic ERCP to try to traverse the narrowed pancreatic head and try to traverse the pancreatic ductal stone that is in the duct causing obstruction.  Doing this could help potentially decrease risk of recurring bouts of pancreatitis if we are able to traverse the stone.  Not  clear that he will be able to be broken down.  His pancreatic ductal anatomy,  also may be possible for him to be a candidate for [place] in the future from a surgical standpoint if necessary.  Now is not the time to be trying to do pancreatic work in the setting of his admission for pancreatitis, but the patient is amenable to considering moving forward with pancreatic ERCP attempt.  The risks of an ERCP were discussed at length, including but not limited to the risk of perforation, bleeding, abdominal pain, post-ERCP pancreatitis (while usually mild can be severe and even life threatening).  Celiac block may be required if he continues to have aggressive pain that cannot be treated with oral/IV pain medications.  I will have my team work on trying to find an outpatient ERCP slot for him.  Inpatient GI team will continue to monitor.  They will update me if there are issues or we may need to consider celiac block.  If patient does not progress in the course of the next 24 to 36 hours, he may need enteral nutrition via core track feeding.  All patient questions were answered to the best of my ability, and the patient agrees to the aforementioned plan of action with follow-up as indicated.   PLAN/RECOMMENDATIONS  Continue IV fluid maintenance Advance diet as tolerated If not able to tolerate/advance his diet significantly in the next 24 hours, core track will need to be placed Repeat inflammatory markers ordered for tomorrow Celiac block may be required in future if unable to improve pain threshold I will have my team begin working on trying to find a outpatient ERCP pancreas slot for stenting attempt Pain medication regimen as per medical service   Dr. Albertus will resume service of the inpatient GI team [facility].  Please page/call with questions or concerns.   Aloha Finner, MD [facility] Office # [phone]    LOS: 1 day  Aloha Finner Raddle  01/12/2024, 1:57 PM Total time required for review of imaging and discussion with patient and  family and coordination of care with hope for outpatient care greater than 50 minutes.

## 2024-01-13 ENCOUNTER — Telehealth: Payer: Self-pay

## 2024-01-13 DIAGNOSIS — R112 Nausea with vomiting, unspecified: Secondary | ICD-10-CM | POA: Diagnosis not present

## 2024-01-13 DIAGNOSIS — K859 Acute pancreatitis without necrosis or infection, unspecified: Secondary | ICD-10-CM | POA: Diagnosis not present

## 2024-01-13 DIAGNOSIS — F1011 Alcohol abuse, in remission: Secondary | ICD-10-CM | POA: Diagnosis not present

## 2024-01-13 DIAGNOSIS — K861 Other chronic pancreatitis: Secondary | ICD-10-CM | POA: Diagnosis not present

## 2024-01-13 DIAGNOSIS — R1110 Vomiting, unspecified: Secondary | ICD-10-CM

## 2024-01-13 DIAGNOSIS — K8681 Exocrine pancreatic insufficiency: Secondary | ICD-10-CM | POA: Diagnosis not present

## 2024-01-13 DIAGNOSIS — R1013 Epigastric pain: Secondary | ICD-10-CM

## 2024-01-13 LAB — PROTIME-INR
INR: 2.2 — ABNORMAL HIGH (ref 0.8–1.2)
Prothrombin Time: 25.9 s — ABNORMAL HIGH (ref 11.4–15.2)

## 2024-01-13 LAB — GENESEQ PLUS, CFTR

## 2024-01-13 MED ORDER — WARFARIN SODIUM 4 MG PO TABS
4.0000 mg | ORAL_TABLET | Freq: Once | ORAL | Status: AC
  Administered 2024-01-13: 4 mg via ORAL
  Filled 2024-01-13: qty 1

## 2024-01-13 NOTE — Telephone Encounter (Signed)
-----   Message from [facility] sent at 01/12/2024  1:58 PM EDT ----- Regarding: Pancreatic ERCP attempt Alilah Mcmeans, This patient needs to be scheduled with me for an ERCP pancreatic stenting attempt for PD stone and chronic pancreatitis. Please schedule as able. He will remain in inpatient for a while, so can update the inpatient team when he is scheduled. He does not need an updated clinic appointment with me as I have been able to talk with him about the procedure during his hospitalization. Thanks. GM

## 2024-01-13 NOTE — Progress Notes (Signed)
 Daily Progress Note  DOA: 01/11/2024 Hospital Day: 3   Patient Profile:   33 year old male with a past medical history including but not limited to chronic pancreatitis complicated by pseudocyst, hypertension, mechanical valve replacement 2023 on chronic warfarin.  Patient admitted with abdominal pain, acute on chronic pancreatitis.   ASSESSMENT    Abdominal pain /  acute on chronic pancreatitis Lipase 338 Prior Etoh abuse ( in remission)  CT AP showing severe sequelae of advanced pancreatitis, with pronounced and confluent ongoing inflammation of the distal stomach, the proximal duodenum, and the adjacent hepatic flexure of colon. A 3 cm and 5 cm peripancreatic complex cyst or hematoma have both regressed since June. No new or progressed complicating features identified since June. TODAY>> feels much better than yesterday.   History of aortic valve replacement  On chronic warfarin   Principal Problem:   Chronic recurrent pancreatitis (HCC) Active Problems:   S/P AVR (aortic valve replacement) and aortoplasty   Subtherapeutic international normalized ratio (INR)   Essential hypertension   Pseudocyst of pancreas   On pre-exposure prophylaxis for HIV   History of asthma   History of alcohol abuse   PLAN   --If continues to improve will possibly advance diet for dinner --Resume home [place]  when eating --Dr. Wilhelmenia is arranging for outpatient ERCP with pancreatic stenting   Subjective   Had abdominal pain this am - relieved with pain meds.  Tolerating CLD, interested in advancing diet.   Objective   GI Studies:    Recent Labs    01/11/24 0155 01/12/24 0216  WBC 12.3* 9.7  HGB 12.7* 12.7*  HCT 40.1 38.8*  MCV 90.5 88.2  PLT 302 268   No results for input(s): FOLATE, VITAMINB12, FERRITIN, TIBC, IRONPCTSAT in the last 72 hours. Recent Labs    01/11/24 0155 01/12/24 0216  NA 138 137  K 4.0 4.2  CL 101 101  CO2 25 27  GLUCOSE 132* 119*   BUN 9 6  CREATININE 0.77 0.60*  CALCIUM  9.3 9.0   Recent Labs    01/11/24 0155 01/12/24 0216  PROT 7.3 6.5  ALBUMIN  4.3 3.6  AST 27 18  ALT 25 20  ALKPHOS 73 64  BILITOT <0.2 0.6     Imaging:  CT ABDOMEN PELVIS W CONTRAST CLINICAL DATA:  33 year old male with severe abdominal pain since 2100 hours. Pain radiating to the back with nausea. History of complicated pancreatitis. Abnormal CT Abdomen and Pelvis in June.  EXAM: CT ABDOMEN AND PELVIS WITH CONTRAST  TECHNIQUE: Multidetector CT imaging of the abdomen and pelvis was performed using the standard protocol following bolus administration of intravenous contrast.  RADIATION DOSE REDUCTION: This exam was performed according to the departmental dose-optimization program which includes automated exposure control, adjustment of the mA and/or kV according to patient size and/or use of iterative reconstruction technique.  CONTRAST:  75mL OMNIPAQUE  IOHEXOL  350 MG/ML SOLN  COMPARISON:  CT Abdomen and Pelvis 12/07/2023 and earlier.  FINDINGS: Lower chest: Previous sternotomy. Partially visible prosthetic heart valve. Normal heart size. No pericardial or pleural effusion. Mild dependent atelectasis.  Hepatobiliary: Small volume perihepatic ascites has not significantly changed. Inflammation at the porta hepatis not significantly changed. Gallbladder only mildly affected. Liver enhancement remains normal. No bile duct enlargement.  Pancreas: Severely abnormal.  Severe chronic dilatation of the main pancreatic duct with atrophied pancreatic head, anterior body. Coarse dystrophic calcifications throughout the pancreatic head and uncinate. Partially atrophied pancreatic tail.  Confluent surrounding peripancreatic inflammation especially  to the right of midline and severely affecting the distal stomach and duodenal bulb as before.  However, both an hourglass shaped 3 cm low-density pseudocyst and rounded 5 cm  complicated pseudocyst or peripancreatic hematoma have regressed since 12/07/2023 (small residual on series 3, images 34 and 39, respectively).  No progressive pancreatic collection. No new pancreatic necrosis identified.  Spleen: Stable and negative.  Adrenals/Urinary Tract: Stable and negative.  Stomach/Bowel: Nondilated large and small bowel throughout the abdomen and pelvis.  However, confluent inflammation throughout the distal stomach, proximal duodenum (coronal images 96 through 109), ongoing since June and severe. Second portion of the duodenum appears less inflamed, nondilated. Moderate inflammation throughout the hepatic flexure which abuts the abnormal 1st portion of the duodenum (also image 96, series 3, image 40.  Proximal stomach mildly distended with gas and fluid. And there is a small juxta cardiac gastric diverticulum incidentally noted on series 3, image 11, stable. Inflamed distal stomach and proximal duodenum decompressed. Inflamed hepatic flexure decompressed. Upstream non dilated right:. Retrocecal appendix nondilated. No pneumoperitoneum identified. Small volume of simple density free fluid in the right upper quadrant and in the right gutter (series 3, images 28 and 40).  Vascular/Lymphatic: Portal venous system remains patent although with chronic mass effect or narrowing on the superior mesenteric vein at the pancreas (series 3, image 33), unchanged. Splenic vein remains patent. Major arterial structures in the abdomen and pelvis remain patent. Normal caliber abdominal aorta.  No lymphadenopathy identified.  Reproductive: Negative.  Other: Small volume pelvic ascites with simple fluid density has not significantly changed from June (series 3, image 83).  Musculoskeletal: Chronic sternotomy. Maintained vertebral height. No acute osseous abnormality identified.  IMPRESSION: 1. Severe sequelae of advanced Pancreatitis, with pronounced  and confluent ongoing inflammation of the distal stomach, the proximal duodenum, and the adjacent hepatic flexure of colon. But 3 cm pancreatic pseudocyst and 5 cm peripancreatic complex cyst or hematoma have both regressed since June. Small volume ascites in the right upper quadrant, the right gutter, and the pelvis not significantly changed. And stable narrowing of the SMV which remains patent. No new or progressed complicating features identified since June.  2. Chronic sternotomy.  Minor lung base atelectasis.  Electronically Signed   By: VEAR Hurst M.D.   On: 01/11/2024 04:39     Scheduled inpatient medications:   dicyclomine   20 mg Oral TID AC & HS   lipase/protease/amylase  72,000 Units Oral TID AC   loratadine   10 mg Oral Daily   metoprolol  succinate  12.5 mg Oral QHS   sodium chloride  flush  3 mL Intravenous Q12H   warfarin  4 mg Oral ONCE-1600   Warfarin - Pharmacist Dosing Inpatient   Does not apply q1600   Continuous inpatient infusions:  PRN inpatient medications: albuterol , hydrALAZINE , HYDROmorphone  (DILAUDID ) injection, ondansetron  **OR** ondansetron  (ZOFRAN ) IV, oxyCODONE -acetaminophen   Vital signs in last 24 hours: Temp:  [97.5 F (36.4 C)-98.4 F (36.9 C)] 97.5 F (36.4 C) (08/04 0732) Pulse Rate:  [71-86] 86 (08/04 0732) Resp:  [16-19] 19 (08/04 0732) BP: (105-141)/(70-88) 105/70 (08/04 0732) SpO2:  [96 %-99 %] 99 % (08/04 0732) Last BM Date : 01/10/24  Intake/Output Summary (Last 24 hours) at 01/13/2024 1121 Last data filed at 01/12/2024 1633 Gross per 24 hour  Intake 100 ml  Output --  Net 100 ml    Intake/Output from previous day: 08/03 0701 - 08/04 0700 In: 100 [P.O.:100] Out: -  Intake/Output this shift: No intake/output data recorded.  Physical Exam:  General: Alert male in NAD Heart:  Regular rate and rhythm.  Pulmonary: Normal respiratory effort Abdomen: Soft, nondistended, mild mid upper abdominal tenderness.  Normal bowel  sounds. Extremities: No lower extremity edema  Neurologic: Alert and oriented Psych: Pleasant. Cooperative     LOS: 2 days   Vina Dasen ,NP 01/13/2024, 11:21 AM

## 2024-01-13 NOTE — Plan of Care (Signed)
   Problem: Activity: Goal: Risk for activity intolerance will decrease Outcome: Progressing   Problem: Nutrition: Goal: Adequate nutrition will be maintained Outcome: Progressing   Problem: Pain Managment: Goal: General experience of comfort will improve and/or be controlled Outcome: Progressing

## 2024-01-13 NOTE — Progress Notes (Signed)
 PROGRESS NOTE  [name] [name]  FMW:[id number] DOB: Mar 28, 1991 DOA: 01/11/2024 PCP: Lucius Krabbe, NP  Consultants  Brief Narrative: [name] [name] is a 33 y.o. male with medical history significant of hypertension, bicuspid aortic valve with severe aortic insufficiency and moderate aortic stenosis s/p mechanical aortic valve replacement in arthroplasty on 09/27/2021 on chronic anticoagulation, recurrent pancreatitis, asthma, GERD, and prior history of alcohol abuse presents with abdominal pain.  Presenting to emergency department with 1 day of severe abdominal pain night prior to admission.  Described as more intense than usual and in left upper quadrant extending to epigastrium.  Known history of pancreatic pseudocyst evaluated with endoscopy in April of this year.  Multiple hospitalizations for pancreatitis.  Most recent in June of this year treated with IV analgesics and hydration.  Assessment & Plan: Recurrent pancreatitis with pseudocyst - Pain better today than yesterday.  He was able to tolerate p.o. today whereas he was not yesterday. - Moving to full diet for lunch -GI has seen patient.  There is question of possible functional gastric outlet obstruction due to acute on chronic pancreatitis, at which point he would need postpyloric feeding. - Continue with IV pain control, antiemetics, p.o. narcotics.   History of bicuspid aortic valve with severe aortic insufficiency and moderate aortic stenosis s/p mechanical aortic valve replacement with arthroplasty Subtherapeutic INR INR noted to be 1.  Goal is 1.5-2 on Coumadin  and aspirin . -  Coumadin  per pharmacy   Essential hypertension Blood pressures currently 121/72-150/86 - Continue Metroprolol with holding parameters for bradycardia   History of asthma No wheezing appreciated on physical exam. - Albuterol  nebs as needed for shortness of breath/wheezing   On preexposure prophylaxis for HIV - Patient  had been on Truvada for prophylaxis but stopped about a week and a half ago due to his symptoms.  Last HIV test nonreactive 04/03/2023. -Resume on discharge   History of alcohol abuse - Patient reports being sober for the last 10 months.  Normocytic anemia: - Likely secondary to his chronic pancreatitis - Ongoing for past several years.  Will follow.    DVT prophylaxis:   warfarin (COUMADIN ) tablet 4 mg  Code Status:   Code Status: Full Code Level of care: Telemetry Medical Status is: Inpatient   Consults called: Gastroenterology  Subjective: Pain much better today than yesterday.  Describes as 4/10.  Yesterday was close to 9.  Had vomiting 12 hours after eating breakfast.  No nausea this morning.  Would like to try full liquids at lunch.  Objective: Vitals:   01/12/24 2002 01/13/24 0019 01/13/24 0605 01/13/24 0732  BP: (!) 141/87 117/78 111/75 105/70  Pulse: 80 71 82 86  Resp: 16 18 18 19   Temp: 98.4 F (36.9 C) 97.9 F (36.6 C) 98 F (36.7 C) (!) 97.5 F (36.4 C)  TempSrc: Oral   Oral  SpO2: 96% 96% 99% 99%  Weight:      Height:        Intake/Output Summary (Last 24 hours) at 01/13/2024 1405 Last data filed at 01/12/2024 1633 Gross per 24 hour  Intake 0 ml  Output --  Net 0 ml   Filed Weights   01/11/24 0138  Weight: 71 kg   Body mass index is 21.83 kg/m.  Gen: 33 y.o. male in no apparent distress.  Nontoxic.  Able to sit up easily move around the bed Pulm: Non-labored breathing.  Clear to auscultation bilaterally.  CV: Regular rate and rhythm. GI: Abdomen soft, nondistended.  Continued epigastric tenderness without guarding or rebound. Ext: Warm, no deformities, no pedal edema Skin: No rashes, lesions  Neuro: Alert and oriented. No focal neurological deficits. Psych: Calm  Judgement and insight appear normal. Mood & affect appropriate.     I have personally reviewed the following labs and images: CBC: Recent Labs  Lab 01/11/24 0155 01/12/24 0216   WBC 12.3* 9.7  HGB 12.7* 12.7*  HCT 40.1 38.8*  MCV 90.5 88.2  PLT 302 268   BMP &GFR Recent Labs  Lab 01/11/24 0155 01/12/24 0216  NA 138 137  K 4.0 4.2  CL 101 101  CO2 25 27  GLUCOSE 132* 119*  BUN 9 6  CREATININE 0.77 0.60*  CALCIUM  9.3 9.0   Estimated Creatinine Clearance: 133.1 mL/min (A) (by C-G formula based on SCr of 0.6 mg/dL (L)). Liver & Pancreas: Recent Labs  Lab 01/11/24 0155 01/12/24 0216  AST 27 18  ALT 25 20  ALKPHOS 73 64  BILITOT <0.2 0.6  PROT 7.3 6.5  ALBUMIN  4.3 3.6   Recent Labs  Lab 01/11/24 0155  LIPASE 338*   No results for input(s): AMMONIA in the last 168 hours. Diabetic: No results for input(s): HGBA1C in the last 72 hours. No results for input(s): GLUCAP in the last 168 hours. Cardiac Enzymes: No results for input(s): CKTOTAL, CKMB, CKMBINDEX, TROPONINI in the last 168 hours. No results for input(s): PROBNP in the last 8760 hours. Coagulation Profile: Recent Labs  Lab 01/11/24 0153 01/12/24 0216 01/13/24 0438  INR 1.0 0.9 2.2*   Thyroid Function Tests: No results for input(s): TSH, T4TOTAL, FREET4, T3FREE, THYROIDAB in the last 72 hours. Lipid Profile: No results for input(s): CHOL, HDL, LDLCALC, TRIG, CHOLHDL, LDLDIRECT in the last 72 hours. Anemia Panel: No results for input(s): VITAMINB12, FOLATE, FERRITIN, TIBC, IRON, RETICCTPCT in the last 72 hours. Urine analysis:    Component Value Date/Time   COLORURINE YELLOW 01/11/2024 0326   APPEARANCEUR CLOUDY (A) 01/11/2024 0326   LABSPEC 1.012 01/11/2024 0326   PHURINE 8.0 01/11/2024 0326   GLUCOSEU NEGATIVE 01/11/2024 0326   HGBUR NEGATIVE 01/11/2024 0326   BILIRUBINUR NEGATIVE 01/11/2024 0326   KETONESUR NEGATIVE 01/11/2024 0326   PROTEINUR NEGATIVE 01/11/2024 0326   NITRITE NEGATIVE 01/11/2024 0326   LEUKOCYTESUR NEGATIVE 01/11/2024 0326   Sepsis Labs: Invalid input(s): PROCALCITONIN,  LACTICIDVEN  Microbiology: No results found for this or any previous visit (from the past 240 hours).  Radiology Studies: No results found.  Scheduled Meds:  dicyclomine   20 mg Oral TID AC & HS   lipase/protease/amylase  72,000 Units Oral TID AC   loratadine   10 mg Oral Daily   metoprolol  succinate  12.5 mg Oral QHS   sodium chloride  flush  3 mL Intravenous Q12H   warfarin  4 mg Oral ONCE-1600   Warfarin - Pharmacist Dosing Inpatient   Does not apply q1600   Continuous Infusions:     LOS: 2 days   35 minutes with more than 50% spent in reviewing records, counseling patient/family and coordinating care.  Reyes VEAR Gaw, MD [facility] [email or web address] 01/13/2024, 2:05 PM

## 2024-01-13 NOTE — Plan of Care (Signed)

## 2024-01-13 NOTE — Progress Notes (Signed)
 PHARMACY - ANTICOAGULATION CONSULT NOTE  Pharmacy Consult for warfarin Indication: Mechanical AVR  Allergies  Allergen Reactions   Amoxicillin Hives   Patient Measurements: Height: 5' 11 (180.3 cm) Weight: 71 kg (156 lb 8.4 oz) IBW/kg (Calculated) : 75.3 HEPARIN  DW (KG): 71  Vital Signs: Temp: 98 F (36.7 C) (08/04 0605) Temp Source: Oral (08/03 2002) BP: 111/75 (08/04 0605) Pulse Rate: 82 (08/04 0605)  Labs: Recent Labs    01/11/24 0153 01/11/24 0155 01/12/24 0216 01/13/24 0438  HGB  --  12.7* 12.7*  --   HCT  --  40.1 38.8*  --   PLT  --  302 268  --   LABPROT 13.7  --  13.1 25.9*  INR 1.0  --  0.9 2.2*  CREATININE  --  0.77 0.60*  --     Estimated Creatinine Clearance: 133.1 mL/min (A) (by C-G formula based on SCr of 0.6 mg/dL (L)).   Medical History: Past Medical History:  Diagnosis Date   Asthma    Headache    migraines   Heart murmur    Hypokalemia 06/08/2020   Leukocytosis 01/23/2022   Mild aortic stenosis    Pancreatitis    Pneumonia    as a child x several   Substance abuse [facility])    Assessment: 36 you male presents with abdominal pain found to have pancreatitis. He is on warfarin for an On-X mechanical AVR placed in 09/2021. Prior to admission, on warfarin for 4.5mg  everyday except 6mg  on MW (last dose 01/10/2024). Pharmacy consulted to dose warfarin.  Date INR Warfarin Dose  8/2 1.0, subtherapeutic 6mg    8/3 0.9, subtherapeutic  6mg    8/4 2.2, supratherapeutic     CBC stable and no overt s/sx of bleeding.  Drug-Drug interactions: None noted   Goal of Therapy:  INR 1.5-2.0 Monitor platelets by anticoagulation protocol: Yes   Plan:  Warfarin 4mg  po today Daily INR, CBC, and monitoring for bleeding F/u new drug-drug interactions   Thank you for allowing pharmacy to participate in this patient's care.  Leonor GORMAN Bash, PharmD Emergency Medicine Clinical Pharmacist 01/13/2024,7:29 AM

## 2024-01-14 ENCOUNTER — Other Ambulatory Visit: Payer: Self-pay | Admitting: Physician Assistant

## 2024-01-14 DIAGNOSIS — K859 Acute pancreatitis without necrosis or infection, unspecified: Secondary | ICD-10-CM | POA: Diagnosis not present

## 2024-01-14 DIAGNOSIS — K861 Other chronic pancreatitis: Secondary | ICD-10-CM | POA: Diagnosis not present

## 2024-01-14 DIAGNOSIS — K8681 Exocrine pancreatic insufficiency: Secondary | ICD-10-CM | POA: Diagnosis not present

## 2024-01-14 DIAGNOSIS — E440 Moderate protein-calorie malnutrition: Secondary | ICD-10-CM

## 2024-01-14 DIAGNOSIS — F1011 Alcohol abuse, in remission: Secondary | ICD-10-CM | POA: Diagnosis not present

## 2024-01-14 LAB — PROTIME-INR
INR: 2.3 — ABNORMAL HIGH (ref 0.8–1.2)
Prothrombin Time: 26.5 s — ABNORMAL HIGH (ref 11.4–15.2)

## 2024-01-14 LAB — CBC
HCT: 33.7 % — ABNORMAL LOW (ref 39.0–52.0)
Hemoglobin: 11 g/dL — ABNORMAL LOW (ref 13.0–17.0)
MCH: 29.3 pg (ref 26.0–34.0)
MCHC: 32.6 g/dL (ref 30.0–36.0)
MCV: 89.6 fL (ref 80.0–100.0)
Platelets: 236 K/uL (ref 150–400)
RBC: 3.76 MIL/uL — ABNORMAL LOW (ref 4.22–5.81)
RDW: 14.4 % (ref 11.5–15.5)
WBC: 9.2 K/uL (ref 4.0–10.5)
nRBC: 0 % (ref 0.0–0.2)

## 2024-01-14 LAB — COMPREHENSIVE METABOLIC PANEL WITH GFR
ALT: 15 U/L (ref 0–44)
AST: 15 U/L (ref 15–41)
Albumin: 3.1 g/dL — ABNORMAL LOW (ref 3.5–5.0)
Alkaline Phosphatase: 57 U/L (ref 38–126)
Anion gap: 8 (ref 5–15)
BUN: 8 mg/dL (ref 6–20)
CO2: 30 mmol/L (ref 22–32)
Calcium: 8.8 mg/dL — ABNORMAL LOW (ref 8.9–10.3)
Chloride: 101 mmol/L (ref 98–111)
Creatinine, Ser: 0.65 mg/dL (ref 0.61–1.24)
GFR, Estimated: >60 mL/min (ref >60)
Glucose, Bld: 117 mg/dL — ABNORMAL HIGH (ref 70–99)
Potassium: 3.9 mmol/L (ref 3.5–5.1)
Sodium: 139 mmol/L (ref 135–145)
Total Bilirubin: 0.4 mg/dL (ref 0.0–1.2)
Total Protein: 6.2 g/dL — ABNORMAL LOW (ref 6.5–8.1)

## 2024-01-14 MED ORDER — WARFARIN SODIUM 4 MG PO TABS
4.0000 mg | ORAL_TABLET | Freq: Once | ORAL | Status: AC
  Administered 2024-01-14: 4 mg via ORAL
  Filled 2024-01-14: qty 1

## 2024-01-14 MED ORDER — LORAZEPAM 2 MG/ML IJ SOLN: 1.0000 mg | Freq: Once | INTRAMUSCULAR | Status: DC

## 2024-01-14 NOTE — Progress Notes (Signed)
 PROGRESS NOTE [name] [name]  FMW:[id number] DOB: Nov 29, 1990 DOA: 01/11/2024 PCP: Lucius Krabbe, NP  Brief Narrative/Hospital Course: 33 y.o. male with medical history significant of hypertension, bicuspid aortic valve with severe aortic insufficiency and moderate aortic stenosis s/p mechanical AV replacement on 09/27/2021 on chronic anticoagulation, recurrent pancreatitis, asthma, GERD, and prior history of alcohol abuse presents with abdominal pain found to have AKI pancreatitis with pseudocyst, GI was consulted and admitted for conservative management. Patient had multiple hospitalization for pancreatitis.  Subjective: Seen and examined today Some vomiting last night but ate this morning doing well so far.   Overnight afebrile BP stable Labs reviewed this morning mild anemia otherwise stable  Assessment and plan:  Acute on chronic pancreatitis in the setting of alcohol abuse now in remission Pseudocyst: GI following, input appreciated, continue to augment diet as tolerated, continue antiemetics,bentyl , panreastic enzymes, ivf, pain meds May need to consider postpyloric feeding if he has a functional gastric outlet obstruction due to acute on chronic pancreatitis But he seems to be tolerating diet SOME- although he did have episode of vomiting last night few hours after eating he is nervous about that today. Await GI input for advance diet  S/P mechanical aortic valve replacement with arthroplasty: Had bicuspid aortic valve with severe AI and moderate AS.  Continued his Coumadin  per pharmacy for INR goal  Recent Labs  Lab 01/11/24 0153 01/12/24 0216 01/13/24 0438 01/14/24 0426  INR 1.0 0.9 2.2* 2.3*     Essential hypertension Well-controlled on metoprolol    Asthma Not wheezing. Cont  prn nebs   On preexposure prophylaxis for HIV Patient had been on Truvada for prophylaxis but stopped about a week and a half ago due to his symptoms. Last HIV test nonreactive  04/03/2023. Resume on discharge   History of alcohol abuse now in remission x 10 months: Encourage    Normocytic anemia: Likely secondary to his chronic pancreatitis  Mobility: Ambulatory at baseline  DVT prophylaxis: Coumadin  Code Status:   Code Status: Full Code Family Communication: plan of care discussed with patient at bedside. Patient status is: Remains hospitalized because of severity of illness Level of care: Telemetry Medical   Dispo: The patient is from: HOME            Anticipated disposition: TBD Objective: Vitals last 24 hrs: Vitals:   01/13/24 1940 01/14/24 0014 01/14/24 0415 01/14/24 0747  BP: 129/79 (!) 140/79 111/70 104/67  Pulse: 83 73 68 69  Resp: 17 18 18 19   Temp: 98.3 F (36.8 C) 98.2 F (36.8 C) 98.4 F (36.9 C) 98.2 F (36.8 C)  TempSrc: Oral   Oral  SpO2: 99% 100% 99% 98%  Weight:      Height:        Physical Examination: General exam: alert awake, thin HEENT:Oral mucosa moist, Ear/Nose WNL grossly Respiratory system: Bilaterally clear BS,no use of accessory muscle, chest tube in place Cardiovascular system: S1 & S2 +, No JVD. Gastrointestinal system: Abdomen soft, mildly tender,ND, BS+ Nervous System: Alert, awake, moving all extremities,and following commands. Extremities: LE edema neg, distal extremities warm.  Skin: No rashes,no icterus. MSK: Normal muscle bulk,tone, power   Medications reviewed:  Scheduled Meds:  dicyclomine   20 mg Oral TID AC & HS   lipase/protease/amylase  72,000 Units Oral TID AC   loratadine   10 mg Oral Daily   metoprolol  succinate  12.5 mg Oral QHS   sodium chloride  flush  3 mL Intravenous Q12H   warfarin  4 mg Oral  ONCE-1600   Warfarin - Pharmacist Dosing Inpatient   Does not apply q1600   Continuous Infusions: Diet: Diet Order             Diet full liquid Room service appropriate? Yes; Fluid consistency: Thin  Diet effective now                    Data Reviewed: I have personally reviewed  following labs and imaging studies ( see epic result tab) CBC: Recent Labs  Lab 01/11/24 0155 01/12/24 0216 01/14/24 0426  WBC 12.3* 9.7 9.2  HGB 12.7* 12.7* 11.0*  HCT 40.1 38.8* 33.7*  MCV 90.5 88.2 89.6  PLT 302 268 236   CMP: Recent Labs  Lab 01/11/24 0155 01/12/24 0216 01/14/24 0426  NA 138 137 139  K 4.0 4.2 3.9  CL 101 101 101  CO2 25 27 30   GLUCOSE 132* 119* 117*  BUN 9 6 8   CREATININE 0.77 0.60* 0.65  CALCIUM  9.3 9.0 8.8*   GFR: Estimated Creatinine Clearance: 133.1 mL/min (by C-G formula based on SCr of 0.65 mg/dL). Recent Labs  Lab 01/11/24 0155 01/12/24 0216 01/14/24 0426  AST 27 18 15   ALT 25 20 15   ALKPHOS 73 64 57  BILITOT <0.2 0.6 0.4  PROT 7.3 6.5 6.2*  ALBUMIN  4.3 3.6 3.1*    Recent Labs  Lab 01/11/24 0155  LIPASE 338*   No results for input(s): AMMONIA in the last 168 hours. Coagulation Profile:  Recent Labs  Lab 01/11/24 0153 01/12/24 0216 01/13/24 0438 01/14/24 0426  INR 1.0 0.9 2.2* 2.3*   Unresulted Labs (From admission, onward)     Start     Ordered   01/12/24 0500  Protime-INR  Daily,   R      01/11/24 9072           Antimicrobials/Microbiology: Anti-infectives (From admission, onward)    None      No results found for: SDES, SPECREQUEST, CULT, REPTSTATUS  Procedures:  Mennie LAMY, MD [facility] 01/14/2024, 11:32 AM

## 2024-01-14 NOTE — Progress Notes (Signed)
 Daily Progress Note  DOA: 01/11/2024 Hospital Day: 4   Patient Profile:   33 year old male with a past medical history including but not limited to chronic pancreatitis complicated by pseudocyst, hypertension, mechanical valve replacement 2023 on chronic warfarin.  Patient admitted with abdominal pain, acute on chronic pancreatitis    ASSESSMENT    Abdominal pain /  acute on chronic pancreatitis Prior Etoh abuse ( in remission) . Unable to consistently hold down clear liquids. CT scan on admission showed associated severe gastroduodenitis with some distention of the stomach so must have outlet obstruction to some degree.  TODAY>> Around 9 pm last night he vomited dinner meal( mainly clear liquids). Hasn't vomited yet today and wants to advance diet.     History of aortic valve replacement  On chronic warfarin  Principal Problem:   Chronic recurrent pancreatitis (HCC) Active Problems:   S/P AVR (aortic valve replacement) and aortoplasty   Subtherapeutic international normalized ratio (INR)   Essential hypertension   Pseudocyst of pancreas   On pre-exposure prophylaxis for HIV   History of asthma   History of alcohol abuse   PLAN   Cannot advance diet since he isn't adequately tolerating clears. He does need nutrition is concerning. At this point it is reasonable to initiate Cortrak feedings. Can continue clear as tolerated.    Subjective   Around 9 pm last night he vomited dinner meal( mainly clear liquids). Hasn't vomited yet today and wants to advance diet.   Objective   GI Studies:    Recent Labs    01/12/24 0216 01/14/24 0426  WBC 9.7 9.2  HGB 12.7* 11.0*  HCT 38.8* 33.7*  MCV 88.2 89.6  PLT 268 236   No results for input(s): FOLATE, VITAMINB12, FERRITIN, TIBC, IRONPCTSAT in the last 72 hours. Recent Labs    01/12/24 0216 01/14/24 0426  NA 137 139  K 4.2 3.9  CL 101 101  CO2 27 30  GLUCOSE 119* 117*  BUN 6 8  CREATININE 0.60* 0.65   CALCIUM  9.0 8.8*   Recent Labs    01/12/24 0216 01/14/24 0426  PROT 6.5 6.2*  ALBUMIN  3.6 3.1*  AST 18 15  ALT 20 15  ALKPHOS 64 57  BILITOT 0.6 0.4      Imaging:  CT ABDOMEN PELVIS W CONTRAST CLINICAL DATA:  33 year old male with severe abdominal pain since 2100 hours. Pain radiating to the back with nausea. History of complicated pancreatitis. Abnormal CT Abdomen and Pelvis in June.  EXAM: CT ABDOMEN AND PELVIS WITH CONTRAST  TECHNIQUE: Multidetector CT imaging of the abdomen and pelvis was performed using the standard protocol following bolus administration of intravenous contrast.  RADIATION DOSE REDUCTION: This exam was performed according to the departmental dose-optimization program which includes automated exposure control, adjustment of the mA and/or kV according to patient size and/or use of iterative reconstruction technique.  CONTRAST:  75mL OMNIPAQUE  IOHEXOL  350 MG/ML SOLN  COMPARISON:  CT Abdomen and Pelvis 12/07/2023 and earlier.  FINDINGS: Lower chest: Previous sternotomy. Partially visible prosthetic heart valve. Normal heart size. No pericardial or pleural effusion. Mild dependent atelectasis.  Hepatobiliary: Small volume perihepatic ascites has not significantly changed. Inflammation at the porta hepatis not significantly changed. Gallbladder only mildly affected. Liver enhancement remains normal. No bile duct enlargement.  Pancreas: Severely abnormal.  Severe chronic dilatation of the main pancreatic duct with atrophied pancreatic head, anterior body. Coarse dystrophic calcifications throughout the pancreatic head and uncinate. Partially atrophied pancreatic tail.  Confluent  surrounding peripancreatic inflammation especially to the right of midline and severely affecting the distal stomach and duodenal bulb as before.  However, both an hourglass shaped 3 cm low-density pseudocyst and rounded 5 cm complicated pseudocyst or  peripancreatic hematoma have regressed since 12/07/2023 (small residual on series 3, images 34 and 39, respectively).  No progressive pancreatic collection. No new pancreatic necrosis identified.  Spleen: Stable and negative.  Adrenals/Urinary Tract: Stable and negative.  Stomach/Bowel: Nondilated large and small bowel throughout the abdomen and pelvis.  However, confluent inflammation throughout the distal stomach, proximal duodenum (coronal images 96 through 109), ongoing since June and severe. Second portion of the duodenum appears less inflamed, nondilated. Moderate inflammation throughout the hepatic flexure which abuts the abnormal 1st portion of the duodenum (also image 96, series 3, image 40.  Proximal stomach mildly distended with gas and fluid. And there is a small juxta cardiac gastric diverticulum incidentally noted on series 3, image 11, stable. Inflamed distal stomach and proximal duodenum decompressed. Inflamed hepatic flexure decompressed. Upstream non dilated right:. Retrocecal appendix nondilated. No pneumoperitoneum identified. Small volume of simple density free fluid in the right upper quadrant and in the right gutter (series 3, images 28 and 40).  Vascular/Lymphatic: Portal venous system remains patent although with chronic mass effect or narrowing on the superior mesenteric vein at the pancreas (series 3, image 33), unchanged. Splenic vein remains patent. Major arterial structures in the abdomen and pelvis remain patent. Normal caliber abdominal aorta.  No lymphadenopathy identified.  Reproductive: Negative.  Other: Small volume pelvic ascites with simple fluid density has not significantly changed from June (series 3, image 83).  Musculoskeletal: Chronic sternotomy. Maintained vertebral height. No acute osseous abnormality identified.  IMPRESSION: 1. Severe sequelae of advanced Pancreatitis, with pronounced and confluent ongoing inflammation of  the distal stomach, the proximal duodenum, and the adjacent hepatic flexure of colon. But 3 cm pancreatic pseudocyst and 5 cm peripancreatic complex cyst or hematoma have both regressed since June. Small volume ascites in the right upper quadrant, the right gutter, and the pelvis not significantly changed. And stable narrowing of the SMV which remains patent. No new or progressed complicating features identified since June.  2. Chronic sternotomy.  Minor lung base atelectasis.  Electronically Signed   By: VEAR Hurst M.D.   On: 01/11/2024 04:39     Scheduled inpatient medications:   dicyclomine   20 mg Oral TID AC & HS   lipase/protease/amylase  72,000 Units Oral TID AC   loratadine   10 mg Oral Daily   metoprolol  succinate  12.5 mg Oral QHS   sodium chloride  flush  3 mL Intravenous Q12H   warfarin  4 mg Oral ONCE-1600   Warfarin - Pharmacist Dosing Inpatient   Does not apply q1600   Continuous inpatient infusions:  PRN inpatient medications: albuterol , hydrALAZINE , HYDROmorphone  (DILAUDID ) injection, ondansetron  **OR** ondansetron  (ZOFRAN ) IV, oxyCODONE -acetaminophen   Vital signs in last 24 hours: Temp:  [97.9 F (36.6 C)-98.4 F (36.9 C)] 98 F (36.7 C) (08/05 1216) Pulse Rate:  [65-83] 65 (08/05 1216) Resp:  [17-19] 19 (08/05 1216) BP: (98-140)/(64-79) 106/70 (08/05 1216) SpO2:  [97 %-100 %] 100 % (08/05 1216) Last BM Date : 01/10/24  Intake/Output Summary (Last 24 hours) at 01/14/2024 1547 Last data filed at 01/13/2024 1843 Gross per 24 hour  Intake 300 ml  Output --  Net 300 ml    Intake/Output from previous day: 08/04 0701 - 08/05 0700 In: 300 [P.O.:300] Out: -  Intake/Output this shift: No intake/output  data recorded.   Physical Exam:  General: Alert thin male in NAD Heart:  Regular rate and rhythm.  Pulmonary: Normal respiratory effort Abdomen: Soft, nondistended, nontender. Normal bowel sounds. Extremities: No lower extremity edema  Neurologic: Alert and  oriented Psych: Pleasant. Cooperative     LOS: 3 days   Vina Dasen ,NP 01/14/2024, 3:47 PM

## 2024-01-14 NOTE — Progress Notes (Signed)
 PHARMACY - ANTICOAGULATION CONSULT NOTE  Pharmacy Consult for warfarin Indication: Mechanical AVR  Allergies  Allergen Reactions   Amoxicillin Hives   Patient Measurements: Height: 5' 11 (180.3 cm) Weight: 71 kg (156 lb 8.4 oz) IBW/kg (Calculated) : 75.3 HEPARIN  DW (KG): 71  Vital Signs: Temp: 98.2 F (36.8 C) (08/05 0747) Temp Source: Oral (08/05 0747) BP: 104/67 (08/05 0747) Pulse Rate: 69 (08/05 0747)  Labs: Recent Labs    01/12/24 0216 01/13/24 0438 01/14/24 0426  HGB 12.7*  --  11.0*  HCT 38.8*  --  33.7*  PLT 268  --  236  LABPROT 13.1 25.9* 26.5*  INR 0.9 2.2* 2.3*  CREATININE 0.60*  --  0.65    Estimated Creatinine Clearance: 133.1 mL/min (by C-G formula based on SCr of 0.65 mg/dL).   Medical History: Past Medical History:  Diagnosis Date   Asthma    Headache    migraines   Heart murmur    Hypokalemia 06/08/2020   Leukocytosis 01/23/2022   Mild aortic stenosis    Pancreatitis    Pneumonia    as a child x several   Substance abuse [facility])    Assessment: 5 you male presents with abdominal pain found to have pancreatitis. He is on warfarin for an On-X mechanical AVR placed in 09/2021. Prior to admission, on warfarin for 4.5mg  everyday except 6mg  on MW (last dose 01/10/2024). Pharmacy consulted to dose warfarin.  Date INR Warfarin Dose  8/2 1.0, subtherapeutic 6mg    8/3 0.9, subtherapeutic  6mg    8/4 2.2, supratherapeutic  4 mg  8/5 2.3, supratherapeutic 4 mg   CBC stable and no overt s/sx of bleeding.  Drug-Drug interactions: None noted   Goal of Therapy:  INR 1.5-2.0 Monitor platelets by anticoagulation protocol: Yes   Plan:  Warfarin 4 mg po today Daily INR, CBC, and monitoring for bleeding F/u new drug-drug interactions   Thank you for allowing pharmacy to participate in this patient's care.  Harlene Barlow, [employer], BCCP Clinical Pharmacist  01/14/2024 9:12 AM   [facility] pharmacy phone numbers are listed on [email or web address]

## 2024-01-14 NOTE — Progress Notes (Signed)
 Lab results  01/14/2024 4:23 PM  Received results from SIBO testing.  Bacterial overgrowth is suspected.  Would recommend a course of Xifaxan 550 mg p.o. 3 times daily x 14 days #42 with no refill.  Patient is currently in the hospital.  He can wait to start this until he recovers from acute pancreatitis episode.  Nursing staff will call and let him know.  Delon Failing, [name]

## 2024-01-14 NOTE — Hospital Course (Addendum)
 33 y.o. male with medical history significant of hypertension, bicuspid aortic valve with severe aortic insufficiency and moderate aortic stenosis s/p mechanical AV replacement on 09/27/2021 on chronic anticoagulation, recurrent pancreatitis, asthma, GERD, and prior history of alcohol abuse presents with abdominal pain found to have AKI pancreatitis with pseudocyst, GI was consulted and admitted for conservative management. Patient had multiple hospitalization for pancreatitis. Patient with decreased tolerance to diet and w/ severe malnutrition> cortrak placed post pyloric on 01/15/24, diet orally was slowly advanced  8/13.  Patient with dilation of the pancreatic duct with calcifications consistent with chronic pancreatitis, increase in the multi loculated fluid collection, decreased ascites 8/14.  Case discussed with gastroenterology.  MRCP of the abdomen ordered.  Okay to start tube feeding after MRCP done. 8/15.  Lipase up to 451.  MRCP showing diffuse inflammatory fat stranding about the pancreas consistent with acute pancreatitis.  Severe ductal dilation consistent with chronic stigmata of pancreatitis.  Interval development of a fluid collection 6.9 x 2.3 x 3.0 cm and a prior fluid collection 3.0 x 2.1 x 3.0 cm.  Started on octreotide  by gastroenterology 8/16.  Cortrak tube came out last night.  Case discussed with gastroenterology and will watch without tube at this point.  Keep on full liquid diet.  Lipase down to 142. 8/17.  Continue full liquid diet.   CC: abd pain HPI: [name] [name] is a 33 y.o. male with medical history significant of hypertension, bicuspid aortic valve with severe aortic insufficiency and moderate aortic stenosis s/p mechanical aortic valve replacement in arthroplasty on 09/27/2021 on chronic anticoagulation, recurrent pancreatitis, asthma, GERD, and prior history of alcohol abuse presents with abdominal pain.   The patient has been experiencing abdominal  pain since around 9 PM last night, describing it as more intense than usual and primarily located in the left upper quadrant, with increased pain on the right side as well. The pain is exacerbated by movement. He has nausea, but has not vomited.   He has a history of pancreatic pseudocyst, which was previously evaluated with an endoscopy in April. A follow-up endoscopy was advised six months later.  At that time he reported also having at least one pancreatic stone in a duct. He was just recently hospitalized in June for reoccurrence of pancreatitis and was treated with IV fluids and pain medication. He has experienced flare-ups that did not require hospitalization, with the most recent one occurring a week and a half ago.   He has a history of heart surgery with valve replacement and is on anticoagulation therapy with an INR goal of 1.5 to 2.   He has not consumed alcohol in the past ten months. He reports having loose stools and mild gas but no fever or chills. He has been off Truvada for about a week and a half due to stomach upset. Current medications include Creon , which he takes with meals, and metoprolol .   In the ED patient was noted to be mildly bradycardic with all vital signs otherwise maintained.  Labs significant for lipase 338, WBC 12.3, and hemoglobin 12.7.  CT scan of the abdomen pelvis noted severe sequelae of advanced pancreatitis with pronounced and confluent ongoing inflammation of the distal stomach the proximal duodenum and adjacent hepatic flexure with a 3 cm pancreatic pseudocyst and 5 cm pancreatic complex cyst or hematoma with regression since June.  Patient was given 1 L bolus of lactated Ringer 's, morphine , and Zofran .  Significant Events: Admitted 01/11/2024 for recurrent pancreatitis with pseudocyst  01-11-2024 GI consulted due to recurrent pancreatitis with pseudocyst formation 01-11-2024 through 01-21-2024 pt spent several days NPO, cortrak placed on 01-15-2024 and he was  started on post-pylori feedings. 8/13.  Patient with dilation of the pancreatic duct with calcifications consistent with chronic pancreatitis, increase in the multi loculated fluid collection, decreased ascites 8/14.  Case discussed with gastroenterology.  MRCP of the abdomen ordered.  Okay to start tube feeding after MRCP done. 8/15.  Lipase up to 451.  MRCP showing diffuse inflammatory fat stranding about the pancreas consistent with acute pancreatitis.  Severe ductal dilation consistent with chronic stigmata of pancreatitis.  Interval development of a fluid collection 6.9 x 2.3 x 3.0 cm and a prior fluid collection 3.0 x 2.1 x 3.0 cm.  Started on octreotide  by gastroenterology 8/16.  Cortrak tube came out last night.  Case discussed with gastroenterology and will watch without tube at this point.  Keep on full liquid diet.  Lipase down to 142. 8/17.  Continue full liquid diet. 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 Pt had been on octreotide  started by GI since 01-24-2024. Surgery stopped Octreotide .  Admission Labs: INR 1.0 Lipase 338 Na 138, K 4.0, CO2 of 25, BUN 9, Scr 0.77, glu 132 WBC 12.3, HgB 12.7, plt 302 CRP < 0.5  Admission Imaging Studies: CT abd/pelvis Severe sequelae of advanced Pancreatitis, with pronounced and confluent ongoing inflammation of the distal stomach, the proximal duodenum, and the adjacent hepatic flexure of colon. But 3 cm pancreatic pseudocyst and 5 cm peripancreatic complex cyst or hematoma have both regressed since June. Small volume ascites in the right upper quadrant, the right gutter, and the pelvis not significantly changed. And stable narrowing of the SMV which remains patent. No new or progressed complicating features identified since June. 2. Chronic sternotomy.  Minor lung base atelectasis.  Significant Labs: 01-17-2024 prealbumin 22 01-23-2024 CA 19-9 was 8 01-23-2024 CEA <0.6  Significant Imaging  Studies: 01-22-2024 CT abd/pelvis Dilation of the pancreatic duct and coarse calcifications in the pancreatic head consistent with stigmata of chronic pancreatitis, similar to prior. Normal pancreatic enhancement. 2. Interval increase in size of a multiloculated fluid collection with rim enhancement anterior to the pancreatic neck extending to posterior wall of the gastric antrum , most consistent with peripancreatic abscess formation. Gastric wall connection cannot be excluded. Recommend future follow-ups with dedicated MRCP with contrast if clinically warranted. 3.  Decreased ascites and perigastric/perihepatic free fluid. 4. Gastrojejunostomy tube terminates in proximal jejunum. Normal gastric size. 01-23-2024 MRCP Diffuse inflammatory fat stranding about the pancreas, consistent with acute pancreatitis. 2. Severe pancreatic ductal dilatation measuring up to 1.0 cm in caliber in the pancreatic neck, the pancreatic duct effaced within the central pancreatic head. 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 3. Since earlier same day examination, interval development of an elongated acute pancreatic fluid collection superior to the pancreatic body and tail and adjacent to the lesser curvature of the stomach, measuring 6.9 x 2.3 x 3.0 cm. 4. Unchanged heterogeneous fluid collection ventral to the pancreatic head, involving the severely inflamed gastric antrum and pylorus measuring 3.0 x 2.1 x 3.0 cm, consistent with acute pancreatic fluid collection and concerning for ulceration to the bowel lumen given size, complexity, and significant involvement of the bowel wall. 5. Presence or absence of infection within the above described fluid collections is not established by imaging. 6. Small volume ascites. 01-27-2024 CT abd/pelvis Areas of curvilinear gas density are seen between the dependent  intraluminal gastric fluid and the wall of the stomach in the posterior and lateral portions of the  fundus, concerning for pneumatosis. The possibility that this represents a layer of gas trapped between the gastric contents and the wall is not entirely excluded, but gas is tracking along the dependent wall the stomach which would be somewhat unusual. 2. Marked dilatation of the main pancreatic duct measuring 12 mm diameter today compared to about 11 mm diameter previously. Pancreatic parenchyma enhances throughout. 3. The fluid collection identified on previous MRI between the pancreatic tail and lesser curvature of the stomach measures 5.7 x 2.0 cm today compared to 6.9 x 2.3 cm on the previous MRI. 4. Bilobed fluid collection anterior to the pancreatic head has increased from 2.3 x 1.3 cm previously. 5. 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 The central splenic vein is also markedly attenuated at the confluence but splenic vein remains patent. 6. Scattered tiny foci of extraluminal gas in the anterior abdomen. The volume of this trace gas appear stable to minimally decreased in the interval since prior CT. One collection is seen just deep to the right rectus musculature on axial 93/5. This was present on the previous CT and may be within the preperitoneal space as opposed to truly intraperitoneal in location although intraperitoneal gas is a possibility. Another collection is seen along the left rectus musculature but appears to be within the left rectus sheath. Both of these findings were present previously. 7. Trace free fluid in the pelvis.  Antibiotic Therapy: Anti-infectives (From admission, onward)    Start     Dose/Rate Route Frequency Ordered Stop   01/18/24 1230  rifaximin  (XIFAXAN ) tablet 550 mg  Status:  Discontinued        550 mg Oral 3 times daily 01/18/24 1132 01/26/24 1323       Procedures: 01-15-2024 Cortrak placement 01-27-2024 Cortrak placement  Consultants: GI General surgery

## 2024-01-15 ENCOUNTER — Encounter: Payer: Self-pay | Admitting: Gastroenterology

## 2024-01-15 ENCOUNTER — Ambulatory Visit: Admitting: Gastroenterology

## 2024-01-15 ENCOUNTER — Inpatient Hospital Stay (HOSPITAL_COMMUNITY)

## 2024-01-15 ENCOUNTER — Ambulatory Visit: Payer: Self-pay | Admitting: Internal Medicine

## 2024-01-15 ENCOUNTER — Other Ambulatory Visit: Payer: Self-pay

## 2024-01-15 DIAGNOSIS — E440 Moderate protein-calorie malnutrition: Secondary | ICD-10-CM

## 2024-01-15 DIAGNOSIS — K8681 Exocrine pancreatic insufficiency: Secondary | ICD-10-CM | POA: Diagnosis not present

## 2024-01-15 DIAGNOSIS — K859 Acute pancreatitis without necrosis or infection, unspecified: Secondary | ICD-10-CM | POA: Diagnosis not present

## 2024-01-15 DIAGNOSIS — K861 Other chronic pancreatitis: Secondary | ICD-10-CM

## 2024-01-15 DIAGNOSIS — R1013 Epigastric pain: Secondary | ICD-10-CM

## 2024-01-15 DIAGNOSIS — F1011 Alcohol abuse, in remission: Secondary | ICD-10-CM | POA: Diagnosis not present

## 2024-01-15 DIAGNOSIS — E43 Unspecified severe protein-calorie malnutrition: Secondary | ICD-10-CM

## 2024-01-15 LAB — GENESEQ PLUS, CFTR

## 2024-01-15 LAB — PROTIME-INR
INR: 2.3 — ABNORMAL HIGH (ref 0.8–1.2)
Prothrombin Time: 26.5 s — ABNORMAL HIGH (ref 11.4–15.2)

## 2024-01-15 LAB — PANCREATITIS: SPINK1

## 2024-01-15 LAB — PHOSPHORUS: Phosphorus: 3.9 mg/dL (ref 2.5–4.6)

## 2024-01-15 LAB — GLUCOSE, CAPILLARY: Glucose-Capillary: 114 mg/dL — ABNORMAL HIGH (ref 70–99)

## 2024-01-15 LAB — MAGNESIUM: Magnesium: 1.8 mg/dL (ref 1.7–2.4)

## 2024-01-15 LAB — PANCREATITIS: PRSS1

## 2024-01-15 MED ORDER — LIDOCAINE VISCOUS HCL 2 % MT SOLN
3.0000 mL | Freq: Once | OROMUCOSAL | Status: AC
  Administered 2024-01-15: 3 mL via OROMUCOSAL
  Filled 2024-01-15 (×2): qty 15

## 2024-01-15 MED ORDER — WARFARIN SODIUM 4 MG PO TABS
4.0000 mg | ORAL_TABLET | Freq: Once | ORAL | Status: AC
  Administered 2024-01-15: 4 mg via ORAL
  Filled 2024-01-15: qty 1

## 2024-01-15 MED ORDER — IOHEXOL 300 MG/ML  SOLN: 25.0000 mL | Freq: Once | INTRAMUSCULAR | Status: DC | PRN

## 2024-01-15 MED ORDER — OSMOLITE 1.5 CAL PO LIQD
1000.0000 mL | ORAL | Status: DC
  Administered 2024-01-15 – 2024-01-19 (×4): 1000 mL
  Filled 2024-01-15 (×15): qty 1000

## 2024-01-15 MED ORDER — HYDROXYZINE HCL 25 MG PO TABS
25.0000 mg | ORAL_TABLET | Freq: Three times a day (TID) | ORAL | Status: DC | PRN
  Administered 2024-01-15 – 2024-01-28 (×7): 25 mg via ORAL
  Filled 2024-01-15 (×5): qty 1

## 2024-01-15 MED ORDER — PROSOURCE TF20 ENFIT COMPATIBL EN LIQD
60.0000 mL | Freq: Every day | ENTERAL | Status: DC
  Administered 2024-01-15 – 2024-01-28 (×15): 60 mL
  Filled 2024-01-15 (×13): qty 60

## 2024-01-15 MED ORDER — LORAZEPAM 1 MG PO TABS
1.0000 mg | ORAL_TABLET | Freq: Once | ORAL | Status: AC
  Administered 2024-01-15: 1 mg via SUBLINGUAL
  Filled 2024-01-15: qty 1

## 2024-01-15 MED ORDER — PHENOL 1.4 % MT LIQD
1.0000 | OROMUCOSAL | Status: DC | PRN
  Administered 2024-01-15: 1 via OROMUCOSAL
  Filled 2024-01-15: qty 177

## 2024-01-15 MED ORDER — FREE WATER
50.0000 mL | Status: DC
  Administered 2024-01-15 – 2024-01-24 (×104): 50 mL

## 2024-01-15 NOTE — Progress Notes (Signed)
 Transition of Care [facility]) - Inpatient Brief Assessment   Patient Details  Name: Cortez Raymond Rexford Dobek MRN: [id number] Date of Birth: Aug 18, 1990  [facility] of [facility] CM/SW Contact:    [name] JONELLE Joe, RN Phone Number: 01/15/2024, 11:05 AM   Clinical Narrative: Cm with IP care management met with the patient at the bedside.  Patient lives at home alone and plans to return home when medically stable - no date determined at this time.  Cortrak was placed and patient is waiting for tube feedings to be started.  Patient was tearful and anxious - emotional support provided to the patient.  Patient states that he does not smoke nor use drugs and states that he stopped drinking ETOH more than 10 months ago.  No IP care management needs at this time.  Please place [facility] consult if needs arise.   Transition of Care Asessment: Insurance and Status: (P) Insurance coverage has been reviewed Patient has primary care physician: (P) Yes Home environment has been reviewed: (P) from home alone Prior level of function:: (P) Independent Prior/Current Home Services: (P) No current home services Social Drivers of Health Review: (P) SDOH reviewed interventions complete Readmission risk has been reviewed: (P) Yes Transition of care needs: (P) no transition of care needs at this time

## 2024-01-15 NOTE — Progress Notes (Signed)
 PRSS1 testing No variants were detected.  Spink 1 No variants were detected.  These results will be scanned into the chart

## 2024-01-15 NOTE — Progress Notes (Signed)
 Daily Progress Note  DOA: 01/11/2024 Hospital Day: 5  Patient Profile:   33 year old male with a past medical history including but not limited to chronic pancreatitis complicated by pseudocyst, hypertension, mechanical valve replacement 2023 on chronic warfarin.  Patient admitted with abdominal pain, acute on chronic pancreatitis   ASSESSMENT    Abdominal pain /  acute on chronic pancreatitis Prior Etoh abuse ( in remission) . Unable to consistently hold down clear liquids so Cortrak has been placed. Enteral feedings have not yet started ( IR had to advance the Cortrak beyond the pylorus).   TODAY>> Tolerated some grits and juice this am.    SIBO Positive breath test.     History of aortic valve replacement  On chronic warfarin    Principal Problem:   Chronic recurrent pancreatitis (HCC) Active Problems:   S/P AVR (aortic valve replacement) and aortoplasty   Subtherapeutic international normalized ratio (INR)   Essential hypertension   Pseudocyst of pancreas   On pre-exposure prophylaxis for HIV   History of asthma   History of alcohol abuse   Moderate malnutrition (HCC)   PLAN   --Can continue clear liquids as tolerated --Enteral feeding to start soon.  --Will treat SIBO at later date   Subjective   Tolerated some grits and juice this am. Cortrak placement was pretty traumatic for him.   Having BMs   Objective   GI Studies:    Recent Labs    01/14/24 0426  WBC 9.2  HGB 11.0*  HCT 33.7*  MCV 89.6  PLT 236   No results for input(s): FOLATE, VITAMINB12, FERRITIN, TIBC, IRONPCTSAT in the last 72 hours. Recent Labs    01/14/24 0426  NA 139  K 3.9  CL 101  CO2 30  GLUCOSE 117*  BUN 8  CREATININE 0.65  CALCIUM  8.8*   Recent Labs    01/14/24 0426  PROT 6.2*  ALBUMIN  3.1*  AST 15  ALT 15  ALKPHOS 57  BILITOT 0.4      Imaging:  DG Abd 1 View CLINICAL DATA:  Feeding tube adjustment in the radiology  department.  EXAM: ABDOMEN - 1 VIEW  COMPARISON:  01/15/2024.  FINDINGS: Feeding tube terminates at the ligament of Treitz. Contrast was injected and position confirmed.  IMPRESSION: Satisfactory feeding tube position.  Electronically Signed   By: Newell Eke M.D.   On: 01/15/2024 14:57 DG Abd Portable 1V CLINICAL DATA:  Feeding tube placement  EXAM: PORTABLE ABDOMEN - 1 VIEW  COMPARISON:  None Available.  FINDINGS: Enteric tube tip terminates in gastric antrum. The bowel gas pattern is normal. No radio-opaque calculi or other significant radiographic abnormality are seen. Sternotomy wires identified.  IMPRESSION: Enteric tube tip in gastric antrum.  Electronically Signed   By: Megan  Zare M.D.   On: 01/15/2024 11:40     Scheduled inpatient medications:   dicyclomine   20 mg Oral TID AC & HS   lidocaine   3 mL Mouth/Throat Once   lipase/protease/amylase  72,000 Units Oral TID AC   loratadine   10 mg Oral Daily   metoprolol  succinate  12.5 mg Oral QHS   sodium chloride  flush  3 mL Intravenous Q12H   warfarin  4 mg Oral ONCE-1600   Warfarin - Pharmacist Dosing Inpatient   Does not apply q1600   Continuous inpatient infusions:  PRN inpatient medications: albuterol , hydrALAZINE , HYDROmorphone  (DILAUDID ) injection, hydrOXYzine , iohexol , ondansetron  **OR** ondansetron  (ZOFRAN ) IV, oxyCODONE -acetaminophen   Vital signs in last 24 hours: Temp:  [  97.8 F (36.6 C)-98.7 F (37.1 C)] 98.3 F (36.8 C) (08/06 1233) Pulse Rate:  [53-69] 53 (08/06 1233) Resp:  [18-19] 19 (08/06 1233) BP: (97-127)/(65-74) 115/72 (08/06 1233) SpO2:  [97 %-100 %] 99 % (08/06 1233) Last BM Date : 01/14/24  Intake/Output Summary (Last 24 hours) at 01/15/2024 1505 Last data filed at 01/15/2024 0829 Gross per 24 hour  Intake 236 ml  Output --  Net 236 ml    Intake/Output from previous day: No intake/output data recorded. Intake/Output this shift: Total I/O In: 236 [P.O.:236] Out: -     Physical Exam:  General: Alert thin male in NAD Heart:  Regular rate and rhythm.  Pulmonary: Normal respiratory effort Abdomen: Soft, nondistended, nontender. Normal bowel sounds. Extremities: No lower extremity edema  Neurologic: Alert and oriented Psych: Pleasant. Cooperative     LOS: 4 days   Vina Dasen ,NP 01/15/2024, 3:05 PM

## 2024-01-15 NOTE — Progress Notes (Signed)
 Initial Nutrition Assessment  DOCUMENTATION CODES:   Severe malnutrition in context of chronic illness  INTERVENTION:  -Continue FL diet as ordered -Cortrak placed post-pyloric today for EN, had to be advanced via IR -Initiate Osmolite 1.5 @ 10 ml/hr to be advanced as tolerated 10 ml/hr q-8 to a goal rate of 60 ml/hr via Cortrak. -Add ProSource TF20 qday to meet protein needs (provides 20 g pro, 80 kcal)  At goal this provides 2240 kcal, 110 g protein, 1094 ml fluid  Also, 0 g fiber, 71 g fat  Free water  flushes 50 ml q-2 to provide a total of 1694 ml fluid  -Pt is HIGH RISK for refeeding syndrome. Continue to monitor electrolytes (potassium, phosphorus, magnesium )   NUTRITION DIAGNOSIS:   Severe Malnutrition related to vomiting, chronic illness, altered GI function as evidenced by moderate fat depletion, severe muscle depletion.  GOAL:   Patient will meet greater than or equal to 90% of their needs   MONITOR:   PO intake, Weight trends, TF tolerance, Supplement acceptance, Diet advancement, Labs, Skin  REASON FOR ASSESSMENT:   New TF, Diagnosis    ASSESSMENT:   Hx hypertension, bicuspid aortic valve with severe aortic insufficiency and moderate aortic stenosis s/p mechanical aortic valve replacement in arthroplasty on 09/27/2021 on chronic anticoagulation, recurrent pancreatitis, asthma, GERD, and prior history of alcohol abuse presents with abdominal pain.  Spoke to pt at bedside. Pt Cortrak placed today, however, xray confirmed end in the pt's stomach. IR consulted to progress Cortrak post-pyloric. Pt expressing discomfort/anxiety around tube. Validated pt's discomfort, discussed importance of adequate nutrition. Pt states he will continue to try to drink fluid as able/allowed, but does not think he could eat solids with Cortrak in. Plan to initiate Osmolite 1.5 @ 10 ml/hr to be advanced to goal 60 ml/hr. Osmolite 1.5 chosen r/t low fat content, 0 fiber content, low-residue,  meeting estimated nutrition needs. Pt continuing to have emesis long after eating, however, still throwing up what he previously ate.  Documented weight loss of 10.2 kg (12.6%) x 12 months, 6.4 kg (8.3%) x 4 months. NFPE completed (see below), pt meets criteria for severe protein calorie malnutrition. Discussed plan with pt for EN, pt agreeable, verbalizes understanding. Pt denies additional questions/concerns at this time, will continue to monitor, RDN available prn.   Labs BG 117 Calcium  8.8 Albumin  3.1 Lipase 338 H/H 11.0/33.7  Medications  dicyclomine   20 mg Oral TID AC & HS   lidocaine   3 mL Mouth/Throat Once   lipase/protease/amylase  72,000 Units Oral TID AC   loratadine   10 mg Oral Daily   metoprolol  succinate  12.5 mg Oral QHS   sodium chloride  flush  3 mL Intravenous Q12H   warfarin  4 mg Oral ONCE-1600   Warfarin - Pharmacist Dosing Inpatient   Does not apply q1600     NUTRITION - FOCUSED PHYSICAL EXAM:  Flowsheet Row Most Recent Value  Orbital Region Moderate depletion  Upper Arm Region Moderate depletion  Thoracic and Lumbar Region Moderate depletion  Buccal Region Moderate depletion  Temple Region Moderate depletion  Clavicle Bone Region Severe depletion  Clavicle and Acromion Bone Region Severe depletion  Scapular Bone Region Moderate depletion  Dorsal Hand Moderate depletion  Patellar Region Moderate depletion  Anterior Thigh Region Moderate depletion  Posterior Calf Region Moderate depletion  Edema (RD Assessment) None  Hair Reviewed  Eyes Reviewed  Mouth Reviewed  Skin Reviewed  Nails Reviewed    Diet Order:   Diet Order  Diet full liquid Room service appropriate? Yes; Fluid consistency: Thin  Diet effective now                 EDUCATION NEEDS:   Education needs have been addressed  Skin:  Skin Assessment: Reviewed RN Assessment  Last BM:  8/6 Type 1  Height:   Ht Readings from Last 1 Encounters:  01/11/24 5' 11 (1.803 m)     Weight:   Wt Readings from Last 1 Encounters:  01/11/24 71 kg    BMI:  Body mass index is 21.83 kg/m.  Estimated Nutritional Needs:   Kcal:  2000-2350 kcal  Protein:  105-140 g  Fluid:  >/=2L  [name] [name], RDN, LDN Registered Dietitian Nutritionist RD Inpatient Contact Info in [place]

## 2024-01-15 NOTE — Plan of Care (Signed)

## 2024-01-15 NOTE — Progress Notes (Signed)
 PROGRESS NOTE [name] [name]  FMW:[id number] DOB: 07/29/1990 DOA: 01/11/2024 PCP: Lucius Krabbe, NP  Brief Narrative/Hospital Course: 33 y.o. male with medical history significant of hypertension, bicuspid aortic valve with severe aortic insufficiency and moderate aortic stenosis s/p mechanical AV replacement on 09/27/2021 on chronic anticoagulation, recurrent pancreatitis, asthma, GERD, and prior history of alcohol abuse presents with abdominal pain found to have AKI pancreatitis with pseudocyst, GI was consulted and admitted for conservative management. Patient had multiple hospitalization for pancreatitis.  Subjective: Seen and examined today No more vomiting last night but still feeling gassy Had a small bowel movement  Assessment and plan:  Acute on chronic pancreatitis Abdominal pain Previous alcohol use currently in remission Pseudocyst: GI following, input appreciated, patient has not progressed much, 6 to 8 hours after oral intake of liquid diet having vomiting, poor oral intake  continue antiemetics,bentyl , panreastic enzymes, ivf, pain meds Discussed with GI and planning for postpyloric feeding  S/P mechanical aortic valve replacement with arthroplasty: Had bicuspid aortic valve with severe AI and moderate AS s/p replacement. Continued his Coumadin  per pharmacy for INR goal  Recent Labs  Lab 01/11/24 0153 01/12/24 0216 01/13/24 0438 01/14/24 0426 01/15/24 0332  INR 1.0 0.9 2.2* 2.3* 2.3*     Essential hypertension Well-controlled on metoprolol    Asthma Not wheezing. Cont  prn nebs   On preexposure prophylaxis for HIV Patient had been on Truvada for prophylaxis but stopped about a week and a half ago due to his symptoms. Last HIV test nonreactive 04/03/2023. Resume on discharge   History of alcohol abuse now in remission x 10 months: Encourage    Normocytic anemia: Likely secondary to his chronic pancreatitis  Mobility: Ambulatory at  baseline  DVT prophylaxis: Coumadin  Code Status:   Code Status: Full Code Family Communication: plan of care discussed with patient at bedside. Patient status is: Remains hospitalized because of severity of illness Level of care: Telemetry Medical   Dispo: The patient is from: HOME            Anticipated disposition: TBD Objective: Vitals last 24 hrs: Vitals:   01/14/24 1624 01/14/24 2033 01/15/24 0400 01/15/24 0808  BP: 127/68 114/69 97/65 114/74  Pulse: 66 65 69 (!) 58  Resp: 19 18 18 19   Temp: 98.3 F (36.8 C) 98.2 F (36.8 C) 97.8 F (36.6 C) 98.7 F (37.1 C)  TempSrc:  Oral Oral Oral  SpO2: 100% 100% 97% 100%  Weight:      Height:        Physical Examination: General exam: aaox3 HEENT:Oral mucosa moist, Ear/Nose WNL grossly Respiratory system: Bilaterally clear BS,no use of accessory muscle Cardiovascular system: S1 & S2 +, No JVD. Gastrointestinal system: Abdomen soft, mild generalized tenderness present nondistended bowel sounds present  Nervous System: Alert, awake, moving all extremities,and following commands. Extremities: LE edema neg, distal extremities warm.  Skin: No rashes,no icterus. MSK: Normal muscle bulk,tone, power   Medications reviewed:  Scheduled Meds:  dicyclomine   20 mg Oral TID AC & HS   lipase/protease/amylase  72,000 Units Oral TID AC   loratadine   10 mg Oral Daily   metoprolol  succinate  12.5 mg Oral QHS   sodium chloride  flush  3 mL Intravenous Q12H   Warfarin - Pharmacist Dosing Inpatient   Does not apply q1600   Continuous Infusions: Diet: Diet Order             Diet full liquid Room service appropriate? Yes; Fluid consistency: Thin  Diet effective now  Data Reviewed: I have personally reviewed following labs and imaging studies ( see epic result tab) CBC: Recent Labs  Lab 01/11/24 0155 01/12/24 0216 01/14/24 0426  WBC 12.3* 9.7 9.2  HGB 12.7* 12.7* 11.0*  HCT 40.1 38.8* 33.7*  MCV 90.5 88.2 89.6   PLT 302 268 236   CMP: Recent Labs  Lab 01/11/24 0155 01/12/24 0216 01/14/24 0426  NA 138 137 139  K 4.0 4.2 3.9  CL 101 101 101  CO2 25 27 30   GLUCOSE 132* 119* 117*  BUN 9 6 8   CREATININE 0.77 0.60* 0.65  CALCIUM  9.3 9.0 8.8*   GFR: Estimated Creatinine Clearance: 133.1 mL/min (by C-G formula based on SCr of 0.65 mg/dL). Recent Labs  Lab 01/11/24 0155 01/12/24 0216 01/14/24 0426  AST 27 18 15   ALT 25 20 15   ALKPHOS 73 64 57  BILITOT <0.2 0.6 0.4  PROT 7.3 6.5 6.2*  ALBUMIN  4.3 3.6 3.1*    Recent Labs  Lab 01/11/24 0155  LIPASE 338*   No results for input(s): AMMONIA in the last 168 hours. Coagulation Profile:  Recent Labs  Lab 01/11/24 0153 01/12/24 0216 01/13/24 0438 01/14/24 0426 01/15/24 0332  INR 1.0 0.9 2.2* 2.3* 2.3*   Unresulted Labs (From admission, onward)     Start     Ordered   01/12/24 0500  Protime-INR  Daily,   R      01/11/24 9072           Antimicrobials/Microbiology: Anti-infectives (From admission, onward)    None      No results found for: SDES, SPECREQUEST, CULT, REPTSTATUS  Procedures:  Mennie LAMY, MD [facility] 01/15/2024, 9:57 AM

## 2024-01-15 NOTE — Procedures (Signed)
 Cortrak  Person Inserting Tube:  [name] [name] C, RD Tube Type:  Cortrak - 55 inches Tube Size:  10 Tube Location:  Left nare Secured by: Bridle Technique Used to Measure Tube Placement:  Marking at nare/corner of mouth Cortrak Secured At:  77 cm   Cortrak Tube Team Note:  Consult received to place a Cortrak feeding tube.   x-ray is required. RN may begin using tube once confirmed by xray. .   If the tube becomes dislodged please keep the tube and contact the Cortrak team at [email or web address] for replacement.  If after hours and replacement cannot be delayed, place a NG tube and confirm placement with an abdominal x-ray.    [name] SQUIBB., RD, LDN, CNSC See AMiON for contact information

## 2024-01-15 NOTE — Progress Notes (Signed)
 PHARMACY - ANTICOAGULATION CONSULT NOTE  Pharmacy Consult for warfarin Indication: Mechanical AVR  Allergies  Allergen Reactions   Amoxicillin Hives   Patient Measurements: Height: 5' 11 (180.3 cm) Weight: 71 kg (156 lb 8.4 oz) IBW/kg (Calculated) : 75.3 HEPARIN  DW (KG): 71  Vital Signs: Temp: 98.7 F (37.1 C) (08/06 0808) Temp Source: Oral (08/06 0808) BP: 114/74 (08/06 0808) Pulse Rate: 58 (08/06 0808)  Labs: Recent Labs    01/13/24 0438 01/14/24 0426 01/15/24 0332  HGB  --  11.0*  --   HCT  --  33.7*  --   PLT  --  236  --   LABPROT 25.9* 26.5* 26.5*  INR 2.2* 2.3* 2.3*  CREATININE  --  0.65  --     Estimated Creatinine Clearance: 133.1 mL/min (by C-G formula based on SCr of 0.65 mg/dL).   Medical History: Past Medical History:  Diagnosis Date   Asthma    Headache    migraines   Heart murmur    Hypokalemia 06/08/2020   Leukocytosis 01/23/2022   Mild aortic stenosis    Pancreatitis    Pneumonia    as a child x several   Substance abuse [facility])    Assessment: 19 you male presents with abdominal pain found to have pancreatitis. He is on warfarin for an On-X mechanical AVR placed in 09/2021. Prior to admission, on warfarin for 4.5mg  everyday except 6mg  on MW (last dose 01/10/2024). Pharmacy consulted to dose warfarin.  Date INR Warfarin Dose  8/2 1.0, subtherapeutic 6mg    8/3 0.9, subtherapeutic  6mg    8/4 2.2, supratherapeutic  4 mg  8/5 2.3, supratherapeutic 4 mg  8/6 2.3, supratherapeutic 4 mg   CBC stable and no overt s/sx of bleeding.  Drug-Drug interactions: None noted   Goal of Therapy:  INR 1.5-2.0 Monitor platelets by anticoagulation protocol: Yes   Plan:  Warfarin 4 mg po today Daily INR, CBC, and monitoring for bleeding F/u new drug-drug interactions   Thank you for allowing pharmacy to participate in this patient's care.  [employer], Pharm.D., BCPS Clinical Pharmacist Clinical phone for 01/15/2024 from 7:30-3:00 is  [phone].  **Pharmacist phone directory can be found on [email or web address] listed under [facility].  01/15/2024 10:21 AM

## 2024-01-16 ENCOUNTER — Other Ambulatory Visit: Payer: Self-pay

## 2024-01-16 DIAGNOSIS — K311 Adult hypertrophic pyloric stenosis: Secondary | ICD-10-CM

## 2024-01-16 DIAGNOSIS — K861 Other chronic pancreatitis: Secondary | ICD-10-CM

## 2024-01-16 DIAGNOSIS — F1011 Alcohol abuse, in remission: Secondary | ICD-10-CM | POA: Diagnosis not present

## 2024-01-16 DIAGNOSIS — K8681 Exocrine pancreatic insufficiency: Secondary | ICD-10-CM | POA: Diagnosis not present

## 2024-01-16 DIAGNOSIS — K859 Acute pancreatitis without necrosis or infection, unspecified: Secondary | ICD-10-CM | POA: Diagnosis not present

## 2024-01-16 LAB — COMPREHENSIVE METABOLIC PANEL WITH GFR
ALT: 18 U/L (ref 0–44)
AST: 19 U/L (ref 15–41)
Albumin: 3.2 g/dL — ABNORMAL LOW (ref 3.5–5.0)
Alkaline Phosphatase: 57 U/L (ref 38–126)
Anion gap: 9 (ref 5–15)
BUN: 9 mg/dL (ref 6–20)
CO2: 28 mmol/L (ref 22–32)
Calcium: 9 mg/dL (ref 8.9–10.3)
Chloride: 102 mmol/L (ref 98–111)
Creatinine, Ser: 0.72 mg/dL (ref 0.61–1.24)
GFR, Estimated: >60 mL/min (ref >60)
Glucose, Bld: 108 mg/dL — ABNORMAL HIGH (ref 70–99)
Potassium: 4 mmol/L (ref 3.5–5.1)
Sodium: 139 mmol/L (ref 135–145)
Total Bilirubin: 0.2 mg/dL (ref 0.0–1.2)
Total Protein: 6.5 g/dL (ref 6.5–8.1)

## 2024-01-16 LAB — CBC
HCT: 32 % — ABNORMAL LOW (ref 39.0–52.0)
Hemoglobin: 10.5 g/dL — ABNORMAL LOW (ref 13.0–17.0)
MCH: 29.2 pg (ref 26.0–34.0)
MCHC: 32.8 g/dL (ref 30.0–36.0)
MCV: 89.1 fL (ref 80.0–100.0)
Platelets: 288 K/uL (ref 150–400)
RBC: 3.59 MIL/uL — ABNORMAL LOW (ref 4.22–5.81)
RDW: 14 % (ref 11.5–15.5)
WBC: 6.5 K/uL (ref 4.0–10.5)
nRBC: 0 % (ref 0.0–0.2)

## 2024-01-16 LAB — MAGNESIUM: Magnesium: 1.7 mg/dL (ref 1.7–2.4)

## 2024-01-16 LAB — GLUCOSE, CAPILLARY
Glucose-Capillary: 116 mg/dL — ABNORMAL HIGH (ref 70–99)
Glucose-Capillary: 117 mg/dL — ABNORMAL HIGH (ref 70–99)
Glucose-Capillary: 96 mg/dL (ref 70–99)

## 2024-01-16 LAB — PROTIME-INR
INR: 2.7 — ABNORMAL HIGH (ref 0.8–1.2)
Prothrombin Time: 30.2 s — ABNORMAL HIGH (ref 11.4–15.2)

## 2024-01-16 LAB — PHOSPHORUS: Phosphorus: 4.7 mg/dL — ABNORMAL HIGH (ref 2.5–4.6)

## 2024-01-16 NOTE — Plan of Care (Signed)
  Problem: Nutrition: Goal: Adequate nutrition will be maintained Outcome: Progressing   Problem: Coping: Goal: Level of anxiety will decrease Outcome: Progressing   Problem: Elimination: Goal: Will not experience complications related to bowel motility Outcome: Progressing Note: Per patient, had one occurrence of BM during this shift.

## 2024-01-16 NOTE — Progress Notes (Signed)
 PHARMACY - ANTICOAGULATION CONSULT NOTE  Pharmacy Consult for warfarin Indication: Mechanical AVR  Allergies  Allergen Reactions   Amoxicillin Hives   Patient Measurements: Height: 5' 11 (180.3 cm) Weight: 71 kg (156 lb 8.4 oz) IBW/kg (Calculated) : 75.3 HEPARIN  DW (KG): 71  Vital Signs: Temp: 97.5 F (36.4 C) (08/07 0950) BP: 117/69 (08/07 0950) Pulse Rate: 59 (08/07 0950)  Labs: Recent Labs    01/14/24 0426 01/15/24 0332 01/16/24 0241  HGB 11.0*  --  10.5*  HCT 33.7*  --  32.0*  PLT 236  --  288  LABPROT 26.5* 26.5* 30.2*  INR 2.3* 2.3* 2.7*  CREATININE 0.65  --  0.72    Estimated Creatinine Clearance: 133.1 mL/min (by C-G formula based on SCr of 0.72 mg/dL).   Medical History: Past Medical History:  Diagnosis Date   Asthma    Headache    migraines   Heart murmur    Hypokalemia 06/08/2020   Leukocytosis 01/23/2022   Mild aortic stenosis    Pancreatitis    Pneumonia    as a child x several   Substance abuse [facility])    Assessment: 26 you male presents with abdominal pain found to have pancreatitis. He is on warfarin for an On-X mechanical AVR placed in 09/2021. Prior to admission, on warfarin for 4.5mg  everyday except 6mg  on MW (last dose 01/10/2024). Pharmacy consulted to dose warfarin.  Date INR Warfarin Dose  8/2 1.0, subtherapeutic 6mg    8/3 0.9, subtherapeutic  6mg    8/4 2.2, supratherapeutic  4 mg  8/5 2.3, supratherapeutic 4 mg  8/6 2.3, supratherapeutic 4 mg  8/7 2.7, supratherapeutic None   CBC stable and no overt s/sx of bleeding.  INR rising despite reduced warfarin doses likely in the setting of reduced PO intake.  NG tube placed 8/6 to increase nutrition.  Drug-Drug interactions: None noted   Goal of Therapy:  INR 1.5-2.0 Monitor platelets by anticoagulation protocol: Yes   Plan:  No Warfarin today,  Daily INR, CBC, and monitoring for bleeding F/u new drug-drug interactions   Thank you for allowing pharmacy to participate in this  patient's care.  [employer], Pharm.D., BCPS Clinical Pharmacist Clinical phone for 01/16/2024 from 7:30-3:00 is [phone].  **Pharmacist phone directory can be found on [email or web address] listed under [facility].  01/16/2024 10:16 AM

## 2024-01-16 NOTE — Progress Notes (Signed)
 Nutrition Follow-up  DOCUMENTATION CODES:   Severe malnutrition in context of chronic illness  INTERVENTION:  -Continue FL diet as ordered -Continue Osmolite 1.5 @ 20 ml/hr to be advanced as tolerated 10 ml/hr q-8 to a goal rate of 60 ml/hr via Cortrak. -Add ProSource TF20 qday to meet protein needs (provides 20 g pro, 80 kcal)             At goal this provides 2240 kcal, 110 g protein, 1094 ml fluid             Also, 0 g fiber, 71 g fat             Free water  flushes 50 ml q-2 to provide a total of 1694 ml fluid   -Pt is HIGH RISK for refeeding syndrome. Continue to monitor additional electrolytes (phosphorus, magnesium ) x 3 days  NUTRITION DIAGNOSIS:   Severe Malnutrition related to vomiting, chronic illness, altered GI function as evidenced by moderate fat depletion, severe muscle depletion.  Ongoing  GOAL:   Patient will meet greater than or equal to 90% of their needs  Progressing  MONITOR:   PO intake, Weight trends, TF tolerance, Supplement acceptance, Diet advancement, Labs, Skin  REASON FOR ASSESSMENT:   New TF, Diagnosis    ASSESSMENT:   Hx hypertension, bicuspid aortic valve with severe aortic insufficiency and moderate aortic stenosis s/p mechanical aortic valve replacement in arthroplasty on 09/27/2021 on chronic anticoagulation, recurrent pancreatitis, asthma, GERD, and prior history of alcohol abuse presents with abdominal pain.  Spoke to pt in room this morning to check on EN progress. Pt up to 20 ml/hr, endorses some very mild nausea/gas. No other s/sx of EN intolerance. Pt does state his is starting to get used to the Cortrak tube, not as uncomfortable. Last BM 8/6. Pt ate some yogurt this morning, so far tolerating. Ate 25% of dinner last night with no noted issues. Assessed mg, phos levels prior to EN initiation yesterday; WNL. This morning phos is slightly elevated at 4.7, Mg WNL, K WNL, Na WNL. Discussed continuation of Creon  with GI NP, pt. GI says to  hold off on scheduled pancreatic enzymes via Cortrak at this time. Pt with questions regarding timeline of enteral nutrition. Discussed no set timeline, will assess PO tolerance and nutrition status daily and go from there. Pt verbalized understanding. Pt with no additional questions/concerns at this time, will continue to monitor, RDN available prn.   Labs BG 96-117 Phos 4.7 Albumin  3.2 Lipase 338 (8/2) H/H 10.5/32.0  Medications  dicyclomine   20 mg Oral TID AC & HS   feeding supplement (PROSource TF20)  60 mL Per Tube Daily   free water   50 mL Per Tube Q2H   lipase/protease/amylase  72,000 Units Oral TID AC   loratadine   10 mg Oral Daily   metoprolol  succinate  12.5 mg Oral QHS   sodium chloride  flush  3 mL Intravenous Q12H   Warfarin - Pharmacist Dosing Inpatient   Does not apply q1600     NUTRITION - FOCUSED PHYSICAL EXAM:  Flowsheet Row Most Recent Value  Orbital Region Moderate depletion  Upper Arm Region Moderate depletion  Thoracic and Lumbar Region Moderate depletion  Buccal Region Moderate depletion  Temple Region Moderate depletion  Clavicle Bone Region Severe depletion  Clavicle and Acromion Bone Region Severe depletion  Scapular Bone Region Moderate depletion  Dorsal Hand Moderate depletion  Patellar Region Moderate depletion  Anterior Thigh Region Moderate depletion  Posterior Calf Region Moderate depletion  Edema (RD Assessment) None  Hair Reviewed  Eyes Reviewed  Mouth Reviewed  Skin Reviewed  Nails Reviewed    Diet Order:   Diet Order             Diet full liquid Room service appropriate? Yes; Fluid consistency: Thin  Diet effective now                 EDUCATION NEEDS:   Education needs have been addressed  Skin:  Skin Assessment: Reviewed RN Assessment  Last BM:  8/6 Type 1  Height:   Ht Readings from Last 1 Encounters:  01/11/24 5' 11 (1.803 m)    Weight:   Wt Readings from Last 1 Encounters:  01/16/24 71 kg   BMI:  Body  mass index is 21.83 kg/m.  Estimated Nutritional Needs:   Kcal:  2000-2350 kcal  Protein:  105-140 g  Fluid:  >/=2L  Aubrynn Katona Daml-Budig, RDN, LDN Registered Dietitian Nutritionist RD Inpatient Contact Info in [place]

## 2024-01-16 NOTE — Telephone Encounter (Signed)
 ERCP has been set up for 03/09/24 at 330 pm at [facility] with GM   Pt is currently hospitalized and all information will be given at discharge as well as mailed to the home and sent to My Chart

## 2024-01-16 NOTE — Progress Notes (Signed)
 PROGRESS NOTE [name] [name]  FMW:[id number] DOB: 06-13-1990 DOA: 01/11/2024 PCP: Lucius Krabbe, NP  Brief Narrative/Hospital Course: 33 y.o. male with medical history significant of hypertension, bicuspid aortic valve with severe aortic insufficiency and moderate aortic stenosis s/p mechanical AV replacement on 09/27/2021 on chronic anticoagulation, recurrent pancreatitis, asthma, GERD, and prior history of alcohol abuse presents with abdominal pain found to have AKI pancreatitis with pseudocyst, GI was consulted and admitted for conservative management. Patient had multiple hospitalization for pancreatitis. Patient with decreased tolerance to diet and w/ severe malnutrition> cortrak placed post pyloric on 01/15/24  Subjective: Seen and examined Resting comfortably.  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  ID following.  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  S/P mechanical aortic valve replacement with arthroplasty: Had bicuspid aortic valve with severe AI and moderate AS s/p replacement.  INR therapeutic ( 1.5-2), pharmacy dosing  Recent Labs  Lab 01/12/24 0216 01/13/24 0438 01/14/24 0426 01/15/24 0332 01/16/24 0241  INR 0.9 2.2* 2.3* 2.3* 2.7*     Essential hypertension Well-controlled on  metoprolol    Asthma Not wheezing. Cont  prn nebs   On preexposure prophylaxis for HIV Patient had been on Truvada for prophylaxis but stopped about a week and a half ago due to his symptoms. Last HIV test nonreactive 04/03/2023. Resume on discharge   History of alcohol abuse now in remission x 10 months: Encouraged cessation.   Normocytic anemia from chronic disease: Slightly low.  Stable.  Monitor   Severe malnutrition : Postpyloric core Trak tube feeding started -augment diet as below Nutrition Problem: Severe Malnutrition Etiology: vomiting, chronic illness, altered GI function Signs/Symptoms: moderate fat depletion, severe muscle depletion Interventions: Refer to RD note for recommendations   Mobility: Ambulatory at baseline  DVT prophylaxis: Coumadin  Code Status:   Code Status: Full Code Family Communication: plan of care discussed with patient at bedside. Patient status is: Remains hospitalized because of severity of illness Level of care: Telemetry Medical   Dispo: The patient is from: HOME            Anticipated disposition: TBD Objective: Vitals last 24 hrs: Vitals:   01/15/24 2025 01/16/24 0006 01/16/24 0500 01/16/24 0505  BP: 118/73 111/78  117/71  Pulse: 74 63  (!) 51  Resp: 18 20  20   Temp: 98.3 F (36.8 C) 98 F (36.7 C)  98.1 F (36.7 C)  TempSrc: Oral     SpO2: 96% 96%  99%  Weight:   71 kg   Height:        Physical Examination: General exam: alert awake, thin and frail.  Ngt+ HEENT:Oral mucosa moist, Ear/Nose WNL grossly Respiratory system: Bilaterally clear BS,no use of accessory muscle Cardiovascular system: S1 & S2 +, No JVD. Gastrointestinal system: Abdomen soft,NT,ND, BS+ Nervous System: Alert, awake, moving all extremities,and following commands. Extremities: LE edema neg,distal peripheral pulses palpable and warm.  Skin: No rashes,no icterus. MSK: Normal  muscle bulk,tone, power   Medications reviewed:  Scheduled Meds:  dicyclomine    20 mg Oral TID AC & HS   feeding supplement (PROSource TF20)  60 mL Per Tube Daily   free water   50 mL Per Tube Q2H   lipase/protease/amylase  72,000 Units Oral TID AC   loratadine   10 mg Oral Daily   metoprolol  succinate  12.5 mg Oral QHS   sodium chloride  flush  3 mL Intravenous Q12H   Warfarin - Pharmacist Dosing Inpatient   Does not apply q1600   Continuous Infusions:  feeding supplement (OSMOLITE 1.5 CAL) 20 mL/hr at 01/16/24 0308   Diet: Diet Order             Diet full liquid Room service appropriate? Yes; Fluid consistency: Thin  Diet effective now                    Data Reviewed: I have personally reviewed following labs and imaging studies ( see epic result tab) CBC: Recent Labs  Lab 01/11/24 0155 01/12/24 0216 01/14/24 0426 01/16/24 0241  WBC 12.3* 9.7 9.2 6.5  HGB 12.7* 12.7* 11.0* 10.5*  HCT 40.1 38.8* 33.7* 32.0*  MCV 90.5 88.2 89.6 89.1  PLT 302 268 236 288   CMP: Recent Labs  Lab 01/11/24 0155 01/12/24 0216 01/14/24 0426 01/15/24 1631 01/16/24 0241  NA 138 137 139  --  139  K 4.0 4.2 3.9  --  4.0  CL 101 101 101  --  102  CO2 25 27 30   --  28  GLUCOSE 132* 119* 117*  --  108*  BUN 9 6 8   --  9  CREATININE 0.77 0.60* 0.65  --  0.72  CALCIUM  9.3 9.0 8.8*  --  9.0  MG  --   --   --  1.8 1.7  PHOS  --   --   --  3.9 4.7*   GFR: Estimated Creatinine Clearance: 133.1 mL/min (by C-G formula based on SCr of 0.72 mg/dL). Recent Labs  Lab 01/11/24 0155 01/12/24 0216 01/14/24 0426 01/16/24 0241  AST 27 18 15 19   ALT 25 20 15 18   ALKPHOS 73 64 57 57  BILITOT <0.2 0.6 0.4 0.2  PROT 7.3 6.5 6.2* 6.5  ALBUMIN  4.3 3.6 3.1* 3.2*    Recent Labs  Lab 01/11/24 0155  LIPASE 338*   No results for input(s): AMMONIA in the last 168 hours. Coagulation Profile:  Recent Labs  Lab 01/12/24 0216 01/13/24 0438 01/14/24 0426 01/15/24 0332 01/16/24 0241  INR 0.9 2.2* 2.3* 2.3* 2.7*   Unresulted Labs (From admission, onward)     Start      Ordered   01/16/24 0500  Comprehensive metabolic panel with GFR  Daily,   R      01/15/24 0958   01/16/24 0500  Magnesium   Daily,   R      01/15/24 1540   01/16/24 0500  Phosphorus  Daily,   R      01/15/24 1540   01/12/24 0500  Protime-INR  Daily,   R      01/11/24 9072           Antimicrobials/Microbiology: Anti-infectives (From admission, onward)    None      No results found for: SDES, SPECREQUEST, CULT, REPTSTATUS  Procedures:  Mennie LAMY, MD [facility] 01/16/2024, 7:39 AM

## 2024-01-16 NOTE — Progress Notes (Signed)
 Progress Note   Assessment    33 year old male with chronic pancreatitis, prior alcohol use, previous pseudocysts, mechanical valve replacement on warfarin, hypertension, recently discovered SIBO by breath test as an outpatient admitted with acute on chronic pancreatitis with partial gastric outlet obstruction and malnutrition leading to a catabolic state  Principal Problem:   Chronic recurrent pancreatitis (HCC) Active Problems:   S/P AVR (aortic valve replacement) and aortoplasty   Subtherapeutic international normalized ratio (INR)   Essential hypertension   Pseudocyst of pancreas   On pre-exposure prophylaxis for HIV   History of asthma   History of alcohol abuse   Moderate malnutrition (HCC)   Protein-calorie malnutrition, severe   Recommendations   Acute on chronic pancreatitis with malnutrition and partial gastric outlet obstruction Clinically pain has improved but he is unable to tolerate much p.o..  Tolerating tube feeds thus far in the postpyloric state.  He understands the goal to get him adequate nutrition so that he can become anabolic. --Continue to work tube feeds to goal -- As needed nausea and pain control -- Okay for clear to full liquid diet as tolerated and advance as tolerated -- Frequent ambulation -- Outpatient SIBO treatment -- Check prealb tomorrow -- ERCP set up for 03/09/2024 at 3:30 PM at [facility] with Dr. Wilhelmenia   Chief Complaint   Patient tolerated the nasoenteric tube placement and has adjusted to it Was able to sleep about 4 hours continuous last night Ambulating frequently Still having nausea and abdominal bloating/pressure after liquid diet and yogurt; though he has not vomited today 2 small BMs yesterday without overt blood or melena Tube feeds have started and working up to goal  Vital signs in last 24 hours: Temp:  [97.4 F (36.3 C)-98.3 F (36.8 C)] 97.5 F (36.4 C) (08/07 0950) Pulse Rate:  [51-74] 59 (08/07  0950) Resp:  [18-20] 20 (08/07 0505) BP: (111-122)/(69-78) 117/69 (08/07 0950) SpO2:  [96 %-100 %] 100 % (08/07 0950) Weight:  [71 kg] 71 kg (08/07 0500) Last BM Date : 01/15/24 Gen: awake, alert, NAD HEENT: anicteric, nasoenteric tube in place Ext: no c/c/e Neuro: nonfocal  Intake/Output from previous day: 08/06 0701 - 08/07 0700 In: 967.7 [P.O.:472; I.V.:3; NG/GT:492.7] Out: -  Intake/Output this shift: Total I/O In: 150 [NG/GT:150] Out: -   Lab Results: Recent Labs    01/14/24 0426 01/16/24 0241  WBC 9.2 6.5  HGB 11.0* 10.5*  HCT 33.7* 32.0*  PLT 236 288   BMET Recent Labs    01/14/24 0426 01/16/24 0241  NA 139 139  K 3.9 4.0  CL 101 102  CO2 30 28  GLUCOSE 117* 108*  BUN 8 9  CREATININE 0.65 0.72  CALCIUM  8.8* 9.0   LFT Recent Labs    01/16/24 0241  PROT 6.5  ALBUMIN  3.2*  AST 19  ALT 18  ALKPHOS 57  BILITOT 0.2   PT/INR Recent Labs    01/15/24 0332 01/16/24 0241  LABPROT 26.5* 30.2*  INR 2.3* 2.7*    Studies/Results: DG Abd 1 View Result Date: 01/15/2024 CLINICAL DATA:  Feeding tube adjustment in the radiology department. EXAM: ABDOMEN - 1 VIEW COMPARISON:  01/15/2024. FINDINGS: Feeding tube terminates at the ligament of Treitz. Contrast was injected and position confirmed. IMPRESSION: Satisfactory feeding tube position. Electronically Signed   By: Newell Eke M.D.   On: 01/15/2024 14:57   DG Abd Portable 1V Result Date: 01/15/2024 CLINICAL DATA:  Feeding tube placement EXAM: PORTABLE ABDOMEN - 1 VIEW  COMPARISON:  None Available. FINDINGS: Enteric tube tip terminates in gastric antrum. The bowel gas pattern is normal. No radio-opaque calculi or other significant radiographic abnormality are seen. Sternotomy wires identified. IMPRESSION: Enteric tube tip in gastric antrum. Electronically Signed   By: Megan  Zare M.D.   On: 01/15/2024 11:40      LOS: 5 days   Gordy CHRISTELLA Starch, MD 01/16/2024, 2:11 PM See TRACE[facility], [facility], to contact our on  call provider

## 2024-01-17 DIAGNOSIS — K311 Adult hypertrophic pyloric stenosis: Secondary | ICD-10-CM | POA: Diagnosis not present

## 2024-01-17 DIAGNOSIS — K8681 Exocrine pancreatic insufficiency: Secondary | ICD-10-CM | POA: Diagnosis not present

## 2024-01-17 DIAGNOSIS — K859 Acute pancreatitis without necrosis or infection, unspecified: Secondary | ICD-10-CM | POA: Diagnosis not present

## 2024-01-17 DIAGNOSIS — K861 Other chronic pancreatitis: Secondary | ICD-10-CM | POA: Diagnosis not present

## 2024-01-17 LAB — COMPREHENSIVE METABOLIC PANEL WITH GFR
ALT: 18 U/L (ref 0–44)
AST: 16 U/L (ref 15–41)
Albumin: 3.2 g/dL — ABNORMAL LOW (ref 3.5–5.0)
Alkaline Phosphatase: 62 U/L (ref 38–126)
Anion gap: 10 (ref 5–15)
BUN: 9 mg/dL (ref 6–20)
CO2: 28 mmol/L (ref 22–32)
Calcium: 8.9 mg/dL (ref 8.9–10.3)
Chloride: 104 mmol/L (ref 98–111)
Creatinine, Ser: 0.68 mg/dL (ref 0.61–1.24)
GFR, Estimated: >60 mL/min (ref >60)
Glucose, Bld: 106 mg/dL — ABNORMAL HIGH (ref 70–99)
Potassium: 3.9 mmol/L (ref 3.5–5.1)
Sodium: 142 mmol/L (ref 135–145)
Total Bilirubin: 0.4 mg/dL (ref 0.0–1.2)
Total Protein: 6.4 g/dL — ABNORMAL LOW (ref 6.5–8.1)

## 2024-01-17 LAB — PREALBUMIN: Prealbumin: 22 mg/dL (ref 18–38)

## 2024-01-17 LAB — GLUCOSE, CAPILLARY
Glucose-Capillary: 107 mg/dL — ABNORMAL HIGH (ref 70–99)
Glucose-Capillary: 113 mg/dL — ABNORMAL HIGH (ref 70–99)
Glucose-Capillary: 114 mg/dL — ABNORMAL HIGH (ref 70–99)
Glucose-Capillary: 114 mg/dL — ABNORMAL HIGH (ref 70–99)
Glucose-Capillary: 119 mg/dL — ABNORMAL HIGH (ref 70–99)
Glucose-Capillary: 121 mg/dL — ABNORMAL HIGH (ref 70–99)
Glucose-Capillary: 95 mg/dL (ref 70–99)

## 2024-01-17 LAB — PHOSPHORUS: Phosphorus: 3.8 mg/dL (ref 2.5–4.6)

## 2024-01-17 LAB — MAGNESIUM: Magnesium: 1.8 mg/dL (ref 1.7–2.4)

## 2024-01-17 LAB — PROTIME-INR
INR: 2.1 — ABNORMAL HIGH (ref 0.8–1.2)
Prothrombin Time: 24.3 s — ABNORMAL HIGH (ref 11.4–15.2)

## 2024-01-17 MED ORDER — WARFARIN SODIUM 4 MG PO TABS
4.0000 mg | ORAL_TABLET | Freq: Once | ORAL | Status: AC
  Administered 2024-01-17: 4 mg via ORAL
  Filled 2024-01-17: qty 1

## 2024-01-17 NOTE — Plan of Care (Signed)

## 2024-01-17 NOTE — Progress Notes (Signed)
 Increased Tube Feed rate from 39mL/hr to 31mL/hr, to see if pt can tolerate increase, per order.  No complaints from pt at this time.  Appears to be tolerating increased rate.  Pt now at goal.

## 2024-01-17 NOTE — Progress Notes (Signed)
 Daily Progress Note  DOA: 01/11/2024 Hospital Day: 7  Patient profile 33 year old male with a past medical history including but not limited to chronic pancreatitis complicated by pseudocyst, hypertension, mechanical valve replacement 2023 on chronic warfarin. Patient admitted with abdominal pain, acute on chronic pancreatitis    ASSESSMENT    Acute on chronic pancreatitis with partial gastric outlet obstruction and malnutrition TODAY>> IT IS HIS 33rd BIRTHDAY!!.  Clinically he is improving.  Tolerating postpyloric tube feeds (probably not yet at goal ) and some full liquids with nausea but no recurrent vomiting and not a significant increase in abdominal pain.  Prealbumin normal at 22 . has been ambulating  SIBO Recent positive breath test in the outpatient setting   History of aortic valve replacement On chronic warfarin   Principal Problem:   Chronic recurrent pancreatitis (HCC) Active Problems:   S/P AVR (aortic valve replacement) and aortoplasty   Subtherapeutic international normalized ratio (INR)   Essential hypertension   Pseudocyst of pancreas   On pre-exposure prophylaxis for HIV   History of asthma   History of alcohol abuse   Moderate malnutrition (HCC)   Protein-calorie malnutrition, severe   PLAN   --Continue postpyloric tube feeds -- He may decide to try a piece of homemade angel food cake that his aunt made for his birthday.  Currently on full liquids but can advance slowly as tolerated -- Continue frequent ambulation --Will treat SIBO at a later date --ERCP set up for 03/09/2024 at 3:30 PM at [facility] with Dr. Wilhelmenia   Subjective   Feels okay overall.  Tolerating a full liquid diet with some nausea / bloating but no vomiting or worsening abdominal pain.   Has been ambulating.  2 small BMs 01/15/2024.    Objective   GI Studies:    Recent Labs    01/16/24 0241  WBC 6.5  HGB 10.5*  HCT 32.0*  MCV 89.1  PLT 288   No results for  input(s): FOLATE, VITAMINB12, FERRITIN, TIBC, IRONPCTSAT in the last 72 hours. Recent Labs    01/16/24 0241 01/17/24 0225  NA 139 142  K 4.0 3.9  CL 102 104  CO2 28 28  GLUCOSE 108* 106*  BUN 9 9  CREATININE 0.72 0.68  CALCIUM  9.0 8.9   Recent Labs    01/16/24 0241 01/17/24 0225  PROT 6.5 6.4*  ALBUMIN  3.2* 3.2*  AST 19 16  ALT 18 18  ALKPHOS 57 62  BILITOT 0.2 0.4    Imaging:  DG Abd 1 View CLINICAL DATA:  Feeding tube adjustment in the radiology department.  EXAM: ABDOMEN - 1 VIEW  COMPARISON:  01/15/2024.  FINDINGS: Feeding tube terminates at the ligament of Treitz. Contrast was injected and position confirmed.  IMPRESSION: Satisfactory feeding tube position.  Electronically Signed   By: Newell Eke M.D.   On: 01/15/2024 14:57 DG Abd Portable 1V CLINICAL DATA:  Feeding tube placement  EXAM: PORTABLE ABDOMEN - 1 VIEW  COMPARISON:  None Available.  FINDINGS: Enteric tube tip terminates in gastric antrum. The bowel gas pattern is normal. No radio-opaque calculi or other significant radiographic abnormality are seen. Sternotomy wires identified.  IMPRESSION: Enteric tube tip in gastric antrum.  Electronically Signed   By: Megan  Zare M.D.   On: 01/15/2024 11:40     Scheduled inpatient medications:   dicyclomine   20 mg Oral TID AC & HS   feeding supplement (PROSource TF20)  60 mL Per Tube Daily   free  water   50 mL Per Tube Q2H   lipase/protease/amylase  72,000 Units Oral TID AC   loratadine   10 mg Oral Daily   metoprolol  succinate  12.5 mg Oral QHS   sodium chloride  flush  3 mL Intravenous Q12H   warfarin  4 mg Oral ONCE-1600   Warfarin - Pharmacist Dosing Inpatient   Does not apply q1600   Continuous inpatient infusions:   feeding supplement (OSMOLITE 1.5 CAL) 20 mL/hr at 01/16/24 0308   PRN inpatient medications: albuterol , hydrALAZINE , HYDROmorphone  (DILAUDID ) injection, hydrOXYzine , iohexol , ondansetron  **OR**  ondansetron  (ZOFRAN ) IV, oxyCODONE -acetaminophen , phenol  Vital signs in last 24 hours: Temp:  [97.6 F (36.4 C)-98.2 F (36.8 C)] 97.7 F (36.5 C) (08/08 1205) Pulse Rate:  [56-70] 61 (08/08 1205) Resp:  [18] 18 (08/08 1205) BP: (102-128)/(58-82) 117/74 (08/08 1205) SpO2:  [98 %-100 %] 100 % (08/08 1205) Weight:  [65.9 kg] 65.9 kg (08/08 0500) Last BM Date : 01/16/24  Intake/Output Summary (Last 24 hours) at 01/17/2024 1318 Last data filed at 01/17/2024 1124 Gross per 24 hour  Intake 550 ml  Output --  Net 550 ml    Intake/Output from previous day: 08/07 0701 - 08/08 0700 In: 550 [NG/GT:550] Out: -  Intake/Output this shift: Total I/O In: 100 [NG/GT:100] Out: -    Physical Exam:  General: Alert male in NAD.  Core track tube feeds in place at 40 mL an hour Heart:  Regular rate  Pulmonary: Normal respiratory effort.  Abdomen: Soft, non-distended, nontender, active bowel sounds . Extremities: No lower extremity edema  Neurologic: Alert and oriented Psych:  Cooperative     LOS: 6 days   Vina Dasen ,NP 01/17/2024, 1:18 PM

## 2024-01-17 NOTE — Progress Notes (Signed)
 PHARMACY - ANTICOAGULATION CONSULT NOTE  Pharmacy Consult for warfarin Indication: On-X mechanical AVR  Allergies  Allergen Reactions   Amoxicillin Hives   Patient Measurements: Height: 5' 11 (180.3 cm) Weight: 65.9 kg (145 lb 4.5 oz) IBW/kg (Calculated) : 75.3 HEPARIN  DW (KG): 71  Vital Signs: Temp: 97.8 F (36.6 C) (08/08 0739) Temp Source: Oral (08/08 0542) BP: 105/58 (08/08 0739) Pulse Rate: 56 (08/08 0739)  Labs: Recent Labs    01/15/24 0332 01/16/24 0241 01/17/24 0225  HGB  --  10.5*  --   HCT  --  32.0*  --   PLT  --  288  --   LABPROT 26.5* 30.2* 24.3*  INR 2.3* 2.7* 2.1*  CREATININE  --  0.72 0.68    Estimated Creatinine Clearance: 122.4 mL/min (by C-G formula based on SCr of 0.68 mg/dL).   Medical History: Past Medical History:  Diagnosis Date   Asthma    Headache    migraines   Heart murmur    Hypokalemia 06/08/2020   Leukocytosis 01/23/2022   Mild aortic stenosis    Pancreatitis    Pneumonia    as a child x several   Substance abuse [facility])    Assessment: 16 you male presents with abdominal pain found to have pancreatitis. He is on warfarin for an On-X mechanical AVR placed in 09/2021. Prior to admission, on warfarin for 4.5mg  everyday except 6mg  on MW (last dose 01/10/2024). Pharmacy consulted to dose warfarin.  Date INR Warfarin Dose  8/2 1.0, subtherapeutic 6mg    8/3 0.9, subtherapeutic  6mg    8/4 2.2, supratherapeutic  4 mg  8/5 2.3, supratherapeutic 4 mg  8/6 2.3, supratherapeutic 4 mg  8/7 2.7, supratherapeutic None  8/8 2.1, supratherapeutic 4 mg   CBC stable and no overt s/sx of bleeding.  INR near goal after dose held 8/7.  Pt with continued reduced PO intake but tolerating TFs at 77ml/hr with plans for rate increase.  Drug-Drug interactions: None noted   Goal of Therapy:  INR 1.5-2.0 Monitor platelets by anticoagulation protocol: Yes   Plan:  Warfarin 4mg  x 1 today,  Daily INR, CBC, and monitoring for bleeding F/u new  drug-drug interactions   Thank you for allowing pharmacy to participate in this patient's care.  [employer], Pharm.D., BCPS Clinical Pharmacist Clinical phone for 01/17/2024 from 7:30-3:00 is [phone].  **Pharmacist phone directory can be found on [email or web address] listed under [facility].  01/17/2024 9:42 AM

## 2024-01-17 NOTE — Progress Notes (Signed)
 PROGRESS NOTE [name] [name]  FMW:[id number] DOB: 03-04-1991 DOA: 01/11/2024 PCP: Lucius Krabbe, NP  Brief Narrative/Hospital Course: 33 y.o. male with medical history significant of hypertension, bicuspid aortic valve with severe aortic insufficiency and moderate aortic stenosis s/p mechanical AV replacement on 09/27/2021 on chronic anticoagulation, recurrent pancreatitis, asthma, GERD, and prior history of alcohol abuse presents with abdominal pain found to have AKI pancreatitis with pseudocyst, GI was consulted and admitted for conservative management. Patient had multiple hospitalization for pancreatitis. 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 Continue pain management antiemetics, Bentyl  and FLD as tolerated. 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  SIBO: Patient had positive breath test as per GI who are planning for treatment once pancreatic resolves.  Bicuspid AV/Severe AI/mod AS s/p mechanical AVR and aortoplasty on 09/27/2021 with goal INR between 1.5-2 on Coumadin  /Aspirin  indefinitely:  INR therapeutic,  pharmacy dosing, check daily INR. Recent Labs  Lab 01/13/24 0438 01/14/24 0426 01/15/24 0332 01/16/24 0241 01/17/24 0225  INR 2.2*  2.3* 2.3* 2.7* 2.1*     Essential hypertension Well-controlled on metoprolol    Asthma Not wheezing. Cont  prn nebs   On preexposure prophylaxis for HIV PTA on Truvada prophylaxis but stopped about a week and a half PTA due to his symptoms. Last HIV test nonreactive 04/03/2023. Resume on discharge   History of alcohol abuse now in remission x 10 months: Encouraged cessation.   Normocytic anemia from chronic disease: Slightly low.  Stable.  Monitor   Severe malnutrition : Postpyloric core Trak tube feeding started -augment diet as below Nutrition Problem: Severe Malnutrition Etiology: vomiting, chronic illness, altered GI function Signs/Symptoms: moderate fat depletion, severe muscle depletion Interventions: Refer to RD note for recommendations   Mobility: Ambulatory at baseline  DVT prophylaxis: Coumadin  Code Status:   Code Status: Full Code Family Communication: plan of care discussed with patient at bedside. Patient status is: Remains hospitalized because of severity of illness Level of care: Telemetry Medical   Dispo: The patient is from: HOME            Anticipated disposition: TBD-pending GI clearance Objective: Vitals last 24 hrs: Vitals:   01/16/24 2231 01/17/24 0500 01/17/24 0542 01/17/24 0739  BP: 108/77  121/79 (!) 105/58  Pulse: 64  61 (!) 56  Resp:   18 18  Temp:   97.6 F (36.4 C) 97.8 F (36.6 C)  TempSrc:   Oral   SpO2:   99% 99%  Weight:  65.9 kg    Height:        Physical Examination: General exam: AAOX3, CORTRACK+ HEENT:Oral mucosa moist, Ear/Nose WNL grossly Respiratory system: Bilaterally clear BS,no use of accessory muscle Cardiovascular system: S1 & S2 +, No JVD. Gastrointestinal system: Abdomen soft nontender and nondistended Nervous System: Alert, awake, moving all extremities,and following commands. Extremities: LE edema  neg,distal peripheral pulses palpable and warm.  Skin: No rashes,no icterus. MSK: Normal muscle bulk,tone, power    Medications reviewed:  Scheduled Meds:  dicyclomine   20 mg Oral TID AC & HS   feeding supplement (PROSource TF20)  60 mL Per Tube Daily   free water   50 mL Per Tube Q2H   lipase/protease/amylase  72,000 Units Oral TID AC   loratadine   10 mg Oral Daily   metoprolol  succinate  12.5 mg Oral QHS   sodium chloride  flush  3 mL Intravenous Q12H   Warfarin - Pharmacist Dosing Inpatient   Does not apply q1600   Continuous Infusions:  feeding supplement (OSMOLITE 1.5 CAL) 20 mL/hr at 01/16/24 0308   Diet: Diet Order             Diet full liquid Room service appropriate? Yes; Fluid consistency: Thin  Diet effective now                    Data Reviewed: I have personally reviewed following labs and imaging studies ( see epic result tab) CBC: Recent Labs  Lab 01/11/24 0155 01/12/24 0216 01/14/24 0426 01/16/24 0241  WBC 12.3* 9.7 9.2 6.5  HGB 12.7* 12.7* 11.0* 10.5*  HCT 40.1 38.8* 33.7* 32.0*  MCV 90.5 88.2 89.6 89.1  PLT 302 268 236 288   CMP: Recent Labs  Lab 01/11/24 0155 01/12/24 0216 01/14/24 0426 01/15/24 1631 01/16/24 0241 01/17/24 0225  NA 138 137 139  --  139 142  K 4.0 4.2 3.9  --  4.0 3.9  CL 101 101 101  --  102 104  CO2 25 27 30   --  28 28  GLUCOSE 132* 119* 117*  --  108* 106*  BUN 9 6 8   --  9 9  CREATININE 0.77 0.60* 0.65  --  0.72 0.68  CALCIUM  9.3 9.0 8.8*  --  9.0 8.9  MG  --   --   --  1.8 1.7 1.8  PHOS  --   --   --  3.9 4.7* 3.8   GFR: Estimated Creatinine Clearance: 122.4 mL/min (by C-G formula based on SCr of 0.68 mg/dL). Recent Labs  Lab 01/11/24 0155 01/12/24 0216 01/14/24 0426 01/16/24 0241 01/17/24 0225  AST 27 18 15 19 16   ALT 25 20 15 18 18   ALKPHOS 73 64 57 57 62  BILITOT <0.2 0.6 0.4 0.2 0.4  PROT 7.3 6.5 6.2* 6.5 6.4*  ALBUMIN  4.3 3.6 3.1* 3.2* 3.2*    Recent Labs  Lab 01/11/24 0155  LIPASE 338*   No results for input(s): AMMONIA in the last 168 hours. Coagulation Profile:  Recent Labs  Lab 01/13/24 0438  01/14/24 0426 01/15/24 0332 01/16/24 0241 01/17/24 0225  INR 2.2* 2.3* 2.3* 2.7* 2.1*   Unresulted Labs (From admission, onward)     Start     Ordered   01/16/24 0500  Comprehensive metabolic panel with GFR  Daily,   R      01/15/24 0958   01/16/24 0500  Magnesium   Daily,   R      01/15/24 1540   01/16/24 0500  Phosphorus  Daily,   R      01/15/24 1540   01/12/24 0500  Protime-INR  Daily,   R      01/11/24 9072           Antimicrobials/Microbiology: Anti-infectives (From admission, onward)    None      No results found for: SDES,  SPECREQUEST, CULT, REPTSTATUS  Procedures:  Mennie LAMY, MD [facility] 01/17/2024, 10:41 AM

## 2024-01-18 DIAGNOSIS — K859 Acute pancreatitis without necrosis or infection, unspecified: Secondary | ICD-10-CM | POA: Diagnosis not present

## 2024-01-18 DIAGNOSIS — K311 Adult hypertrophic pyloric stenosis: Secondary | ICD-10-CM | POA: Diagnosis not present

## 2024-01-18 DIAGNOSIS — K638219 Small intestinal bacterial overgrowth, unspecified: Secondary | ICD-10-CM

## 2024-01-18 DIAGNOSIS — K861 Other chronic pancreatitis: Secondary | ICD-10-CM | POA: Diagnosis not present

## 2024-01-18 DIAGNOSIS — K8681 Exocrine pancreatic insufficiency: Secondary | ICD-10-CM | POA: Diagnosis not present

## 2024-01-18 LAB — COMPREHENSIVE METABOLIC PANEL WITH GFR
ALT: 17 U/L (ref 0–44)
AST: 16 U/L (ref 15–41)
Albumin: 3.3 g/dL — ABNORMAL LOW (ref 3.5–5.0)
Alkaline Phosphatase: 68 U/L (ref 38–126)
Anion gap: 4 — ABNORMAL LOW (ref 5–15)
BUN: 9 mg/dL (ref 6–20)
CO2: 28 mmol/L (ref 22–32)
Calcium: 8.9 mg/dL (ref 8.9–10.3)
Chloride: 105 mmol/L (ref 98–111)
Creatinine, Ser: 0.64 mg/dL (ref 0.61–1.24)
GFR, Estimated: >60 mL/min (ref >60)
Glucose, Bld: 110 mg/dL — ABNORMAL HIGH (ref 70–99)
Potassium: 4.5 mmol/L (ref 3.5–5.1)
Sodium: 137 mmol/L (ref 135–145)
Total Bilirubin: <0.2 mg/dL (ref 0.0–1.2)
Total Protein: 6.5 g/dL (ref 6.5–8.1)

## 2024-01-18 LAB — PROTIME-INR
INR: 1.2 (ref 0.8–1.2)
Prothrombin Time: 16.1 s — ABNORMAL HIGH (ref 11.4–15.2)

## 2024-01-18 LAB — GLUCOSE, CAPILLARY
Glucose-Capillary: 112 mg/dL — ABNORMAL HIGH (ref 70–99)
Glucose-Capillary: 90 mg/dL (ref 70–99)
Glucose-Capillary: 97 mg/dL (ref 70–99)
Glucose-Capillary: 105 mg/dL — ABNORMAL HIGH (ref 70–99)
Glucose-Capillary: 121 mg/dL — ABNORMAL HIGH (ref 70–99)

## 2024-01-18 LAB — MAGNESIUM: Magnesium: 2.1 mg/dL (ref 1.7–2.4)

## 2024-01-18 LAB — PHOSPHORUS: Phosphorus: 4.3 mg/dL (ref 2.5–4.6)

## 2024-01-18 MED ORDER — ASPIRIN 81 MG PO TBEC
81.0000 mg | DELAYED_RELEASE_TABLET | Freq: Every day | ORAL | Status: DC
  Administered 2024-01-18 – 2024-02-11 (×28): 81 mg via ORAL
  Filled 2024-01-18 (×26): qty 1

## 2024-01-18 MED ORDER — RIFAXIMIN 550 MG PO TABS
550.0000 mg | ORAL_TABLET | Freq: Three times a day (TID) | ORAL | Status: DC
  Administered 2024-01-18 – 2024-01-26 (×34): 550 mg via ORAL
  Filled 2024-01-18 (×28): qty 1

## 2024-01-18 MED ORDER — WARFARIN SODIUM 5 MG PO TABS
5.0000 mg | ORAL_TABLET | Freq: Once | ORAL | Status: AC
  Administered 2024-01-18: 5 mg via ORAL
  Filled 2024-01-18: qty 1

## 2024-01-18 NOTE — Progress Notes (Addendum)
 PHARMACY - ANTICOAGULATION CONSULT NOTE  Pharmacy Consult for warfarin Indication: On-X mechanical AVR  Allergies  Allergen Reactions   Amoxicillin Hives    Patient Measurements: Height: 5' 11 (180.3 cm) Weight: 66.2 kg (145 lb 15.1 oz) IBW/kg (Calculated) : 75.3 HEPARIN  DW (KG): 71  Vital Signs: Temp: 98 F (36.7 C) (08/09 0342) Temp Source: Oral (08/09 0342) BP: 102/60 (08/09 0342) Pulse Rate: 66 (08/09 0342)  Labs: Recent Labs    01/16/24 0241 01/17/24 0225 01/18/24 0224  HGB 10.5*  --   --   HCT 32.0*  --   --   PLT 288  --   --   LABPROT 30.2* 24.3* 16.1*  INR 2.7* 2.1* 1.2  CREATININE 0.72 0.68 0.64    Estimated Creatinine Clearance: 123 mL/min (by C-G formula based on SCr of 0.64 mg/dL).   Medical History: Past Medical History:  Diagnosis Date   Asthma    Headache    migraines   Heart murmur    Hypokalemia 06/08/2020   Leukocytosis 01/23/2022   Mild aortic stenosis    Pancreatitis    Pneumonia    as a child x several   Substance abuse (HCC)     Medications:  Scheduled:   dicyclomine   20 mg Oral TID AC & HS   feeding supplement (PROSource TF20)  60 mL Per Tube Daily   free water   50 mL Per Tube Q2H   lipase/protease/amylase  72,000 Units Oral TID AC   loratadine   10 mg Oral Daily   metoprolol  succinate  12.5 mg Oral QHS   sodium chloride  flush  3 mL Intravenous Q12H   Warfarin - Pharmacist Dosing Inpatient   Does not apply q1600    Assessment: 33 yo M presenting with pancreatitis. PTA regimen is 6mg  Monday and Thursday and 4.5 mg all other days. INR is slightly subtherapeutic at 1.2. Of note, dose was held 8/7 in setting of elevated INR. Pt is on tube feeds, but no PO intake otherwise. MD inquired about potentially loading the patient, but given a single subtherapeutic INR after having a dose held and a previous supratherapeutic, I recommended increasing warfarin and deferring a bridge. Cardiology was involved in the conversation and agrees.  Will provide slight dose increase in setting of lower INR as INR drop is likely due to held dose, pt has poor oral intake, and narrow therapeutic goal. Team started rifaximin  given patient's presentation, which may decrease INR. Will continue to monitor.   Goal of Therapy:  INR 1.5-2 Monitor platelets by anticoagulation protocol: Yes   Plan:  Warfarin 5mg  PO x1 at 1600 Monitor INR daily Monitor for drug interactions  Elma Fail, PharmD PGY1 Clinical Pharmacist [facility]  01/18/2024 8:30 AM

## 2024-01-18 NOTE — Plan of Care (Signed)
   Problem: Education: Goal: Knowledge of General Education information will improve Description Including pain rating scale, medication(s)/side effects and non-pharmacologic comfort measures Outcome: Progressing

## 2024-01-18 NOTE — Progress Notes (Signed)
 PROGRESS NOTE [name] [name]  FMW:[id number] DOB: 07-10-1990 DOA: 01/11/2024 PCP: Lucius Krabbe, NP   Brief Narrative/Hospital Course: 33 y.o. male with medical history significant of hypertension, bicuspid aortic valve with severe aortic insufficiency and moderate aortic stenosis s/p mechanical AV replacement on 09/27/2021 on chronic anticoagulation, recurrent pancreatitis, asthma, GERD, and prior history of alcohol abuse presents with abdominal pain found to have AKI pancreatitis with pseudocyst, GI was consulted and admitted for conservative management. Patient had multiple hospitalization for pancreatitis. 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  SIBO: Patient had positive breath test as per GI who are planning for treatment once pancreatic resolves.  Bicuspid AV/Severe AI/mod AS s/p Mechanical AVR/aortoplasty 09/2021 with goal INR 1.5-2 on Coumadin  /Aspirin  indefinitely: INR ranging as below pharmacy dosing, discussed with Dr. Waddell 01/18/24 am from cardiology do not advise to bridge for now given pancreatitis risk of  hemorrhagic conversion-if remains persistently subtherapeutic will consider bridging Recent Labs  Lab 01/14/24 0426 01/15/24 0332 01/16/24 0241 01/17/24 0225 01/18/24 0224  INR 2.3* 2.3* 2.7* 2.1* 1.2     Essential hypertension Well-controlled on metoprolol    Asthma Not wheezing. Cont  prn nebs   On preexposure prophylaxis for HIV PTA on Truvada prophylaxis but stopped about a week and a half PTA due to his symptoms. Last HIV test nonreactive 04/03/2023. Resume on discharge   History of alcohol abuse now in remission x 10 months: Encouraged cessation.   Normocytic anemia from chronic disease: Slightly low.  Stable.  Monitor   Severe malnutrition : Postpyloric core Trak tube feeding started -augment diet as below Nutrition Problem: Severe Malnutrition Etiology: vomiting, chronic illness, altered GI function Signs/Symptoms: moderate fat depletion, severe muscle depletion Interventions: Refer to RD note for recommendations   Mobility: Ambulatory at baseline  DVT prophylaxis: Coumadin  Code Status:   Code Status: Full Code Family Communication: plan of care discussed with patient at bedside. Patient status is: Remains hospitalized because of severity of illness Level of care: Telemetry Medical   Dispo: The patient is from: HOME            Anticipated disposition: TBD-pending GI clearance Objective: Vitals last 24 hrs: Vitals:   01/17/24 2351 01/18/24 0342 01/18/24 0500 01/18/24 0834  BP: 116/73 102/60  97/66  Pulse: 65 66  60  Resp: 20 20  17   Temp: 97.7 F (36.5 C) 98 F (36.7 C)  97.6 F (36.4 C)  TempSrc: Oral Oral    SpO2: 99% 99%  100%  Weight:   66.2 kg   Height:        Physical Examination: General exam: AAO, pleasant , CORTRACK NGT + HEENT:Oral mucosa moist, Ear/Nose WNL grossly Respiratory system:  Bilaterally clear BS,no use of accessory muscle Cardiovascular system: S1 & S2 +, No JVD. Gastrointestinal system: Abdomen Soft NT ND, BS+ Nervous  System: Alert, awake, moving all extremities,and following commands. Extremities: LE edema neg,distal peripheral pulses palpable and warm.  Skin: No rashes,no icterus. MSK: Normal muscle bulk,tone, power   Medications reviewed:  Scheduled Meds:  aspirin  EC  81 mg Oral Daily   dicyclomine   20 mg Oral TID AC & HS   feeding supplement (PROSource TF20)  60 mL Per Tube Daily   free water   50 mL Per Tube Q2H   lipase/protease/amylase  72,000 Units Oral TID AC   loratadine   10 mg Oral Daily   metoprolol  succinate  12.5 mg Oral QHS   sodium chloride  flush  3 mL Intravenous Q12H   warfarin  5 mg Oral ONCE-1600   Warfarin - Pharmacist Dosing Inpatient   Does not apply q1600   Continuous Infusions:  feeding supplement (OSMOLITE 1.5 CAL) 1,000 mL (01/17/24 1448)   Diet: Diet Order             Diet full liquid Room service appropriate? Yes; Fluid consistency: Thin  Diet effective now                    Data Reviewed: I have personally reviewed following labs and imaging studies ( see epic result tab) CBC: Recent Labs  Lab 01/12/24 0216 01/14/24 0426 01/16/24 0241  WBC 9.7 9.2 6.5  HGB 12.7* 11.0* 10.5*  HCT 38.8* 33.7* 32.0*  MCV 88.2 89.6 89.1  PLT 268 236 288   CMP: Recent Labs  Lab 01/12/24 0216 01/14/24 0426 01/15/24 1631 01/16/24 0241 01/17/24 0225 01/18/24 0224  NA 137 139  --  139 142 137  K 4.2 3.9  --  4.0 3.9 4.5  CL 101 101  --  102 104 105  CO2 27 30  --  28 28 28   GLUCOSE 119* 117*  --  108* 106* 110*  BUN 6 8  --  9 9 9   CREATININE 0.60* 0.65  --  0.72 0.68 0.64  CALCIUM  9.0 8.8*  --  9.0 8.9 8.9  MG  --   --  1.8 1.7 1.8 2.1  PHOS  --   --  3.9 4.7* 3.8 4.3   GFR: Estimated Creatinine Clearance: 123 mL/min (by C-G formula based on SCr of 0.64 mg/dL). Recent Labs  Lab 01/12/24 0216 01/14/24 0426 01/16/24 0241 01/17/24 0225 01/18/24 0224  AST 18 15 19 16 16   ALT 20 15 18 18 17   ALKPHOS 64 57 57 62 68  BILITOT 0.6 0.4 0.2 0.4 <0.2  PROT 6.5  6.2* 6.5 6.4* 6.5  ALBUMIN  3.6 3.1* 3.2* 3.2* 3.3*    No results for input(s): LIPASE, AMYLASE in the last 168 hours.  No results for input(s): AMMONIA in the last 168 hours. Coagulation Profile:  Recent Labs  Lab 01/14/24 0426 01/15/24 0332 01/16/24 0241 01/17/24 0225 01/18/24 0224  INR 2.3* 2.3* 2.7* 2.1* 1.2   Unresulted Labs (From admission, onward)     Start     Ordered   01/16/24 0500  Comprehensive metabolic panel with GFR  Daily,   R      01/15/24 0958   01/16/24 0500  Magnesium   Daily,   R      01/15/24 1540   01/16/24 0500  Phosphorus  Daily,   R      01/15/24 1540   01/12/24 0500  Protime-INR  Daily,   R      01/11/24 9072           Antimicrobials/Microbiology: Anti-infectives (From admission, onward)    None      No results found for: SDES, SPECREQUEST, CULT, REPTSTATUS  Procedures:  Mennie LAMY, MD [facility] 01/18/2024, 10:37 AM

## 2024-01-18 NOTE — Plan of Care (Signed)

## 2024-01-18 NOTE — Progress Notes (Signed)
    Progress Note   Assessment    33 year old male with chronic pancreatitis complicated by pseudocyst, hypertension, prior mechanical aortic valve replacement on warfarin, presenting with acute on chronic pancreatitis with functional/partial gastric outlet obstruction and malnutrition.  Also recent diagnosis of SIBO by breath test.  Principal Problem:   Chronic recurrent pancreatitis (HCC) Active Problems:   S/P AVR (aortic valve replacement) and aortoplasty   Subtherapeutic international normalized ratio (INR)   Essential hypertension   Pseudocyst of pancreas   On pre-exposure prophylaxis for HIV   History of asthma   History of alcohol abuse   Moderate malnutrition (HCC)   Protein-calorie malnutrition, severe   Recommendations   1.  Acute on chronic pancreatitis with partial gastric outlet obstruction and malnutrition --clinically improving but not able to eat enough food by mouth to support caloric needs.  We will advance to soft diet and see how he does.  I am hesitant to take out the feeding tube until he can at least near his caloric goal per day -- Continue tube feeds -- Advance to soft diet and monitor response -- Pain and nausea control as needed -- Long discussion about protein and calorie intake at home.  He can eat smoothies and use protein powder PB 2 powder to help supplement protein intake ERCP scheduled for 03/09/2024 at [facility] with Dr. Wilhelmenia--  2 SIBO --frequent gas and bloating --Initiate therapy here with rifaximin  550 mg 3 times daily x 14 days; to start today; will need the balance of therapy prescribed at discharge   Chief Complaint   Patient tolerating tube feeds at goal 60 cc an hour On full liquid diet and having abdominal pressure, belching and mild nausea and mild increase in pain after eating but no further vomiting Pain level 3-4 out of 10 which he feels is very good Father at bedside from [facility] No bowel movement x 2 days  Vital  signs in last 24 hours: Temp:  [97.6 F (36.4 C)-98.1 F (36.7 C)] 97.6 F (36.4 C) (08/09 0834) Pulse Rate:  [60-77] 60 (08/09 0834) Resp:  [17-20] 17 (08/09 0834) BP: (97-117)/(60-74) 97/66 (08/09 0834) SpO2:  [98 %-100 %] 100 % (08/09 0834) Weight:  [66.2 kg] 66.2 kg (08/09 0500) Last BM Date : 01/16/24 Gen: awake, alert, NAD HEENT: anicteric, nasoenteric tube in place Neuro: nonfocal   Intake/Output from previous day: 08/08 0701 - 08/09 0700 In: 550 [NG/GT:550] Out: -  Intake/Output this shift: Total I/O In: 100 [NG/GT:100] Out: -   Lab Results: Recent Labs    01/16/24 0241  WBC 6.5  HGB 10.5*  HCT 32.0*  PLT 288   BMET Recent Labs    01/16/24 0241 01/17/24 0225 01/18/24 0224  NA 139 142 137  K 4.0 3.9 4.5  CL 102 104 105  CO2 28 28 28   GLUCOSE 108* 106* 110*  BUN 9 9 9   CREATININE 0.72 0.68 0.64  CALCIUM  9.0 8.9 8.9   LFT Recent Labs    01/18/24 0224  PROT 6.5  ALBUMIN  3.3*  AST 16  ALT 17  ALKPHOS 68  BILITOT <0.2   PT/INR Recent Labs    01/17/24 0225 01/18/24 0224  LABPROT 24.3* 16.1*  INR 2.1* 1.2      LOS: 7 days   Gordy CHRISTELLA Starch, MD 01/18/2024, 11:57 AM See TRACEY, [facility], to contact our on call provider

## 2024-01-19 DIAGNOSIS — K8681 Exocrine pancreatic insufficiency: Secondary | ICD-10-CM | POA: Diagnosis not present

## 2024-01-19 DIAGNOSIS — K861 Other chronic pancreatitis: Secondary | ICD-10-CM | POA: Diagnosis not present

## 2024-01-19 DIAGNOSIS — K859 Acute pancreatitis without necrosis or infection, unspecified: Secondary | ICD-10-CM | POA: Diagnosis not present

## 2024-01-19 DIAGNOSIS — K311 Adult hypertrophic pyloric stenosis: Secondary | ICD-10-CM | POA: Diagnosis not present

## 2024-01-19 LAB — COMPREHENSIVE METABOLIC PANEL WITH GFR
ALT: 19 U/L (ref 0–44)
AST: 18 U/L (ref 15–41)
Albumin: 4.2 g/dL (ref 3.5–5.0)
Alkaline Phosphatase: 87 U/L (ref 38–126)
Anion gap: 10 (ref 5–15)
BUN: 15 mg/dL (ref 6–20)
CO2: 28 mmol/L (ref 22–32)
Calcium: 9.7 mg/dL (ref 8.9–10.3)
Chloride: 102 mmol/L (ref 98–111)
Creatinine, Ser: 0.69 mg/dL (ref 0.61–1.24)
GFR, Estimated: >60 mL/min (ref >60)
Glucose, Bld: 103 mg/dL — ABNORMAL HIGH (ref 70–99)
Potassium: 4.6 mmol/L (ref 3.5–5.1)
Sodium: 140 mmol/L (ref 135–145)
Total Bilirubin: 0.4 mg/dL (ref 0.0–1.2)
Total Protein: 7.8 g/dL (ref 6.5–8.1)

## 2024-01-19 LAB — MAGNESIUM: Magnesium: 2.1 mg/dL (ref 1.7–2.4)

## 2024-01-19 LAB — GLUCOSE, CAPILLARY
Glucose-Capillary: 108 mg/dL — ABNORMAL HIGH (ref 70–99)
Glucose-Capillary: 111 mg/dL — ABNORMAL HIGH (ref 70–99)
Glucose-Capillary: 115 mg/dL — ABNORMAL HIGH (ref 70–99)
Glucose-Capillary: 123 mg/dL — ABNORMAL HIGH (ref 70–99)
Glucose-Capillary: 123 mg/dL — ABNORMAL HIGH (ref 70–99)
Glucose-Capillary: 89 mg/dL (ref 70–99)

## 2024-01-19 LAB — PROTIME-INR
INR: 1.1 (ref 0.8–1.2)
Prothrombin Time: 14.6 s (ref 11.4–15.2)

## 2024-01-19 LAB — PHOSPHORUS: Phosphorus: 4.7 mg/dL — ABNORMAL HIGH (ref 2.5–4.6)

## 2024-01-19 MED ORDER — ALUM & MAG HYDROXIDE-SIMETH 200-200-20 MG/5ML PO SUSP
30.0000 mL | Freq: Four times a day (QID) | ORAL | Status: DC | PRN
  Administered 2024-01-19 – 2024-02-02 (×5): 30 mL via ORAL
  Filled 2024-01-19 (×5): qty 30

## 2024-01-19 MED ORDER — WARFARIN SODIUM 10 MG PO TABS: 10.0000 mg | ORAL_TABLET | Freq: Once | ORAL | Status: DC

## 2024-01-19 MED ORDER — WARFARIN SODIUM 6 MG PO TABS
6.0000 mg | ORAL_TABLET | Freq: Once | ORAL | Status: AC
  Administered 2024-01-19: 6 mg via ORAL
  Filled 2024-01-19: qty 1

## 2024-01-19 MED ORDER — ENOXAPARIN SODIUM 60 MG/0.6ML IJ SOSY
60.0000 mg | PREFILLED_SYRINGE | Freq: Two times a day (BID) | INTRAMUSCULAR | Status: DC
  Administered 2024-01-19 – 2024-01-21 (×10): 60 mg via SUBCUTANEOUS
  Filled 2024-01-19 (×6): qty 0.6

## 2024-01-19 NOTE — Progress Notes (Signed)
 Daily Progress Note  DOA: 01/11/2024 Hospital Day: 9  Cc: Acute on chronic pancreatitis  ASSESSMENT    Patient Profile: 33 year old male with a past medical history including but not limited to chronic pancreatitis complicated by pseudocyst, hypertension, mechanical valve replacement 2023 on chronic warfarin. Patient admitted with abdominal pain, acute on chronic pancreatitis   Acute on chronic pancreatitis with partial gastric outlet obstruction and malnutrition TODAY: Mildly elevated phosphorus, CMP otherwise unremarkable. Except for gas and mild abdominal discomfort he is tolerating a soft diet.   SIBO Recent positive breath test in the outpatient setting. /Treated started 8/9   History of aortic valve replacement On chronic warfarin    Principal Problem:   Chronic recurrent pancreatitis (HCC) Active Problems:   S/P AVR (aortic valve replacement) and aortoplasty   Subtherapeutic international normalized ratio (INR)   Essential hypertension   Pseudocyst of pancreas   On pre-exposure prophylaxis for HIV   History of asthma   History of alcohol abuse   Moderate malnutrition (HCC)   Protein-calorie malnutrition, severe   Small intestinal bacterial overgrowth (SIBO)    PLAN   --Continue postpyloric tube feeding --Continue soft diet --Continue Creon  72K 3 times daily the before meals --Continues rifaximin  550 mg 3 times daily x 14 days for SIBO ( on day #2) --ERCP set up for 03/09/2024 at 3:30 PM at [facility] with Dr. Wilhelmenia     Subjective   Tolerating a soft diet with only gas and a mild increase in baseline abdominal pain.   His father is visiting from [place]    Objective   GI Studies:   Recent Labs    01/17/24 0225 01/18/24 0224 01/19/24 0542  NA 142 137 140  K 3.9 4.5 4.6  CL 104 105 102  CO2 28 28 28   GLUCOSE 106* 110* 103*  BUN 9 9 15   CREATININE 0.68 0.64 0.69  CALCIUM  8.9 8.9 9.7   Recent Labs    01/17/24 0225 01/18/24 0224  01/19/24 0542  PROT 6.4* 6.5 7.8  ALBUMIN  3.2* 3.3* 4.2  AST 16 16 18   ALT 18 17 19   ALKPHOS 62 68 87  BILITOT 0.4 <0.2 0.4      Imaging:  DG Abd 1 View CLINICAL DATA:  Feeding tube adjustment in the radiology department.  EXAM: ABDOMEN - 1 VIEW  COMPARISON:  01/15/2024.  FINDINGS: Feeding tube terminates at the ligament of Treitz. Contrast was injected and position confirmed.  IMPRESSION: Satisfactory feeding tube position.  Electronically Signed   By: Newell Eke M.D.   On: 01/15/2024 14:57 DG Abd Portable 1V CLINICAL DATA:  Feeding tube placement  EXAM: PORTABLE ABDOMEN - 1 VIEW  COMPARISON:  None Available.  FINDINGS: Enteric tube tip terminates in gastric antrum. The bowel gas pattern is normal. No radio-opaque calculi or other significant radiographic abnormality are seen. Sternotomy wires identified.  IMPRESSION: Enteric tube tip in gastric antrum.  Electronically Signed   By: Megan  Zare M.D.   On: 01/15/2024 11:40     Scheduled inpatient medications:   aspirin  EC  81 mg Oral Daily   dicyclomine   20 mg Oral TID AC & HS   enoxaparin  (LOVENOX ) injection  60 mg Subcutaneous Q12H   feeding supplement (PROSource TF20)  60 mL Per Tube Daily   free water   50 mL Per Tube Q2H   lipase/protease/amylase  72,000 Units Oral TID AC   loratadine   10 mg Oral Daily   metoprolol  succinate  12.5 mg  Oral QHS   rifaximin   550 mg Oral TID   sodium chloride  flush  3 mL Intravenous Q12H   warfarin  6 mg Oral ONCE-1600   Warfarin - Pharmacist Dosing Inpatient   Does not apply q1600   Continuous inpatient infusions:   feeding supplement (OSMOLITE 1.5 CAL) 1,000 mL (01/19/24 0537)   PRN inpatient medications: albuterol , hydrALAZINE , HYDROmorphone  (DILAUDID ) injection, hydrOXYzine , iohexol , ondansetron  **OR** ondansetron  (ZOFRAN ) IV, oxyCODONE -acetaminophen , phenol  Vital signs in last 24 hours: Temp:  [97.3 F (36.3 C)-98.1 F (36.7 C)] 97.6 F (36.4 C)  (08/10 1213) Pulse Rate:  [57-79] 70 (08/10 1213) Resp:  [16-20] 17 (08/10 1213) BP: (102-118)/(68-79) 113/79 (08/10 1213) SpO2:  [97 %-100 %] 100 % (08/10 1213) Weight:  [66.3 kg] 66.3 kg (08/10 0500) Last BM Date : 01/16/24  Intake/Output Summary (Last 24 hours) at 01/19/2024 1222 Last data filed at 01/19/2024 1208 Gross per 24 hour  Intake 905 ml  Output --  Net 905 ml    Intake/Output from previous day: 08/09 0701 - 08/10 0700 In: 550 [NG/GT:550] Out: -  Intake/Output this shift: Total I/O In: 455 [P.O.:355; NG/GT:100] Out: -    Physical Exam:  General: Alert male in NAD Heart:  Regular rate and rhythm.  Pulmonary: Normal respiratory effort Abdomen: Soft, nondistended, nontender. Normal bowel sounds. Extremities: No lower extremity edema  Neurologic: Alert and oriented Psych: Pleasant. Cooperative     LOS: 8 days   Vina Dasen ,NP 01/19/2024, 12:22 PM

## 2024-01-19 NOTE — Progress Notes (Signed)
 PROGRESS NOTE [name] [name]  FMW:[id number] DOB: February 25, 1991 DOA: 01/11/2024 PCP: Lucius Krabbe, NP   Brief Narrative/Hospital Course: 33 y.o. male with medical history significant of hypertension, bicuspid aortic valve with severe aortic insufficiency and moderate aortic stenosis s/p mechanical AV replacement on 09/27/2021 on chronic anticoagulation, recurrent pancreatitis, asthma, GERD, and prior history of alcohol abuse presents with abdominal pain found to have AKI pancreatitis with pseudocyst, GI was consulted and admitted for conservative management. Patient had multiple hospitalization for pancreatitis. 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 Monitor labs for refeeding syndrome, encourage ambulation has mutation on CFTR gene and now being referred to medical genetics outpatient by GI-he is set up for ERCP on 03/09/24 as OP at [facility].  SIBO: Patient had positive breath test as per GI who are planning for treatment once pancreatic resolves.  Bicuspid AV/Severe AI/mod AS s/p Mechanical AVR/aortoplasty 09/2021 with goal INR 1.5-2 on Coumadin  /Aspirin  indefinitely: INR remains subtherapeutic INR today  discussed with cardiologist Dr. CHRISTELLA, pharmacy and Lovenox  bridge today, cont to dose Coumadin  and monitor INR as below Recent Labs  Lab 01/15/24 0332 01/16/24 0241 01/17/24 0225 01/18/24 0224 01/19/24 0542  INR 2.3* 2.7* 2.1* 1.2 1.1     Essential hypertension Well-controlled on metoprolol    Asthma Not wheezing. Cont  prn nebs   On preexposure prophylaxis for HIV PTA on Truvada prophylaxis but stopped about a week and a half PTA due to his symptoms. Last HIV test nonreactive 04/03/2023. Resume on discharge   History of alcohol abuse now in remission x 10 months: Encouraged cessation.   Normocytic anemia from chronic disease: Slightly low.  Stable.  Monitor   Severe malnutrition : Postpyloric core Trak tube feeding started -augment diet as below Nutrition Problem: Severe Malnutrition Etiology: vomiting, chronic illness, altered GI function Signs/Symptoms: moderate fat depletion, severe muscle depletion Interventions: Refer to RD note for recommendations   Mobility: Ambulatory at baseline  DVT prophylaxis: Coumadin  Code Status:   Code Status: Full Code Family Communication: plan of care discussed with patient at bedside. Patient status is: Remains hospitalized because of severity of illness Level of care: Telemetry Medical   Dispo: The patient is from: HOME            Anticipated disposition: TBD-pending GI clearance Objective: Vitals last 24 hrs: Vitals:   01/19/24 0036 01/19/24 0428 01/19/24 0500 01/19/24 0802  BP: 111/69 111/68  118/79  Pulse: 64 (!) 57  63  Resp: 20 16  17   Temp: 98 F (36.7 C) (!) 97.3 F (36.3 C)  98.1 F (36.7 C)  TempSrc:      SpO2: 99% 99%  100%  Weight:   66.3 kg   Height:        Physical Examination: General exam: Alert and oriented x 3 with , cortak+ HEENT:Oral  mucosa moist, Ear/Nose WNL grossly Respiratory system: No tachypnea  Cardiovascular system: S1 & S2 +, No JVD. Gastrointestinal system: Abdomen Soft NT ND, BS+ Nervous  System: Alert, awake, moving all extremities,and following commands. Extremities: LE edema neg,distal peripheral pulses palpable and warm.  Skin: No rashes,no icterus. MSK: Normal muscle bulk,tone, power   Medications reviewed:  Scheduled Meds:  aspirin  EC  81 mg Oral Daily   dicyclomine   20 mg Oral TID AC & HS   enoxaparin  (LOVENOX ) injection  60 mg Subcutaneous Q12H   feeding supplement (PROSource TF20)  60 mL Per Tube Daily   free water   50 mL Per Tube Q2H   lipase/protease/amylase  72,000 Units Oral TID AC   loratadine   10 mg Oral Daily   metoprolol  succinate  12.5 mg Oral QHS   rifaximin   550 mg Oral TID   sodium chloride  flush  3 mL Intravenous Q12H   warfarin  6 mg Oral ONCE-1600   Warfarin - Pharmacist Dosing Inpatient   Does not apply q1600   Continuous Infusions:  feeding supplement (OSMOLITE 1.5 CAL) 1,000 mL (01/19/24 0537)   Diet: Diet Order             DIET SOFT Room service appropriate? Yes; Fluid consistency: Thin  Diet effective now                    Data Reviewed: I have personally reviewed following labs and imaging studies ( see epic result tab) CBC: Recent Labs  Lab 01/14/24 0426 01/16/24 0241  WBC 9.2 6.5  HGB 11.0* 10.5*  HCT 33.7* 32.0*  MCV 89.6 89.1  PLT 236 288   CMP: Recent Labs  Lab 01/14/24 0426 01/15/24 1631 01/16/24 0241 01/17/24 0225 01/18/24 0224 01/19/24 0542  NA 139  --  139 142 137 140  K 3.9  --  4.0 3.9 4.5 4.6  CL 101  --  102 104 105 102  CO2 30  --  28 28 28 28   GLUCOSE 117*  --  108* 106* 110* 103*  BUN 8  --  9 9 9 15   CREATININE 0.65  --  0.72 0.68 0.64 0.69  CALCIUM  8.8*  --  9.0 8.9 8.9 9.7  MG  --  1.8 1.7 1.8 2.1 2.1  PHOS  --  3.9 4.7* 3.8 4.3 4.7*   GFR: Estimated Creatinine Clearance: 123.2 mL/min (by C-G formula based on SCr of 0.69 mg/dL). Recent Labs  Lab 01/14/24 0426 01/16/24 0241 01/17/24 0225 01/18/24 0224 01/19/24 0542  AST 15 19 16 16 18   ALT 15 18 18 17 19   ALKPHOS 57 57 62 68 87   BILITOT 0.4 0.2 0.4 <0.2 0.4  PROT 6.2* 6.5 6.4* 6.5 7.8  ALBUMIN  3.1* 3.2* 3.2* 3.3* 4.2    No results for input(s): LIPASE, AMYLASE in the last 168 hours.  No results for input(s): AMMONIA in the last 168 hours. Coagulation Profile:  Recent Labs  Lab 01/15/24 0332 01/16/24 0241 01/17/24 0225 01/18/24 0224 01/19/24 0542  INR 2.3* 2.7* 2.1* 1.2 1.1   Unresulted Labs (From admission, onward)     Start     Ordered   01/16/24 0500  Comprehensive metabolic panel with GFR  Daily,   R      01/15/24 0958   01/12/24 0500  Protime-INR  Daily,   R      01/11/24 9072           Antimicrobials/Microbiology: Anti-infectives (From admission, onward)  Start     Dose/Rate Route Frequency Ordered Stop   01/18/24 1230  rifaximin  (XIFAXAN ) tablet 550 mg        550 mg Oral 3 times daily 01/18/24 1132 02/01/24 0959      No results found for: SDES, SPECREQUEST, CULT, REPTSTATUS  Procedures:  Mennie LAMY, MD [facility] 01/19/2024, 9:53 AM

## 2024-01-19 NOTE — Plan of Care (Signed)

## 2024-01-19 NOTE — Progress Notes (Addendum)
 PHARMACY - ANTICOAGULATION CONSULT NOTE  Pharmacy Consult for warfarin Indication: On-X mechanical AVR  Allergies  Allergen Reactions   Amoxicillin Hives    Patient Measurements: Height: 5' 11 (180.3 cm) Weight: 66.3 kg (146 lb 2.6 oz) IBW/kg (Calculated) : 75.3 HEPARIN  DW (KG): 71  Vital Signs: Temp: 97.3 F (36.3 C) (08/10 0428) BP: 111/68 (08/10 0428) Pulse Rate: 57 (08/10 0428)  Labs: Recent Labs    01/17/24 0225 01/18/24 0224 01/19/24 0542  LABPROT 24.3* 16.1* 14.6  INR 2.1* 1.2 1.1  CREATININE 0.68 0.64 0.69    Estimated Creatinine Clearance: 123.2 mL/min (by C-G formula based on SCr of 0.69 mg/dL).   Medical History: Past Medical History:  Diagnosis Date   Asthma    Headache    migraines   Heart murmur    Hypokalemia 06/08/2020   Leukocytosis 01/23/2022   Mild aortic stenosis    Pancreatitis    Pneumonia    as a child x several   Substance abuse (HCC)     Medications:  Scheduled:   aspirin  EC  81 mg Oral Daily   dicyclomine   20 mg Oral TID AC & HS   feeding supplement (PROSource TF20)  60 mL Per Tube Daily   free water   50 mL Per Tube Q2H   lipase/protease/amylase  72,000 Units Oral TID AC   loratadine   10 mg Oral Daily   metoprolol  succinate  12.5 mg Oral QHS   rifaximin   550 mg Oral TID   sodium chloride  flush  3 mL Intravenous Q12H   warfarin  10 mg Oral ONCE-1600   Warfarin - Pharmacist Dosing Inpatient   Does not apply q1600    Assessment: 33 yo M presenting with pancreatitis. PTA regimen is 6mg  Monday and Thursday and 4.5 mg all other days (TWD: 34.5mg ). INR continues to drop despite dose increase and patient's INR today is subtherapeutic at 1.1. Of note, the team started rifaximin  8/9 which may decrease INR. Totally weekly dose inpatient has been 27mg , which is less than his home dose. Will increase dose today to approximate PTA TWD given subtherapeutic INR and lower TWD inpatient. Will bridge with therapeutic enoxaparin  given  consecutive subtherapeutic INRs until INR 1.5. Will continue to monitor.   Goal of Therapy:  INR 1.5-2 Monitor platelets by anticoagulation protocol: Yes   Plan:  Warfarin 6mg  PO x1 at 1600 Enoxaparin  60mg  subcutaneously q12h until INR 1.5 Monitor INR daily Monitor for drug interactions  Elma Fail, PharmD PGY1 Clinical Pharmacist [facility]  01/19/2024 7:21 AM

## 2024-01-20 ENCOUNTER — Encounter (HOSPITAL_COMMUNITY): Payer: Self-pay | Admitting: Internal Medicine

## 2024-01-20 DIAGNOSIS — K861 Other chronic pancreatitis: Secondary | ICD-10-CM | POA: Diagnosis not present

## 2024-01-20 DIAGNOSIS — K8681 Exocrine pancreatic insufficiency: Secondary | ICD-10-CM | POA: Diagnosis not present

## 2024-01-20 LAB — COMPREHENSIVE METABOLIC PANEL WITH GFR
ALT: 19 U/L (ref 0–44)
AST: 19 U/L (ref 15–41)
Albumin: 3.6 g/dL (ref 3.5–5.0)
Alkaline Phosphatase: 69 U/L (ref 38–126)
Anion gap: 8 (ref 5–15)
BUN: 18 mg/dL (ref 6–20)
CO2: 30 mmol/L (ref 22–32)
Calcium: 9.4 mg/dL (ref 8.9–10.3)
Chloride: 101 mmol/L (ref 98–111)
Creatinine, Ser: 0.71 mg/dL (ref 0.61–1.24)
GFR, Estimated: >60 mL/min (ref >60)
Glucose, Bld: 120 mg/dL — ABNORMAL HIGH (ref 70–99)
Potassium: 4.3 mmol/L (ref 3.5–5.1)
Sodium: 139 mmol/L (ref 135–145)
Total Bilirubin: 0.3 mg/dL (ref 0.0–1.2)
Total Protein: 7.1 g/dL (ref 6.5–8.1)

## 2024-01-20 LAB — GLUCOSE, CAPILLARY
Glucose-Capillary: 109 mg/dL — ABNORMAL HIGH (ref 70–99)
Glucose-Capillary: 116 mg/dL — ABNORMAL HIGH (ref 70–99)
Glucose-Capillary: 94 mg/dL (ref 70–99)

## 2024-01-20 LAB — PROTIME-INR
INR: 1.2 (ref 0.8–1.2)
Prothrombin Time: 15.6 s — ABNORMAL HIGH (ref 11.4–15.2)

## 2024-01-20 MED ORDER — WARFARIN SODIUM 7.5 MG PO TABS
7.5000 mg | ORAL_TABLET | Freq: Once | ORAL | Status: AC
  Administered 2024-01-20 (×2): 7.5 mg via ORAL
  Filled 2024-01-20: qty 1

## 2024-01-20 NOTE — Plan of Care (Signed)
  Problem: Education: Goal: Knowledge of General Education information will improve Description: Including pain rating scale, medication(s)/side effects and non-pharmacologic comfort measures Outcome: Progressing   Problem: Health Behavior/Discharge Planning: Goal: Ability to manage health-related needs will improve Outcome: Progressing   Problem: Clinical Measurements: Goal: Will remain free from infection Outcome: Progressing Goal: Cardiovascular complication will be avoided Outcome: Progressing

## 2024-01-20 NOTE — Progress Notes (Signed)
 PHARMACY - ANTICOAGULATION CONSULT NOTE  Pharmacy Consult for warfarin Indication: On-X mechanical AVR  Allergies  Allergen Reactions   Amoxicillin Hives    Patient Measurements: Height: 5' 11 (180.3 cm) Weight: 66.3 kg (146 lb 2.6 oz) IBW/kg (Calculated) : 75.3 HEPARIN  DW (KG): 71  Vital Signs: Temp: 98.5 F (36.9 C) (08/11 0418) Temp Source: Oral (08/10 2045) BP: 108/65 (08/11 0418) Pulse Rate: 61 (08/11 0418)  Labs: Recent Labs    01/18/24 0224 01/19/24 0542 01/20/24 0443  LABPROT 16.1* 14.6 15.6*  INR 1.2 1.1 1.2  CREATININE 0.64 0.69 0.71    Estimated Creatinine Clearance: 123.2 mL/min (by C-G formula based on SCr of 0.71 mg/dL).   Medical History: Past Medical History:  Diagnosis Date   Asthma    Headache    migraines   Heart murmur    Hypokalemia 06/08/2020   Leukocytosis 01/23/2022   Mild aortic stenosis    Pancreatitis    Pneumonia    as a child x several   Substance abuse (HCC)     Medications:  Scheduled:   aspirin  EC  81 mg Oral Daily   dicyclomine   20 mg Oral TID AC & HS   enoxaparin  (LOVENOX ) injection  60 mg Subcutaneous Q12H   feeding supplement (PROSource TF20)  60 mL Per Tube Daily   free water   50 mL Per Tube Q2H   lipase/protease/amylase  72,000 Units Oral TID AC   loratadine   10 mg Oral Daily   metoprolol  succinate  12.5 mg Oral QHS   rifaximin   550 mg Oral TID   sodium chloride  flush  3 mL Intravenous Q12H   Warfarin - Pharmacist Dosing Inpatient   Does not apply q1600    Assessment: 33 yo M presenting with pancreatitis. PTA regimen is 6mg  Monday and Thursday and 4.5 mg all other days (TWD: 34.5mg ).   8/11 AM: INR continues to be low despite dose increase and patient's INR today is subtherapeutic at 1.2. Of note, the team started rifaximin  8/9 which may decrease INR. Will continue to bridge with therapeutic enoxaparin  given consecutive subtherapeutic INRs until INR 1.5. Will continue to monitor.   Goal of Therapy:  INR  1.5-2 Monitor platelets by anticoagulation protocol: Yes   Plan:  Warfarin 7.5 mg PO x1 at 1600 Enoxaparin  60mg  subcutaneously q12h until INR 1.5 Monitor INR daily Monitor for drug interactions  Donny Alert, PharmD, [facility] Clinical Pharmacist Please see AMION for all Pharmacists' Contact Phone Numbers 01/20/2024, 7:27 AM

## 2024-01-20 NOTE — Progress Notes (Addendum)
 Patient ID: [name] [name], male   DOB: 1990/09/24, 33 y.o.   MRN: [id number]    Progress Note   Subjective   Day # 9 CC; acute on chronic calcific pancreatitis  Postpyloric feeds  Labs today-pro time 15.6/INR 1.2 CMET within normal limits/glucose 120  Patient appears very comfortable, says he has not required any pain medication in over 24 hours, tolerating soft diet without difficulty, breakfast tray empty.   Objective   Vital signs in last 24 hours: Temp:  [97.5 F (36.4 C)-98.7 F (37.1 C)] 98.6 F (37 C) (08/11 0813) Pulse Rate:  [61-74] 71 (08/11 0813) Resp:  [16-18] 16 (08/11 0418) BP: (103-119)/(65-79) 118/78 (08/11 0813) SpO2:  [98 %-100 %] 100 % (08/11 0813) Last BM Date : 01/16/24 General:    Young white male in NAD Heart:  Regular rate and rhythm; no murmurs Lungs: Respirations even and unlabored, lungs CTA bilaterally Abdomen:  Soft, minimally tender hypogastrium, no guarding or rebound normal bowel sounds. Extremities:  Without edema. Neurologic:  Alert and oriented,  grossly normal neurologically. Psych:  Cooperative. Normal mood and affect.  Intake/Output from previous day: 08/10 0701 - 08/11 0700 In: 855 [P.O.:355; NG/GT:500] Out: -  Intake/Output this shift: Total I/O In: 50 [NG/GT:50] Out: -   Lab Results: No results for input(s): WBC, HGB, HCT, PLT in the last 72 hours. 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  Multiple prior hospitalizations for pancreatitis. Probable component of outlet obstruction this admission resolving Poor p.o. intake earlier this admission requiring core track placement and postpyloric feedings initiated 01/15/2024 Diet has been able to be advanced in the interim and he is now tolerating a soft diet without difficulty and able to eat an entire tray.   #2 CFTR gene mutation present #3 status post aortic valve replacement/mechanical-on chronic Coumadin  #4 hypertension #5 outpatient SIBO test positive, completing a course of rifaximin   Plan; continue soft diet Will discontinue postpyloric feedings today and leave cor track in place, if he tolerates soft diet for another 24 hours without any issues then can discontinue the cortrack I think he will be able to be discharged home in the next 24 to 48 hours He is scheduled for outpatient pancreatic ERCP and pancreatic duct stent placement with Dr. Wilhelmenia /29/2025.    Principal Problem:   Chronic recurrent pancreatitis (HCC) Active Problems:   S/P AVR (aortic valve replacement) and aortoplasty   Subtherapeutic international normalized ratio (INR)   Essential hypertension   Pseudocyst of pancreas   On pre-exposure prophylaxis for HIV   History of asthma   History of alcohol abuse   Moderate malnutrition (HCC)   Protein-calorie malnutrition, severe  Small intestinal bacterial overgrowth (SIBO)     LOS: 9 days   Amy EsterwoodPA-C  01/20/2024, 8:31 AM    Attending physician's note   I have taken history, reviewed the chart and examined the patient. I performed a substantive portion of this encounter, including complete performance of at least one of the key components, in conjunction with the APP. I agree with the Advanced Practitioner's note, impression and recommendations.   Got signout from  Dr. Albertus.  Chronic calcific pancreatitis likely d/t ETOH with PD dil/stones. +CTFR mutation of unknown significance. S/S gastric outlet obstruction appears to have resolved.  Tolerating PO Mechanical AVR on chronic Coumadin . Outpt +SIBO on rifaximin   Plan - Clamp cortrak (done). If no N/V, then D/C cortrak in AM - Outpatient ERCP scheduled with Dr. Wilhelmenia 9/29 - If better, OK to D/C tomorrow.  Resume all outpatient meds. - Strict continued  ETOH abstinence. - Rifaximin  550tidx 14 days - D/W pt in detail.    Anselm Bring, MD [facility] [phone]

## 2024-01-20 NOTE — Progress Notes (Signed)
 PROGRESS NOTE [name] [name]  FMW:[id number] DOB: [birth year] DOA: 01/11/2024 PCP: Lucius Krabbe, NP   Brief Narrative/Hospital Course: 33 y.o. male with medical history significant of hypertension, bicuspid aortic valve with severe aortic insufficiency and moderate aortic stenosis s/p mechanical AV replacement on 09/27/2021 on chronic anticoagulation, recurrent pancreatitis, asthma, GERD, and prior history of alcohol abuse presents with abdominal pain found to have AKI pancreatitis with pseudocyst, GI was consulted and admitted for conservative management. Patient had multiple hospitalization for pancreatitis. Patient with decreased tolerance to diet and w/ severe malnutrition> cortrak placed post pyloric on 01/15/24, diet orally was slowly advanced Overall patient is tolerating soft diet and tolerating tube feed  Subjective: Seen examined Overnight afebrile BP stable. Labs remains stable INR subtherapeutic slightly trending up Reports she has been tolerating diet well, had a bowel movement, no nausea vomiting  Assessment and plan:  Acute on chronic pancreatitis-mutation in his CFTR gene Abdominal pain Previous alcohol use currently in remission Pseudocyst: did not progress well, and with Severe malnutrition GI advised tube feeding> s/p cortrak and on post pyloric feeding on 01/15/24 Diet is advanced on soft diet, overall tolerating p.o. and tube feeding If he is able to meet his calorie requirement plan is to remove NG tube soon Appreciate GI input on board continue with prn antiemetics, Bentyl . Maalox has mutation on CFTR gene and now being referred to medical genetics outpatient by GI-he is set up for ERCP on 03/09/24 as OP at [facility].  SIBO: Patient had positive breath test as per GI he is on rifaximin  3 times daily continue to complete course x 14 days  Bicuspid AV/Severe AI/mod AS s/p Mechanical AVR/aortoplasty 09/2021 with goal INR 1.5-2 on Coumadin  /Aspirin   indefinitely: INR remains subtherapeutic INR today discussed with cardiologist -continue Lovenox  bridge while INR subtherapeutic pharmacy dosing both  Reports she will not be able to go home with Lovenox  injection, he does not get INR follow-up until few weeks Recent Labs  Lab 01/16/24 0241 01/17/24 0225 01/18/24 0224 01/19/24 0542 01/20/24 0443  INR 2.7* 2.1* 1.2 1.1 1.2     Essential hypertension Well-controlled on metoprolol    Asthma Not wheezing. Cont  prn nebs   On preexposure prophylaxis for HIV PTA on Truvada prophylaxis but stopped about a week and a half PTA due to his symptoms. Last HIV test nonreactive 04/03/2023. Resume on discharge   History of alcohol abuse now in remission x 10 months: Encouraged cessation.   Normocytic anemia from chronic disease: Slightly low.  Stable.  Monitor   Severe malnutrition : Postpyloric core Trak tube feeding started -augment diet as below Nutrition Problem: Severe Malnutrition Etiology: vomiting, chronic illness, altered GI function Signs/Symptoms: moderate fat depletion, severe muscle depletion Interventions: Refer to RD note for recommendations   Mobility: Ambulatory at baseline  DVT prophylaxis: Coumadin  Code Status:   Code Status: Full Code Family Communication: plan of care discussed with patient at bedside. Patient status is: Remains hospitalized because of severity of illness Level of care: Telemetry Medical   Dispo: The patient is from: HOME            Anticipated disposition: TBD-pending GI clearance Objective: Vitals last 24 hrs: Vitals:   01/19/24 1657 01/19/24 2045 01/20/24 0053 01/20/24 0418  BP: 119/78 103/67 115/74 108/65  Pulse: 64 74 65 61  Resp: 17 17 18 16   Temp: (!) 97.5 F (36.4 C) 98.7 F (37.1 C) 98.5 F (36.9 C) 98.5 F (36.9 C)  TempSrc: Oral Oral  SpO2: 100% 98% 98% 99%  Weight:      Height:        Physical Examination: General exam: Aaox3, thin frail, pleasant  HEENT:Oral mucosa  moist, Ear/Nose WNL grossly Respiratory system: No tachypnea  Cardiovascular system: S1 & S2 +, No JVD. Gastrointestinal system: Abdomen soft NT ND, BS+ Nervous System: Alert, awake, moving all extremities,and following commands. Extremities: LE edema neg,distal peripheral pulses palpable and warm.  Skin: No rashes,no icterus. MSK: Normal muscle bulk,tone, power   Medications reviewed:  Scheduled Meds:  aspirin  EC  81 mg Oral Daily   dicyclomine   20 mg Oral TID AC & HS   enoxaparin  (LOVENOX ) injection  60 mg Subcutaneous Q12H   feeding supplement (PROSource TF20)  60 mL Per Tube Daily   free water   50 mL Per Tube Q2H   lipase/protease/amylase  72,000 Units Oral TID AC   loratadine   10 mg Oral Daily   metoprolol  succinate  12.5 mg Oral QHS   rifaximin   550 mg Oral TID   sodium chloride  flush  3 mL Intravenous Q12H   warfarin  7.5 mg Oral ONCE-1600   Warfarin - Pharmacist Dosing Inpatient   Does not apply q1600   Continuous Infusions:  feeding supplement (OSMOLITE 1.5 CAL) 1,000 mL (01/19/24 0537)   Diet: Diet Order             DIET SOFT Room service appropriate? Yes; Fluid consistency: Thin  Diet effective now                    Data Reviewed: I have personally reviewed following labs and imaging studies ( see epic result tab) CBC: Recent Labs  Lab 01/14/24 0426 01/16/24 0241  WBC 9.2 6.5  HGB 11.0* 10.5*  HCT 33.7* 32.0*  MCV 89.6 89.1  PLT 236 288   CMP: Recent Labs  Lab 01/15/24 1631 01/16/24 0241 01/17/24 0225 01/18/24 0224 01/19/24 0542 01/20/24 0443  NA  --  139 142 137 140 139  K  --  4.0 3.9 4.5 4.6 4.3  CL  --  102 104 105 102 101  CO2  --  28 28 28 28 30   GLUCOSE  --  108* 106* 110* 103* 120*  BUN  --  9 9 9 15 18   CREATININE  --  0.72 0.68 0.64 0.69 0.71  CALCIUM   --  9.0 8.9 8.9 9.7 9.4  MG 1.8 1.7 1.8 2.1 2.1  --   PHOS 3.9 4.7* 3.8 4.3 4.7*  --    GFR: Estimated Creatinine Clearance: 123.2 mL/min (by C-G formula based on SCr of 0.71  mg/dL). Recent Labs  Lab 01/16/24 0241 01/17/24 0225 01/18/24 0224 01/19/24 0542 01/20/24 0443  AST 19 16 16 18 19   ALT 18 18 17 19 19   ALKPHOS 57 62 68 87 69  BILITOT 0.2 0.4 <0.2 0.4 0.3  PROT 6.5 6.4* 6.5 7.8 7.1  ALBUMIN  3.2* 3.2* 3.3* 4.2 3.6    No results for input(s): LIPASE, AMYLASE in the last 168 hours.  No results for input(s): AMMONIA in the last 168 hours. Coagulation Profile:  Recent Labs  Lab 01/16/24 0241 01/17/24 0225 01/18/24 0224 01/19/24 0542 01/20/24 0443  INR 2.7* 2.1* 1.2 1.1 1.2   Unresulted Labs (From admission, onward)     Start     Ordered   01/12/24 0500  Protime-INR  Daily,   R      01/11/24 9072  Antimicrobials/Microbiology: Anti-infectives (From admission, onward)    Start     Dose/Rate Route Frequency Ordered Stop   01/18/24 1230  rifaximin  (XIFAXAN ) tablet 550 mg        550 mg Oral 3 times daily 01/18/24 1132 02/01/24 0959      No results found for: SDES, SPECREQUEST, CULT, REPTSTATUS  Procedures:  Mennie LAMY, MD [facility] 01/20/2024, 9:18 AM

## 2024-01-21 ENCOUNTER — Inpatient Hospital Stay (HOSPITAL_COMMUNITY)

## 2024-01-21 ENCOUNTER — Telehealth (HOSPITAL_COMMUNITY): Payer: Self-pay | Admitting: Pharmacy Technician

## 2024-01-21 ENCOUNTER — Other Ambulatory Visit (HOSPITAL_COMMUNITY): Payer: Self-pay

## 2024-01-21 DIAGNOSIS — K861 Other chronic pancreatitis: Secondary | ICD-10-CM | POA: Diagnosis not present

## 2024-01-21 DIAGNOSIS — K8681 Exocrine pancreatic insufficiency: Secondary | ICD-10-CM | POA: Diagnosis not present

## 2024-01-21 DIAGNOSIS — R110 Nausea: Secondary | ICD-10-CM

## 2024-01-21 LAB — GLUCOSE, CAPILLARY
Glucose-Capillary: 101 mg/dL — ABNORMAL HIGH (ref 70–99)
Glucose-Capillary: 98 mg/dL (ref 70–99)
Glucose-Capillary: 98 mg/dL (ref 70–99)
Glucose-Capillary: 138 mg/dL — ABNORMAL HIGH (ref 70–99)
Glucose-Capillary: 107 mg/dL — ABNORMAL HIGH (ref 70–99)

## 2024-01-21 LAB — PROTIME-INR
INR: 1.4 — ABNORMAL HIGH (ref 0.8–1.2)
Prothrombin Time: 18.3 s — ABNORMAL HIGH (ref 11.4–15.2)

## 2024-01-21 MED ORDER — WARFARIN SODIUM 7.5 MG PO TABS
7.5000 mg | ORAL_TABLET | Freq: Once | ORAL | Status: AC
  Administered 2024-01-21 (×2): 7.5 mg via ORAL
  Filled 2024-01-21: qty 1

## 2024-01-21 NOTE — Telephone Encounter (Signed)
 Patient Product/process development scientist completed.    The patient is insured through [employer]. Patient has [employer], may use a copay card, and/or apply for patient assistance if available.    Ran test claim for Xifaxan  550 mg and the current 10 day co-pay is $0.00.   This test claim was processed through [facility]- copay amounts may vary at other pharmacies due to pharmacy/plan contracts, or as the patient moves through the different stages of their insurance plan.     Reyes Sharps, CPHT Pharmacy Technician III Certified Patient Advocate [facility] Patient Advocate Team Direct Number: [phone]  Fax: [phone]

## 2024-01-21 NOTE — Progress Notes (Signed)
 PROGRESS NOTE [name] [name]  FMW:[id number] DOB: 01/28/1991 DOA: 01/11/2024 PCP: Lucius Krabbe, NP   Brief Narrative/Hospital Course: 33 y.o. male with medical history significant of hypertension, bicuspid aortic valve with severe aortic insufficiency and moderate aortic stenosis s/p mechanical AV replacement on 09/27/2021 on chronic anticoagulation, recurrent pancreatitis, asthma, GERD, and prior history of alcohol abuse presents with abdominal pain found to have AKI pancreatitis with pseudocyst, GI was consulted and admitted for conservative management. Patient had multiple hospitalization for pancreatitis. Patient with decreased tolerance to diet and w/ severe malnutrition> cortrak placed post pyloric on 01/15/24, diet orally was slowly advanced Overall patient is tolerating soft diet and tolerating tube feed At this time patient remains medically stable  Subjective: Seen examined this morning. He just waking up So far tolerating diet no nausea vomiting Some discomfort with core Trak tube last night  Assessment and plan:  Acute on chronic pancreatitis-mutation in his CFTR gene Abdominal pain Previous alcohol use currently in remission Pseudocyst: did not progress well, and with Severe malnutrition GI advised tube feeding> s/p cortrak and on post pyloric feeding on 01/15/24 Diet advanced on soft diet-overall clinically improved tolerating diet.  Tube feeding held 8/11 If he is able to meet his calorie requirement plan is to remove NG tube and discharge home Appreciate GI input on board continue with prn antiemetics, Bentyl . Maalox has mutation on CFTR gene and now being referred to medical genetics outpatient by GI-he is set up for ERCP and stent placement on 03/09/24 as OP at [facility].  SIBO: Patient had positive breath test as per GI he is on rifaximin  3 times daily continue to complete course x 14 days total  Bicuspid AV/Severe AI/mod AS s/p Mechanical AVR/aortoplasty  09/2021 with goal INR 1.5-2 on Coumadin  /Aspirin  indefinitely: INR is now trending up close to therapeutic he is on Lovenox  bridge.  Patient has difficulty with INR set up/Lovenox  at home> would prefer not to discharge on Lovenox  Recent Labs  Lab 01/17/24 0225 01/18/24 0224 01/19/24 0542 01/20/24 0443 01/21/24 0333  INR 2.1* 1.2 1.1 1.2 1.4*     Essential hypertension Well-controlled on metoprolol    Asthma Not wheezing. Cont  prn nebs   On preexposure prophylaxis for HIV PTA on Truvada prophylaxis but stopped about a week and a half PTA due to his symptoms. Last HIV test nonreactive 04/03/2023. Resume on discharge   History of alcohol abuse now in remission x 10 months: Encouraged cessation.   Normocytic anemia from chronic disease: Slightly low.  Stable.  Monitor   Severe malnutrition : Diet augmented Nutrition Problem: Severe Malnutrition Etiology: vomiting, chronic illness, altered GI function Signs/Symptoms: moderate fat depletion, severe muscle depletion Interventions: Refer to RD note for recommendations   Mobility: Ambulatory at baseline  DVT prophylaxis: Coumadin  Code Status:   Code Status: Full Code Family Communication: plan of care discussed with patient at bedside. Patient status is: Remains hospitalized because of severity of illness Level of care: Telemetry Medical   Dispo: The patient is from: HOME            Anticipated disposition: Home tomorrow   Objective: Vitals last 24 hrs: Vitals:   01/21/24 0400 01/21/24 0500 01/21/24 0750 01/21/24 0815  BP: 118/79  115/75   Pulse: 66  65   Resp:   16   Temp: 98.1 F (36.7 C)  97.8 F (36.6 C)   TempSrc: Oral  Oral   SpO2: 100%  100%   Weight:  64.5 kg  66 kg  Height:        Physical Examination: General exam: Aaox3, pleasant  HEENT:Oral mucosa moist, Ear/Nose WNL grossly Respiratory system: No tachypnea  Cardiovascular system: S1 & S2 +, No JVD. Gastrointestinal system: Abdomen soft NT ND,  BS+ Nervous System: Alert, awake, moving all extremities,and following commands. Extremities: LE edema neg,distal peripheral pulses palpable and warm.  Skin: No rashes,no icterus. MSK: Normal muscle bulk,tone, power   Medications reviewed:  Scheduled Meds:  aspirin  EC  81 mg Oral Daily   dicyclomine   20 mg Oral TID AC & HS   enoxaparin  (LOVENOX ) injection  60 mg Subcutaneous Q12H   feeding supplement (PROSource TF20)  60 mL Per Tube Daily   free water   50 mL Per Tube Q2H   lipase/protease/amylase  72,000 Units Oral TID AC   loratadine   10 mg Oral Daily   metoprolol  succinate  12.5 mg Oral QHS   rifaximin   550 mg Oral TID   sodium chloride  flush  3 mL Intravenous Q12H   warfarin  7.5 mg Oral ONCE-1600   Warfarin - Pharmacist Dosing Inpatient   Does not apply q1600   Continuous Infusions:  feeding supplement (OSMOLITE 1.5 CAL) 1,000 mL (01/19/24 0537)   Diet: Diet Order             DIET SOFT Room service appropriate? Yes; Fluid consistency: Thin  Diet effective now                    Data Reviewed: I have personally reviewed following labs and imaging studies ( see epic result tab) CBC: Recent Labs  Lab 01/16/24 0241  WBC 6.5  HGB 10.5*  HCT 32.0*  MCV 89.1  PLT 288   CMP: Recent Labs  Lab 01/15/24 1631 01/16/24 0241 01/17/24 0225 01/18/24 0224 01/19/24 0542 01/20/24 0443  NA  --  139 142 137 140 139  K  --  4.0 3.9 4.5 4.6 4.3  CL  --  102 104 105 102 101  CO2  --  28 28 28 28 30   GLUCOSE  --  108* 106* 110* 103* 120*  BUN  --  9 9 9 15 18   CREATININE  --  0.72 0.68 0.64 0.69 0.71  CALCIUM   --  9.0 8.9 8.9 9.7 9.4  MG 1.8 1.7 1.8 2.1 2.1  --   PHOS 3.9 4.7* 3.8 4.3 4.7*  --    GFR: Estimated Creatinine Clearance: 122.6 mL/min (by C-G formula based on SCr of 0.71 mg/dL). Recent Labs  Lab 01/16/24 0241 01/17/24 0225 01/18/24 0224 01/19/24 0542 01/20/24 0443  AST 19 16 16 18 19   ALT 18 18 17 19 19   ALKPHOS 57 62 68 87 69  BILITOT 0.2 0.4 <0.2  0.4 0.3  PROT 6.5 6.4* 6.5 7.8 7.1  ALBUMIN  3.2* 3.2* 3.3* 4.2 3.6    No results for input(s): LIPASE, AMYLASE in the last 168 hours.  No results for input(s): AMMONIA in the last 168 hours. Coagulation Profile:  Recent Labs  Lab 01/17/24 0225 01/18/24 0224 01/19/24 0542 01/20/24 0443 01/21/24 0333  INR 2.1* 1.2 1.1 1.2 1.4*   Unresulted Labs (From admission, onward)     Start     Ordered   01/21/24 1600  Protime-INR  Once-Timed,   TIMED        01/21/24 0736   01/21/24 1027  CBC with Differential/Platelet  Once,   R        01/21/24 1026   01/12/24 0500  Protime-INR  Daily,   R      01/11/24 9072           Antimicrobials/Microbiology: Anti-infectives (From admission, onward)    Start     Dose/Rate Route Frequency Ordered Stop   01/18/24 1230  rifaximin  (XIFAXAN ) tablet 550 mg        550 mg Oral 3 times daily 01/18/24 1132 02/01/24 0959      No results found for: SDES, SPECREQUEST, CULT, REPTSTATUS  Procedures:  Mennie LAMY, MD [facility] 01/21/2024, 11:02 AM

## 2024-01-21 NOTE — Plan of Care (Signed)

## 2024-01-21 NOTE — Progress Notes (Signed)
 Dr. Charlanne requested order for pt to be NPO due to fluid in pt abdomen, until pt can be seen tomorrow.

## 2024-01-21 NOTE — Progress Notes (Addendum)
 Patient ID: [name] [name], male   DOB: 05/17/1991, 33 y.o.   MRN: [id number]    Progress Note   Subjective   Day # 10 CC; acute on chronic calcific pancreatitis  Chronic Coumadin   Tube feedings discontinued yesterday  Labs pro time 18.3/INR 1.4  Says he woke up nauseated this morning, no vomiting, no abdominal pain, tried but unable to eat any significant amount of breakfast.  He says he almost passed out from a Lovenox  injection as well-he  thinks from the pain.   Objective   Vital signs in last 24 hours: Temp:  [97.5 F (36.4 C)-98.7 F (37.1 C)] 97.8 F (36.6 C) (08/12 0750) Pulse Rate:  [63-74] 65 (08/12 0750) Resp:  [16-18] 16 (08/12 0750) BP: (98-124)/(63-79) 115/75 (08/12 0750) SpO2:  [99 %-100 %] 100 % (08/12 0750) Weight:  [64.5 kg-66 kg] 66 kg (08/12 0815) Last BM Date : 01/19/24 General:   [name] in NAD Heart:  Regular rate and rhythm; no murmurs Lungs: Respirations even and unlabored, lungs CTA bilaterally Abdomen:  Soft, basically nontender, nondistended.  No guarding, normal bowel sounds. Extremities:  Without edema. Neurologic:  Alert and oriented,  grossly normal neurologically. Psych:  Cooperative. Normal mood and affect.  Intake/Output from previous day: 08/11 0701 - 08/12 0700 In: 560 [NG/GT:560] Out: -  Intake/Output this shift: No intake/output data recorded.  Lab Results: No results for input(s): WBC, HGB, HCT, PLT in the last 72 hours. 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  Prior admits for pancreatitis. There was concern for opponent of outlet obstruction earlier this admission have been resolving and he was able to eat solid food consistently without any difficulty over the weekend.  Core track had been placed earlier due to poor oral intake, tube feeds stopped yesterday but tube left in place.  Today patient is complaining of nausea without vomiting and really did not eat much for breakfast.  No change in pain which has been minimal  #2 CF TR gene mutation present #3.  Status post aortic valve replacement mechanical on chronic Coumadin  INR subtherapeutic #4 hypertension #5.  Outpatient SIBO test positive completing a course of Xifaxan  here  Plan; observe today, Zofran  as needed He would like the core track removed,-will see how he does over the next couple of meals and if able to eat then we will remove core track If nausea persists or he has any vomiting, inability to take p.o.'s for the rest of the day then will need repeat CT imaging Add CBC today      Principal Problem:   Chronic recurrent pancreatitis (HCC) Active Problems:   S/P AVR (aortic valve replacement) and aortoplasty   Subtherapeutic international normalized ratio (INR)   Essential hypertension  Pseudocyst of pancreas   On pre-exposure prophylaxis for HIV   History of asthma   History of alcohol abuse   Moderate malnutrition (HCC)   Protein-calorie malnutrition, severe   Small intestinal bacterial overgrowth (SIBO)     LOS: 10 days   Amy EsterwoodPA-C  01/21/2024, 9:18 AM    Attending physician's note   I have taken history, reviewed the chart and examined the patient. I performed a substantive portion of this encounter, including complete performance of at least one of the key components, in conjunction with the APP.  I agree with the Advanced Practitioner's note, impression and recommendations.   Nauseated this morning.  No abdominal pain.  Overall does feel better  Plan: - Keep cortrak tube in for now - Check CBC, CMP, lipase in AM - X ray KUB 2 V today. - If not better by AM, CT AP - Continue supportive treatment.   Anselm Bring, MD [facility] [phone]

## 2024-01-21 NOTE — Progress Notes (Signed)
 PHARMACY - ANTICOAGULATION CONSULT NOTE  Pharmacy Consult for warfarin Indication: On-X mechanical AVR  Allergies  Allergen Reactions   Amoxicillin Hives    Patient Measurements: Height: 5' 11 (180.3 cm) Weight: 66 kg (145 lb 9.6 oz) IBW/kg (Calculated) : 75.3 HEPARIN  DW (KG): 71  Vital Signs: Temp: 97.8 F (36.6 C) (08/12 0750) Temp Source: Oral (08/12 0750) BP: 115/75 (08/12 0750) Pulse Rate: 65 (08/12 0750)  Labs: Recent Labs    01/19/24 0542 01/20/24 0443 01/21/24 0333  LABPROT 14.6 15.6* 18.3*  INR 1.1 1.2 1.4*  CREATININE 0.69 0.71  --     Estimated Creatinine Clearance: 122.6 mL/min (by C-G formula based on SCr of 0.71 mg/dL).   Medical History: Past Medical History:  Diagnosis Date   Asthma    Headache    migraines   Heart murmur    Hypokalemia 06/08/2020   Leukocytosis 01/23/2022   Mild aortic stenosis    Pancreatitis    Pneumonia    as a child x several   Substance abuse (HCC)     Medications:  Scheduled:   aspirin  EC  81 mg Oral Daily   dicyclomine   20 mg Oral TID AC & HS   enoxaparin  (LOVENOX ) injection  60 mg Subcutaneous Q12H   feeding supplement (PROSource TF20)  60 mL Per Tube Daily   free water   50 mL Per Tube Q2H   lipase/protease/amylase  72,000 Units Oral TID AC   loratadine   10 mg Oral Daily   metoprolol  succinate  12.5 mg Oral QHS   rifaximin   550 mg Oral TID   sodium chloride  flush  3 mL Intravenous Q12H   Warfarin - Pharmacist Dosing Inpatient   Does not apply q1600    Assessment: 33 yo M presenting with pancreatitis. PTA regimen is 6mg  Monday and Thursday and 4.5 mg all other days (TWD: 34.5mg ).  INR is subtherpeutic today but trending up after increased warfarin doses.  Of note, the team started rifaximin  8/9 which may decrease INR. Will continue to bridge with therapeutic enoxaparin  given consecutive subtherapeutic INRs until INR 1.5. Will continue to monitor.   Goal of Therapy:  INR 1.5-2 Monitor platelets by  anticoagulation protocol: Yes   Plan:  Warfarin 7.5 mg PO x1 at 1600 Enoxaparin  60mg  subcutaneously q12h until INR 1.5 Monitor INR daily Monitor for drug interactions   [employer], Pharm.D., BCPS Clinical Pharmacist Clinical phone for 01/21/2024 from 7:30-3:00 is [phone].  **Pharmacist phone directory can be found on [email or web address] listed under [facility].  01/21/2024 9:46 AM

## 2024-01-21 NOTE — Progress Notes (Signed)
 Nutrition Follow-up  DOCUMENTATION CODES:   Severe malnutrition in context of chronic illness  INTERVENTION:  -Continue soft diet as tolerated -EN on hold to assess PO intake tolerance, so far tolerating -Add ProSource Plus BID to promote adequate protein with low fat content -Discussed goal of increased kcal/pro intake to prevent further weight loss, fuel body  NUTRITION DIAGNOSIS:   Severe Malnutrition related to vomiting, chronic illness, altered GI function as evidenced by moderate fat depletion, severe muscle depletion.  Ongoing  GOAL:   Patient will meet greater than or equal to 90% of their needs  Progressing  MONITOR:   PO intake, Weight trends, TF tolerance, Supplement acceptance, Diet advancement, Labs, Skin  REASON FOR ASSESSMENT:   New TF, Diagnosis  ASSESSMENT:   Hx hypertension, bicuspid aortic valve with severe aortic insufficiency and moderate aortic stenosis s/p mechanical aortic valve replacement in arthroplasty on 09/27/2021 on chronic anticoagulation, recurrent pancreatitis, asthma, GERD, and prior history of alcohol abuse presents with abdominal pain.  Spoke to pt at bedside, father visiting from [place] in room today. Pt diet upgraded to soft, so far tolerating PO intake. MD held EN today to assess tolerance of PO intake. No noted or stated n/v/c/d or chewing/swallowing difficulties. Last BM 8/10. Long discussion on nutrition goals; importance of adequate kcal/pro intake, preventing further weight loss. Pt does state that he does restrict certain foods at baseline so he doesn't have to take pancreatic enzymes to eat them. Discussed balance of limiting fat intake and need for increased kcal intake, taking pancreatic enzymes as necessary. Discussed typical nutrition recommendations for SIBO, including low FODMAP elimination diet. Discussed focusing on high kcal/pro intake at this point without additional restrictions required by low FODMAP diet, even though  temporary. Handouts for pancreatitis nutrition and SIBO recommendations given for reference. Pt/father deny additional questions/concerns at this time, will continue to monitor, RDN available prn.   Labs BG 94-120 Phos 4.7 Lipase 338 H/H 10.5/32.0  Medications  aspirin  EC  81 mg Oral Daily   dicyclomine   20 mg Oral TID AC & HS   enoxaparin  (LOVENOX ) injection  60 mg Subcutaneous Q12H   feeding supplement (PROSource TF20)  60 mL Per Tube Daily   free water   50 mL Per Tube Q2H   lipase/protease/amylase  72,000 Units Oral TID AC   loratadine   10 mg Oral Daily   metoprolol  succinate  12.5 mg Oral QHS   rifaximin   550 mg Oral TID   sodium chloride  flush  3 mL Intravenous Q12H   Warfarin - Pharmacist Dosing Inpatient   Does not apply q1600     NUTRITION - FOCUSED PHYSICAL EXAM:  Flowsheet Row Most Recent Value  Orbital Region Moderate depletion  Upper Arm Region Moderate depletion  Thoracic and Lumbar Region Moderate depletion  Buccal Region Moderate depletion  Temple Region Moderate depletion  Clavicle Bone Region Severe depletion  Clavicle and Acromion Bone Region Severe depletion  Scapular Bone Region Moderate depletion  Dorsal Hand Moderate depletion  Patellar Region Moderate depletion  Anterior Thigh Region Moderate depletion  Posterior Calf Region Moderate depletion  Edema (RD Assessment) None  Hair Reviewed  Eyes Reviewed  Mouth Reviewed  Skin Reviewed  Nails Reviewed    Diet Order:   Diet Order             DIET SOFT Room service appropriate? Yes; Fluid consistency: Thin  Diet effective now                 EDUCATION  NEEDS:   Education needs have been addressed  Skin:  Skin Assessment: Reviewed RN Assessment  Last BM:  8/10  Height:   Ht Readings from Last 1 Encounters:  01/11/24 5' 11 (1.803 m)    Weight:   Wt Readings from Last 1 Encounters:  01/21/24 66 kg    BMI:  Body mass index is 20.31 kg/m.  Estimated Nutritional Needs:    Kcal:  2000-2350 kcal  Protein:  105-140 g  Fluid:  >/=2L  Greg Eckrich Daml-Budig, RDN, LDN Registered Dietitian Nutritionist RD Inpatient Contact Info in [place]

## 2024-01-22 ENCOUNTER — Inpatient Hospital Stay (HOSPITAL_COMMUNITY)

## 2024-01-22 DIAGNOSIS — K861 Other chronic pancreatitis: Secondary | ICD-10-CM | POA: Diagnosis not present

## 2024-01-22 DIAGNOSIS — K311 Adult hypertrophic pyloric stenosis: Secondary | ICD-10-CM | POA: Diagnosis not present

## 2024-01-22 DIAGNOSIS — K8681 Exocrine pancreatic insufficiency: Secondary | ICD-10-CM | POA: Diagnosis not present

## 2024-01-22 LAB — CBC WITH DIFFERENTIAL/PLATELET
Abs Immature Granulocytes: 0.02 K/uL (ref 0.00–0.07)
Basophils Absolute: 0 K/uL (ref 0.0–0.1)
Basophils Relative: 0 %
Eosinophils Absolute: 0.6 K/uL — ABNORMAL HIGH (ref 0.0–0.5)
Eosinophils Relative: 8 %
HCT: 34 % — ABNORMAL LOW (ref 39.0–52.0)
Hemoglobin: 11.4 g/dL — ABNORMAL LOW (ref 13.0–17.0)
Immature Granulocytes: 0 %
Lymphocytes Relative: 35 %
Lymphs Abs: 2.5 K/uL (ref 0.7–4.0)
MCH: 29.2 pg (ref 26.0–34.0)
MCHC: 33.5 g/dL (ref 30.0–36.0)
MCV: 87 fL (ref 80.0–100.0)
Monocytes Absolute: 0.8 K/uL (ref 0.1–1.0)
Monocytes Relative: 11 %
Neutro Abs: 3.3 K/uL (ref 1.7–7.7)
Neutrophils Relative %: 46 %
Platelets: 338 K/uL (ref 150–400)
RBC: 3.91 MIL/uL — ABNORMAL LOW (ref 4.22–5.81)
RDW: 13.8 % (ref 11.5–15.5)
WBC: 7.2 K/uL (ref 4.0–10.5)
nRBC: 0 % (ref 0.0–0.2)

## 2024-01-22 LAB — PROTIME-INR
INR: 2 — ABNORMAL HIGH (ref 0.8–1.2)
Prothrombin Time: 23.5 s — ABNORMAL HIGH (ref 11.4–15.2)

## 2024-01-22 LAB — GLUCOSE, CAPILLARY
Glucose-Capillary: 109 mg/dL — ABNORMAL HIGH (ref 70–99)
Glucose-Capillary: 114 mg/dL — ABNORMAL HIGH (ref 70–99)
Glucose-Capillary: 119 mg/dL — ABNORMAL HIGH (ref 70–99)
Glucose-Capillary: 129 mg/dL — ABNORMAL HIGH (ref 70–99)
Glucose-Capillary: 95 mg/dL (ref 70–99)

## 2024-01-22 LAB — COMPREHENSIVE METABOLIC PANEL WITH GFR
ALT: 26 U/L (ref 0–44)
AST: 25 U/L (ref 15–41)
Albumin: 3.6 g/dL (ref 3.5–5.0)
Alkaline Phosphatase: 66 U/L (ref 38–126)
Anion gap: 9 (ref 5–15)
BUN: 16 mg/dL (ref 6–20)
CO2: 28 mmol/L (ref 22–32)
Calcium: 9.5 mg/dL (ref 8.9–10.3)
Chloride: 101 mmol/L (ref 98–111)
Creatinine, Ser: 0.74 mg/dL (ref 0.61–1.24)
GFR, Estimated: >60 mL/min (ref >60)
Glucose, Bld: 102 mg/dL — ABNORMAL HIGH (ref 70–99)
Potassium: 4 mmol/L (ref 3.5–5.1)
Sodium: 138 mmol/L (ref 135–145)
Total Bilirubin: 0.2 mg/dL (ref 0.0–1.2)
Total Protein: 7 g/dL (ref 6.5–8.1)

## 2024-01-22 LAB — LIPASE, BLOOD: Lipase: 155 U/L — ABNORMAL HIGH (ref 11–51)

## 2024-01-22 MED ORDER — PANTOPRAZOLE SODIUM 40 MG IV SOLR
40.0000 mg | Freq: Two times a day (BID) | INTRAVENOUS | Status: DC
  Administered 2024-01-22 – 2024-02-14 (×48): 40 mg via INTRAVENOUS
  Filled 2024-01-22 (×47): qty 10

## 2024-01-22 MED ORDER — PROCHLORPERAZINE EDISYLATE 10 MG/2ML IJ SOLN
5.0000 mg | Freq: Once | INTRAMUSCULAR | Status: AC
  Administered 2024-01-22 (×2): 5 mg via INTRAVENOUS
  Filled 2024-01-22: qty 2

## 2024-01-22 MED ORDER — IOHEXOL 350 MG/ML SOLN
75.0000 mL | Freq: Once | INTRAVENOUS | Status: AC | PRN
  Administered 2024-01-22 (×2): 75 mL via INTRAVENOUS

## 2024-01-22 MED ORDER — WARFARIN SODIUM 2.5 MG PO TABS
2.5000 mg | ORAL_TABLET | Freq: Once | ORAL | Status: AC
  Administered 2024-01-22 (×2): 2.5 mg via ORAL
  Filled 2024-01-22: qty 1

## 2024-01-22 NOTE — Progress Notes (Signed)
 PROGRESS NOTE [name] [name]  FMW:[id number] DOB: [birth year] DOA: 01/11/2024 PCP: Lucius Krabbe, NP   Brief Narrative/Hospital Course: 33 y.o. male with medical history significant of hypertension, bicuspid aortic valve with severe aortic insufficiency and moderate aortic stenosis s/p mechanical AV replacement on 09/27/2021 on chronic anticoagulation, recurrent pancreatitis, asthma, GERD, and prior history of alcohol abuse presents with abdominal pain found to have AKI pancreatitis with pseudocyst, GI was consulted and admitted for conservative management. Patient had multiple hospitalization for pancreatitis. Patient with decreased tolerance to diet and w/ severe malnutrition> cortrak placed post pyloric on 01/15/24, diet orally was slowly advanced  Subjective: Seen examined Feels lke anohter falre up nauseas and abdomen pain Patient had x-ray abdomen yesterday showing distended abdomen with ingested material, placed on clear liquid diet  Assessment and plan  Acute on chronic pancreatitis-mutation in his CFTR gene Abdominal pain Previous alcohol use currently in remission Pseudocyst: did not progress well, and with Severe malnutrition GI advised tube feeding> s/p cortrak and on post pyloric feeding on 01/15/24 Diet advanced on soft diet- but getting nauseas since 8/12  Xray abd as above-concerning opacity having partial gastric outlet obstruction or drain obstruction versus enlarging peripancreatic fluid collections getting CT abdomen per GI. Continue with prn antiemetics, Bentyl . Maalox has mutation on CFTR gene and now being referred to medical genetics outpatient by GI-he is set up for ERCP and stent placement on 03/09/24 as OP at [facility].  SIBO: Patient had positive breath test as per GI he is on rifaximin  3 times daily continue to complete course x 14 days total  Bicuspid AV/Severe AI/mod AS s/p Mechanical AVR/aortoplasty 09/2021 with goal INR 1.5-2 on Coumadin  /Aspirin   indefinitely: INR is now therapeutic will discontinue Lovenox  bridge  Recent Labs  Lab 01/18/24 0224 01/19/24 0542 01/20/24 0443 01/21/24 0333 01/22/24 0222  INR 1.2 1.1 1.2 1.4* 2.0*     Essential hypertension Well-controlled on metoprolol    Asthma Not wheezing. Cont  prn nebs   On preexposure prophylaxis for HIV PTA on Truvada prophylaxis but stopped about a week and a half PTA due to his symptoms. Last HIV test nonreactive 04/03/2023. Resume on discharge   History of alcohol abuse now in remission x 10 months: Encouraged cessation.   Normocytic anemia from chronic disease: Slightly low.  Stable.  Monitor   Severe malnutrition : Diet augmented Nutrition Problem: Severe Malnutrition Etiology: vomiting, chronic illness, altered GI function Signs/Symptoms: moderate fat depletion, severe muscle depletion Interventions: Refer to RD note for recommendations   Mobility: Ambulatory at baseline  DVT prophylaxis: Coumadin  Code Status:   Code Status: Full Code Family Communication: plan of care discussed with patient at bedside. Patient status is: Remains hospitalized because of severity of illness Level of care: Telemetry Medical   Dispo: The patient is from: HOME            Anticipated disposition: Home once tolerating diet and okay GI  Objective: Vitals last 24 hrs: Vitals:   01/21/24 2013 01/22/24 0043 01/22/24 0409 01/22/24 0755  BP: (!) 117/96 113/74 (!) 141/87 123/82  Pulse: 71 (!) 59 66 74  Resp:  18 18 17   Temp: 97.9 F (36.6 C) 97.9 F (36.6 C) 97.9 F (36.6 C) 98 F (36.7 C)  TempSrc: Oral     SpO2: 100% 98% 99% 97%  Weight:      Height:        Physical Examination: General exam: Aaox3 NGT+ HEENT:Oral mucosa moist, Ear/Nose WNL grossly Respiratory system: No tachypnea  Cardiovascular system: S1 & S2 +, No JVD. Gastrointestinal system: Abdomen soft  tender,BS+ Nervous System: Alert, awake, moving all extremities,and following  commands. Extremities: LE edema neg,distal peripheral pulses palpable and warm.  Skin: No rashes,no icterus. MSK: Normal muscle bulk,tone, power   Medications reviewed:  Scheduled Meds:  aspirin  EC  81 mg Oral Daily   dicyclomine   20 mg Oral TID AC & HS   feeding supplement (PROSource TF20)  60 mL Per Tube Daily   free water   50 mL Per Tube Q2H   lipase/protease/amylase  72,000 Units Oral TID AC   loratadine   10 mg Oral Daily   metoprolol  succinate  12.5 mg Oral QHS   rifaximin   550 mg Oral TID   sodium chloride  flush  3 mL Intravenous Q12H   Warfarin - Pharmacist Dosing Inpatient   Does not apply q1600   Continuous Infusions:  feeding supplement (OSMOLITE 1.5 CAL) 1,000 mL (01/19/24 0537)   Diet: Diet Order             Diet clear liquid Room service appropriate? Yes; Fluid consistency: Thin  Diet effective now                    Data Reviewed: I have personally reviewed following labs and imaging studies ( see epic result tab) CBC: Recent Labs  Lab 01/16/24 0241 01/22/24 0222  WBC 6.5 7.2  NEUTROABS  --  3.3  HGB 10.5* 11.4*  HCT 32.0* 34.0*  MCV 89.1 87.0  PLT 288 338   CMP: Recent Labs  Lab 01/15/24 1631 01/16/24 0241 01/16/24 0241 01/17/24 0225 01/18/24 0224 01/19/24 0542 01/20/24 0443 01/22/24 0222  NA  --  139   < > 142 137 140 139 138  K  --  4.0   < > 3.9 4.5 4.6 4.3 4.0  CL  --  102   < > 104 105 102 101 101  CO2  --  28   < > 28 28 28 30 28   GLUCOSE  --  108*   < > 106* 110* 103* 120* 102*  BUN  --  9   < > 9 9 15 18 16   CREATININE  --  0.72   < > 0.68 0.64 0.69 0.71 0.74  CALCIUM   --  9.0   < > 8.9 8.9 9.7 9.4 9.5  MG 1.8 1.7  --  1.8 2.1 2.1  --   --   PHOS 3.9 4.7*  --  3.8 4.3 4.7*  --   --    < > = values in this interval not displayed.   GFR: Estimated Creatinine Clearance: 122.6 mL/min (by C-G formula based on SCr of 0.74 mg/dL). Recent Labs  Lab 01/17/24 0225 01/18/24 0224 01/19/24 0542 01/20/24 0443 01/22/24 0222  AST 16  16 18 19 25   ALT 18 17 19 19 26   ALKPHOS 62 68 87 69 66  BILITOT 0.4 <0.2 0.4 0.3 0.2  PROT 6.4* 6.5 7.8 7.1 7.0  ALBUMIN  3.2* 3.3* 4.2 3.6 3.6    Recent Labs  Lab 01/22/24 0222  LIPASE 155*    No results for input(s): AMMONIA in the last 168 hours. Coagulation Profile:  Recent Labs  Lab 01/18/24 0224 01/19/24 0542 01/20/24 0443 01/21/24 0333 01/22/24 0222  INR 1.2 1.1 1.2 1.4* 2.0*   Unresulted Labs (From admission, onward)     Start     Ordered   01/12/24 0500  Protime-INR  Daily,   R  01/11/24 9072           Antimicrobials/Microbiology: Anti-infectives (From admission, onward)    Start     Dose/Rate Route Frequency Ordered Stop   01/18/24 1230  rifaximin  (XIFAXAN ) tablet 550 mg        550 mg Oral 3 times daily 01/18/24 1132 02/01/24 0959      No results found for: SDES, SPECREQUEST, CULT, REPTSTATUS  Procedures:  Mennie LAMY, MD [facility] 01/22/2024, 12:00 PM

## 2024-01-22 NOTE — Plan of Care (Signed)
   Problem: Clinical Measurements: Goal: Ability to maintain clinical measurements within normal limits will improve Outcome: Progressing Goal: Will remain free from infection Outcome: Progressing Goal: Diagnostic test results will improve Outcome: Progressing   Problem: Coping: Goal: Level of anxiety will decrease Outcome: Progressing

## 2024-01-22 NOTE — Progress Notes (Addendum)
 Patient ID: [name] [name], male   DOB: 14-Aug-1990, 33 y.o.   MRN: [id number]    Progress Note   Subjective   Day # 11 CC; acute on chronic calcific pancreatitis  Chronic Coumadin  Tube feeds on hold  Plan abdominal film yesterday stomach distended with ingested material and fluid, enteric tube in place no significant bowel distention  Labs today pro time 23.5/INR 2 Lipase 155 Potassium 4.0/BUN 16/creatinine 0.74/LFTs within normal limits WBC 7.2/hemoglobin 11.4/hematocrit 34.0 stable  No significant pain, still feels nauseated has not vomited but feels he may need to, worried about dislodging the cortrak.   Objective   Vital signs in last 24 hours: Temp:  [97.7 F (36.5 C)-99 F (37.2 C)] 98 F (36.7 C) (08/13 0755) Pulse Rate:  [59-77] 74 (08/13 0755) Resp:  [17-18] 17 (08/13 0755) BP: (113-141)/(74-96) 123/82 (08/13 0755) SpO2:  [97 %-100 %] 97 % (08/13 0755) Last BM Date : 01/19/24 General:    Young white male  in NAD thin Heart:  Regular rate and rhythm; no murmurs Lungs: Respirations even and unlabored, lungs CTA bilaterally Abdomen:  Soft, nontender and nondistended. Normal bowel sounds. Extremities:  Without edema. Neurologic:  Alert and oriented,  grossly normal neurologically. Psych:  Cooperative. Normal mood and affect.  Intake/Output from previous day: 08/12 0701 - 08/13 0700 In: 200 [P.O.:120; NG/GT:80] Out: -  Intake/Output this shift: No intake/output data recorded.  Lab Results: Recent Labs    01/22/24 0222  WBC 7.2  HGB 11.4*  HCT 34.0*  PLT 338   BMET Recent Labs    01/20/24 0443 01/22/24 0222  NA 139 138  K 4.3 4.0  CL 101 101  CO2 30 28  GLUCOSE 120* 102*  BUN 18 16  CREATININE 0.71 0.74  CALCIUM  9.4 9.5   LFT Recent Labs    01/22/24 0222  PROT 7.0  ALBUMIN  3.6  AST 25  ALT 26  ALKPHOS 66  BILITOT 0.2   PT/INR Recent Labs    01/21/24 0333 01/22/24 0222  LABPROT 18.3* 23.5*  INR 1.4* 2.0*     Studies/Results: DG Abd 2 Views Result Date: 01/21/2024 CLINICAL DATA:  Nausea.  Left-sided abdominal pain. EXAM: ABDOMEN - 2 VIEW COMPARISON:  Radiographs 01/15/2024.  CT 01/11/2024. FINDINGS: The stomach is distended with ingested material and fluid. Enteric tube follows a course consistent with tip near the ligament of Treitz. No significant bowel distension. No evidence of pneumoperitoneum. Pancreatic calcifications are again noted consistent with chronic pancreatitis. The bones appear unchanged. IMPRESSION: The stomach is distended with ingested material and fluid. No evidence of bowel obstruction or pneumoperitoneum. Electronically Signed   By: Elsie Perone M.D.   On: 01/21/2024 13:59       Assessment / Plan:    #30 33 year old male with history of chronic calcific pancreatitis secondary to EtOH-now abstinent over the past 10 months with acute exacerbation of pancreatitis, associated fluid collection and acute kidney injury on admit.  Required core track placement last week due to poor oral intake, however thereafter his diet was able to be advanced to solid food which she tolerated for 2 to 3 days then started feeling nauseated and uncomfortable. Tube feeds have been on hold but core track still in place  Changed to liquid diet yesterday Plain films showed distended stomach with a lot of retained contents  Concerned about element of partial gastric outlet or duodenal obstruction secondary to enlarging peripancreatic fluid collection  #2 CFTR gene mutation present #3 status  post aortic valve replacement/mechanical on chronic Coumadin  #4 hypertension #5 completing course of Xifaxan  here after outpatient SIBO test positive  Plan; continue clear liquids Have scheduled for urgent CT abdomen pelvis today Depending on results of CT we may need to consider EGD Will keep cortrack in place, will decide regarding resuming once we review CT result.     Principal Problem:   Chronic  recurrent pancreatitis (HCC) Active Problems:   S/P AVR (aortic valve replacement) and aortoplasty   Subtherapeutic international normalized ratio (INR)   Essential hypertension   Pseudocyst of pancreas   On pre-exposure prophylaxis for HIV   History of asthma   History of alcohol abuse   Moderate malnutrition (HCC)   Protein-calorie malnutrition, severe   Small intestinal bacterial overgrowth (SIBO)     LOS: 11 days   Amy EsterwoodPA-C  01/22/2024, 9:37 AM   Attending physician's note   I have reviewed the chart and didn't examine the patient.  He is off the floor to CT. I performed a substantive portion of this encounter, including complete performance of at least one of the key components, in conjunction with the APP. I agree with the Advanced Practitioner's note, impression and recommendations.  Reviewed x-ray KUB.  Chronic calcific pancreatitis likely d/t ETOH with PD dil/stones. +CTFR mutation of unknown significance.  Scheduled for outpatient ERCP with Dr CAFFIE 9/29. Gastric outlet obstruction- likely d/t duodenitis vs pseudocyst. Neg EGD at time for EUS 07/2023 Mechanical AVR on chronic Coumadin . Outpt +SIBO on rifaximin    Plan: -CT AP today -Clear liquid diet for now -Keep cortrak tube in.  -D/C bentyl .  Minimize pain meds. -Will follow along.   Anselm Bring, MD [facility] [phone]

## 2024-01-22 NOTE — Plan of Care (Signed)

## 2024-01-22 NOTE — Progress Notes (Signed)
 PHARMACY - ANTICOAGULATION CONSULT NOTE  Pharmacy Consult for warfarin Indication: On-X mechanical AVR  Allergies  Allergen Reactions   Amoxicillin Hives    Patient Measurements: Height: 5' 11 (180.3 cm) Weight: 66 kg (145 lb 9.6 oz) IBW/kg (Calculated) : 75.3 HEPARIN  DW (KG): 71  Vital Signs: Temp: 98.5 F (36.9 C) (08/13 1222) BP: 135/88 (08/13 1222) Pulse Rate: 76 (08/13 1222)  Labs: Recent Labs    01/20/24 0443 01/21/24 0333 01/22/24 0222  HGB  --   --  11.4*  HCT  --   --  34.0*  PLT  --   --  338  LABPROT 15.6* 18.3* 23.5*  INR 1.2 1.4* 2.0*  CREATININE 0.71  --  0.74    Estimated Creatinine Clearance: 122.6 mL/min (by C-G formula based on SCr of 0.74 mg/dL).   Medical History: Past Medical History:  Diagnosis Date   Asthma    Headache    migraines   Heart murmur    Hypokalemia 06/08/2020   Leukocytosis 01/23/2022   Mild aortic stenosis    Pancreatitis    Pneumonia    as a child x several   Substance abuse (HCC)     Medications:  Scheduled:   aspirin  EC  81 mg Oral Daily   feeding supplement (PROSource TF20)  60 mL Per Tube Daily   free water   50 mL Per Tube Q2H   lipase/protease/amylase  72,000 Units Oral TID AC   loratadine   10 mg Oral Daily   metoprolol  succinate  12.5 mg Oral QHS   pantoprazole  (PROTONIX ) IV  40 mg Intravenous Q12H   rifaximin   550 mg Oral TID   sodium chloride  flush  3 mL Intravenous Q12H   Warfarin - Pharmacist Dosing Inpatient   Does not apply q1600    Assessment: 33 yo M presenting with pancreatitis. Warfarin PTA regimen is 6mg  Monday and Thursday and 4.5 mg all other days (TWD: 34.5mg ).  INR is therapeutic today after increased warfarin doses.  Will discontinue Lovenox .  Pt also reporting increased nausea.  GI has ordered CT abd (pending).  Of note, patient started rifaximin  8/9 which may decrease INR.   Goal of Therapy:  INR 1.5-2 Monitor platelets by anticoagulation protocol: Yes   Plan:  Warfarin 2.5  mg PO x1 at 1600 Discontinue enoxaparin  Monitor INR daily Monitor for drug interactions   [employer], Pharm.D., BCPS Clinical Pharmacist Clinical phone for 01/22/2024 from 7:30-3:00 is [phone].  **Pharmacist phone directory can be found on [email or web address] listed under [facility].  01/22/2024 3:01 PM

## 2024-01-23 ENCOUNTER — Inpatient Hospital Stay (HOSPITAL_COMMUNITY)

## 2024-01-23 DIAGNOSIS — K311 Adult hypertrophic pyloric stenosis: Secondary | ICD-10-CM | POA: Diagnosis not present

## 2024-01-23 DIAGNOSIS — E43 Unspecified severe protein-calorie malnutrition: Secondary | ICD-10-CM

## 2024-01-23 DIAGNOSIS — K861 Other chronic pancreatitis: Secondary | ICD-10-CM | POA: Diagnosis not present

## 2024-01-23 DIAGNOSIS — K863 Pseudocyst of pancreas: Secondary | ICD-10-CM | POA: Diagnosis not present

## 2024-01-23 DIAGNOSIS — K638219 Small intestinal bacterial overgrowth, unspecified: Secondary | ICD-10-CM

## 2024-01-23 DIAGNOSIS — Z952 Presence of prosthetic heart valve: Secondary | ICD-10-CM | POA: Diagnosis not present

## 2024-01-23 DIAGNOSIS — K859 Acute pancreatitis without necrosis or infection, unspecified: Secondary | ICD-10-CM | POA: Diagnosis not present

## 2024-01-23 DIAGNOSIS — K8681 Exocrine pancreatic insufficiency: Secondary | ICD-10-CM | POA: Diagnosis not present

## 2024-01-23 LAB — GLUCOSE, CAPILLARY
Glucose-Capillary: 108 mg/dL — ABNORMAL HIGH (ref 70–99)
Glucose-Capillary: 116 mg/dL — ABNORMAL HIGH (ref 70–99)
Glucose-Capillary: 119 mg/dL — ABNORMAL HIGH (ref 70–99)
Glucose-Capillary: 122 mg/dL — ABNORMAL HIGH (ref 70–99)
Glucose-Capillary: 148 mg/dL — ABNORMAL HIGH (ref 70–99)
Glucose-Capillary: 85 mg/dL (ref 70–99)

## 2024-01-23 LAB — PROTIME-INR
INR: 2.4 — ABNORMAL HIGH (ref 0.8–1.2)
Prothrombin Time: 27.6 s — ABNORMAL HIGH (ref 11.4–15.2)

## 2024-01-23 MED ORDER — GADOBUTROL 1 MMOL/ML IV SOLN
6.5000 mL | Freq: Once | INTRAVENOUS | Status: AC | PRN
  Administered 2024-01-23: 6.5 mL via INTRAVENOUS

## 2024-01-23 MED ORDER — WARFARIN SODIUM 1 MG PO TABS
1.0000 mg | ORAL_TABLET | Freq: Once | ORAL | Status: AC
  Administered 2024-01-23: 1 mg via ORAL
  Filled 2024-01-23: qty 1

## 2024-01-23 NOTE — Progress Notes (Signed)
 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 +CTFR mutation of unknown significance.  Scheduled for outpatient ERCP with Dr CAFFIE 9/29.  Partial gastric outlet obstruction- likely d/t symptomatic pseudocyst. Neg EGD at time for EUS 07/2023.  Tolerating tube feeds well.  Tolerating full liquid diet as well.  Mechanical AVR on chronic Coumadin .  Outpt +SIBO on rifaximin    Plan: - MRCP today - Check CA 19-9, CEA level - Would ask Dr.Mansouraty to weigh in after MRCP - Given above findings, I believe he would benefit from surgical cystduodenostomy with P[place]ow procedure. We asked Dr. Leonor Dawn to consult. - After MRCP, resume tube feeds.     SUBJECTIVE   Still nauseated but better than yesterday. Denies having any vomiting CT reviewed    OBJECTIVE:     Vital signs in last 24 hours: Temp:  [97.5 F (36.4 C)-98.4 F (36.9 C)] 97.5 F (36.4 C) (08/14 1221) Pulse Rate:  [79-94] 89 (08/14 1221) Resp:  [16-20] 16 (08/14 1221) BP: (112-129)/(78-90) 115/81 (08/14 1221) SpO2:  [97 %-100 %] 98 % (08/14 1221) Weight:  [66 kg] 66 kg (08/14 0500) Last BM Date : 01/21/24 General:   Alert, well-developed, in NAD EENT:  Normal hearing, non icteric sclera, conjunctive pink.  Abdomen:  Soft, nondistended, nontender.  Normal bowel sounds,.       Neurologic:  Alert and  oriented x4;  grossly normal neurologically. Psych:  Pleasant, cooperative.  Normal mood and affect.   Intake/Output from previous day: 08/13 0701 - 08/14 0700 In: 500 [NG/GT:500] Out: -  Intake/Output this shift: Total I/O In: 100 [NG/GT:100] Out: -   Lab Results: Recent Labs    01/22/24 0222  WBC 7.2  HGB 11.4*  HCT 34.0*  PLT 338   BMET Recent Labs    01/22/24 0222  NA 138  K 4.0  CL 101  CO2 28  GLUCOSE 102*  BUN 16  CREATININE 0.74  CALCIUM  9.5   LFT Recent Labs     01/22/24 0222  PROT 7.0  ALBUMIN  3.6  AST 25  ALT 26  ALKPHOS 66  BILITOT 0.2   PT/INR Recent Labs    01/22/24 0222 01/23/24 0226  LABPROT 23.5* 27.6*  INR 2.0* 2.4*   Hepatitis Panel No results for input(s): HEPBSAG, HCVAB, HEPAIGM, HEPBIGM in the last 72 hours.  CT ABDOMEN PELVIS W CONTRAST Result Date: 01/22/2024 CLINICAL DATA:  Persistent acute on chronic pancreatitis, recurrent nausea and gastric distension EXAM: CT ABDOMEN AND PELVIS WITH CONTRAST TECHNIQUE: Multidetector CT imaging of the abdomen and pelvis was performed using the standard protocol following bolus administration of intravenous contrast. RADIATION DOSE REDUCTION: This exam was performed according to the departmental dose-optimization program which includes automated exposure control, adjustment of the mA and/or kV according to patient size and/or use of iterative reconstruction technique. CONTRAST:  75mL OMNIPAQUE  IOHEXOL  350 MG/ML SOLN COMPARISON:  CT abdomen pelvis January 11, 2024 FINDINGS: Lower chest: No suspicious nodule. Sternotomy. No pleural effusion. Hepatobiliary: Focal fat in segment 4 typical location. Subhepatic fluid collection is slightly decreased to prior. Diminished fat stranding and fluid collection in porta hepatis. Gallbladder is unremarkable.  No intrahepatic biliary dilatation. Pancreas: Again seen there is significant dilation of the pancreatic duct up to 10.6 mm, similar to prior. Multiple foci of coarse calcifications within the pancreatic head suggestive of chronic pancreatitis. Diffuse enhancement throughout the pancreas without  filling defect. Peripancreatic fat stranding and fluid collection extending to perigastric is similar to slightly improved to prior. There is a rim enhancing hour glass shaped low-density/multiloculated cystic structure measuring 3.6 x 2.1 cm along the anterior aspect of the pancreatic head extending toward the posterior aspect of gastric antrum. Extension to  gastric wall cannot be excluded on current exam. On review of prior images this finding was barely visible and was measured approximately 1 cm. (3/35, 33) Spleen: Normal spleen. Adrenals/Urinary Tract: Adrenal glands are unremarkable. No hydronephrosis. Both kidneys are appearing normal. Under distended otherwise unremarkable bladder. Stomach and bowel: Interval placement of gastrojejunostomy enteric tube tip terminating in proximal jejunal loops. Stomach is not distended. Perigastric inflammatory changes and fluid collection slightly improved to prior. Inflammatory changes and fat stranding identified along the omentum. Small and large bowel loops are unremarkable. Appendix is normal. Vascular/Lymphatic/ascites: Trace free fluid in pelvic cavity and perihepatic, decreased to prior. Persistent mild narrowing of the splenic vein just proximal to the confluence, otherwise patent. Portal vein is patent. Remainder of the vascular structures are unremarkable without narrowing or obstruction. No suspicious lymphadenopathy. Reproductive: Unremarkable prostate. Musculoskeletal: No suspicious osseous lesions. IMPRESSION: 1. Dilation of the pancreatic duct and coarse calcifications in the pancreatic head consistent with stigmata of chronic pancreatitis, similar to prior. Normal pancreatic enhancement. 2. Interval increase in size of a multiloculated fluid collection with rim enhancement anterior to the pancreatic neck extending to posterior wall of the gastric antrum , most consistent with peripancreatic abscess formation. Gastric wall connection cannot be excluded. Recommend future follow-ups with dedicated MRCP with contrast if clinically warranted. 3.  Decreased ascites and perigastric/perihepatic free fluid. 4. Gastrojejunostomy tube terminates in proximal jejunum. Normal gastric size. Electronically Signed   By: Megan  Zare M.D.   On: 01/22/2024 15:01   DG Abd 2 Views Result Date: 01/21/2024 CLINICAL DATA:  Nausea.   Left-sided abdominal pain. EXAM: ABDOMEN - 2 VIEW COMPARISON:  Radiographs 01/15/2024.  CT 01/11/2024. FINDINGS: The stomach is distended with ingested material and fluid. Enteric tube follows a course consistent with tip near the ligament of Treitz. No significant bowel distension. No evidence of pneumoperitoneum. Pancreatic calcifications are again noted consistent with chronic pancreatitis. The bones appear unchanged. IMPRESSION: The stomach is distended with ingested material and fluid. No evidence of bowel obstruction or pneumoperitoneum. Electronically Signed   By: Elsie Perone M.D.   On: 01/21/2024 13:59     Principal Problem:   Chronic recurrent pancreatitis (HCC) Active Problems:   S/P AVR (aortic valve replacement) and aortoplasty   Subtherapeutic international normalized ratio (INR)   Essential hypertension   Pseudocyst of pancreas   On pre-exposure prophylaxis for HIV   History of asthma   History of alcohol abuse   Moderate malnutrition (HCC)   Protein-calorie malnutrition, severe   Small intestinal bacterial overgrowth (SIBO)     LOS: 12 days     Anselm Bring, MD 01/23/2024, 1:01 PM [facility] [phone]

## 2024-01-23 NOTE — Progress Notes (Signed)
 Nutrition Follow-up  DOCUMENTATION CODES:   Severe malnutrition in context of chronic illness  INTERVENTION:  -Continue full liquid diet as tolerated -EN to be re-initiated today after MRCP is completed -Continue ProSource Plus BID to promote adequate protein with low fat content -Discussed goal of increased kcal/pro intake to prevent further weight loss, fuel body   NUTRITION DIAGNOSIS:   Severe Malnutrition related to vomiting, chronic illness, altered GI function as evidenced by moderate fat depletion, severe muscle depletion.  Ongoing  GOAL:   Patient will meet greater than or equal to 90% of their needs  Progressing  MONITOR:   PO intake, Weight trends, TF tolerance, Supplement acceptance, Diet advancement, Labs, Skin  REASON FOR ASSESSMENT:   New TF, Diagnosis    ASSESSMENT:   Hx hypertension, bicuspid aortic valve with severe aortic insufficiency and moderate aortic stenosis s/p mechanical aortic valve replacement in arthroplasty on 09/27/2021 on chronic anticoagulation, recurrent pancreatitis, asthma, GERD, and prior history of alcohol abuse presents with abdominal pain.  Pt was tolerating soft diet and then started to experience abdominal pain, nausea again. Pt back down to full liquids and EN to be re-initiated today. Will start Osmolite 1.5 @ 20 ml/hr to be advanced as tolerated 10 ml/hr q-6 hr to a goal range of 60 ml/hr via Cortrak. Continue PS x2. Pt states that GI spoke to him today and discussed possibility of surgical cystduodenostomy with P[place]ow procedure for his partial gastric outlet obstruction likely d/t symptomatic pseudocyst. Pt also stated GI said he may d/c with tube feedings. Discussed home enteral nutrition, potential long term enteral nutrition and hypothetically what that would entail if that ever became necessary. Pt verbalized understanding. He does say he feels a bit better today than he did yesterday. Scheduled for MRCP today, EN to start  afterwards. Will continue to monitor for results of MRCP and following POC. Pt denies additional questions/concerns at this time, RDN available prn.   Labs BG 102-119 Lipase 155 H/H 11.4/34.0  Medications  aspirin  EC  81 mg Oral Daily   feeding supplement (PROSource TF20)  60 mL Per Tube Daily   free water   50 mL Per Tube Q2H   lipase/protease/amylase  72,000 Units Oral TID AC   loratadine   10 mg Oral Daily   metoprolol  succinate  12.5 mg Oral QHS   pantoprazole  (PROTONIX ) IV  40 mg Intravenous Q12H   rifaximin   550 mg Oral TID   sodium chloride  flush  3 mL Intravenous Q12H   warfarin  1 mg Oral ONCE-1600   Warfarin - Pharmacist Dosing Inpatient   Does not apply q1600     NUTRITION - FOCUSED PHYSICAL EXAM:  Flowsheet Row Most Recent Value  Orbital Region Moderate depletion  Upper Arm Region Moderate depletion  Thoracic and Lumbar Region Moderate depletion  Buccal Region Moderate depletion  Temple Region Moderate depletion  Clavicle Bone Region Severe depletion  Clavicle and Acromion Bone Region Severe depletion  Scapular Bone Region Moderate depletion  Dorsal Hand Moderate depletion  Patellar Region Moderate depletion  Anterior Thigh Region Moderate depletion  Posterior Calf Region Moderate depletion  Edema (RD Assessment) None  Hair Reviewed  Eyes Reviewed  Mouth Reviewed  Skin Reviewed  Nails Reviewed    Diet Order:   Diet Order             Diet full liquid Room service appropriate? Yes; Fluid consistency: Thin  Diet effective now  EDUCATION NEEDS:   Education needs have been addressed  Skin:  Skin Assessment: Reviewed RN Assessment  Last BM:  8/12 type1  Height:   Ht Readings from Last 1 Encounters:  01/11/24 5' 11 (1.803 m)    Weight:   Wt Readings from Last 1 Encounters:  01/23/24 66 kg   BMI:  Body mass index is 20.31 kg/m.  Estimated Nutritional Needs:   Kcal:  2000-2350 kcal  Protein:  105-140 g  Fluid:   >/=2L  [name] [name], RDN, LDN Registered Dietitian Nutritionist RD Inpatient Contact Info in [place]

## 2024-01-23 NOTE — Assessment & Plan Note (Addendum)
 Case discussed with gastroenterology since he is not having a fever, he does not believe that this is an abscess.  Continue to hold off on antibiotics.

## 2024-01-23 NOTE — Progress Notes (Signed)
 Progress Note   Patient: [name] [name] FMW:[id number] DOB: May 14, 1991 DOA: 01/11/2024     12 DOS: the patient was seen and examined on 01/23/2024   Brief hospital course: 33 y.o. male with medical history significant of hypertension, bicuspid aortic valve with severe aortic insufficiency and moderate aortic stenosis s/p mechanical AV replacement on 09/27/2021 on chronic anticoagulation, recurrent pancreatitis, asthma, GERD, and prior history of alcohol abuse presents with abdominal pain found to have AKI pancreatitis with pseudocyst, GI was consulted and admitted for conservative management. Patient had multiple hospitalization for pancreatitis. Patient with decreased tolerance to diet and w/ severe malnutrition> cortrak placed post pyloric on 01/15/24, diet orally was slowly advanced  8/13.  Patient with dilation of the pancreatic duct with calcifications consistent with chronic pancreatitis, increase in the multi loculated fluid collection, decreased ascites 8/14.  Case discussed with gastroenterology.  MRCP of the abdomen ordered.  Okay to start tube feeding after MRCP done.   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  Continued abdominal pain.  CT scan showed very dilated pancreatic duct.  MRCP ordered.  Cortrak tube placed and can start feeding after MRCP.  Liquid diet.  Case discussed with gastroenterology.  Has outpatient ERCP for September.  Pseudocyst of pancreas Patient is symptomatic.  Case discussed with gastroenterology since he is not having a fever, he does not believe that this is an abscess.  Continue to hold off on antibiotics.  Small intestinal bacterial overgrowth (SIBO) On rifaximin  for 14 days  S/P AVR (aortic valve replacement) and aortoplasty Continue Coumadin   Essential hypertension On Toprol   History of alcohol abuse Remission for 10 months  Protein-calorie malnutrition, severe Tube feeds         Subjective: Patient still having some abdominal pain.  Admitted with acute on chronic pancreatitis.  No longer drinks alcohol.  Physical Exam: Vitals:   01/23/24 0354 01/23/24 0500 01/23/24 0828 01/23/24 1221  BP: 112/78  126/87 115/81  Pulse: 92  94 89  Resp: 20  18 16   Temp: 98.4 F (36.9 C)  (!) 97.5 F (36.4 C) (!) 97.5 F (36.4 C)  TempSrc:      SpO2: 97%  100% 98%  Weight:  66 kg    Height:       Physical Exam HENT:     Head: Normocephalic.     Mouth/Throat:     Pharynx: No oropharyngeal exudate.  Eyes:     General: Lids are normal.     Conjunctiva/sclera: Conjunctivae normal.  Cardiovascular:     Rate and Rhythm: Normal rate and regular rhythm.     Heart sounds: Normal heart sounds, S1 normal and S2 normal.  Pulmonary:     Breath sounds: No decreased breath sounds, wheezing, rhonchi or rales.  Abdominal:     Palpations: Abdomen is soft.     Tenderness: There is abdominal tenderness in the epigastric area.  Musculoskeletal:     Right lower leg: No swelling.     Left lower leg: No swelling.  Skin:    General: Skin is warm.     Findings: No rash.  Neurological:     Mental Status: He is alert and oriented to person, place, and time.     Data Reviewed: CT scan reviewed above Creatinine 0.74, lipase 155 liver function test normal, white blood cell count 7.2, hemoglobin 11.4, platelet count 338, INR 2.4   Disposition: Status is: Inpatient Remains inpatient appropriate because: MRCP ordered  Planned  Discharge Destination: Home    Time spent: 28 minutes Case discussed with gastroenterology  Author: Charlie Patterson, MD 01/23/2024 2:34 PM  For on call review [email or web address].

## 2024-01-23 NOTE — Assessment & Plan Note (Signed)
 On Toprol

## 2024-01-23 NOTE — Plan of Care (Signed)

## 2024-01-23 NOTE — Assessment & Plan Note (Addendum)
 Nutrition Status: Nutrition Problem: Severe Malnutrition Etiology: vomiting, chronic illness, altered GI function Signs/Symptoms: moderate fat depletion, severe muscle depletion Interventions: Refer to RD note for recommendations

## 2024-01-23 NOTE — Assessment & Plan Note (Signed)
Continue Coumadin. 

## 2024-01-23 NOTE — Assessment & Plan Note (Signed)
 On rifaximin  for 14 days

## 2024-01-23 NOTE — Assessment & Plan Note (Signed)
 Remission for 10 months

## 2024-01-23 NOTE — Progress Notes (Signed)
 PHARMACY - ANTICOAGULATION CONSULT NOTE  Pharmacy Consult for warfarin Indication: On-X mechanical AVR  Allergies  Allergen Reactions   Amoxicillin Hives    Patient Measurements: Height: 5' 11 (180.3 cm) Weight: 66 kg (145 lb 9.6 oz) IBW/kg (Calculated) : 75.3 HEPARIN  DW (KG): 71  Vital Signs: Temp: 97.5 F (36.4 C) (08/14 1221) BP: 115/81 (08/14 1221) Pulse Rate: 89 (08/14 1221)  Labs: Recent Labs    01/21/24 0333 01/22/24 0222 01/23/24 0226  HGB  --  11.4*  --   HCT  --  34.0*  --   PLT  --  338  --   LABPROT 18.3* 23.5* 27.6*  INR 1.4* 2.0* 2.4*  CREATININE  --  0.74  --     Estimated Creatinine Clearance: 122.6 mL/min (by C-G formula based on SCr of 0.74 mg/dL).   Medical History: Past Medical History:  Diagnosis Date   Asthma    Headache    migraines   Heart murmur    Hypokalemia 06/08/2020   Leukocytosis 01/23/2022   Mild aortic stenosis    Pancreatitis    Pneumonia    as a child x several   Substance abuse (HCC)     Medications:  Scheduled:   aspirin  EC  81 mg Oral Daily   feeding supplement (PROSource TF20)  60 mL Per Tube Daily   free water   50 mL Per Tube Q2H   lipase/protease/amylase  72,000 Units Oral TID AC   loratadine   10 mg Oral Daily   metoprolol  succinate  12.5 mg Oral QHS   pantoprazole  (PROTONIX ) IV  40 mg Intravenous Q12H   rifaximin   550 mg Oral TID   sodium chloride  flush  3 mL Intravenous Q12H   Warfarin - Pharmacist Dosing Inpatient   Does not apply q1600    Assessment: 33 yo M presenting with pancreatitis. Warfarin PTA regimen is 6mg  Monday and Thursday and 4.5 mg all other days (TWD: 34.5mg ).  INR is supratherapeutic after increased warfarin doses.  CT abd with increased fluid collection at head of pancrease concerning for peripancreatic abscess.  Plan for MRCP today.  Hesitant to hold warfarin today given previous INR drop to 1.2 following a held dose.  Will reduce to 1mg  tonight.  Of note, patient started  rifaximin  8/9 which may decrease INR.   Goal of Therapy:  INR 1.5-2 Monitor platelets by anticoagulation protocol: Yes   Plan:  Warfarin 1 mg PO x1 at 1600 Monitor INR daily Monitor for drug interactions   [employer], Pharm.D., BCPS Clinical Pharmacist Clinical phone for 01/23/2024 from 7:30-3:00 is [phone].  **Pharmacist phone directory can be found on [email or web address] listed under [facility].  01/23/2024 1:10 PM

## 2024-01-23 NOTE — Assessment & Plan Note (Addendum)
 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  Continued abdominal pain.  CT scan showed very dilated pancreatic duct.  MRCP showing 2 fluid collections and a dilated pancreatic duct and findings consistent with acute pancreatitis.  Octreotide  started by gastroenterology.  Cortrak tube came out on 8/15 with an episode of vomiting.  Spoke with gastroenterology and they will hold off on replacing tube at this point.  Continue to full liquid diet.  Gastroenterology ordered a repeat CT scan for tomorrow.

## 2024-01-24 DIAGNOSIS — K859 Acute pancreatitis without necrosis or infection, unspecified: Secondary | ICD-10-CM | POA: Diagnosis not present

## 2024-01-24 DIAGNOSIS — K8681 Exocrine pancreatic insufficiency: Secondary | ICD-10-CM | POA: Diagnosis not present

## 2024-01-24 DIAGNOSIS — K863 Pseudocyst of pancreas: Secondary | ICD-10-CM | POA: Diagnosis not present

## 2024-01-24 DIAGNOSIS — R188 Other ascites: Secondary | ICD-10-CM

## 2024-01-24 DIAGNOSIS — K638219 Small intestinal bacterial overgrowth, unspecified: Secondary | ICD-10-CM | POA: Diagnosis not present

## 2024-01-24 DIAGNOSIS — K861 Other chronic pancreatitis: Secondary | ICD-10-CM | POA: Diagnosis not present

## 2024-01-24 DIAGNOSIS — K8689 Other specified diseases of pancreas: Secondary | ICD-10-CM | POA: Diagnosis not present

## 2024-01-24 LAB — GLUCOSE, CAPILLARY
Glucose-Capillary: 114 mg/dL — ABNORMAL HIGH (ref 70–99)
Glucose-Capillary: 109 mg/dL — ABNORMAL HIGH (ref 70–99)
Glucose-Capillary: 123 mg/dL — ABNORMAL HIGH (ref 70–99)

## 2024-01-24 LAB — PROTIME-INR
INR: 2.5 — ABNORMAL HIGH (ref 0.8–1.2)
Prothrombin Time: 28.6 s — ABNORMAL HIGH (ref 11.4–15.2)

## 2024-01-24 LAB — LIPASE, BLOOD: Lipase: 451 U/L — ABNORMAL HIGH (ref 11–51)

## 2024-01-24 LAB — CANCER ANTIGEN 19-9: CA 19-9: 8 U/mL (ref 0–35)

## 2024-01-24 LAB — CEA: CEA: <0.6 ng/mL (ref 0.0–4.7)

## 2024-01-24 MED ORDER — POLYETHYLENE GLYCOL 3350 17 G PO PACK
17.0000 g | PACK | Freq: Every day | ORAL | Status: DC
  Administered 2024-01-24: 17 g via ORAL
  Filled 2024-01-24 (×2): qty 1

## 2024-01-24 MED ORDER — WARFARIN SODIUM 2.5 MG PO TABS
2.5000 mg | ORAL_TABLET | Freq: Once | ORAL | Status: AC
  Administered 2024-01-24: 2.5 mg via ORAL
  Filled 2024-01-24: qty 1

## 2024-01-24 MED ORDER — OCTREOTIDE ACETATE 50 MCG/ML IJ SOLN
100.0000 ug | Freq: Two times a day (BID) | INTRAMUSCULAR | Status: DC
Start: 2024-01-24 — End: 2024-01-27
  Administered 2024-01-24 – 2024-01-26 (×4): 100 ug via SUBCUTANEOUS
  Filled 2024-01-24 (×5): qty 2

## 2024-01-24 NOTE — Progress Notes (Signed)
 Progress Note   Patient: [name] [name] FMW:[id number] DOB: 1990/06/27 DOA: 01/11/2024     13 DOS: the patient was seen and examined on 01/24/2024   Brief hospital course: 33 y.o. male with medical history significant of hypertension, bicuspid aortic valve with severe aortic insufficiency and moderate aortic stenosis s/p mechanical AV replacement on 09/27/2021 on chronic anticoagulation, recurrent pancreatitis, asthma, GERD, and prior history of alcohol abuse presents with abdominal pain found to have AKI pancreatitis with pseudocyst, GI was consulted and admitted for conservative management. Patient had multiple hospitalization for pancreatitis. Patient with decreased tolerance to diet and w/ severe malnutrition> cortrak placed post pyloric on 01/15/24, diet orally was slowly advanced  8/13.  Patient with dilation of the pancreatic duct with calcifications consistent with chronic pancreatitis, increase in the multi loculated fluid collection, decreased ascites 8/14.  Case discussed with gastroenterology.  MRCP of the abdomen ordered.  Okay to start tube feeding after MRCP done. 8/15.  Lipase up to 451.  MRCP showing diffuse inflammatory fat stranding about the pancreas consistent with acute pancreatitis.  Severe ductal dilation consistent with chronic stigmata of pancreatitis.  Interval development of a fluid collection 6.9 x 2.3 x 3.0 cm and a prior fluid collection 3.0 x 2.1 x 3.0 cm.   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  Continued abdominal pain.  CT scan showed very dilated pancreatic duct.  MRCP showing 2 fluid collections and a dilated pancreatic duct and findings consistent with acute pancreatitis.  Cortrak tube placed and on tube feeding.  Liquid diet.  GI following and surgery to see.  Pseudocyst of pancreas Case discussed with gastroenterology since he is not having a fever, he does not believe that this is an abscess.  Continue to  hold off on antibiotics.  Small intestinal bacterial overgrowth (SIBO) On rifaximin  for 14 days  S/P AVR (aortic valve replacement) and aortoplasty Continue Coumadin   Essential hypertension On Toprol   History of alcohol abuse Remission for 10 months  Protein-calorie malnutrition, severe Tube feeds        Subjective: Patient occasionally has pain.  Always has gurgling in his abdomen.  Tolerating tube feeds.  Tolerating liquids.  He is ambulating.  Admitted with acute pancreatitis.  Physical Exam: Vitals:   01/23/24 2259 01/24/24 0410 01/24/24 0810 01/24/24 1128  BP: 110/75 110/80 118/78 113/74  Pulse: 75 80 81 72  Resp: 18 18 18 18   Temp: 97.8 F (36.6 C) 98.3 F (36.8 C) 97.8 F (36.6 C)   TempSrc:   Oral   SpO2: 97% 100% 99% 98%  Weight:      Height:       Physical Exam HENT:     Head: Normocephalic.     Mouth/Throat:     Pharynx: No oropharyngeal exudate.  Eyes:     General: Lids are normal.     Conjunctiva/sclera: Conjunctivae normal.  Cardiovascular:     Rate and Rhythm: Normal rate and regular rhythm.     Heart sounds: Normal heart sounds, S1 normal and S2 normal.  Pulmonary:     Breath sounds: No decreased breath sounds, wheezing, rhonchi or rales.  Abdominal:     Palpations: Abdomen is soft.     Tenderness: There is abdominal tenderness in the epigastric area.  Musculoskeletal:     Right lower leg: No swelling.     Left lower leg: No swelling.  Skin:    General: Skin is warm.     Findings:  No rash.  Neurological:     Mental Status: He is alert and oriented to person, place, and time.     Data Reviewed: MRCP reviewed Lipase 451, INR 2.5  Disposition: Status is: Inpatient Remains inpatient appropriate because:   Planned Discharge Destination: Home    Time spent: 27 minutes  Author: Charlie Patterson, MD 01/24/2024 2:32 PM  For on call review [email or web address].

## 2024-01-24 NOTE — TOC Progression Note (Addendum)
 Transition of Care [facility]) - Progression Note    Patient Details  Name: [name] [name] MRN: [id number] Date of Birth: [birth year]  Transition of Care [facility]) CM/SW Contact  [name] JONELLE Joe, RN Phone Number: 01/24/2024, 12:08 PM  Clinical Narrative:    CM discussed with Dr. Kimble that patient will need to remain inpatient if Cortrak is needed.  Patient is unable to discharge home with Cortrak in place.  MD is aware.  CM with IP Care management will continue to follow the patient as patient progresses.                     Expected Discharge Plan and Services                                               Social Drivers of Health (SDOH) Interventions SDOH Screenings   Food Insecurity: No Food Insecurity (01/11/2024)  Housing: Low Risk  (01/11/2024)  Transportation Needs: No Transportation Needs (01/11/2024)  Utilities: Not At Risk (01/11/2024)  Depression (PHQ2-9): Medium Risk (09/09/2023)  Financial Resource Strain: Low Risk  (04/25/2023)  Physical Activity: Sufficiently Active (04/25/2023)  Social Connections: Unknown (04/25/2023)  Stress: No Stress Concern Present (04/25/2023)  Tobacco Use: Low Risk  (01/20/2024)    Readmission Risk Interventions    01/15/2024   11:03 AM  Readmission Risk Prevention Plan  Post Dischage Appt Complete  Medication Screening Complete  Transportation Screening Complete

## 2024-01-24 NOTE — Plan of Care (Signed)

## 2024-01-24 NOTE — Progress Notes (Signed)
 PHARMACY - ANTICOAGULATION CONSULT NOTE  Pharmacy Consult for warfarin Indication: On-X mechanical AVR  Allergies  Allergen Reactions   Amoxicillin Hives    Patient Measurements: Height: 5' 11 (180.3 cm) Weight: 66 kg (145 lb 9.6 oz) IBW/kg (Calculated) : 75.3 HEPARIN  DW (KG): 71  Vital Signs: Temp: 97.8 F (36.6 C) (08/15 0810) Temp Source: Oral (08/15 0810) BP: 118/78 (08/15 0810) Pulse Rate: 81 (08/15 0810)  Labs: Recent Labs    01/22/24 0222 01/23/24 0226 01/24/24 0256  HGB 11.4*  --   --   HCT 34.0*  --   --   PLT 338  --   --   LABPROT 23.5* 27.6* 28.6*  INR 2.0* 2.4* 2.5*  CREATININE 0.74  --   --     Estimated Creatinine Clearance: 122.6 mL/min (by C-G formula based on SCr of 0.74 mg/dL).   Medical History: Past Medical History:  Diagnosis Date   Asthma    Headache    migraines   Heart murmur    Hypokalemia 06/08/2020   Leukocytosis 01/23/2022   Mild aortic stenosis    Pancreatitis    Pneumonia    as a child x several   Substance abuse (HCC)     Medications:  Scheduled:   aspirin  EC  81 mg Oral Daily   feeding supplement (PROSource TF20)  60 mL Per Tube Daily   free water   50 mL Per Tube Q2H   lipase/protease/amylase  72,000 Units Oral TID AC   loratadine   10 mg Oral Daily   metoprolol  succinate  12.5 mg Oral QHS   pantoprazole  (PROTONIX ) IV  40 mg Intravenous Q12H   rifaximin   550 mg Oral TID   sodium chloride  flush  3 mL Intravenous Q12H   Warfarin - Pharmacist Dosing Inpatient   Does not apply q1600    Assessment: 33 yo M presenting with pancreatitis. Warfarin PTA regimen is 6mg  Monday and Thursday and 4.5 mg all other days (TWD: 34.5mg ).  INR is supratherapeutic after increased warfarin doses.  CT abd with increased fluid collection at head of pancrease concerning for peripancreatic abscess.  MRCP completed 8/14 and tube feeds restarted.  Anticipate INR will start to trend down given lower warfarin doses and resumption of tube  feeds.    Of note, patient started rifaximin  8/9 which may decrease INR.   Goal of Therapy:  INR 1.5-2 Monitor platelets by anticoagulation protocol: Yes   Plan:  Warfarin 2.5 mg PO x1 at 1600 Monitor INR daily Monitor for drug interactions   [employer], Pharm.D., BCPS Clinical Pharmacist Clinical phone for 01/24/2024 from 7:30-3:00 is [phone].  **Pharmacist phone directory can be found on [email or web address] listed under [facility].  01/24/2024 8:18 AM

## 2024-01-24 NOTE — Progress Notes (Signed)
 Progress Note    ASSESSMENT AND PLAN:   Acute pancreatitis on chronic calcific pancreatitis with marked PD dilation.  Doubt PD disruption  Acute peripancreatic fluid collections (worsening)- New 6.9 x 2.3 x 3.0 cm fluid collection in the lesser sac.  Unchanged 3 x 2 x 3 cm fluid collection at [facility] with severe inflammation gastric antrum/pylorus.  Partial gastric outlet obstruction d/t above.  Mechanical AVR on chronic Coumadin   Plan: -IVF/pain control/nausea Rx -Continue cortrak tube feeds -Start octreotide . -Recheck CBC, CMP, lipase in AM. -Rpt CT pancreatic protocol early next week.  -D/W Dr Wilhelmenia -Await Dr Verla recommendations.    SUBJECTIVE   Continued nausea with occasional abdominal pain No vomiting Was not pleased to hear that he cannot go home yet  MRCP results discussed with patient in detail.  I also showed him the actual films.  MRCP showing diffuse inflammatory fat stranding about the pancreas consistent with acute pancreatitis. Severe ductal dilation consistent with chronic stigmata of pancreatitis. Interval development of a fluid collection 6.9 x 2.3 x 3.0 cm and a prior fluid collection 3.0 x 2.1 x 3.0 cm with severe gastroduodenitis.     OBJECTIVE:     Vital signs in last 24 hours: Temp:  [97.8 F (36.6 C)-98.3 F (36.8 C)] 97.8 F (36.6 C) (08/15 0810) Pulse Rate:  [72-89] 72 (08/15 1128) Resp:  [17-18] 18 (08/15 1128) BP: (110-118)/(74-80) 113/74 (08/15 1128) SpO2:  [97 %-100 %] 98 % (08/15 1128) Last BM Date : 01/21/24 General:   Alert, well-developed, in NAD EENT:  Normal hearing, non icteric sclera, conjunctive pink.  Heart:  Regular rate and rhythm; no murmur.  No lower extremity edema   Pulm: Normal respiratory effort, lungs CTA bilaterally without wheezes or crackles. Abdomen:  Soft, nondistended, Mild epi tender   Neurologic:  Alert and  oriented x4;  grossly normal neurologically. Psych:  Pleasant, cooperative.  Normal  mood and affect.   Intake/Output from previous day: 08/14 0701 - 08/15 0700 In: 827.5 [P.O.:200; NG/GT:627.5] Out: -  Intake/Output this shift: Total I/O In: 610 [P.O.:360; NG/GT:250] Out: -   Lab Results: Recent Labs    01/22/24 0222  WBC 7.2  HGB 11.4*  HCT 34.0*  PLT 338   BMET Recent Labs    01/22/24 0222  NA 138  K 4.0  CL 101  CO2 28  GLUCOSE 102*  BUN 16  CREATININE 0.74  CALCIUM  9.5   LFT Recent Labs    01/22/24 0222  PROT 7.0  ALBUMIN  3.6  AST 25  ALT 26  ALKPHOS 66  BILITOT 0.2   PT/INR Recent Labs    01/23/24 0226 01/24/24 0256  LABPROT 27.6* 28.6*  INR 2.4* 2.5*   Hepatitis Panel No results for input(s): HEPBSAG, HCVAB, HEPAIGM, HEPBIGM in the last 72 hours.  MR ABDOMEN MRCP W WO CONTAST Result Date: 01/23/2024 CLINICAL DATA:  Pancreatitis EXAM: MRI ABDOMEN WITHOUT AND WITH CONTRAST (INCLUDING MRCP) TECHNIQUE: Multiplanar multisequence MR imaging of the abdomen was performed both before and after the administration of intravenous contrast. Heavily T2-weighted images of the biliary and pancreatic ducts were obtained, and three-dimensional MRCP images were rendered by post processing. CONTRAST:  6.5mL GADAVIST  GADOBUTROL  1 MMOL/ML IV SOLN COMPARISON:  CT abdomen pelvis, 01/22/2024 FINDINGS: Lower chest: No acute abnormality. Hepatobiliary: No solid liver abnormality is seen. No gallstones, gallbladder wall thickening, or biliary dilatation. Pancreas: Diffuse inflammatory fat stranding about the pancreas. Severe pancreatic ductal dilatation measuring up to 1.0 cm in  caliber in the pancreatic neck, the pancreatic duct effaced within the central pancreatic head (series 3, image 22). Interval development of an elongated fluid collection superior to the pancreatic body and tail and adjacent to the lesser curvature of the stomach, measuring 6.9 x 2.3 x 3.0 cm (series 3, image 18, series 2, image 22). Unchanged heterogeneous fluid collection ventral  to the pancreatic head, involving the gastric antrum and pylorus measuring 3.0 x 2.1 x 3.0 cm (series 3, image 23, series 2, image 22) Spleen: Normal in size without significant abnormality. Adrenals/Urinary Tract: Adrenal glands are unremarkable. Kidneys are normal, without renal calculi, solid lesion, or hydronephrosis. Stomach/Bowel: Diverticulum of the gastric fundus. Very extensive, severe inflammatory fat stranding and wall thickening of the gastric antrum, pylorus, and proximal duodenal with associated complex fluid collection as described above (series 3, image 25). No evidence of bowel wall thickening, distention, or inflammatory changes. Vascular/Lymphatic: No significant vascular findings are present. No enlarged abdominal lymph nodes. Other: No abdominal wall hernia or abnormality. Small volume ascites. Musculoskeletal: No acute or significant osseous findings. IMPRESSION: 1. Diffuse inflammatory fat stranding about the pancreas, consistent with acute pancreatitis. 2. Severe pancreatic ductal dilatation measuring up to 1.0 cm in caliber in the pancreatic neck, the pancreatic duct effaced within the central pancreatic head. 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 3. Since earlier same day examination, interval development of an elongated acute pancreatic fluid collection superior to the pancreatic body and tail and adjacent to the lesser curvature of the stomach, measuring 6.9 x 2.3 x 3.0 cm. 4. Unchanged heterogeneous fluid collection ventral to the pancreatic head, involving the severely inflamed gastric antrum and pylorus measuring 3.0 x 2.1 x 3.0 cm, consistent with acute pancreatic fluid collection and concerning for ulceration to the bowel lumen given size, complexity, and significant involvement of the bowel wall. 5. Presence or absence of infection within the above described fluid collections is not established by imaging. 6. Small volume ascites. Electronically Signed    By: Marolyn JONETTA Jaksch M.D.   On: 01/23/2024 22:08   MR 3D Recon At Scanner Result Date: 01/23/2024 CLINICAL DATA:  Pancreatitis EXAM: MRI ABDOMEN WITHOUT AND WITH CONTRAST (INCLUDING MRCP) TECHNIQUE: Multiplanar multisequence MR imaging of the abdomen was performed both before and after the administration of intravenous contrast. Heavily T2-weighted images of the biliary and pancreatic ducts were obtained, and three-dimensional MRCP images were rendered by post processing. CONTRAST:  6.5mL GADAVIST  GADOBUTROL  1 MMOL/ML IV SOLN COMPARISON:  CT abdomen pelvis, 01/22/2024 FINDINGS: Lower chest: No acute abnormality. Hepatobiliary: No solid liver abnormality is seen. No gallstones, gallbladder wall thickening, or biliary dilatation. Pancreas: Diffuse inflammatory fat stranding about the pancreas. Severe pancreatic ductal dilatation measuring up to 1.0 cm in caliber in the pancreatic neck, the pancreatic duct effaced within the central pancreatic head (series 3, image 22). Interval development of an elongated fluid collection superior to the pancreatic body and tail and adjacent to the lesser curvature of the stomach, measuring 6.9 x 2.3 x 3.0 cm (series 3, image 18, series 2, image 22). Unchanged heterogeneous fluid collection ventral to the pancreatic head, involving the gastric antrum and pylorus measuring 3.0 x 2.1 x 3.0 cm (series 3, image 23, series 2, image 22) Spleen: Normal in size without significant abnormality. Adrenals/Urinary Tract: Adrenal glands are unremarkable. Kidneys are normal, without renal calculi, solid lesion, or hydronephrosis. Stomach/Bowel: Diverticulum of the gastric fundus. Very extensive, severe inflammatory fat stranding and wall thickening of the gastric antrum, pylorus, and proximal  duodenal with associated complex fluid collection as described above (series 3, image 25). No evidence of bowel wall thickening, distention, or inflammatory changes. Vascular/Lymphatic: No significant vascular  findings are present. No enlarged abdominal lymph nodes. Other: No abdominal wall hernia or abnormality. Small volume ascites. Musculoskeletal: No acute or significant osseous findings. IMPRESSION: 1. Diffuse inflammatory fat stranding about the pancreas, consistent with acute pancreatitis. 2. Severe pancreatic ductal dilatation measuring up to 1.0 cm in caliber in the pancreatic neck, the pancreatic duct effaced within the central pancreatic head. 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 3. Since earlier same day examination, interval development of an elongated acute pancreatic fluid collection superior to the pancreatic body and tail and adjacent to the lesser curvature of the stomach, measuring 6.9 x 2.3 x 3.0 cm. 4. Unchanged heterogeneous fluid collection ventral to the pancreatic head, involving the severely inflamed gastric antrum and pylorus measuring 3.0 x 2.1 x 3.0 cm, consistent with acute pancreatic fluid collection and concerning for ulceration to the bowel lumen given size, complexity, and significant involvement of the bowel wall. 5. Presence or absence of infection within the above described fluid collections is not established by imaging. 6. Small volume ascites. Electronically Signed   By: Marolyn JONETTA Jaksch M.D.   On: 01/23/2024 22:08     Principal Problem:   Chronic recurrent pancreatitis (HCC) Active Problems:   S/P AVR (aortic valve replacement) and aortoplasty   Subtherapeutic international normalized ratio (INR)   Essential hypertension   Pseudocyst of pancreas   Acute on chronic pancreatitis (HCC)   On pre-exposure prophylaxis for HIV   History of asthma   History of alcohol abuse   Protein-calorie malnutrition, severe   Small intestinal bacterial overgrowth (SIBO)   Intraabdominal fluid collection     LOS: 13 days     Anselm Bring, MD 01/24/2024, 2:36 PM [facility] [phone]

## 2024-01-25 DIAGNOSIS — K859 Acute pancreatitis without necrosis or infection, unspecified: Secondary | ICD-10-CM | POA: Diagnosis not present

## 2024-01-25 DIAGNOSIS — K638219 Small intestinal bacterial overgrowth, unspecified: Secondary | ICD-10-CM | POA: Diagnosis not present

## 2024-01-25 DIAGNOSIS — R188 Other ascites: Secondary | ICD-10-CM | POA: Diagnosis not present

## 2024-01-25 DIAGNOSIS — K863 Pseudocyst of pancreas: Secondary | ICD-10-CM | POA: Diagnosis not present

## 2024-01-25 LAB — COMPREHENSIVE METABOLIC PANEL WITH GFR
ALT: 26 U/L (ref 0–44)
AST: 21 U/L (ref 15–41)
Albumin: 3.4 g/dL — ABNORMAL LOW (ref 3.5–5.0)
Alkaline Phosphatase: 68 U/L (ref 38–126)
Anion gap: 10 (ref 5–15)
BUN: 11 mg/dL (ref 6–20)
CO2: 29 mmol/L (ref 22–32)
Calcium: 8.8 mg/dL — ABNORMAL LOW (ref 8.9–10.3)
Chloride: 99 mmol/L (ref 98–111)
Creatinine, Ser: 0.7 mg/dL (ref 0.61–1.24)
GFR, Estimated: >60 mL/min (ref >60)
Glucose, Bld: 104 mg/dL — ABNORMAL HIGH (ref 70–99)
Potassium: 3.9 mmol/L (ref 3.5–5.1)
Sodium: 138 mmol/L (ref 135–145)
Total Bilirubin: 0.4 mg/dL (ref 0.0–1.2)
Total Protein: 6.7 g/dL (ref 6.5–8.1)

## 2024-01-25 LAB — CBC WITH DIFFERENTIAL/PLATELET
Abs Immature Granulocytes: 0.02 K/uL (ref 0.00–0.07)
Basophils Absolute: 0 K/uL (ref 0.0–0.1)
Basophils Relative: 0 %
Eosinophils Absolute: 0.4 K/uL (ref 0.0–0.5)
Eosinophils Relative: 7 %
HCT: 31.3 % — ABNORMAL LOW (ref 39.0–52.0)
Hemoglobin: 10.5 g/dL — ABNORMAL LOW (ref 13.0–17.0)
Immature Granulocytes: 0 %
Lymphocytes Relative: 27 %
Lymphs Abs: 1.7 K/uL (ref 0.7–4.0)
MCH: 29 pg (ref 26.0–34.0)
MCHC: 33.5 g/dL (ref 30.0–36.0)
MCV: 86.5 fL (ref 80.0–100.0)
Monocytes Absolute: 0.8 K/uL (ref 0.1–1.0)
Monocytes Relative: 13 %
Neutro Abs: 3.3 K/uL (ref 1.7–7.7)
Neutrophils Relative %: 53 %
Platelets: 264 K/uL (ref 150–400)
RBC: 3.62 MIL/uL — ABNORMAL LOW (ref 4.22–5.81)
RDW: 13.8 % (ref 11.5–15.5)
WBC: 6.2 K/uL (ref 4.0–10.5)
nRBC: 0 % (ref 0.0–0.2)

## 2024-01-25 LAB — GLUCOSE, CAPILLARY
Glucose-Capillary: 98 mg/dL (ref 70–99)
Glucose-Capillary: 92 mg/dL (ref 70–99)
Glucose-Capillary: 127 mg/dL — ABNORMAL HIGH (ref 70–99)

## 2024-01-25 LAB — LIPASE, BLOOD: Lipase: 142 U/L — ABNORMAL HIGH (ref 11–51)

## 2024-01-25 LAB — PROTIME-INR
INR: 1.6 — ABNORMAL HIGH (ref 0.8–1.2)
Prothrombin Time: 20.1 s — ABNORMAL HIGH (ref 11.4–15.2)

## 2024-01-25 MED ORDER — WARFARIN SODIUM 4 MG PO TABS
4.5000 mg | ORAL_TABLET | Freq: Once | ORAL | Status: AC
  Administered 2024-01-25: 4.5 mg via ORAL
  Filled 2024-01-25: qty 1

## 2024-01-25 MED ORDER — BOOST / RESOURCE BREEZE PO LIQD CUSTOM
1.0000 | Freq: Three times a day (TID) | ORAL | Status: DC
  Administered 2024-01-25 – 2024-01-26 (×4): 1 via ORAL

## 2024-01-25 NOTE — Progress Notes (Signed)
 PHARMACY - ANTICOAGULATION CONSULT NOTE  Pharmacy Consult for Warfarin Indication: On-X mechanical AVR  Allergies  Allergen Reactions   Amoxicillin Hives    Patient Measurements: Height: 5' 11 (180.3 cm) Weight: 66 kg (145 lb 9.6 oz) IBW/kg (Calculated) : 75.3 HEPARIN  DW (KG): 71  Vital Signs: Temp: 97.4 F (36.3 C) (08/16 0828) Temp Source: Oral (08/16 0828) BP: 127/85 (08/16 0828) Pulse Rate: 67 (08/16 0828)  Labs: Recent Labs    01/23/24 0226 01/24/24 0256 01/25/24 0418  HGB  --   --  10.5*  HCT  --   --  31.3*  PLT  --   --  264  LABPROT 27.6* 28.6* 20.1*  INR 2.4* 2.5* 1.6*  CREATININE  --   --  0.70    Estimated Creatinine Clearance: 122.6 mL/min (by C-G formula based on SCr of 0.7 mg/dL).  Assessment: 33 yo M presenting with pancreatitis. Warfarin PTA regimen is 6 mg Monday and Thursday and 4.5 mg all other days (total weekly dose: 34.5 mg).   INR is now in target range (1.6) after lower warfarin doses (1 mg and 2.5 mg) for 2 days when INR > 2.  CT abd with increased fluid collection at head of pancrease concerning for peripancreatic abscess.  MRCP completed 8/14 and tube feeds restarted. Anticipated INR trend down given lower warfarin doses and resumption of tube feeds.  May still trend down further.   Of note, patient started rifaximin  8/9 which may decrease INR.   Goal of Therapy:  INR 1.5-2.0 Monitor platelets by anticoagulation protocol: Yes   Plan:  Warfarin 4.5 mg x 1 today, usual Saturday dose. Daily PT/INR.  Genaro Zebedee Calin, RPh 01/25/2024,10:15 AM

## 2024-01-25 NOTE — Progress Notes (Signed)
 Patient had several episodes of gas and burping. Patient then vomited up his coretrak. Coretrak was fully removed. Dr. ONEIDA Sprinkles was made aware. Provider states he will make coretrak team aware in the morning. Patient is resting in bed and not in acute distress. Will continue to follow plan of care.

## 2024-01-25 NOTE — Progress Notes (Cosign Needed Addendum)
 Patient ID: [name] [name], male   DOB: 1991/02/28, 33 y.o.   MRN: [id number]    Progress Note   Subjective   Day # 14 CC; acute on chronic pancreatitis with development of new pseudocyst  Labs today pro time 20.1/INR 1.6 Lipase 142 improved WBC 6.2/hemoglobin 10.5/hematocrit 31.3 Potassium 3.9/BUN 11/creatinine 0.7 LFTs normal  MRI/MRCP 01/23/2024-diffuse inflammatory fat stranding around the pancreas consistent with acute pancreatitis, severe pancreatic ductal dilation measuring up to 1.0 cm in pancreatic neck and findings consistent with chronic stigmata of pancreatitis, since prior exam there has been interval development of an elongated acute pancreatic fluid collection superior to the pancreatic body and tail and adjacent to the lesser curvature of the stomach measuring 6.9 x 2.3 x 3.0 cm, and unchanged fluid collection ventral to the pancreatic head involving the severely inflamed gastric antrum and pylorus measuring 3.0 x 2.1 x 3.0 cm concerning for ulceration to the bowel lumen given size complexity and involvement of the bowel wall  Patient vomited last evening and cortrack became dislodged-leaving out for now.  He is not having any significant abdominal pain over his baseline, says he felt better after he vomited, did not vomit up the huge amount of material.  This a.m. has not felt like eating and has had nausea    Objective   Vital signs in last 24 hours: Temp:  [97.3 F (36.3 C)-98.2 F (36.8 C)] 97.4 F (36.3 C) (08/16 0828) Pulse Rate:  [65-77] 67 (08/16 0828) Resp:  [16-18] 16 (08/16 0404) BP: (113-127)/(73-86) 127/85 (08/16 0828) SpO2:  [97 %-100 %] 99 % (08/16 0828) Last BM Date : 01/24/24 (per pt) General:    Young white male in NAD Heart:  Regular rate and rhythm; no murmurs Lungs: Respirations even and unlabored, lungs CTA bilaterally Abdomen:  Soft, minimally tender upper abdomen nondistended. Normal bowel sounds. Extremities:  Without  edema. Neurologic:  Alert and oriented,  grossly normal neurologically. Psych:  Cooperative. Normal mood and affect.  Intake/Output from previous day: 08/15 0701 - 08/16 0700 In: 860 [P.O.:360; NG/GT:500] Out: -  Intake/Output this shift: No intake/output data recorded.  Lab Results: Recent Labs    01/25/24 0418  WBC 6.2  HGB 10.5*  HCT 31.3*  PLT 264   BMET Recent Labs    01/25/24 0418  NA 138  K 3.9  CL 99  CO2 29  GLUCOSE 104*  BUN 11  CREATININE 0.70  CALCIUM  8.8*   LFT Recent Labs    01/25/24 0418  PROT 6.7  ALBUMIN  3.4*  AST 21  ALT 26  ALKPHOS 68  BILITOT 0.4   PT/INR Recent Labs    01/24/24 0256 01/25/24 0418  LABPROT 28.6* 20.1*  INR 2.5* 1.6*    Studies/Results: MR ABDOMEN MRCP W WO CONTAST Result Date: 01/23/2024 CLINICAL DATA:  Pancreatitis EXAM: MRI ABDOMEN WITHOUT AND WITH CONTRAST (INCLUDING MRCP) TECHNIQUE: Multiplanar multisequence MR imaging of the abdomen was performed both before and after the administration of intravenous contrast. Heavily T2-weighted images of the biliary and pancreatic ducts were obtained, and three-dimensional MRCP images were rendered by post processing. CONTRAST:  6.5mL GADAVIST  GADOBUTROL  1 MMOL/ML IV SOLN COMPARISON:  CT abdomen pelvis, 01/22/2024 FINDINGS: Lower chest: No acute abnormality. Hepatobiliary: No solid liver abnormality is seen. No gallstones, gallbladder wall thickening, or biliary dilatation. Pancreas: Diffuse inflammatory fat stranding about the pancreas. Severe pancreatic ductal dilatation measuring up to 1.0 cm in caliber in the pancreatic neck, the pancreatic duct effaced within the central  pancreatic head (series 3, image 22). Interval development of an elongated fluid collection superior to the pancreatic body and tail and adjacent to the lesser curvature of the stomach, measuring 6.9 x 2.3 x 3.0 cm (series 3, image 18, series 2, image 22). Unchanged heterogeneous fluid collection ventral to the  pancreatic head, involving the gastric antrum and pylorus measuring 3.0 x 2.1 x 3.0 cm (series 3, image 23, series 2, image 22) Spleen: Normal in size without significant abnormality. Adrenals/Urinary Tract: Adrenal glands are unremarkable. Kidneys are normal, without renal calculi, solid lesion, or hydronephrosis. Stomach/Bowel: Diverticulum of the gastric fundus. Very extensive, severe inflammatory fat stranding and wall thickening of the gastric antrum, pylorus, and proximal duodenal with associated complex fluid collection as described above (series 3, image 25). No evidence of bowel wall thickening, distention, or inflammatory changes. Vascular/Lymphatic: No significant vascular findings are present. No enlarged abdominal lymph nodes. Other: No abdominal wall hernia or abnormality. Small volume ascites. Musculoskeletal: No acute or significant osseous findings. IMPRESSION: 1. Diffuse inflammatory fat stranding about the pancreas, consistent with acute pancreatitis. 2. Severe pancreatic ductal dilatation measuring up to 1.0 cm in caliber in the pancreatic neck, the pancreatic duct effaced within the central pancreatic head. 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 3. Since earlier same day examination, interval development of an elongated acute pancreatic fluid collection superior to the pancreatic body and tail and adjacent to the lesser curvature of the stomach, measuring 6.9 x 2.3 x 3.0 cm. 4. Unchanged heterogeneous fluid collection ventral to the pancreatic head, involving the severely inflamed gastric antrum and pylorus measuring 3.0 x 2.1 x 3.0 cm, consistent with acute pancreatic fluid collection and concerning for ulceration to the bowel lumen given size, complexity, and significant involvement of the bowel wall. 5. Presence or absence of infection within the above described fluid collections is not established by imaging. 6. Small volume ascites. Electronically Signed   By: Marolyn JONETTA Jaksch M.D.   On: 01/23/2024 22:08   MR 3D Recon At Scanner Result Date: 01/23/2024 CLINICAL DATA:  Pancreatitis EXAM: MRI ABDOMEN WITHOUT AND WITH CONTRAST (INCLUDING MRCP) TECHNIQUE: Multiplanar multisequence MR imaging of the abdomen was performed both before and after the administration of intravenous contrast. Heavily T2-weighted images of the biliary and pancreatic ducts were obtained, and three-dimensional MRCP images were rendered by post processing. CONTRAST:  6.5mL GADAVIST  GADOBUTROL  1 MMOL/ML IV SOLN COMPARISON:  CT abdomen pelvis, 01/22/2024 FINDINGS: Lower chest: No acute abnormality. Hepatobiliary: No solid liver abnormality is seen. No gallstones, gallbladder wall thickening, or biliary dilatation. Pancreas: Diffuse inflammatory fat stranding about the pancreas. Severe pancreatic ductal dilatation measuring up to 1.0 cm in caliber in the pancreatic neck, the pancreatic duct effaced within the central pancreatic head (series 3, image 22). Interval development of an elongated fluid collection superior to the pancreatic body and tail and adjacent to the lesser curvature of the stomach, measuring 6.9 x 2.3 x 3.0 cm (series 3, image 18, series 2, image 22). Unchanged heterogeneous fluid collection ventral to the pancreatic head, involving the gastric antrum and pylorus measuring 3.0 x 2.1 x 3.0 cm (series 3, image 23, series 2, image 22) Spleen: Normal in size without significant abnormality. Adrenals/Urinary Tract: Adrenal glands are unremarkable. Kidneys are normal, without renal calculi, solid lesion, or hydronephrosis. Stomach/Bowel: Diverticulum of the gastric fundus. Very extensive, severe inflammatory fat stranding and wall thickening of the gastric antrum, pylorus, and proximal duodenal with associated complex fluid collection as described above (series 3, image  25). No evidence of bowel wall thickening, distention, or inflammatory changes. Vascular/Lymphatic: No significant vascular findings  are present. No enlarged abdominal lymph nodes. Other: No abdominal wall hernia or abnormality. Small volume ascites. Musculoskeletal: No acute or significant osseous findings. IMPRESSION: 1. Diffuse inflammatory fat stranding about the pancreas, consistent with acute pancreatitis. 2. Severe pancreatic ductal dilatation measuring up to 1.0 cm in caliber in the pancreatic neck, the pancreatic duct effaced within the central pancreatic head. 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 3. Since earlier same day examination, interval development of an elongated acute pancreatic fluid collection superior to the pancreatic body and tail and adjacent to the lesser curvature of the stomach, measuring 6.9 x 2.3 x 3.0 cm. 4. Unchanged heterogeneous fluid collection ventral to the pancreatic head, involving the severely inflamed gastric antrum and pylorus measuring 3.0 x 2.1 x 3.0 cm, consistent with acute pancreatic fluid collection and concerning for ulceration to the bowel lumen given size, complexity, and significant involvement of the bowel wall. 5. Presence or absence of infection within the above described fluid collections is not established by imaging. 6. Small volume ascites. Electronically Signed   By: Marolyn JONETTA Jaksch M.D.   On: 01/23/2024 22:08       Assessment / Plan:    #17 33 year old male with acute on chronic calcific pancreatitis with marked pancreatic duct dilation.  Patient had improvement late last weekend and last weekend able to tolerate solid diet, then developed nausea and vomiting earlier this week  Imaging with MRI/MRCP now shows persistent active pancreatitis with diffuse inflammatory stranding including extensive inflammatory fat stranding and wall thickening gastric antrum pylorus and proximal duodenum associated with the complex fluid collection which had been present previously. In addition he has a new elongated fluid collection inferior to the pancreatic body and  tail and adjacent to the lesser curve measuring 6.9 x 2.3 x 3 cm.  Unfortunately core track was vomited up last evening  Will leave the tube out for now and try clear liquids with resource supplements between meals  If he cannot tolerate p.o.'s then we will replace core track  #2 chronic Coumadin  status post mechanical aortic valve replacement  #3 mild anemia stable #4 outpatient SIBO test positive, completing course of Xifaxan   Plan; clear liquid with resource berry supplements encourage patient to take small amounts throughout the day Octreotide  started yesterday, doubt but cannot rule out pancreatic duct disruption  Will plan for pancreatic protocol CT on Monday, and depending on the maturity of the walls of the more chronic bilobed pseudocyst then Dr. Wilhelmenia  will decide if endoscopic drainage is feasible.  Will discuss holding Coumadin , and bridging with heparin  -INR today 1.6 so no need to hold as yet  GI will continue to follow       Principal Problem:   Chronic recurrent pancreatitis (HCC) Active Problems:   S/P AVR (aortic valve replacement) and aortoplasty   Subtherapeutic international normalized ratio (INR)   Essential hypertension   Pseudocyst of pancreas   Acute on chronic pancreatitis (HCC)   On pre-exposure prophylaxis for HIV   History of asthma   History of alcohol abuse   Protein-calorie malnutrition, severe   Small intestinal bacterial overgrowth (SIBO)   Intraabdominal fluid collection     LOS: 14 days   Chang Tiggs [name] 01/25/2024, 10:44 AM

## 2024-01-25 NOTE — Plan of Care (Signed)

## 2024-01-25 NOTE — Progress Notes (Signed)
 Progress Note   Patient: [name] [name] FMW:[id number] DOB: [birth year] DOA: 01/11/2024     14 DOS: the patient was seen and examined on 01/25/2024   Brief hospital course: 33 y.o. male with medical history significant of hypertension, bicuspid aortic valve with severe aortic insufficiency and moderate aortic stenosis s/p mechanical AV replacement on 09/27/2021 on chronic anticoagulation, recurrent pancreatitis, asthma, GERD, and prior history of alcohol abuse presents with abdominal pain found to have AKI pancreatitis with pseudocyst, GI was consulted and admitted for conservative management. Patient had multiple hospitalization for pancreatitis. Patient with decreased tolerance to diet and w/ severe malnutrition> cortrak placed post pyloric on 01/15/24, diet orally was slowly advanced  8/13.  Patient with dilation of the pancreatic duct with calcifications consistent with chronic pancreatitis, increase in the multi loculated fluid collection, decreased ascites 8/14.  Case discussed with gastroenterology.  MRCP of the abdomen ordered.  Okay to start tube feeding after MRCP done. 8/15.  Lipase up to 451.  MRCP showing diffuse inflammatory fat stranding about the pancreas consistent with acute pancreatitis.  Severe ductal dilation consistent with chronic stigmata of pancreatitis.  Interval development of a fluid collection 6.9 x 2.3 x 3.0 cm and a prior fluid collection 3.0 x 2.1 x 3.0 cm.  Started on octreotide  by gastroenterology 8/16.  Cortrak tube came out last night.  Case discussed with gastroenterology and will watch without tube at this point.  Keep on full liquid diet.  Lipase down to 142.   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  Continued abdominal pain.  CT scan showed very dilated pancreatic duct.  MRCP showing 2 fluid collections and a dilated pancreatic duct and findings consistent with acute pancreatitis.  Octreotide  started by  gastroenterology.  Cortrak tube came out on 8/15 with an episode of vomiting.  Spoke with gastroenterology and they will hold off on replacing tube at this point.  Continue to follow liquid diet.  Pseudocyst of pancreas Case discussed with gastroenterology since he is not having a fever, he does not believe that this is an abscess.  Continue to hold off on antibiotics.  Small intestinal bacterial overgrowth (SIBO) On rifaximin  for 14 days  S/P AVR (aortic valve replacement) and aortoplasty Continue Coumadin   Essential hypertension On Toprol   History of alcohol abuse Remission for 10 months  Protein-calorie malnutrition, severe Cortrak tube came out last night.  As per GI okay to hold off at this point.  Patient on full liquid diet.        Subjective: Patient has intermittent abdominal pain.  Had a bowel movement yesterday.  Vomited last night and the core track tube came out.  Physical Exam: Vitals:   01/24/24 1934 01/24/24 2358 01/25/24 0404 01/25/24 0828  BP: 126/85 116/86 121/73 127/85  Pulse: 74 77 65 67  Resp: 16 16 16    Temp: 98.2 F (36.8 C) (!) 97.3 F (36.3 C) 98.1 F (36.7 C) (!) 97.4 F (36.3 C)  TempSrc: Oral Oral Oral Oral  SpO2: 97% 98% 98% 99%  Weight:      Height:       Physical Exam HENT:     Head: Normocephalic.     Mouth/Throat:     Pharynx: No oropharyngeal exudate.  Eyes:     General: Lids are normal.     Conjunctiva/sclera: Conjunctivae normal.  Cardiovascular:     Rate and Rhythm: Normal rate and regular rhythm.     Heart sounds: Normal heart  sounds, S1 normal and S2 normal.  Pulmonary:     Breath sounds: No decreased breath sounds, wheezing, rhonchi or rales.  Abdominal:     Palpations: Abdomen is soft.     Tenderness: There is abdominal tenderness in the epigastric area.  Musculoskeletal:     Right lower leg: No swelling.     Left lower leg: No swelling.  Skin:    General: Skin is warm.     Findings: No rash.  Neurological:      Mental Status: He is alert and oriented to person, place, and time.     Data Reviewed: Lipase 142, creatinine 0.7, hemoglobin 10.5, INR 1.6  Disposition: Status is: Inpatient Remains inpatient appropriate because: Gastroenterology will probably repeat a CAT scan next week.  Planned Discharge Destination: Home    Time spent: 28 minutes Case discussed with gastroenterology  Author: Charlie Patterson, MD 01/25/2024 11:45 AM  For on call review [email or web address].

## 2024-01-26 DIAGNOSIS — E46 Unspecified protein-calorie malnutrition: Secondary | ICD-10-CM

## 2024-01-26 DIAGNOSIS — K859 Acute pancreatitis without necrosis or infection, unspecified: Secondary | ICD-10-CM | POA: Diagnosis not present

## 2024-01-26 DIAGNOSIS — K8681 Exocrine pancreatic insufficiency: Secondary | ICD-10-CM | POA: Diagnosis not present

## 2024-01-26 DIAGNOSIS — Z952 Presence of prosthetic heart valve: Secondary | ICD-10-CM | POA: Diagnosis not present

## 2024-01-26 DIAGNOSIS — K863 Pseudocyst of pancreas: Secondary | ICD-10-CM | POA: Diagnosis not present

## 2024-01-26 DIAGNOSIS — K638219 Small intestinal bacterial overgrowth, unspecified: Secondary | ICD-10-CM | POA: Diagnosis not present

## 2024-01-26 DIAGNOSIS — K861 Other chronic pancreatitis: Secondary | ICD-10-CM | POA: Diagnosis not present

## 2024-01-26 LAB — GLUCOSE, CAPILLARY
Glucose-Capillary: 94 mg/dL (ref 70–99)
Glucose-Capillary: 117 mg/dL — ABNORMAL HIGH (ref 70–99)
Glucose-Capillary: 103 mg/dL — ABNORMAL HIGH (ref 70–99)

## 2024-01-26 LAB — PROTIME-INR
INR: 2 — ABNORMAL HIGH (ref 0.8–1.2)
Prothrombin Time: 23.4 s — ABNORMAL HIGH (ref 11.4–15.2)

## 2024-01-26 MED ORDER — LACTATED RINGERS IV SOLN: INTRAVENOUS | Status: DC

## 2024-01-26 NOTE — Plan of Care (Signed)

## 2024-01-26 NOTE — Consult Note (Signed)
 [name] [name] [birth year]  [id number].    Requesting MD: Lynnie Bring, MD Chief Complaint/Reason for Consult: chronic pancreatitis  HPI:  [name] [name] is a 33 yo male with a history of recurrent acute pancreatitis with prior EtOH use, starting in 2020. He has had multiple episodes of pancreatitis since then, now with signs of chronic pancreatitis. He had an EUS by Dr. Wilhelmenia in February of this year. This showed a cystic lesion in the head of the pancreas consistent with a pseudocyst, and a 5mm stone within the PD at the junction of the head/neck. Cytology the cyst was benign. He has continued to have pain and has had several hospitalizations this year. He was scheduled for an ERCP with attempted stone extraction in September with Dr. Wilhelmenia. He is now admitted with acute abdominal pain. His new imaging shows changes of chronic pancreatitis with calcifications in the head of the pancreas, as well as gland atrophy and main PD dilation. There is a persistent pseudocyst in the neck of the pancreas, as well as a fluid collection adjacent to the tail of the pancreas. He underwent a CT pancreas this morning, which shows some gastric pneumatosis. This morning the patient reports continued epigastric pain, although it is not as severe as when he was admitted. He also reports worsening nausea and belching in the last 1-2 days. He says the nausea seems to get worse after he gets octreotide .  He says overall his symptoms have been much worse since October 2024. He has lost about 50 pounds unintentionally since then, and his appetite and PO intake have been poor. He had an NJ feeding tube but this became dislodged and has not been replaced. His only prior abdominal surgery is a bilateral inguinal hernia repair. He has had an aortic valve replacement and is on Coumadin . He does not smoke and says he stopped drinking alcohol almost a year ago.  ROS: Review of Systems  Constitutional:  Negative  for chills and fever.  Gastrointestinal:  Positive for abdominal pain and nausea.    Family History  Problem Relation Age of Onset   Pancreatitis Maternal Uncle    Colon cancer Neg Hx    Esophageal cancer Neg Hx     Past Medical History:  Diagnosis Date   Asthma    Headache    migraines   Heart murmur    Hypokalemia 06/08/2020   Leukocytosis 01/23/2022   Mild aortic stenosis    Pancreatitis    Pneumonia    as a child x several   Substance abuse (HCC)     Past Surgical History:  Procedure Laterality Date   AORTIC VALVE REPLACEMENT N/A 09/27/2021   Procedure: AORTIC VALVE REPLACEMENT (AVR) USING ON-X AORTIC VALVE SIZE ;  Surgeon: Kerrin Elspeth BROCKS, MD;  Location: [facility] OR;  Service: Open Heart Surgery;  Laterality: N/A;   BIOPSY  08/01/2023   Procedure: BIOPSY;  Surgeon: Wilhelmenia Aloha Raddle., MD;  Location: [facility];  Service: Gastroenterology;;   ESOPHAGOGASTRODUODENOSCOPY N/A 08/01/2023   Procedure: ESOPHAGOGASTRODUODENOSCOPY (EGD);  Surgeon: Wilhelmenia Aloha Raddle., MD;  Location: [facility];  Service: Gastroenterology;  Laterality: N/A;   EUS N/A 08/01/2023   Procedure: UPPER ENDOSCOPIC ULTRASOUND (EUS) RADIAL;  Surgeon: Wilhelmenia Aloha Raddle., MD;  Location: [facility];  Service: Gastroenterology;  Laterality: N/A;   FINE NEEDLE ASPIRATION N/A 08/01/2023   Procedure: FINE NEEDLE ASPIRATION (FNA) LINEAR;  Surgeon: Wilhelmenia Aloha Raddle., MD;  Location: [facility];  Service: Gastroenterology;  Laterality: N/A;  INGUINAL HERNIA REPAIR Right 01/05/2022   Procedure: OPEN REPAIR INCARCERATED RIGHT INGUINAL HERNIA WITH MESH;  Surgeon: Tanda Locus, MD;  Location: [facility];  Service: General;  Laterality: Right;  90 MIN - ROOM 2   INGUINAL HERNIA REPAIR Left 12/20/2022   Procedure: OPEN REPAIR LEFT INGUINAL HERNIA WITH MESH;  Surgeon: Tanda Locus, MD;  Location: [facility];  Service: General;  Laterality: Left;   REPLACEMENT ASCENDING AORTA N/A 09/27/2021   Procedure:  REPLACEMENT ASCENDING AND INNOMINATE ANEURYSM USING HEMASHIELD PLATINUM [id number];  Surgeon: Kerrin Elspeth BROCKS, MD;  Location: [facility] OR;  Service: Open Heart Surgery;  Laterality: N/A;   RIGHT HEART CATH AND CORONARY ANGIOGRAPHY N/A 09/07/2021   Procedure: RIGHT HEART CATH AND CORONARY ANGIOGRAPHY;  Surgeon: Wendel Lurena POUR, MD;  Location: [facility];  Service: Cardiovascular;  Laterality: N/A;   TEE WITHOUT CARDIOVERSION N/A 09/27/2021   Procedure: TRANSESOPHAGEAL ECHOCARDIOGRAM (TEE);  Surgeon: Kerrin Elspeth BROCKS, MD;  Location: [facility] OR;  Service: Open Heart Surgery;  Laterality: N/A;    Social History:  reports that he has never smoked. He has never used smokeless tobacco. He reports that he does not currently use alcohol. He reports that he does not currently use drugs after having used the following drugs: Other-see comments.  Allergies:  Allergies  Allergen Reactions   Amoxicillin Hives    Medications Prior to Admission  Medication Sig Dispense Refill   acetaminophen  (TYLENOL ) 500 MG tablet Take 1,000 mg by mouth every 6 (six) hours as needed for moderate pain (pain score 4-6).     aspirin  EC 81 MG EC tablet Take 1 tablet (81 mg total) by mouth daily. Swallow whole. 30 tablet 11   dicyclomine  (BENTYL ) 10 MG capsule TAKE 2 CAPSULES(20 MG) BY MOUTH FOUR TIMES DAILY BEFORE MEALS AND AT BEDTIME (Patient taking differently: Take 20 mg by mouth 4 (four) times daily -  before meals and at bedtime.) 360 capsule 1   lipase/protease/amylase (CREON ) 36000 UNITS CPEP capsule Take 2 capsules (72,000 Units total) by mouth 3 (three) times daily before meals.     loratadine  (CLARITIN ) 10 MG tablet Take 10 mg by mouth daily.     methocarbamol  (ROBAXIN ) 500 MG tablet Take 1-2 tablets (500-1,000 mg total) by mouth 4 (four) times daily. (Patient taking differently: Take 500 mg by mouth daily as needed for muscle spasms.) 30 tablet 0   metoprolol  succinate (TOPROL  XL) 25 MG 24 hr tablet Take  0.5 tablets (12.5 mg total) by mouth at bedtime. 45 tablet 2   warfarin (COUMADIN ) 3 MG tablet TAKE 1 1/2 TABLETS TO 2 TABLETS BY MOUTH DAILY OR AS DIRECTED BY COAGULATION CLINIC (Patient taking differently: Take 4.5-6 mg by mouth See admin instructions. Take 6mg  (2 tablets) by mouth every Monday and Wednesday, and take 4.5mg  (1 and 1/2 tablet) every other day of the week.) 140 tablet 0   emtricitabine -tenofovir  (TRUVADA) 200-300 MG tablet Take 1 tablet by mouth daily. (Patient not taking: Reported on 01/11/2024)       Physical Exam: Blood pressure 132/82, pulse 76, temperature 98 F (36.7 C), temperature source Oral, resp. rate 16, height 5' 11 (1.803 m), weight 66 kg, SpO2 97%. General: resting comfortably, appears stated age, no apparent distress Neurological: alert and oriented, no focal deficits, cranial nerves grossly in tact HEENT: normocephalic, atraumatic Respiratory: normal work of breathing on room air. Median sternotomy scar. Abdomen: soft, nondistended, mild epigastric tenderness to palpation.  Extremities: warm and well-perfused, no deformities, moving all extremities spontaneously  Psychiatric: normal mood and affect Skin: warm and dry, no jaundice, no rashes or lesions   Results for orders placed or performed during the hospital encounter of 01/11/24 (from the past 48 hours)  Protime-INR     Status: Abnormal   Collection Time: 01/24/24  2:56 AM  Result Value Ref Range   Prothrombin Time 28.6 (H) 11.4 - 15.2 seconds   INR 2.5 (H) 0.8 - 1.2    Comment: (NOTE) INR goal varies based on device and disease states. Performed at [facility], [address], [place], [place] [address]   Lipase, blood     Status: Abnormal   Collection Time: 01/24/24  2:56 AM  Result Value Ref Range   Lipase 451 (H) 11 - 51 U/L    Comment: RESULTS CONFIRMED BY MANUAL DILUTION Performed at [facility], [address] [place], [place] [address]   Glucose, capillary     Status:  Abnormal   Collection Time: 01/24/24  4:11 AM  Result Value Ref Range   Glucose-Capillary 114 (H) 70 - 99 mg/dL    Comment: Glucose reference range applies only to samples taken after fasting for at least 8 hours.  Glucose, capillary     Status: Abnormal   Collection Time: 01/24/24 11:28 AM  Result Value Ref Range   Glucose-Capillary 109 (H) 70 - 99 mg/dL    Comment: Glucose reference range applies only to samples taken after fasting for at least 8 hours.  Glucose, capillary     Status: Abnormal   Collection Time: 01/24/24  3:57 PM  Result Value Ref Range   Glucose-Capillary 123 (H) 70 - 99 mg/dL    Comment: Glucose reference range applies only to samples taken after fasting for at least 8 hours.  Protime-INR     Status: Abnormal   Collection Time: 01/25/24  4:18 AM  Result Value Ref Range   Prothrombin Time 20.1 (H) 11.4 - 15.2 seconds   INR 1.6 (H) 0.8 - 1.2    Comment: (NOTE) INR goal varies based on device and disease states. Performed at [facility], [address] [place], [place] [address]   Comprehensive metabolic panel     Status: Abnormal   Collection Time: 01/25/24  4:18 AM  Result Value Ref Range   Sodium 138 135 - 145 mmol/L   Potassium 3.9 3.5 - 5.1 mmol/L   Chloride 99 98 - 111 mmol/L   CO2 29 22 - 32 mmol/L   Glucose, Bld 104 (H) 70 - 99 mg/dL    Comment: Glucose reference range applies only to samples taken after fasting for at least 8 hours.   BUN 11 6 - 20 mg/dL   Creatinine, Ser 9.29 0.61 - 1.24 mg/dL   Calcium  8.8 (L) 8.9 - 10.3 mg/dL   Total Protein 6.7 6.5 - 8.1 g/dL   Albumin  3.4 (L) 3.5 - 5.0 g/dL   AST 21 15 - 41 U/L   ALT 26 0 - 44 U/L   Alkaline Phosphatase 68 38 - 126 U/L   Total Bilirubin 0.4 0.0 - 1.2 mg/dL   GFR, Estimated >39 >39 mL/min    Comment: (NOTE) Calculated using the CKD-EPI Creatinine Equation (2021)    Anion gap 10 5 - 15    Comment: Performed at [facility], [address] [place], [place] [address]  CBC  with Differential/Platelet     Status: Abnormal   Collection Time: 01/25/24  4:18 AM  Result  Value Ref Range   WBC 6.2 4.0 - 10.5 K/uL   RBC 3.62 (L) 4.22 - 5.81 MIL/uL   Hemoglobin 10.5 (L) 13.0 - 17.0 g/dL   HCT 68.6 (L) 60.9 - 47.9 %   MCV 86.5 80.0 - 100.0 fL   MCH 29.0 26.0 - 34.0 pg   MCHC 33.5 30.0 - 36.0 g/dL   RDW 86.1 88.4 - 84.4 %   Platelets 264 150 - 400 K/uL   nRBC 0.0 0.0 - 0.2 %   Neutrophils Relative % 53 %   Neutro Abs 3.3 1.7 - 7.7 K/uL   Lymphocytes Relative 27 %   Lymphs Abs 1.7 0.7 - 4.0 K/uL   Monocytes Relative 13 %   Monocytes Absolute 0.8 0.1 - 1.0 K/uL   Eosinophils Relative 7 %   Eosinophils Absolute 0.4 0.0 - 0.5 K/uL   Basophils Relative 0 %   Basophils Absolute 0.0 0.0 - 0.1 K/uL   Immature Granulocytes 0 %   Abs Immature Granulocytes 0.02 0.00 - 0.07 K/uL    Comment: Performed at [facility], [address] [place], [place] [address]  Lipase, blood     Status: Abnormal   Collection Time: 01/25/24  4:18 AM  Result Value Ref Range   Lipase 142 (H) 11 - 51 U/L    Comment: Performed at [facility], [address] [place], [place] [address]  Glucose, capillary     Status: None   Collection Time: 01/25/24  8:29 AM  Result Value Ref Range   Glucose-Capillary 98 70 - 99 mg/dL    Comment: Glucose reference range applies only to samples taken after fasting for at least 8 hours.  Glucose, capillary     Status: None   Collection Time: 01/25/24 12:14 PM  Result Value Ref Range   Glucose-Capillary 92 70 - 99 mg/dL    Comment: Glucose reference range applies only to samples taken after fasting for at least 8 hours.  Glucose, capillary     Status: Abnormal   Collection Time: 01/25/24  4:45 PM  Result Value Ref Range   Glucose-Capillary 127 (H) 70 - 99 mg/dL    Comment: Glucose reference range applies only to samples taken after fasting for at least 8 hours.   No results found.    Assessment/Plan 33 yo male with acute on chronic  pancreatitis. I personally reviewed his labs, imaging, notes, and procedure reports. He now has calcifications in the head of the pancreas, with diffuse pancreatic duct dilation. He previously had a stone in the main duct on EUS, which is no longer visible on MRI. I do not appreciate a discrete stricture in the main duct on current imaging. He has stenosis of the PV/SMV. He does have stranding around the head of the pancreas and antrum associated with the collection in the head of the pancreas - it appears this could be a pseudocyst fistulizing with the antrum/duodenum.   I discussed that I would not recommend any acute surgical interventions this admission, and it is not clear that he would benefit from a surgical pancreatic procedure to relieve his pain. Before even considering any surgeries, his nutrition needs to be optimized. Recommend replacing his NJ feeding tube, vs considering a J tube. Would also strongly consider TPN in the interval until he is reliably tolerating enteral feeds. Will discuss with Dr. Wilhelmenia, but it would be helpful at some point to have an ERCP to clearly delineate the duct anatomy prior to  determining whether or not surgery would be of benefit.  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 I have stopped his octreotide  as this seemed to worsen his nausea and is likely exacerbating any underlying dysmotility that can worsen gastric distension and pneumatosis.   The patient is understandably frustrated at his prolonged hospital course and feels he is not making improvements. I reviewed the often protracted nature of chronic pancreatitis, and the need to prioritize nutritional and medical optimization prior to proceeding with invasive pancreatic surgeries, which are by nature high risk.  Leonor Dawn, MD [facility] General, Hepatobiliary and Pancreatic Surgery 01/26/24 1:05 AM

## 2024-01-26 NOTE — Progress Notes (Addendum)
 Patient ID: [name] [name], male   DOB: [birth year], 33 y.o.   MRN: [id number]    Progress Note   Subjective  Day # 15 CC; acute on chronic pancreatitis with development of new pseudocyst  Chronic Coumadin /status post AVR  Labs today-INR 2.0/pro time 23.4 CA 19-9=8 Remainder of labs pending  Not doing well today, was able to take some liquids this morning, really has not tried much for lunch and feeling very nauseated has also had increasing abdominal pain says currently 7 out of 10, had oral pain med within the past hour.  Some belching and burping Did not eat any dinner last evening    Objective   Vital signs in last 24 hours: Temp:  [97.7 F (36.5 C)-99.5 F (37.5 C)] 98.2 F (36.8 C) (08/17 1137) Pulse Rate:  [58-76] 68 (08/17 1137) Resp:  [16] 16 (08/17 0359) BP: (103-141)/(68-91) 141/91 (08/17 1137) SpO2:  [97 %-100 %] 100 % (08/17 1137) Last BM Date : 01/24/24 General: Young white male in NAD thin, Heart:  Regular rate and rhythm; no murmurs Lungs: Respirations even and unlabored, lungs CTA bilaterally Abdomen:  Soft, hyperactive bowel sounds some borborygmi, increase in tenderness across the epigastrium and hypogastrium, no rebound Extremities:  Without edema. Neurologic:  Alert and oriented,  grossly normal neurologically. Psych:  Cooperative. Normal mood and affect.  Intake/Output from previous day: No intake/output data recorded. Intake/Output this shift: No intake/output data recorded.  Lab Results: Recent Labs    01/25/24 0418  WBC 6.2  HGB 10.5*  HCT 31.3*  PLT 264   BMET Recent Labs    01/25/24 0418  NA 138  K 3.9  CL 99  CO2 29  GLUCOSE 104*  BUN 11  CREATININE 0.70  CALCIUM  8.8*   LFT Recent Labs    01/25/24 0418  PROT 6.7  ALBUMIN  3.4*  AST 21  ALT 26  ALKPHOS 68  BILITOT 0.4   PT/INR Recent Labs    01/25/24 0418 01/26/24 0355  LABPROT 20.1* 23.4*  INR 1.6* 2.0*       Assessment / Plan:    #17  33 year old male with acute on chronic pancreatitis, admitted 2 weeks ago.  Initially had some improvement and was able to tolerate solid diet for few days then developed nausea and vomiting earlier this week, and and has had continued difficulty taking p.o.'s, even clear liquids over the past 24 hours and now also complaining of increase in his pain level.  MRI/MRCP shows persistent active pancreatitis, diffuse stranding including extensive stranding and wall thickening of the gastric antrum, pylorus and proximal duodenum associated with the known complex fluid collection which had been present previously in addition has developed a new fluid collection during this admission, elongated collection 6.9 x 2.3 x 3 cm inferior to the pancreatic body and tail and adjacent to the lesser curve.  He vomited up his core track on Friday, we have been trying clear liquids with resource supplements but he is not able to take adequate nutrition orally at this time  #2 chronic anticoagulation-on Coumadin -status post mechanical aortic valve replacement #3 mild anemia stable #4 outpatient SIBO test was positive completing a course of Xifaxan  here- Will stop the oral antibiotic today, I am not sure this is really associated with any of his current symptoms.  #5 malnutrition secondary to above  Plan; continue small amounts of clear liquids as tolerates advised him not to push Restart lactated Ringer 's at 100 cc/h Have placed order for  core track placement likely will not be placed until tomorrow, then resume postpyloric feedings  Patient is scheduled for pancreatic protocol CT tomorrow, then depending on the majority of the walls of the more chronic bilobed pseudocyst then Dr. Wilhelmenia will decide if endoscopic drainage is feasible  Continue subcu octreotide -no proof of duct disruption but started due to rapid development of the newest pseudocyst  Patient's Coumadin  would need to be held prior to considering  any endoscopic pseudocyst drainage, and would need to bridge with heparin  Discussed with pharmacist today we will hold Coumadin  today, reassess in a.m.   Principal Problem:   Chronic recurrent pancreatitis (HCC) Active Problems:   S/P AVR (aortic valve replacement) and aortoplasty   Subtherapeutic international normalized ratio (INR)   Essential hypertension   Pseudocyst of pancreas   Acute on chronic pancreatitis (HCC)   On pre-exposure prophylaxis for HIV   History of asthma   History of alcohol abuse   Protein-calorie malnutrition, severe   Small intestinal bacterial overgrowth (SIBO)   Intraabdominal fluid collection     LOS: 15 days   Amy EsterwoodPA-C  01/26/2024, 12:15 PM    Attending physician's note   I have taken history, reviewed the chart and examined the patient. I performed a substantive portion of this encounter, including complete performance of at least one of the key components, in conjunction with the APP. I agree with the Advanced Practitioner's note, impression and recommendations.   Acute pancreatitis on chronic calcific pancreatitis with marked PD dilation.  Doubt PD disruption   Acute peripancreatic fluid collections (worsening)- New 6.9 x 2.3 x 3.0 cm fluid collection in the lesser sac.  Unchanged 3 x 2 x 3 cm fluid collection at [facility] with severe inflammation gastric antrum/pylorus.   Partial gastric outlet obstruction d/t above. Coretrak dislodged 8/16   Mechanical AVR on Coumadin  (INR 1.5-2)   Plan: -IVF/pain control/nausea control/IV PPIs/octreotide  -Pancreatic protocol CT in AM -Replace cortrak in AM for postpyloric feeds. -For now, continue full liquid diet -Appreciate Dr Melba recommendations. -Once acute phase is over, do recommend out appt with Dr. Leonor Dawn for consideration of [place]'s.   Anselm Bring, MD [facility] [phone]

## 2024-01-26 NOTE — Progress Notes (Signed)
 PHARMACY - ANTICOAGULATION CONSULT NOTE  Pharmacy Consult for Warfarin Indication: On-X mechanical AVR  Allergies  Allergen Reactions   Amoxicillin Hives    Patient Measurements: Height: 5' 11 (180.3 cm) Weight: 66 kg (145 lb 9.6 oz) IBW/kg (Calculated) : 75.3 HEPARIN  DW (KG): 71  Vital Signs: Temp: 97.8 F (36.6 C) (08/17 0731) Temp Source: Oral (08/17 0359) BP: 103/79 (08/17 0731) Pulse Rate: 67 (08/17 0731)  Labs: Recent Labs    01/24/24 0256 01/25/24 0418 01/26/24 0355  HGB  --  10.5*  --   HCT  --  31.3*  --   PLT  --  264  --   LABPROT 28.6* 20.1* 23.4*  INR 2.5* 1.6* 2.0*  CREATININE  --  0.70  --     Estimated Creatinine Clearance: 122.6 mL/min (by C-G formula based on SCr of 0.7 mg/dL).  Assessment: 33 yo M presenting with pancreatitis. Warfarin PTA regimen is 6 mg Monday and Thursday and 4.5 mg all other days (total weekly dose: 34.5 mg).   INR trended down to 1.6 on 8/16 after lower warfarin doses (1 mg and 2.5 mg) for 2 days when INR > 2.  Usual 4.5 mg dose given on 8/16 and INR remains therapeutic, trended up to 2.0 today.  May still trend up some. Discussed with GI > will hold warfarin today in case invasive procedure is needed later this week.  CT abd with increased fluid collection at head of pancreas concerning for peripancreatic abscess.  MRCP completed 8/14 and possible pancreatic CT on 8/18. Off tube feedings and on full liquid diet.  Of note, patient started rifaximin  8/9 which may decrease INR.   Goal of Therapy:  INR 1.5-2.0 Monitor platelets by anticoagulation protocol: Yes   Plan:  No Coumadin  today per discussion with GI. Continue daily PT/INR. Follow up CT on 8/18 and further plans.  Genaro Zebedee Calin, RPh 01/26/2024,9:14 AM

## 2024-01-26 NOTE — Progress Notes (Signed)
 Progress Note   Patient: [name] [name] FMW:[id number] DOB: Jun 15, 1990 DOA: 01/11/2024     15 DOS: the patient was seen and examined on 01/26/2024   Brief hospital course: 33 y.o. male with medical history significant of hypertension, bicuspid aortic valve with severe aortic insufficiency and moderate aortic stenosis s/p mechanical AV replacement on 09/27/2021 on chronic anticoagulation, recurrent pancreatitis, asthma, GERD, and prior history of alcohol abuse presents with abdominal pain found to have AKI pancreatitis with pseudocyst, GI was consulted and admitted for conservative management. Patient had multiple hospitalization for pancreatitis. Patient with decreased tolerance to diet and w/ severe malnutrition> cortrak placed post pyloric on 01/15/24, diet orally was slowly advanced  8/13.  Patient with dilation of the pancreatic duct with calcifications consistent with chronic pancreatitis, increase in the multi loculated fluid collection, decreased ascites 8/14.  Case discussed with gastroenterology.  MRCP of the abdomen ordered.  Okay to start tube feeding after MRCP done. 8/15.  Lipase up to 451.  MRCP showing diffuse inflammatory fat stranding about the pancreas consistent with acute pancreatitis.  Severe ductal dilation consistent with chronic stigmata of pancreatitis.  Interval development of a fluid collection 6.9 x 2.3 x 3.0 cm and a prior fluid collection 3.0 x 2.1 x 3.0 cm.  Started on octreotide  by gastroenterology 8/16.  Cortrak tube came out last night.  Case discussed with gastroenterology and will watch without tube at this point.  Keep on full liquid diet.  Lipase down to 142. 8/17.  Continue full liquid diet.   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  Continued abdominal pain.  CT scan showed very dilated pancreatic duct.  MRCP showing 2 fluid collections and a dilated pancreatic duct and findings consistent with acute  pancreatitis.  Octreotide  started by gastroenterology.  Cortrak tube came out on 8/15 with an episode of vomiting.  Spoke with gastroenterology and they will hold off on replacing tube at this point.  Continue to full liquid diet.  Gastroenterology ordered a repeat CT scan for tomorrow.  Pseudocyst of pancreas Case discussed with gastroenterology since he is not having a fever, he does not believe that this is an abscess.  Continue to hold off on antibiotics.  Small intestinal bacterial overgrowth (SIBO) On rifaximin  for 14 days  S/P AVR (aortic valve replacement) and aortoplasty Continue Coumadin   Essential hypertension On Toprol   History of alcohol abuse Remission for 10 months  Protein-calorie malnutrition, severe Cortrak tube came out overnight on 8/15.  As per GI okay to hold off replace at this point.  Patient on full liquid diet.        Subjective: Patient did not eat much yesterday secondary to feeling rumbling in his abdomen.  Has on and off pain.  No nausea vomiting no diarrhea.  Physical Exam: Vitals:   01/25/24 2333 01/26/24 0359 01/26/24 0731 01/26/24 1137  BP: 132/82 104/68 103/79 (!) 141/91  Pulse: 76 (!) 58 67 68  Resp: 16 16    Temp: 98 F (36.7 C) 99.5 F (37.5 C) 97.8 F (36.6 C) 98.2 F (36.8 C)  TempSrc: Oral Oral    SpO2: 97% 99% 98% 100%  Weight:      Height:       Physical Exam HENT:     Head: Normocephalic.     Mouth/Throat:     Pharynx: No oropharyngeal exudate.  Eyes:     General: Lids are normal.     Conjunctiva/sclera: Conjunctivae normal.  Cardiovascular:  Rate and Rhythm: Normal rate and regular rhythm.     Heart sounds: Normal heart sounds, S1 normal and S2 normal.  Pulmonary:     Breath sounds: No decreased breath sounds, wheezing, rhonchi or rales.  Abdominal:     Palpations: Abdomen is soft.     Tenderness: There is abdominal tenderness in the epigastric area.  Musculoskeletal:     Right lower leg: No swelling.      Left lower leg: No swelling.  Skin:    General: Skin is warm.     Findings: No rash.  Neurological:     Mental Status: He is alert and oriented to person, place, and time.     Data Reviewed: INR 2.0  Disposition: Status is: Inpatient Remains inpatient appropriate because: Repeat CT scan tomorrow  Planned Discharge Destination: Home    Time spent: 28 minutes  Author: Charlie Patterson, MD 01/26/2024 1:01 PM  For on call review [email or web address].

## 2024-01-27 ENCOUNTER — Inpatient Hospital Stay (HOSPITAL_COMMUNITY)

## 2024-01-27 DIAGNOSIS — E43 Unspecified severe protein-calorie malnutrition: Secondary | ICD-10-CM | POA: Diagnosis not present

## 2024-01-27 DIAGNOSIS — K859 Acute pancreatitis without necrosis or infection, unspecified: Secondary | ICD-10-CM | POA: Diagnosis not present

## 2024-01-27 DIAGNOSIS — K861 Other chronic pancreatitis: Secondary | ICD-10-CM | POA: Diagnosis not present

## 2024-01-27 DIAGNOSIS — K863 Pseudocyst of pancreas: Secondary | ICD-10-CM | POA: Diagnosis not present

## 2024-01-27 DIAGNOSIS — I10 Essential (primary) hypertension: Secondary | ICD-10-CM | POA: Diagnosis not present

## 2024-01-27 DIAGNOSIS — K638219 Small intestinal bacterial overgrowth, unspecified: Secondary | ICD-10-CM | POA: Diagnosis not present

## 2024-01-27 LAB — LIPASE, BLOOD: Lipase: 344 U/L — ABNORMAL HIGH (ref 11–51)

## 2024-01-27 LAB — COMPREHENSIVE METABOLIC PANEL WITH GFR
ALT: 22 U/L (ref 0–44)
AST: 17 U/L (ref 15–41)
Albumin: 3.3 g/dL — ABNORMAL LOW (ref 3.5–5.0)
Alkaline Phosphatase: 72 U/L (ref 38–126)
Anion gap: 9 (ref 5–15)
BUN: 10 mg/dL (ref 6–20)
CO2: 30 mmol/L (ref 22–32)
Calcium: 9.3 mg/dL (ref 8.9–10.3)
Chloride: 99 mmol/L (ref 98–111)
Creatinine, Ser: 0.86 mg/dL (ref 0.61–1.24)
GFR, Estimated: >60 mL/min (ref >60)
Glucose, Bld: 104 mg/dL — ABNORMAL HIGH (ref 70–99)
Potassium: 4 mmol/L (ref 3.5–5.1)
Sodium: 138 mmol/L (ref 135–145)
Total Bilirubin: 0.5 mg/dL (ref 0.0–1.2)
Total Protein: 6.6 g/dL (ref 6.5–8.1)

## 2024-01-27 LAB — CBC
HCT: 31.7 % — ABNORMAL LOW (ref 39.0–52.0)
Hemoglobin: 10.6 g/dL — ABNORMAL LOW (ref 13.0–17.0)
MCH: 29.2 pg (ref 26.0–34.0)
MCHC: 33.4 g/dL (ref 30.0–36.0)
MCV: 87.3 fL (ref 80.0–100.0)
Platelets: 354 K/uL (ref 150–400)
RBC: 3.63 MIL/uL — ABNORMAL LOW (ref 4.22–5.81)
RDW: 13.5 % (ref 11.5–15.5)
WBC: 7.6 K/uL (ref 4.0–10.5)
nRBC: 0 % (ref 0.0–0.2)

## 2024-01-27 LAB — GLUCOSE, CAPILLARY
Glucose-Capillary: 94 mg/dL (ref 70–99)
Glucose-Capillary: 87 mg/dL (ref 70–99)
Glucose-Capillary: 98 mg/dL (ref 70–99)
Glucose-Capillary: 96 mg/dL (ref 70–99)
Glucose-Capillary: 139 mg/dL — ABNORMAL HIGH (ref 70–99)

## 2024-01-27 LAB — PROTIME-INR
INR: 2.4 — ABNORMAL HIGH (ref 0.8–1.2)
Prothrombin Time: 27.7 s — ABNORMAL HIGH (ref 11.4–15.2)

## 2024-01-27 MED ORDER — OSMOLITE 1.5 CAL PO LIQD
1000.0000 mL | ORAL | Status: DC
  Administered 2024-01-27: 1000 mL
  Filled 2024-01-27 (×3): qty 1000

## 2024-01-27 MED ORDER — IOHEXOL 350 MG/ML SOLN
100.0000 mL | Freq: Once | INTRAVENOUS | Status: AC | PRN
  Administered 2024-01-27: 100 mL via INTRAVENOUS

## 2024-01-27 MED ORDER — FREE WATER
75.0000 mL | Status: DC
  Administered 2024-01-27 – 2024-01-28 (×12): 75 mL

## 2024-01-27 MED ORDER — OXYCODONE-ACETAMINOPHEN 5-325 MG PO TABS
2.0000 | ORAL_TABLET | ORAL | Status: DC | PRN
  Administered 2024-01-27 – 2024-02-12 (×32): 2 via ORAL
  Filled 2024-01-27 (×33): qty 2

## 2024-01-27 NOTE — Progress Notes (Signed)
 Nutrition Follow-up  DOCUMENTATION CODES:   Severe malnutrition in context of chronic illness  INTERVENTION:  -Continue to be NPO per GI -EN to be re-initiated today after Cortrak was dislodged 8/15, now replaced this morning -Continue ProSource Plus BID to promote adequate protein with low fat content -Discussed goal of increased kcal/pro intake to prevent further weight loss, fuel body  -Initiate Osmolite 1.5 @ 30 ml/hr to be advanced as tolerated 15 ml/hr q-4 to a goal rate of 60 ml/hr via Cortrak.  -Add ProSource TF20 qday to meet protein needs (provides 20 g pro, 80 kcal)             At goal this provides 2240 kcal, 110 g protein, 1094 ml fluid             Also, 0 g fiber, 71 g fat              Free water  flushes 75 ml q-2 to provide a total of 1994 ml fluid as pt is now NPO for foreseeable future   NUTRITION DIAGNOSIS:   Severe Malnutrition related to vomiting, chronic illness, altered GI function as evidenced by moderate fat depletion, severe muscle depletion.  Ongoing  GOAL:   Patient will meet greater than or equal to 90% of their needs  Progressing  MONITOR:   PO intake, Weight trends, TF tolerance, Supplement acceptance, Diet advancement, Labs, Skin  REASON FOR ASSESSMENT:   New TF, Diagnosis  ASSESSMENT:   Hx hypertension, bicuspid aortic valve with severe aortic insufficiency and moderate aortic stenosis s/p mechanical aortic valve replacement in arthroplasty on 09/27/2021 on chronic anticoagulation, recurrent pancreatitis, asthma, GERD, and prior history of alcohol abuse presents with abdominal pain.  Spoke to pt at bedside. Pt states feeling alright today, continues with some abdominal pain requiring IV narcotics. Per GI, plan to try to wean to PO narcotics, continue NPO, anticipate enteral nutrition for estimated 4-6 weeks. Pt had Cortrak replaced today after it became dislodged 8/15. EN to be re-initiated, pt agreeable to this plan. Pt with no documented PO  intake since 8/10, NPO started @ midnight 8/18 (today). Documented weight is increased desirably from pt's low of 64.5 kg (now 68.8 kg); suspect fluid fluctuations contributing to weight changes. Last BM 8/15. Pt with questions regarding what EN will look like at home. Discussed goal of cyclic EN to allow time off pump, discussed monitoring for s/sx of intolerance and monitoring body weight to assess adequate kcal/pro intake. Will discuss further once closer to d/c. Pt denies additional questions/concerns at this time, will continue to monitor, RDN available prn.   Labs BG 87-104 Albumin  3.3 Lipase 344 H/H 10.6/31.7  Medications  aspirin  EC  81 mg Oral Daily   feeding supplement  1 Container Oral TID BM   feeding supplement (PROSource TF20)  60 mL Per Tube Daily   lipase/protease/amylase  72,000 Units Oral TID AC   loratadine   10 mg Oral Daily   metoprolol  succinate  12.5 mg Oral QHS   pantoprazole  (PROTONIX ) IV  40 mg Intravenous Q12H   sodium chloride  flush  3 mL Intravenous Q12H   Warfarin - Pharmacist Dosing Inpatient   Does not apply q1600     NUTRITION - FOCUSED PHYSICAL EXAM:  Flowsheet Row Most Recent Value  Orbital Region Moderate depletion  Upper Arm Region Moderate depletion  Thoracic and Lumbar Region Moderate depletion  Buccal Region Moderate depletion  Temple Region Moderate depletion  Clavicle Bone Region Severe depletion  Clavicle and Acromion  Bone Region Severe depletion  Scapular Bone Region Moderate depletion  Dorsal Hand Moderate depletion  Patellar Region Moderate depletion  Anterior Thigh Region Moderate depletion  Posterior Calf Region Moderate depletion  Edema (RD Assessment) None  Hair Reviewed  Eyes Reviewed  Mouth Reviewed  Skin Reviewed  Nails Reviewed    Diet Order:   Diet Order             Diet NPO time specified  Diet effective now                   EDUCATION NEEDS:   Education needs have been addressed  Skin:  Skin  Assessment: Reviewed RN Assessment  Last BM:  8/12 type1  Height:   Ht Readings from Last 1 Encounters:  01/11/24 5' 11 (1.803 m)    Weight:   Wt Readings from Last 1 Encounters:  01/27/24 68.8 kg    BMI:  Body mass index is 21.15 kg/m.  Estimated Nutritional Needs:   Kcal:  2000-2350 kcal  Protein:  105-140 g  Fluid:  >/=2L  [name] [name], RDN, LDN Registered Dietitian Nutritionist RD Inpatient Contact Info in [place]

## 2024-01-27 NOTE — Subjective & Objective (Signed)
 Pt seen and examined. Tolerated tube feeds last night. Up to rate of 60 ml/hr of Osmolite 1.5. pt works at [employer]. He is pretty sure that he will not be allowed to work with Cortrak in his nose.  Pain is well controlled with 10 mg percocet q4h prn. Pt states pain control lasting about 5 hours. IV dilaudid  was stopped yesterday.

## 2024-01-27 NOTE — Assessment & Plan Note (Signed)
 Pt has not had any PREP in several weeks. He can restart this at home if needed.

## 2024-01-27 NOTE — Progress Notes (Signed)
 Patient ID: [name] [name], male   DOB: 23-Jun-1990, 33 y.o.   MRN: [id number]    Progress Note   Subjective  Day # 16 CC; acute on chronic pancreatitis with development of new pseudocyst  Patient describes still some abdominal pain that is requiring use of IV narcotic medications.  He did tolerate core track placement this morning and is awaiting restarting of his tube feeds.  Patient says his #1 priority is to try to get home    Objective   Vital signs in last 24 hours: Temp:  [98 F (36.7 C)-100.2 F (37.9 C)] 98.6 F (37 C) (08/18 0809) Pulse Rate:  [63-74] 63 (08/18 0809) Resp:  [16-20] 20 (08/18 0809) BP: (110-141)/(76-91) 110/76 (08/18 0809) SpO2:  [96 %-100 %] 96 % (08/18 0809) Weight:  [68.8 kg] 68.8 kg (08/18 0500) Last BM Date : 01/24/24 General: Young white male in NAD thin, Heart:  Regular rate and rhythm; no murmurs Lungs: Respirations even and unlabored, lungs CTA bilaterally Abdomen:  Soft, nontender, though the patient recently received pain medications. Nondistended Extremities:  Without edema. Neurologic:  Alert and oriented,  grossly normal neurologically. Psych:  Cooperative. Normal mood and affect.  Intake/Output from previous day: 08/17 0701 - 08/18 0700 In: 3432.6 [I.V.:860.6; NG/GT:2572] Out: -  Intake/Output this shift: No intake/output data recorded.  Lab Results: Recent Labs    01/25/24 0418 01/27/24 0229  WBC 6.2 7.6  HGB 10.5* 10.6*  HCT 31.3* 31.7*  PLT 264 354   BMET Recent Labs    01/25/24 0418 01/27/24 0229  NA 138 138  K 3.9 4.0  CL 99 99  CO2 29 30  GLUCOSE 104* 104*  BUN 11 10  CREATININE 0.70 0.86  CALCIUM  8.8* 9.3   LFT Recent Labs    01/27/24 0229  PROT 6.6  ALBUMIN  3.3*  AST 17  ALT 22  ALKPHOS 72  BILITOT 0.5   PT/INR Recent Labs    01/26/24 0355 01/27/24 0229  LABPROT 23.4* 27.7*  INR 2.0* 2.4*   CT A/P w/contrast 01/27/24: IMPRESSION: 1. Areas of curvilinear gas density are seen  between the dependent intraluminal gastric fluid and the wall of the stomach in the posterior and lateral portions of the fundus, concerning for pneumatosis. The possibility that this represents a layer of gas trapped between the gastric contents and the wall is not entirely excluded, but gas is tracking along the dependent wall the stomach which would be somewhat unusual. 2. Marked dilatation of the main pancreatic duct measuring 12 mm diameter today compared to about 11 mm diameter previously. Pancreatic parenchyma enhances throughout. 3. The fluid collection identified on previous MRI between the pancreatic tail and lesser curvature of the stomach measures 5.7 x 2.0 cm today compared to 6.9 x 2.3 cm on the previous MRI. 4. Bilobed fluid collection anterior to the pancreatic head has increased from 2.3 x 1.3 cm previously. 5. 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 The central splenic vein is also markedly attenuated at the confluence but splenic vein remains patent. 6. Scattered tiny foci of extraluminal gas in the anterior abdomen. The volume of this trace gas appear stable to minimally decreased in the interval since prior CT. One collection is seen just deep to the right rectus musculature on axial 93/5. This was present on the previous CT and may be within the preperitoneal space as opposed to truly intraperitoneal in location although  intraperitoneal gas is a possibility. Another collection is seen along the left rectus musculature but appears to be within the left rectus sheath. Both of these findings were present previously. 7. Trace free fluid in the pelvis.    Assessment / Plan:    #24 33 year old male with acute on chronic pancreatitis, admitted 2 weeks ago.    MRI/MRCP 8/14 showed persistent active pancreatitis, diffuse stranding including extensive stranding and wall thickening of the  gastric antrum, pylorus and proximal duodenum associated with the known complex fluid collection which had been present previously in addition has developed a new fluid collection during this admission, elongated collection 6.9 x 2.3 x 3 cm inferior to the pancreatic body and tail and adjacent to the lesser curve.  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  Patient's largest peripancreatic fluid collection is now slightly smaller compared to prior imaging.  However his other fluid collection has increased slightly in size.  Discussed this case with radiology and it appears that the larger fluid collection near the pancreatic tail and lesser curvature is still an acute pancreatic fluid collection.  However the bilobed fluid collection anterior to the pancreatic head appears to have developed more features consistent with a pseudocyst with a mature wall.  Will discuss with Dr. Wilhelmenia later today.  At this time it is not clear to me that the patient is symptomatic due to his gastric pneumatosis since he has already had ongoing pain as a result of his pancreatitis.  Will closely monitor for worsening symptoms, particularly signs of infection, and can initiate antibiotic therapy at any point.  Dr. Dasie with surgery has seen the patient today and recommends no operative intervention at this time.  Patient just had his core track replaced today and will restart on tube feeds.  #2 chronic anticoagulation-on Coumadin -status post mechanical aortic valve replacement. Goal INR is 1.5-2 #3 mild anemia stable #4 outpatient SIBO test was positive. Completed his course of rifaximin  here.  #5 malnutrition secondary to above  Plan;  - Will attempt to get his core track tube feeds back to goal.  Would presume that he will remain on tube feeds for at least the next 4 to 6 weeks as an outpatient - Continue IV LR infusion - Surgery is  discontinued octreotide  due to concern of developing gastric pneumatosis - Will have our biliary specialist Dr. Wilhelmenia review patient's new CT iamging - Would recommend attempting to wean off of IV narcotics and transition to p.o. narcotics since patient is very eager to go home. Will discuss with hospitalist team   Principal Problem:   Chronic recurrent pancreatitis (HCC) Active Problems:   S/P AVR (aortic valve replacement) and aortoplasty   Subtherapeutic international normalized ratio (INR)   Essential hypertension   Pseudocyst of pancreas   Acute on chronic pancreatitis (HCC)   On pre-exposure prophylaxis for HIV   History of asthma   History of alcohol abuse   Protein-calorie malnutrition, severe   Small intestinal bacterial overgrowth (SIBO)   Intraabdominal fluid collection     LOS: 16 days   Rosario JAYSON Kidney MD  01/27/2024, 10:47 AM

## 2024-01-27 NOTE — TOC Progression Note (Addendum)
 Transition of Care [facility]) - Progression Note    Patient Details  Name: [name] [name] MRN: [id number] Date of Birth: November 27, 1990  Transition of [facility]) CM/SW Contact  [name] JONELLE Joe, RN Phone Number: 01/27/2024, 12:15 PM  Clinical Narrative:    CM spoke with Dr. Laurence and Dr. Federico and Federico, MD is requesting that patient be sent home with Cortrak and tube feedings for home.  I called and spoke with Holley Herring, RNCM with [employer] and asked that insurance authorization be started for enteral feedings, pump and equipment.  If approved - [employer] will provide dME and teaching prior to discharge to home.    CM with IP Care management will continue to follow for IP Care needs to return home when insurance authorization for Enteral feeds is approved.  01/27/24 - 1542 - I spoke with Dr. Laurence and he states that patient would be at risk for aspiration if he received pm Cortrak feedings and recommends only daytime tube feeding while patient is awake during the day.  Dietitian/ Dr. Federico are involved with the patient's care as well.  MD team is continuing to have conversations with the patient regarding safe discharge plan for home to receive enteral feedings.  Holley Herring, RNCM with [employer] was updated regarding pending plan for enteral feeds for home that have been been determined at this time.  Insurance will not be started until plan is in place.  CM with IP Care management team will continue to follow the patient for needs for home as patient progresses.                     Expected Discharge Plan and Services                                               Social Drivers of Health (SDOH) Interventions SDOH Screenings   Food Insecurity: No Food Insecurity (01/11/2024)  Housing: Low Risk  (01/11/2024)  Transportation Needs: No Transportation Needs (01/11/2024)  Utilities: Not At Risk (01/11/2024)  Depression (PHQ2-9): Medium Risk (09/09/2023)   Financial Resource Strain: Low Risk  (04/25/2023)  Physical Activity: Sufficiently Active (04/25/2023)  Social Connections: Unknown (04/25/2023)  Stress: No Stress Concern Present (04/25/2023)  Tobacco Use: Low Risk  (01/20/2024)    Readmission Risk Interventions    01/15/2024   11:03 AM  Readmission Risk Prevention Plan  Post Dischage Appt Complete  Medication Screening Complete  Transportation Screening Complete

## 2024-01-27 NOTE — Procedures (Signed)
 Cortrak  Person Inserting Tube:  Mady Dolly, RD Tube Type:  Cortrak - 55 inches Tube Size:  10 Tube Location:  Left nare Secured by: Bridle Technique Used to Measure Tube Placement:  Marking at nare/corner of mouth Cortrak Secured At:  115 cm   Cortrak Tube Team Note:  Consult received to place a Cortrak feeding tube.   X-ray is required. RN may begin using tube post confirmation of tube placement.   If the tube becomes dislodged please keep the tube and contact the Cortrak team at [email or web address] for replacement.  If after hours and replacement cannot be delayed, place a NG tube and confirm placement with an abdominal x-ray.    Dolly Mady MS, RD, LDN Registered Dietitian Clinical Nutrition RD Inpatient Contact Info in [employer]

## 2024-01-27 NOTE — Progress Notes (Addendum)
 Radiologist called to report CT findings concerning for gastric pneumatosis.   Patient is resting comfortably, reports increase in abdominal pain over the past 1-2 days which he currently rates as a 4 or 5 out of 10.   Abdomen is soft, tender in epigastrium and LUQ. Labs and vitals are stable.   Discussed the CT findings with Dr. Dasie of general surgery who recommended NPO and NG tube.

## 2024-01-27 NOTE — Plan of Care (Signed)

## 2024-01-27 NOTE — Progress Notes (Signed)
 PROGRESS NOTE    [name] [name]  FMW:[id number] DOB: Jan 03, 1991 DOA: 01/11/2024 PCP: Lucius Krabbe, NP  Subjective: Pt seen and examined. Had his cortrak replaced with post-pyloric placement. 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 No operative intervention planned.   Hospital Course: 33 y.o. male with medical history significant of hypertension, bicuspid aortic valve with severe aortic insufficiency and moderate aortic stenosis s/p mechanical AV replacement on 09/27/2021 on chronic anticoagulation, recurrent pancreatitis, asthma, GERD, and prior history of alcohol abuse presents with abdominal pain found to have AKI pancreatitis with pseudocyst, GI was consulted and admitted for conservative management. Patient had multiple hospitalization for pancreatitis. Patient with decreased tolerance to diet and w/ severe malnutrition> cortrak placed post pyloric on 01/15/24, diet orally was slowly advanced  8/13.  Patient with dilation of the pancreatic duct with calcifications consistent with chronic pancreatitis, increase in the multi loculated fluid collection, decreased ascites 8/14.  Case discussed with gastroenterology.  MRCP of the abdomen ordered.  Okay to start tube feeding after MRCP done. 8/15.  Lipase up to 451.  MRCP showing diffuse inflammatory fat stranding about the pancreas consistent with acute pancreatitis.  Severe ductal dilation consistent with chronic stigmata of pancreatitis.  Interval development of a fluid collection 6.9 x 2.3 x 3.0 cm and a prior fluid collection 3.0 x 2.1 x 3.0 cm.  Started on octreotide  by gastroenterology 8/16.  Cortrak tube came out last night.  Case discussed with gastroenterology and will watch without tube at this point.  Keep on full liquid diet.  Lipase down to 142. 8/17.  Continue full liquid diet.   CC: abd pain HPI: [name] [name] is a 33 y.o. male with medical history significant of  hypertension, bicuspid aortic valve with severe aortic insufficiency and moderate aortic stenosis s/p mechanical aortic valve replacement in arthroplasty on 09/27/2021 on chronic anticoagulation, recurrent pancreatitis, asthma, GERD, and prior history of alcohol abuse presents with abdominal pain.   The patient has been experiencing abdominal pain since around 9 PM last night, describing it as more intense than usual and primarily located in the left upper quadrant, with increased pain on the right side as well. The pain is exacerbated by movement. He has nausea, but has not vomited.   He has a history of pancreatic pseudocyst, which was previously evaluated with an endoscopy in April. A follow-up endoscopy was advised six months later.  At that time he reported also having at least one pancreatic stone in a duct. He was just recently hospitalized in June for reoccurrence of pancreatitis and was treated with IV fluids and pain medication. He has experienced flare-ups that did not require hospitalization, with the most recent one occurring a week and a half ago.   He has a history of heart surgery with valve replacement and is on anticoagulation therapy with an INR goal of 1.5 to 2.   He has not consumed alcohol in the past ten months. He reports having loose stools and mild gas but no fever or chills. He has been off Truvada for about a week and a half due to stomach upset. Current medications include Creon , which he takes with meals, and metoprolol .   In the ED patient was noted to be mildly bradycardic with all vital signs otherwise maintained.  Labs significant for lipase 338, WBC 12.3, and hemoglobin 12.7.  CT scan of the abdomen pelvis noted severe sequelae of advanced pancreatitis with pronounced and confluent ongoing inflammation of the  distal stomach the proximal duodenum and adjacent hepatic flexure with a 3 cm pancreatic pseudocyst and 5 cm pancreatic complex cyst or hematoma with regression since  June.  Patient was given 1 L bolus of lactated Ringer 's, morphine , and Zofran .  Significant Events: Admitted 01/11/2024 for recurrent pancreatitis with pseudocyst 01-11-2024 GI consulted due to recurrent pancreatitis with pseudocyst formation 01-11-2024 through 01-21-2024 pt spent several days NPO, cortrak placed on 01-15-2024 and he was started on post-pylori feedings. 8/13.  Patient with dilation of the pancreatic duct with calcifications consistent with chronic pancreatitis, increase in the multi loculated fluid collection, decreased ascites 8/14.  Case discussed with gastroenterology.  MRCP of the abdomen ordered.  Okay to start tube feeding after MRCP done. 8/15.  Lipase up to 451.  MRCP showing diffuse inflammatory fat stranding about the pancreas consistent with acute pancreatitis.  Severe ductal dilation consistent with chronic stigmata of pancreatitis.  Interval development of a fluid collection 6.9 x 2.3 x 3.0 cm and a prior fluid collection 3.0 x 2.1 x 3.0 cm.  Started on octreotide  by gastroenterology 8/16.  Cortrak tube came out last night.  Case discussed with gastroenterology and will watch without tube at this point.  Keep on full liquid diet.  Lipase down to 142. 8/17.  Continue full liquid diet. 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 Pt had been on octreotide  started by GI since 01-24-2024. Surgery stopped Octreotide .  Admission Labs: INR 1.0 Lipase 338 Na 138, K 4.0, CO2 of 25, BUN 9, Scr 0.77, glu 132 WBC 12.3, HgB 12.7, plt 302 CRP < 0.5  Admission Imaging Studies: CT abd/pelvis Severe sequelae of advanced Pancreatitis, with pronounced and confluent ongoing inflammation of the distal stomach, the proximal duodenum, and the adjacent hepatic flexure of colon. But 3 cm pancreatic pseudocyst and 5 cm peripancreatic complex cyst or hematoma have both regressed since June. Small volume ascites in the right upper quadrant, the right  gutter, and the pelvis not significantly changed. And stable narrowing of the SMV which remains patent. No new or progressed complicating features identified since June. 2. Chronic sternotomy.  Minor lung base atelectasis.  Significant Labs: 01-17-2024 prealbumin 22 01-23-2024 CA 19-9 was 8 01-23-2024 CEA <0.6  Significant Imaging Studies: 01-22-2024 CT abd/pelvis Dilation of the pancreatic duct and coarse calcifications in the pancreatic head consistent with stigmata of chronic pancreatitis, similar to prior. Normal pancreatic enhancement. 2. Interval increase in size of a multiloculated fluid collection with rim enhancement anterior to the pancreatic neck extending to posterior wall of the gastric antrum , most consistent with peripancreatic abscess formation. Gastric wall connection cannot be excluded. Recommend future follow-ups with dedicated MRCP with contrast if clinically warranted. 3.  Decreased ascites and perigastric/perihepatic free fluid. 4. Gastrojejunostomy tube terminates in proximal jejunum. Normal gastric size. 01-23-2024 MRCP Diffuse inflammatory fat stranding about the pancreas, consistent with acute pancreatitis. 2. Severe pancreatic ductal dilatation measuring up to 1.0 cm in caliber in the pancreatic neck, the pancreatic duct effaced within the central pancreatic head. 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 3. Since earlier same day examination, interval development of an elongated acute pancreatic fluid collection superior to the pancreatic body and tail and adjacent to the lesser curvature of the stomach, measuring 6.9 x 2.3 x 3.0 cm. 4. Unchanged heterogeneous fluid collection ventral to the pancreatic head, involving the severely inflamed gastric antrum and pylorus measuring 3.0 x 2.1 x 3.0 cm, consistent with acute pancreatic fluid collection and concerning  for ulceration to the bowel lumen given size, complexity, and significant involvement of the bowel  wall. 5. Presence or absence of infection within the above described fluid collections is not established by imaging. 6. Small volume ascites. 01-27-2024 CT abd/pelvis Areas of curvilinear gas density are seen between the dependent intraluminal gastric fluid and the wall of the stomach in the posterior and lateral portions of the fundus, concerning for pneumatosis. The possibility that this represents a layer of gas trapped between the gastric contents and the wall is not entirely excluded, but gas is tracking along the dependent wall the stomach which would be somewhat unusual. 2. Marked dilatation of the main pancreatic duct measuring 12 mm diameter today compared to about 11 mm diameter previously. Pancreatic parenchyma enhances throughout. 3. The fluid collection identified on previous MRI between the pancreatic tail and lesser curvature of the stomach measures 5.7 x 2.0 cm today compared to 6.9 x 2.3 cm on the previous MRI. 4. Bilobed fluid collection anterior to the pancreatic head has increased from 2.3 x 1.3 cm previously. 5. 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 The central splenic vein is also markedly attenuated at the confluence but splenic vein remains patent. 6. Scattered tiny foci of extraluminal gas in the anterior abdomen. The volume of this trace gas appear stable to minimally decreased in the interval since prior CT. One collection is seen just deep to the right rectus musculature on axial 93/5. This was present on the previous CT and may be within the preperitoneal space as opposed to truly intraperitoneal in location although intraperitoneal gas is a possibility. Another collection is seen along the left rectus musculature but appears to be within the left rectus sheath. Both of these findings were present previously. 7. Trace free fluid in the pelvis.  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  Continued abdominal pain.  CT scan showed very dilated pancreatic duct.  MRCP showing 2 fluid collections and a dilated pancreatic duct and findings consistent with acute pancreatitis.  Octreotide  started by gastroenterology.  Cortrak tube came out on 8/15 with an episode of vomiting.  Spoke with gastroenterology and they will hold off on replacing tube at this point.  Continue to full liquid diet.  Gastroenterology ordered a repeat CT scan for tomorrow.  01-27-2024 CT abd/pelvis shows gastric pneumatosis. General surgery consulted. They have stopped octreotide . Pt had Cortrak replaced. GI recommending pt go home with Cortrak and home [place] tube feeds. I have stopped IV dilaudid . Pt understand he cannot go home with IV opiates. Have increased his percocet to 10 mg q4h prn pain. Will adjust po pain meds as needed.  Pseudocyst of pancreas 01-11-2024 through 01-26-2024 Case discussed with gastroenterology since he is not having a fever, he does not believe that this is an abscess.  Continue to hold off on antibiotics.  Small intestinal bacterial overgrowth (SIBO) 01-11-2024 through 01-26-2024 pt completed rifaximin  on 01-26-2024. He was treated with 14 days  of therapy.  Protein-calorie malnutrition, severe Nutrition Status: Nutrition Problem: Severe Malnutrition Etiology: vomiting, chronic illness, altered GI function Signs/Symptoms: moderate fat depletion, severe muscle depletion Interventions: Refer to RD note for recommendations    History of alcohol abuse Remission for 10  months  History of asthma Stable. No asthma flares.  On pre-exposure prophylaxis for HIV Pt has not had any PREP in several weeks. He can restart this at home if needed.  Essential hypertension Continue Toprol -XL 12.5 mg daily.  S/P AVR (aortic valve replacement) and aortoplasty Pt has mechanical aortic valve and requires life long Coumadin  therapy. INR 2-3   DVT prophylaxis:   Coumadin    Code Status: Full Code Family Communication: no family at bedside. Pt is decisional. Disposition Plan: return home Reason for continuing need for hospitalization: remains on enteral feeds.  Objective: Vitals:   01/27/24 0357 01/27/24 0500 01/27/24 0809 01/27/24 1208  BP: 122/83  110/76 119/81  Pulse: 74  63 64  Resp: 16  20 20   Temp: 98 F (36.7 C)  98.6 F (37 C) 98.4 F (36.9 C)  TempSrc: Oral  Oral Oral  SpO2: 96%  96% 99%  Weight:  68.8 kg    Height:        Intake/Output Summary (Last 24 hours) at 01/27/2024 1645 Last data filed at 01/27/2024 1520 Gross per 24 hour  Intake 3582.57 ml  Output --  Net 3582.57 ml   Filed Weights   01/21/24 0815 01/23/24 0500 01/27/24 0500  Weight: 66 kg 66 kg 68.8 kg    Examination:  Physical Exam Vitals and nursing note reviewed.  Constitutional:      General: He is not in acute distress.    Appearance: He is not toxic-appearing or diaphoretic.  HENT:     Head: Normocephalic and atraumatic.  Neck:     Comments: +cortrak in left nare. Nasal bridle in place Pulmonary:     Effort: Pulmonary effort is normal. No respiratory distress.  Abdominal:     General: Abdomen is flat.  Skin:    General: Skin is dry.  Neurological:     Mental Status: He is alert and oriented to person, place, and time.    Data Reviewed: I have personally reviewed following labs and imaging studies  CBC: Recent Labs  Lab 01/22/24 0222 01/25/24 0418 01/27/24 0229  WBC 7.2 6.2 7.6  NEUTROABS 3.3 3.3  --   HGB 11.4* 10.5* 10.6*  HCT 34.0* 31.3* 31.7*   MCV 87.0 86.5 87.3  PLT 338 264 354   Basic Metabolic Panel: Recent Labs  Lab 01/22/24 0222 01/25/24 0418 01/27/24 0229  NA 138 138 138  K 4.0 3.9 4.0  CL 101 99 99  CO2 28 29 30   GLUCOSE 102* 104* 104*  BUN 16 11 10   CREATININE 0.74 0.70 0.86  CALCIUM  9.5 8.8* 9.3   GFR: Estimated Creatinine Clearance: 118.9 mL/min (by C-G formula based on SCr of 0.86 mg/dL). Liver Function Tests: Recent Labs  Lab 01/22/24 0222 01/25/24 0418 01/27/24 0229  AST 25 21 17   ALT 26 26 22   ALKPHOS 66 68 72  BILITOT 0.2 0.4 0.5  PROT 7.0 6.7 6.6  ALBUMIN  3.6 3.4* 3.3*   Recent Labs  Lab 01/22/24 0222 01/24/24 0256 01/25/24 0418 01/27/24 0229  LIPASE 155* 451* 142* 344*   Coagulation Profile: Recent Labs  Lab 01/23/24 0226 01/24/24 0256 01/25/24 0418 01/26/24 0355 01/27/24 0229  INR 2.4* 2.5* 1.6* 2.0* 2.4*   CBG: Recent Labs  Lab 01/26/24 1640 01/27/24 0101 01/27/24 0359 01/27/24 0809 01/27/24 1210  GLUCAP 103* 94 87 98 96   Radiology Studies: DG Abd Portable 1V Result Date: 01/27/2024 CLINICAL DATA:  Feeding tube placement. EXAM: PORTABLE ABDOMEN - 1 VIEW COMPARISON:  01/21/2024 FINDINGS: Feeding tube tip is in the distal duodenum at the ligament of Treitz. Visualized upper abdomen demonstrates nonspecific bowel gas pattern. IMPRESSION: Feeding tube tip is in the distal duodenum at the ligament of Treitz. Electronically Signed   By: Camellia Candle M.D.   On: 01/27/2024 10:50   CT ABDOMEN PELVIS W WO CONTRAST Addendum Date: 01/27/2024 ADDENDUM REPORT: 01/27/2024 05:49 ADDENDUM: I discussed the results of this study directly with Dr. Charlton by telephone at approximately 0549 hours on 01/27/2024. Electronically Signed   By: Camellia Candle M.D.   On: 01/27/2024 05:49   Result Date: 01/27/2024 CLINICAL DATA:  Pancreatitis. EXAM: CT ABDOMEN AND PELVIS WITHOUT AND WITH CONTRAST TECHNIQUE: Multidetector CT imaging of the abdomen and pelvis was performed following the standard  protocol before and following the bolus administration of intravenous contrast. RADIATION DOSE REDUCTION: This exam was performed according to the departmental dose-optimization program which includes automated exposure control, adjustment of the mA and/or kV according to patient size and/or use of iterative reconstruction technique. CONTRAST:  OMNIPAQUE  IOHEXOL  350 MG/ML SOLN COMPARISON:  01/23/2024 MRI.  01/22/2024 CT scan. FINDINGS: Lower chest: No acute findings. Hepatobiliary: No suspicious focal abnormality within the liver parenchyma. There is no evidence for gallstones, gallbladder wall thickening, or pericholecystic fluid. No intrahepatic or extrahepatic biliary dilation. Pancreas: Marked dilatation of the main pancreatic duct noted measuring 12 mm diameter today compared to about 11 mm diameter 1 measured again on the previous CT. Scattered calcification noted in the parenchyma of the body and tail the pancreas as before compatible with sequelae of chronic pancreatitis. Bilobed 3.5 x 1.9 cm fluid collection anterior to the pancreatic head has increased from 2.3 x 1.3 cm previously. The fluid collection identified on previous MRI between the pancreatic tail and lesser curvature of the stomach measures 5.7 x 2.0 cm today compared to 6.9 x 2.3 cm on the previous MRI. There is thickening in the distal stomach and duodenum with perigastric compared duodenal fluid as before. Small fluid collection seen along the inferior capsule of the lateral segment left liver, similar to prior. Pancreatic parenchyma enhances throughout. Spleen: No splenomegaly. No suspicious focal mass lesion. Adrenals/Urinary Tract: No adrenal nodule or mass. Kidneys unremarkable. No evidence for hydroureter. The urinary bladder appears normal for the degree of distention. Stomach/Bowel: Fundal diverticulum noted. Gastric fundus and body is distended with fluid. No visible NG tube on this exam. Areas of curvilinear gas are seen between  the dependent intraluminal gastric fluid in the wall of the stomach in the posterior and lateral portions of the fundus, concerning for pneumatosis although gas trapped between the gastric contents in the wall could potentially have this appearance. Distal stomach and duodenum are involved by edema/fluid/inflammation with associated luminal narrowing. Ampulla is prominent, bulging into the lumen of the duodenum. Transverse duodenum is more preserved. No small bowel wall thickening. No small bowel dilatation. The terminal ileum is normal. The appendix is normal. No gross colonic mass. No colonic wall thickening. Vascular/Lymphatic: No abdominal aortic aneurysm. No abdominal aortic atherosclerotic calcification. 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 The central splenic vein is also markedly attenuated at the confluence but splenic vein remains patent. Celiac axis, splenic artery, and SMA are patent without evidence for pseudoaneurysm. Scattered small abdominal lymph nodes evident. No pelvic sidewall lymphadenopathy. Reproductive: The prostate gland and seminal vesicles are unremarkable. Other: Scattered tiny foci of extraluminal gas are identified in the anterior abdomen. 1 collection is seen just deep to the right rectus musculature on axial 93/5. This was present previously and may be within the preperitoneal space as opposed to truly intraperitoneal in location although intraperitoneal gas is a possibility. Another collection is seen along the left rectus musculature but appears to be within the left rectus sheath (99/5). Both of these findings were present previously with some subcutaneous gas in the anterior abdominal wall on the previous CT presumably reflecting injection sites. Trace free fluid is seen in the pelvis. Musculoskeletal: No  worrisome lytic or sclerotic osseous abnormality. IMPRESSION: 1. Areas of curvilinear gas density are seen between the dependent intraluminal gastric fluid and the wall of the stomach in the posterior and lateral portions of the fundus, concerning for pneumatosis. The possibility that this represents a layer of gas trapped between the gastric contents and the wall is not entirely excluded, but gas is tracking along the dependent wall the stomach which would be somewhat unusual. 2. Marked dilatation of the main pancreatic duct measuring 12 mm diameter today compared to about 11 mm diameter previously. Pancreatic parenchyma enhances throughout. 3. The fluid collection identified on previous MRI between the pancreatic tail and lesser curvature of the stomach measures 5.7 x 2.0 cm today compared to 6.9 x 2.3 cm on the previous MRI. 4. Bilobed fluid collection anterior to the pancreatic head has increased from 2.3 x 1.3 cm previously. 5. 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 The central splenic vein is also markedly attenuated at the confluence but splenic vein remains patent. 6. Scattered tiny foci of extraluminal gas in the anterior abdomen. The volume of this trace gas appear stable to minimally decreased in the interval since prior CT. One collection is seen just deep to the right rectus musculature on axial 93/5. This was present on the previous CT and may be within the preperitoneal space as opposed to truly intraperitoneal in location although intraperitoneal gas is a possibility. Another collection is seen along the left rectus musculature but appears to be within the left rectus sheath. Both of these findings were present previously. 7. Trace free fluid in the pelvis. Electronically Signed: By: Camellia Candle M.D. On: 01/27/2024 05:28    Scheduled Meds:  aspirin  EC  81 mg Oral Daily   feeding supplement (PROSource TF20)  60 mL  Per Tube Daily   free water   75 mL Per Tube Q2H   loratadine   10 mg Oral Daily   metoprolol  succinate  12.5 mg Oral QHS   pantoprazole  (PROTONIX ) IV  40 mg Intravenous Q12H   sodium chloride  flush  3 mL Intravenous Q12H   Warfarin - Pharmacist Dosing Inpatient   Does not apply q1600   Continuous Infusions:  feeding supplement (OSMOLITE 1.5 CAL) 1,000 mL (01/27/24 1248)     LOS: 16 days   Time spent: 55 minutes  Camellia Door, DO  [facility]  01/27/2024, 4:45 PM

## 2024-01-27 NOTE — Assessment & Plan Note (Signed)
 Stable. No asthma flares.

## 2024-01-27 NOTE — Progress Notes (Signed)
 PHARMACY - ANTICOAGULATION CONSULT NOTE  Pharmacy Consult for Warfarin Indication: On-X mechanical AVR  Allergies  Allergen Reactions   Amoxicillin Hives    Patient Measurements: Height: 5' 11 (180.3 cm) Weight: 68.8 kg (151 lb 10.8 oz) IBW/kg (Calculated) : 75.3 HEPARIN  DW (KG): 71  Vital Signs: Temp: 98 F (36.7 C) (08/18 0357) Temp Source: Oral (08/18 0357) BP: 122/83 (08/18 0357) Pulse Rate: 74 (08/18 0357)  Labs: Recent Labs    01/25/24 0418 01/26/24 0355 01/27/24 0229  HGB 10.5*  --  10.6*  HCT 31.3*  --  31.7*  PLT 264  --  354  LABPROT 20.1* 23.4* 27.7*  INR 1.6* 2.0* 2.4*  CREATININE 0.70  --  0.86    Estimated Creatinine Clearance: 118.9 mL/min (by C-G formula based on SCr of 0.86 mg/dL).  Assessment: 33 yo M presenting with pancreatitis. Warfarin PTA regimen is 6 mg Monday and Thursday and 4.5 mg all other days (total weekly dose: 34.5 mg).  Of note, patient started rifaximin  8/9 which may decrease INR.   8/18 AM: Per [facility] note, no surgical intervention planned. Patient no longer on Tfs and there is consideration of TPN. INR above goal at 2.4 today following a held dose 8/17. Suspect increased INR responsiveness d/t low enteral intake. Hold dose again today.   Goal of Therapy:  INR 1.5-2.0 Monitor platelets by anticoagulation protocol: Yes   Plan:  Hold coumadin   Continue daily PT/INR.  Massie Fila, PharmD Clinical Pharmacist  01/27/2024 7:56 AM

## 2024-01-28 ENCOUNTER — Other Ambulatory Visit: Payer: Self-pay

## 2024-01-28 ENCOUNTER — Other Ambulatory Visit: Payer: Self-pay | Admitting: Internal Medicine

## 2024-01-28 DIAGNOSIS — I10 Essential (primary) hypertension: Secondary | ICD-10-CM | POA: Diagnosis not present

## 2024-01-28 DIAGNOSIS — K859 Acute pancreatitis without necrosis or infection, unspecified: Secondary | ICD-10-CM | POA: Diagnosis not present

## 2024-01-28 DIAGNOSIS — K863 Pseudocyst of pancreas: Secondary | ICD-10-CM | POA: Diagnosis not present

## 2024-01-28 DIAGNOSIS — K861 Other chronic pancreatitis: Secondary | ICD-10-CM | POA: Diagnosis not present

## 2024-01-28 DIAGNOSIS — E43 Unspecified severe protein-calorie malnutrition: Secondary | ICD-10-CM | POA: Diagnosis not present

## 2024-01-28 LAB — PROTIME-INR
INR: 2.1 — ABNORMAL HIGH (ref 0.8–1.2)
Prothrombin Time: 24.2 s — ABNORMAL HIGH (ref 11.4–15.2)

## 2024-01-28 LAB — GLUCOSE, CAPILLARY
Glucose-Capillary: 122 mg/dL — ABNORMAL HIGH (ref 70–99)
Glucose-Capillary: 106 mg/dL — ABNORMAL HIGH (ref 70–99)
Glucose-Capillary: 95 mg/dL (ref 70–99)

## 2024-01-28 MED ORDER — WARFARIN SODIUM 1 MG PO TABS
1.0000 mg | ORAL_TABLET | Freq: Once | ORAL | Status: AC
  Administered 2024-01-28: 1 mg via ORAL
  Filled 2024-01-28: qty 1

## 2024-01-28 MED ORDER — METOPROLOL TARTRATE 5 MG/5ML IV SOLN
2.5000 mg | Freq: Four times a day (QID) | INTRAVENOUS | Status: DC
  Administered 2024-01-28 – 2024-01-30 (×7): 2.5 mg via INTRAVENOUS
  Filled 2024-01-28 (×7): qty 5

## 2024-01-28 MED ORDER — ACETAMINOPHEN 10 MG/ML IV SOLN
1000.0000 mg | Freq: Four times a day (QID) | INTRAVENOUS | Status: AC | PRN
  Filled 2024-01-28: qty 100

## 2024-01-28 MED ORDER — LACTATED RINGERS IV SOLN: INTRAVENOUS | Status: AC

## 2024-01-28 NOTE — TOC Progression Note (Signed)
 Transition of Care [facility]) - Progression Note    Patient Details  Name: [name] [name] MRN: [id number] Date of Birth: [birth year]  Transition of Care [facility]) CM/SW Contact  [name] JONELLE Joe, RN Phone Number: 01/28/2024, 1:54 PM  Clinical Narrative:    CM spoke with attending team and they were updated that hospital leadership was recommending that patient not discharge home with Cortrak.  I called Holley Herring, RNCM with [employer] and she was updated that patient will likely need TPN.                     Expected Discharge Plan and Services                                               Social Drivers of Health (SDOH) Interventions SDOH Screenings   Food Insecurity: No Food Insecurity (01/11/2024)  Housing: Low Risk  (01/11/2024)  Transportation Needs: No Transportation Needs (01/11/2024)  Utilities: Not At Risk (01/11/2024)  Depression (PHQ2-9): Medium Risk (09/09/2023)  Financial Resource Strain: Low Risk  (04/25/2023)  Physical Activity: Sufficiently Active (04/25/2023)  Social Connections: Unknown (04/25/2023)  Stress: No Stress Concern Present (04/25/2023)  Tobacco Use: Low Risk  (01/20/2024)    Readmission Risk Interventions    01/15/2024   11:03 AM  Readmission Risk Prevention Plan  Post Dischage Appt Complete  Medication Screening Complete  Transportation Screening Complete

## 2024-01-28 NOTE — Progress Notes (Addendum)
 Daily Progress Note  DOA: 01/11/2024 Hospital Day: 59  Cc: acute on chronic pancreatitis with new pseudocyst  ASSESSMENT    Patient Profile: 33 y.o. year old male with a past medical history including but not necessarily limited to chronic pancreatitis, history of aortic valve replacement requiring chronic anticoagulation, SIBO.   Acute on chronic pancreatitis complicated by acute fluid collections / pseudocysts and more recent development of gastric pneumatosis.  Surgery is following.  No acute surgical intervention recommended at this time.  Surgery has recommended postpyloric feedings be held due to the presence of pneumatosis.  They discontinued octreotide  out of concern that it was exacerbating any underlying dysmotility which could lead to gastric distention and worsening pneumatosis. PEJ to be considered when enteral feeding can be safely resumed as this will be easier than a Cortrak for patient to management as outpatient.    Weight loss and malnutrition secondary to above  AVR, chronically anticoagulated with warfarin  SIBO Positive outpatient SIBO test, s/p course of rifaximin  here in the hospital    PLAN   Will need to discuss with Dr. Wilhelmenia whether ERCP with PD stenting still needed. He is scheduled for this next month   Subjective   Trying not to need IV pain meds.  Wants to manage pain with oral meds only.  Tolerating some popsicles.  Last bowel movement 2 days ago.     Objective   GI Studies:    Recent Labs    01/27/24 0229  WBC 7.6  HGB 10.6*  HCT 31.7*  MCV 87.3  PLT 354   No results for input(s): FOLATE, VITAMINB12, FERRITIN, TIBC, IRONPCTSAT in the last 72 hours. Recent Labs    01/27/24 0229  NA 138  K 4.0  CL 99  CO2 30  GLUCOSE 104*  BUN 10  CREATININE 0.86  CALCIUM  9.3   Recent Labs    01/27/24 0229  PROT 6.6  ALBUMIN  3.3*  AST 17  ALT 22  ALKPHOS 72  BILITOT 0.5      Imaging:  DG Abd Portable  1V CLINICAL DATA:  Feeding tube placement.  EXAM: PORTABLE ABDOMEN - 1 VIEW  COMPARISON:  01/21/2024  FINDINGS: Feeding tube tip is in the distal duodenum at the ligament of Treitz. Visualized upper abdomen demonstrates nonspecific bowel gas pattern.  IMPRESSION: Feeding tube tip is in the distal duodenum at the ligament of Treitz.  Electronically Signed   By: Camellia Candle M.D.   On: 01/27/2024 10:50 CT ABDOMEN PELVIS W WO CONTRAST Addendum: ADDENDUM REPORT: 01/27/2024 05:49   ADDENDUM:  I discussed the results of this study directly with Dr. Charlton by  telephone at approximately 0549 hours on 01/27/2024.   Electronically Signed    By: Camellia Candle M.D.    On: 01/27/2024 05:49 Narrative: CLINICAL DATA:  Pancreatitis.  EXAM: CT ABDOMEN AND PELVIS WITHOUT AND WITH CONTRAST  TECHNIQUE: Multidetector CT imaging of the abdomen and pelvis was performed following the standard protocol before and following the bolus administration of intravenous contrast.  RADIATION DOSE REDUCTION: This exam was performed according to the departmental dose-optimization program which includes automated exposure control, adjustment of the mA and/or kV according to patient size and/or use of iterative reconstruction technique.  CONTRAST:  OMNIPAQUE  IOHEXOL  350 MG/ML SOLN  COMPARISON:  01/23/2024 MRI.  01/22/2024 CT scan.  FINDINGS: Lower chest: No acute findings.  Hepatobiliary: No suspicious focal abnormality within the liver parenchyma. There is no evidence for gallstones, gallbladder wall  thickening, or pericholecystic fluid. No intrahepatic or extrahepatic biliary dilation.  Pancreas: Marked dilatation of the main pancreatic duct noted measuring 12 mm diameter today compared to about 11 mm diameter 1 measured again on the previous CT. Scattered calcification noted in the parenchyma of the body and tail the pancreas as before compatible with sequelae of chronic pancreatitis.  Bilobed 3.5 x 1.9 cm fluid collection anterior to the pancreatic head has increased from 2.3 x 1.3 cm previously. The fluid collection identified on previous MRI between the pancreatic tail and lesser curvature of the stomach measures 5.7 x 2.0 cm today compared to 6.9 x 2.3 cm on the previous MRI. There is thickening in the distal stomach and duodenum with perigastric compared duodenal fluid as before. Small fluid collection seen along the inferior capsule of the lateral segment left liver, similar to prior. Pancreatic parenchyma enhances throughout.  Spleen: No splenomegaly. No suspicious focal mass lesion.  Adrenals/Urinary Tract: No adrenal nodule or mass. Kidneys unremarkable. No evidence for hydroureter. The urinary bladder appears normal for the degree of distention.  Stomach/Bowel: Fundal diverticulum noted. Gastric fundus and body is distended with fluid. No visible NG tube on this exam. Areas of curvilinear gas are seen between the dependent intraluminal gastric fluid in the wall of the stomach in the posterior and lateral portions of the fundus, concerning for pneumatosis although gas trapped between the gastric contents in the wall could potentially have this appearance. Distal stomach and duodenum are involved by edema/fluid/inflammation with associated luminal narrowing. Ampulla is prominent, bulging into the lumen of the duodenum. Transverse duodenum is more preserved. No small bowel wall thickening. No small bowel dilatation. The terminal ileum is normal. The appendix is normal. No gross colonic mass. No colonic wall thickening.  Vascular/Lymphatic: No abdominal aortic aneurysm. No abdominal aortic atherosclerotic calcification. Portal vein is markedly attenuated in the region of the portal splenic confluence the does appear to remain patent although there is just a string sign of lumen visible as the SM V enters the portal venous anatomy. The central splenic vein  is also markedly attenuated at the confluence but splenic vein remains patent. Celiac axis, splenic artery, and SMA are patent without evidence for pseudoaneurysm. Scattered small abdominal lymph nodes evident. No pelvic sidewall lymphadenopathy.  Reproductive: The prostate gland and seminal vesicles are unremarkable.  Other: Scattered tiny foci of extraluminal gas are identified in the anterior abdomen. 1 collection is seen just deep to the right rectus musculature on axial 93/5. This was present previously and may be within the preperitoneal space as opposed to truly intraperitoneal in location although intraperitoneal gas is a possibility. Another collection is seen along the left rectus musculature but appears to be within the left rectus sheath (99/5). Both of these findings were present previously with some subcutaneous gas in the anterior abdominal wall on the previous CT presumably reflecting injection sites. Trace free fluid is seen in the pelvis.  Musculoskeletal: No worrisome lytic or sclerotic osseous abnormality.  IMPRESSION: 1. Areas of curvilinear gas density are seen between the dependent intraluminal gastric fluid and the wall of the stomach in the posterior and lateral portions of the fundus, concerning for pneumatosis. The possibility that this represents a layer of gas trapped between the gastric contents and the wall is not entirely excluded, but gas is tracking along the dependent wall the stomach which would be somewhat unusual. 2. Marked dilatation of the main pancreatic duct measuring 12 mm diameter today compared to about 11 mm diameter  previously. Pancreatic parenchyma enhances throughout. 3. The fluid collection identified on previous MRI between the pancreatic tail and lesser curvature of the stomach measures 5.7 x 2.0 cm today compared to 6.9 x 2.3 cm on the previous MRI. 4. Bilobed fluid collection anterior to the pancreatic head has increased from  2.3 x 1.3 cm previously. 5. 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 The central splenic vein is also markedly attenuated at the confluence but splenic vein remains patent. 6. Scattered tiny foci of extraluminal gas in the anterior abdomen. The volume of this trace gas appear stable to minimally decreased in the interval since prior CT. One collection is seen just deep to the right rectus musculature on axial 93/5. This was present on the previous CT and may be within the preperitoneal space as opposed to truly intraperitoneal in location although intraperitoneal gas is a possibility. Another collection is seen along the left rectus musculature but appears to be within the left rectus sheath. Both of these findings were present previously. 7. Trace free fluid in the pelvis.  Electronically Signed: By: Camellia Candle M.D. On: 01/27/2024 05:28     Scheduled inpatient medications:   aspirin  EC  81 mg Oral Daily   feeding supplement (PROSource TF20)  60 mL Per Tube Daily   free water   75 mL Per Tube Q2H   loratadine   10 mg Oral Daily   metoprolol  succinate  12.5 mg Oral QHS   pantoprazole  (PROTONIX ) IV  40 mg Intravenous Q12H   sodium chloride  flush  3 mL Intravenous Q12H   warfarin  1 mg Oral ONCE-1600   Warfarin - Pharmacist Dosing Inpatient   Does not apply q1600   Continuous inpatient infusions:   feeding supplement (OSMOLITE 1.5 CAL) 1,000 mL (01/27/24 1248)   PRN inpatient medications: albuterol , alum & mag hydroxide-simeth, hydrALAZINE , hydrOXYzine , ondansetron  **OR** ondansetron  (ZOFRAN ) IV, oxyCODONE -acetaminophen , phenol  Vital signs in last 24 hours: Temp:  [97.3 F (36.3 C)-98.4 F (36.9 C)] 97.9 F (36.6 C) (08/19 1150) Pulse Rate:  [56-72] 72 (08/19 1150) Resp:  [15-20] 17 (08/19 1150) BP: (109-125)/(73-91) 125/91 (08/19 1150) SpO2:  [99 %-100 %] 100 % (08/19 1150) Last BM Date :  01/24/24  Intake/Output Summary (Last 24 hours) at 01/28/2024 1202 Last data filed at 01/28/2024 0916 Gross per 24 hour  Intake 900 ml  Output --  Net 900 ml    Intake/Output from previous day: 08/18 0701 - 08/19 0700 In: 750 [NG/GT:750] Out: -  Intake/Output this shift: Total I/O In: 150 [NG/GT:150] Out: -    Physical Exam:  General: Alert male in NAD Heart:  Regular rate and rhythm.  Pulmonary: Normal respiratory effort Abdomen: Soft, nondistended, nontender. Normal bowel sounds. Extremities: No lower extremity edema  Neurologic: Alert and oriented Psych: Pleasant. Cooperative   LOS: 17 days   Vina Dasen ,NP 01/28/2024, 12:02 PM  I personally saw the patient and performed a substantive portion of this encounter (>50% time spent), including a complete performance of at least one of the key components (MDM, Hx and/or Exam), in conjunction with the APP.  I agree with the APP's note, impression, and recommendations with additional input as follows.   Discussed options for nutrition with the patient today.  Unfortunately cor track with tube feeding will not be possible as an outpatient due to difficulty with logistics and concern from the hospitalist team about aspiration issues.  Thus remaining options include  J-tube versus TPN.  G-tube would not be possible in the setting of gastric pneumatosis.  Per initial discussion, patient was okay with J-tube placement since enteric feeding is preferred in the setting of pancreatitis.  However surgery is recommending that all enteral tube feeds be held for now due to ongoing concern for gastric pneumatosis.  Surgery also recommended NG tube placement for gastric decompression, but patient was not able to tolerate NG tube placement today.  Surgery discussed with the patient and they are opting to hold off on NG tube placement in general at this time.  Thus I proceeded to discuss the idea of TPN with the patient.  I did tell the patient that in  the setting of pancreatitis there is an increased risk for mortality and infection with TPN compared to enteral feeding, but due to his concurrent gastric pneumatosis, TPN would be a way to be able to get him nutrition. Patient stated that he would prefer anything that would get him out of the hospital faster. I did also discuss the idea of electrolyte abnormalities and the rare possibility of causing pancreatitis with TPN (such as due to hypercalcemia), but again I think we have limited options at this time.  It is unclear when surgery would be comfortable with advancing the patient's tube feeds again or when they would be able to place a J-tube (though per the surgery team they are open to doing a J-tube for him in the future). Our goal would be to have him get some nutrition with TPN for now and then eventually get a J-tube placed for enteral nutrition.  Patient is agreeable to this plan.  Hospitalist and surgery teams were notified, and TPN will be initiated.  Surgery will continue to follow. We did also discuss this case with our biliary specialist Dr. Wilhelmenia who recommends against any endoscopic intervention at this time due to gastric pneumatosis and immature wall of the pancreas fluid collection.  Estefana Kidney, MD

## 2024-01-28 NOTE — Progress Notes (Signed)
 PICC consult received. Pt will be assessed on 8/20 for PICC insertion.

## 2024-01-28 NOTE — Progress Notes (Signed)
 PHARMACY - ANTICOAGULATION CONSULT NOTE  Pharmacy Consult for Warfarin Indication: On-X mechanical AVR  Allergies  Allergen Reactions   Amoxicillin Hives    Patient Measurements: Height: 5' 11 (180.3 cm) Weight: 68.8 kg (151 lb 10.8 oz) IBW/kg (Calculated) : 75.3 HEPARIN  DW (KG): 71  Vital Signs: Temp: 97.6 F (36.4 C) (08/19 0552) Temp Source: Oral (08/19 0552) BP: 109/73 (08/19 0552) Pulse Rate: 56 (08/19 0552)  Labs: Recent Labs    01/26/24 0355 01/27/24 0229 01/28/24 0413  HGB  --  10.6*  --   HCT  --  31.7*  --   PLT  --  354  --   LABPROT 23.4* 27.7* 24.2*  INR 2.0* 2.4* 2.1*  CREATININE  --  0.86  --     Estimated Creatinine Clearance: 118.9 mL/min (by C-G formula based on SCr of 0.86 mg/dL).  Assessment: 33 yo M presenting with pancreatitis. Warfarin PTA regimen is 6 mg Monday and Thursday and 4.5 mg all other days (total weekly dose: 34.5 mg).  Of note, patient started rifaximin  8/9 which may decrease INR.   8/18 AM: Per [facility] note, no surgical intervention planned. Patient had Tfs resumed yesterday. INR dropped 2.4>>2.1 overnight- remains above goal. To prevent further drop in INR, will provide patient with reduced dose.   Goal of Therapy:  INR 1.5-2.0 Monitor platelets by anticoagulation protocol: Yes   Plan:  Warfarin 1 mg PO x1  Continue daily PT/INR.  Massie Fila, PharmD Clinical Pharmacist  01/28/2024 8:01 AM

## 2024-01-28 NOTE — Progress Notes (Signed)
 Progress Note     Subjective: Patient reports continued epigastric pain. He feels that this is manageable at this time. He reports some nausea overnight, but zofran  helped. Denies vomiting. Denies nausea currently. Reports that his last bowel movement was around 8/15. Reports flatulence  ROS  All negative with the exception of above.  Objective: Vital signs in last 24 hours: Temp:  [97.3 F (36.3 C)-98.4 F (36.9 C)] 97.6 F (36.4 C) (08/19 0552) Pulse Rate:  [56-65] 56 (08/19 0552) Resp:  [15-20] 18 (08/19 0552) BP: (109-125)/(73-84) 109/73 (08/19 0552) SpO2:  [99 %-100 %] 99 % (08/19 0552) Last BM Date : 01/24/24  Intake/Output from previous day: 08/18 0701 - 08/19 0700 In: 750 [NG/GT:750] Out: -  Intake/Output this shift: Total I/O In: 75 [NG/GT:75] Out: -   PE: General: Pleasant male who is laying in bed in NAD. HEENT: Cortrak in place Lungs: Respiratory effort nonlabored Abd: Soft and ND. Mild tenderness to palpation of epigastric region. No rebound tenderness or guarding. Psych: A&Ox3 with an appropriate affect.    Lab Results:  Recent Labs    01/27/24 0229  WBC 7.6  HGB 10.6*  HCT 31.7*  PLT 354   BMET Recent Labs    01/27/24 0229  NA 138  K 4.0  CL 99  CO2 30  GLUCOSE 104*  BUN 10  CREATININE 0.86  CALCIUM  9.3   PT/INR Recent Labs    01/27/24 0229 01/28/24 0413  LABPROT 27.7* 24.2*  INR 2.4* 2.1*   CMP     Component Value Date/Time   NA 138 01/27/2024 0229   NA 142 08/30/2021 1430   K 4.0 01/27/2024 0229   CL 99 01/27/2024 0229   CO2 30 01/27/2024 0229   GLUCOSE 104 (H) 01/27/2024 0229   BUN 10 01/27/2024 0229   BUN 13 08/30/2021 1430   CREATININE 0.86 01/27/2024 0229   CALCIUM  9.3 01/27/2024 0229   PROT 6.6 01/27/2024 0229   ALBUMIN  3.3 (L) 01/27/2024 0229   AST 17 01/27/2024 0229   ALT 22 01/27/2024 0229   ALKPHOS 72 01/27/2024 0229   BILITOT 0.5 01/27/2024 0229   GFRNONAA >60 01/27/2024 0229   Lipase      Component Value Date/Time   LIPASE 344 (H) 01/27/2024 0229       Studies/Results: DG Abd Portable 1V Result Date: 01/27/2024 CLINICAL DATA:  Feeding tube placement. EXAM: PORTABLE ABDOMEN - 1 VIEW COMPARISON:  01/21/2024 FINDINGS: Feeding tube tip is in the distal duodenum at the ligament of Treitz. Visualized upper abdomen demonstrates nonspecific bowel gas pattern. IMPRESSION: Feeding tube tip is in the distal duodenum at the ligament of Treitz. Electronically Signed   By: Camellia Candle M.D.   On: 01/27/2024 10:50   CT ABDOMEN PELVIS W WO CONTRAST Addendum Date: 01/27/2024 ADDENDUM REPORT: 01/27/2024 05:49 ADDENDUM: I discussed the results of this study directly with Dr. Charlton by telephone at approximately 0549 hours on 01/27/2024. Electronically Signed   By: Camellia Candle M.D.   On: 01/27/2024 05:49   Result Date: 01/27/2024 CLINICAL DATA:  Pancreatitis. EXAM: CT ABDOMEN AND PELVIS WITHOUT AND WITH CONTRAST TECHNIQUE: Multidetector CT imaging of the abdomen and pelvis was performed following the standard protocol before and following the bolus administration of intravenous contrast. RADIATION DOSE REDUCTION: This exam was performed according to the departmental dose-optimization program which includes automated exposure control, adjustment of the mA and/or kV according to patient size and/or use of iterative reconstruction technique. CONTRAST:  100mL OMNIPAQUE  IOHEXOL   350 MG/ML SOLN COMPARISON:  01/23/2024 MRI.  01/22/2024 CT scan. FINDINGS: Lower chest: No acute findings. Hepatobiliary: No suspicious focal abnormality within the liver parenchyma. There is no evidence for gallstones, gallbladder wall thickening, or pericholecystic fluid. No intrahepatic or extrahepatic biliary dilation. Pancreas: Marked dilatation of the main pancreatic duct noted measuring 12 mm diameter today compared to about 11 mm diameter 1 measured again on the previous CT. Scattered calcification noted in the parenchyma of  the body and tail the pancreas as before compatible with sequelae of chronic pancreatitis. Bilobed 3.5 x 1.9 cm fluid collection anterior to the pancreatic head has increased from 2.3 x 1.3 cm previously. The fluid collection identified on previous MRI between the pancreatic tail and lesser curvature of the stomach measures 5.7 x 2.0 cm today compared to 6.9 x 2.3 cm on the previous MRI. There is thickening in the distal stomach and duodenum with perigastric compared duodenal fluid as before. Small fluid collection seen along the inferior capsule of the lateral segment left liver, similar to prior. Pancreatic parenchyma enhances throughout. Spleen: No splenomegaly. No suspicious focal mass lesion. Adrenals/Urinary Tract: No adrenal nodule or mass. Kidneys unremarkable. No evidence for hydroureter. The urinary bladder appears normal for the degree of distention. Stomach/Bowel: Fundal diverticulum noted. Gastric fundus and body is distended with fluid. No visible NG tube on this exam. Areas of curvilinear gas are seen between the dependent intraluminal gastric fluid in the wall of the stomach in the posterior and lateral portions of the fundus, concerning for pneumatosis although gas trapped between the gastric contents in the wall could potentially have this appearance. Distal stomach and duodenum are involved by edema/fluid/inflammation with associated luminal narrowing. Ampulla is prominent, bulging into the lumen of the duodenum. Transverse duodenum is more preserved. No small bowel wall thickening. No small bowel dilatation. The terminal ileum is normal. The appendix is normal. No gross colonic mass. No colonic wall thickening. Vascular/Lymphatic: No abdominal aortic aneurysm. No abdominal aortic atherosclerotic calcification. 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 The central splenic vein is also markedly attenuated at the confluence but splenic vein remains patent. Celiac axis, splenic artery, and SMA are patent without evidence for pseudoaneurysm. Scattered small abdominal lymph nodes evident. No pelvic sidewall lymphadenopathy. Reproductive: The prostate gland and seminal vesicles are unremarkable. Other: Scattered tiny foci of extraluminal gas are identified in the anterior abdomen. 1 collection is seen just deep to the right rectus musculature on axial 93/5. This was present previously and may be within the preperitoneal space as opposed to truly intraperitoneal in location although intraperitoneal gas is a possibility. Another collection is seen along the left rectus musculature but appears to be within the left rectus sheath (99/5). Both of these findings were present previously with some subcutaneous gas in the anterior abdominal wall on the previous CT presumably reflecting injection sites. Trace free fluid is seen in the pelvis. Musculoskeletal: No worrisome lytic or sclerotic osseous abnormality. IMPRESSION: 1. Areas of curvilinear gas density are seen between the dependent intraluminal gastric fluid and the wall of the stomach in the posterior and lateral portions of the fundus, concerning for pneumatosis. The possibility that this represents a layer of gas trapped between the gastric contents and the wall is not entirely excluded, but gas is tracking along the dependent wall the  stomach which would be somewhat unusual. 2. Marked dilatation of the main pancreatic duct measuring 12 mm diameter today compared to about 11 mm diameter previously. Pancreatic parenchyma enhances throughout. 3. The fluid collection identified on previous MRI between the pancreatic tail and lesser curvature of the stomach measures 5.7 x 2.0 cm today compared to 6.9 x 2.3 cm on the previous MRI. 4. Bilobed fluid collection anterior to the pancreatic head has  increased from 2.3 x 1.3 cm previously. 5. 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 The central splenic vein is also markedly attenuated at the confluence but splenic vein remains patent. 6. Scattered tiny foci of extraluminal gas in the anterior abdomen. The volume of this trace gas appear stable to minimally decreased in the interval since prior CT. One collection is seen just deep to the right rectus musculature on axial 93/5. This was present on the previous CT and may be within the preperitoneal space as opposed to truly intraperitoneal in location although intraperitoneal gas is a possibility. Another collection is seen along the left rectus musculature but appears to be within the left rectus sheath. Both of these findings were present previously. 7. Trace free fluid in the pelvis. Electronically Signed: By: Camellia Candle M.D. On: 01/27/2024 05:28    Anti-infectives: Anti-infectives (From admission, onward)    Start     Dose/Rate Route Frequency Ordered Stop   01/18/24 1230  rifaximin  (XIFAXAN ) tablet 550 mg  Status:  Discontinued        550 mg Oral 3 times daily 01/18/24 1132 01/26/24 1323        Assessment/Plan Acute on chronic pancreatitis Calcifications of head of the pancreas Pancreatic pseudocyst Gastric pneumatosis -MRCP completed 8/14 -Most recent imaging includes: CT from 8/18 showed gastric pneumatosis. Additionally dilation of pancreatic duct noted measuring 12 mm diameter. The fluid collection identified on previous MRI between the pancreatic tail and lesser curvature of the stomach measured 5.7 x 2.0 cm. Bilobed fluid collection anterior to the pancreatic head increased. Scattered tiny foci of extraluminal gas in the anterior abdomen. The volume of this trace gas appear stable to minimally decreased in the interval since prior CT. One collection is seen just deep to the right rectus musculature on  axial 93/5. This was present on the previous CT and may be within the preperitoneal space as opposed to truly intraperitoneal in location although intraperitoneal gas is a possibility. Another collection is seen along the left rectus musculature but appears to be within the left rectus sheath. Both of these findings were present previously. Trace free fluid in the pelvis. -DG Abd Portable 1V on 8/18 showing feeding tube tip is in the distal duodenum at the ligament of Treitz. -Octreotide  was discontinued 8/18. -Afebrile. Mild hypertension. -No acute surgical intervention recommended at this time. Nutrition needing to be optimized.  -Will hold TF's and place an NGT to LIWS for now -Will continue to follow   FEN: NPO; Holding Tfs; NGT to be placed VTE: Coumadin  ID: None currently    LOS: 17 days   I reviewed specialist notes, hospitalist notes, last 24 h vitals and pain scores, last 48 h intake and output, last 24 h labs and trends, and last 24 h imaging results.  This care required moderate level of medical decision making.    Marjorie Carlyon Favre, [facility] 01/28/2024, 9:08 AM Please see Amion for pager number during day hours  7:00am-4:30pm

## 2024-01-28 NOTE — Progress Notes (Addendum)
 PROGRESS NOTE    [name] [name]  FMW:[id number] DOB: 10-Mar-1991 DOA: 01/11/2024 PCP: Lucius Krabbe, NP  Subjective: Pt seen and examined. Tolerated tube feeds last night. Up to rate of 60 ml/hr of Osmolite 1.5. pt works at [employer]. He is pretty sure that he will not be allowed to work with Cortrak in his nose.  Pain is well controlled with 10 mg percocet q4h prn. Pt states pain control lasting about 5 hours. IV dilaudid  was stopped yesterday.   Hospital Course: 33 y.o. male with medical history significant of hypertension, bicuspid aortic valve with severe aortic insufficiency and moderate aortic stenosis s/p mechanical AV replacement on 09/27/2021 on chronic anticoagulation, recurrent pancreatitis, asthma, GERD, and prior history of alcohol abuse presents with abdominal pain found to have AKI pancreatitis with pseudocyst, GI was consulted and admitted for conservative management. Patient had multiple hospitalization for pancreatitis. Patient with decreased tolerance to diet and w/ severe malnutrition> cortrak placed post pyloric on 01/15/24, diet orally was slowly advanced  8/13.  Patient with dilation of the pancreatic duct with calcifications consistent with chronic pancreatitis, increase in the multi loculated fluid collection, decreased ascites 8/14.  Case discussed with gastroenterology.  MRCP of the abdomen ordered.  Okay to start tube feeding after MRCP done. 8/15.  Lipase up to 451.  MRCP showing diffuse inflammatory fat stranding about the pancreas consistent with acute pancreatitis.  Severe ductal dilation consistent with chronic stigmata of pancreatitis.  Interval development of a fluid collection 6.9 x 2.3 x 3.0 cm and a prior fluid collection 3.0 x 2.1 x 3.0 cm.  Started on octreotide  by gastroenterology 8/16.  Cortrak tube came out last night.  Case discussed with gastroenterology and will watch without tube at this point.  Keep on full liquid diet.  Lipase down to  142. 8/17.  Continue full liquid diet.   CC: abd pain HPI: [name] [name] is a 33 y.o. male with medical history significant of hypertension, bicuspid aortic valve with severe aortic insufficiency and moderate aortic stenosis s/p mechanical aortic valve replacement in arthroplasty on 09/27/2021 on chronic anticoagulation, recurrent pancreatitis, asthma, GERD, and prior history of alcohol abuse presents with abdominal pain.   The patient has been experiencing abdominal pain since around 9 PM last night, describing it as more intense than usual and primarily located in the left upper quadrant, with increased pain on the right side as well. The pain is exacerbated by movement. He has nausea, but has not vomited.   He has a history of pancreatic pseudocyst, which was previously evaluated with an endoscopy in April. A follow-up endoscopy was advised six months later.  At that time he reported also having at least one pancreatic stone in a duct. He was just recently hospitalized in June for reoccurrence of pancreatitis and was treated with IV fluids and pain medication. He has experienced flare-ups that did not require hospitalization, with the most recent one occurring a week and a half ago.   He has a history of heart surgery with valve replacement and is on anticoagulation therapy with an INR goal of 1.5 to 2.   He has not consumed alcohol in the past ten months. He reports having loose stools and mild gas but no fever or chills. He has been off Truvada for about a week and a half due to stomach upset. Current medications include Creon , which he takes with meals, and metoprolol .   In the ED patient was noted to be mildly bradycardic  with all vital signs otherwise maintained.  Labs significant for lipase 338, WBC 12.3, and hemoglobin 12.7.  CT scan of the abdomen pelvis noted severe sequelae of advanced pancreatitis with pronounced and confluent ongoing inflammation of the distal stomach  the proximal duodenum and adjacent hepatic flexure with a 3 cm pancreatic pseudocyst and 5 cm pancreatic complex cyst or hematoma with regression since June.  Patient was given 1 L bolus of lactated Ringer 's, morphine , and Zofran .  Significant Events: Admitted 01/11/2024 for recurrent pancreatitis with pseudocyst 01-11-2024 GI consulted due to recurrent pancreatitis with pseudocyst formation 01-11-2024 through 01-21-2024 pt spent several days NPO, cortrak placed on 01-15-2024 and he was started on post-pylori feedings. 8/13.  Patient with dilation of the pancreatic duct with calcifications consistent with chronic pancreatitis, increase in the multi loculated fluid collection, decreased ascites 8/14.  Case discussed with gastroenterology.  MRCP of the abdomen ordered.  Okay to start tube feeding after MRCP done. 8/15.  Lipase up to 451.  MRCP showing diffuse inflammatory fat stranding about the pancreas consistent with acute pancreatitis.  Severe ductal dilation consistent with chronic stigmata of pancreatitis.  Interval development of a fluid collection 6.9 x 2.3 x 3.0 cm and a prior fluid collection 3.0 x 2.1 x 3.0 cm.  Started on octreotide  by gastroenterology 8/16.  Cortrak tube came out last night.  Case discussed with gastroenterology and will watch without tube at this point.  Keep on full liquid diet.  Lipase down to 142. 8/17.  Continue full liquid diet. 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 Pt had been on octreotide  started by GI since 01-24-2024. Surgery stopped Octreotide .  Admission Labs: INR 1.0 Lipase 338 Na 138, K 4.0, CO2 of 25, BUN 9, Scr 0.77, glu 132 WBC 12.3, HgB 12.7, plt 302 CRP < 0.5  Admission Imaging Studies: CT abd/pelvis Severe sequelae of advanced Pancreatitis, with pronounced and confluent ongoing inflammation of the distal stomach, the proximal duodenum, and the adjacent hepatic flexure of colon. But 3 cm pancreatic  pseudocyst and 5 cm peripancreatic complex cyst or hematoma have both regressed since June. Small volume ascites in the right upper quadrant, the right gutter, and the pelvis not significantly changed. And stable narrowing of the SMV which remains patent. No new or progressed complicating features identified since June. 2. Chronic sternotomy.  Minor lung base atelectasis.  Significant Labs: 01-17-2024 prealbumin 22 01-23-2024 CA 19-9 was 8 01-23-2024 CEA <0.6  Significant Imaging Studies: 01-22-2024 CT abd/pelvis Dilation of the pancreatic duct and coarse calcifications in the pancreatic head consistent with stigmata of chronic pancreatitis, similar to prior. Normal pancreatic enhancement. 2. Interval increase in size of a multiloculated fluid collection with rim enhancement anterior to the pancreatic neck extending to posterior wall of the gastric antrum , most consistent with peripancreatic abscess formation. Gastric wall connection cannot be excluded. Recommend future follow-ups with dedicated MRCP with contrast if clinically warranted. 3.  Decreased ascites and perigastric/perihepatic free fluid. 4. Gastrojejunostomy tube terminates in proximal jejunum. Normal gastric size. 01-23-2024 MRCP Diffuse inflammatory fat stranding about the pancreas, consistent with acute pancreatitis. 2. Severe pancreatic ductal dilatation measuring up to 1.0 cm in caliber in the pancreatic neck, the pancreatic duct effaced within the central pancreatic head. 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 3. Since earlier same day examination, interval development of an elongated acute pancreatic fluid collection superior to the pancreatic body and tail and adjacent to the lesser curvature of the stomach, measuring 6.9  x 2.3 x 3.0 cm. 4. Unchanged heterogeneous fluid collection ventral to the pancreatic head, involving the severely inflamed gastric antrum and pylorus measuring 3.0 x 2.1 x 3.0 cm, consistent  with acute pancreatic fluid collection and concerning for ulceration to the bowel lumen given size, complexity, and significant involvement of the bowel wall. 5. Presence or absence of infection within the above described fluid collections is not established by imaging. 6. Small volume ascites. 01-27-2024 CT abd/pelvis Areas of curvilinear gas density are seen between the dependent intraluminal gastric fluid and the wall of the stomach in the posterior and lateral portions of the fundus, concerning for pneumatosis. The possibility that this represents a layer of gas trapped between the gastric contents and the wall is not entirely excluded, but gas is tracking along the dependent wall the stomach which would be somewhat unusual. 2. Marked dilatation of the main pancreatic duct measuring 12 mm diameter today compared to about 11 mm diameter previously. Pancreatic parenchyma enhances throughout. 3. The fluid collection identified on previous MRI between the pancreatic tail and lesser curvature of the stomach measures 5.7 x 2.0 cm today compared to 6.9 x 2.3 cm on the previous MRI. 4. Bilobed fluid collection anterior to the pancreatic head has increased from 2.3 x 1.3 cm previously. 5. 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 The central splenic vein is also markedly attenuated at the confluence but splenic vein remains patent. 6. Scattered tiny foci of extraluminal gas in the anterior abdomen. The volume of this trace gas appear stable to minimally decreased in the interval since prior CT. One collection is seen just deep to the right rectus musculature on axial 93/5. This was present on the previous CT and may be within the preperitoneal space as opposed to truly intraperitoneal in location although intraperitoneal gas is a possibility. Another collection is seen along the left rectus musculature but appears to be within the left rectus  sheath. Both of these findings were present previously. 7. Trace free fluid in the pelvis.  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  Continued abdominal pain.  CT scan showed very dilated pancreatic duct.  MRCP showing 2 fluid collections and a dilated pancreatic duct and findings consistent with acute pancreatitis.  Octreotide  started by gastroenterology.  Cortrak tube came out on 8/15 with an episode of vomiting.  Spoke with gastroenterology and they will hold off on replacing tube at this point.  Continue to full liquid diet.  Gastroenterology ordered a repeat CT scan for tomorrow.  01-27-2024 CT abd/pelvis shows gastric pneumatosis. General surgery consulted. They have stopped octreotide . Pt had Cortrak replaced. GI recommending pt go home with Cortrak and home [place] tube feeds. I have stopped IV dilaudid . Pt understand he cannot go home with IV opiates. Have increased his percocet to 10 mg q4h prn pain. Will adjust po pain meds as needed.  01-28-2024 pt tolerating Cortrak Jejunal feeds at 60 ml/hr. I discussed my concerns with overnight  feeds with a Cortrak tube. Pt states he remember waking up vomiting in the middle of the night and it was not fun. Discussed that have a PEJ would not prevent aspiration from vomiting. Similar risk with Cortrak jejunal feeds.  However the biggest difference is that displace PEJ tube would end up in the stomach. A displaced Cortrak tube could potentially infuse enteral feeds right above his trachea. Discussed that I would not  condone overnight feeding via Cortrak. Pt could have daytime feedings via Cortrak.  GI to discuss PEJ vs TPN with patient. GI said pt could have clear liquid diet.  Pain is well controlled with 10 mg percocet q4h prn. Pt states pain control lasting about 5 hours. IV dilaudid  was stopped yesterday.   Pseudocyst of pancreas 01-11-2024 through 01-26-2024 Case discussed with gastroenterology since he is not having a fever, he does not believe that this is an abscess.  Continue to hold off on antibiotics.  01-28-2024 GI wants to wait 4-6 weeks and repeat imaging. Potential for endoscopic intervention if pseudocyst matures.  Protein-calorie malnutrition, severe Nutrition Status: Nutrition Problem: Severe Malnutrition Etiology: vomiting, chronic illness, altered GI function Signs/Symptoms: moderate fat depletion, severe muscle depletion Interventions: Refer to RD note for recommendations    History of alcohol abuse Remission for 10 months  History of asthma Stable. No asthma flares.  On pre-exposure prophylaxis for HIV Pt has not had any PREP in several weeks. He can restart this at home if needed.  Essential hypertension 01-28-2024 Continue Toprol -XL 12.5 mg daily. BP stable.  S/P AVR (aortic valve replacement) and aortoplasty 01-28-2024 Pt has mechanical aortic valve and requires life long Coumadin  therapy. INR 2.1 today. Goal INR 2-3  Small intestinal bacterial overgrowth (SIBO)-resolved as of 01/28/2024 01-11-2024 through 01-26-2024 pt completed rifaximin  on 01-26-2024. He was treated with 14 days of therapy.  DVT prophylaxis:  warfarin (COUMADIN ) tablet 1 mg     Code Status: Full Code Family Communication: pt is decisional. No family at bedside Disposition Plan: home Reason for continuing need for hospitalization: on enteral feeds. Awaiting for GI to decide on if pt needs PEJ vs TPN.  Objective: Vitals:   01/27/24 1957 01/28/24 0106 01/28/24 0552 01/28/24 0921  BP: 123/81  125/84 109/73 119/78  Pulse: 65 (!) 59 (!) 56 63  Resp: 18 18 18 17   Temp: 97.6 F (36.4 C) (!) 97.3 F (36.3 C) 97.6 F (36.4 C) (!) 97.5 F (36.4 C)  TempSrc:  Oral Oral Axillary  SpO2: 100% 100% 99% 99%  Weight:      Height:        Intake/Output Summary (Last 24 hours) at 01/28/2024 1037 Last data filed at 01/28/2024 0916 Gross per 24 hour  Intake 900 ml  Output --  Net 900 ml   Filed Weights   01/21/24 0815 01/23/24 0500 01/27/24 0500  Weight: 66 kg 66 kg 68.8 kg    Examination:  Physical Exam Vitals and nursing note reviewed.  Constitutional:      General: He is not in acute distress.    Appearance: He is not toxic-appearing or diaphoretic.  HENT:     Head: Normocephalic and atraumatic.  Eyes:     General: No scleral icterus. Neck:     Comments: +cortrak in place in nare Cardiovascular:     Rate and Rhythm: Normal rate and regular rhythm.     Heart sounds: Murmur heard.     Comments: +mechanical aortic click Pulmonary:     Effort: Pulmonary effort  is normal. No respiratory distress.  Abdominal:     General: Abdomen is flat. Bowel sounds are normal. There is no distension.     Palpations: Abdomen is soft.  Musculoskeletal:     Right lower leg: No edema.     Left lower leg: No edema.  Skin:    General: Skin is warm and dry.     Capillary Refill: Capillary refill takes less than 2 seconds.  Neurological:     General: No focal deficit present.     Mental Status: He is alert and oriented to person, place, and time.     Data Reviewed: I have personally reviewed following labs and imaging studies  CBC: Recent Labs  Lab 01/22/24 0222 01/25/24 0418 01/27/24 0229  WBC 7.2 6.2 7.6  NEUTROABS 3.3 3.3  --   HGB 11.4* 10.5* 10.6*  HCT 34.0* 31.3* 31.7*  MCV 87.0 86.5 87.3  PLT 338 264 354   Basic Metabolic Panel: Recent Labs  Lab 01/22/24 0222 01/25/24 0418 01/27/24 0229  NA 138 138 138  K 4.0 3.9 4.0  CL 101 99 99  CO2 28 29 30   GLUCOSE 102*  104* 104*  BUN 16 11 10   CREATININE 0.74 0.70 0.86  CALCIUM  9.5 8.8* 9.3   GFR: Estimated Creatinine Clearance: 118.9 mL/min (by C-G formula based on SCr of 0.86 mg/dL). Liver Function Tests: Recent Labs  Lab 01/22/24 0222 01/25/24 0418 01/27/24 0229  AST 25 21 17   ALT 26 26 22   ALKPHOS 66 68 72  BILITOT 0.2 0.4 0.5  PROT 7.0 6.7 6.6  ALBUMIN  3.6 3.4* 3.3*   Recent Labs  Lab 01/22/24 0222 01/24/24 0256 01/25/24 0418 01/27/24 0229  LIPASE 155* 451* 142* 344*   Coagulation Profile: Recent Labs  Lab 01/24/24 0256 01/25/24 0418 01/26/24 0355 01/27/24 0229 01/28/24 0413  INR 2.5* 1.6* 2.0* 2.4* 2.1*   CBG: Recent Labs  Lab 01/27/24 0101 01/27/24 0359 01/27/24 0809 01/27/24 1210 01/27/24 1727  GLUCAP 94 87 98 96 139*   Radiology Studies: DG Abd Portable 1V Result Date: 01/27/2024 CLINICAL DATA:  Feeding tube placement. EXAM: PORTABLE ABDOMEN - 1 VIEW COMPARISON:  01/21/2024 FINDINGS: Feeding tube tip is in the distal duodenum at the ligament of Treitz. Visualized upper abdomen demonstrates nonspecific bowel gas pattern. IMPRESSION: Feeding tube tip is in the distal duodenum at the ligament of Treitz. Electronically Signed   By: Camellia Candle M.D.   On: 01/27/2024 10:50   CT ABDOMEN PELVIS W WO CONTRAST Addendum Date: 01/27/2024 ADDENDUM REPORT: 01/27/2024 05:49 ADDENDUM: I discussed the results of this study directly with Dr. Charlton by telephone at approximately 0549 hours on 01/27/2024. Electronically Signed   By: Camellia Candle M.D.   On: 01/27/2024 05:49   Result Date: 01/27/2024 CLINICAL DATA:  Pancreatitis. EXAM: CT ABDOMEN AND PELVIS WITHOUT AND WITH CONTRAST TECHNIQUE: Multidetector CT imaging of the abdomen and pelvis was performed following the standard protocol before and following the bolus administration of intravenous contrast. RADIATION DOSE REDUCTION: This exam was performed according to the departmental dose-optimization program which includes automated  exposure control, adjustment of the mA and/or kV according to patient size and/or use of iterative reconstruction technique. CONTRAST:  OMNIPAQUE  IOHEXOL  350 MG/ML SOLN COMPARISON:  01/23/2024 MRI.  01/22/2024 CT scan. FINDINGS: Lower chest: No acute findings. Hepatobiliary: No suspicious focal abnormality within the liver parenchyma. There is no evidence for gallstones, gallbladder wall thickening, or pericholecystic fluid. No intrahepatic or extrahepatic biliary dilation. Pancreas: Marked dilatation  of the main pancreatic duct noted measuring 12 mm diameter today compared to about 11 mm diameter 1 measured again on the previous CT. Scattered calcification noted in the parenchyma of the body and tail the pancreas as before compatible with sequelae of chronic pancreatitis. Bilobed 3.5 x 1.9 cm fluid collection anterior to the pancreatic head has increased from 2.3 x 1.3 cm previously. The fluid collection identified on previous MRI between the pancreatic tail and lesser curvature of the stomach measures 5.7 x 2.0 cm today compared to 6.9 x 2.3 cm on the previous MRI. There is thickening in the distal stomach and duodenum with perigastric compared duodenal fluid as before. Small fluid collection seen along the inferior capsule of the lateral segment left liver, similar to prior. Pancreatic parenchyma enhances throughout. Spleen: No splenomegaly. No suspicious focal mass lesion. Adrenals/Urinary Tract: No adrenal nodule or mass. Kidneys unremarkable. No evidence for hydroureter. The urinary bladder appears normal for the degree of distention. Stomach/Bowel: Fundal diverticulum noted. Gastric fundus and body is distended with fluid. No visible NG tube on this exam. Areas of curvilinear gas are seen between the dependent intraluminal gastric fluid in the wall of the stomach in the posterior and lateral portions of the fundus, concerning for pneumatosis although gas trapped between the gastric contents in the wall  could potentially have this appearance. Distal stomach and duodenum are involved by edema/fluid/inflammation with associated luminal narrowing. Ampulla is prominent, bulging into the lumen of the duodenum. Transverse duodenum is more preserved. No small bowel wall thickening. No small bowel dilatation. The terminal ileum is normal. The appendix is normal. No gross colonic mass. No colonic wall thickening. Vascular/Lymphatic: No abdominal aortic aneurysm. No abdominal aortic atherosclerotic calcification. Portal vein is markedly attenuated in the region of the portal splenic confluence the does appear to remain patent although there is just a string sign of lumen visible as the SM V enters the portal venous anatomy. The central splenic vein is also markedly attenuated at the confluence but splenic vein remains patent. Celiac axis, splenic artery, and SMA are patent without evidence for pseudoaneurysm. Scattered small abdominal lymph nodes evident. No pelvic sidewall lymphadenopathy. Reproductive: The prostate gland and seminal vesicles are unremarkable. Other: Scattered tiny foci of extraluminal gas are identified in the anterior abdomen. 1 collection is seen just deep to the right rectus musculature on axial 93/5. This was present previously and may be within the preperitoneal space as opposed to truly intraperitoneal in location although intraperitoneal gas is a possibility. Another collection is seen along the left rectus musculature but appears to be within the left rectus sheath (99/5). Both of these findings were present previously with some subcutaneous gas in the anterior abdominal wall on the previous CT presumably reflecting injection sites. Trace free fluid is seen in the pelvis. Musculoskeletal: No worrisome lytic or sclerotic osseous abnormality. IMPRESSION: 1. Areas of curvilinear gas density are seen between the dependent intraluminal gastric fluid and the wall of the stomach in the posterior and  lateral portions of the fundus, concerning for pneumatosis. The possibility that this represents a layer of gas trapped between the gastric contents and the wall is not entirely excluded, but gas is tracking along the dependent wall the stomach which would be somewhat unusual. 2. Marked dilatation of the main pancreatic duct measuring 12 mm diameter today compared to about 11 mm diameter previously. Pancreatic parenchyma enhances throughout. 3. The fluid collection identified on previous MRI between the pancreatic tail and lesser curvature of the  stomach measures 5.7 x 2.0 cm today compared to 6.9 x 2.3 cm on the previous MRI. 4. Bilobed fluid collection anterior to the pancreatic head has increased from 2.3 x 1.3 cm previously. 5. 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 The central splenic vein is also markedly attenuated at the confluence but splenic vein remains patent. 6. Scattered tiny foci of extraluminal gas in the anterior abdomen. The volume of this trace gas appear stable to minimally decreased in the interval since prior CT. One collection is seen just deep to the right rectus musculature on axial 93/5. This was present on the previous CT and may be within the preperitoneal space as opposed to truly intraperitoneal in location although intraperitoneal gas is a possibility. Another collection is seen along the left rectus musculature but appears to be within the left rectus sheath. Both of these findings were present previously. 7. Trace free fluid in the pelvis. Electronically Signed: By: Camellia Candle M.D. On: 01/27/2024 05:28    Scheduled Meds:  aspirin  EC  81 mg Oral Daily   feeding supplement (PROSource TF20)  60 mL Per Tube Daily   free water   75 mL Per Tube Q2H   loratadine   10 mg Oral Daily   metoprolol  succinate  12.5 mg Oral QHS   pantoprazole  (PROTONIX ) IV  40 mg Intravenous Q12H   sodium chloride  flush  3 mL  Intravenous Q12H   warfarin  1 mg Oral ONCE-1600   Warfarin - Pharmacist Dosing Inpatient   Does not apply q1600   Continuous Infusions:  feeding supplement (OSMOLITE 1.5 CAL) 1,000 mL (01/27/24 1248)     LOS: 17 days   Time spent: 55 minutes  Camellia Door, DO  [facility]  01/28/2024, 10:37 AM

## 2024-01-28 NOTE — Plan of Care (Signed)

## 2024-01-28 NOTE — Telephone Encounter (Signed)
 Error

## 2024-01-28 NOTE — Progress Notes (Signed)
 Nursing staff attempted to place NGT without success. Notified by nursing staff that patient did not want to try another attempt.  Had detailed conversation with patient about the need/recommendations of NGT. We also reviewed the risks of this not being placed. Patient expressed understanding.   He does not want to further attempt NGT placement.

## 2024-01-29 DIAGNOSIS — Z7901 Long term (current) use of anticoagulants: Secondary | ICD-10-CM | POA: Diagnosis not present

## 2024-01-29 DIAGNOSIS — K861 Other chronic pancreatitis: Secondary | ICD-10-CM | POA: Diagnosis not present

## 2024-01-29 DIAGNOSIS — Z952 Presence of prosthetic heart valve: Secondary | ICD-10-CM | POA: Diagnosis not present

## 2024-01-29 DIAGNOSIS — K863 Pseudocyst of pancreas: Secondary | ICD-10-CM | POA: Diagnosis not present

## 2024-01-29 DIAGNOSIS — E43 Unspecified severe protein-calorie malnutrition: Secondary | ICD-10-CM | POA: Diagnosis not present

## 2024-01-29 DIAGNOSIS — K859 Acute pancreatitis without necrosis or infection, unspecified: Secondary | ICD-10-CM | POA: Diagnosis not present

## 2024-01-29 LAB — COMPREHENSIVE METABOLIC PANEL WITH GFR
ALT: 16 U/L (ref 0–44)
AST: 14 U/L — ABNORMAL LOW (ref 15–41)
Albumin: 3.2 g/dL — ABNORMAL LOW (ref 3.5–5.0)
Alkaline Phosphatase: 83 U/L (ref 38–126)
Anion gap: 7 (ref 5–15)
BUN: 7 mg/dL (ref 6–20)
CO2: 27 mmol/L (ref 22–32)
Calcium: 8.6 mg/dL — ABNORMAL LOW (ref 8.9–10.3)
Chloride: 103 mmol/L (ref 98–111)
Creatinine, Ser: 0.78 mg/dL (ref 0.61–1.24)
GFR, Estimated: >60 mL/min (ref >60)
Glucose, Bld: 99 mg/dL (ref 70–99)
Potassium: 3.7 mmol/L (ref 3.5–5.1)
Sodium: 137 mmol/L (ref 135–145)
Total Bilirubin: 0.5 mg/dL (ref 0.0–1.2)
Total Protein: 6.4 g/dL — ABNORMAL LOW (ref 6.5–8.1)

## 2024-01-29 LAB — PROTIME-INR
INR: 1.4 — ABNORMAL HIGH (ref 0.8–1.2)
Prothrombin Time: 18 s — ABNORMAL HIGH (ref 11.4–15.2)

## 2024-01-29 LAB — PHOSPHORUS: Phosphorus: 3.6 mg/dL (ref 2.5–4.6)

## 2024-01-29 LAB — GLUCOSE, CAPILLARY
Glucose-Capillary: 75 mg/dL (ref 70–99)
Glucose-Capillary: 80 mg/dL (ref 70–99)
Glucose-Capillary: 88 mg/dL (ref 70–99)
Glucose-Capillary: 99 mg/dL (ref 70–99)

## 2024-01-29 LAB — MAGNESIUM: Magnesium: 2 mg/dL (ref 1.7–2.4)

## 2024-01-29 LAB — HEPARIN LEVEL (UNFRACTIONATED): Heparin Unfractionated: 0.14 [IU]/mL — ABNORMAL LOW (ref 0.30–0.70)

## 2024-01-29 LAB — TRIGLYCERIDES: Triglycerides: 96 mg/dL (ref <150)

## 2024-01-29 MED ORDER — LACTATED RINGERS IV SOLN: INTRAVENOUS | Status: AC

## 2024-01-29 MED ORDER — CHLORHEXIDINE GLUCONATE CLOTH 2 % EX PADS
6.0000 | MEDICATED_PAD | Freq: Every day | CUTANEOUS | Status: DC
  Administered 2024-01-29 – 2024-02-17 (×20): 6 via TOPICAL

## 2024-01-29 MED ORDER — HEPARIN (PORCINE) 25000 UT/250ML-% IV SOLN
1400.0000 [IU]/h | INTRAVENOUS | Status: DC
  Administered 2024-01-29: 1000 [IU]/h via INTRAVENOUS
  Filled 2024-01-29: qty 250

## 2024-01-29 MED ORDER — TRAVASOL 10 % IV SOLN
INTRAVENOUS | Status: AC
  Filled 2024-01-29: qty 540

## 2024-01-29 MED ORDER — INSULIN ASPART 100 UNIT/ML IJ SOLN
0.0000 [IU] | Freq: Four times a day (QID) | INTRAMUSCULAR | Status: DC
  Administered 2024-01-30 – 2024-01-31 (×5): 1 [IU] via SUBCUTANEOUS
  Administered 2024-01-31 – 2024-02-01 (×2): 2 [IU] via SUBCUTANEOUS
  Administered 2024-02-01 – 2024-02-03 (×5): 1 [IU] via SUBCUTANEOUS
  Administered 2024-02-03: 2 [IU] via SUBCUTANEOUS
  Administered 2024-02-03 – 2024-02-04 (×2): 1 [IU] via SUBCUTANEOUS
  Administered 2024-02-04: 2 [IU] via SUBCUTANEOUS
  Administered 2024-02-04 – 2024-02-06 (×8): 1 [IU] via SUBCUTANEOUS
  Administered 2024-02-06: 2 [IU] via SUBCUTANEOUS
  Administered 2024-02-07 – 2024-02-15 (×19): 1 [IU] via SUBCUTANEOUS
  Administered 2024-02-16: 2 [IU] via SUBCUTANEOUS
  Administered 2024-02-16 – 2024-02-17 (×5): 1 [IU] via SUBCUTANEOUS

## 2024-01-29 MED ORDER — WARFARIN SODIUM 2.5 MG PO TABS
2.5000 mg | ORAL_TABLET | Freq: Once | ORAL | Status: AC
  Administered 2024-01-29: 2.5 mg
  Filled 2024-01-29: qty 1

## 2024-01-29 MED ORDER — SODIUM CHLORIDE 0.9% FLUSH: 10.0000 mL | INTRAVENOUS | Status: DC | PRN

## 2024-01-29 MED ORDER — WARFARIN SODIUM 2.5 MG PO TABS: 2.5000 mg | ORAL_TABLET | Freq: Once | ORAL | Status: DC

## 2024-01-29 NOTE — Plan of Care (Signed)

## 2024-01-29 NOTE — Progress Notes (Signed)
 PHARMACY - ANTICOAGULATION CONSULT NOTE  Pharmacy Consult for Warfarin Indication: On-X mechanical AVR  Allergies  Allergen Reactions   Amoxicillin Hives    Patient Measurements: Height: 5' 11 (180.3 cm) Weight: 68.8 kg (151 lb 10.8 oz) IBW/kg (Calculated) : 75.3 HEPARIN  DW (KG): 71  Vital Signs: Temp: 98.5 F (36.9 C) (08/20 2015) Temp Source: Oral (08/20 1314) BP: 126/87 (08/20 2015) Pulse Rate: 86 (08/20 2015)  Labs: Recent Labs    01/27/24 0229 01/28/24 0413 01/29/24 0209 01/29/24 2112  HGB 10.6*  --   --   --   HCT 31.7*  --   --   --   PLT 354  --   --   --   LABPROT 27.7* 24.2* 18.0*  --   INR 2.4* 2.1* 1.4*  --   HEPARINUNFRC  --   --   --  0.14*  CREATININE 0.86  --  0.78  --     Estimated Creatinine Clearance: 127.8 mL/min (by C-G formula based on SCr of 0.78 mg/dL).  Assessment: 33 yo M presenting with pancreatitis. Warfarin PTA regimen is 6 mg Monday and Thursday and 4.5 mg all other days (total weekly dose: 34.5 mg).  Of note, patient started rifaximin  8/9 which may decrease INR.   8/20 AM: Per CCS note, no surgical intervention planned. TPN to start today. Predictable INR dropfollowing multiple held doses, just slightly below goal at 1.4.   Initial heparin  level low at 0.14.  No overt bleeding or complications noted.  No known issues with IV infusion.  Goal of Therapy:  INR 1.5-2.0 Monitor platelets by anticoagulation protocol: Yes   Plan:  Increase IV heparin  to 1200 units/hr Repeat heparin  level in 6 hrs Daily heparin  level and CBC.  Harlene Barlow, Berdine JONETTA CORP, BCCP Clinical Pharmacist  01/29/2024 10:42 PM   [facility] pharmacy phone numbers are listed on [email or web address]

## 2024-01-29 NOTE — Progress Notes (Addendum)
 Daily Progress Note  DOA: 01/11/2024 Hospital Day: 16  Cc: acute on chronic pancreatitis with new pseudocyst  ASSESSMENT    Patient Profile: 33 y.o. year old male with a past medical history including but not necessarily limited to chronic pancreatitis, history of aortic valve replacement requiring chronic anticoagulation, SIBO.   Acute on chronic pancreatitis complicated by acute fluid collections / pseudocysts and more recent development of gastric pneumatosis.  After discussion of the risks and benefits of different nutrition options, plan is to proceed with TPN for now due to concurrent pancreatitis and gastric pneumatosis. Surgery recommending bowel rest. Patient was not able to tolerate NGT placement for gastric decompression.  Discussed with surgery and they are planning for repeat CT A/P tomorrow. Once enteral feeding can be restarted, will plan for another discussion about the idea of J-tube with surgery and the patient. Patient has been able to control his ab pain with PO pain meds.  Weight loss and malnutrition secondary to above  AVR, chronically anticoagulated with warfarin  SIBO Positive outpatient SIBO test, s/p course of rifaximin  here in the hospital    PLAN   - Continue NPO status - Continue pain control - PICC line in place for TPN initiation later today - Discussed with general surgery who is planning for repeat CT A/P tomorrow to evaluate his gastric pneumatosis   Subjective   Pain has been relatively well controlled. After he walked around some, he did have a slight exacerbation in his ab pain but overall seems tolerable. PICC line was placed today    Objective   GI Studies:    Recent Labs    01/27/24 0229  WBC 7.6  HGB 10.6*  HCT 31.7*  MCV 87.3  PLT 354   No results for input(s): FOLATE, VITAMINB12, FERRITIN, TIBC, IRONPCTSAT in the last 72 hours. Recent Labs    01/27/24 0229 01/29/24 0209  NA 138 137  K 4.0 3.7  CL 99  103  CO2 30 27  GLUCOSE 104* 99  BUN 10 7  CREATININE 0.86 0.78  CALCIUM  9.3 8.6*   Recent Labs    01/27/24 0229 01/29/24 0209  PROT 6.6 6.4*  ALBUMIN  3.3* 3.2*  AST 17 14*  ALT 22 16  ALKPHOS 72 83  BILITOT 0.5 0.5      Imaging:  US  EKG SITE RITE If Site Rite image not attached, placement could not be confirmed due to  current cardiac rhythm.     Scheduled inpatient medications:   aspirin  EC  81 mg Oral Daily   Chlorhexidine  Gluconate Cloth  6 each Topical Daily   insulin  aspart  0-9 Units Subcutaneous Q6H   metoprolol  tartrate  2.5 mg Intravenous Q6H   pantoprazole  (PROTONIX ) IV  40 mg Intravenous Q12H   sodium chloride  flush  3 mL Intravenous Q12H   warfarin  2.5 mg Per Tube ONCE-1600   Warfarin - Pharmacist Dosing Inpatient   Does not apply q1600   Continuous inpatient infusions:   acetaminophen      heparin      lactated ringers  45 mL/hr at 01/29/24 1332   TPN ADULT (ION)     PRN inpatient medications: acetaminophen , albuterol , alum & mag hydroxide-simeth, hydrALAZINE , hydrOXYzine , ondansetron  **OR** ondansetron  (ZOFRAN ) IV, oxyCODONE -acetaminophen , phenol, sodium chloride  flush  Vital signs in last 24 hours: Temp:  [97.7 F (36.5 C)-98.2 F (36.8 C)] 97.7 F (36.5 C) (08/20 1314) Pulse Rate:  [64-82] 82 (08/20 1314) Resp:  [16-18] 18 (08/20 1314) BP: (111-127)/(70-80)  115/74 (08/20 1314) SpO2:  [97 %-100 %] 100 % (08/20 1314) Last BM Date : 01/24/24  Intake/Output Summary (Last 24 hours) at 01/29/2024 1456 Last data filed at 01/29/2024 0600 Gross per 24 hour  Intake 602.5 ml  Output --  Net 602.5 ml    Intake/Output from previous day: 08/19 0701 - 08/20 0700 In: 752.5 [I.V.:602.5; NG/GT:150] Out: -  Intake/Output this shift: No intake/output data recorded.   Physical Exam:  General: Alert male in NAD Heart:  Regular rate and rhythm.  Pulmonary: Normal respiratory effort Abdomen: Soft, nondistended, nontender. Normal bowel  sounds. Extremities: No lower extremity edema  Neurologic: Alert and oriented Psych: Pleasant. Cooperative   LOS: 18 days   Rosario JAYSON Kidney ,MD 01/29/2024, 2:56 PM

## 2024-01-29 NOTE — Consult Note (Signed)
 Nutrition Short Note  Per GI, pt to have complete bowel rest r/t pneumatosis. GI recommends against endoscopic intervention at this time r/t to the pneumatosis and fluid collection with pancreatitis.  EN was discontinued, continues to have Cortrak in place. Per IDT discussion, pt to start TPN until possible PEJ tube placement/EN re-initiation. NG was attempted for suction, however, pt did not tolerate NG placement. GI recommends to re-attempt if nausea/vomiting occurs. Will continue to monitor, RDN available prn.   Estimated Nutritional Needs:    Kcal:  2000-2350 kcal   Protein:  105-140 g   Fluid:  >/=2L  NUTRITION DIAGNOSIS:    Severe Malnutrition related to vomiting, chronic illness, altered GI function as evidenced by moderate fat depletion, severe muscle depletion.   Ongoing   GOAL:    Patient will meet greater than or equal to 90% of their needs   Progressing   MONITOR:    PO intake, Weight trends, TF tolerance, Supplement acceptance, Diet advancement, Labs, Skin    Plan: Start TPN at 45 mL/hr at 1800 to provide 50% nutritional needs Electrolytes in TPN: Na 79mEq/L, K 50mEq/L, Ca 45mEq/L, Mg 52mEq/L, and Phos 15mmol/L. Cl:Ac 1:1 Add standard MVI and trace elements to TPN Initiate Sensitive q6h SSI at [facility] and adjust as needed  MIVF to 45 mL/hr at 1800 while on 50% TPN Monitor TPN labs on Mon/Thurs, daily PRN  Goal TPN rate is 90 mL/hr (provides 108 g of protein and 2120 kcals per day)    Javone Ybanez Daml-Budig, RDN, LDN Registered Dietitian Nutritionist RD Inpatient Contact Info in [place]

## 2024-01-29 NOTE — Progress Notes (Signed)
 Peripherally Inserted Central Catheter Placement  The IV Nurse has discussed with the patient and/or persons authorized to consent for the patient, the purpose of this procedure and the potential benefits and risks involved with this procedure.  The benefits include less needle sticks, lab draws from the catheter, and the patient may be discharged home with the catheter. Risks include, but not limited to, infection, bleeding, blood clot (thrombus formation), and puncture of an artery; nerve damage and irregular heartbeat and possibility to perform a PICC exchange if needed/ordered by physician.  Alternatives to this procedure were also discussed.  Bard Power PICC patient education guide, fact sheet on infection prevention and patient information card has been provided to patient /or left at bedside.    PICC Placement Documentation  PICC Double Lumen 01/29/24 Right Brachial 40 cm 1 cm (Active)  Indication for Insertion or Continuance of Line Administration of hyperosmolar/irritating solutions (i.e. TPN, Vancomycin , etc.) 01/29/24 0950  Exposed Catheter (cm) 0 cm 01/29/24 0950  Site Assessment Clean, Dry, Intact 01/29/24 0950  Lumen #1 Status Flushed;Saline locked;Blood return noted 01/29/24 0950  Lumen #2 Status Flushed;Saline locked;Blood return noted 01/29/24 0950  Dressing Type Transparent;Securing device 01/29/24 0950  Dressing Status Antimicrobial disc/dressing in place;Clean, Dry, Intact 01/29/24 0950  Line Care Connections checked and tightened 01/29/24 0950  Line Adjustment (NICU/IV Team Only) No 01/29/24 0950  Dressing Intervention New dressing;Adhesive placed at insertion site (IV team only) 01/29/24 0950  Dressing Change Due 02/05/24 01/29/24 0950       Jolee Na 01/29/2024, 10:12 AM

## 2024-01-29 NOTE — Progress Notes (Addendum)
 PHARMACY - ANTICOAGULATION CONSULT NOTE  Pharmacy Consult for Warfarin Indication: On-X mechanical AVR  Allergies  Allergen Reactions   Amoxicillin Hives    Patient Measurements: Height: 5' 11 (180.3 cm) Weight: 68.8 kg (151 lb 10.8 oz) IBW/kg (Calculated) : 75.3 HEPARIN  DW (KG): 71  Vital Signs: Temp: 98 F (36.7 C) (08/20 0440) Temp Source: Oral (08/20 0440) BP: 127/80 (08/20 0440) Pulse Rate: 64 (08/20 0440)  Labs: Recent Labs    01/27/24 0229 01/28/24 0413 01/29/24 0209  HGB 10.6*  --   --   HCT 31.7*  --   --   PLT 354  --   --   LABPROT 27.7* 24.2* 18.0*  INR 2.4* 2.1* 1.4*  CREATININE 0.86  --  0.78    Estimated Creatinine Clearance: 127.8 mL/min (by C-G formula based on SCr of 0.78 mg/dL).  Assessment: 33 yo M presenting with pancreatitis. Warfarin PTA regimen is 6 mg Monday and Thursday and 4.5 mg all other days (total weekly dose: 34.5 mg).  Of note, patient started rifaximin  8/9 which may decrease INR.   8/20 AM: Per CCS note, no surgical intervention planned. TPN to start today. Predictable INR dropfollowing multiple held doses, just slightly below goal at 1.4.   Goal of Therapy:  INR 1.5-2.0 Monitor platelets by anticoagulation protocol: Yes   Plan:  Warfarin 2.5 mg PO x1  Continue daily PT/INR.  ADDENDUM:  Pharmacy consulted to start heparin  until INR returns to therapeutic level.   - start heparin  at 1,000 units/hour  - Heparin  level in 6 hours - Heparin  level and CBC daily    Massie Fila, PharmD Clinical Pharmacist  01/29/2024 7:27 AM

## 2024-01-29 NOTE — Progress Notes (Signed)
 PROGRESS NOTE   [name] [name]  FMW:[id number]    DOB: Mar 10, 1991    DOA: 01/11/2024  PCP: Lucius Krabbe, NP   I have briefly reviewed patients previous medical [facility] in [facility].   Brief Hospital Course:  33 year old male with medical history significant for hypertension, s/p mechanical aortic valve replacement 09/27/21 on chronic warfarin anticoagulation, recurrent pancreatitis with multiple hospitalizations, asthma, GERD, prior history of alcohol use disorder (abstinent for 10 months PTA), presented to the hospital on 01/11/2024 with complaints of abdominal pain and admitted for AKI, pancreatitis with pseudocyst.  GI was consulted, patient was treated supportively with n.p.o., core track with postpyloric feeding.  Core track was dislodged on 8/18, briefly treated with liquid diet.  CT A/P on 8/18 showed gastric pneumatosis, general surgery was consulted, octreotide  stopped, patient unable to tolerate NG tube placement, made n.p.o. again, PICC line placed and starting TPN on 8/20.   Assessment & Plan:   Acute on chronic pancreatitis, recurrent pancreatitis Acute fluid collection/pseudocyst Gastric pneumatosis As per chart review, EUS by Dr. Wilhelmenia in 07/2023 that showed cystic lesion in the head of the pancreas consistent with a pseudocyst and a 5 mm stone within the pancreatic duct at the junction of the head/neck.  Cytology of the cyst was benign.  He was scheduled for an ERCP with attempted stone extraction in September with Dr. Wilhelmenia but now admitted with abdominal pain. [facility] was consulted, patient was treated supportively with n.p.o., IVF, core track with postpyloric feeding (initially started on 8/6).  Core track was dislodged on 8/18, briefly treated with liquid diet and core track was replaced.   CT A/P on 8/18 showed gastric pneumatosis, general surgery was consulted, octreotide  stopped (to avoid dysmotility), patient unable to tolerate NG tube  placement, made n.p.o. again/tube feeds held, gentle IVF continuing, PICC line placed and starting TPN on 8/20. As per CCS follow-up, plan for bowel rest today, tentative plan to repeat CT abdomen tomorrow.  Defer timing of resumption of postpyloric feeding tube & PEJ placement to CCS.  S/p mechanical aortic valve replacement and aortoplasty INR is 1.4, subtherapeutic (goal INR 1.5-2) Continue warfarin per pharmacy and will initiate IV heparin  bridge until INR is therapeutic Continue aspirin  81 mg daily. Follows with Dr. Wendel, Cardiology and the Coumadin  Clinic  Essential hypertension Controlled on metoprolol  IV 2.5 mg every 6 hours.  Alcohol use disorder, in remission Stable  Asthma Stable  Anemia of Chronic Disease Stable.  Small intestinal bacterial overgrowth (SIBO) Completed 14 days of rifaximin .  Resolved.  Nutrition Status: Nutrition Problem: Severe Malnutrition Etiology: vomiting, chronic illness, altered GI function Signs/Symptoms: moderate fat depletion, severe muscle depletion Interventions: Refer to RD note for recommendations Agree with registered dietitian evaluation and management recommendations.  Body mass index is 21.15 kg/m.   DVT prophylaxis:   Initiating IV heparin  bridge until INR is therapeutic on warfarin per pharmacy.   Code Status: Full Code:  Family Communication: None at bedside. Disposition:  Status is: Inpatient Remains inpatient appropriate because: Complex case.  Gastric pneumatosis, remains n.p.o., plans to start TPN tonight, IV fluids and meds.     Consultants:   [facility] General surgery  Procedures:   As noted above  Subjective:  Patient evaluated along with RN in the room.  He is visibly frustrated.  States that he will never again allow an NG tube to be placed but seems to be agreeable if it absolutely needs to be placed and can be done under  sedation.  Wants a glass of water  but is aware that he cannot have any because of  his n.p.o. status.  Feels worn out.  Indicates that his abdominal pain is controlled.  Objective:   Vitals:   01/29/24 0043 01/29/24 0440 01/29/24 1055 01/29/24 1314  BP: 121/80 127/80 118/76 115/74  Pulse: 66 64 77 82  Resp: 17 17 17 18   Temp: 98 F (36.7 C) 98 F (36.7 C) 98 F (36.7 C) 97.7 F (36.5 C)  TempSrc: Oral Oral Oral Oral  SpO2: 98% 97% 98% 100%  Weight:      Height:        General exam: Young male, moderately built, frail and thinly nourished sitting up comfortably in chair without distress.  Has left nostril core track which is clamped. Respiratory system: Clear to auscultation. Respiratory effort normal. Cardiovascular system: S1 & S2 heard, RRR. No JVD, murmurs, rubs, gallops or clicks. No pedal edema.  Not on telemetry. Gastrointestinal system: Abdomen is nondistended, soft and nontender. No organomegaly or masses felt. Normal bowel sounds heard. Central nervous system: Alert and oriented. No focal neurological deficits. Extremities: Symmetric 5 x 5 power.  Right upper arm PICC line. Skin: No rashes, lesions or ulcers Psychiatry: Judgement and insight appear normal. Mood & affect appropriate.     Data Reviewed:   I have personally reviewed following labs and imaging studies   CBC: Recent Labs  Lab 01/25/24 0418 01/27/24 0229  WBC 6.2 7.6  NEUTROABS 3.3  --   HGB 10.5* 10.6*  HCT 31.3* 31.7*  MCV 86.5 87.3  PLT 264 354    Basic Metabolic Panel: Recent Labs  Lab 01/25/24 0418 01/27/24 0229 01/29/24 0209  NA 138 138 137  K 3.9 4.0 3.7  CL 99 99 103  CO2 29 30 27   GLUCOSE 104* 104* 99  BUN 11 10 7   CREATININE 0.70 0.86 0.78  CALCIUM  8.8* 9.3 8.6*  MG  --   --  2.0  PHOS  --   --  3.6    Liver Function Tests: Recent Labs  Lab 01/25/24 0418 01/27/24 0229 01/29/24 0209  AST 21 17 14*  ALT 26 22 16   ALKPHOS 68 72 83  BILITOT 0.4 0.5 0.5  PROT 6.7 6.6 6.4*  ALBUMIN  3.4* 3.3* 3.2*    CBG: Recent Labs  Lab 01/28/24 2042  01/29/24 0041 01/29/24 0437  GLUCAP 95 99 88    Microbiology Studies:  No results found for this or any previous visit (from the past 240 hours).  Radiology Studies:  US  EKG SITE RITE Result Date: 01/28/2024 If Site Rite image not attached, placement could not be confirmed due to current cardiac rhythm.   Scheduled Meds:    aspirin  EC  81 mg Oral Daily   Chlorhexidine  Gluconate Cloth  6 each Topical Daily   insulin  aspart  0-9 Units Subcutaneous Q6H   metoprolol  tartrate  2.5 mg Intravenous Q6H   pantoprazole  (PROTONIX ) IV  40 mg Intravenous Q12H   sodium chloride  flush  3 mL Intravenous Q12H   warfarin  2.5 mg Per Tube ONCE-1600   Warfarin - Pharmacist Dosing Inpatient   Does not apply q1600    Continuous Infusions:    acetaminophen      lactated ringers  45 mL/hr at 01/29/24 1332   TPN ADULT (ION)       LOS: 18 days     Trenda Mar, MD,  FACP, [name], [facility], [facility]   [facility] Physician Advisor [facility]      To contact the attending provider between 7A-7P or the covering provider during after hours 7P-7A, please log into the web site [email or web address] and access using universal [place] password for that web site. If you do not have the password, please call the hospital operator.  01/29/2024, 1:59 PM

## 2024-01-29 NOTE — TOC Progression Note (Addendum)
 Transition of Care [facility]) - Progression Note    Patient Details  Name: [name] [name] [place] MRN: [id number] Date of Birth: 11/02/1990  Transition of Care [facility]) CM/SW Contact  [name] JONELLE Joe, RN Phone Number: 01/29/2024, 12:53 PM  Clinical Narrative:    CM called and spoke with Holley Herring, RNCM with [employer] to place referral for patient's need for TPN versus Enteral feeds for at home based on medical recommendation.  Patient had right arm PICC line placed.  Patient remains npo at this time with Cortrak noted in the left nare that is currently clamped.  CM will continue to follow the patient for IP care management needs.  CM with IP Care management team will continue to follow the patient for [facility] needs for home when stable.                     Expected Discharge Plan and Services                                               Social Drivers of Health (SDOH) Interventions SDOH Screenings   Food Insecurity: No Food Insecurity (01/11/2024)  Housing: Low Risk  (01/11/2024)  Transportation Needs: No Transportation Needs (01/11/2024)  Utilities: Not At Risk (01/11/2024)  Depression (PHQ2-9): Medium Risk (09/09/2023)  Financial Resource Strain: Low Risk  (04/25/2023)  Physical Activity: Sufficiently Active (04/25/2023)  Social Connections: Unknown (04/25/2023)  Stress: No Stress Concern Present (04/25/2023)  Tobacco Use: Low Risk  (01/20/2024)    Readmission Risk Interventions    01/29/2024   12:53 PM 01/15/2024   11:03 AM  Readmission Risk Prevention Plan  Post Dischage Appt  Complete  Medication Screening  Complete  Transportation Screening Complete Complete  PCP or Specialist Appt within 5-7 Days Complete   Home Care Screening Complete   Medication Review (RN CM) Complete

## 2024-01-29 NOTE — Progress Notes (Signed)
 Progress Note     Subjective: Patient reports continued epigastric pain. He feels that this is manageable at this time with current regimen. Has not really needed IV pain medication. He reports intermittent nausea. Denies nausea currently. Denies vomiting. Reports that his last bowel movement was around 8/15. Reports flatulence   ROS  All negative with the exception of above.  Objective: Vital signs in last 24 hours: Temp:  [97.5 F (36.4 C)-98.2 F (36.8 C)] 98 F (36.7 C) (08/20 0440) Pulse Rate:  [63-72] 64 (08/20 0440) Resp:  [16-17] 17 (08/20 0440) BP: (111-127)/(70-91) 127/80 (08/20 0440) SpO2:  [97 %-100 %] 97 % (08/20 0440) Last BM Date : 01/24/24  Intake/Output from previous day: 08/19 0701 - 08/20 0700 In: 752.5 [I.V.:602.5; NG/GT:150] Out: -  Intake/Output this shift: No intake/output data recorded.  PE: General: Pleasant male who is laying in bed in NAD. HEENT: Feeding tube in place and visible at nare. Lungs: Respiratory effort nonlabored Abd: Soft and ND. Mild tenderness to palpation of epigastric region. No rebound tenderness or guarding. Psych: A&Ox3 with an appropriate affect.    Lab Results:  Recent Labs    01/27/24 0229  WBC 7.6  HGB 10.6*  HCT 31.7*  PLT 354   BMET Recent Labs    01/27/24 0229 01/29/24 0209  NA 138 137  K 4.0 3.7  CL 99 103  CO2 30 27  GLUCOSE 104* 99  BUN 10 7  CREATININE 0.86 0.78  CALCIUM  9.3 8.6*   PT/INR Recent Labs    01/28/24 0413 01/29/24 0209  LABPROT 24.2* 18.0*  INR 2.1* 1.4*   CMP     Component Value Date/Time   NA 137 01/29/2024 0209   NA 142 08/30/2021 1430   K 3.7 01/29/2024 0209   CL 103 01/29/2024 0209   CO2 27 01/29/2024 0209   GLUCOSE 99 01/29/2024 0209   BUN 7 01/29/2024 0209   BUN 13 08/30/2021 1430   CREATININE 0.78 01/29/2024 0209   CALCIUM  8.6 (L) 01/29/2024 0209   PROT 6.4 (L) 01/29/2024 0209   ALBUMIN  3.2 (L) 01/29/2024 0209   AST 14 (L) 01/29/2024 0209   ALT 16  01/29/2024 0209   ALKPHOS 83 01/29/2024 0209   BILITOT 0.5 01/29/2024 0209   GFRNONAA >60 01/29/2024 0209   Lipase     Component Value Date/Time   LIPASE 344 (H) 01/27/2024 0229       Studies/Results: US  EKG SITE RITE Result Date: 01/28/2024 If Site Rite image not attached, placement could not be confirmed due to current cardiac rhythm.  DG Abd Portable 1V Result Date: 01/27/2024 CLINICAL DATA:  Feeding tube placement. EXAM: PORTABLE ABDOMEN - 1 VIEW COMPARISON:  01/21/2024 FINDINGS: Feeding tube tip is in the distal duodenum at the ligament of Treitz. Visualized upper abdomen demonstrates nonspecific bowel gas pattern. IMPRESSION: Feeding tube tip is in the distal duodenum at the ligament of Treitz. Electronically Signed   By: Camellia Candle M.D.   On: 01/27/2024 10:50    Anti-infectives: Anti-infectives (From admission, onward)    Start     Dose/Rate Route Frequency Ordered Stop   01/18/24 1230  rifaximin  (XIFAXAN ) tablet 550 mg  Status:  Discontinued        550 mg Oral 3 times daily 01/18/24 1132 01/26/24 1323        Assessment/Plan Acute on chronic pancreatitis Calcifications of head of the pancreas Pancreatic pseudocyst Gastric pneumatosis -MRCP completed 8/14 -Most recent imaging includes: CT from 8/18 showed  gastric pneumatosis. Additionally dilation of pancreatic duct noted measuring 12 mm diameter. The fluid collection identified on previous MRI between the pancreatic tail and lesser curvature of the stomach measured 5.7 x 2.0 cm. Bilobed fluid collection anterior to the pancreatic head increased. Scattered tiny foci of extraluminal gas in the anterior abdomen. The volume of this trace gas appear stable to minimally decreased in the interval since prior CT. One collection is seen just deep to the right rectus musculature on axial 93/5. This was present on the previous CT and may be within the preperitoneal space as opposed to truly intraperitoneal in location although  intraperitoneal gas is a possibility. Another collection is seen along the left rectus musculature but appears to be within the left rectus sheath. Both of these findings were present previously. Trace free fluid in the pelvis. -DG Abd Portable 1V on 8/18 showing feeding tube tip is in the distal duodenum at the ligament of Treitz. -Octreotide  was discontinued 8/18. -Afebrile. Vitals stable -No acute surgical intervention recommended at this time. Nutrition needing to be optimized.  -Patient not able to tolerate NGT placement. If has intractable nausea or vomiting, would recommend replacement. Continue NPO -Will continue to monitor. Will plan for repeat imaging to revaluate pneumatosis. -Surgery will continue to follow.     FEN: NPO; NGT if nausea/vomiting; TPN per hospital team; IVF per hospital team VTE: Coumadin  ID: None currently    LOS: 18 days   I reviewed specialist notes, hospitalist notes, last 24 h vitals and pain scores, last 48 h intake and output, last 24 h labs and trends, and last 24 h imaging results.  This care required moderate level of medical decision making.    Marjorie Carlyon Favre, [facility] Surgery 01/29/2024, 8:47 AM Please see Amion for pager number during day hours 7:00am-4:30pm

## 2024-01-29 NOTE — Progress Notes (Signed)
 PHARMACY - TOTAL PARENTERAL NUTRITION CONSULT NOTE   Indication: severe pancreatitis and anticipated prolong NPO status  Patient Measurements: Height: 5' 11 (180.3 cm) Weight: 68.8 kg (151 lb 10.8 oz) IBW/kg (Calculated) : 75.3 TPN AdjBW (KG): 71 Body mass index is 21.15 kg/m. Usual Weight: 66 kg  Assessment:  33 y.o. year old male with a past medical history significant for chronic pancreatitis, history of aortic valve replacement requiring chronic anticoagulation, SIBO s/p course of rifaximin . Surgery has recommended postpyloric feedings be held due to the presence of pneumatosis. Mansouraty who recommends against any endoscopic intervention at this time due to gastric pneumatosis and immature wall of the pancreas fluid collection. PEJ to be considered when enteral feeding can be safely resumed. Pharmacy consulted to initiate TPN in the interim.  Glucose / Insulin : no hx DM, CBGs < 150 with no insulin  needed Electrolytes: K 3.7, Phos 3.6, Mg 2, others wnl Renal: Scr 0.78, BUN 7 Hepatic: AST/ALT wnl, alb 3.2, Tbili 0.5, TG 96 (recently elevated 08/2023) Intake / Output; MIVF: LBM 8/15, UOP not charted  GI Imaging: 8/02 CT: severe sequelae of advanced pancreatitis 8/14 MRCP: acute pancreatitis, severe pancreatic ductal dilatation, concerning for ulceration to the bowel lumen 8/18 CT: gas density between the intraluminal gastric fluid and the wall of the stomach in the posterior and lateral portions of the fundus, concerning for pneumatosis  GI Surgeries / Procedures:  None since starting TPN  Central access: (01/29/24)  TPN start date: (01/29/24)  Nutritional Goals: Goal TPN rate is 90 mL/hr (provides 108 g of protein and 2120 kcals per day)  RD Assessment: Estimated Needs Total Energy Estimated Needs: 2000-2350 kcal Total Protein Estimated Needs: 105-140 g Total Fluid Estimated Needs: >/=2L  Current Nutrition:  NPO  Plan:  Start TPN at 45 mL/hr at 1800 to provide 50%  nutritional needs Electrolytes in TPN: Na 52mEq/L, K 24mEq/L, Ca 21mEq/L, Mg 22mEq/L, and Phos 15mmol/L. Cl:Ac 1:1 Add standard MVI and trace elements to TPN Initiate Sensitive q6h SSI at [facility] and adjust as needed  MIVF to 45 mL/hr at 1800 while on 50% TPN Monitor TPN labs on Mon/Thurs, daily PRN  Thank you for allowing pharmacy to be a part of this patient's care.  Shelba Collier, PharmD, BCPS Clinical Pharmacist

## 2024-01-30 ENCOUNTER — Inpatient Hospital Stay (HOSPITAL_COMMUNITY)

## 2024-01-30 DIAGNOSIS — R188 Other ascites: Secondary | ICD-10-CM | POA: Diagnosis not present

## 2024-01-30 DIAGNOSIS — K859 Acute pancreatitis without necrosis or infection, unspecified: Secondary | ICD-10-CM | POA: Diagnosis not present

## 2024-01-30 DIAGNOSIS — F1011 Alcohol abuse, in remission: Secondary | ICD-10-CM | POA: Diagnosis not present

## 2024-01-30 DIAGNOSIS — K863 Pseudocyst of pancreas: Secondary | ICD-10-CM | POA: Diagnosis not present

## 2024-01-30 DIAGNOSIS — K861 Other chronic pancreatitis: Secondary | ICD-10-CM | POA: Diagnosis not present

## 2024-01-30 LAB — CBC
HCT: 29.6 % — ABNORMAL LOW (ref 39.0–52.0)
Hemoglobin: 9.9 g/dL — ABNORMAL LOW (ref 13.0–17.0)
MCH: 29.1 pg (ref 26.0–34.0)
MCHC: 33.4 g/dL (ref 30.0–36.0)
MCV: 87.1 fL (ref 80.0–100.0)
Platelets: 252 K/uL (ref 150–400)
RBC: 3.4 MIL/uL — ABNORMAL LOW (ref 4.22–5.81)
RDW: 13.3 % (ref 11.5–15.5)
WBC: 5.5 K/uL (ref 4.0–10.5)
nRBC: 0 % (ref 0.0–0.2)

## 2024-01-30 LAB — COMPREHENSIVE METABOLIC PANEL WITH GFR
ALT: 15 U/L (ref 0–44)
AST: 15 U/L (ref 15–41)
Albumin: 3.2 g/dL — ABNORMAL LOW (ref 3.5–5.0)
Alkaline Phosphatase: 85 U/L (ref 38–126)
Anion gap: 13 (ref 5–15)
BUN: 10 mg/dL (ref 6–20)
CO2: 25 mmol/L (ref 22–32)
Calcium: 8.6 mg/dL — ABNORMAL LOW (ref 8.9–10.3)
Chloride: 102 mmol/L (ref 98–111)
Creatinine, Ser: 0.69 mg/dL (ref 0.61–1.24)
GFR, Estimated: >60 mL/min (ref >60)
Glucose, Bld: 123 mg/dL — ABNORMAL HIGH (ref 70–99)
Potassium: 3.7 mmol/L (ref 3.5–5.1)
Sodium: 140 mmol/L (ref 135–145)
Total Bilirubin: 0.6 mg/dL (ref 0.0–1.2)
Total Protein: 6.3 g/dL — ABNORMAL LOW (ref 6.5–8.1)

## 2024-01-30 LAB — PHOSPHORUS: Phosphorus: 4 mg/dL (ref 2.5–4.6)

## 2024-01-30 LAB — PROTIME-INR
INR: 1.5 — ABNORMAL HIGH (ref 0.8–1.2)
Prothrombin Time: 19.3 s — ABNORMAL HIGH (ref 11.4–15.2)

## 2024-01-30 LAB — GLUCOSE, CAPILLARY
Glucose-Capillary: 100 mg/dL — ABNORMAL HIGH (ref 70–99)
Glucose-Capillary: 117 mg/dL — ABNORMAL HIGH (ref 70–99)
Glucose-Capillary: 124 mg/dL — ABNORMAL HIGH (ref 70–99)
Glucose-Capillary: 135 mg/dL — ABNORMAL HIGH (ref 70–99)
Glucose-Capillary: 144 mg/dL — ABNORMAL HIGH (ref 70–99)

## 2024-01-30 LAB — MAGNESIUM: Magnesium: 1.9 mg/dL (ref 1.7–2.4)

## 2024-01-30 LAB — HEPARIN LEVEL (UNFRACTIONATED): Heparin Unfractionated: 0.23 [IU]/mL — ABNORMAL LOW (ref 0.30–0.70)

## 2024-01-30 MED ORDER — IOHEXOL 350 MG/ML SOLN
70.0000 mL | Freq: Once | INTRAVENOUS | Status: AC | PRN
  Administered 2024-01-30: 70 mL via INTRAVENOUS

## 2024-01-30 MED ORDER — WARFARIN SODIUM 4 MG PO TABS
4.5000 mg | ORAL_TABLET | Freq: Once | ORAL | Status: AC
  Administered 2024-01-30: 4.5 mg
  Filled 2024-01-30: qty 1

## 2024-01-30 MED ORDER — IOHEXOL 9 MG/ML PO SOLN
500.0000 mL | ORAL | Status: AC
  Administered 2024-01-30 (×2): 500 mL via ORAL

## 2024-01-30 MED ORDER — HYDROMORPHONE HCL 1 MG/ML IJ SOLN
0.5000 mg | INTRAMUSCULAR | Status: DC | PRN
  Administered 2024-01-30 – 2024-02-17 (×58): 1 mg via INTRAVENOUS
  Filled 2024-01-30 (×59): qty 1

## 2024-01-30 MED ORDER — MORPHINE SULFATE (PF) 2 MG/ML IV SOLN
2.0000 mg | INTRAVENOUS | Status: DC | PRN
  Administered 2024-01-30 – 2024-02-13 (×10): 2 mg via INTRAVENOUS
  Filled 2024-01-30 (×10): qty 1

## 2024-01-30 MED ORDER — TRAVASOL 10 % IV SOLN
INTRAVENOUS | Status: AC
  Filled 2024-01-30: qty 1080

## 2024-01-30 NOTE — Progress Notes (Signed)
 Progress Note     Subjective: Patient reports some sweating and aches overnight. He states that this has resolved. Patient reports continued epigastric pain that is manageable. Denies nausea currently. Denies vomiting. Reports that his last bowel movement was around 8/15. Reports flatulence.   ROS  All negative with the exception of above.  Objective: Vital signs in last 24 hours: Temp:  [97.7 F (36.5 C)-99.3 F (37.4 C)] 98.3 F (36.8 C) (08/21 0806) Pulse Rate:  [74-86] 74 (08/21 0806) Resp:  [17-18] 18 (08/21 0806) BP: (113-126)/(72-87) 113/73 (08/21 0806) SpO2:  [97 %-100 %] 97 % (08/21 0806) Last BM Date : 01/24/24  Intake/Output from previous day: 08/20 0701 - 08/21 0700 In: 2405.6 [I.V.:197.7; NG/GT:2207.8] Out: -  Intake/Output this shift: No intake/output data recorded.  PE: General: Pleasant male who is laying in bed in NAD. HEENT: Feeding tube in place and visible at nare. Lungs: Respiratory effort nonlabored Abd: Soft and ND. Mild tenderness to palpation of epigastric region. No rebound tenderness or guarding. Psych: A&Ox3 with an appropriate affect.    Lab Results:  Recent Labs    01/30/24 0500  WBC 5.5  HGB 9.9*  HCT 29.6*  PLT 252   BMET Recent Labs    01/29/24 0209 01/30/24 0500  NA 137 140  K 3.7 3.7  CL 103 102  CO2 27 25  GLUCOSE 99 123*  BUN 7 10  CREATININE 0.78 0.69  CALCIUM  8.6* 8.6*   PT/INR Recent Labs    01/29/24 0209 01/30/24 0500  LABPROT 18.0* 19.3*  INR 1.4* 1.5*   CMP     Component Value Date/Time   NA 140 01/30/2024 0500   NA 142 08/30/2021 1430   K 3.7 01/30/2024 0500   CL 102 01/30/2024 0500   CO2 25 01/30/2024 0500   GLUCOSE 123 (H) 01/30/2024 0500   BUN 10 01/30/2024 0500   BUN 13 08/30/2021 1430   CREATININE 0.69 01/30/2024 0500   CALCIUM  8.6 (L) 01/30/2024 0500   PROT 6.3 (L) 01/30/2024 0500   ALBUMIN  3.2 (L) 01/30/2024 0500   AST 15 01/30/2024 0500   ALT 15 01/30/2024 0500   ALKPHOS 85  01/30/2024 0500   BILITOT 0.6 01/30/2024 0500   GFRNONAA >60 01/30/2024 0500   Lipase     Component Value Date/Time   LIPASE 344 (H) 01/27/2024 0229       Studies/Results: US  EKG SITE RITE Result Date: 01/28/2024 If Site Rite image not attached, placement could not be confirmed due to current cardiac rhythm.   Anti-infectives: Anti-infectives (From admission, onward)    Start     Dose/Rate Route Frequency Ordered Stop   01/18/24 1230  rifaximin  (XIFAXAN ) tablet 550 mg  Status:  Discontinued        550 mg Oral 3 times daily 01/18/24 1132 01/26/24 1323        Assessment/Plan Acute on chronic pancreatitis Calcifications of head of the pancreas Pancreatic pseudocyst Gastric pneumatosis -MRCP completed 8/14 -Most recent imaging includes: CT from 8/18 showed gastric pneumatosis. Additionally dilation of pancreatic duct noted measuring 12 mm diameter. The fluid collection identified on previous MRI between the pancreatic tail and lesser curvature of the stomach measured 5.7 x 2.0 cm. Bilobed fluid collection anterior to the pancreatic head increased. Scattered tiny foci of extraluminal gas in the anterior abdomen. The volume of this trace gas appear stable to minimally decreased in the interval since prior CT. One collection is seen just deep to the right rectus musculature  on axial 93/5. This was present on the previous CT and may be within the preperitoneal space as opposed to truly intraperitoneal in location although intraperitoneal gas is a possibility. Another collection is seen along the left rectus musculature but appears to be within the left rectus sheath. Both of these findings were present previously. Trace free fluid in the pelvis. -DG Abd Portable 1V on 8/18 showing feeding tube tip is in the distal duodenum at the ligament of Treitz. -Octreotide  was discontinued 8/18. -WBC 5.5. HGB 9.9 from 10.6 -Afebrile. Vitals stable. -No acute surgical intervention recommended at  this time. Nutrition needing to be optimized.  -Patient not able to tolerate NGT placement. If has intractable nausea or vomiting, would recommend replacement. Continue NPO.  -Will continue to monitor. CT imaging to be completed today to revaluate pneumatosis. -Surgery will continue to follow.     FEN: NPO; NGT if nausea/vomiting; TPN initiated 8/20; IVF per hospital team VTE: Coumadin /Heparin  ID: None currently    LOS: 19 days   I reviewed specialist notes, hospitalist notes, last 24 h vitals and pain scores, last 48 h intake and output, last 24 h labs and trends, and last 24 h imaging results.  This care required moderate level of medical decision making.    Marjorie Carlyon Favre, [facility] Surgery 01/30/2024, 8:56 AM Please see Amion for pager number during day hours 7:00am-4:30pm

## 2024-01-30 NOTE — Progress Notes (Signed)
 Patient was taken for the CT at 1330 via wheel chair. TPN is being continued.

## 2024-01-30 NOTE — Progress Notes (Signed)
 Patient is back after the CT

## 2024-01-30 NOTE — Progress Notes (Signed)
Incentive spirometry introduced with teach back 

## 2024-01-30 NOTE — Progress Notes (Signed)
 PHARMACY - ANTICOAGULATION CONSULT NOTE  Pharmacy Consult for Warfarin Indication: On-X mechanical AVR  Allergies  Allergen Reactions   Amoxicillin Hives    Patient Measurements: Height: 5' 11 (180.3 cm) Weight: 68.8 kg (151 lb 10.8 oz) IBW/kg (Calculated) : 75.3 HEPARIN  DW (KG): 71  Vital Signs: Temp: 98.5 F (36.9 C) (08/20 2015) BP: 126/87 (08/20 2015) Pulse Rate: 86 (08/20 2015)  Labs: Recent Labs    01/28/24 0413 01/29/24 0209 01/29/24 2112 01/30/24 0500  HGB  --   --   --  9.9*  HCT  --   --   --  29.6*  PLT  --   --   --  252  LABPROT 24.2* 18.0*  --  19.3*  INR 2.1* 1.4*  --  1.5*  HEPARINUNFRC  --   --  0.14* 0.23*  CREATININE  --  0.78  --   --     Estimated Creatinine Clearance: 127.8 mL/min (by C-G formula based on SCr of 0.78 mg/dL).  Assessment: 33 yo M presenting with pancreatitis. Warfarin PTA regimen is 6 mg Monday and Thursday and 4.5 mg all other days (total weekly dose: 34.5 mg).  Of note, patient started rifaximin  8/9 which may decrease INR.   8/20 AM: Per [facility] note, no surgical intervention planned. TPN to start today. Predictable INR dropfollowing multiple held doses, just slightly below goal at 1.4.   Initial heparin  level low at 0.14.  No overt bleeding or complications noted.  No known issues with IV infusion.  8/21 AM update:  Heparin  level sub-therapeutic INR technically back at goal, possible DC heparin  later today, or ?wait until INR is therapeutic x 24 hours  Goal of Therapy:  Heparin  level 0.3-0.7 units/mL INR 1.5-2.0 Monitor platelets by anticoagulation protocol: Yes   Plan:  Increase IV heparin  to 1400 units/hr Repeat heparin  level in 6-8 hrs Daily heparin  level and CBC.  Lynwood Mckusick, PharmD, BCPS Clinical Pharmacist Phone: [phone]

## 2024-01-30 NOTE — Progress Notes (Signed)
 Daily Progress Note  DOA: 01/11/2024 Hospital Day: 20  Cc: acute on chronic pancreatitis with new pseudocyst  ASSESSMENT    Patient Profile: 33 y.o. year old male with a past medical history including but not necessarily limited to chronic pancreatitis, history of aortic valve replacement requiring chronic anticoagulation, SIBO.   Acute on chronic pancreatitis complicated by acute fluid collections / pseudocysts and more recent development of gastric pneumatosis.  After discussion of the risks and benefits of different nutrition options, plan is to proceed with TPN for now due to concurrent pancreatitis and gastric pneumatosis. Surgery recommending bowel rest. Patient was not able to tolerate NGT placement for gastric decompression. Will follow up results of repeat CT A/P that is being done today. Will await surgery recommendations regarding NPO status  Weight loss and malnutrition secondary to above  AVR, chronically anticoagulated with warfarin  SIBO Positive outpatient SIBO test, s/p course of rifaximin  here in the hospital    PLAN   - Continue NPO status - Continue pain control - Continue TPN - Follow up results of CT A/P   Subjective   Patient felt like he had a head cold after being started on TPN but denies fevers. This sensation had resolved by this morning. Ab pain is stable.   Objective   GI Studies:    Recent Labs    01/30/24 0500  WBC 5.5  HGB 9.9*  HCT 29.6*  MCV 87.1  PLT 252   No results for input(s): FOLATE, VITAMINB12, FERRITIN, TIBC, IRONPCTSAT in the last 72 hours. Recent Labs    01/29/24 0209 01/30/24 0500  NA 137 140  K 3.7 3.7  CL 103 102  CO2 27 25  GLUCOSE 99 123*  BUN 7 10  CREATININE 0.78 0.69  CALCIUM  8.6* 8.6*   Recent Labs    01/29/24 0209 01/30/24 0500  PROT 6.4* 6.3*  ALBUMIN  3.2* 3.2*  AST 14* 15  ALT 16 15  ALKPHOS 83 85  BILITOT 0.5 0.6      Imaging:  US  EKG SITE RITE If Site Rite image  not attached, placement could not be confirmed due to  current cardiac rhythm.     Scheduled inpatient medications:   aspirin  EC  81 mg Oral Daily   Chlorhexidine  Gluconate Cloth  6 each Topical Daily   insulin  aspart  0-9 Units Subcutaneous Q6H   metoprolol  tartrate  2.5 mg Intravenous Q6H   pantoprazole  (PROTONIX ) IV  40 mg Intravenous Q12H   sodium chloride  flush  3 mL Intravenous Q12H   warfarin  4.5 mg Per Tube ONCE-1600   Warfarin - Pharmacist Dosing Inpatient   Does not apply q1600   Continuous inpatient infusions:   lactated ringers  Stopped (01/29/24 1719)   TPN ADULT (ION) 45 mL/hr at 01/29/24 1807   TPN ADULT (ION)     PRN inpatient medications: albuterol , alum & mag hydroxide-simeth, HYDROmorphone  (DILAUDID ) injection, hydrOXYzine , morphine  injection, ondansetron  **OR** ondansetron  (ZOFRAN ) IV, oxyCODONE -acetaminophen , phenol, sodium chloride  flush  Vital signs in last 24 hours: Temp:  [97.9 F (36.6 C)-99.3 F (37.4 C)] 98.3 F (36.8 C) (08/21 0806) Pulse Rate:  [74-86] 75 (08/21 1220) Resp:  [17-18] 18 (08/21 0806) BP: (113-126)/(71-87) 124/71 (08/21 1220) SpO2:  [97 %-99 %] 97 % (08/21 0806) Last BM Date : 01/24/24  Intake/Output Summary (Last 24 hours) at 01/30/2024 1500 Last data filed at 01/29/2024 1808 Gross per 24 hour  Intake 2405.57 ml  Output --  Net 2405.57 ml  Intake/Output from previous day: 08/20 0701 - 08/21 0700 In: 2405.6 [I.V.:197.7; NG/GT:2207.8] Out: -  Intake/Output this shift: No intake/output data recorded.   Physical Exam:  General: Alert male in NAD Heart:  Regular rate and rhythm.  Pulmonary: Normal respiratory effort Abdomen: Soft, nondistended, nontender. Normal bowel sounds. Extremities: No lower extremity edema  Neurologic: Alert and oriented Psych: Pleasant. Cooperative   LOS: 19 days   Rosario JAYSON Kidney ,MD 01/30/2024, 3:00 PM

## 2024-01-30 NOTE — Progress Notes (Signed)
 PHARMACY - ANTICOAGULATION CONSULT NOTE  Pharmacy Consult for Warfarin Indication: On-X mechanical AVR  Allergies  Allergen Reactions   Amoxicillin Hives    Patient Measurements: Height: 5' 11 (180.3 cm) Weight: 68.8 kg (151 lb 10.8 oz) IBW/kg (Calculated) : 75.3 HEPARIN  DW (KG): 71  Vital Signs: Temp: 98.3 F (36.8 C) (08/21 0806) Temp Source: Oral (08/21 0806) BP: 113/73 (08/21 0806) Pulse Rate: 74 (08/21 0806)  Labs: Recent Labs    01/28/24 0413 01/29/24 0209 01/29/24 2112 01/30/24 0500  HGB  --   --   --  9.9*  HCT  --   --   --  29.6*  PLT  --   --   --  252  LABPROT 24.2* 18.0*  --  19.3*  INR 2.1* 1.4*  --  1.5*  HEPARINUNFRC  --   --  0.14* 0.23*  CREATININE  --  0.78  --  0.69    Estimated Creatinine Clearance: 127.8 mL/min (by C-G formula based on SCr of 0.69 mg/dL).  Assessment: 33 yo M presenting with pancreatitis. Warfarin PTA regimen is 6 mg Monday and Thursday and 4.5 mg all other days (total weekly dose: 34.5 mg).  Of note, patient started rifaximin  8/9 which may decrease INR.    8/21 AM update: INR back at goal- discussed with provider and OK to discontinue heparin  gtt and continue warfarin per tube.   Goal of Therapy:  INR 1.5-2.0 Monitor platelets by anticoagulation protocol: Yes   Plan:  Warfarin 4.5 mg PT x1  Daily INR,CBC      Massie Fila, PharmD Clinical Pharmacist  01/30/2024 11:44 AM ;

## 2024-01-30 NOTE — Progress Notes (Signed)
 PHARMACY - TOTAL PARENTERAL NUTRITION CONSULT NOTE   Indication: severe pancreatitis and anticipated prolong NPO status  Patient Measurements: Height: 5' 11 (180.3 cm) Weight: 68.8 kg (151 lb 10.8 oz) IBW/kg (Calculated) : 75.3 TPN AdjBW (KG): 71 Body mass index is 21.15 kg/m. Usual Weight: 66 kg  Assessment:  33 y.o. year old male with a past medical history significant for chronic pancreatitis, history of aortic valve replacement requiring chronic anticoagulation, SIBO s/p course of rifaximin . Surgery has recommended postpyloric feedings be held due to the presence of pneumatosis. Mansouraty who recommends against any endoscopic intervention at this time due to gastric pneumatosis and immature wall of the pancreas fluid collection. PEJ to be considered when enteral feeding can be safely resumed. Pharmacy consulted to initiate TPN in the interim.  Per patient, had mild flushing and headache overnight, resolved without needing interventions. No issues with nausea/vomiting. No BM since 8/15 per charting and confirmation by patient. Still good UOP per patient.   Glucose / Insulin : no hx DM, CBGs < 150 with no insulin  needed Electrolytes: K 3.7, Phos 4.0, Mg 1.9, others wnl Renal: Scr < 1, BUN 10 Hepatic: AST/ALT wnl, alb 3.2, Tbili 0.6, TG 96 (recently elevated 08/2023) Intake / Output; MIVF: LBM 8/15, UOP not charted  GI Imaging: 8/02 CT: severe sequelae of advanced pancreatitis 8/14 MRCP: acute pancreatitis, severe pancreatic ductal dilatation, concerning for ulceration to the bowel lumen 8/18 CT: gas density between the intraluminal gastric fluid and the wall of the stomach in the posterior and lateral portions of the fundus, concerning for pneumatosis  GI Surgeries / Procedures:  None since starting TPN  Central access: (01/29/24)  TPN start date: (01/29/24)  Nutritional Goals: Goal TPN rate is 90 mL/hr (provides 108 g of protein and 2120 kcals per day)  RD  Assessment: Estimated Needs Total Energy Estimated Needs: 2000-2350 kcal Total Protein Estimated Needs: 105-140 g Total Fluid Estimated Needs: >/=2L  Current Nutrition:  NPO  Plan:  Increase TPN to 90 mL/hr at 1800 to provide 100% nutritional needs Electrolytes in TPN: Na 42mEq/L, K 70mEq/L, Ca 23mEq/L, Mg 22mEq/L, and Phos 15mmol/L. Cl:Ac 1:1 Add standard MVI and trace elements to TPN Continue Sensitive q6h SSI at [facility] and adjust as needed  MIVF to 10 mL/hr at 1800  Monitor TPN labs on Mon/Thurs, daily PRN  Thank you for allowing pharmacy to be a part of this patient's care.  Shelba Collier, PharmD, BCPS Clinical Pharmacist

## 2024-01-30 NOTE — Plan of Care (Signed)

## 2024-01-30 NOTE — TOC Progression Note (Signed)
 Transition of Care [facility]) - Progression Note    Patient Details  Name: [name] [name] MRN: [id number] Date of Birth: [birth year]  Transition of Care [facility]) CM/SW Contact  [name] JONELLE Joe, RN Phone Number: 01/30/2024, 4:07 PM  Clinical Narrative:    CM will continue to follow the patient as he progresses.  Surgery continues to follow the patient due to gastric pneumatosis.  Patient is currently receiving TPN via PICC line.  Holley Herring, RNCM with [employer] will continue to follow the patient for possible TPN needs for home  - pending determination through GI MD at this time.  Patient's mother states that patient has completed HCPOA paperwork that needs to be notarized.  Chaplain consult placed to assist.                     Expected Discharge Plan and Services                                               Social Drivers of Health (SDOH) Interventions SDOH Screenings   Food Insecurity: No Food Insecurity (01/11/2024)  Housing: Low Risk  (01/11/2024)  Transportation Needs: No Transportation Needs (01/11/2024)  Utilities: Not At Risk (01/11/2024)  Depression (PHQ2-9): Medium Risk (09/09/2023)  Financial Resource Strain: Low Risk  (04/25/2023)  Physical Activity: Sufficiently Active (04/25/2023)  Social Connections: Unknown (04/25/2023)  Stress: No Stress Concern Present (04/25/2023)  Tobacco Use: Low Risk  (01/20/2024)    Readmission Risk Interventions    01/29/2024   12:53 PM 01/15/2024   11:03 AM  Readmission Risk Prevention Plan  Post Dischage Appt  Complete  Medication Screening  Complete  Transportation Screening Complete Complete  PCP or Specialist Appt within 5-7 Days Complete   Home Care Screening Complete   Medication Review (RN CM) Complete

## 2024-01-30 NOTE — Plan of Care (Signed)
  Patient has been admitted for acute or chronic pancreatitis complicated due to pancreatic pseudocyst.  Patient is stating that we are doing for pain management for him.  Currently on Percocet every 4 hours as needed for moderate to severe pain is not sufficient for his pain control.  Ordering morphine  2 mg every 2 hour as needed for breakthrough pain.

## 2024-01-30 NOTE — Plan of Care (Signed)
  Problem: Education: Goal: Knowledge of General Education information will improve Description: Including pain rating scale, medication(s)/side effects and non-pharmacologic comfort measures Outcome: Progressing   Problem: Clinical Measurements: Goal: Respiratory complications will improve Outcome: Progressing   Problem: Clinical Measurements: Goal: Cardiovascular complication will be avoided Outcome: Progressing   Problem: Activity: Goal: Risk for activity intolerance will decrease Outcome: Progressing   Problem: Nutrition: Goal: Adequate nutrition will be maintained Outcome: Progressing   Problem: Coping: Goal: Level of anxiety will decrease Outcome: Progressing   Problem: Elimination: Goal: Will not experience complications related to urinary retention Outcome: Progressing   Problem: Pain Managment: Goal: General experience of comfort will improve and/or be controlled Outcome: Progressing   Problem: Safety: Goal: Ability to remain free from injury will improve Outcome: Progressing   Problem: Skin Integrity: Goal: Risk for impaired skin integrity will decrease Outcome: Progressing

## 2024-01-30 NOTE — Progress Notes (Signed)
 PROGRESS NOTE    [name] [name]  FMW:[id number] DOB: 08/01/1990 DOA: 01/11/2024 PCP: Lucius Krabbe, NP   Brief Narrative:    33 year old male with medical history significant for hypertension, s/p mechanical aortic valve replacement 09/27/21 on chronic warfarin anticoagulation, recurrent pancreatitis with multiple hospitalizations, asthma, GERD, prior history of alcohol use disorder (abstinent for 10 months PTA), presented to the hospital on 01/11/2024 with complaints of abdominal pain and admitted for AKI, pancreatitis with pseudocyst.  GI was consulted, patient was treated supportively with n.p.o., core track with postpyloric feeding.  Core track was dislodged on 8/18, briefly treated with liquid diet.  CT A/P on 8/18 showed gastric pneumatosis, general surgery was consulted, octreotide  stopped, patient unable to tolerate NG tube placement, made n.p.o. again, PICC line placed and starting TPN on 8/20.  Currently awaiting repeat CT scan per general surgery.  Assessment & Plan:   Principal Problem:   Acute on chronic pancreatitis [facility]) Active Problems:   Pseudocyst of pancreas   S/P AVR (aortic valve replacement) and aortoplasty   Essential hypertension   On pre-exposure prophylaxis for HIV   History of asthma   History of alcohol abuse   Protein-calorie malnutrition, severe   Intraabdominal fluid collection  Assessment and Plan:   Acute on chronic pancreatitis, recurrent pancreatitis Acute fluid collection/pseudocyst Gastric pneumatosis As per chart review, EUS by Dr. Wilhelmenia in 07/2023 that showed cystic lesion in the head of the pancreas consistent with a pseudocyst and a 5 mm stone within the pancreatic duct at the junction of the head/neck.  Cytology of the cyst was benign.  He was scheduled for an ERCP with attempted stone extraction in September with Dr. Wilhelmenia but now admitted with abdominal pain. [facility] was consulted, patient was treated  supportively with n.p.o., IVF, core track with postpyloric feeding (initially started on 8/6).  Core track was dislodged on 8/18, briefly treated with liquid diet and core track was replaced.   CT A/P on 8/18 showed gastric pneumatosis, general surgery was consulted, octreotide  stopped (to avoid dysmotility), patient unable to tolerate NG tube placement, made n.p.o. again/tube feeds held, gentle IVF continuing, PICC line placed and starting TPN on 8/20. As per [facility] follow-up, plan for bowel rest today, tentative plan to repeat CT abdomen today.  Defer timing of resumption of postpyloric feeding tube & PEJ placement to [facility].   S/p mechanical aortic valve replacement and aortoplasty INR is 1.4, subtherapeutic (goal INR 1.5-2) Continue warfarin per pharmacy and will initiate IV heparin  bridge until INR is therapeutic Continue aspirin  81 mg daily. Follows with Dr. Wendel, Cardiology and the Coumadin  Clinic   Essential hypertension Controlled on metoprolol  IV 2.5 mg every 6 hours.   Alcohol use disorder, in remission Stable   Asthma Stable   Anemia of Chronic Disease Stable.   Small intestinal bacterial overgrowth (SIBO) Completed 14 days of rifaximin .  Resolved.   Nutrition Status: Nutrition Problem: Severe Malnutrition Etiology: vomiting, chronic illness, altered GI function Signs/Symptoms: moderate fat depletion, severe muscle depletion Interventions: Refer to RD note for recommendations Agree with registered dietitian evaluation and management recommendations.   Body mass index is 21.15 kg/m.    DVT prophylaxis: Warfarin Code Status: Full Family Communication: None at bedside Disposition Plan:  Status is: Inpatient Remains inpatient appropriate because: Need for IV medications.   Consultants:  GI General surgery  Procedures:  PICC line placement 8/20  Antimicrobials:  Anti-infectives (From admission, onward)    Start     Dose/Rate Route Frequency  Ordered Stop    01/18/24 1230  rifaximin  (XIFAXAN ) tablet 550 mg  Status:  Discontinued        550 mg Oral 3 times daily 01/18/24 1132 01/26/24 1323       Subjective: Patient seen and evaluated today with no new acute complaints or concerns. No acute concerns or events noted overnight.  He currently complains of thirst along with some mild continued epigastric abdominal pain.  He denies any nausea or vomiting.  Objective: Vitals:   01/29/24 2015 01/30/24 0620 01/30/24 0806 01/30/24 1220  BP: 126/87 113/78 113/73 124/71  Pulse: 86 76 74 75  Resp:   18   Temp: 98.5 F (36.9 C) 99.3 F (37.4 C) 98.3 F (36.8 C)   TempSrc:   Oral   SpO2: 97% 99% 97%   Weight:      Height:        Intake/Output Summary (Last 24 hours) at 01/30/2024 1350 Last data filed at 01/29/2024 1808 Gross per 24 hour  Intake 2405.57 ml  Output --  Net 2405.57 ml   Filed Weights   01/21/24 0815 01/23/24 0500 01/27/24 0500  Weight: 66 kg 66 kg 68.8 kg    Examination:  General exam: Appears calm and comfortable  Respiratory system: Clear to auscultation. Respiratory effort normal. Cardiovascular system: S1 & S2 heard, RRR.  Gastrointestinal system: Abdomen is soft Central nervous system: Alert and awake Extremities: No edema Skin: No significant lesions noted, PICC line present to right upper extremity. Psychiatry: Flat affect.    Data Reviewed: I have personally reviewed following labs and imaging studies  CBC: Recent Labs  Lab 01/25/24 0418 01/27/24 0229 01/30/24 0500  WBC 6.2 7.6 5.5  NEUTROABS 3.3  --   --   HGB 10.5* 10.6* 9.9*  HCT 31.3* 31.7* 29.6*  MCV 86.5 87.3 87.1  PLT 264 354 252   Basic Metabolic Panel: Recent Labs  Lab 01/25/24 0418 01/27/24 0229 01/29/24 0209 01/30/24 0500  NA 138 138 137 140  K 3.9 4.0 3.7 3.7  CL 99 99 103 102  CO2 29 30 27 25   GLUCOSE 104* 104* 99 123*  BUN 11 10 7 10   CREATININE 0.70 0.86 0.78 0.69  CALCIUM  8.8* 9.3 8.6* 8.6*  MG  --   --  2.0 1.9  PHOS   --   --  3.6 4.0   GFR: Estimated Creatinine Clearance: 127.8 mL/min (by C-G formula based on SCr of 0.69 mg/dL). Liver Function Tests: Recent Labs  Lab 01/25/24 0418 01/27/24 0229 01/29/24 0209 01/30/24 0500  AST 21 17 14* 15  ALT 26 22 16 15   ALKPHOS 68 72 83 85  BILITOT 0.4 0.5 0.5 0.6  PROT 6.7 6.6 6.4* 6.3*  ALBUMIN  3.4* 3.3* 3.2* 3.2*   Recent Labs  Lab 01/24/24 0256 01/25/24 0418 01/27/24 0229  LIPASE 451* 142* 344*   No results for input(s): AMMONIA in the last 168 hours. Coagulation Profile: Recent Labs  Lab 01/26/24 0355 01/27/24 0229 01/28/24 0413 01/29/24 0209 01/30/24 0500  INR 2.0* 2.4* 2.1* 1.4* 1.5*   Cardiac Enzymes: No results for input(s): CKTOTAL, CKMB, CKMBINDEX, TROPONINI in the last 168 hours. BNP (last 3 results) No results for input(s): PROBNP in the last 8760 hours. HbA1C: No results for input(s): HGBA1C in the last 72 hours. CBG: Recent Labs  Lab 01/29/24 0437 01/29/24 1514 01/29/24 1832 01/30/24 0620 01/30/24 1153  GLUCAP 88 80 75 117* 100*   Lipid Profile: Recent Labs    01/29/24  0930  TRIG 96   Thyroid Function Tests: No results for input(s): TSH, T4TOTAL, FREET4, T3FREE, THYROIDAB in the last 72 hours. Anemia Panel: No results for input(s): VITAMINB12, FOLATE, FERRITIN, TIBC, IRON, RETICCTPCT in the last 72 hours. Sepsis Labs: No results for input(s): PROCALCITON, LATICACIDVEN in the last 168 hours.  No results found for this or any previous visit (from the past 240 hours).       Radiology Studies: US  EKG SITE RITE Result Date: 01/28/2024 If Site Rite image not attached, placement could not be confirmed due to current cardiac rhythm.       Scheduled Meds:  aspirin  EC  81 mg Oral Daily   Chlorhexidine  Gluconate Cloth  6 each Topical Daily   insulin  aspart  0-9 Units Subcutaneous Q6H   metoprolol  tartrate  2.5 mg Intravenous Q6H   pantoprazole  (PROTONIX ) IV  40 mg  Intravenous Q12H   sodium chloride  flush  3 mL Intravenous Q12H   warfarin  4.5 mg Per Tube ONCE-1600   Warfarin - Pharmacist Dosing Inpatient   Does not apply q1600   Continuous Infusions:  lactated ringers  Stopped (01/29/24 1719)   TPN ADULT (ION) 45 mL/hr at 01/29/24 1807   TPN ADULT (ION)       LOS: 19 days    Time spent: 55 minutes    Sarrah Fiorenza JONETTA Fairly, DO [facility]  If 7PM-7AM, please contact night-coverage [email or web address] 01/30/2024, 1:50 PM

## 2024-01-30 NOTE — Progress Notes (Signed)
 Nutrition Follow-up  DOCUMENTATION CODES:   Severe malnutrition in context of chronic illness  INTERVENTION:   Per Pharmacy: Increase TPN to 90 mL/hr at 1800 to provide 100% nutritional needs Electrolytes in TPN: Na 62mEq/L, K 50mEq/L, Ca 58mEq/L, Mg 62mEq/L, and Phos 15mmol/L. Cl:Ac 1:1 Add standard MVI and trace elements to TPN Continue Sensitive q6h SSI at [facility] and adjust as needed  MIVF to 10 mL/hr at 1800  Monitor TPN labs on Mon/Thurs, daily PRN     Goal TPN rate is 90 mL/hr (provides 108 g of protein and 2120 kcals per day)  -Plan for potential PEJ once able to use GI again   NUTRITION DIAGNOSIS:   Severe Malnutrition related to vomiting, chronic illness, altered GI function as evidenced by moderate fat depletion, severe muscle depletion.  Ongoing  GOAL:   Patient will meet greater than or equal to 90% of their needs  Progressing  MONITOR:   PO intake, Weight trends, TF tolerance, Supplement acceptance, Diet advancement, Labs, Skin  REASON FOR ASSESSMENT:   New TF, Diagnosis    ASSESSMENT:   Hx hypertension, bicuspid aortic valve with severe aortic insufficiency and moderate aortic stenosis s/p mechanical aortic valve replacement in arthroplasty on 09/27/2021 on chronic anticoagulation, recurrent pancreatitis, asthma, GERD, and prior history of alcohol abuse presents with abdominal pain.  Spoke to pt at bedside. Pt denies n/v/c/d or increased abdominal pain at this time. Last BM 8/15. Discussed plan moving forward of TPN, repeat CT, then once GI approves discussing PEJ tube. Pt to get CT today, will monitor for results. Pt to increase TPN to goal rate today. Phos, Mg, Potassium all WNL. Pt expresses frustration at not being able to consume any liquids PO, hopeful for ability to drink water  soon. Pt denies questions/concerns at this time, will continue to monitor, RDN available prn.   Labs BG 75-123 Calcium  8.6 Albumin  3.2 Lipase 344 H/H  9.9/29.6  Medications  aspirin  EC  81 mg Oral Daily   Chlorhexidine  Gluconate Cloth  6 each Topical Daily   insulin  aspart  0-9 Units Subcutaneous Q6H   metoprolol  tartrate  2.5 mg Intravenous Q6H   pantoprazole  (PROTONIX ) IV  40 mg Intravenous Q12H   sodium chloride  flush  3 mL Intravenous Q12H   warfarin  4.5 mg Per Tube ONCE-1600   Warfarin - Pharmacist Dosing Inpatient   Does not apply q1600     NUTRITION - FOCUSED PHYSICAL EXAM:  Flowsheet Row Most Recent Value  Orbital Region Moderate depletion  Upper Arm Region Moderate depletion  Thoracic and Lumbar Region Moderate depletion  Buccal Region Moderate depletion  Temple Region Moderate depletion  Clavicle Bone Region Severe depletion  Clavicle and Acromion Bone Region Severe depletion  Scapular Bone Region Moderate depletion  Dorsal Hand Moderate depletion  Patellar Region Moderate depletion  Anterior Thigh Region Moderate depletion  Posterior Calf Region Moderate depletion  Edema (RD Assessment) None  Hair Reviewed  Eyes Reviewed  Mouth Reviewed  Skin Reviewed  Nails Reviewed    Diet Order:   Diet Order             Diet NPO time specified  Diet effective now                   EDUCATION NEEDS:   Education needs have been addressed  Skin:  Skin Assessment: Reviewed RN Assessment  Last BM:  8/12 type1  Height:   Ht Readings from Last 1 Encounters:  01/11/24 5' 11 (1.803 m)  Weight:   Wt Readings from Last 1 Encounters:  01/27/24 68.8 kg    BMI:  Body mass index is 21.15 kg/m.  Estimated Nutritional Needs:   Kcal:  2000-2350 kcal  Protein:  105-140 g  Fluid:  >/=2L  Gavina Dildine Daml-Budig, RDN, LDN Registered Dietitian Nutritionist RD Inpatient Contact Info in [place]

## 2024-01-31 DIAGNOSIS — E43 Unspecified severe protein-calorie malnutrition: Secondary | ICD-10-CM | POA: Diagnosis not present

## 2024-01-31 DIAGNOSIS — K859 Acute pancreatitis without necrosis or infection, unspecified: Secondary | ICD-10-CM | POA: Diagnosis not present

## 2024-01-31 DIAGNOSIS — K863 Pseudocyst of pancreas: Secondary | ICD-10-CM | POA: Diagnosis not present

## 2024-01-31 DIAGNOSIS — R188 Other ascites: Secondary | ICD-10-CM | POA: Diagnosis not present

## 2024-01-31 DIAGNOSIS — K861 Other chronic pancreatitis: Secondary | ICD-10-CM | POA: Diagnosis not present

## 2024-01-31 LAB — PROTIME-INR
INR: 1.2 (ref 0.8–1.2)
Prothrombin Time: 15.7 s — ABNORMAL HIGH (ref 11.4–15.2)

## 2024-01-31 LAB — CBC
HCT: 31.4 % — ABNORMAL LOW (ref 39.0–52.0)
Hemoglobin: 10.2 g/dL — ABNORMAL LOW (ref 13.0–17.0)
MCH: 28 pg (ref 26.0–34.0)
MCHC: 32.5 g/dL (ref 30.0–36.0)
MCV: 86.3 fL (ref 80.0–100.0)
Platelets: 272 K/uL (ref 150–400)
RBC: 3.64 MIL/uL — ABNORMAL LOW (ref 4.22–5.81)
RDW: 13.5 % (ref 11.5–15.5)
WBC: 5 K/uL (ref 4.0–10.5)
nRBC: 0 % (ref 0.0–0.2)

## 2024-01-31 LAB — GLUCOSE, CAPILLARY
Glucose-Capillary: 136 mg/dL — ABNORMAL HIGH (ref 70–99)
Glucose-Capillary: 126 mg/dL — ABNORMAL HIGH (ref 70–99)
Glucose-Capillary: 137 mg/dL — ABNORMAL HIGH (ref 70–99)
Glucose-Capillary: 134 mg/dL — ABNORMAL HIGH (ref 70–99)
Glucose-Capillary: 154 mg/dL — ABNORMAL HIGH (ref 70–99)

## 2024-01-31 LAB — BASIC METABOLIC PANEL WITH GFR
Anion gap: 12 (ref 5–15)
BUN: 9 mg/dL (ref 6–20)
CO2: 26 mmol/L (ref 22–32)
Calcium: 8.8 mg/dL — ABNORMAL LOW (ref 8.9–10.3)
Chloride: 103 mmol/L (ref 98–111)
Creatinine, Ser: 0.6 mg/dL — ABNORMAL LOW (ref 0.61–1.24)
GFR, Estimated: >60 mL/min (ref >60)
Glucose, Bld: 139 mg/dL — ABNORMAL HIGH (ref 70–99)
Potassium: 3.8 mmol/L (ref 3.5–5.1)
Sodium: 141 mmol/L (ref 135–145)

## 2024-01-31 LAB — MAGNESIUM: Magnesium: 2 mg/dL (ref 1.7–2.4)

## 2024-01-31 LAB — PHOSPHORUS: Phosphorus: 4 mg/dL (ref 2.5–4.6)

## 2024-01-31 MED ORDER — ENOXAPARIN SODIUM 60 MG/0.6ML IJ SOSY
60.0000 mg | PREFILLED_SYRINGE | Freq: Two times a day (BID) | INTRAMUSCULAR | Status: DC
  Administered 2024-01-31 – 2024-02-04 (×8): 60 mg via SUBCUTANEOUS
  Filled 2024-01-31 (×8): qty 0.6

## 2024-01-31 MED ORDER — BOOST / RESOURCE BREEZE PO LIQD CUSTOM
1.0000 | Freq: Two times a day (BID) | ORAL | Status: DC
  Administered 2024-02-01: 1 via ORAL

## 2024-01-31 MED ORDER — POTASSIUM CHLORIDE 10 MEQ/50ML IV SOLN
10.0000 meq | INTRAVENOUS | Status: AC
  Administered 2024-01-31 (×2): 10 meq via INTRAVENOUS
  Filled 2024-01-31 (×2): qty 50

## 2024-01-31 MED ORDER — METOPROLOL SUCCINATE ER 25 MG PO TB24
12.5000 mg | ORAL_TABLET | Freq: Every day | ORAL | Status: DC
  Administered 2024-02-01 – 2024-02-07 (×7): 12.5 mg via ORAL
  Filled 2024-01-31 (×7): qty 1

## 2024-01-31 MED ORDER — WARFARIN SODIUM 5 MG PO TABS
5.0000 mg | ORAL_TABLET | Freq: Once | ORAL | Status: AC
  Administered 2024-01-31: 5 mg via ORAL
  Filled 2024-01-31: qty 1

## 2024-01-31 MED ORDER — PANCRELIPASE (LIP-PROT-AMYL) 36000-114000 UNITS PO CPEP
72000.0000 [IU] | ORAL_CAPSULE | Freq: Three times a day (TID) | ORAL | Status: DC
  Administered 2024-01-31 – 2024-02-17 (×14): 72000 [IU] via ORAL
  Filled 2024-01-31 (×23): qty 2

## 2024-01-31 MED ORDER — TRAVASOL 10 % IV SOLN
INTRAVENOUS | Status: AC
  Filled 2024-01-31: qty 1080

## 2024-01-31 NOTE — Plan of Care (Signed)

## 2024-01-31 NOTE — Progress Notes (Addendum)
 Daily Progress Note  DOA: 01/11/2024 Hospital Day: 21  Cc: acute on chronic pancreatitis with new pseudocyst   ASSESSMENT    Patient Profile: 33 y.o. year old male with a past medical history including but not necessarily limited to chronic pancreatitis, history of aortic valve replacement requiring chronic anticoagulation, SIBO. He has had a prolonged admission for complicated acute on chronic pancreatitis.   Acute on chronic pancreatitis complicated by acute fluid collections / pseudocysts and more recent development of gastric pneumatosis.  Started on TPN 8/20.  Repeat CT AP 8/21 showed resolution of gastric pneumatosis, stable multilocular cystic process anterior to the pancreatic head consistent with pseudocysts. Surgery cleared him to to resume tube feeds and started CLD.  TODAY:  Tolerated CLD x 2 meals. To try full liquids tonight. .    Weight loss and malnutrition secondary to above   AVR, chronically anticoagulated with warfarin   SIBO Positive outpatient SIBO test, s/p course of rifaximin  here in the hospital  Principal Problem:   Acute on chronic pancreatitis (HCC) Active Problems:   S/P AVR (aortic valve replacement) and aortoplasty   Essential hypertension   Pseudocyst of pancreas   On pre-exposure prophylaxis for HIV   History of asthma   History of alcohol abuse   Protein-calorie malnutrition, severe   Intraabdominal fluid collection    PLAN   Hopefully home soon on TPN with plans for outpatient surgical follow up to discuss J-tube placement  (or place inpatient it doesn't get discharged)  Subjective   Feels okay. Mother visiting. Tolerating clear liquids   Objective   GI Studies:    Recent Labs    01/30/24 0500 01/31/24 0422  WBC 5.5 5.0  HGB 9.9* 10.2*  HCT 29.6* 31.4*  MCV 87.1 86.3  PLT 252 272   No results for input(s): FOLATE, VITAMINB12, FERRITIN, TIBC, IRONPCTSAT in the last 72 hours. Recent Labs    01/29/24 0209  01/30/24 0500 01/31/24 0422  NA 137 140 141  K 3.7 3.7 3.8  CL 103 102 103  CO2 27 25 26   GLUCOSE 99 123* 139*  BUN 7 10 9   CREATININE 0.78 0.69 0.60*  CALCIUM  8.6* 8.6* 8.8*   Recent Labs    01/29/24 0209 01/30/24 0500  PROT 6.4* 6.3*  ALBUMIN  3.2* 3.2*  AST 14* 15  ALT 16 15  ALKPHOS 83 85  BILITOT 0.5 0.6    Imaging:  CT ABDOMEN PELVIS W CONTRAST EXAM: CT ABDOMEN AND PELVIS WITH CONTRAST 01/30/2024 01:52:00 PM  TECHNIQUE: CT of the abdomen and pelvis was performed with the administration of intravenous contrast. Multiplanar reformatted images are provided for review. Automated exposure control, iterative reconstruction, and/or weight-based adjustment of the mA/kV was utilized to reduce the radiation dose to as low as reasonably achievable.  COMPARISON: 01/27/2024  CLINICAL HISTORY: Gastric pneumatosis.  FINDINGS:  LOWER CHEST: Minimal dependent atelectasis at the left lung base.  LIVER: Normal size and contour.  GALLBLADDER AND BILE DUCTS: No wall thickening. No cholelithiasis. No biliary ductal dilatation.  SPLEEN: Normal size. No focal lesion.  PANCREAS: Scattered coarse calcifications in the pancreatic head and body as before. Dilated pancreatic duct. Multilocular cystic process anterior to the pancreatic head, abutting the gastric antrum, measured approximately 4.2 cm maximum in transverse diameter, relatively stable from prior study, consistent with pseudocysts.  ADRENAL GLANDS: Normal appearance. No mass.  KIDNEYS, URETERS AND BLADDER: No stones in the kidneys or ureters. No hydronephrosis. No perinephric or periureteral stranding. Urinary bladder  is unremarkable.  GI AND BOWEL: Stomach is decompressed. No pneumatosis. Normal appendix.  PERITONEUM AND RETROPERITONEUM: No ascites. No free air.  VASCULATURE: Tapered narrowing of the splenic vein in the midline. Moderate narrowing of the superior mesenteric vein near the portal -  splenic confluence without definite venous collateral filling.  LYMPH NODES: No lymphadenopathy.  REPRODUCTIVE ORGANS: No significant abnormality.  BONES AND SOFT TISSUES: Sternotomy wires. Feeding tube to the proximal jejunum. No acute osseous abnormality. No focal soft tissue abnormality.  IMPRESSION: 1. No evidence of gastric pneumatosis. 2. Stable multilocular cystic process anterior to the pancreatic head, consistent with pseudocysts. 3. Tapered narrowing of the splenic vein in the midline and moderate narrowing of the superior mesenteric vein near the portal-splenic confluence without definite venous collaterals.  Electronically signed by: Dayne Hassell MD 01/30/2024 04:40 PM EDT RP Workstation: [id number]     Scheduled inpatient medications:   aspirin  EC  81 mg Oral Daily   Chlorhexidine  Gluconate Cloth  6 each Topical Daily   enoxaparin  (LOVENOX ) injection  60 mg Subcutaneous BID   [START ON 02/01/2024] feeding supplement  1 Container Oral BID BM   insulin  aspart  0-9 Units Subcutaneous Q6H   lipase/protease/amylase  72,000 Units Oral TID AC   metoprolol  tartrate  2.5 mg Intravenous Q6H   pantoprazole  (PROTONIX ) IV  40 mg Intravenous Q12H   sodium chloride  flush  3 mL Intravenous Q12H   Warfarin - Pharmacist Dosing Inpatient   Does not apply q1600   Continuous inpatient infusions:   TPN ADULT (ION) 90 mL/hr at 01/31/24 0202   TPN ADULT (ION) 90 mL/hr at 01/31/24 1728   PRN inpatient medications: albuterol , alum & mag hydroxide-simeth, HYDROmorphone  (DILAUDID ) injection, hydrOXYzine , morphine  injection, ondansetron  **OR** ondansetron  (ZOFRAN ) IV, oxyCODONE -acetaminophen , phenol, sodium chloride  flush  Vital signs in last 24 hours: Temp:  [97.2 F (36.2 C)-98.6 F (37 C)] 98.2 F (36.8 C) (08/22 1728) Pulse Rate:  [72-81] 72 (08/22 1728) Resp:  [18] 18 (08/22 1502) BP: (109-128)/(73-87) 121/81 (08/22 1728) SpO2:  [98 %-100 %] 100 % (08/22 1728) Weight:  [66.5  kg] 66.5 kg (08/22 0500) Last BM Date : 01/25/24  Intake/Output Summary (Last 24 hours) at 01/31/2024 1740 Last data filed at 01/31/2024 0202 Gross per 24 hour  Intake 786.17 ml  Output 0 ml  Net 786.17 ml    Intake/Output from previous day: 08/21 0701 - 08/22 0700 In: 786.2 [I.V.:786.2] Out: 0  Intake/Output this shift: No intake/output data recorded.   Physical Exam:  General: Alert male in NAD Heart:  Regular rate and rhythm.  Pulmonary: Normal respiratory effort Abdomen: Soft, nondistended, nontender. Normal bowel sounds. Extremities: No lower extremity edema  Neurologic: Alert and oriented Psych: Pleasant. Cooperative     LOS: 20 days   Vina Dasen ,NP 01/31/2024, 5:40 PM  I personally saw the patient and performed a substantive portion of this encounter (>50% time spent), including a complete performance of at least one of the key components (MDM, Hx and/or Exam), in conjunction with the APP.  I agree with the APP's note, impression, and recommendations with additional input as follows.   CT scan yesterday showed resolution of his gastric pneumatosis.  Surgery has cleared him for enteral feeding.  Per discussion with surgery, they would not be able to place a J-tube until next week at the earliest.  Will continue TPN for now and try to see if he can tolerate some p.o. intake.  Previously he was not able to take in enough  p.o. in order to maintain his nutrition and there was also concern for issues with nausea/vomiting and ab pain related to p.o. intake.  Will again need to get him back on p.o. pain medications.  Once patient is ready for discharge, we will plan for outpatient GI follow-up with a CT scan 1 week prior to his scheduled ERCP to evaluate for pancreatic duct disruption.  Spoke with Dr. Wilhelmenia and he recommended keeping ERCP for next month scheduled as is.  Estefana Kidney, MD

## 2024-01-31 NOTE — TOC Progression Note (Signed)
 Transition of Care [facility]) - Progression Note    Patient Details  Name: [name] [name] MRN: [id number] Date of Birth: 07-03-1990  [facility] CM/SW Contact  [name] JONELLE Joe, RN Phone Number: 01/31/2024, 11:42 AM  Clinical Narrative:    CM spoke with Dr. Maree and patient will be seen by dietary today in hopes that he can advance his diet.  Patient may need TPN for home.  No discharge date has been set at this time.  I called and updated Holley Herring, RNCM with [employer] so she would be aware that patient may need TPN for home.  PICC line is in place.  Patient is not medically stable and may be Sunday/Monday before patient can go home.                     Expected Discharge Plan and Services                                               Social Drivers of Health (SDOH) Interventions SDOH Screenings   Food Insecurity: No Food Insecurity (01/11/2024)  Housing: Low Risk  (01/11/2024)  Transportation Needs: No Transportation Needs (01/11/2024)  Utilities: Not At Risk (01/11/2024)  Depression (PHQ2-9): Medium Risk (09/09/2023)  Financial Resource Strain: Low Risk  (04/25/2023)  Physical Activity: Sufficiently Active (04/25/2023)  Social Connections: Unknown (04/25/2023)  Stress: No Stress Concern Present (04/25/2023)  Tobacco Use: Low Risk  (01/20/2024)    Readmission Risk Interventions    01/29/2024   12:53 PM 01/15/2024   11:03 AM  Readmission Risk Prevention Plan  Post Dischage Appt  Complete  Medication Screening  Complete  Transportation Screening Complete Complete  PCP or Specialist Appt within 5-7 Days Complete   Home Care Screening Complete   Medication Review (RN CM) Complete

## 2024-01-31 NOTE — Progress Notes (Signed)
 PHARMACY - ANTICOAGULATION CONSULT NOTE  Pharmacy Consult for Warfarin Indication: On-X mechanical AVR  Allergies  Allergen Reactions   Amoxicillin Hives    Patient Measurements: Height: 5' 11 (180.3 cm) Weight: 66.5 kg (146 lb 9.7 oz) IBW/kg (Calculated) : 75.3 HEPARIN  DW (KG): 71  Vital Signs: Temp: 98.5 F (36.9 C) (08/22 1246) Temp Source: Oral (08/22 1246) BP: 123/87 (08/22 1246) Pulse Rate: 73 (08/22 1246)  Labs: Recent Labs    01/29/24 0209 01/29/24 2112 01/30/24 0500 01/31/24 0422  HGB  --   --  9.9* 10.2*  HCT  --   --  29.6* 31.4*  PLT  --   --  252 272  LABPROT 18.0*  --  19.3* 15.7*  INR 1.4*  --  1.5* 1.2  HEPARINUNFRC  --  0.14* 0.23*  --   CREATININE 0.78  --  0.69 0.60*    Estimated Creatinine Clearance: 123.5 mL/min (A) (by C-G formula based on SCr of 0.6 mg/dL (L)).  Assessment: 33 yo M presenting with pancreatitis. Warfarin PTA regimen is 6 mg Monday and Thursday and 4.5 mg all other days (total weekly dose: 34.5 mg).  It has been difficult to maintain INR within the narrow 1.5-2 goal range, especially given that changes in oral / tube feed intake.  Curently, INR subtherapeutic at 1.2.  Goal of Therapy:  INR 1.5-2.0 Monitor platelets by anticoagulation protocol: Yes   Plan:  Warfarin 5 mg PO x1  Daily INR,CBC   Consider restarting Enoxaparin  if INR remains subtherapeutic.  [employer], Pharm.D., BCPS Clinical Pharmacist Clinical phone for 01/31/2024 from 7:30-3:00 is [phone].  **Pharmacist phone directory can be found on [email or web address] listed under [facility].  01/31/2024 1:05 PM

## 2024-01-31 NOTE — Plan of Care (Signed)
  Problem: Education: Goal: Knowledge of General Education information will improve Description: Including pain rating scale, medication(s)/side effects and non-pharmacologic comfort measures 01/31/2024 0120 by Evern Monica HERO, RN Outcome: Progressing 01/31/2024 0120 by Evern Monica HERO, RN Outcome: Progressing   Problem: Health Behavior/Discharge Planning: Goal: Ability to manage health-related needs will improve 01/31/2024 0120 by Evern Monica HERO, RN Outcome: Progressing 01/31/2024 0120 by Evern Monica HERO, RN Outcome: Progressing   Problem: Clinical Measurements: Goal: Ability to maintain clinical measurements within normal limits will improve 01/31/2024 0120 by Evern Monica HERO, RN Outcome: Progressing 01/31/2024 0120 by Evern Monica HERO, RN Outcome: Progressing Goal: Will remain free from infection 01/31/2024 0120 by Evern Monica HERO, RN Outcome: Progressing 01/31/2024 0120 by Evern Monica HERO, RN Outcome: Progressing Goal: Diagnostic test results will improve 01/31/2024 0120 by Evern Monica HERO, RN Outcome: Progressing 01/31/2024 0120 by Evern Monica HERO, RN Outcome: Progressing Goal: Respiratory complications will improve 01/31/2024 0120 by Evern Monica HERO, RN Outcome: Progressing 01/31/2024 0120 by Evern Monica HERO, RN Outcome: Progressing Goal: Cardiovascular complication will be avoided 01/31/2024 0120 by Evern Monica HERO, RN Outcome: Progressing 01/31/2024 0120 by Evern Monica HERO, RN Outcome: Progressing   Problem: Activity: Goal: Risk for activity intolerance will decrease 01/31/2024 0120 by Evern Monica HERO, RN Outcome: Progressing 01/31/2024 0120 by Evern Monica HERO, RN Outcome: Progressing   Problem: Nutrition: Goal: Adequate nutrition will be maintained 01/31/2024 0120 by Evern Monica HERO, RN Outcome: Progressing 01/31/2024 0120 by Evern Monica HERO, RN Outcome: Progressing   Problem: Coping: Goal: Level of anxiety will decrease 01/31/2024  0120 by Evern Monica HERO, RN Outcome: Progressing 01/31/2024 0120 by Evern Monica HERO, RN Outcome: Progressing   Problem: Elimination: Goal: Will not experience complications related to bowel motility 01/31/2024 0120 by Evern Monica HERO, RN Outcome: Progressing 01/31/2024 0120 by Evern Monica HERO, RN Outcome: Progressing Goal: Will not experience complications related to urinary retention 01/31/2024 0120 by Evern Monica HERO, RN Outcome: Progressing 01/31/2024 0120 by Evern Monica HERO, RN Outcome: Progressing   Problem: Pain Managment: Goal: General experience of comfort will improve and/or be controlled 01/31/2024 0120 by Evern Monica HERO, RN Outcome: Progressing 01/31/2024 0120 by Evern Monica HERO, RN Outcome: Progressing   Problem: Safety: Goal: Ability to remain free from injury will improve 01/31/2024 0120 by Evern Monica HERO, RN Outcome: Progressing 01/31/2024 0120 by Evern Monica HERO, RN Outcome: Progressing   Problem: Skin Integrity: Goal: Risk for impaired skin integrity will decrease 01/31/2024 0120 by Evern Monica HERO, RN Outcome: Progressing 01/31/2024 0120 by Evern Monica HERO, RN Outcome: Progressing

## 2024-01-31 NOTE — Progress Notes (Signed)
 PROGRESS NOTE    [name] [name]  FMW:[id number] DOB: 1991/05/19 DOA: 01/11/2024 PCP: Lucius Krabbe, NP   Brief Narrative:    33 year old male with medical history significant for hypertension, s/p mechanical aortic valve replacement 09/27/21 on chronic warfarin anticoagulation, recurrent pancreatitis with multiple hospitalizations, asthma, GERD, prior history of alcohol use disorder (abstinent for 10 months PTA), presented to the hospital on 01/11/2024 with complaints of abdominal pain and admitted for AKI, pancreatitis with pseudocyst.  GI was consulted, patient was treated supportively with n.p.o., core track with postpyloric feeding.  Core track was dislodged on 8/18, briefly treated with liquid diet.  CT A/P on 8/18 showed gastric pneumatosis, general surgery was consulted, octreotide  stopped, patient unable to tolerate NG tube placement, made n.p.o. again, PICC line placed and starting TPN on 8/20.  CT abdomen pelvis on 8/21 shows no further pneumatosis and per general surgery he is okay for dietary advancement.  He continues to remain on TPN and likely will still need this on discharge given uncertain oral intake.  TOC states that this can be arranged by Tuesday of next week.  Currently monitoring with gradual advancement in dietary intake.  General surgery and GI following.  Assessment & Plan:   Principal Problem:   Acute on chronic pancreatitis [facility]) Active Problems:   Pseudocyst of pancreas   S/P AVR (aortic valve replacement) and aortoplasty   Essential hypertension   On pre-exposure prophylaxis for HIV   History of asthma   History of alcohol abuse   Protein-calorie malnutrition, severe   Intraabdominal fluid collection  Assessment and Plan:   Acute on chronic pancreatitis, recurrent pancreatitis Acute fluid collection/pseudocyst Gastric pneumatosis As per chart review, EUS by Dr. Wilhelmenia in 07/2023 that showed cystic lesion in the head of the pancreas  consistent with a pseudocyst and a 5 mm stone within the pancreatic duct at the junction of the head/neck.  Cytology of the cyst was benign.  He was scheduled for an ERCP with attempted stone extraction in September with Dr. Wilhelmenia but now admitted with abdominal pain. [place] [facility] was consulted, patient was treated supportively with n.p.o., IVF, core track with postpyloric feeding (initially started on 8/6).  Core track was dislodged on 8/18, briefly treated with liquid diet and core track was replaced.   CT A/P on 8/18 showed gastric pneumatosis, general surgery was consulted, octreotide  stopped (to avoid dysmotility), patient unable to tolerate NG tube placement, made n.p.o. again/tube feeds held, gentle IVF continuing, PICC line placed and starting TPN on 8/20. As per [facility] follow-up, okay for dietary advancement given CT abdomen and pelvis without further pneumatosis on 8/21.  Advancing to full liquid diet today.  Continue TPN and will need home arrangements on discharge, hopefully early next week.  Will need to discontinue cortrack prior to discharge.   S/p mechanical aortic valve replacement and aortoplasty INR is 1.2, subtherapeutic (goal INR 1.5-2) Continue warfarin per pharmacy and will initiate IV heparin  bridge until INR is therapeutic Continue aspirin  81 mg daily. Follows with Dr. Wendel, Cardiology and the Coumadin  Clinic   Essential hypertension Controlled on metoprolol  IV 2.5 mg every 6 hours.   Alcohol use disorder, in remission Stable   Asthma Stable   Anemia of Chronic Disease Stable.   Small intestinal bacterial overgrowth (SIBO) Completed 14 days of rifaximin .  Resolved.   Nutrition Status: Nutrition Problem: Severe Malnutrition Etiology: vomiting, chronic illness, altered GI function Signs/Symptoms: moderate fat depletion, severe muscle depletion Interventions: Refer to RD note for recommendations  Agree with registered dietitian evaluation and management  recommendations.   Body mass index is 21.15 kg/m.    DVT prophylaxis: Warfarin Code Status: Full Family Communication: None at bedside Disposition Plan:  Status is: Inpatient Remains inpatient appropriate because: Need for IV medications.   Consultants:  GI General surgery  Procedures:  PICC line placement 8/20  Antimicrobials:  Anti-infectives (From admission, onward)    Start     Dose/Rate Route Frequency Ordered Stop   01/18/24 1230  rifaximin  (XIFAXAN ) tablet 550 mg  Status:  Discontinued        550 mg Oral 3 times daily 01/18/24 1132 01/26/24 1323       Subjective: Patient seen and evaluated today with no new acute complaints or concerns. No acute concerns or events noted overnight.  He appears to be tolerating full liquid diet without any abdominal pain nausea or vomiting.  Objective: Vitals:   01/31/24 0628 01/31/24 0728 01/31/24 1119 01/31/24 1246  BP: 126/87 110/73 123/84 123/87  Pulse: 75 72 79 73  Resp:   18 18  Temp: (!) 97.2 F (36.2 C) 98.2 F (36.8 C) (!) 97.5 F (36.4 C) 98.5 F (36.9 C)  TempSrc:    Oral  SpO2: 98% 98% 100% 100%  Weight:      Height:        Intake/Output Summary (Last 24 hours) at 01/31/2024 1306 Last data filed at 01/31/2024 0202 Gross per 24 hour  Intake 786.17 ml  Output 0 ml  Net 786.17 ml   Filed Weights   01/23/24 0500 01/27/24 0500 01/31/24 0500  Weight: 66 kg 68.8 kg 66.5 kg    Examination:  General exam: Appears calm and comfortable  Respiratory system: Clear to auscultation. Respiratory effort normal. Cardiovascular system: S1 & S2 heard, RRR.  Gastrointestinal system: Abdomen is soft Central nervous system: Alert and awake Extremities: No edema Skin: No significant lesions noted, PICC line present to right upper extremity. Psychiatry: Flat affect.    Data Reviewed: I have personally reviewed following labs and imaging studies  CBC: Recent Labs  Lab 01/25/24 0418 01/27/24 0229 01/30/24 0500  01/31/24 0422  WBC 6.2 7.6 5.5 5.0  NEUTROABS 3.3  --   --   --   HGB 10.5* 10.6* 9.9* 10.2*  HCT 31.3* 31.7* 29.6* 31.4*  MCV 86.5 87.3 87.1 86.3  PLT 264 354 252 272   Basic Metabolic Panel: Recent Labs  Lab 01/25/24 0418 01/27/24 0229 01/29/24 0209 01/30/24 0500 01/31/24 0422  NA 138 138 137 140 141  K 3.9 4.0 3.7 3.7 3.8  CL 99 99 103 102 103  CO2 29 30 27 25 26   GLUCOSE 104* 104* 99 123* 139*  BUN 11 10 7 10 9   CREATININE 0.70 0.86 0.78 0.69 0.60*  CALCIUM  8.8* 9.3 8.6* 8.6* 8.8*  MG  --   --  2.0 1.9 2.0  PHOS  --   --  3.6 4.0 4.0   GFR: Estimated Creatinine Clearance: 123.5 mL/min (A) (by C-G formula based on SCr of 0.6 mg/dL (L)). Liver Function Tests: Recent Labs  Lab 01/25/24 0418 01/27/24 0229 01/29/24 0209 01/30/24 0500  AST 21 17 14* 15  ALT 26 22 16 15   ALKPHOS 68 72 83 85  BILITOT 0.4 0.5 0.5 0.6  PROT 6.7 6.6 6.4* 6.3*  ALBUMIN  3.4* 3.3* 3.2* 3.2*   Recent Labs  Lab 01/25/24 0418 01/27/24 0229  LIPASE 142* 344*   No results for input(s): AMMONIA in the last 168  hours. Coagulation Profile: Recent Labs  Lab 01/27/24 0229 01/28/24 0413 01/29/24 0209 01/30/24 0500 01/31/24 0422  INR 2.4* 2.1* 1.4* 1.5* 1.2   Cardiac Enzymes: No results for input(s): CKTOTAL, CKMB, CKMBINDEX, TROPONINI in the last 168 hours. BNP (last 3 results) No results for input(s): PROBNP in the last 8760 hours. HbA1C: No results for input(s): HGBA1C in the last 72 hours. CBG: Recent Labs  Lab 01/30/24 2007 01/30/24 2352 01/31/24 0622 01/31/24 0800 01/31/24 1247  GLUCAP 144* 135* 136* 126* 137*   Lipid Profile: Recent Labs    01/29/24 0930  TRIG 96   Thyroid Function Tests: No results for input(s): TSH, T4TOTAL, FREET4, T3FREE, THYROIDAB in the last 72 hours. Anemia Panel: No results for input(s): VITAMINB12, FOLATE, FERRITIN, TIBC, IRON, RETICCTPCT in the last 72 hours. Sepsis Labs: No results for input(s):  PROCALCITON, LATICACIDVEN in the last 168 hours.  No results found for this or any previous visit (from the past 240 hours).       Radiology Studies: CT ABDOMEN PELVIS W CONTRAST Result Date: 01/30/2024 EXAM: CT ABDOMEN AND PELVIS WITH CONTRAST 01/30/2024 01:52:00 PM TECHNIQUE: CT of the abdomen and pelvis was performed with the administration of intravenous contrast. Multiplanar reformatted images are provided for review. Automated exposure control, iterative reconstruction, and/or weight-based adjustment of the mA/kV was utilized to reduce the radiation dose to as low as reasonably achievable. COMPARISON: 01/27/2024 CLINICAL HISTORY: Gastric pneumatosis. FINDINGS: LOWER CHEST: Minimal dependent atelectasis at the left lung base. LIVER: Normal size and contour. GALLBLADDER AND BILE DUCTS: No wall thickening. No cholelithiasis. No biliary ductal dilatation. SPLEEN: Normal size. No focal lesion. PANCREAS: Scattered coarse calcifications in the pancreatic head and body as before. Dilated pancreatic duct. Multilocular cystic process anterior to the pancreatic head, abutting the gastric antrum, measured approximately 4.2 cm maximum in transverse diameter, relatively stable from prior study, consistent with pseudocysts. ADRENAL GLANDS: Normal appearance. No mass. KIDNEYS, URETERS AND BLADDER: No stones in the kidneys or ureters. No hydronephrosis. No perinephric or periureteral stranding. Urinary bladder is unremarkable. GI AND BOWEL: Stomach is decompressed. No pneumatosis. Normal appendix. PERITONEUM AND RETROPERITONEUM: No ascites. No free air. VASCULATURE: Tapered narrowing of the splenic vein in the midline. Moderate narrowing of the superior mesenteric vein near the portal - splenic confluence without definite venous collateral filling. LYMPH NODES: No lymphadenopathy. REPRODUCTIVE ORGANS: No significant abnormality. BONES AND SOFT TISSUES: Sternotomy wires. Feeding tube to the proximal jejunum. No  acute osseous abnormality. No focal soft tissue abnormality. IMPRESSION: 1. No evidence of gastric pneumatosis. 2. Stable multilocular cystic process anterior to the pancreatic head, consistent with pseudocysts. 3. Tapered narrowing of the splenic vein in the midline and moderate narrowing of the superior mesenteric vein near the portal-splenic confluence without definite venous collaterals. Electronically signed by: Dayne Hassell MD 01/30/2024 04:40 PM EDT RP Workstation: [id number]        Scheduled Meds:  aspirin  EC  81 mg Oral Daily   Chlorhexidine  Gluconate Cloth  6 each Topical Daily   insulin  aspart  0-9 Units Subcutaneous Q6H   lipase/protease/amylase  72,000 Units Oral TID AC   metoprolol  tartrate  2.5 mg Intravenous Q6H   pantoprazole  (PROTONIX ) IV  40 mg Intravenous Q12H   sodium chloride  flush  3 mL Intravenous Q12H   Warfarin - Pharmacist Dosing Inpatient   Does not apply q1600   Continuous Infusions:  potassium chloride  10 mEq (01/31/24 1227)   TPN ADULT (ION) 90 mL/hr at 01/31/24 0202   TPN ADULT (ION)  LOS: 20 days    Time spent: 55 minutes    Guillermo Difrancesco JONETTA Fairly, DO [facility]  If 7PM-7AM, please contact night-coverage [email or web address] 01/31/2024, 1:06 PM

## 2024-01-31 NOTE — Plan of Care (Signed)
  Problem: Education: Goal: Knowledge of General Education information will improve Description: Including pain rating scale, medication(s)/side effects and non-pharmacologic comfort measures Outcome: Progressing   Problem: Health Behavior/Discharge Planning: Goal: Ability to manage health-related needs will improve Outcome: Progressing   Problem: Clinical Measurements: Goal: Respiratory complications will improve Outcome: Progressing   Problem: Clinical Measurements: Goal: Cardiovascular complication will be avoided Outcome: Progressing   Problem: Activity: Goal: Risk for activity intolerance will decrease Outcome: Progressing   Problem: Nutrition: Goal: Adequate nutrition will be maintained Outcome: Progressing   Problem: Coping: Goal: Level of anxiety will decrease Outcome: Progressing   Problem: Elimination: Goal: Will not experience complications related to bowel motility Outcome: Progressing   Problem: Pain Managment: Goal: General experience of comfort will improve and/or be controlled Outcome: Progressing   Problem: Safety: Goal: Ability to remain free from injury will improve Outcome: Progressing   Problem: Skin Integrity: Goal: Risk for impaired skin integrity will decrease Outcome: Progressing

## 2024-01-31 NOTE — Progress Notes (Signed)
 PHARMACY - TOTAL PARENTERAL NUTRITION CONSULT NOTE   Indication: severe pancreatitis and anticipated prolong NPO status  Patient Measurements: Height: 5' 11 (180.3 cm) Weight: 66.5 kg (146 lb 9.7 oz) IBW/kg (Calculated) : 75.3 TPN AdjBW (KG): 71 Body mass index is 20.45 kg/m. Usual Weight: 66 kg  Assessment:  33 y.o. year old male with a past medical history significant for chronic pancreatitis, history of aortic valve replacement requiring chronic anticoagulation, SIBO s/p course of rifaximin . Surgery has recommended postpyloric feedings be held due to the presence of pneumatosis. Mansouraty who recommends against any endoscopic intervention at this time due to gastric pneumatosis and immature wall of the pancreas fluid collection. PEJ to be considered when enteral feeding can be safely resumed. Pharmacy consulted to initiate TPN in the interim.  Per patient, had mild flushing and headache overnight, resolved without needing interventions. No issues with nausea/vomiting. No BM since 8/15 per charting and confirmation by patient. Still good UOP per patient.   Glucose / Insulin : no hx DM, CBGs < 150 with no insulin  needed Electrolytes: Na 141, K 3.8, Ch 103, Ca 8.8 [CoCa 9.4], Phos 4.0, Mg 2.0 Renal: Scr < 1, BUN 9 Hepatic: AST/ALT wnl, alb 3.2, Tbili 0.6, TG 96 (recently elevated 08/2023) Intake / Output; MIVF: LBM 8/16, UOP not charted  GI Imaging: 8/02 CT: severe sequelae of advanced pancreatitis 8/14 MRCP: acute pancreatitis, severe pancreatic ductal dilatation, concerning for ulceration to the bowel lumen 8/18 CT: gas density between the intraluminal gastric fluid and the wall of the stomach in the posterior and lateral portions of the fundus, concerning for pneumatosis 8/21 CT abd shows no evidence of gastric pneumatosis, stable multilocular cystic process anterior to the pancreatic head  GI Surgeries / Procedures:  None since starting TPN  Central access: (01/29/24)  TPN  start date: (01/29/24)  Nutritional Goals: Goal TPN rate is 90 mL/hr (provides 108 g of protein and 2120 kcals per day)  RD Assessment: Estimated Needs Total Energy Estimated Needs: 2000-2350 kcal Total Protein Estimated Needs: 105-140 g Total Fluid Estimated Needs: >/=2L  Current Nutrition:  NPO  Plan:  Continue TPN to 90 mL/hr at 1800 to provide 100% nutritional needs Electrolytes in TPN: Na 42mEq/L, K 12mEq/L, Ca 66mEq/L, Mg 2mEq/L, and Phos 15mmol/L. Cl:Ac 1:1 Add standard MVI and trace elements to TPN Continue Sensitive q6h SSI at [facility] and adjust as needed  MIVF to 10 mL/hr at 1800  K 10 meq iv x2 Bmet in AM (8/23) Monitor TPN labs on Mon/Thurs, daily PRN  Thank you for allowing pharmacy to be a part of this patient's care.  Benedetta Heath BS, PharmD, BCPS Clinical Pharmacist 01/31/2024 8:46 AM  Contact: [phone] after 3 PM

## 2024-01-31 NOTE — Progress Notes (Signed)
 Progress Note     Subjective: Patient reports that his pain is similar of the epigastric region. No worsening of pain. Feels this is manageable. Denies nausea and vomiting. Has not had a bowel movement since approximately 8/15. Reports flatulence.   ROS  All negative with the exception of above.  Objective: Vital signs in last 24 hours: Temp:  [97.2 F (36.2 C)-99.9 F (37.7 C)] 97.2 F (36.2 C) (08/22 0628) Pulse Rate:  [74-83] 75 (08/22 0628) Resp:  [18] 18 (08/21 1627) BP: (109-126)/(71-87) 126/87 (08/22 0628) SpO2:  [96 %-98 %] 98 % (08/22 0628) Weight:  [66.5 kg] 66.5 kg (08/22 0500) Last BM Date : 01/25/24  Intake/Output from previous day: 08/21 0701 - 08/22 0700 In: 786.2 [I.V.:786.2] Out: 0  Intake/Output this shift: No intake/output data recorded.  PE: General: Pleasant male who is laying in bed in NAD. HEENT: Feeding tube in place and visible at nare. Lungs: Respiratory effort nonlabored Abd: Soft and ND. Mild tenderness to palpation of epigastric region. No rebound tenderness or guarding. Psych: A&Ox3 with an appropriate affect.    Lab Results:  Recent Labs    01/30/24 0500 01/31/24 0422  WBC 5.5 5.0  HGB 9.9* 10.2*  HCT 29.6* 31.4*  PLT 252 272   BMET Recent Labs    01/30/24 0500 01/31/24 0422  NA 140 141  K 3.7 3.8  CL 102 103  CO2 25 26  GLUCOSE 123* 139*  BUN 10 9  CREATININE 0.69 0.60*  CALCIUM  8.6* 8.8*   PT/INR Recent Labs    01/30/24 0500 01/31/24 0422  LABPROT 19.3* 15.7*  INR 1.5* 1.2   CMP     Component Value Date/Time   NA 141 01/31/2024 0422   NA 142 08/30/2021 1430   K 3.8 01/31/2024 0422   CL 103 01/31/2024 0422   CO2 26 01/31/2024 0422   GLUCOSE 139 (H) 01/31/2024 0422   BUN 9 01/31/2024 0422   BUN 13 08/30/2021 1430   CREATININE 0.60 (L) 01/31/2024 0422   CALCIUM  8.8 (L) 01/31/2024 0422   PROT 6.3 (L) 01/30/2024 0500   ALBUMIN  3.2 (L) 01/30/2024 0500   AST 15 01/30/2024 0500   ALT 15 01/30/2024 0500    ALKPHOS 85 01/30/2024 0500   BILITOT 0.6 01/30/2024 0500   GFRNONAA >60 01/31/2024 0422   Lipase     Component Value Date/Time   LIPASE 344 (H) 01/27/2024 0229       Studies/Results: CT ABDOMEN PELVIS W CONTRAST Result Date: 01/30/2024 EXAM: CT ABDOMEN AND PELVIS WITH CONTRAST 01/30/2024 01:52:00 PM TECHNIQUE: CT of the abdomen and pelvis was performed with the administration of intravenous contrast. Multiplanar reformatted images are provided for review. Automated exposure control, iterative reconstruction, and/or weight-based adjustment of the mA/kV was utilized to reduce the radiation dose to as low as reasonably achievable. COMPARISON: 01/27/2024 CLINICAL HISTORY: Gastric pneumatosis. FINDINGS: LOWER CHEST: Minimal dependent atelectasis at the left lung base. LIVER: Normal size and contour. GALLBLADDER AND BILE DUCTS: No wall thickening. No cholelithiasis. No biliary ductal dilatation. SPLEEN: Normal size. No focal lesion. PANCREAS: Scattered coarse calcifications in the pancreatic head and body as before. Dilated pancreatic duct. Multilocular cystic process anterior to the pancreatic head, abutting the gastric antrum, measured approximately 4.2 cm maximum in transverse diameter, relatively stable from prior study, consistent with pseudocysts. ADRENAL GLANDS: Normal appearance. No mass. KIDNEYS, URETERS AND BLADDER: No stones in the kidneys or ureters. No hydronephrosis. No perinephric or periureteral stranding. Urinary bladder is unremarkable. GI  AND BOWEL: Stomach is decompressed. No pneumatosis. Normal appendix. PERITONEUM AND RETROPERITONEUM: No ascites. No free air. VASCULATURE: Tapered narrowing of the splenic vein in the midline. Moderate narrowing of the superior mesenteric vein near the portal - splenic confluence without definite venous collateral filling. LYMPH NODES: No lymphadenopathy. REPRODUCTIVE ORGANS: No significant abnormality. BONES AND SOFT TISSUES: Sternotomy wires.  Feeding tube to the proximal jejunum. No acute osseous abnormality. No focal soft tissue abnormality. IMPRESSION: 1. No evidence of gastric pneumatosis. 2. Stable multilocular cystic process anterior to the pancreatic head, consistent with pseudocysts. 3. Tapered narrowing of the splenic vein in the midline and moderate narrowing of the superior mesenteric vein near the portal-splenic confluence without definite venous collaterals. Electronically signed by: Dayne Hassell MD 01/30/2024 04:40 PM EDT RP Workstation: [id number]    Anti-infectives: Anti-infectives (From admission, onward)    Start     Dose/Rate Route Frequency Ordered Stop   01/18/24 1230  rifaximin  (XIFAXAN ) tablet 550 mg  Status:  Discontinued        550 mg Oral 3 times daily 01/18/24 1132 01/26/24 1323        Assessment/Plan Acute on chronic pancreatitis Calcifications of head of the pancreas Pancreatic pseudocyst Gastric pneumatosis -MRCP completed 8/14 -Octreotide  was discontinued 8/18. -Most recent imaging includes: CT abd/pel w contrast 8/21 showing no evidence of gastric pneumatosis. Stable multilocular cystic process anterior to the pancreatic head, consistent with pseudocysts.  -WBC 5.0. HGB 10.2 from 9.9 -Afebrile. Vitals stable. -Gastric pneumatosis has resolved. Okay to resume tube feeds from our standpoint. No acute surgical intervention recommended at this time. -If patient tolerating tube feeds/CLD, patient okay for discharge from surgical standpoint as patient is eager to go home. Will arrange outpatient follow up for further consideration of J-tube.   FEN: Okay to resume tube feeds from surgical standpoint; CLD; TPN; IVF per primary team VTE: Warfarin ID: None currently    LOS: 20 days   I reviewed specialist notes, hospitalist notes, last 24 h vitals and pain scores, last 48 h intake and output, last 24 h labs and trends, and last 24 h imaging results.  This care required moderate level of medical  decision making.    Marjorie Carlyon Favre, [facility] Surgery 01/31/2024, 7:22 AM Please see Amion for pager number during day hours 7:00am-4:30pm

## 2024-01-31 NOTE — Progress Notes (Signed)
 Chaplain responded to spiritual consult request to complete AD/HCPOA documents. Pt [name] [name] was not aware of the process or what's included in those documents, and stated his mother had been working on them. He shared that she would be present later today, but was not there at the time.   Please page chaplains when mother arrives and [name] [name] is ready to proceed.   [phone]

## 2024-02-01 DIAGNOSIS — R188 Other ascites: Secondary | ICD-10-CM | POA: Diagnosis not present

## 2024-02-01 DIAGNOSIS — E43 Unspecified severe protein-calorie malnutrition: Secondary | ICD-10-CM | POA: Diagnosis not present

## 2024-02-01 DIAGNOSIS — K863 Pseudocyst of pancreas: Secondary | ICD-10-CM | POA: Diagnosis not present

## 2024-02-01 DIAGNOSIS — K861 Other chronic pancreatitis: Secondary | ICD-10-CM | POA: Diagnosis not present

## 2024-02-01 DIAGNOSIS — K859 Acute pancreatitis without necrosis or infection, unspecified: Secondary | ICD-10-CM | POA: Diagnosis not present

## 2024-02-01 LAB — BASIC METABOLIC PANEL WITH GFR
Anion gap: 8 (ref 5–15)
BUN: 10 mg/dL (ref 6–20)
CO2: 25 mmol/L (ref 22–32)
Calcium: 8.9 mg/dL (ref 8.9–10.3)
Chloride: 104 mmol/L (ref 98–111)
Creatinine, Ser: 0.55 mg/dL — ABNORMAL LOW (ref 0.61–1.24)
GFR, Estimated: >60 mL/min (ref >60)
Glucose, Bld: 128 mg/dL — ABNORMAL HIGH (ref 70–99)
Potassium: 4.3 mmol/L (ref 3.5–5.1)
Sodium: 137 mmol/L (ref 135–145)

## 2024-02-01 LAB — PHOSPHORUS: Phosphorus: 4.2 mg/dL (ref 2.5–4.6)

## 2024-02-01 LAB — GLUCOSE, CAPILLARY
Glucose-Capillary: 111 mg/dL — ABNORMAL HIGH (ref 70–99)
Glucose-Capillary: 115 mg/dL — ABNORMAL HIGH (ref 70–99)
Glucose-Capillary: 125 mg/dL — ABNORMAL HIGH (ref 70–99)
Glucose-Capillary: 129 mg/dL — ABNORMAL HIGH (ref 70–99)
Glucose-Capillary: 139 mg/dL — ABNORMAL HIGH (ref 70–99)
Glucose-Capillary: 155 mg/dL — ABNORMAL HIGH (ref 70–99)

## 2024-02-01 LAB — CBC
HCT: 32.8 % — ABNORMAL LOW (ref 39.0–52.0)
Hemoglobin: 10.6 g/dL — ABNORMAL LOW (ref 13.0–17.0)
MCH: 28.5 pg (ref 26.0–34.0)
MCHC: 32.3 g/dL (ref 30.0–36.0)
MCV: 88.2 fL (ref 80.0–100.0)
Platelets: 284 K/uL (ref 150–400)
RBC: 3.72 MIL/uL — ABNORMAL LOW (ref 4.22–5.81)
RDW: 13.6 % (ref 11.5–15.5)
WBC: 4.3 K/uL (ref 4.0–10.5)
nRBC: 0 % (ref 0.0–0.2)

## 2024-02-01 LAB — PROTIME-INR
INR: 1.2 (ref 0.8–1.2)
Prothrombin Time: 15.4 s — ABNORMAL HIGH (ref 11.4–15.2)

## 2024-02-01 LAB — MAGNESIUM: Magnesium: 2 mg/dL (ref 1.7–2.4)

## 2024-02-01 MED ORDER — WARFARIN SODIUM 4 MG PO TABS
4.5000 mg | ORAL_TABLET | Freq: Once | ORAL | Status: AC
  Administered 2024-02-01: 4.5 mg via ORAL
  Filled 2024-02-01: qty 1

## 2024-02-01 MED ORDER — BOOST / RESOURCE BREEZE PO LIQD CUSTOM
1.0000 | Freq: Three times a day (TID) | ORAL | Status: DC
  Administered 2024-02-03 – 2024-02-05 (×6): 1 via ORAL
  Administered 2024-02-05 (×2): 237 mL via ORAL
  Administered 2024-02-06: 1 via ORAL

## 2024-02-01 MED ORDER — TRAVASOL 10 % IV SOLN
INTRAVENOUS | Status: AC
  Filled 2024-02-01: qty 1080

## 2024-02-01 MED ORDER — POLYETHYLENE GLYCOL 3350 17 G PO PACK
17.0000 g | PACK | Freq: Every day | ORAL | Status: DC
  Administered 2024-02-01 – 2024-02-04 (×3): 17 g via ORAL
  Filled 2024-02-01 (×6): qty 1

## 2024-02-01 NOTE — Progress Notes (Signed)
 PHARMACY - ANTICOAGULATION CONSULT NOTE  Pharmacy Consult for Warfarin Indication: On-X mechanical AVR  Allergies  Allergen Reactions   Amoxicillin Hives    Patient Measurements: Height: 5' 11 (180.3 cm) Weight: 66.7 kg (147 lb 0.8 oz) IBW/kg (Calculated) : 75.3 HEPARIN  DW (KG): 71  Vital Signs: Temp: 98 F (36.7 C) (08/23 0831) BP: 111/76 (08/23 0831) Pulse Rate: 78 (08/23 0831)  Labs: Recent Labs    01/29/24 2112 01/30/24 0500 01/30/24 0500 01/31/24 0422 02/01/24 0244  HGB  --  9.9*   < > 10.2* 10.6*  HCT  --  29.6*  --  31.4* 32.8*  PLT  --  252  --  272 284  LABPROT  --  19.3*  --  15.7* 15.4*  INR  --  1.5*  --  1.2 1.2  HEPARINUNFRC 0.14* 0.23*  --   --   --   CREATININE  --  0.69  --  0.60* 0.55*   < > = values in this interval not displayed.    Estimated Creatinine Clearance: 123.9 mL/min (A) (by C-G formula based on SCr of 0.55 mg/dL (L)).  Assessment: 33 yo M presenting with pancreatitis. Warfarin PTA regimen is 6 mg Monday and Thursday and 4.5 mg all other days (total weekly dose: 34.5 mg).  It has been difficult to maintain INR within the narrow 1.5-2 goal range, especially given previous issues with enteral intake.   8/23 AM Update: INR remains subtherapeutic at 1.2 on variable warfarin dosing. Hgb stable and plt WNL. No issues with bleeding documented. Receiving enoxaparin  bridge for subtherapeutic INR. Previously supratherapeutic with less than home dose of warfarin, although was having issues with nutrition intake and patient is now receiving TPN to meet nutritional needs.   Goal of Therapy:  INR 1.5-2.0 Monitor platelets by anticoagulation protocol: Yes   Plan:  Give warfarin 4.5 mg x 1 today.  Continue enoxaparin  bridge for subtherapeutic INR.  Continue to monitor PT/INR, CBC, and s/sx of bleeding daily.   Maurilio Patten, PharmD PGY1 Pharmacy Resident [facility] 02/01/2024 10:39 AM

## 2024-02-01 NOTE — Progress Notes (Signed)
 Progress Note     Subjective: Denies any significant abdominal pain. Denies nausea and vomiting. +Flatus and BM. Tolerating liquids without difficulty.  ROS  All negative with the exception of above.  Objective: Vital signs in last 24 hours: Temp:  [97.5 F (36.4 C)-98.8 F (37.1 C)] 98 F (36.7 C) (08/23 0831) Pulse Rate:  [65-81] 78 (08/23 0831) Resp:  [17-18] 17 (08/23 0831) BP: (108-128)/(70-87) 111/76 (08/23 0831) SpO2:  [98 %-100 %] 100 % (08/23 0831) Weight:  [66.7 kg] 66.7 kg (08/23 0500) Last BM Date : 01/31/24  Intake/Output from previous day: 08/22 0701 - 08/23 0700 In: 23.5 [I.V.:23.5] Out: 0  Intake/Output this shift: No intake/output data recorded.  PE: General: Pleasant male who is laying in bed in NAD. HEENT: Feeding tube in place and visible at nare. Lungs: Respiratory effort nonlabored Abd: Soft and ND. Mild tenderness to palpation of epigastric region. No rebound tenderness or guarding. Psych: A&Ox3 with an appropriate affect.    Lab Results:  Recent Labs    01/31/24 0422 02/01/24 0244  WBC 5.0 4.3  HGB 10.2* 10.6*  HCT 31.4* 32.8*  PLT 272 284   BMET Recent Labs    01/31/24 0422 02/01/24 0244  NA 141 137  K 3.8 4.3  CL 103 104  CO2 26 25  GLUCOSE 139* 128*  BUN 9 10  CREATININE 0.60* 0.55*  CALCIUM  8.8* 8.9   PT/INR Recent Labs    01/31/24 0422 02/01/24 0244  LABPROT 15.7* 15.4*  INR 1.2 1.2   CMP     Component Value Date/Time   NA 137 02/01/2024 0244   NA 142 08/30/2021 1430   K 4.3 02/01/2024 0244   CL 104 02/01/2024 0244   CO2 25 02/01/2024 0244   GLUCOSE 128 (H) 02/01/2024 0244   BUN 10 02/01/2024 0244   BUN 13 08/30/2021 1430   CREATININE 0.55 (L) 02/01/2024 0244   CALCIUM  8.9 02/01/2024 0244   PROT 6.3 (L) 01/30/2024 0500   ALBUMIN  3.2 (L) 01/30/2024 0500   AST 15 01/30/2024 0500   ALT 15 01/30/2024 0500   ALKPHOS 85 01/30/2024 0500   BILITOT 0.6 01/30/2024 0500   GFRNONAA >60 02/01/2024 0244    Lipase     Component Value Date/Time   LIPASE 344 (H) 01/27/2024 0229       Studies/Results: CT ABDOMEN PELVIS W CONTRAST Result Date: 01/30/2024 EXAM: CT ABDOMEN AND PELVIS WITH CONTRAST 01/30/2024 01:52:00 PM TECHNIQUE: CT of the abdomen and pelvis was performed with the administration of intravenous contrast. Multiplanar reformatted images are provided for review. Automated exposure control, iterative reconstruction, and/or weight-based adjustment of the mA/kV was utilized to reduce the radiation dose to as low as reasonably achievable. COMPARISON: 01/27/2024 CLINICAL HISTORY: Gastric pneumatosis. FINDINGS: LOWER CHEST: Minimal dependent atelectasis at the left lung base. LIVER: Normal size and contour. GALLBLADDER AND BILE DUCTS: No wall thickening. No cholelithiasis. No biliary ductal dilatation. SPLEEN: Normal size. No focal lesion. PANCREAS: Scattered coarse calcifications in the pancreatic head and body as before. Dilated pancreatic duct. Multilocular cystic process anterior to the pancreatic head, abutting the gastric antrum, measured approximately 4.2 cm maximum in transverse diameter, relatively stable from prior study, consistent with pseudocysts. ADRENAL GLANDS: Normal appearance. No mass. KIDNEYS, URETERS AND BLADDER: No stones in the kidneys or ureters. No hydronephrosis. No perinephric or periureteral stranding. Urinary bladder is unremarkable. GI AND BOWEL: Stomach is decompressed. No pneumatosis. Normal appendix. PERITONEUM AND RETROPERITONEUM: No ascites. No free air. VASCULATURE: Tapered narrowing  of the splenic vein in the midline. Moderate narrowing of the superior mesenteric vein near the portal - splenic confluence without definite venous collateral filling. LYMPH NODES: No lymphadenopathy. REPRODUCTIVE ORGANS: No significant abnormality. BONES AND SOFT TISSUES: Sternotomy wires. Feeding tube to the proximal jejunum. No acute osseous abnormality. No focal soft tissue  abnormality. IMPRESSION: 1. No evidence of gastric pneumatosis. 2. Stable multilocular cystic process anterior to the pancreatic head, consistent with pseudocysts. 3. Tapered narrowing of the splenic vein in the midline and moderate narrowing of the superior mesenteric vein near the portal-splenic confluence without definite venous collaterals. Electronically signed by: Dayne Hassell MD 01/30/2024 04:40 PM EDT RP Workstation: [id number]    Anti-infectives: Anti-infectives (From admission, onward)    Start     Dose/Rate Route Frequency Ordered Stop   01/18/24 1230  rifaximin  (XIFAXAN ) tablet 550 mg  Status:  Discontinued        550 mg Oral 3 times daily 01/18/24 1132 01/26/24 1323        Assessment/Plan Acute on chronic pancreatitis Calcifications of head of the pancreas Pancreatic pseudocyst Gastric pneumatosis -MRCP completed 8/14 -Octreotide  was discontinued 8/18. -Most recent imaging includes: CT abd/pel w contrast 8/21 showing no evidence of gastric pneumatosis. Stable multilocular cystic process anterior to the pancreatic head, consistent with pseudocysts.  -WBC 5.0. HGB 10.2 from 9.9 -Afebrile. Vitals stable. -Gastric pneumatosis has resolved. Okay to resume tube feeds from our standpoint. No acute surgical intervention recommended at this time. -If patient tolerating tube feeds (and or protein shakes enterally), patient okay for discharge from surgical standpoint as patient is eager to go home. Will arrange outpatient follow up for further consideration of J-tube.   FEN: Okay to resume tube feeds from surgical standpoint; CLD; TPN; IVF per primary team VTE: Warfarin ID: None currently    LOS: 21 days   I reviewed specialist notes, hospitalist notes, last 24 h vitals and pain scores, last 48 h intake and output, last 24 h labs and trends, and last 24 h imaging results.  This care required moderate level of medical decision making.   I spent a total of 35 minutes today in  both face-to-face and non-face-to-face activities to perform the following: review records, take and update history, examine the patient, counsel the patient on the diagnosis, and document encounter, findings, and plan in the EHR  for this visit on the date of this encounter.  Lonni CHRISTELLA Pizza, MD  [facility] Surgery 02/01/2024, 8:42 AM Please see Amion for on call surgeon contact

## 2024-02-01 NOTE — Progress Notes (Addendum)
 Daily Progress Note  DOA: 01/11/2024 Hospital Day: 22  Cc: acute on chronic pancreatitis with new pseudocyst   ASSESSMENT    Patient Profile: 33 y.o. year old male with a past medical history including but not necessarily limited to chronic pancreatitis, history of aortic valve replacement requiring chronic anticoagulation, SIBO. He has had a prolonged admission for complicated acute on chronic pancreatitis.   Acute on chronic pancreatitis complicated by acute fluid collections / pseudocysts and more recent development of gastric pneumatosis.  Started on TPN 8/20.  Repeat CT AP 8/21 showed resolution of gastric pneumatosis, stable multilocular cystic process anterior to the pancreatic head consistent with pseudocysts. .  TODAY:  Has some mild increase in baseline abdominal pain with full liquids but manageable for the most part with with oral pain meds ( periodically has required a small dose of IV pain meds).      Weight loss and malnutrition secondary to above Zach repeatedly expresses concern that he will be unable to consume enough calories to sustain himself upon discharge. He is interested in a PEJ.    AVR, chronically anticoagulated with warfarin   SIBO Positive outpatient SIBO test, s/p course of rifaximin  here in the hospital   Principal Problem:   Acute on chronic pancreatitis (HCC) Active Problems:   S/P AVR (aortic valve replacement) and aortoplasty   Essential hypertension   Pseudocyst of pancreas   On pre-exposure prophylaxis for HIV   History of asthma   History of alcohol abuse   Protein-calorie malnutrition, severe   Intraabdominal fluid collection   PLAN   --Continue TPN --Continue full liquid diet.  --Continue boost twice daily --Continue Creon  --MiraLAX  17 g daily --Our office will arrange for outpatient follow up with GI. Additionally will need a CT scan 1 week prior to his scheduled ERCP with Dr. Wilhelmenia in September ( spoke with Dr.  Wilhelmenia and he recommended keeping ERCP as scheduled for next month).    Subjective   Has some mild increase in baseline abdominal pain with full liquids but manageable for the most part with with oral pain meds ( periodically has required a small dose of IV pain meds).    Had small BM yesterday.  Interested in getting MiraLAX   Objective     Recent Labs    01/30/24 0500 01/31/24 0422 02/01/24 0244  WBC 5.5 5.0 4.3  HGB 9.9* 10.2* 10.6*  HCT 29.6* 31.4* 32.8*  MCV 87.1 86.3 88.2  PLT 252 272 284   No results for input(s): FOLATE, VITAMINB12, FERRITIN, TIBC, IRONPCTSAT in the last 72 hours. Recent Labs    01/30/24 0500 01/31/24 0422 02/01/24 0244  NA 140 141 137  K 3.7 3.8 4.3  CL 102 103 104  CO2 25 26 25   GLUCOSE 123* 139* 128*  BUN 10 9 10   CREATININE 0.69 0.60* 0.55*  CALCIUM  8.6* 8.8* 8.9   Recent Labs    01/30/24 0500  PROT 6.3*  ALBUMIN  3.2*  AST 15  ALT 15  ALKPHOS 85  BILITOT 0.6     Imaging:  CT ABDOMEN PELVIS W CONTRAST EXAM: CT ABDOMEN AND PELVIS WITH CONTRAST 01/30/2024 01:52:00 PM  TECHNIQUE: CT of the abdomen and pelvis was performed with the administration of intravenous contrast. Multiplanar reformatted images are provided for review. Automated exposure control, iterative reconstruction, and/or weight-based adjustment of the mA/kV was utilized to reduce the radiation dose to as low as reasonably achievable.  COMPARISON: 01/27/2024  CLINICAL HISTORY: Gastric pneumatosis.  FINDINGS:  LOWER CHEST: Minimal dependent atelectasis at the left lung base.  LIVER: Normal size and contour.  GALLBLADDER AND BILE DUCTS: No wall thickening. No cholelithiasis. No biliary ductal dilatation.  SPLEEN: Normal size. No focal lesion.  PANCREAS: Scattered coarse calcifications in the pancreatic head and body as before. Dilated pancreatic duct. Multilocular cystic process anterior to the pancreatic head, abutting the gastric  antrum, measured approximately 4.2 cm maximum in transverse diameter, relatively stable from prior study, consistent with pseudocysts.  ADRENAL GLANDS: Normal appearance. No mass.  KIDNEYS, URETERS AND BLADDER: No stones in the kidneys or ureters. No hydronephrosis. No perinephric or periureteral stranding. Urinary bladder is unremarkable.  GI AND BOWEL: Stomach is decompressed. No pneumatosis. Normal appendix.  PERITONEUM AND RETROPERITONEUM: No ascites. No free air.  VASCULATURE: Tapered narrowing of the splenic vein in the midline. Moderate narrowing of the superior mesenteric vein near the portal - splenic confluence without definite venous collateral filling.  LYMPH NODES: No lymphadenopathy.  REPRODUCTIVE ORGANS: No significant abnormality.  BONES AND SOFT TISSUES: Sternotomy wires. Feeding tube to the proximal jejunum. No acute osseous abnormality. No focal soft tissue abnormality.  IMPRESSION: 1. No evidence of gastric pneumatosis. 2. Stable multilocular cystic process anterior to the pancreatic head, consistent with pseudocysts. 3. Tapered narrowing of the splenic vein in the midline and moderate narrowing of the superior mesenteric vein near the portal-splenic confluence without definite venous collaterals.  Electronically signed by: Dayne Hassell MD 01/30/2024 04:40 PM EDT RP Workstation: [id number]     Scheduled inpatient medications:   aspirin  EC  81 mg Oral Daily   Chlorhexidine  Gluconate Cloth  6 each Topical Daily   enoxaparin  (LOVENOX ) injection  60 mg Subcutaneous BID   feeding supplement  1 Container Oral BID BM   insulin  aspart  0-9 Units Subcutaneous Q6H   lipase/protease/amylase  72,000 Units Oral TID AC   metoprolol  succinate  12.5 mg Oral QHS   pantoprazole  (PROTONIX ) IV  40 mg Intravenous Q12H   sodium chloride  flush  3 mL Intravenous Q12H   warfarin  4.5 mg Oral ONCE-1600   Warfarin - Pharmacist Dosing Inpatient   Does not apply q1600    Continuous inpatient infusions:   TPN ADULT (ION) 90 mL/hr at 01/31/24 1728   TPN ADULT (ION)     PRN inpatient medications: albuterol , alum & mag hydroxide-simeth, HYDROmorphone  (DILAUDID ) injection, hydrOXYzine , morphine  injection, ondansetron  **OR** ondansetron  (ZOFRAN ) IV, oxyCODONE -acetaminophen , phenol, sodium chloride  flush  Vital signs in last 24 hours: Temp:  [97.5 F (36.4 C)-98.8 F (37.1 C)] 97.8 F (36.6 C) (08/23 1223) Pulse Rate:  [65-81] 81 (08/23 1223) Resp:  [17-18] 17 (08/23 1223) BP: (108-128)/(70-90) 128/90 (08/23 1223) SpO2:  [98 %-100 %] 100 % (08/23 1223) Weight:  [66.7 kg] 66.7 kg (08/23 0500) Last BM Date : 01/31/24  Intake/Output Summary (Last 24 hours) at 02/01/2024 1235 Last data filed at 02/01/2024 0300 Gross per 24 hour  Intake 23.5 ml  Output 0 ml  Net 23.5 ml    Intake/Output from previous day: 08/22 0701 - 08/23 0700 In: 23.5 [I.V.:23.5] Out: 0  Intake/Output this shift: No intake/output data recorded.   Physical Exam:  General: Alert male in NAD Heart:  Regular rate and rhythm.  Pulmonary: Normal respiratory effort Abdomen: Soft, nondistended, nontender. Normal bowel sounds. Extremities: No lower extremity edema  Neurologic: Alert and oriented Psych: Pleasant. Cooperative     LOS: 21 days   Vina Dasen ,NP 02/01/2024, 12:35 PM  I personally saw the patient  and performed a substantive portion of this encounter (>50% time spent), including a complete performance of at least one of the key components (MDM, Hx and/or Exam), in conjunction with the APP.  I agree with the APP's note, impression, and recommendations with additional input as follows.   Patient has been trying to consume PO foods, but has been having issues with abdominal pain that have limited his foods intake. He does not think that he will be able to maintain his nutritional status with PO intake alone. Patient is currently still on TPN therapy but would prefer to  get a J-tube placed for enteral tube feeds since this has better outcomes in the setting of pancreatitis. Surgery will be able to offer J-tube placement but patient prefers to know a definitive timeline before he discharges from the hospital. Thus I think the easiest way to guarantee a timely appt for J-tube placement would be for him to stay in the hospital at least until Monday so that we can sort this out. In the meantime will have him try nutritional supplements PO to see if he can tolerate this.  Estefana Kidney, MD

## 2024-02-01 NOTE — Plan of Care (Signed)

## 2024-02-01 NOTE — Progress Notes (Signed)
 PHARMACY - TOTAL PARENTERAL NUTRITION CONSULT NOTE   Indication: severe pancreatitis and anticipated prolong NPO status  Patient Measurements: Height: 5' 11 (180.3 cm) Weight: 66.7 kg (147 lb 0.8 oz) IBW/kg (Calculated) : 75.3 TPN AdjBW (KG): 71 Body mass index is 20.51 kg/m. Usual Weight: 66 kg  Assessment:  33 y.o. year old male with a past medical history significant for chronic pancreatitis, history of aortic valve replacement requiring chronic anticoagulation, SIBO s/p course of rifaximin . Surgery has recommended postpyloric feedings be held due to the presence of pneumatosis. Mansouraty who recommends against any endoscopic intervention at this time due to gastric pneumatosis and immature wall of the pancreas fluid collection. PEJ to be considered when enteral feeding can be safely resumed. Pharmacy consulted to initiate TPN in the interim.  Per patient, had mild flushing and headache overnight, resolved without needing interventions. No issues with nausea/vomiting. No BM since 8/15 per charting and confirmation by patient. Still good UOP per patient.   Glucose / Insulin : no hx DM, CBGs < 150 with no insulin  needed Electrolytes: Na 137 K 4.3, Cl 104, CoCa 9.4, Phos 4.2, Mg 2.0 Renal: Scr < 1, BUN 10 Hepatic: AST/ALT wnl, alb 3.2, Tbili 0.6, TG 96 (recently elevated 08/2023) Intake / Output; MIVF: LBM 8/16, UOP not charted  GI Imaging: 8/02 CT: severe sequelae of advanced pancreatitis 8/14 MRCP: acute pancreatitis, severe pancreatic ductal dilatation, concerning for ulceration to the bowel lumen 8/18 CT: gas density between the intraluminal gastric fluid and the wall of the stomach in the posterior and lateral portions of the fundus, concerning for pneumatosis 8/21 CT abd shows no evidence of gastric pneumatosis, stable multilocular cystic process anterior to the pancreatic head  GI Surgeries / Procedures:  None since starting TPN  Central access: 01/29/24 TPN start date:  01/29/24  Nutritional Goals: Goal TPN rate is 90 mL/hr (provides 108 g of protein and 2120 kcals per day)  RD Assessment: Estimated Needs Total Energy Estimated Needs: 2000-2350 kcal Total Protein Estimated Needs: 105-140 g Total Fluid Estimated Needs: >/=2L  Current Nutrition:  8/20 NPO, TPN 8/22 TPN, CLD 8/23 FLD  Plan:  Continue TPN to 90 mL/hr at 1800 to provide 100% nutritional needs Electrolytes in TPN: Na 73mEq/L, K 14mEq/L, Ca 15mEq/L, Mg 74mEq/L, and Phos 15mmol/L. Cl:Ac 1:1 Add standard MVI and trace elements to TPN Continue Sensitive q6h SSI and adjust as needed  MIVF to 10 mL/hr at 1800  Monitor TPN labs on Mon/Thurs, daily PRN  Thank you for allowing pharmacy to be a part of this patient's care.  Shelba Collier, PharmD, BCPS Clinical Pharmacist

## 2024-02-01 NOTE — Progress Notes (Signed)
 PROGRESS NOTE    [name] [name]  FMW:[id number] DOB: February 27, 1991 DOA: 01/11/2024 PCP: Lucius Krabbe, NP   Brief Narrative:  As per prior documentation: 33 year old male with medical history significant for hypertension, s/p mechanical aortic valve replacement 09/27/21 on chronic warfarin anticoagulation, recurrent pancreatitis with multiple hospitalizations, asthma, GERD, prior history of alcohol use disorder (abstinent for 10 months PTA), presented to the hospital on 01/11/2024 with complaints of abdominal pain and admitted for AKI, pancreatitis with pseudocyst.  GI was consulted, patient was treated supportively with n.p.o., core track with postpyloric feeding.  Core track was dislodged on 8/18, briefly treated with liquid diet.  CT A/P on 8/18 showed gastric pneumatosis, general surgery was consulted, octreotide  stopped, patient unable to tolerate NG tube placement, made n.p.o. again, PICC line placed and starting TPN on 8/20.  CT abdomen pelvis on 8/21 shows no further pneumatosis and per general surgery he is okay for dietary advancement.  He continues to remain on TPN and likely will still need this on discharge given uncertain oral intake.  TOC states that this can be arranged by Tuesday of next week.  Currently monitoring with gradual advancement in dietary intake.  General surgery and GI following.  02/01/2024: Patient seen alongside patient's mother.  No new complaints.  Tolerating NG tube.  Input from GI and surgery team is highly appreciated.  Assessment & Plan:   Principal Problem:   Acute on chronic pancreatitis (HCC) Active Problems:   S/P AVR (aortic valve replacement) and aortoplasty   Essential hypertension   Pseudocyst of pancreas   On pre-exposure prophylaxis for HIV   History of asthma   History of alcohol abuse   Protein-calorie malnutrition, severe   Intraabdominal fluid collection  Assessment and Plan:   Acute on chronic pancreatitis, recurrent  pancreatitis Acute fluid collection/pseudocyst Gastric pneumatosis As per chart review, EUS by Dr. Wilhelmenia in 07/2023 that showed cystic lesion in the head of the pancreas consistent with a pseudocyst and a 5 mm stone within the pancreatic duct at the junction of the head/neck.  Cytology of the cyst was benign.  He was scheduled for an ERCP with attempted stone extraction in September with Dr. Wilhelmenia but now admitted with abdominal pain. [facility] was consulted, patient was treated supportively with n.p.o., IVF, core track with postpyloric feeding (initially started on 8/6).  Core track was dislodged on 8/18, briefly treated with liquid diet and core track was replaced.   CT A/P on 8/18 showed gastric pneumatosis, general surgery was consulted, octreotide  stopped (to avoid dysmotility), patient unable to tolerate NG tube placement, made n.p.o. again/tube feeds held, gentle IVF continuing, PICC line placed and starting TPN on 8/20. As per [facility] follow-up, okay for dietary advancement given CT abdomen and pelvis without further pneumatosis on 8/21.  Advancing to full liquid diet today.  Continue TPN and will need home arrangements on discharge, hopefully early next week.  Will need to discontinue cortrack prior to discharge. 02/01/2024: No new changes.  Continue with the above plans.   S/p mechanical aortic valve replacement and aortoplasty INR is 1.2, subtherapeutic (goal INR 1.5-2) Continue warfarin per pharmacy and will initiate IV heparin  bridge until INR is therapeutic Continue aspirin  81 mg daily. Follows with Dr. Wendel, Cardiology and the [facility] 02/01/2024: INR of 1.2 today.  Continue heparin  bridge and Coumadin  therapy.   Essential hypertension Controlled on metoprolol  IV 2.5 mg every 6 hours.   Alcohol use disorder, in remission Stable   Asthma Stable  Anemia of Chronic Disease Stable.   Small intestinal bacterial overgrowth (SIBO) Completed 14 days of rifaximin .   Resolved.   Nutrition Status: Nutrition Problem: Severe Malnutrition Etiology: vomiting, chronic illness, altered GI function Signs/Symptoms: moderate fat depletion, severe muscle depletion Interventions: Refer to RD note for recommendations Agree with registered dietitian evaluation and management recommendations.   Body mass index is 21.15 kg/m.    DVT prophylaxis: Warfarin Code Status: Full Family Communication: None at bedside Disposition Plan:  Status is: Inpatient Remains inpatient appropriate because: Need for IV medications.   Consultants:  GI General surgery  Procedures:  PICC line placement 8/20  Antimicrobials:  Anti-infectives (From admission, onward)    Start     Dose/Rate Route Frequency Ordered Stop   01/18/24 1230  rifaximin  (XIFAXAN ) tablet 550 mg  Status:  Discontinued        550 mg Oral 3 times daily 01/18/24 1132 01/26/24 1323       Subjective: No new complaints.  Objective: Vitals:   02/01/24 0401 02/01/24 0500 02/01/24 0831 02/01/24 1223  BP: 108/70  111/76 (!) 128/90  Pulse: 75  78 81  Resp: 18  17 17   Temp: 98.2 F (36.8 C)  98 F (36.7 C) 97.8 F (36.6 C)  TempSrc:      SpO2: 98%  100% 100%  Weight:  66.7 kg    Height:        Intake/Output Summary (Last 24 hours) at 02/01/2024 1542 Last data filed at 02/01/2024 0300 Gross per 24 hour  Intake 23.5 ml  Output 0 ml  Net 23.5 ml   Filed Weights   01/27/24 0500 01/31/24 0500 02/01/24 0500  Weight: 68.8 kg 66.5 kg 66.7 kg    Examination:  General exam: Patient is thin.  Not in any distress.  NG tube in place.   Respiratory system: Clear to auscultation.  Cardiovascular system: S1 & S2 heard  Gastrointestinal system: Abdomen is soft Central nervous system: Alert and awake Extremities: No edema   Data Reviewed: I have personally reviewed following labs and imaging studies  CBC: Recent Labs  Lab 01/27/24 0229 01/30/24 0500 01/31/24 0422 02/01/24 0244  WBC 7.6 5.5  5.0 4.3  HGB 10.6* 9.9* 10.2* 10.6*  HCT 31.7* 29.6* 31.4* 32.8*  MCV 87.3 87.1 86.3 88.2  PLT 354 252 272 284   Basic Metabolic Panel: Recent Labs  Lab 01/27/24 0229 01/29/24 0209 01/30/24 0500 01/31/24 0422 02/01/24 0244  NA 138 137 140 141 137  K 4.0 3.7 3.7 3.8 4.3  CL 99 103 102 103 104  CO2 30 27 25 26 25   GLUCOSE 104* 99 123* 139* 128*  BUN 10 7 10 9 10   CREATININE 0.86 0.78 0.69 0.60* 0.55*  CALCIUM  9.3 8.6* 8.6* 8.8* 8.9  MG  --  2.0 1.9 2.0 2.0  PHOS  --  3.6 4.0 4.0 4.2   GFR: Estimated Creatinine Clearance: 123.9 mL/min (A) (by C-G formula based on SCr of 0.55 mg/dL (L)). Liver Function Tests: Recent Labs  Lab 01/27/24 0229 01/29/24 0209 01/30/24 0500  AST 17 14* 15  ALT 22 16 15   ALKPHOS 72 83 85  BILITOT 0.5 0.5 0.6  PROT 6.6 6.4* 6.3*  ALBUMIN  3.3* 3.2* 3.2*   Recent Labs  Lab 01/27/24 0229  LIPASE 344*   No results for input(s): AMMONIA in the last 168 hours. Coagulation Profile: Recent Labs  Lab 01/28/24 0413 01/29/24 0209 01/30/24 0500 01/31/24 0422 02/01/24 0244  INR 2.1* 1.4* 1.5* 1.2  1.2   Cardiac Enzymes: No results for input(s): CKTOTAL, CKMB, CKMBINDEX, TROPONINI in the last 168 hours. BNP (last 3 results) No results for input(s): PROBNP in the last 8760 hours. HbA1C: No results for input(s): HGBA1C in the last 72 hours. CBG: Recent Labs  Lab 01/31/24 2015 02/01/24 0004 02/01/24 0402 02/01/24 0827 02/01/24 1222  GLUCAP 154* 115* 139* 111* 125*   Lipid Profile: No results for input(s): CHOL, HDL, LDLCALC, TRIG, CHOLHDL, LDLDIRECT in the last 72 hours.  Thyroid Function Tests: No results for input(s): TSH, T4TOTAL, FREET4, T3FREE, THYROIDAB in the last 72 hours. Anemia Panel: No results for input(s): VITAMINB12, FOLATE, FERRITIN, TIBC, IRON, RETICCTPCT in the last 72 hours. Sepsis Labs: No results for input(s): PROCALCITON, LATICACIDVEN in the last 168 hours.  No  results found for this or any previous visit (from the past 240 hours).       Radiology Studies: No results found.       Scheduled Meds:  aspirin  EC  81 mg Oral Daily   Chlorhexidine  Gluconate Cloth  6 each Topical Daily   enoxaparin  (LOVENOX ) injection  60 mg Subcutaneous BID   feeding supplement  1 Container Oral BID BM   insulin  aspart  0-9 Units Subcutaneous Q6H   lipase/protease/amylase  72,000 Units Oral TID AC   metoprolol  succinate  12.5 mg Oral QHS   pantoprazole  (PROTONIX ) IV  40 mg Intravenous Q12H   polyethylene glycol  17 g Oral Daily   sodium chloride  flush  3 mL Intravenous Q12H   warfarin  4.5 mg Oral ONCE-1600   Warfarin - Pharmacist Dosing Inpatient   Does not apply q1600   Continuous Infusions:  TPN ADULT (ION) 90 mL/hr at 01/31/24 1728   TPN ADULT (ION)       LOS: 21 days    Time spent: 35 minutes    Leatrice LILLETTE Chapel, MD [facility]  If 7PM-7AM, please contact night-coverage [email or web address] 02/01/2024, 3:42 PM

## 2024-02-02 DIAGNOSIS — K861 Other chronic pancreatitis: Secondary | ICD-10-CM | POA: Diagnosis not present

## 2024-02-02 DIAGNOSIS — E43 Unspecified severe protein-calorie malnutrition: Secondary | ICD-10-CM | POA: Diagnosis not present

## 2024-02-02 DIAGNOSIS — K859 Acute pancreatitis without necrosis or infection, unspecified: Secondary | ICD-10-CM | POA: Diagnosis not present

## 2024-02-02 DIAGNOSIS — K863 Pseudocyst of pancreas: Secondary | ICD-10-CM | POA: Diagnosis not present

## 2024-02-02 DIAGNOSIS — R188 Other ascites: Secondary | ICD-10-CM | POA: Diagnosis not present

## 2024-02-02 LAB — GLUCOSE, CAPILLARY
Glucose-Capillary: 120 mg/dL — ABNORMAL HIGH (ref 70–99)
Glucose-Capillary: 146 mg/dL — ABNORMAL HIGH (ref 70–99)
Glucose-Capillary: 148 mg/dL — ABNORMAL HIGH (ref 70–99)
Glucose-Capillary: 156 mg/dL — ABNORMAL HIGH (ref 70–99)

## 2024-02-02 LAB — BASIC METABOLIC PANEL WITH GFR
Anion gap: 7 (ref 5–15)
BUN: 10 mg/dL (ref 6–20)
CO2: 27 mmol/L (ref 22–32)
Calcium: 9 mg/dL (ref 8.9–10.3)
Chloride: 104 mmol/L (ref 98–111)
Creatinine, Ser: 0.55 mg/dL — ABNORMAL LOW (ref 0.61–1.24)
GFR, Estimated: >60 mL/min (ref >60)
Glucose, Bld: 144 mg/dL — ABNORMAL HIGH (ref 70–99)
Potassium: 4.2 mmol/L (ref 3.5–5.1)
Sodium: 138 mmol/L (ref 135–145)

## 2024-02-02 LAB — CBC
HCT: 33.8 % — ABNORMAL LOW (ref 39.0–52.0)
Hemoglobin: 11.1 g/dL — ABNORMAL LOW (ref 13.0–17.0)
MCH: 28.7 pg (ref 26.0–34.0)
MCHC: 32.8 g/dL (ref 30.0–36.0)
MCV: 87.3 fL (ref 80.0–100.0)
Platelets: 282 K/uL (ref 150–400)
RBC: 3.87 MIL/uL — ABNORMAL LOW (ref 4.22–5.81)
RDW: 13.7 % (ref 11.5–15.5)
WBC: 5.1 K/uL (ref 4.0–10.5)
nRBC: 0 % (ref 0.0–0.2)

## 2024-02-02 LAB — PHOSPHORUS: Phosphorus: 4.1 mg/dL (ref 2.5–4.6)

## 2024-02-02 LAB — MAGNESIUM: Magnesium: 2.1 mg/dL (ref 1.7–2.4)

## 2024-02-02 LAB — PROTIME-INR
INR: 1.3 — ABNORMAL HIGH (ref 0.8–1.2)
Prothrombin Time: 16.5 s — ABNORMAL HIGH (ref 11.4–15.2)

## 2024-02-02 MED ORDER — WARFARIN SODIUM 4 MG PO TABS
4.5000 mg | ORAL_TABLET | Freq: Once | ORAL | Status: AC
  Administered 2024-02-02: 4.5 mg via ORAL
  Filled 2024-02-02: qty 1

## 2024-02-02 MED ORDER — TRAVASOL 10 % IV SOLN
INTRAVENOUS | Status: AC
  Filled 2024-02-02: qty 1080

## 2024-02-02 NOTE — Progress Notes (Signed)
 PHARMACY - ANTICOAGULATION CONSULT NOTE  Pharmacy Consult for Warfarin Indication: On-X mechanical AVR  Allergies  Allergen Reactions   Amoxicillin Hives   Patient Measurements: Height: 5' 11 (180.3 cm) Weight: 66.9 kg (147 lb 7.8 oz) IBW/kg (Calculated) : 75.3 HEPARIN  DW (KG): 71  Vital Signs: Temp: 98.7 F (37.1 C) (08/24 0437) BP: 120/83 (08/24 0437) Pulse Rate: 88 (08/24 0437)  Labs: Recent Labs    01/31/24 0422 02/01/24 0244 02/02/24 0551  HGB 10.2* 10.6* 11.1*  HCT 31.4* 32.8* 33.8*  PLT 272 284 282  LABPROT 15.7* 15.4* 16.5*  INR 1.2 1.2 1.3*  CREATININE 0.60* 0.55* 0.55*   Estimated Creatinine Clearance: 124.3 mL/min (A) (by C-G formula based on SCr of 0.55 mg/dL (L)).  Assessment: 33 yo M presenting with pancreatitis. Warfarin PTA regimen is 6 mg Monday and Thursday and 4.5 mg all other days (total weekly dose: 34.5 mg).  It has been difficult to maintain INR within the narrow 1.5-2 goal range, especially in previous days due to issues with enteral intake.   On 8/23, INR remained subtherapeutic at 1.2 on variable warfarin dosing. Previously supratherapeutic with less than home dose of warfarin, although was having issues with nutrition intake and patient is now receiving TPN to meet nutritional needs.   8/24 Update: INR increased slightly but remains subtherapeutic at 1.3. Hgb stable and plt WNL. No issues with bleeding documented. Continuing to receive enoxaparin  bridge for subtherapeutic INR.   Goal of Therapy:  INR 1.5-2.0 Monitor platelets by anticoagulation protocol: Yes   Plan:  Give warfarin 4.5 mg x 1 today.  Continue enoxaparin  bridge for subtherapeutic INR.  Continue to monitor PT/INR, CBC, and s/sx of bleeding daily.   Maurilio Patten, PharmD PGY1 Pharmacy Resident [facility] 02/02/2024 8:20 AM

## 2024-02-02 NOTE — Progress Notes (Signed)
 Nutrition Brief Note    RD team received a consult to start a calorie count to assess pt PO intake fr possible J-tube placement. Pt currently on TPN, diet was advanced to solid diet today. Discussed starting calorie count with RN, requested RN to hang envelope on the door. RD team will follow-up with calorie count on Monday.    Nestora Glatter RD, LDN Clinical Dietitian

## 2024-02-02 NOTE — Progress Notes (Signed)
 PROGRESS NOTE    [name] [name]  FMW:[id number] DOB: Sep 16, 1990 DOA: 01/11/2024 PCP: Lucius Krabbe, NP   Brief Narrative:  As per prior documentation: 33 year old male with medical history significant for hypertension, s/p mechanical aortic valve replacement 09/27/21 on chronic warfarin anticoagulation, recurrent pancreatitis with multiple hospitalizations, asthma, GERD, prior history of alcohol use disorder (abstinent for 10 months PTA), presented to the hospital on 01/11/2024 with complaints of abdominal pain and admitted for AKI, pancreatitis with pseudocyst.  GI was consulted, patient was treated supportively with n.p.o., core track with postpyloric feeding.  Core track was dislodged on 8/18, briefly treated with liquid diet.  CT A/P on 8/18 showed gastric pneumatosis, general surgery was consulted, octreotide  stopped, patient unable to tolerate NG tube placement, made n.p.o. again, PICC line placed and starting TPN on 8/20.  CT abdomen pelvis on 8/21 shows no further pneumatosis and per general surgery he is okay for dietary advancement.  He continues to remain on TPN and likely will still need this on discharge given uncertain oral intake.  TOC states that this can be arranged by Tuesday of next week.  Currently monitoring with gradual advancement in dietary intake.  General surgery and GI following.  02/01/2024: Patient seen alongside patient's mother.  No new complaints.  Tolerating NG tube.  Input from GI and surgery team is highly appreciated. 02/02/2024: Patient seen alongside patient's nurse.  Cortrack tube has been discontinued.  Patient is tolerating clear liquids.  Likely discharge back home in 2 days time.  Patient will need TPN on discharge.  Assessment & Plan:   Principal Problem:   Acute on chronic pancreatitis (HCC) Active Problems:   S/P AVR (aortic valve replacement) and aortoplasty   Essential hypertension   Pseudocyst of pancreas   On pre-exposure  prophylaxis for HIV   History of asthma   History of alcohol abuse   Protein-calorie malnutrition, severe   Intraabdominal fluid collection  Assessment and Plan:   Acute on chronic pancreatitis, recurrent pancreatitis Acute fluid collection/pseudocyst Gastric pneumatosis As per chart review, EUS by Dr. Wilhelmenia in 07/2023 that showed cystic lesion in the head of the pancreas consistent with a pseudocyst and a 5 mm stone within the pancreatic duct at the junction of the head/neck.  Cytology of the cyst was benign.  He was scheduled for an ERCP with attempted stone extraction in September with Dr. Wilhelmenia but now admitted with abdominal pain. [facility] was consulted, patient was treated supportively with n.p.o., IVF, core track with postpyloric feeding (initially started on 8/6).  Core track was dislodged on 8/18, briefly treated with liquid diet and core track was replaced.   CT A/P on 8/18 showed gastric pneumatosis, general surgery was consulted, octreotide  stopped (to avoid dysmotility), patient unable to tolerate NG tube placement, made n.p.o. again/tube feeds held, gentle IVF continuing, PICC line placed and starting TPN on 8/20. As per [facility] follow-up, okay for dietary advancement given CT abdomen and pelvis without further pneumatosis on 8/21.  Advancing to full liquid diet today.  Continue TPN and will need home arrangements on discharge, hopefully early next week.  Will need to discontinue cortrack prior to discharge. 02/02/2024: No new changes.  Continue with the above plans.   S/p mechanical aortic valve replacement and aortoplasty INR is 1.2, subtherapeutic (goal INR 1.5-2) Continue warfarin per pharmacy and will initiate IV heparin  bridge until INR is therapeutic Continue aspirin  81 mg daily. Follows with Dr. Wendel, Cardiology and the [facility] 02/02/2024: INR of 1.3  today.  Continue heparin  bridge and Coumadin  therapy.   Essential hypertension -Continue metoprolol   succinate 12.5 mg p.o. nightly.     Alcohol use disorder, in remission Stable   Asthma Stable   Anemia of Chronic Disease Stable.   Small intestinal bacterial overgrowth (SIBO) Completed 14 days of rifaximin .  Resolved.   Nutrition Status: Nutrition Problem: Severe Malnutrition Etiology: vomiting, chronic illness, altered GI function Signs/Symptoms: moderate fat depletion, severe muscle depletion Interventions: Refer to RD note for recommendations Agree with registered dietitian evaluation and management recommendations.   Body mass index is 20.57 kg/m.    DVT prophylaxis: Warfarin Code Status: Full Family Communication: None at bedside Disposition Plan:  Status is: Inpatient Remains inpatient appropriate because: Need for IV medications.   Consultants:  GI General surgery  Procedures:  PICC line placement 8/20  Antimicrobials:  Anti-infectives (From admission, onward)    Start     Dose/Rate Route Frequency Ordered Stop   01/18/24 1230  rifaximin  (XIFAXAN ) tablet 550 mg  Status:  Discontinued        550 mg Oral 3 times daily 01/18/24 1132 01/26/24 1323       Subjective: No new complaints.  Objective: Vitals:   02/02/24 0014 02/02/24 0435 02/02/24 0437 02/02/24 0832  BP: 115/72  120/83 118/70  Pulse: 78  88 72  Resp: 16  16 18   Temp: 98.4 F (36.9 C)  98.7 F (37.1 C) 98 F (36.7 C)  TempSrc:      SpO2: 98%  99% 100%  Weight:  66.9 kg    Height:       No intake or output data in the 24 hours ending 02/02/24 1122  Filed Weights   01/31/24 0500 02/01/24 0500 02/02/24 0435  Weight: 66.5 kg 66.7 kg 66.9 kg    Examination:  General exam: Patient is thin.  Not in any distress.  NG tube in place.   Respiratory system: Clear to auscultation.  Cardiovascular system: S1 & S2 heard  Gastrointestinal system: Abdomen is soft Central nervous system: Alert and awake Extremities: No edema   Data Reviewed: I have personally reviewed following labs  and imaging studies  CBC: Recent Labs  Lab 01/27/24 0229 01/30/24 0500 01/31/24 0422 02/01/24 0244 02/02/24 0551  WBC 7.6 5.5 5.0 4.3 5.1  HGB 10.6* 9.9* 10.2* 10.6* 11.1*  HCT 31.7* 29.6* 31.4* 32.8* 33.8*  MCV 87.3 87.1 86.3 88.2 87.3  PLT 354 252 272 284 282   Basic Metabolic Panel: Recent Labs  Lab 01/29/24 0209 01/30/24 0500 01/31/24 0422 02/01/24 0244 02/02/24 0551  NA 137 140 141 137 138  K 3.7 3.7 3.8 4.3 4.2  CL 103 102 103 104 104  CO2 27 25 26 25 27   GLUCOSE 99 123* 139* 128* 144*  BUN 7 10 9 10 10   CREATININE 0.78 0.69 0.60* 0.55* 0.55*  CALCIUM  8.6* 8.6* 8.8* 8.9 9.0  MG 2.0 1.9 2.0 2.0 2.1  PHOS 3.6 4.0 4.0 4.2 4.1   GFR: Estimated Creatinine Clearance: 124.3 mL/min (A) (by C-G formula based on SCr of 0.55 mg/dL (L)). Liver Function Tests: Recent Labs  Lab 01/27/24 0229 01/29/24 0209 01/30/24 0500  AST 17 14* 15  ALT 22 16 15   ALKPHOS 72 83 85  BILITOT 0.5 0.5 0.6  PROT 6.6 6.4* 6.3*  ALBUMIN  3.3* 3.2* 3.2*   Recent Labs  Lab 01/27/24 0229  LIPASE 344*   No results for input(s): AMMONIA in the last 168 hours. Coagulation Profile: Recent Labs  Lab 01/29/24 0209 01/30/24 0500 01/31/24 0422 02/01/24 0244 02/02/24 0551  INR 1.4* 1.5* 1.2 1.2 1.3*   Cardiac Enzymes: No results for input(s): CKTOTAL, CKMB, CKMBINDEX, TROPONINI in the last 168 hours. BNP (last 3 results) No results for input(s): PROBNP in the last 8760 hours. HbA1C: No results for input(s): HGBA1C in the last 72 hours. CBG: Recent Labs  Lab 02/01/24 1222 02/01/24 1621 02/01/24 2052 02/02/24 0039 02/02/24 0613  GLUCAP 125* 155* 129* 120* 146*   Lipid Profile: No results for input(s): CHOL, HDL, LDLCALC, TRIG, CHOLHDL, LDLDIRECT in the last 72 hours.  Thyroid Function Tests: No results for input(s): TSH, T4TOTAL, FREET4, T3FREE, THYROIDAB in the last 72 hours. Anemia Panel: No results for input(s): VITAMINB12, FOLATE,  FERRITIN, TIBC, IRON, RETICCTPCT in the last 72 hours. Sepsis Labs: No results for input(s): PROCALCITON, LATICACIDVEN in the last 168 hours.  No results found for this or any previous visit (from the past 240 hours).       Radiology Studies: No results found.       Scheduled Meds:  aspirin  EC  81 mg Oral Daily   Chlorhexidine  Gluconate Cloth  6 each Topical Daily   enoxaparin  (LOVENOX ) injection  60 mg Subcutaneous BID   feeding supplement  1 Container Oral TID WC   insulin  aspart  0-9 Units Subcutaneous Q6H   lipase/protease/amylase  72,000 Units Oral TID AC   metoprolol  succinate  12.5 mg Oral QHS   pantoprazole  (PROTONIX ) IV  40 mg Intravenous Q12H   polyethylene glycol  17 g Oral Daily   sodium chloride  flush  3 mL Intravenous Q12H   warfarin  4.5 mg Oral ONCE-1600   Warfarin - Pharmacist Dosing Inpatient   Does not apply q1600   Continuous Infusions:  TPN ADULT (ION) 90 mL/hr at 02/01/24 1848     LOS: 22 days    Time spent: 35 minutes    Leatrice LILLETTE Chapel, MD [facility]  If 7PM-7AM, please contact night-coverage [email or web address] 02/02/2024, 11:22 AM

## 2024-02-02 NOTE — Progress Notes (Cosign Needed)
 Nasogastric tube removal as per MD order, patient tolerated well.

## 2024-02-02 NOTE — Plan of Care (Signed)
   Problem: Health Behavior/Discharge Planning: Goal: Ability to manage health-related needs will improve Outcome: Progressing   Problem: Clinical Measurements: Goal: Ability to maintain clinical measurements within normal limits will improve Outcome: Progressing Goal: Will remain free from infection Outcome: Progressing Goal: Diagnostic test results will improve Outcome: Progressing Goal: Respiratory complications will improve Outcome: Progressing Goal: Cardiovascular complication will be avoided Outcome: Progressing   Problem: Education: Goal: Knowledge of General Education information will improve Description: Including pain rating scale, medication(s)/side effects and non-pharmacologic comfort measures Outcome: Progressing

## 2024-02-02 NOTE — Plan of Care (Signed)

## 2024-02-02 NOTE — Progress Notes (Signed)
 PHARMACY - TOTAL PARENTERAL NUTRITION CONSULT NOTE   Indication: severe pancreatitis and anticipated prolong NPO status  Patient Measurements: Height: 5' 11 (180.3 cm) Weight: 66.9 kg (147 lb 7.8 oz) IBW/kg (Calculated) : 75.3 TPN AdjBW (KG): 71 Body mass index is 20.57 kg/m. Usual Weight: 66 kg  Assessment:  33 y.o. year old male with a past medical history significant for chronic pancreatitis, history of aortic valve replacement requiring chronic anticoagulation, SIBO s/p course of rifaximin . Surgery has recommended postpyloric feedings be held due to the presence of pneumatosis. Mansouraty who recommends against any endoscopic intervention at this time due to gastric pneumatosis and immature wall of the pancreas fluid collection. PEJ to be considered when enteral feeding can be safely resumed. Pharmacy consulted to initiate TPN in the interim.  Per patient, had mild flushing and headache overnight, resolved without needing interventions. No issues with nausea/vomiting. No BM since 8/15 per charting and confirmation by patient. Still good UOP per patient.   Glucose / Insulin : no hx DM, CBGs < 150 with no insulin  needed Electrolytes: Na 138 K 4.2, Cl 104, CoCa 9.4, Phos 4.1, Mg 2.1 Renal: Scr < 1, BUN 10 Hepatic: AST/ALT wnl, alb 3.2, Tbili 0.6, TG 96 (recently elevated 08/2023) Intake / Output; MIVF: LBM 8/22, UOP not charted  GI Imaging: 8/02 CT: severe sequelae of advanced pancreatitis 8/14 MRCP: acute pancreatitis, severe pancreatic ductal dilatation, concerning for ulceration to the bowel lumen 8/18 CT: gas density between the intraluminal gastric fluid and the wall of the stomach in the posterior and lateral portions of the fundus, concerning for pneumatosis 8/21 CT abd shows no evidence of gastric pneumatosis, stable multilocular cystic process anterior to the pancreatic head  GI Surgeries / Procedures:  None since starting TPN  Central access: 01/29/24 TPN start date:  01/29/24  Nutritional Goals: Goal TPN rate is 90 mL/hr (provides 108 g of protein and 2120 kcals per day)  RD Assessment: Estimated Needs Total Energy Estimated Needs: 2000-2350 kcal Total Protein Estimated Needs: 105-140 g Total Fluid Estimated Needs: >/=2L  Current Nutrition:  8/20 NPO, TPN 8/22 TPN, CLD 8/23 TPN, FLD  Plan:  Continue TPN to 90 mL/hr at 1800 to provide 100% nutritional needs Electrolytes in TPN: Na 80mEq/L, K 24mEq/L, Ca 45mEq/L, Mg 5mEq/L, and Phos 15mmol/L. Cl:Ac 1:1 Add standard MVI and trace elements to TPN Continue Sensitive q6h SSI and adjust as needed  MIVF to 10 mL/hr at 1800  Monitor TPN labs on Mon/Thurs, daily PRN  Thank you for allowing pharmacy to be a part of this patient's care.  Shelba Collier, PharmD, BCPS Clinical Pharmacist

## 2024-02-02 NOTE — Progress Notes (Signed)
 Daily Progress Note  DOA: 01/11/2024 Hospital Day: 74  Cc: acute on chronic pancreatitis with new pseudocyst   ASSESSMENT    Patient Profile: 33 y.o. year old male with a past medical history including but not necessarily limited to chronic pancreatitis, history of aortic valve replacement requiring chronic anticoagulation, SIBO. He has had a prolonged admission for complicated acute on chronic pancreatitis.   Acute on chronic pancreatitis complicated by acute fluid collections / pseudocysts Gastric pneumatosis s/p resolution Weight loss and malnutrition secondary Started on TPN 8/20.  Repeat CT AP 8/21 showed resolution of gastric pneumatosis, stable multilocular cystic process anterior to the pancreatic head consistent with pseudocysts. Patient has been attempting to increase his PO intake but this has been challenging in the setting of ongoing abdominal pain. I spoke with surgery today about the idea of J-tube placement and they wanted some formal evidence that he was not able to tolerate enough PO intake to sustain his nutrition. Thus will request that nutrition start a calorie count. Will remove his Cortrak since he is already on TPN therapy. Patient has stated to me that he would prefer to have J-tube instead of TPN if possible since this has better outcomes in the setting of pancreatitis.   AVR, chronically anticoagulated with warfarin   SIBO Positive outpatient SIBO test, s/p course of rifaximin  here in the hospital   Principal Problem:   Acute on chronic pancreatitis (HCC) Active Problems:   S/P AVR (aortic valve replacement) and aortoplasty   Essential hypertension   Pseudocyst of pancreas   On pre-exposure prophylaxis for HIV   History of asthma   History of alcohol abuse   Protein-calorie malnutrition, severe   Intraabdominal fluid collection   PLAN   --Continue TPN --Okay to remove Cortrak tube since patient is already on TPN --Will advance diet to low  fat --Will start calorie count as requested by surgery to see if he would qualify for J-tube placement --Continue Creon  --MiraLAX  17 g daily --Surgery following for consideration of J-tube placement --Our office will arrange for outpatient follow up with GI. Additionally will need a CT scan 1 week prior to his scheduled ERCP with Dr. Wilhelmenia in September ( spoke with Dr. Wilhelmenia and he recommended keeping ERCP as scheduled for next month).    Subjective   Had some abdominal pain overnight. Was not able to eat breakfast as a result. He does not think that he would be able to maintain his PO nutrition by eating.  Objective     Recent Labs    01/31/24 0422 02/01/24 0244 02/02/24 0551  WBC 5.0 4.3 5.1  HGB 10.2* 10.6* 11.1*  HCT 31.4* 32.8* 33.8*  MCV 86.3 88.2 87.3  PLT 272 284 282   No results for input(s): FOLATE, VITAMINB12, FERRITIN, TIBC, IRONPCTSAT in the last 72 hours. Recent Labs    01/31/24 0422 02/01/24 0244 02/02/24 0551  NA 141 137 138  K 3.8 4.3 4.2  CL 103 104 104  CO2 26 25 27   GLUCOSE 139* 128* 144*  BUN 9 10 10   CREATININE 0.60* 0.55* 0.55*  CALCIUM  8.8* 8.9 9.0   No results for input(s): PROT, ALBUMIN , AST, ALT, ALKPHOS, BILITOT, BILIDIR, IBILI in the last 72 hours.    Imaging:  CT ABDOMEN PELVIS W CONTRAST EXAM: CT ABDOMEN AND PELVIS WITH CONTRAST 01/30/2024 01:52:00 PM  TECHNIQUE: CT of the abdomen and pelvis was performed with the administration of intravenous contrast. Multiplanar reformatted images are provided for  review. Automated exposure control, iterative reconstruction, and/or weight-based adjustment of the mA/kV was utilized to reduce the radiation dose to as low as reasonably achievable.  COMPARISON: 01/27/2024  CLINICAL HISTORY: Gastric pneumatosis.  FINDINGS:  LOWER CHEST: Minimal dependent atelectasis at the left lung base.  LIVER: Normal size and contour.  GALLBLADDER AND BILE  DUCTS: No wall thickening. No cholelithiasis. No biliary ductal dilatation.  SPLEEN: Normal size. No focal lesion.  PANCREAS: Scattered coarse calcifications in the pancreatic head and body as before. Dilated pancreatic duct. Multilocular cystic process anterior to the pancreatic head, abutting the gastric antrum, measured approximately 4.2 cm maximum in transverse diameter, relatively stable from prior study, consistent with pseudocysts.  ADRENAL GLANDS: Normal appearance. No mass.  KIDNEYS, URETERS AND BLADDER: No stones in the kidneys or ureters. No hydronephrosis. No perinephric or periureteral stranding. Urinary bladder is unremarkable.  GI AND BOWEL: Stomach is decompressed. No pneumatosis. Normal appendix.  PERITONEUM AND RETROPERITONEUM: No ascites. No free air.  VASCULATURE: Tapered narrowing of the splenic vein in the midline. Moderate narrowing of the superior mesenteric vein near the portal - splenic confluence without definite venous collateral filling.  LYMPH NODES: No lymphadenopathy.  REPRODUCTIVE ORGANS: No significant abnormality.  BONES AND SOFT TISSUES: Sternotomy wires. Feeding tube to the proximal jejunum. No acute osseous abnormality. No focal soft tissue abnormality.  IMPRESSION: 1. No evidence of gastric pneumatosis. 2. Stable multilocular cystic process anterior to the pancreatic head, consistent with pseudocysts. 3. Tapered narrowing of the splenic vein in the midline and moderate narrowing of the superior mesenteric vein near the portal-splenic confluence without definite venous collaterals.  Electronically signed by: Dayne Hassell MD 01/30/2024 04:40 PM EDT RP Workstation: [id number]     Scheduled inpatient medications:   aspirin  EC  81 mg Oral Daily   Chlorhexidine  Gluconate Cloth  6 each Topical Daily   enoxaparin  (LOVENOX ) injection  60 mg Subcutaneous BID   feeding supplement  1 Container Oral TID WC   insulin  aspart  0-9  Units Subcutaneous Q6H   lipase/protease/amylase  72,000 Units Oral TID AC   metoprolol  succinate  12.5 mg Oral QHS   pantoprazole  (PROTONIX ) IV  40 mg Intravenous Q12H   polyethylene glycol  17 g Oral Daily   sodium chloride  flush  3 mL Intravenous Q12H   warfarin  4.5 mg Oral ONCE-1600   Warfarin - Pharmacist Dosing Inpatient   Does not apply q1600   Continuous inpatient infusions:   TPN ADULT (ION) 90 mL/hr at 02/01/24 1848   TPN ADULT (ION)     PRN inpatient medications: albuterol , alum & mag hydroxide-simeth, HYDROmorphone  (DILAUDID ) injection, hydrOXYzine , morphine  injection, ondansetron  **OR** ondansetron  (ZOFRAN ) IV, oxyCODONE -acetaminophen , phenol, sodium chloride  flush  Vital signs in last 24 hours: Temp:  [97.8 F (36.6 C)-98.7 F (37.1 C)] 98.4 F (36.9 C) (08/24 1457) Pulse Rate:  [72-93] 77 (08/24 1457) Resp:  [16-19] 17 (08/24 1457) BP: (115-123)/(70-83) 116/78 (08/24 1457) SpO2:  [98 %-100 %] 100 % (08/24 1457) Weight:  [66.9 kg] 66.9 kg (08/24 0435) Last BM Date : 01/31/24 No intake or output data in the 24 hours ending 02/02/24 1540   Intake/Output from previous day: No intake/output data recorded. Intake/Output this shift: No intake/output data recorded.   Physical Exam:  General: Alert male in NAD Heart:  Regular rate and rhythm.  Pulmonary: Normal respiratory effort Abdomen: Soft, nondistended, non-tender. Normal bowel sounds. Extremities: No lower extremity edema  Neurologic: Alert and oriented Psych: Pleasant. Cooperative   LOS: 22 days  Rosario JAYSON Kidney, MD  02/02/2024, 3:40 PM

## 2024-02-03 DIAGNOSIS — K859 Acute pancreatitis without necrosis or infection, unspecified: Secondary | ICD-10-CM | POA: Diagnosis not present

## 2024-02-03 DIAGNOSIS — E43 Unspecified severe protein-calorie malnutrition: Secondary | ICD-10-CM | POA: Diagnosis not present

## 2024-02-03 DIAGNOSIS — K863 Pseudocyst of pancreas: Secondary | ICD-10-CM | POA: Diagnosis not present

## 2024-02-03 DIAGNOSIS — K861 Other chronic pancreatitis: Secondary | ICD-10-CM | POA: Diagnosis not present

## 2024-02-03 LAB — CBC
HCT: 33 % — ABNORMAL LOW (ref 39.0–52.0)
Hemoglobin: 10.7 g/dL — ABNORMAL LOW (ref 13.0–17.0)
MCH: 28.4 pg (ref 26.0–34.0)
MCHC: 32.4 g/dL (ref 30.0–36.0)
MCV: 87.5 fL (ref 80.0–100.0)
Platelets: 308 K/uL (ref 150–400)
RBC: 3.77 MIL/uL — ABNORMAL LOW (ref 4.22–5.81)
RDW: 13.6 % (ref 11.5–15.5)
WBC: 6.3 K/uL (ref 4.0–10.5)
nRBC: 0 % (ref 0.0–0.2)

## 2024-02-03 LAB — COMPREHENSIVE METABOLIC PANEL WITH GFR
ALT: 34 U/L (ref 0–44)
AST: 29 U/L (ref 15–41)
Albumin: 3.2 g/dL — ABNORMAL LOW (ref 3.5–5.0)
Alkaline Phosphatase: 134 U/L — ABNORMAL HIGH (ref 38–126)
Anion gap: 6 (ref 5–15)
BUN: 12 mg/dL (ref 6–20)
CO2: 27 mmol/L (ref 22–32)
Calcium: 9 mg/dL (ref 8.9–10.3)
Chloride: 103 mmol/L (ref 98–111)
Creatinine, Ser: 0.54 mg/dL — ABNORMAL LOW (ref 0.61–1.24)
GFR, Estimated: >60 mL/min (ref >60)
Glucose, Bld: 133 mg/dL — ABNORMAL HIGH (ref 70–99)
Potassium: 4 mmol/L (ref 3.5–5.1)
Sodium: 136 mmol/L (ref 135–145)
Total Bilirubin: 0.2 mg/dL (ref 0.0–1.2)
Total Protein: 6.8 g/dL (ref 6.5–8.1)

## 2024-02-03 LAB — GLUCOSE, CAPILLARY
Glucose-Capillary: 138 mg/dL — ABNORMAL HIGH (ref 70–99)
Glucose-Capillary: 128 mg/dL — ABNORMAL HIGH (ref 70–99)
Glucose-Capillary: 129 mg/dL — ABNORMAL HIGH (ref 70–99)
Glucose-Capillary: 111 mg/dL — ABNORMAL HIGH (ref 70–99)

## 2024-02-03 LAB — MAGNESIUM: Magnesium: 2.1 mg/dL (ref 1.7–2.4)

## 2024-02-03 LAB — TRIGLYCERIDES: Triglycerides: 179 mg/dL — ABNORMAL HIGH (ref <150)

## 2024-02-03 LAB — PHOSPHORUS: Phosphorus: 4.4 mg/dL (ref 2.5–4.6)

## 2024-02-03 LAB — PROTIME-INR
INR: 1.5 — ABNORMAL HIGH (ref 0.8–1.2)
Prothrombin Time: 18.8 s — ABNORMAL HIGH (ref 11.4–15.2)

## 2024-02-03 MED ORDER — TRAVASOL 10 % IV SOLN
INTRAVENOUS | Status: AC
  Filled 2024-02-03: qty 1296

## 2024-02-03 MED ORDER — WARFARIN SODIUM 4 MG PO TABS
4.5000 mg | ORAL_TABLET | Freq: Once | ORAL | Status: AC
  Administered 2024-02-03: 4.5 mg via ORAL
  Filled 2024-02-03: qty 1

## 2024-02-03 NOTE — Progress Notes (Signed)
 Nutrition Follow-up  DOCUMENTATION CODES:   Severe malnutrition in context of chronic illness  INTERVENTION:  -Strongly recommend PEJ tube placement and post-pyloric EN at home to provide adequate kcal/pro intake.   -Per MD, continue calorie count (see assessment below)   Per pharmacy:  Continue TPN to 90 mL/hr at 1800 to provide 100% nutritional needs (provides 130 g of protein and 2100 kcals per day) Reduced lipids and increased protein to compensate d/t elevation in triglycerides Electrolytes in TPN: Na 47mEq/L, K 37mEq/L, Ca 44mEq/L, Mg 18mEq/L, and Phos 15mmol/L. Cl:Ac 1:1 Add standard MVI and trace elements to TPN Continue Sensitive q6h SSI and adjust as needed  MIVF to 10 mL/hr at 1800  BMET 8/26 AM labs Monitor TPN labs on Mon/Thurs, daily PRN   NUTRITION DIAGNOSIS:   Severe Malnutrition related to vomiting, chronic illness, altered GI function as evidenced by moderate fat depletion, severe muscle depletion.  Ongoing  GOAL:   Patient will meet greater than or equal to 90% of their needs  Met with TPN  MONITOR:   PO intake, Weight trends, TF tolerance, Supplement acceptance, Diet advancement, Labs, Skin  REASON FOR ASSESSMENT:   New TF, Diagnosis    ASSESSMENT:   Hx hypertension, bicuspid aortic valve with severe aortic insufficiency and moderate aortic stenosis s/p mechanical aortic valve replacement in arthroplasty on 09/27/2021 on chronic anticoagulation, recurrent pancreatitis, asthma, GERD, and prior history of alcohol abuse presents with abdominal pain.  Spoke to pt at bedside. Pt upgraded to [facility] diet, tolerating some liquid PO intake, though, did vomit last night. Pt states he feels ok this morning. He has been unable to tolerate solid foods thus far, strongly recommend PEJ tube placement and post-pyloric EN at home to provide adequate kcal/pro intake. Pt was tolerating [place] enteral nutrition prior to NPO status from GI; Enteral nutrition remains most  appropriate v home TPN. Pt is very active at home, has active job and has had significant weight loss and severe protein calorie malnutrition. He will need even more kcal/pro when he resumes home activity levels.   Calorie count initiated by MD yesterday, no information saved by nursing. CC sign hung on door this morning by this RD. If unable to tolerate solid foods, strongly suspect pt will not meet nutritional needs with liquids alone. If pt ate 100% of all the items he has received in the last 24 hrs, he still would of only consumed 864 kcal and 5.8 g protein. Which is 43% of kcal minimum and only 5.5% of his protein needs. Did not consume any ONS ordered.   TPN running at goal, adjusted this morning by Pharmacy to account for increased triglyceride levels. Now is receiving 130 g protein and 2100 kcal, meeting 100% of pt's needs.   NUTRITION - FOCUSED PHYSICAL EXAM:  Flowsheet Row Most Recent Value  Orbital Region Moderate depletion  Upper Arm Region Moderate depletion  Thoracic and Lumbar Region Moderate depletion  Buccal Region Moderate depletion  Temple Region Moderate depletion  Clavicle Bone Region Severe depletion  Clavicle and Acromion Bone Region Severe depletion  Scapular Bone Region Moderate depletion  Dorsal Hand Moderate depletion  Patellar Region Moderate depletion  Anterior Thigh Region Moderate depletion  Posterior Calf Region Moderate depletion  Edema (RD Assessment) None  Hair Reviewed  Eyes Reviewed  Mouth Reviewed  Skin Reviewed  Nails Reviewed    Diet Order:   Diet Order             Diet Heart Fluid consistency:  Thin  Diet effective now                   EDUCATION NEEDS:   Education needs have been addressed  Skin:  Skin Assessment: Reviewed RN Assessment  Last BM:  8/12 type1  Height:   Ht Readings from Last 1 Encounters:  01/11/24 5' 11 (1.803 m)    Weight:   Wt Readings from Last 1 Encounters:  02/02/24 66.9 kg    BMI:  Body  mass index is 20.57 kg/m.  Estimated Nutritional Needs:   Kcal:  2000-2350 kcal  Protein:  105-140 g  Fluid:  >/=2L  Skyelar Swigart Daml-Budig, RDN, LDN Registered Dietitian Nutritionist RD Inpatient Contact Info in [place]

## 2024-02-03 NOTE — TOC Progression Note (Signed)
 Transition of Care [facility]) - Progression Note    Patient Details  Name: [name] [name] MRN: [id number] Date of Birth: [birth year]  [facility] CM/SW Contact  [name] JONELLE Joe, RN Phone Number: 02/03/2024, 4:16 PM  Clinical Narrative:    CM spoke with GI MD, hospitalist and surgery this morning to determine plan for the patient.  Patient is being followed by services to determine if patient will discharge home with TPN versus PEJ placement.  Holley Herring, RNCM with [employer] is following as well.  Patient has Right arm PICC line at this time.  Patient verbalized to the MD that he is anxious to return to work and would prefer not having TPN for nutrition at home if possible.  Chaplain was called to completed HCPOA paperwork at the bedside today.  MD provided a jury duty excuse note and patient was given 3 copies at the bedside and was advised to mail to the court since no fax # was on the court letter.  CM with [facility] Care management will continue to follow the patient for [facility] needs to return home when stable.                     Expected Discharge Plan and Services                                               Social Drivers of Health (SDOH) Interventions SDOH Screenings   Food Insecurity: No Food Insecurity (01/11/2024)  Housing: Low Risk  (01/11/2024)  Transportation Needs: No Transportation Needs (01/11/2024)  Utilities: Not At Risk (01/11/2024)  Depression (PHQ2-9): Medium Risk (09/09/2023)  Financial Resource Strain: Low Risk  (04/25/2023)  Physical Activity: Sufficiently Active (04/25/2023)  Social Connections: Unknown (04/25/2023)  Stress: No Stress Concern Present (04/25/2023)  Tobacco Use: Low Risk  (01/20/2024)    Readmission Risk Interventions    01/29/2024   12:53 PM 01/15/2024   11:03 AM  Readmission Risk Prevention Plan  Post Dischage Appt  Complete  Medication Screening  Complete  Transportation Screening Complete  Complete  PCP or Specialist Appt within 5-7 Days Complete   Home Care Screening Complete   Medication Review (RN CM) Complete

## 2024-02-03 NOTE — Progress Notes (Signed)
 PROGRESS NOTE    [name] [name]  FMW:[id number] DOB: 09-08-1990 DOA: 01/11/2024 PCP: Lucius Krabbe, NP   Brief Narrative:  Patient is a 33 year old male with past medical history significant for hypertension, s/p mechanical aortic valve replacement 09/27/21 on chronic warfarin anticoagulation, recurrent pancreatitis with multiple hospitalizations, asthma, GERD, prior history of alcohol use disorder (abstinent for 10 months PTA).  Patient presented to the hospital on 01/11/2024 with complaints of abdominal pain.  Workup revealed and had episode of pancreatitis, with pseudocyst and acute kidney injury.  CT A/P done on 8/18 revealed gastric pneumatosis, general surgery was consulted, octreotide  stopped.  Patient was unable to tolerate NG tube placement, made n.p.o. again, PICC line was placed and patient was started on TPN on 8/20.  CT abdomen pelvis on 01/30/2024 revealed resolution of the pneumatosis.  General surgery team has okayed dietary advancement.  GI team is assisting in patient's management.  Calorie count is in process, as patient has lost significant amount of weight.  Patient is felt to be malnourished.  The plan is to discharge patient home on 02/04/2024 with TPN.  Patient will follow-up with GI and surgery team on discharge.    02/02/2024: Patient seen alongside patient's nurse.  Cortrack tube has been discontinued.  Patient is tolerating clear liquids.  Likely discharge back home tomorrow.  Patient will continue TPN on discharge.  02/03/2024: Patient seen.  Caloric count is in progress.  Patient will continue TPN on discharge.  Assessment & Plan:   Principal Problem:   Acute on chronic pancreatitis (HCC) Active Problems:   S/P AVR (aortic valve replacement) and aortoplasty   Essential hypertension   Pseudocyst of pancreas   On pre-exposure prophylaxis for HIV   History of asthma   History of alcohol abuse   Protein-calorie malnutrition, severe   Intraabdominal  fluid collection  Assessment and Plan:   Acute on chronic pancreatitis, recurrent pancreatitis Acute fluid collection/pseudocyst Gastric pneumatosis -As per chart review, EUS by Dr. Wilhelmenia in 07/2023 that showed cystic lesion at the head of the pancreas consistent with a pseudocyst and a 5 mm stone within the pancreatic duct at the junction of the head/neck.   -Cytology of the cyst was benign.  He was scheduled for an ERCP with attempted stone extraction in September with Dr. Wilhelmenia but now admitted with abdominal pain. -[facility] was consulted, patient was treated supportively with n.p.o., IVF, core track with postpyloric feeding (initially started on 8/6).  Core track was dislodged on 8/18, briefly treated with liquid diet and core track was replaced.   -CT A/P on 8/18 showed gastric pneumatosis, general surgery was consulted, octreotide  stopped (to avoid dysmotility), patient was unable to tolerate NG tube placement, made n.p.o. again/tube feeds held, gentle IVF continuing, PICC line placed and TPN started on 01/29/2024. -Pneumatosis has resolved.  Cortrack tube has been removed.  Patient is on TPN. - Input from GI surgical team is highly appreciated. - Patient will discharge back home tomorrow.  Patient is tolerating clear liquid diet.  Diet is being advanced as tolerated.  Caloric count is in progress.  TPN on discharge.   S/p mechanical aortic valve replacement and aortoplasty -INR is 1.5 today. -Subcutaneous Lovenox  bridge.   -Continue warfarin per pharmacy  -Continue aspirin  81 mg daily. -Follows with Dr. Wendel, Cardiology and the [facility]   Essential hypertension -Continue metoprolol  succinate 12.5 mg p.o. nightly.     Alcohol use disorder, in remission Stable   Asthma Stable   Anemia  of Chronic Disease Stable.   Small intestinal bacterial overgrowth (SIBO) Completed 14 days of rifaximin .  Resolved.   Nutrition Status: Nutrition Problem: Severe  Malnutrition Calorie count to see progress.   Tolerating clear liquids. Continue TPN. TPN on discharge.. Body mass index is 20.57 kg/m.    DVT prophylaxis: Warfarin Code Status: Full Family Communication: None at bedside Disposition Plan:  Status is: Inpatient Remains inpatient appropriate because: Need for IV medications.   Consultants:  GI General surgery  Procedures:  PICC line placement 8/20  Antimicrobials:  Anti-infectives (From admission, onward)    Start     Dose/Rate Route Frequency Ordered Stop   01/18/24 1230  rifaximin  (XIFAXAN ) tablet 550 mg  Status:  Discontinued        550 mg Oral 3 times daily 01/18/24 1132 01/26/24 1323       Subjective: No new complaints.  Objective: Vitals:   02/02/24 1941 02/02/24 2352 02/03/24 0354 02/03/24 0734  BP: 102/73 109/68 106/65 105/67  Pulse: 80 64 63 69  Resp: 16 15 16 18   Temp: 98.2 F (36.8 C) 98.3 F (36.8 C) 97.7 F (36.5 C) 98 F (36.7 C)  TempSrc: Oral Oral Oral   SpO2: 100% 99% 98% 99%  Weight:      Height:        Intake/Output Summary (Last 24 hours) at 02/03/2024 0931 Last data filed at 02/02/2024 1802 Gross per 24 hour  Intake 2081.31 ml  Output --  Net 2081.31 ml    Filed Weights   01/31/24 0500 02/01/24 0500 02/02/24 0435  Weight: 66.5 kg 66.7 kg 66.9 kg    Examination:  General exam: Patient is thin.  Not in any distress.  NG tube in place.   Respiratory system: Clear to auscultation.  Cardiovascular system: S1 & S2 heard  Gastrointestinal system: Abdomen is soft Central nervous system: Alert and awake Extremities: No edema   Data Reviewed: I have personally reviewed following labs and imaging studies  CBC: Recent Labs  Lab 01/30/24 0500 01/31/24 0422 02/01/24 0244 02/02/24 0551 02/03/24 0500  WBC 5.5 5.0 4.3 5.1 6.3  HGB 9.9* 10.2* 10.6* 11.1* 10.7*  HCT 29.6* 31.4* 32.8* 33.8* 33.0*  MCV 87.1 86.3 88.2 87.3 87.5  PLT 252 272 284 282 308   Basic Metabolic  Panel: Recent Labs  Lab 01/30/24 0500 01/31/24 0422 02/01/24 0244 02/02/24 0551 02/03/24 0500  NA 140 141 137 138 136  K 3.7 3.8 4.3 4.2 4.0  CL 102 103 104 104 103  CO2 25 26 25 27 27   GLUCOSE 123* 139* 128* 144* 133*  BUN 10 9 10 10 12   CREATININE 0.69 0.60* 0.55* 0.55* 0.54*  CALCIUM  8.6* 8.8* 8.9 9.0 9.0  MG 1.9 2.0 2.0 2.1 2.1  PHOS 4.0 4.0 4.2 4.1 4.4   GFR: Estimated Creatinine Clearance: 124.3 mL/min (A) (by C-G formula based on SCr of 0.54 mg/dL (L)). Liver Function Tests: Recent Labs  Lab 01/29/24 0209 01/30/24 0500 02/03/24 0500  AST 14* 15 29  ALT 16 15 34  ALKPHOS 83 85 134*  BILITOT 0.5 0.6 0.2  PROT 6.4* 6.3* 6.8  ALBUMIN  3.2* 3.2* 3.2*   No results for input(s): LIPASE, AMYLASE in the last 168 hours.  No results for input(s): AMMONIA in the last 168 hours. Coagulation Profile: Recent Labs  Lab 01/30/24 0500 01/31/24 0422 02/01/24 0244 02/02/24 0551 02/03/24 0500  INR 1.5* 1.2 1.2 1.3* 1.5*   Cardiac Enzymes: No results for input(s): CKTOTAL, CKMB, CKMBINDEX,  TROPONINI in the last 168 hours. BNP (last 3 results) No results for input(s): PROBNP in the last 8760 hours. HbA1C: No results for input(s): HGBA1C in the last 72 hours. CBG: Recent Labs  Lab 02/02/24 0613 02/02/24 1208 02/02/24 2341 02/03/24 0553 02/03/24 0733  GLUCAP 146* 148* 156* 138* 128*   Lipid Profile: Recent Labs    02/03/24 0500  TRIG 179*    Thyroid Function Tests: No results for input(s): TSH, T4TOTAL, FREET4, T3FREE, THYROIDAB in the last 72 hours. Anemia Panel: No results for input(s): VITAMINB12, FOLATE, FERRITIN, TIBC, IRON, RETICCTPCT in the last 72 hours. Sepsis Labs: No results for input(s): PROCALCITON, LATICACIDVEN in the last 168 hours.  No results found for this or any previous visit (from the past 240 hours).       Radiology Studies: No results found.       Scheduled Meds:  aspirin  EC  81  mg Oral Daily   Chlorhexidine  Gluconate Cloth  6 each Topical Daily   enoxaparin  (LOVENOX ) injection  60 mg Subcutaneous BID   feeding supplement  1 Container Oral TID WC   insulin  aspart  0-9 Units Subcutaneous Q6H   lipase/protease/amylase  72,000 Units Oral TID AC   metoprolol  succinate  12.5 mg Oral QHS   pantoprazole  (PROTONIX ) IV  40 mg Intravenous Q12H   polyethylene glycol  17 g Oral Daily   sodium chloride  flush  3 mL Intravenous Q12H   Warfarin - Pharmacist Dosing Inpatient   Does not apply q1600   Continuous Infusions:  TPN ADULT (ION) 90 mL/hr at 02/02/24 1802     LOS: 23 days    Time spent: 35 minutes    Leatrice LILLETTE Chapel, MD [facility]  If 7PM-7AM, please contact night-coverage [email or web address] 02/03/2024, 9:31 AM

## 2024-02-03 NOTE — Progress Notes (Signed)
 PHARMACY - TOTAL PARENTERAL NUTRITION CONSULT NOTE   Indication: severe pancreatitis and anticipated prolong NPO status  Patient Measurements: Height: 5' 11 (180.3 cm) Weight: 66.9 kg (147 lb 7.8 oz) IBW/kg (Calculated) : 75.3 TPN AdjBW (KG): 71 Body mass index is 20.57 kg/m. Usual Weight: 66 kg  Assessment:  33 y.o. year old male with a past medical history significant for chronic pancreatitis, history of aortic valve replacement requiring chronic anticoagulation, SIBO s/p course of rifaximin . Surgery has recommended postpyloric feedings be held due to the presence of pneumatosis. Mansouraty who recommends against any endoscopic intervention at this time due to gastric pneumatosis and immature wall of the pancreas fluid collection. PEJ to be considered when enteral feeding can be safely resumed. Pharmacy consulted to initiate TPN in the interim.  Per patient, had mild flushing and headache overnight, resolved without needing interventions. No issues with nausea/vomiting. No BM since 8/15 per charting and confirmation by patient. Still good UOP per patient.   Glucose / Insulin : no hx DM, CBGs < 160; 4 units insulin  given in prior 24 hours Electrolytes: Na 136 K 4.0, Cl 103, CoCa 9.4, Phos 4.4, Mg 2.1 Renal: Scr < 1, BUN 10 Hepatic: AST/ALT wnl, alb 3.2, Tbili 0.2, TG 179 [goal <150] Intake / Output; MIVF: LBM 8/22, UOP not charted  GI Imaging: 8/02 CT: severe sequelae of advanced pancreatitis 8/14 MRCP: acute pancreatitis, severe pancreatic ductal dilatation, concerning for ulceration to the bowel lumen 8/18 CT: gas density between the intraluminal gastric fluid and the wall of the stomach in the posterior and lateral portions of the fundus, concerning for pneumatosis 8/21 CT abd shows no evidence of gastric pneumatosis, stable multilocular cystic process anterior to the pancreatic head  GI Surgeries / Procedures:  None since starting TPN  Central access: 01/29/24 TPN start date:  01/29/24  Nutritional Goals: Goal TPN rate is 90 mL/hr (provides 130 g of protein and 2100 kcals per day)  RD Assessment: Estimated Needs Total Energy Estimated Needs: 2000-2350 kcal Total Protein Estimated Needs: 105-140 g Total Fluid Estimated Needs: >/=2L  Current Nutrition:  8/20 NPO, TPN 8/22 TPN, CLD 8/23 TPN, FLD  Plan:  Continue TPN to 90 mL/hr at 1800 to provide 100% nutritional needs Reduced lipids and increased protein to compensate d/t elevation in triglycerides Electrolytes in TPN: Na 69mEq/L, K 80mEq/L, Ca 46mEq/L, Mg 17mEq/L, and Phos 15mmol/L. Cl:Ac 1:1 Add standard MVI and trace elements to TPN Continue Sensitive q6h SSI and adjust as needed  MIVF to 10 mL/hr at 1800  BMET 8/26 AM labs Monitor TPN labs on Mon/Thurs, daily PRN   Thank you for allowing pharmacy to be a part of this patient's care.   Benedetta Heath BS, PharmD, BCPS Clinical Pharmacist 02/03/2024 9:41 AM  Contact: [phone] after 3 PM

## 2024-02-03 NOTE — Progress Notes (Signed)
   02/03/24 1538  Spiritual Encounters  Type of Visit Initial  Referral source Clinical staff  Reason for visit Advance directives  OnCall Visit No  Spiritual Framework  Presenting Themes Impactful experiences and emotions  Patient Stress Factors Health changes  Family Stress Factors Health changes  Interventions  Spiritual Care Interventions Made Established relationship of care and support;Normalization of emotions;Compassionate presence  Intervention Outcomes  Outcomes Awareness of support;Awareness of health   Chaplain met with Pt and his mother to complete the Advance Directive. Document was finalized and uploaded into the Pt's chart. The original was given to the Pt, and additional copies were provided for his records.

## 2024-02-03 NOTE — Progress Notes (Signed)
 PHARMACY - ANTICOAGULATION CONSULT NOTE  Pharmacy Consult for Warfarin Indication: On-X mechanical AVR  Allergies  Allergen Reactions   Amoxicillin Hives   Patient Measurements: Height: 5' 11 (180.3 cm) Weight: 66.9 kg (147 lb 7.8 oz) IBW/kg (Calculated) : 75.3 HEPARIN  DW (KG): 71  Vital Signs: Temp: 97.8 F (36.6 C) (08/25 1248) Temp Source: Oral (08/25 0354) BP: 120/77 (08/25 1248) Pulse Rate: 82 (08/25 1248)  Labs: Recent Labs    02/01/24 0244 02/02/24 0551 02/03/24 0500  HGB 10.6* 11.1* 10.7*  HCT 32.8* 33.8* 33.0*  PLT 284 282 308  LABPROT 15.4* 16.5* 18.8*  INR 1.2 1.3* 1.5*  CREATININE 0.55* 0.55* 0.54*   Estimated Creatinine Clearance: 124.3 mL/min (A) (by C-G formula based on SCr of 0.54 mg/dL (L)).  Assessment: 33 yo M presenting with pancreatitis. Warfarin PTA regimen is 6 mg Monday and Thursday and 4.5 mg all other days (total weekly dose: 34.5 mg).  It has been difficult to maintain INR within the narrow 1.5-2 goal range, especially in previous days due to issues with enteral intake.   On 8/23, INR remained subtherapeutic at 1.2 on variable warfarin dosing. Previously supratherapeutic with less than home dose of warfarin, although was having issues with nutrition intake and patient is now receiving TPN to meet nutritional needs.   8/25 Update: INR therapeutic at 1.5. Hgb stable and plt WNL. No issues with bleeding documented. Continuing to receive enoxaparin  bridge.   Goal of Therapy:  INR 1.5-2.0 Monitor platelets by anticoagulation protocol: Yes   Plan:  Give warfarin 4.5 mg x 1 today.  Continue enoxaparin  bridge for now.  May discontinue 8/26 if INR remains therapeutic.  Continue to monitor PT/INR, CBC, and s/sx of bleeding daily.    [employer], Pharm.D., BCPS Clinical Pharmacist  **Pharmacist phone directory can be found on [email or web address] listed under [facility].  02/03/2024 3:16 PM

## 2024-02-03 NOTE — Progress Notes (Signed)
 Progress Note     Subjective: Patient reports some epigastric pain. Denies worsening pain. Reports that he has been tolerating heart diet. He did have an episode of emesis last night after having vegetable soup. Tolerated breakfast this morning. Would like to continue to attempt liquids. Had BM yesterday. Reports minimal nausea currently.  ROS  All negative with the exception of above.  Objective: Vital signs in last 24 hours: Temp:  [97.7 F (36.5 C)-98.4 F (36.9 C)] 98 F (36.7 C) (08/25 0734) Pulse Rate:  [63-80] 69 (08/25 0734) Resp:  [15-18] 18 (08/25 0734) BP: (102-117)/(65-78) 105/67 (08/25 0734) SpO2:  [98 %-100 %] 99 % (08/25 0734) Last BM Date : 01/31/24  Intake/Output from previous day: 08/24 0701 - 08/25 0700 In: 2081.3 [I.V.:2081.3] Out: -  Intake/Output this shift: No intake/output data recorded.  PE: General: Pleasant male who is laying in bed in NAD. HEENT: Feeding tube in place and visible at nare. Lungs: Respiratory effort nonlabored Abd: Soft and ND. Mild tenderness to palpation of epigastric region. No rebound tenderness or guarding. Psych: A&Ox3 with an appropriate affect.   Lab Results:  Recent Labs    02/02/24 0551 02/03/24 0500  WBC 5.1 6.3  HGB 11.1* 10.7*  HCT 33.8* 33.0*  PLT 282 308   BMET Recent Labs    02/02/24 0551 02/03/24 0500  NA 138 136  K 4.2 4.0  CL 104 103  CO2 27 27  GLUCOSE 144* 133*  BUN 10 12  CREATININE 0.55* 0.54*  CALCIUM  9.0 9.0   PT/INR Recent Labs    02/02/24 0551 02/03/24 0500  LABPROT 16.5* 18.8*  INR 1.3* 1.5*   CMP     Component Value Date/Time   NA 136 02/03/2024 0500   NA 142 08/30/2021 1430   K 4.0 02/03/2024 0500   CL 103 02/03/2024 0500   CO2 27 02/03/2024 0500   GLUCOSE 133 (H) 02/03/2024 0500   BUN 12 02/03/2024 0500   BUN 13 08/30/2021 1430   CREATININE 0.54 (L) 02/03/2024 0500   CALCIUM  9.0 02/03/2024 0500   PROT 6.8 02/03/2024 0500   ALBUMIN  3.2 (L) 02/03/2024 0500    AST 29 02/03/2024 0500   ALT 34 02/03/2024 0500   ALKPHOS 134 (H) 02/03/2024 0500   BILITOT 0.2 02/03/2024 0500   GFRNONAA >60 02/03/2024 0500   Lipase     Component Value Date/Time   LIPASE 344 (H) 01/27/2024 0229       Studies/Results: No results found.  Anti-infectives: Anti-infectives (From admission, onward)    Start     Dose/Rate Route Frequency Ordered Stop   01/18/24 1230  rifaximin  (XIFAXAN ) tablet 550 mg  Status:  Discontinued        550 mg Oral 3 times daily 01/18/24 1132 01/26/24 1323        Assessment/Plan Acute on chronic pancreatitis Calcifications of head of the pancreas Pancreatic pseudocyst Gastric pneumatosis -Most recent imaging includes: CT abd/pel w contrast 8/21 showing no evidence of gastric pneumatosis. Stable multilocular cystic process anterior to the pancreatic head, consistent with pseudocysts.  -WBC 6.3. HGB 10.7 -Afebrile. Vitals stable. -No calorie count recorded yet. Will follow recordings for further assessment of considering J-tube. -GI continuing to follow. Patient to have CT imaging prior to ERCP that is scheduled for outpatient. -No acute surgical intervention recommended at this time.      FEN: TPN; Feeding supplement (BOOST); Heart diet; IVF per primary team VTE: Enoxaparin  ID: None currently    LOS: 23  days   I reviewed specialist notes, hospitalist notes, last 24 h vitals and pain scores, last 48 h intake and output, last 24 h labs and trends, and last 24 h imaging results.  This care required moderate level of medical decision making.    Marjorie Carlyon Favre, [facility] 02/03/2024, 8:56 AM Please see Amion for pager number during day hours 7:00am-4:30pm

## 2024-02-03 NOTE — Plan of Care (Signed)

## 2024-02-03 NOTE — Progress Notes (Signed)
 Progress Note   Subjective  On full liquids. Had some vomiting yesterday PM, states he feels better today, wanting to try more liquids today. On calorie count per surgey to determine needs for possible J tube placement. He feels strongly he needs a J tube to go home for nutrition and not lose further weight.    Objective   Vital signs in last 24 hours: Temp:  [97.7 F (36.5 C)-98.4 F (36.9 C)] 98 F (36.7 C) (08/25 0734) Pulse Rate:  [63-80] 69 (08/25 0734) Resp:  [15-18] 18 (08/25 0734) BP: (102-118)/(65-78) 105/67 (08/25 0734) SpO2:  [98 %-100 %] 99 % (08/25 0734) Last BM Date : 01/31/24 General:    white male in NAD Neurologic:  Alert and oriented,  grossly normal neurologically. Psych:  Cooperative. Normal mood and affect.  Intake/Output from previous day: 08/24 0701 - 08/25 0700 In: 2081.3 [I.V.:2081.3] Out: -  Intake/Output this shift: No intake/output data recorded.  Lab Results: Recent Labs    02/01/24 0244 02/02/24 0551 02/03/24 0500  WBC 4.3 5.1 6.3  HGB 10.6* 11.1* 10.7*  HCT 32.8* 33.8* 33.0*  PLT 284 282 308   BMET Recent Labs    02/01/24 0244 02/02/24 0551 02/03/24 0500  NA 137 138 136  K 4.3 4.2 4.0  CL 104 104 103  CO2 25 27 27   GLUCOSE 128* 144* 133*  BUN 10 10 12   CREATININE 0.55* 0.55* 0.54*  CALCIUM  8.9 9.0 9.0   LFT Recent Labs    02/03/24 0500  PROT 6.8  ALBUMIN  3.2*  AST 29  ALT 34  ALKPHOS 134*  BILITOT 0.2   PT/INR Recent Labs    02/02/24 0551 02/03/24 0500  LABPROT 16.5* 18.8*  INR 1.3* 1.5*    Studies/Results: No results found.     Assessment / Plan:    33 y/o male here with the following:  Acute on chronic pancreatitis - complications with fluid collections / pseudocyst Gastric pneumatosis - resolved on follow up imaging Malnutrition H/o AVR on chronic coumadin   I received signout from Dr. Federico about this patient.  Prolonged hospitalization for acute on chronic pancreatitis, fluid  collections that are not mature enough for endoscopic drainage.  He has been on TPN for malnourishment, weight loss.  He has had gastric pneumatosis on prior imaging which limited his p.o. intake previously, however that has since resolved on follow-up imaging.  He has advance diet to full liquids.  He strongly feels that he will need a J-tube for nourishment prior to discharge.  Currently on a calorie count to formally document whether or not he meets that requirement.  He had some vomiting last night, feels better this morning, he is willing to resume liquid diet today and see how things go in regards to his calorie count.  Once outpatient, the plan is for repeat CT scan imaging pancreatic protocol around the third week of September with subsequent ERCP (scheduled for 9/29) for assessment of pancreatic ductal disruption and possible pancreatic stent placement.  Will await his calorie count today, defer to surgery about possible J-tube placement prior to discharge.  Upon discharge he is scheduled to see Dr. Wilhelmenia on 9/3 at 1050, CT imaging, will be coordinated at that visit (likely to be done one week prior to ERCP).  ERCP is scheduled as outpatient.  Continue pancreatic enzymes, pain control.  No other new recommendations at this time. Will follow peripherally for now, please call with questions in the interim.  Marcey Naval, MD [facility]

## 2024-02-04 DIAGNOSIS — K859 Acute pancreatitis without necrosis or infection, unspecified: Secondary | ICD-10-CM | POA: Diagnosis not present

## 2024-02-04 DIAGNOSIS — K861 Other chronic pancreatitis: Secondary | ICD-10-CM | POA: Diagnosis not present

## 2024-02-04 LAB — BASIC METABOLIC PANEL WITH GFR
Anion gap: 8 (ref 5–15)
BUN: 14 mg/dL (ref 6–20)
CO2: 26 mmol/L (ref 22–32)
Calcium: 9.1 mg/dL (ref 8.9–10.3)
Chloride: 101 mmol/L (ref 98–111)
Creatinine, Ser: 0.52 mg/dL — ABNORMAL LOW (ref 0.61–1.24)
GFR, Estimated: >60 mL/min (ref >60)
Glucose, Bld: 146 mg/dL — ABNORMAL HIGH (ref 70–99)
Potassium: 4 mmol/L (ref 3.5–5.1)
Sodium: 135 mmol/L (ref 135–145)

## 2024-02-04 LAB — CBC
HCT: 33 % — ABNORMAL LOW (ref 39.0–52.0)
Hemoglobin: 10.8 g/dL — ABNORMAL LOW (ref 13.0–17.0)
MCH: 28.5 pg (ref 26.0–34.0)
MCHC: 32.7 g/dL (ref 30.0–36.0)
MCV: 87.1 fL (ref 80.0–100.0)
Platelets: 287 K/uL (ref 150–400)
RBC: 3.79 MIL/uL — ABNORMAL LOW (ref 4.22–5.81)
RDW: 13.8 % (ref 11.5–15.5)
WBC: 7.3 K/uL (ref 4.0–10.5)
nRBC: 0 % (ref 0.0–0.2)

## 2024-02-04 LAB — PROTIME-INR
INR: 1.5 — ABNORMAL HIGH (ref 0.8–1.2)
Prothrombin Time: 18.6 s — ABNORMAL HIGH (ref 11.4–15.2)

## 2024-02-04 LAB — GLUCOSE, CAPILLARY
Glucose-Capillary: 134 mg/dL — ABNORMAL HIGH (ref 70–99)
Glucose-Capillary: 139 mg/dL — ABNORMAL HIGH (ref 70–99)
Glucose-Capillary: 140 mg/dL — ABNORMAL HIGH (ref 70–99)
Glucose-Capillary: 148 mg/dL — ABNORMAL HIGH (ref 70–99)
Glucose-Capillary: 151 mg/dL — ABNORMAL HIGH (ref 70–99)
Glucose-Capillary: 154 mg/dL — ABNORMAL HIGH (ref 70–99)

## 2024-02-04 MED ORDER — TRAVASOL 10 % IV SOLN
INTRAVENOUS | Status: AC
  Filled 2024-02-04: qty 1296

## 2024-02-04 MED ORDER — WARFARIN SODIUM 4 MG PO TABS
4.5000 mg | ORAL_TABLET | Freq: Once | ORAL | Status: AC
  Administered 2024-02-04: 4.5 mg via ORAL
  Filled 2024-02-04: qty 1

## 2024-02-04 NOTE — TOC Progression Note (Signed)
 Transition of Care [facility]) - Progression Note    Patient Details  Name: [name] [name] MRN: [id number] Date of Birth: 03-15-1991  [facility] CM/SW Contact  [name] JONELLE Joe, RN Phone Number: 02/04/2024, 11:31 AM  Clinical Narrative:    CM spoke with Dr. Vernon and patient continues to advance diet with bedside nursing tracking the patient's calorie count.  Patient has right arm PICC line in place.  Holley Herring, RNCM with [employer] will continue to follow the patient for likely need for TPN.                     Expected Discharge Plan and Services                                               Social Drivers of Health (SDOH) Interventions SDOH Screenings   Food Insecurity: No Food Insecurity (01/11/2024)  Housing: Low Risk  (01/11/2024)  Transportation Needs: No Transportation Needs (01/11/2024)  Utilities: Not At Risk (01/11/2024)  Depression (PHQ2-9): Medium Risk (09/09/2023)  Financial Resource Strain: Low Risk  (04/25/2023)  Physical Activity: Sufficiently Active (04/25/2023)  Social Connections: Unknown (04/25/2023)  Stress: No Stress Concern Present (04/25/2023)  Tobacco Use: Low Risk  (01/20/2024)    Readmission Risk Interventions    01/29/2024   12:53 PM 01/15/2024   11:03 AM  Readmission Risk Prevention Plan  Post Dischage Appt  Complete  Medication Screening  Complete  Transportation Screening Complete Complete  PCP or Specialist Appt within 5-7 Days Complete   Home Care Screening Complete   Medication Review (RN CM) Complete

## 2024-02-04 NOTE — Plan of Care (Signed)

## 2024-02-04 NOTE — Progress Notes (Signed)
 Progress Note     Subjective: Patient reports no pain at rest. He has been on heart healthy diet (Had lunch/protein shake but skipped dinner). He did vomit at 6:30PM and at 4:00AM. Reports some nausea that is being controlled with zofran . Had a bowel movement yesterday. Reports flatulence.  ROS  All negative with the exception of above.  Objective: Vital signs in last 24 hours: Temp:  [97.6 F (36.4 C)-98.3 F (36.8 C)] 97.6 F (36.4 C) (08/26 0729) Pulse Rate:  [70-91] 70 (08/26 0729) Resp:  [17-18] 18 (08/26 0420) BP: (99-121)/(64-79) 99/64 (08/26 0729) SpO2:  [99 %-100 %] 99 % (08/26 0729) Weight:  [65.9 kg] 65.9 kg (08/26 0500) Last BM Date : 02/03/24  Intake/Output from previous day: 08/25 0701 - 08/26 0700 In: 3485.8 [P.O.:540; I.V.:2945.8] Out: -  Intake/Output this shift: No intake/output data recorded.  PE: General: Pleasant male who is laying in bed in NAD. Lungs: Respiratory effort nonlabored Abd: Soft and ND. Mild tenderness to palpation of epigastric region. No rebound tenderness or guarding. Psych: A&Ox3 with an appropriate affect.   Lab Results:  Recent Labs    02/03/24 0500 02/04/24 0417  WBC 6.3 7.3  HGB 10.7* 10.8*  HCT 33.0* 33.0*  PLT 308 287   BMET Recent Labs    02/03/24 0500 02/04/24 0417  NA 136 135  K 4.0 4.0  CL 103 101  CO2 27 26  GLUCOSE 133* 146*  BUN 12 14  CREATININE 0.54* 0.52*  CALCIUM  9.0 9.1   PT/INR Recent Labs    02/03/24 0500 02/04/24 0417  LABPROT 18.8* 18.6*  INR 1.5* 1.5*   CMP     Component Value Date/Time   NA 135 02/04/2024 0417   NA 142 08/30/2021 1430   K 4.0 02/04/2024 0417   CL 101 02/04/2024 0417   CO2 26 02/04/2024 0417   GLUCOSE 146 (H) 02/04/2024 0417   BUN 14 02/04/2024 0417   BUN 13 08/30/2021 1430   CREATININE 0.52 (L) 02/04/2024 0417   CALCIUM  9.1 02/04/2024 0417   PROT 6.8 02/03/2024 0500   ALBUMIN  3.2 (L) 02/03/2024 0500   AST 29 02/03/2024 0500   ALT 34 02/03/2024 0500    ALKPHOS 134 (H) 02/03/2024 0500   BILITOT 0.2 02/03/2024 0500   GFRNONAA >60 02/04/2024 0417   Lipase     Component Value Date/Time   LIPASE 344 (H) 01/27/2024 0229       Studies/Results: No results found.  Anti-infectives: Anti-infectives (From admission, onward)    Start     Dose/Rate Route Frequency Ordered Stop   01/18/24 1230  rifaximin  (XIFAXAN ) tablet 550 mg  Status:  Discontinued        550 mg Oral 3 times daily 01/18/24 1132 01/26/24 1323        Assessment/Plan Acute on chronic pancreatitis Calcifications of head of the pancreas Pancreatic pseudocyst Gastric pneumatosis -Most recent imaging includes: CT abd/pel w contrast 8/21 showing no evidence of gastric pneumatosis. Stable multilocular cystic process anterior to the pancreatic head, consistent with pseudocysts.  -WBC 7.3. HGB 10.8 -Afebrile. Vitals stable. -Calorie count ongoing. Will follow recordings for further decision making regarding surgical options to help with nutritional needs. -No acute surgical intervention recommended at this time. Should patient be discharged home, outpatient follow up has been arranged with Dr. Dasie. -GI continuing to follow. Patient to have CT imaging prior to ERCP that is scheduled for outpatient.   FEN: TPN; Feeding supplement (BOOST); Heart diet; IVF per primary  team VTE: Warfarin ID: None currently   LOS: 24 days   I reviewed specialist notes, hospitalist notes, last 24 h vitals and pain scores, last 48 h intake and output, last 24 h labs and trends, and last 24 h imaging results.  This care required moderate level of medical decision making.    Marjorie Carlyon Favre, [facility] 02/04/2024, 7:51 AM Please see Amion for pager number during day hours 7:00am-4:30pm

## 2024-02-04 NOTE — Progress Notes (Signed)
 PROGRESS NOTE    [name] [name]  FMW:[id number] DOB: [birth year] DOA: 01/11/2024 PCP: Lucius Krabbe, NP   Brief Narrative:  Patient is a 33 year old male with past medical history significant for hypertension, s/p mechanical aortic valve replacement 09/27/21 on chronic warfarin anticoagulation, recurrent pancreatitis with multiple hospitalizations, asthma, GERD, prior history of alcohol use disorder (abstinent for 10 months PTA).  Patient presented to the hospital on 01/11/2024 with complaints of abdominal pain.  Workup revealed and had episode of pancreatitis, with pseudocyst and acute kidney injury.  CT A/P done on 8/18 revealed gastric pneumatosis, general surgery was consulted, octreotide  stopped.  Patient was unable to tolerate NG tube placement, made n.p.o. again, PICC line was placed and patient was started on TPN on 8/20.  CT abdomen pelvis on 01/30/2024 revealed resolution of the pneumatosis.  General surgery team has okayed dietary advancement.  GI team is assisting in patient's management.  Calorie count is in process, as patient has lost significant amount of weight.  Patient is felt to be malnourished.  Patient does not want to go home with TPN, he would rather have J-tube placed before discharge.  General surgery is following, calorie count in progress.  Plan of J-tube based on the calorie count.    Assessment & Plan:   Principal Problem:   Acute on chronic pancreatitis [facility]) Active Problems:   Pseudocyst of pancreas   S/P AVR (aortic valve replacement) and aortoplasty   Essential hypertension   On pre-exposure prophylaxis for HIV   History of asthma   History of alcohol abuse   Protein-calorie malnutrition, severe   Intraabdominal fluid collection  Acute on chronic pancreatitis, recurrent pancreatitis Acute fluid collection/pseudocyst Gastric pneumatosis -As per chart review, EUS by Dr. Wilhelmenia in 07/2023 that showed cystic lesion at the head of the pancreas  consistent with a pseudocyst and a 5 mm stone within the pancreatic duct at the junction of the head/neck.   -Cytology of the cyst was benign.  He was scheduled for an ERCP with attempted stone extraction in September with Dr. Wilhelmenia but now admitted with abdominal pain. -[facility] was consulted, patient was treated supportively with n.p.o., IVF, core track with postpyloric feeding (initially started on 8/6).  Core track was dislodged on 8/18, briefly treated with liquid diet and core track was replaced.   -CT A/P on 8/18 showed gastric pneumatosis, general surgery was consulted, octreotide  stopped (to avoid dysmotility), patient was unable to tolerate NG tube placement, made n.p.o. again/tube feeds held, gentle IVF continuing, PICC line placed and TPN started on 01/29/2024. -Pneumatosis has resolved.  Cortrack tube has been removed.  Patient is on TPN. - Input from GI surgical team is highly appreciated.  Patient is tolerating full liquid diet.  Diet is being advanced as tolerated.  Caloric count is in progress.  Patient wants to be discharged with J-tube instead of TPN.  General surgery following for consideration of possible J-tube based on the calorie counts.   S/p mechanical aortic valve replacement and aortoplasty -INR still subtherapeutic.  Continue bridging Lovenox  and Coumadin  dosing per pharmacy.  Continue aspirin . -Follows with Dr. Wendel, Cardiology and the Coumadin  Clinic   Essential hypertension - Blood pressure controlled.  Continue Toprol -XL 12.5 mg p.o. nightly.     Alcohol use disorder, in remission Stable   Asthma Stable   Anemia of Chronic Disease Stable.   Small intestinal bacterial overgrowth (SIBO) Completed 14 days of rifaximin .  Resolved.   Nutrition Status: Nutrition Problem: Severe Malnutrition Calorie  count to see progress.   Tolerating clear liquids. Continue TPN. TPN on discharge.. Body mass index is 20.57 kg/m.  DVT prophylaxis:  Coumadin /Lovenox    Code Status: Full Code  Family Communication:  None present at bedside.  Plan of care discussed with patient in length and he/she verbalized understanding and agreed with it.  Status is: Inpatient Remains inpatient appropriate because: General surgery doing calorie counts, decision has to be made for J-tube or TPN to go home.   Estimated body mass index is 20.25 kg/m as calculated from the following:   Height as of this encounter: 5' 11 (1.803 m).   Weight as of this encounter: 65.9 kg.    Nutritional Assessment: Body mass index is 20.25 kg/m.SABRA Seen by dietician.  I agree with the assessment and plan as outlined below: Nutrition Status: Nutrition Problem: Severe Malnutrition Etiology: vomiting, chronic illness, altered GI function Signs/Symptoms: moderate fat depletion, severe muscle depletion Interventions: Refer to RD note for recommendations  . Skin Assessment: I have examined the patient's skin and I agree with the wound assessment as performed by the wound care RN as outlined below:    Consultants:  GI and general surgery  Procedures:  As above  Antimicrobials:  Anti-infectives (From admission, onward)    Start     Dose/Rate Route Frequency Ordered Stop   01/18/24 1230  rifaximin  (XIFAXAN ) tablet 550 mg  Status:  Discontinued        550 mg Oral 3 times daily 01/18/24 1132 01/26/24 1323         Subjective: Patient seen and examined.  He has no complaints at the moment.  He told me that he had vomiting last night and since then he has been having intermittent nausea.  He told general surgery though that he had vomited at 4 AM this morning and per RD, he did not eat dinner last night.  RD continues to recommend J-tube.  Patient prefers to go home with J-tube instead of TPN.  He has made his wishes clear to the general surgery.  Objective: Vitals:   02/04/24 0004 02/04/24 0420 02/04/24 0500 02/04/24 0729  BP: 111/74 113/67  99/64  Pulse: 76 71   70  Resp: 18 18    Temp: 97.9 F (36.6 C) 98.3 F (36.8 C)  97.6 F (36.4 C)  TempSrc:    Oral  SpO2: 100% 100%  99%  Weight:   65.9 kg   Height:        Intake/Output Summary (Last 24 hours) at 02/04/2024 0758 Last data filed at 02/04/2024 0308 Gross per 24 hour  Intake 3485.76 ml  Output --  Net 3485.76 ml   Filed Weights   02/01/24 0500 02/02/24 0435 02/04/24 0500  Weight: 66.7 kg 66.9 kg 65.9 kg    Examination:  General exam: Appears calm and comfortable  Respiratory system: Clear to auscultation. Respiratory effort normal. Cardiovascular system: S1 & S2 heard, RRR. No JVD, murmurs, rubs, gallops or clicks. No pedal edema. Gastrointestinal system: Abdomen is nondistended, soft and nontender. No organomegaly or masses felt. Normal bowel sounds heard. Central nervous system: Alert and oriented. No focal neurological deficits. Extremities: Symmetric 5 x 5 power. Skin: No rashes, lesions or ulcers Psychiatry: Judgement and insight appear normal. Mood & affect appropriate.    Data Reviewed: I have personally reviewed following labs and imaging studies  CBC: Recent Labs  Lab 01/31/24 0422 02/01/24 0244 02/02/24 0551 02/03/24 0500 02/04/24 0417  WBC 5.0 4.3 5.1 6.3 7.3  HGB 10.2* 10.6* 11.1* 10.7* 10.8*  HCT 31.4* 32.8* 33.8* 33.0* 33.0*  MCV 86.3 88.2 87.3 87.5 87.1  PLT 272 284 282 308 287   Basic Metabolic Panel: Recent Labs  Lab 01/30/24 0500 01/31/24 0422 02/01/24 0244 02/02/24 0551 02/03/24 0500 02/04/24 0417  NA 140 141 137 138 136 135  K 3.7 3.8 4.3 4.2 4.0 4.0  CL 102 103 104 104 103 101  CO2 25 26 25 27 27 26   GLUCOSE 123* 139* 128* 144* 133* 146*  BUN 10 9 10 10 12 14   CREATININE 0.69 0.60* 0.55* 0.55* 0.54* 0.52*  CALCIUM  8.6* 8.8* 8.9 9.0 9.0 9.1  MG 1.9 2.0 2.0 2.1 2.1  --   PHOS 4.0 4.0 4.2 4.1 4.4  --    GFR: Estimated Creatinine Clearance: 122.4 mL/min (A) (by C-G formula based on SCr of 0.52 mg/dL (L)). Liver Function  Tests: Recent Labs  Lab 01/29/24 0209 01/30/24 0500 02/03/24 0500  AST 14* 15 29  ALT 16 15 34  ALKPHOS 83 85 134*  BILITOT 0.5 0.6 0.2  PROT 6.4* 6.3* 6.8  ALBUMIN  3.2* 3.2* 3.2*   No results for input(s): LIPASE, AMYLASE in the last 168 hours. No results for input(s): AMMONIA in the last 168 hours. Coagulation Profile: Recent Labs  Lab 01/31/24 0422 02/01/24 0244 02/02/24 0551 02/03/24 0500 02/04/24 0417  INR 1.2 1.2 1.3* 1.5* 1.5*   Cardiac Enzymes: No results for input(s): CKTOTAL, CKMB, CKMBINDEX, TROPONINI in the last 168 hours. BNP (last 3 results) No results for input(s): PROBNP in the last 8760 hours. HbA1C: No results for input(s): HGBA1C in the last 72 hours. CBG: Recent Labs  Lab 02/03/24 1316 02/03/24 1639 02/04/24 0028 02/04/24 0522 02/04/24 0728  GLUCAP 129* 111* 151* 154* 139*   Lipid Profile: Recent Labs    02/03/24 0500  TRIG 179*   Thyroid Function Tests: No results for input(s): TSH, T4TOTAL, FREET4, T3FREE, THYROIDAB in the last 72 hours. Anemia Panel: No results for input(s): VITAMINB12, FOLATE, FERRITIN, TIBC, IRON, RETICCTPCT in the last 72 hours. Sepsis Labs: No results for input(s): PROCALCITON, LATICACIDVEN in the last 168 hours.  No results found for this or any previous visit (from the past 240 hours).   Radiology Studies: No results found.  Scheduled Meds:  aspirin  EC  81 mg Oral Daily   Chlorhexidine  Gluconate Cloth  6 each Topical Daily   feeding supplement  1 Container Oral TID WC   insulin  aspart  0-9 Units Subcutaneous Q6H   lipase/protease/amylase  72,000 Units Oral TID AC   metoprolol  succinate  12.5 mg Oral QHS   pantoprazole  (PROTONIX ) IV  40 mg Intravenous Q12H   polyethylene glycol  17 g Oral Daily   sodium chloride  flush  3 mL Intravenous Q12H   warfarin  4.5 mg Oral ONCE-1600   Warfarin - Pharmacist Dosing Inpatient   Does not apply q1600   Continuous  Infusions:  TPN ADULT (ION) 90 mL/hr at 02/04/24 0308     LOS: 24 days   Fredia Skeeter, MD [facility]  02/04/2024, 7:58 AM   *Please note that this is a verbal dictation therefore any spelling or grammatical errors are due to the Dragon Medical One system interpretation.  Please page via Amion and do not message via secure chat for urgent patient care matters. Secure chat can be used for non urgent patient care matters.  How to contact the [facility] Attending or Consulting provider 7A - 7P or covering provider during  after hours 7P -7A, for this patient?  Check the care team in [facility] and look for a) attending/consulting [facility] provider listed and b) the [facility] team listed. Page or secure chat 7A-7P. Log into [email or web address] and use [facility] universal password to access. If you do not have the password, please contact the hospital operator. Locate the [facility] provider you are looking for under [facility] and page to a number that you can be directly reached. If you still have difficulty reaching the provider, please page the [facility] (Director on Call) for the Hospitalists listed on amion for assistance.

## 2024-02-04 NOTE — Consult Note (Signed)
 Nutrition Short Note   -Continue to strongly recommend PEJ tube placement and post-pyloric EN at home to provide adequate kcal/pro intake with inability to meet needs orally as well as previous tolerance of post-pyloric enteral nutrition during this admission.    -Continue TPN to 90 mL/hr at 1800 to provide 100% nutritional needs (provides 130 g of protein and 2100 kcals per day)     ASSESSMENT:    Hx hypertension, bicuspid aortic valve with severe aortic insufficiency and moderate aortic stenosis s/p mechanical aortic valve replacement in arthroplasty on 09/27/2021 on chronic anticoagulation, recurrent pancreatitis, asthma, GERD, and prior history of alcohol abuse presents with abdominal pain.   Spoke to pt at bedside. Pt continues on HH diet, though, has not tolerated any solids foods and is also having difficulty tolerating liquids. He had breakfast and lunch yesterday, then proceeded to vomit at 1830 as well as 0400 this morning. Pt states he feels ok this morning, did have some broth and a couple bites of grits. Continue to strongly recommend PEJ tube placement and post-pyloric EN as most appropriate nutrition support intervention.  Pt was tolerating [place] enteral nutrition prior to NPO status from GI; Enteral nutrition remains most appropriate v home TPN. Pt is very active at home, has active job and has had significant weight loss and severe protein calorie malnutrition. He will need even more kcal/pro when he resumes home activity levels.    Calorie count initiated by MD Sunday. Pt continues to not tolerate solid foods, strongly suspect pt will not meet nutritional needs with liquids alone, necessitating nutrition support. Pt with two meals tickets saved, lunch yesterday ( <124 kcal, <3 g pro) and breakfast yesterday (243 kcal, <0.5 g pro)    TPN running at goal, adjusted this morning by Pharmacy to account for increased triglyceride levels. Now is receiving 130 g protein and 2100 kcal, meeting  100% of pt's needs.   Charmion Hapke Daml-Budig, RDN, LDN Registered Dietitian Nutritionist RD Inpatient Contact Info in [place]

## 2024-02-04 NOTE — Progress Notes (Signed)
 PHARMACY - TOTAL PARENTERAL NUTRITION CONSULT NOTE   Indication: severe pancreatitis and anticipated prolong NPO status  Patient Measurements: Height: 5' 11 (180.3 cm) Weight: 65.9 kg (145 lb 3.2 oz) IBW/kg (Calculated) : 75.3 TPN AdjBW (KG): 71 Body mass index is 20.25 kg/m. Usual Weight: 66 kg  Assessment:  33 y.o. year old male with a past medical history significant for chronic pancreatitis, history of aortic valve replacement requiring chronic anticoagulation, SIBO s/p course of rifaximin . Surgery has recommended postpyloric feedings be held due to the presence of pneumatosis. Mansouraty who recommends against any endoscopic intervention at this time due to gastric pneumatosis and immature wall of the pancreas fluid collection. PEJ to be considered when enteral feeding can be safely resumed. Pharmacy consulted to initiate TPN in the interim.  Per patient, had mild flushing and headache overnight, resolved without needing interventions. No issues with nausea/vomiting. No BM since 8/15 per charting and confirmation by patient. Still good UOP per patient.   Glucose / Insulin : no hx DM, CBGs < 160; 4 units insulin  given in prior 24 hours Electrolytes: Na 135 K 4.0, Cl 101, CoCa 9.74, Phos 4.4, Mg 2.1 Renal: Scr < 1, BUN 14 Hepatic: AST/ALT wnl, alb 3.2, Tbili 0.2, TG 179 [goal <150] Intake / Output:  LBM 8/25, UOP 1+ unmeasured  GI Imaging: 8/02 CT: severe sequelae of advanced pancreatitis 8/14 MRCP: acute pancreatitis, severe pancreatic ductal dilatation, concerning for ulceration to the bowel lumen 8/18 CT: gas density between the intraluminal gastric fluid and the wall of the stomach in the posterior and lateral portions of the fundus, concerning for pneumatosis 8/21 CT abd shows no evidence of gastric pneumatosis, stable multilocular cystic process anterior to the pancreatic head  GI Surgeries / Procedures:  None since starting TPN  Central access: 01/29/24 TPN start date:  01/29/24  Nutritional Goals: Goal TPN rate is 90 mL/hr (provides 130 g of protein and 2100 kcals per day)  RD Assessment: Estimated Needs Total Energy Estimated Needs: 2000-2350 kcal Total Protein Estimated Needs: 105-140 g Total Fluid Estimated Needs: >/=2L  Current Nutrition:  8/20 NPO, TPN 8/22 TPN, CLD 8/23 TPN, FLD  Plan:  Continue TPN to 90 mL/hr at 1800 to provide 100% nutritional needs Reduced lipids and increased protein to compensate d/t elevation in triglycerides [02/03/24 >>] Electrolytes in TPN: Na 69mEq/L, K 32mEq/L, Ca 43mEq/L, Mg 71mEq/L, and Phos 15mmol/L. Cl:Ac 1:1 Add standard MVI and trace elements to TPN Continue Sensitive q6h SSI and adjust as needed  Monitor TPN labs on Mon/Thurs, daily PRN   Thank you for allowing pharmacy to be a part of this patient's care.   Benedetta Heath BS, PharmD, BCPS Clinical Pharmacist 02/04/2024 9:14 AM  Contact: [phone] after 3 PM

## 2024-02-04 NOTE — Progress Notes (Signed)
 PHARMACY - ANTICOAGULATION CONSULT NOTE  Pharmacy Consult for Warfarin Indication: On-X mechanical AVR  Allergies  Allergen Reactions   Amoxicillin Hives   Patient Measurements: Height: 5' 11 (180.3 cm) Weight: 65.9 kg (145 lb 3.2 oz) IBW/kg (Calculated) : 75.3 HEPARIN  DW (KG): 71  Vital Signs: Temp: 97.6 F (36.4 C) (08/26 0729) Temp Source: Oral (08/26 0729) BP: 99/64 (08/26 0729) Pulse Rate: 70 (08/26 0729)  Labs: Recent Labs    02/02/24 0551 02/03/24 0500 02/04/24 0417  HGB 11.1* 10.7* 10.8*  HCT 33.8* 33.0* 33.0*  PLT 282 308 287  LABPROT 16.5* 18.8* 18.6*  INR 1.3* 1.5* 1.5*  CREATININE 0.55* 0.54* 0.52*   Estimated Creatinine Clearance: 122.4 mL/min (A) (by C-G formula based on SCr of 0.52 mg/dL (L)).  Assessment: 33 yo M presenting with pancreatitis. Warfarin PTA regimen is 6 mg Monday and Thursday and 4.5 mg all other days (total weekly dose: 34.5 mg).  It has been difficult to maintain INR within the narrow 1.5-2 goal range, especially in previous days due to issues with enteral intake.   On 8/23, INR remained subtherapeutic at 1.2 on variable warfarin dosing. Previously supratherapeutic with less than home dose of warfarin, although was having issues with nutrition intake and patient is now receiving TPN to meet nutritional needs.   INR therapeutic at 1.5 x2. Hgb stable and plt WNL. No issues with bleeding documented.   Goal of Therapy:  INR 1.5-2.0 Monitor platelets by anticoagulation protocol: Yes   Plan:  Give warfarin 4.5 mg PO x1 dose Stop enoxaparin  - INR therapeutic x2 days Check INR daily while on warfarin Continue to monitor H&H and platelets   Thank you for allowing pharmacy to be a part of this patient's care.  Shelba Collier, PharmD, BCPS Clinical Pharmacist

## 2024-02-05 DIAGNOSIS — K861 Other chronic pancreatitis: Secondary | ICD-10-CM | POA: Diagnosis not present

## 2024-02-05 DIAGNOSIS — K859 Acute pancreatitis without necrosis or infection, unspecified: Secondary | ICD-10-CM | POA: Diagnosis not present

## 2024-02-05 LAB — CBC
HCT: 30.9 % — ABNORMAL LOW (ref 39.0–52.0)
Hemoglobin: 10 g/dL — ABNORMAL LOW (ref 13.0–17.0)
MCH: 28.7 pg (ref 26.0–34.0)
MCHC: 32.4 g/dL (ref 30.0–36.0)
MCV: 88.5 fL (ref 80.0–100.0)
Platelets: 270 K/uL (ref 150–400)
RBC: 3.49 MIL/uL — ABNORMAL LOW (ref 4.22–5.81)
RDW: 13.6 % (ref 11.5–15.5)
WBC: 6.1 K/uL (ref 4.0–10.5)
nRBC: 0 % (ref 0.0–0.2)

## 2024-02-05 LAB — GLUCOSE, CAPILLARY
Glucose-Capillary: 122 mg/dL — ABNORMAL HIGH (ref 70–99)
Glucose-Capillary: 124 mg/dL — ABNORMAL HIGH (ref 70–99)
Glucose-Capillary: 132 mg/dL — ABNORMAL HIGH (ref 70–99)
Glucose-Capillary: 134 mg/dL — ABNORMAL HIGH (ref 70–99)
Glucose-Capillary: 137 mg/dL — ABNORMAL HIGH (ref 70–99)
Glucose-Capillary: 139 mg/dL — ABNORMAL HIGH (ref 70–99)
Glucose-Capillary: 144 mg/dL — ABNORMAL HIGH (ref 70–99)

## 2024-02-05 LAB — PROTIME-INR
INR: 1.4 — ABNORMAL HIGH (ref 0.8–1.2)
Prothrombin Time: 17.9 s — ABNORMAL HIGH (ref 11.4–15.2)

## 2024-02-05 MED ORDER — TRAVASOL 10 % IV SOLN
INTRAVENOUS | Status: AC
  Filled 2024-02-05: qty 1296

## 2024-02-05 MED ORDER — ALPRAZOLAM 0.25 MG PO TABS
0.2500 mg | ORAL_TABLET | Freq: Once | ORAL | Status: AC
  Administered 2024-02-05: 0.25 mg via ORAL
  Filled 2024-02-05: qty 1

## 2024-02-05 MED ORDER — WARFARIN SODIUM 6 MG PO TABS
6.0000 mg | ORAL_TABLET | Freq: Once | ORAL | Status: AC
  Administered 2024-02-05: 6 mg via ORAL
  Filled 2024-02-05: qty 1

## 2024-02-05 NOTE — Procedures (Signed)
 Cortrak  Person Inserting Tube:  Mady Dolly, RD Tube Type:  Cortrak - 55 inches Tube Size:  10 Tube Location:  Right nare Secured by: Bridle Technique Used to Measure Tube Placement:  Marking at nare/corner of mouth Cortrak Secured At:  77 cm   Cortrak Tube Team Note:  Consult received to place a Cortrak feeding tube.   Multiple attempts at post-pyloric Cotrak placement by Cotrak RD x3. Attempts unsuccessful.  Tube bridled in place in gastric position.   Discussed with RN. Tube NOT READY FOR USE. Recommend advancement by radiology to jejunum. Discussed with RD following who is to follow up with MD.   If the tube becomes dislodged please keep the tube and contact the Cortrak team at [email or web address] for replacement.  If after hours and replacement cannot be delayed, place a NG tube and confirm placement with an abdominal x-ray.    Dolly Mady MS, RD, LDN Registered Dietitian Clinical Nutrition RD Inpatient Contact Info in Amion

## 2024-02-05 NOTE — Plan of Care (Signed)

## 2024-02-05 NOTE — Progress Notes (Signed)
 PHARMACY - ANTICOAGULATION CONSULT NOTE  Pharmacy Consult for Warfarin Indication: On-X mechanical AVR  Allergies  Allergen Reactions   Amoxicillin Hives   Patient Measurements: Height: 5' 11 (180.3 cm) Weight: 67.1 kg (147 lb 14.9 oz) IBW/kg (Calculated) : 75.3 HEPARIN  DW (KG): 71  Vital Signs: Temp: 97.5 F (36.4 C) (08/27 0740) Temp Source: Oral (08/27 0445) BP: 96/66 (08/27 0740) Pulse Rate: 72 (08/27 0740)  Labs: Recent Labs    02/03/24 0500 02/04/24 0417 02/05/24 0332  HGB 10.7* 10.8* 10.0*  HCT 33.0* 33.0* 30.9*  PLT 308 287 270  LABPROT 18.8* 18.6* 17.9*  INR 1.5* 1.5* 1.4*  CREATININE 0.54* 0.52*  --    Estimated Creatinine Clearance: 124.6 mL/min (A) (by C-G formula based on SCr of 0.52 mg/dL (L)).  Assessment: 33 yo M presenting with pancreatitis. Warfarin PTA regimen is 6 mg Monday and Thursday and 4.5 mg all other days (total weekly dose: 34.5 mg).  It has been difficult to maintain INR within the narrow 1.5-2 goal range, especially in previous days due to issues with enteral intake.   On 8/23, INR remained subtherapeutic at 1.2 on variable warfarin dosing. Previously supratherapeutic with less than home dose of warfarin, although was having issues with nutrition intake and patient is now receiving TPN to meet nutritional needs.   INR therapeutic at 1.5 x2 now 1.4. Stop enoxaparin  - INR therapeutic x2 days. Hgb stable and plt WNL. No issues with bleeding documented. Will empirically give increased dose 6mg  to prevent further drop of INR.  Goal of Therapy:  INR 1.5-2.0 Monitor platelets by anticoagulation protocol: Yes   Plan:  Give warfarin 6 mg PO x1 dose Check INR daily while on warfarin Continue to monitor H&H and platelets   Thank you for allowing pharmacy to be a part of this patient's care.  Shelba Collier, PharmD, BCPS Clinical Pharmacist

## 2024-02-05 NOTE — Progress Notes (Signed)
 PROGRESS NOTE    [name] [name]  FMW:[id number] DOB: 05/29/1991 DOA: 01/11/2024 PCP: Lucius Krabbe, NP   Brief Narrative:  Patient is a 33 year old male with past medical history significant for hypertension, s/p mechanical aortic valve replacement 09/27/21 on chronic warfarin anticoagulation, recurrent pancreatitis with multiple hospitalizations, asthma, GERD, prior history of alcohol use disorder (abstinent for 10 months PTA).  Patient presented to the hospital on 01/11/2024 with complaints of abdominal pain.  Workup revealed and had episode of pancreatitis, with pseudocyst and acute kidney injury.  CT A/P done on 8/18 revealed gastric pneumatosis, general surgery was consulted, octreotide  stopped.  Patient was unable to tolerate NG tube placement, made n.p.o. again, PICC line was placed and patient was started on TPN on 8/20.  CT abdomen pelvis on 01/30/2024 revealed resolution of the pneumatosis.  General surgery team has okayed dietary advancement.  GI team is assisting in patient's management.  Calorie count is in process, as patient has lost significant amount of weight.  Patient is felt to be malnourished.  Patient does not want to go home with TPN, he would rather have J-tube placed before discharge.  General surgery is following, calorie count in progress.  Plan of J-tube based on the calorie count.    Assessment & Plan:   Principal Problem:   Acute on chronic pancreatitis [facility]) Active Problems:   Pseudocyst of pancreas   S/P AVR (aortic valve replacement) and aortoplasty   Essential hypertension   On pre-exposure prophylaxis for HIV   History of asthma   History of alcohol abuse   Protein-calorie malnutrition, severe   Intraabdominal fluid collection  Acute on chronic pancreatitis, recurrent pancreatitis Acute fluid collection/pseudocyst Gastric pneumatosis -As per chart review, EUS by Dr. Wilhelmenia in 07/2023 that showed cystic lesion at the head of the pancreas  consistent with a pseudocyst and a 5 mm stone within the pancreatic duct at the junction of the head/neck.   -Cytology of the cyst was benign.  He was scheduled for an ERCP with attempted stone extraction in September with Dr. Wilhelmenia but now admitted with abdominal pain. -[facility] was consulted, patient was treated supportively with n.p.o., IVF, core track with postpyloric feeding (initially started on 8/6).  Core track was dislodged on 8/18, briefly treated with liquid diet and core track was replaced.   -CT A/P on 8/18 showed gastric pneumatosis, general surgery was consulted, octreotide  stopped (to avoid dysmotility), patient was unable to tolerate NG tube placement, made n.p.o. again/tube feeds held, gentle IVF continuing, PICC line placed and TPN started on 01/29/2024. -Pneumatosis has resolved.  Cortrack tube has been removed.  Patient is on TPN. - Input from GI surgical team is highly appreciated.  Patient is tolerating heart healthy diet intermittently, general surgery on board, patient prefers to go home with J-tube instead of TPN.  General surgery is going to place core track placement and discussed with IR about J-tube placement.     S/p mechanical aortic valve replacement and aortoplasty -INR still subtherapeutic.  Continue bridging Lovenox  and Coumadin  dosing per pharmacy.  Continue aspirin . -Follows with Dr. Wendel, Cardiology and the Coumadin  Clinic   Essential hypertension - Blood pressure controlled.  Continue Toprol -XL 12.5 mg p.o. nightly.     Alcohol use disorder, in remission Stable   Asthma Stable   Anemia of Chronic Disease Stable.   Small intestinal bacterial overgrowth (SIBO) Completed 14 days of rifaximin .  Resolved.   Nutrition Status: Nutrition Problem: Severe Malnutrition Calorie count to see progress.  Tolerating clear liquids. Continue TPN. TPN on discharge.. Body mass index is 20.57 kg/m.  DVT prophylaxis: Coumadin /Lovenox    Code Status:  Full Code  Family Communication:  None present at bedside.  Plan of care discussed with patient in length and he/she verbalized understanding and agreed with it.  Status is: Inpatient Remains inpatient appropriate because: General surgery doing calorie counts, decision has to be made for J-tube or TPN to go home.   Estimated body mass index is 20.63 kg/m as calculated from the following:   Height as of this encounter: 5' 11 (1.803 m).   Weight as of this encounter: 67.1 kg.    Nutritional Assessment: Body mass index is 20.63 kg/m.SABRA Seen by dietician.  I agree with the assessment and plan as outlined below: Nutrition Status: Nutrition Problem: Severe Malnutrition Etiology: vomiting, chronic illness, altered GI function Signs/Symptoms: moderate fat depletion, severe muscle depletion Interventions: Refer to RD note for recommendations  . Skin Assessment: I have examined the patient's skin and I agree with the wound assessment as performed by the wound care RN as outlined below:    Consultants:  GI and general surgery  Procedures:  As above  Antimicrobials:  Anti-infectives (From admission, onward)    Start     Dose/Rate Route Frequency Ordered Stop   01/18/24 1230  rifaximin  (XIFAXAN ) tablet 550 mg  Status:  Discontinued        550 mg Oral 3 times daily 01/18/24 1132 01/26/24 1323         Subjective: Seen and examined.  He says that he is doing much better as far as tolerating diet close.  Last time he had vomiting was 4 AM yesterday.  Since then he has been having only intermittent nausea but that is also improving.  No other complaint.  Objective: Vitals:   02/04/24 1656 02/04/24 2030 02/05/24 0411 02/05/24 0445  BP: 106/74 116/77  101/74  Pulse: 79 73  66  Resp:  18  15  Temp:  98.2 F (36.8 C)  97.6 F (36.4 C)  TempSrc:  Oral  Oral  SpO2: 100% 99%  100%  Weight:   67.1 kg   Height:        Intake/Output Summary (Last 24 hours) at 02/05/2024 0735 Last  data filed at 02/04/2024 2300 Gross per 24 hour  Intake 1777.26 ml  Output --  Net 1777.26 ml   Filed Weights   02/02/24 0435 02/04/24 0500 02/05/24 0411  Weight: 66.9 kg 65.9 kg 67.1 kg    Examination:  General exam: Appears calm and comfortable  Respiratory system: Clear to auscultation. Respiratory effort normal. Cardiovascular system: S1 & S2 heard, RRR. No JVD, murmurs, rubs, gallops or clicks. No pedal edema. Gastrointestinal system: Abdomen is nondistended, soft and nontender. No organomegaly or masses felt. Normal bowel sounds heard. Central nervous system: Alert and oriented. No focal neurological deficits. Extremities: Symmetric 5 x 5 power. Skin: No rashes, lesions or ulcers.  Psychiatry: Judgement and insight appear normal. Mood & affect appropriate.   Data Reviewed: I have personally reviewed following labs and imaging studies  CBC: Recent Labs  Lab 02/01/24 0244 02/02/24 0551 02/03/24 0500 02/04/24 0417 02/05/24 0332  WBC 4.3 5.1 6.3 7.3 6.1  HGB 10.6* 11.1* 10.7* 10.8* 10.0*  HCT 32.8* 33.8* 33.0* 33.0* 30.9*  MCV 88.2 87.3 87.5 87.1 88.5  PLT 284 282 308 287 270   Basic Metabolic Panel: Recent Labs  Lab 01/30/24 0500 01/31/24 0422 02/01/24 0244 02/02/24 0551 02/03/24 0500  02/04/24 0417  NA 140 141 137 138 136 135  K 3.7 3.8 4.3 4.2 4.0 4.0  CL 102 103 104 104 103 101  CO2 25 26 25 27 27 26   GLUCOSE 123* 139* 128* 144* 133* 146*  BUN 10 9 10 10 12 14   CREATININE 0.69 0.60* 0.55* 0.55* 0.54* 0.52*  CALCIUM  8.6* 8.8* 8.9 9.0 9.0 9.1  MG 1.9 2.0 2.0 2.1 2.1  --   PHOS 4.0 4.0 4.2 4.1 4.4  --    GFR: Estimated Creatinine Clearance: 124.6 mL/min (A) (by C-G formula based on SCr of 0.52 mg/dL (L)). Liver Function Tests: Recent Labs  Lab 01/30/24 0500 02/03/24 0500  AST 15 29  ALT 15 34  ALKPHOS 85 134*  BILITOT 0.6 0.2  PROT 6.3* 6.8  ALBUMIN  3.2* 3.2*   No results for input(s): LIPASE, AMYLASE in the last 168 hours. No results for  input(s): AMMONIA in the last 168 hours. Coagulation Profile: Recent Labs  Lab 02/01/24 0244 02/02/24 0551 02/03/24 0500 02/04/24 0417 02/05/24 0332  INR 1.2 1.3* 1.5* 1.5* 1.4*   Cardiac Enzymes: No results for input(s): CKTOTAL, CKMB, CKMBINDEX, TROPONINI in the last 168 hours. BNP (last 3 results) No results for input(s): PROBNP in the last 8760 hours. HbA1C: No results for input(s): HGBA1C in the last 72 hours. CBG: Recent Labs  Lab 02/04/24 1136 02/04/24 1654 02/04/24 2030 02/05/24 0023 02/05/24 0517  GLUCAP 148* 134* 140* 139* 132*   Lipid Profile: Recent Labs    02/03/24 0500  TRIG 179*   Thyroid Function Tests: No results for input(s): TSH, T4TOTAL, FREET4, T3FREE, THYROIDAB in the last 72 hours. Anemia Panel: No results for input(s): VITAMINB12, FOLATE, FERRITIN, TIBC, IRON, RETICCTPCT in the last 72 hours. Sepsis Labs: No results for input(s): PROCALCITON, LATICACIDVEN in the last 168 hours.  No results found for this or any previous visit (from the past 240 hours).   Radiology Studies: No results found.  Scheduled Meds:  aspirin  EC  81 mg Oral Daily   Chlorhexidine  Gluconate Cloth  6 each Topical Daily   feeding supplement  1 Container Oral TID WC   insulin  aspart  0-9 Units Subcutaneous Q6H   lipase/protease/amylase  72,000 Units Oral TID AC   metoprolol  succinate  12.5 mg Oral QHS   pantoprazole  (PROTONIX ) IV  40 mg Intravenous Q12H   polyethylene glycol  17 g Oral Daily   sodium chloride  flush  3 mL Intravenous Q12H   Warfarin - Pharmacist Dosing Inpatient   Does not apply q1600   Continuous Infusions:  TPN ADULT (ION) 90 mL/hr at 02/04/24 1751     LOS: 25 days   Fredia Skeeter, MD [facility]  02/05/2024, 7:35 AM   *Please note that this is a verbal dictation therefore any spelling or grammatical errors are due to the Dragon Medical One system interpretation.  Please page via Amion and  do not message via secure chat for urgent patient care matters. Secure chat can be used for non urgent patient care matters.  How to contact the [facility] Attending or Consulting provider 7A - 7P or covering provider during after hours 7P -7A, for this patient?  Check the care team in [facility] and look for a) attending/consulting [facility] provider listed and b) the [facility] team listed. Page or secure chat 7A-7P. Log into [email or web address] and use [place]'s universal password to access. If you do not have the password, please contact the hospital operator. Locate the [facility] provider you are  looking for under [facility] and page to a number that you can be directly reached. If you still have difficulty reaching the provider, please page the [facility] (Director on Call) for the Hospitalists listed on amion for assistance.

## 2024-02-05 NOTE — Progress Notes (Signed)
 Progress Note     Subjective: Patient reports improvement of epigastric pain. Has had some nausea. Denies vomiting. Reports bowel movement this morning without hematuria or mucus. Reports flatulence. Patient   ROS  All negative with the exception of above.  Objective: Vital signs in last 24 hours: Temp:  [97.5 F (36.4 C)-98.2 F (36.8 C)] 97.5 F (36.4 C) (08/27 0740) Pulse Rate:  [66-79] 72 (08/27 0740) Resp:  [15-18] 15 (08/27 0445) BP: (96-116)/(66-77) 96/66 (08/27 0740) SpO2:  [99 %-100 %] 100 % (08/27 0740) Weight:  [67.1 kg] 67.1 kg (08/27 0411) Last BM Date : 02/04/24  Intake/Output from previous day: 08/26 0701 - 08/27 0700 In: 1777.3 [I.V.:1777.3] Out: -  Intake/Output this shift: No intake/output data recorded.  PE: General: Pleasant male who is laying in bed in NAD. Lungs: Respiratory effort nonlabored Abd: Soft and ND. Mild tenderness to palpation of epigastric region. No rebound tenderness or guarding. Psych: A&Ox3 with an appropriate affect.   Lab Results:  Recent Labs    02/04/24 0417 02/05/24 0332  WBC 7.3 6.1  HGB 10.8* 10.0*  HCT 33.0* 30.9*  PLT 287 270   BMET Recent Labs    02/03/24 0500 02/04/24 0417  NA 136 135  K 4.0 4.0  CL 103 101  CO2 27 26  GLUCOSE 133* 146*  BUN 12 14  CREATININE 0.54* 0.52*  CALCIUM  9.0 9.1   PT/INR Recent Labs    02/04/24 0417 02/05/24 0332  LABPROT 18.6* 17.9*  INR 1.5* 1.4*   CMP     Component Value Date/Time   NA 135 02/04/2024 0417   NA 142 08/30/2021 1430   K 4.0 02/04/2024 0417   CL 101 02/04/2024 0417   CO2 26 02/04/2024 0417   GLUCOSE 146 (H) 02/04/2024 0417   BUN 14 02/04/2024 0417   BUN 13 08/30/2021 1430   CREATININE 0.52 (L) 02/04/2024 0417   CALCIUM  9.1 02/04/2024 0417   PROT 6.8 02/03/2024 0500   ALBUMIN  3.2 (L) 02/03/2024 0500   AST 29 02/03/2024 0500   ALT 34 02/03/2024 0500   ALKPHOS 134 (H) 02/03/2024 0500   BILITOT 0.2 02/03/2024 0500   GFRNONAA >60 02/04/2024  0417   Lipase     Component Value Date/Time   LIPASE 344 (H) 01/27/2024 0229       Studies/Results: No results found.  Anti-infectives: Anti-infectives (From admission, onward)    Start     Dose/Rate Route Frequency Ordered Stop   01/18/24 1230  rifaximin  (XIFAXAN ) tablet 550 mg  Status:  Discontinued        550 mg Oral 3 times daily 01/18/24 1132 01/26/24 1323        Assessment/Plan Acute on chronic pancreatitis Calcifications of head of the pancreas Pancreatic pseudocyst Gastric pneumatosis -Most recent imaging includes: CT abd/pel w contrast 8/21 showing no evidence of gastric pneumatosis. Stable multilocular cystic process anterior to the pancreatic head, consistent with pseudocysts.  -WBC 6.1. HGB 10.0 -Afebrile. Vitals stable. -Calorie count ongoing. Will continue to follow recordings. RD recommending PEJ tube placement and post-pyloric EN at home. - PP Cortrak ordered for today -Will plan to discuss case with IR for possible GJ placement vs open J-tube placement.  -No acute surgical intervention recommended at this time. Should patient be discharged home, outpatient follow up has been arranged. -GI continuing to follow peripherally. Patient to have CT imaging prior to ERCP that is scheduled for outpatient.   FEN: Cortrak ordered for today; TPN; Feeding supplement (BOOST);  Heart diet; IVF per primary team VTE: Warfarin ID: None currently    LOS: 25 days   I reviewed specialist notes, hospitalist notes, last 24 h vitals and pain scores, last 48 h intake and output, last 24 h labs and trends, and last 24 h imaging results.  This care required moderate level of medical decision making.    Marjorie Carlyon Favre, [facility] 02/05/2024, 8:28 AM Please see Amion for pager number during day hours 7:00am-4:30pm

## 2024-02-05 NOTE — Progress Notes (Signed)
 PHARMACY - TOTAL PARENTERAL NUTRITION CONSULT NOTE   Indication: severe pancreatitis and anticipated prolong NPO status  Patient Measurements: Height: 5' 11 (180.3 cm) Weight: 67.1 kg (147 lb 14.9 oz) IBW/kg (Calculated) : 75.3 TPN AdjBW (KG): 71 Body mass index is 20.63 kg/m. Usual Weight: 66 kg  Assessment:  33 y.o. year old male with a past medical history significant for chronic pancreatitis, history of aortic valve replacement requiring chronic anticoagulation, SIBO s/p course of rifaximin . Surgery has recommended postpyloric feedings be held due to the presence of pneumatosis. Mansouraty who recommends against any endoscopic intervention at this time due to gastric pneumatosis and immature wall of the pancreas fluid collection. PEJ to be considered when enteral feeding can be safely resumed. Pharmacy consulted to initiate TPN in the interim.  Per patient, had mild flushing and headache overnight, resolved without needing interventions. No issues with nausea/vomiting. No BM since 8/15 per charting and confirmation by patient. Still good UOP per patient.   Glucose / Insulin : no hx DM, CBGs < 160; 4 units insulin  given in prior 24 hours Electrolytes: Na 135 K 4.0, Cl 101, CoCa 9.74, Phos 4.4, Mg 2.1 Renal: Scr < 1, BUN 14 Hepatic: AST/ALT wnl, alb 3.2, Tbili 0.2, TG 179 [goal <150] Intake / Output:  LBM 8/26, UOP not measured  GI Imaging: 8/02 CT: severe sequelae of advanced pancreatitis 8/14 MRCP: acute pancreatitis, severe pancreatic ductal dilatation, concerning for ulceration to the bowel lumen 8/18 CT: gas density between the intraluminal gastric fluid and the wall of the stomach in the posterior and lateral portions of the fundus, concerning for pneumatosis 8/21 CT abd shows no evidence of gastric pneumatosis, stable multilocular cystic process anterior to the pancreatic head  GI Surgeries / Procedures:  None since starting TPN  Central access: 01/29/24 TPN start date:  01/29/24  Nutritional Goals: Goal TPN rate is 90 mL/hr (provides 130 g of protein and 2100 kcals per day)  RD Assessment: Estimated Needs Total Energy Estimated Needs: 2000-2350 kcal Total Protein Estimated Needs: 105-140 g Total Fluid Estimated Needs: >/=2L  Current Nutrition:  8/20 NPO, TPN 8/22 TPN, CLD 8/23 TPN, FLD  Plan:  Continue TPN to 90 mL/hr at 1800 to provide 100% nutritional needs Reduced lipids and increased protein to compensate d/t elevation in triglycerides [02/03/24 >>] Electrolytes in TPN: Na 21mEq/L, K 62mEq/L, Ca 44mEq/L, Mg 35mEq/L, and Phos 15mmol/L. Cl:Ac 1:1 Add standard MVI and trace elements to TPN Continue Sensitive q6h SSI and adjust as needed  Monitor TPN labs on Mon/Thurs, daily PRN   Thank you for allowing pharmacy to be a part of this patient's care.   Benedetta Heath BS, PharmD, BCPS Clinical Pharmacist 02/05/2024 7:06 AM  Contact: [phone] after 3 PM

## 2024-02-05 NOTE — Progress Notes (Addendum)
 Nutrition Short Note  -Continue to strongly recommend PEJ tube placement and post-pyloric EN at home to provide adequate kcal/pro intake with inability to meet needs orally as well as previous tolerance of post-pyloric enteral nutrition during this admission.   -Per MD, pt had Cortrak placed today. It was unable to be advanced post-pyloric, will need fluoroscopic advancement.   -Plan to get PEJ or PEG/J, transition to EN and d/c home with EN in place.    -Continue TPN to 90 mL/hr at 1800 to provide 100% nutritional needs (provides 130 g of protein and 2100 kcals per day) until EN is able to be initiated and advanced to meet 50% of pt's estimated needs.      ASSESSMENT:    Hx hypertension, bicuspid aortic valve with severe aortic insufficiency and moderate aortic stenosis s/p mechanical aortic valve replacement in arthroplasty on 09/27/2021 on chronic anticoagulation, recurrent pancreatitis, asthma, GERD, and prior history of alcohol abuse presents with abdominal pain.   Spoke to pt at bedside. Later spoke to pt's mother while Cortrak being placed. Pt continues on HH diet, though, has continued to not tolerate any solids foods and is also having difficulty tolerating liquids. Continue to strongly recommend PEJ tube placement and post-pyloric EN as most appropriate nutrition support intervention.  Pt was tolerating [place] enteral nutrition prior to NPO status from GI; Enteral nutrition remains most appropriate v home TPN. Pt is very active at home, has active job and has had significant weight loss and severe protein calorie malnutrition. He will need even more kcal/pro when he resumes home activity levels. Pt's mother worried about his nutritional intake, body weight. Reassured mother that TPN and then EN is/will meet all of pt's kcal and pro needs. She is concerned about him getting all the nutrition he needs when he gets home and becomes more active again. Discussed estimated needs take activity levels  into account, will be followed by an RD and EN will be adjusted prn.    Calorie count initiated by MD Sunday. Pt continues to not tolerate solid foods, strongly suspect pt will not meet nutritional needs with liquids alone, necessitating nutrition support. No saved meal tickets since yesterday's assessment, no documented meal intake. Pt has had small amounts of liquids.   TPN running at goal, adjusted prn by pharmacy.   Unfortunately, pt's Cortrak was unable to be placed post pyloric, will need advancement. IDT discussion ongoing regarding plan.   Luiscarlos Kaczmarczyk Daml-Budig, RDN, LDN Registered Dietitian Nutritionist RD Inpatient Contact Info in Amion    Addendum: Case Management requesting d/c enteral nutrition plan.   Ultimate goal of: Osmolite 1.5 continuous @ goal rate 69 ml/hr via J tube x 24hrs At goal this provides 2489 kcal, 104 g pro, 1264 ml  Free water  flushes of 25 ml q-1 for a total of 1864 ml fluid  This equals exactly 7 cartons of Osmolite 1.5 daily (237 ml/carton)  Home estimated nutritional needs with increased activity and goal of desirable gain toward UBW  2200-2600 kcal (MSJ 1.3-1.6) 80-100 g protein (1.2-1.5 g/kg ABW) >/=2L fluid daily  Allice Garro Daml-Budig, RDN, LDN Registered Dietitian Nutritionist RD Inpatient Contact Info in [facility]

## 2024-02-05 NOTE — TOC Progression Note (Signed)
 Transition of Care [facility]) - Progression Note    Patient Details  Name: [name] [name] MRN: [id number] Date of Birth: 07-22-1990  [facility] of [facility]) CM/SW Contact  [name] JONELLE Joe, RN Phone Number: 02/05/2024, 4:27 PM  Clinical Narrative:    CM updated Holley herring, RNCM with [employer] that surgical team requested Cortrak placement this morning and will monitor patient's tolerance of the feeds before proceeding with possible G/J-tube placement.  Surgery team, GI MD and hospitalist are aware that patient is unable to discharge home with a Cortrak in place.  CM with IP Care management team will continue to follow the patient as patient progresses.                     Expected Discharge Plan and Services                                               Social Drivers of Health (SDOH) Interventions SDOH Screenings   Food Insecurity: No Food Insecurity (01/11/2024)  Housing: Low Risk  (01/11/2024)  Transportation Needs: No Transportation Needs (01/11/2024)  Utilities: Not At Risk (01/11/2024)  Depression (PHQ2-9): Medium Risk (09/09/2023)  Financial Resource Strain: Low Risk  (04/25/2023)  Physical Activity: Sufficiently Active (04/25/2023)  Social Connections: Unknown (04/25/2023)  Stress: No Stress Concern Present (04/25/2023)  Tobacco Use: Low Risk  (01/20/2024)    Readmission Risk Interventions    01/29/2024   12:53 PM 01/15/2024   11:03 AM  Readmission Risk Prevention Plan  Post Dischage Appt  Complete  Medication Screening  Complete  Transportation Screening Complete Complete  PCP or Specialist Appt within 5-7 Days Complete   Home Care Screening Complete   Medication Review (RN CM) Complete

## 2024-02-06 ENCOUNTER — Inpatient Hospital Stay (HOSPITAL_COMMUNITY)

## 2024-02-06 DIAGNOSIS — K861 Other chronic pancreatitis: Secondary | ICD-10-CM | POA: Diagnosis not present

## 2024-02-06 DIAGNOSIS — K859 Acute pancreatitis without necrosis or infection, unspecified: Secondary | ICD-10-CM | POA: Diagnosis not present

## 2024-02-06 LAB — COMPREHENSIVE METABOLIC PANEL WITH GFR
ALT: 27 U/L (ref 0–44)
AST: 22 U/L (ref 15–41)
Albumin: 3.2 g/dL — ABNORMAL LOW (ref 3.5–5.0)
Alkaline Phosphatase: 103 U/L (ref 38–126)
Anion gap: 9 (ref 5–15)
BUN: 18 mg/dL (ref 6–20)
CO2: 26 mmol/L (ref 22–32)
Calcium: 9 mg/dL (ref 8.9–10.3)
Chloride: 103 mmol/L (ref 98–111)
Creatinine, Ser: 0.53 mg/dL — ABNORMAL LOW (ref 0.61–1.24)
GFR, Estimated: >60 mL/min (ref >60)
Glucose, Bld: 135 mg/dL — ABNORMAL HIGH (ref 70–99)
Potassium: 4 mmol/L (ref 3.5–5.1)
Sodium: 138 mmol/L (ref 135–145)
Total Bilirubin: 0.3 mg/dL (ref 0.0–1.2)
Total Protein: 6.5 g/dL (ref 6.5–8.1)

## 2024-02-06 LAB — GLUCOSE, CAPILLARY
Glucose-Capillary: 142 mg/dL — ABNORMAL HIGH (ref 70–99)
Glucose-Capillary: 122 mg/dL — ABNORMAL HIGH (ref 70–99)
Glucose-Capillary: 162 mg/dL — ABNORMAL HIGH (ref 70–99)
Glucose-Capillary: 133 mg/dL — ABNORMAL HIGH (ref 70–99)

## 2024-02-06 LAB — CBC
HCT: 30.3 % — ABNORMAL LOW (ref 39.0–52.0)
Hemoglobin: 10 g/dL — ABNORMAL LOW (ref 13.0–17.0)
MCH: 28.8 pg (ref 26.0–34.0)
MCHC: 33 g/dL (ref 30.0–36.0)
MCV: 87.3 fL (ref 80.0–100.0)
Platelets: 300 K/uL (ref 150–400)
RBC: 3.47 MIL/uL — ABNORMAL LOW (ref 4.22–5.81)
RDW: 13.4 % (ref 11.5–15.5)
WBC: 5.7 K/uL (ref 4.0–10.5)
nRBC: 0 % (ref 0.0–0.2)

## 2024-02-06 LAB — PROTIME-INR
INR: 1.5 — ABNORMAL HIGH (ref 0.8–1.2)
Prothrombin Time: 18.7 s — ABNORMAL HIGH (ref 11.4–15.2)

## 2024-02-06 LAB — PHOSPHORUS: Phosphorus: 4.4 mg/dL (ref 2.5–4.6)

## 2024-02-06 LAB — MAGNESIUM: Magnesium: 1.8 mg/dL (ref 1.7–2.4)

## 2024-02-06 MED ORDER — METHYLNALTREXONE BROMIDE 12 MG/0.6ML ~~LOC~~ SOLN
12.0000 mg | Freq: Once | SUBCUTANEOUS | Status: DC
  Filled 2024-02-06: qty 0.6

## 2024-02-06 MED ORDER — OSMOLITE 1.2 CAL PO LIQD: 1000.0000 mL | ORAL | Status: DC

## 2024-02-06 MED ORDER — WARFARIN SODIUM 6 MG PO TABS
6.0000 mg | ORAL_TABLET | Freq: Once | ORAL | Status: AC
  Administered 2024-02-06: 6 mg via ORAL
  Filled 2024-02-06: qty 1

## 2024-02-06 MED ORDER — OSMOLITE 1.5 CAL PO LIQD
1000.0000 mL | ORAL | Status: DC
  Administered 2024-02-06 – 2024-02-12 (×2): 1000 mL
  Filled 2024-02-06 (×15): qty 1000

## 2024-02-06 MED ORDER — BISACODYL 10 MG RE SUPP: 10.0000 mg | Freq: Once | RECTAL | Status: DC

## 2024-02-06 MED ORDER — BISACODYL 5 MG PO TBEC: 5.0000 mg | DELAYED_RELEASE_TABLET | Freq: Every day | ORAL | Status: DC | PRN

## 2024-02-06 MED ORDER — IOHEXOL 300 MG/ML  SOLN
20.0000 mL | Freq: Once | INTRAMUSCULAR | Status: AC | PRN
  Administered 2024-02-06: 20 mL

## 2024-02-06 MED ORDER — POLYETHYLENE GLYCOL 3350 17 G PO PACK
17.0000 g | PACK | Freq: Three times a day (TID) | ORAL | Status: DC
  Administered 2024-02-06 – 2024-02-12 (×8): 17 g via ORAL
  Filled 2024-02-06 (×11): qty 1

## 2024-02-06 MED ORDER — TRAVASOL 10 % IV SOLN
INTRAVENOUS | Status: DC
  Filled 2024-02-06 (×2): qty 1296

## 2024-02-06 MED ORDER — LIDOCAINE VISCOUS HCL 2 % MT SOLN
3.0000 mL | Freq: Once | OROMUCOSAL | Status: AC
  Administered 2024-02-06: 3 mL via OROMUCOSAL

## 2024-02-06 MED ORDER — MAGNESIUM HYDROXIDE 400 MG/5ML PO SUSP
30.0000 mL | Freq: Once | ORAL | Status: DC
  Filled 2024-02-06: qty 30

## 2024-02-06 MED ORDER — SENNA 8.6 MG PO TABS: 2.0000 | ORAL_TABLET | Freq: Once | ORAL | Status: DC

## 2024-02-06 MED ORDER — FREE WATER
25.0000 mL | Status: DC
  Administered 2024-02-06 – 2024-02-14 (×171): 25 mL

## 2024-02-06 MED ORDER — MAGNESIUM CITRATE PO SOLN
1.0000 | Freq: Once | ORAL | Status: DC
  Filled 2024-02-06: qty 296

## 2024-02-06 NOTE — Progress Notes (Signed)
 PHARMACY - TOTAL PARENTERAL NUTRITION CONSULT NOTE   Indication: severe pancreatitis and anticipated prolong NPO status  Patient Measurements: Height: 5' 11 (180.3 cm) Weight: 66.1 kg (145 lb 11.6 oz) IBW/kg (Calculated) : 75.3 TPN AdjBW (KG): 71 Body mass index is 20.32 kg/m. Usual Weight: 66 kg  Assessment:  33 y.o. year old male with a past medical history significant for chronic pancreatitis, history of aortic valve replacement requiring chronic anticoagulation, SIBO s/p course of rifaximin . Surgery has recommended postpyloric feedings be held due to the presence of pneumatosis. Mansouraty who recommends against any endoscopic intervention at this time due to gastric pneumatosis and immature wall of the pancreas fluid collection. PEJ to be considered when enteral feeding can be safely resumed. Pharmacy consulted to initiate TPN in the interim.  Per patient, had mild flushing and headache overnight, resolved without needing interventions. No issues with nausea/vomiting. No BM since 8/15 per charting and confirmation by patient. Still good UOP per patient.   Glucose / Insulin : no hx DM, CBGs < 160; 4 units insulin  given in prior 24 hours Electrolytes: Na 138, K 4.0, Cl 103, CoCa 9.7, Phos 4.4, Mg 1.8 Renal: Scr < 1, BUN 14 Hepatic: AST/ALT wnl, alb 3.2, Tbili 0.20.3 TG 179 [goal <150] Intake / Output:  LBM 8/27, UOP not measured  GI Imaging: 8/02 CT: severe sequelae of advanced pancreatitis 8/14 MRCP: acute pancreatitis, severe pancreatic ductal dilatation, concerning for ulceration to the bowel lumen 8/18 CT: gas density between the intraluminal gastric fluid and the wall of the stomach in the posterior and lateral portions of the fundus, concerning for pneumatosis 8/21 CT abd shows no evidence of gastric pneumatosis, stable multilocular cystic process anterior to the pancreatic head  GI Surgeries / Procedures:  8/27 Cortak placed (not usable yet)  Central access: 01/29/24 TPN  start date: 01/29/24  Nutritional Goals: Goal TPN rate is 90 mL/hr (provides 130 g of protein and 2100 kcals per day)  RD Assessment: Estimated Needs Total Energy Estimated Needs: 2000-2350 kcal Total Protein Estimated Needs: 105-140 g Total Fluid Estimated Needs: >/=2L  Current Nutrition:  8/20 NPO, TPN 8/22 TPN, CLD 8/23 TPN, FLD  Plan:  Continue TPN t 90 mL/hr to provide 100% nutritional needs - Reduced lipids and increased protein to compensate d/t elevation in triglycerides [02/03/24 >>] Electrolytes in TPN: Na 34mEq/L, K 53mEq/L, Ca 15mEq/L, Mg 54mEq/L, and Phos 15mmol/L. Cl:Ac 1:1 Add standard MVI and trace elements to TPN Continue Sensitive q6h SSI and adjust as needed  Monitor TPN labs on Mon/Thurs, daily PRN F/u cycling tomorrow if able   Thank you for allowing pharmacy to be a part of this patient's care.  Shelba Collier, PharmD, BCPS Clinical Pharmacist

## 2024-02-06 NOTE — Progress Notes (Signed)
 Progress Note     Subjective: Patient reports some pain after having tube placement this morning. Feels this is manageable. Reports some intake of mostly liquids without vomiting. Reports some nausea. Had a bowel movement yesterday. Reports flatulence.  ROS  All negative with the exception of above.  Objective: Vital signs in last 24 hours: Temp:  [97.6 F (36.4 C)-98 F (36.7 C)] 97.7 F (36.5 C) (08/28 0820) Pulse Rate:  [64-73] 72 (08/28 0820) Resp:  [17-20] 17 (08/28 0820) BP: (92-117)/(66-85) 117/72 (08/28 0820) SpO2:  [98 %-100 %] 100 % (08/28 0820) Weight:  [66.1 kg] 66.1 kg (08/28 0500) Last BM Date : 02/04/24  Intake/Output from previous day: 08/27 0701 - 08/28 0700 In: 1124.5 [I.V.:1124.5] Out: -  Intake/Output this shift: Total I/O In: 3 [I.V.:3] Out: -   PE: General: Pleasant male who is laying in bed in NAD. Lungs: Respiratory effort nonlabored Abd: Soft and ND. Mild tenderness to palpation of epigastric region. No rebound tenderness or guarding. Psych: A&Ox3 with an appropriate affect.   Lab Results:  Recent Labs    02/05/24 0332 02/06/24 0500  WBC 6.1 5.7  HGB 10.0* 10.0*  HCT 30.9* 30.3*  PLT 270 300   BMET Recent Labs    02/04/24 0417 02/06/24 0500  NA 135 138  K 4.0 4.0  CL 101 103  CO2 26 26  GLUCOSE 146* 135*  BUN 14 18  CREATININE 0.52* 0.53*  CALCIUM  9.1 9.0   PT/INR Recent Labs    02/05/24 0332 02/06/24 0500  LABPROT 17.9* 18.7*  INR 1.4* 1.5*   CMP     Component Value Date/Time   NA 138 02/06/2024 0500   NA 142 08/30/2021 1430   K 4.0 02/06/2024 0500   CL 103 02/06/2024 0500   CO2 26 02/06/2024 0500   GLUCOSE 135 (H) 02/06/2024 0500   BUN 18 02/06/2024 0500   BUN 13 08/30/2021 1430   CREATININE 0.53 (L) 02/06/2024 0500   CALCIUM  9.0 02/06/2024 0500   PROT 6.5 02/06/2024 0500   ALBUMIN  3.2 (L) 02/06/2024 0500   AST 22 02/06/2024 0500   ALT 27 02/06/2024 0500   ALKPHOS 103 02/06/2024 0500   BILITOT 0.3  02/06/2024 0500   GFRNONAA >60 02/06/2024 0500   Lipase     Component Value Date/Time   LIPASE 344 (H) 01/27/2024 0229       Studies/Results: DG Abd 1 View Result Date: 02/06/2024 CLINICAL DATA:  [id number] Encounter for feeding tube placement [id number]. EXAM: ABDOMEN - 1 VIEW COMPARISON:  01/27/2024. FINDINGS: The bowel gas pattern is non-obstructive. No evidence of pneumoperitoneum, within the limitations of a supine film. No acute osseous abnormalities. The soft tissues are within normal limits. Surgical changes, devices, tubes and lines: There is retrograde opacification of the proximal small bowel loop, likely proximal duodenum with positive contrast. There is no opacification of stomach. No frank extravasation of contrast outside the presumed confines of the bowel, to suggest frank leak. IMPRESSION: Nonobstructive bowel gas pattern. There is retrograde opacification of the proximal small bowel loop, likely proximal duodenum with positive contrast. There is no opacification of stomach. No frank extravasation of contrast outside the presumed confines of the bowel, to suggest frank leak. Electronically Signed   By: Ree Molt M.D.   On: 02/06/2024 08:07    Anti-infectives: Anti-infectives (From admission, onward)    Start     Dose/Rate Route Frequency Ordered Stop   01/18/24 1230  rifaximin  (XIFAXAN ) tablet 550 mg  Status:  Discontinued        550 mg Oral 3 times daily 01/18/24 1132 01/26/24 1323        Assessment/Plan Acute on chronic pancreatitis Calcifications of head of the pancreas Pancreatic pseudocyst Gastric pneumatosis -Most recent imaging includes: CT abd/pel w contrast 8/21 showing no evidence of gastric pneumatosis. Stable multilocular cystic process anterior to the pancreatic head, consistent with pseudocysts.  -WBC 5.7. HGB 10.0 -Afebrile. Vitals stable. -PP Cortrak placed 8/27-8/28. -Will follow his tolerance and communicate further with IR for possible  percutaneous options. -No acute surgical intervention recommended at this time. -GI continuing to follow peripherally. Patient to have CT imaging prior to ERCP that is scheduled for outpatient.   FEN: TFs; TPN previously; Feeding supplement (BOOST); Heart diet; IVF per primary team VTE: Warfarin ID: None currently    LOS: 26 days   I reviewed specialist notes, hospitalist notes, last 24 h vitals and pain scores, last 48 h intake and output, last 24 h labs and trends, and last 24 h imaging results.  This care required moderate level of medical decision making.    Marjorie Carlyon Favre, [facility] 02/06/2024, 8:43 AM Please see [facility] for pager number during day hours 7:00am-4:30pm

## 2024-02-06 NOTE — TOC Progression Note (Signed)
 Transition of Care [facility]) - Progression Note    Patient Details  Name: [name] [name] MRN: [id number] Date of Birth: November 26, 1990  [facility] CM/SW Contact  [name] JONELLE Joe, RN Phone Number: 02/06/2024, 11:46 AM  Clinical Narrative:    CM with IP Care management team noted per MD that GI team is assisting in patient's management. Calorie count is in process.  Patient does not want to go home with TPN - Patient has right arm PICC line in place that he will not go home with, he would rather have J-tube placed before discharge. General surgery is following, calorie count in progress. Plan of J-tube based on the calorie count - pending placement at a later date - surgery team is following the patient.  Post Pyloric Cortrak is in place today.  Pam chandler, RNCM with [facility] will continue to follow for enteral needs for home once patient is closer to discharge and J-tube has been placed by surgery at a later date.                     Expected Discharge Plan and Services                                               Social Drivers of Health (SDOH) Interventions SDOH Screenings   Food Insecurity: No Food Insecurity (01/11/2024)  Housing: Low Risk  (01/11/2024)  Transportation Needs: No Transportation Needs (01/11/2024)  Utilities: Not At Risk (01/11/2024)  Depression (PHQ2-9): Medium Risk (09/09/2023)  Financial Resource Strain: Low Risk  (04/25/2023)  Physical Activity: Sufficiently Active (04/25/2023)  Social Connections: Unknown (04/25/2023)  Stress: No Stress Concern Present (04/25/2023)  Tobacco Use: Low Risk  (01/20/2024)    Readmission Risk Interventions    01/29/2024   12:53 PM 01/15/2024   11:03 AM  Readmission Risk Prevention Plan  Post Dischage Appt  Complete  Medication Screening  Complete  Transportation Screening Complete Complete  PCP or Specialist Appt within 5-7 Days Complete   Home Care Screening Complete    Medication Review (RN CM) Complete

## 2024-02-06 NOTE — Progress Notes (Signed)
 Nutrition Follow-up  DOCUMENTATION CODES:   Severe malnutrition in context of chronic illness  INTERVENTION:  -Osmolite 1.5 continuous @ 20 ml/hr to be advanced as tolerated 10 ml q-6 to a goal rate of 70 ml/hr via Cortrak. FWF 25 ml q-1 At goal this provides 2520 kcal, 105 g pro, 1277 ml  Fluid total of 1877 ml fluid  -Titrate TPN per pharmacy recommendations  -Daily weights  -Continue to strongly recommend PEJ or PEG/J tube placement and post-pyloric EN as most appropriate nutrition support intervention    NUTRITION DIAGNOSIS:   Severe Malnutrition related to vomiting, chronic illness, altered GI function as evidenced by moderate fat depletion, severe muscle depletion.  Ongoing  GOAL:   Patient will meet greater than or equal to 90% of their needs  Met with TPN  MONITOR:   PO intake, Weight trends, TF tolerance, Supplement acceptance, Diet advancement, Labs, Skin  REASON FOR ASSESSMENT:   New TF, Diagnosis    ASSESSMENT:   Hx hypertension, bicuspid aortic valve with severe aortic insufficiency and moderate aortic stenosis s/p mechanical aortic valve replacement in arthroplasty on 09/27/2021 on chronic anticoagulation, recurrent pancreatitis, asthma, GERD, and prior history of alcohol abuse presents with abdominal pain.  Spoke to pt at bedside yesterday. Later spoke to pt's mother while Cortrak being placed yesterday. Pt continues on HH diet, though, has continued to not tolerate any solids foods and is also having difficulty tolerating liquids. Continue to strongly recommend PEJ tube placement and post-pyloric EN as most appropriate nutrition support intervention.  Pt was tolerating [place] enteral nutrition prior to NPO status from GI; Enteral nutrition remains most appropriate v home TPN.   Adjust pt's kcal, pro needs taking into account home activity levels. Pt's cortrak was successfully advanced post pyloric. Initiate Osmolite 1.5 continuous @ 20 ml/hr to be advanced as  tolerated 10 ml q-6 to a goal rate of 70 ml/hr via Cortrak.  FWF 25 ml q-1 At goal this provides 2520 kcal, 105 g pro, 1277 ml  Free water  flushes of 25 ml q-1 for a total of 1877 ml fluid  Labs BG 122-142 Cr 0.53 Albumin  3.2 TG 179 H/H 10/30.3  Medications  aspirin  EC  81 mg Oral Daily   bisacodyl   10 mg Rectal Once   Chlorhexidine  Gluconate Cloth  6 each Topical Daily   free water   25 mL Per Tube Q1H   insulin  aspart  0-9 Units Subcutaneous Q6H   lipase/protease/amylase  72,000 Units Oral TID AC   magnesium  citrate  1 Bottle Oral Once   magnesium  hydroxide  30 mL Oral Once   methylnaltrexone   12 mg Subcutaneous Once   metoprolol  succinate  12.5 mg Oral QHS   pantoprazole  (PROTONIX ) IV  40 mg Intravenous Q12H   polyethylene glycol  17 g Oral TID   senna  2 tablet Oral Once   sodium chloride  flush  3 mL Intravenous Q12H   warfarin  6 mg Oral ONCE-1600   Warfarin - Pharmacist Dosing Inpatient   Does not apply q1600      NUTRITION - FOCUSED PHYSICAL EXAM:  Flowsheet Row Most Recent Value  Orbital Region Moderate depletion  Upper Arm Region Moderate depletion  Thoracic and Lumbar Region Moderate depletion  Buccal Region Moderate depletion  Temple Region Moderate depletion  Clavicle Bone Region Severe depletion  Clavicle and Acromion Bone Region Severe depletion  Scapular Bone Region Moderate depletion  Dorsal Hand Moderate depletion  Patellar Region Moderate depletion  Anterior Thigh Region Moderate depletion  Posterior Calf Region Moderate depletion  Edema (RD Assessment) None  Hair Reviewed  Eyes Reviewed  Mouth Reviewed  Skin Reviewed  Nails Reviewed    Diet Order:   Diet Order             Diet Heart Fluid consistency: Thin  Diet effective now                   EDUCATION NEEDS:   Education needs have been addressed  Skin:  Skin Assessment: Reviewed RN Assessment  Last BM:  8/26  Height:   Ht Readings from Last 1 Encounters:  01/11/24 5'  11 (1.803 m)    Weight:   Wt Readings from Last 1 Encounters:  02/06/24 66.1 kg   BMI:  Body mass index is 20.32 kg/m.  Estimated Nutritional Needs:   Kcal:  2200-2600 kcal  Protein:  80-100 g  Fluid:  >/=2L  Winner Valeriano Daml-Budig, RDN, LDN Registered Dietitian Nutritionist RD Inpatient Contact Info in [place]

## 2024-02-06 NOTE — Progress Notes (Signed)
 PROGRESS NOTE    [name] [name]  FMW:[id number] DOB: 10/30/1990 DOA: 01/11/2024 PCP: Lucius Krabbe, NP   Brief Narrative:  Patient is a 33 year old male with past medical history significant for hypertension, s/p mechanical aortic valve replacement 09/27/21 on chronic warfarin anticoagulation, recurrent pancreatitis with multiple hospitalizations, asthma, GERD, prior history of alcohol use disorder (abstinent for 10 months PTA).  Patient presented to the hospital on 01/11/2024 with complaints of abdominal pain.  Workup revealed and had episode of pancreatitis, with pseudocyst and acute kidney injury.  CT A/P done on 8/18 revealed gastric pneumatosis, general surgery was consulted, octreotide  stopped.  Patient was unable to tolerate NG tube placement, made n.p.o. again, PICC line was placed and patient was started on TPN on 8/20.  CT abdomen pelvis on 01/30/2024 revealed resolution of the pneumatosis.  General surgery team has okayed dietary advancement.  GI team is assisting in patient's management.  Calorie count is in process, as patient has lost significant amount of weight.  Patient is felt to be malnourished.  Patient does not want to go home with TPN, he would rather have J-tube placed before discharge.  General surgery is following, calorie count in progress.  Plan of J-tube based on the calorie count.    Assessment & Plan:   Principal Problem:   Acute on chronic pancreatitis [facility]) Active Problems:   Pseudocyst of pancreas   S/P AVR (aortic valve replacement) and aortoplasty   Essential hypertension   On pre-exposure prophylaxis for HIV   History of asthma   History of alcohol abuse   Protein-calorie malnutrition, severe   Intraabdominal fluid collection  Acute on chronic pancreatitis, recurrent pancreatitis Acute fluid collection/pseudocyst Gastric pneumatosis -As per chart review, EUS by Dr. Wilhelmenia in 07/2023 that showed cystic lesion at the head of the pancreas  consistent with a pseudocyst and a 5 mm stone within the pancreatic duct at the junction of the head/neck.   -Cytology of the cyst was benign.  He was scheduled for an ERCP with attempted stone extraction in September with Dr. Wilhelmenia but now admitted with abdominal pain. -[place] [facility] was consulted, patient was treated supportively with n.p.o., IVF, core track with postpyloric feeding (initially started on 8/6).  Core track was dislodged on 8/18, briefly treated with liquid diet and core track was replaced.   -CT A/P on 8/18 showed gastric pneumatosis, general surgery was consulted, octreotide  stopped (to avoid dysmotility), patient was unable to tolerate NG tube placement, made n.p.o. again/tube feeds held, gentle IVF continuing, PICC line placed and TPN started on 01/29/2024. -Pneumatosis has resolved.  Cortrack tube has been removed.  Patient is on TPN. - Input from GI surgical team is highly appreciated.  Patient is tolerating heart healthy diet intermittently, general surgery on board, patient prefers to go home with J-tube instead of TPN.  General surgery discussed with IR and they recommended placing postpyloric tube and then considered him for percutaneous GJ or J-tube.  Patient had his postpyloric tube placed by IR today.  Awaiting further plans by general surgery.   S/p mechanical aortic valve replacement and aortoplasty -INR still subtherapeutic.  Continue bridging Lovenox  and Coumadin  dosing per pharmacy.  Continue aspirin . -Follows with Dr. Wendel, Cardiology and the Coumadin  Clinic   Essential hypertension - Blood pressure controlled.  Continue Toprol -XL 12.5 mg p.o. nightly.     Alcohol use disorder, in remission Stable   Asthma Stable   Anemia of Chronic Disease Stable.   Small intestinal bacterial overgrowth (SIBO) Completed  14 days of rifaximin .  Resolved.   Nutrition Status: Nutrition Problem: Severe Malnutrition Calorie count to see progress.   Tolerating clear  liquids. Continue TPN. TPN on discharge.. Body mass index is 20.57 kg/m.  DVT prophylaxis: Coumadin /Lovenox    Code Status: Full Code  Family Communication:  None present at bedside.  Plan of care discussed with patient in length and he/she verbalized understanding and agreed with it.  Status is: Inpatient Remains inpatient appropriate because: Awaiting further plans by general surgery.   Estimated body mass index is 20.32 kg/m as calculated from the following:   Height as of this encounter: 5' 11 (1.803 m).   Weight as of this encounter: 66.1 kg.    Nutritional Assessment: Body mass index is 20.32 kg/m.SABRA Seen by dietician.  I agree with the assessment and plan as outlined below: Nutrition Status: Nutrition Problem: Severe Malnutrition Etiology: vomiting, chronic illness, altered GI function Signs/Symptoms: moderate fat depletion, severe muscle depletion Interventions: Refer to RD note for recommendations  . Skin Assessment: I have examined the patient's skin and I agree with the wound assessment as performed by the wound care RN as outlined below:    Consultants:  GI and general surgery  Procedures:  As above  Antimicrobials:  Anti-infectives (From admission, onward)    Start     Dose/Rate Route Frequency Ordered Stop   01/18/24 1230  rifaximin  (XIFAXAN ) tablet 550 mg  Status:  Discontinued        550 mg Oral 3 times daily 01/18/24 1132 01/26/24 1323         Subjective: Patient seen and examined.  Complains of very mild epigastric pain.  Mild nausea.  No other complaint.  Objective: Vitals:   02/05/24 2350 02/06/24 0500 02/06/24 0503 02/06/24 0820  BP: 109/85  111/80 117/72  Pulse: 72  64 72  Resp: 20  20 17   Temp: 97.7 F (36.5 C)  97.6 F (36.4 C) 97.7 F (36.5 C)  TempSrc:      SpO2: 100%  99% 100%  Weight:  66.1 kg    Height:        Intake/Output Summary (Last 24 hours) at 02/06/2024 1109 Last data filed at 02/06/2024 0828 Gross per 24 hour   Intake 1127.5 ml  Output --  Net 1127.5 ml   Filed Weights   02/04/24 0500 02/05/24 0411 02/06/24 0500  Weight: 65.9 kg 67.1 kg 66.1 kg    Examination:  General exam: Appears calm and comfortable  Respiratory system: Clear to auscultation. Respiratory effort normal. Cardiovascular system: S1 & S2 heard, RRR. No JVD, murmurs, rubs, gallops or clicks. No pedal edema. Gastrointestinal system: Abdomen is nondistended, soft and very mild epigastric tenderness. No organomegaly or masses felt. Normal bowel sounds heard. Central nervous system: Alert and oriented. No focal neurological deficits. Extremities: Symmetric 5 x 5 power. Skin: No rashes, lesions or ulcers.  Psychiatry: Judgement and insight appear normal. Mood & affect appropriate.    Data Reviewed: I have personally reviewed following labs and imaging studies  CBC: Recent Labs  Lab 02/02/24 0551 02/03/24 0500 02/04/24 0417 02/05/24 0332 02/06/24 0500  WBC 5.1 6.3 7.3 6.1 5.7  HGB 11.1* 10.7* 10.8* 10.0* 10.0*  HCT 33.8* 33.0* 33.0* 30.9* 30.3*  MCV 87.3 87.5 87.1 88.5 87.3  PLT 282 308 287 270 300   Basic Metabolic Panel: Recent Labs  Lab 01/31/24 0422 02/01/24 0244 02/02/24 0551 02/03/24 0500 02/04/24 0417 02/06/24 0500  NA 141 137 138 136 135 138  K  3.8 4.3 4.2 4.0 4.0 4.0  CL 103 104 104 103 101 103  CO2 26 25 27 27 26 26   GLUCOSE 139* 128* 144* 133* 146* 135*  BUN 9 10 10 12 14 18   CREATININE 0.60* 0.55* 0.55* 0.54* 0.52* 0.53*  CALCIUM  8.8* 8.9 9.0 9.0 9.1 9.0  MG 2.0 2.0 2.1 2.1  --  1.8  PHOS 4.0 4.2 4.1 4.4  --  4.4   GFR: Estimated Creatinine Clearance: 122.8 mL/min (A) (by C-G formula based on SCr of 0.53 mg/dL (L)). Liver Function Tests: Recent Labs  Lab 02/03/24 0500 02/06/24 0500  AST 29 22  ALT 34 27  ALKPHOS 134* 103  BILITOT 0.2 0.3  PROT 6.8 6.5  ALBUMIN  3.2* 3.2*   No results for input(s): LIPASE, AMYLASE in the last 168 hours. No results for input(s): AMMONIA in the  last 168 hours. Coagulation Profile: Recent Labs  Lab 02/02/24 0551 02/03/24 0500 02/04/24 0417 02/05/24 0332 02/06/24 0500  INR 1.3* 1.5* 1.5* 1.4* 1.5*   Cardiac Enzymes: No results for input(s): CKTOTAL, CKMB, CKMBINDEX, TROPONINI in the last 168 hours. BNP (last 3 results) No results for input(s): PROBNP in the last 8760 hours. HbA1C: No results for input(s): HGBA1C in the last 72 hours. CBG: Recent Labs  Lab 02/05/24 1130 02/05/24 1707 02/05/24 2116 02/05/24 2352 02/06/24 0505  GLUCAP 124* 134* 144* 137* 142*   Lipid Profile: No results for input(s): CHOL, HDL, LDLCALC, TRIG, CHOLHDL, LDLDIRECT in the last 72 hours.  Thyroid Function Tests: No results for input(s): TSH, T4TOTAL, FREET4, T3FREE, THYROIDAB in the last 72 hours. Anemia Panel: No results for input(s): VITAMINB12, FOLATE, FERRITIN, TIBC, IRON, RETICCTPCT in the last 72 hours. Sepsis Labs: No results for input(s): PROCALCITON, LATICACIDVEN in the last 168 hours.  No results found for this or any previous visit (from the past 240 hours).   Radiology Studies: DG Abd 1 View Result Date: 02/06/2024 CLINICAL DATA:  [id number] Encounter for feeding tube placement [phone]. EXAM: ABDOMEN - 1 VIEW COMPARISON:  01/27/2024. FINDINGS: The bowel gas pattern is non-obstructive. No evidence of pneumoperitoneum, within the limitations of a supine film. No acute osseous abnormalities. The soft tissues are within normal limits. Surgical changes, devices, tubes and lines: There is retrograde opacification of the proximal small bowel loop, likely proximal duodenum with positive contrast. There is no opacification of stomach. No frank extravasation of contrast outside the presumed confines of the bowel, to suggest frank leak. IMPRESSION: Nonobstructive bowel gas pattern. There is retrograde opacification of the proximal small bowel loop, likely proximal duodenum with positive contrast.  There is no opacification of stomach. No frank extravasation of contrast outside the presumed confines of the bowel, to suggest frank leak. Electronically Signed   By: Ree Molt M.D.   On: 02/06/2024 08:07    Scheduled Meds:  aspirin  EC  81 mg Oral Daily   Chlorhexidine  Gluconate Cloth  6 each Topical Daily   feeding supplement  1 Container Oral TID WC   insulin  aspart  0-9 Units Subcutaneous Q6H   lipase/protease/amylase  72,000 Units Oral TID AC   metoprolol  succinate  12.5 mg Oral QHS   pantoprazole  (PROTONIX ) IV  40 mg Intravenous Q12H   polyethylene glycol  17 g Oral Daily   sodium chloride  flush  3 mL Intravenous Q12H   warfarin  6 mg Oral ONCE-1600   Warfarin - Pharmacist Dosing Inpatient   Does not apply q1600   Continuous Infusions:  TPN ADULT (ION) 90  mL/hr at 02/05/24 1721     LOS: 26 days   Fredia Skeeter, MD [facility]  02/06/2024, 11:09 AM   *Please note that this is a verbal dictation therefore any spelling or grammatical errors are due to the Dragon Medical One system interpretation.  Please page via Amion and do not message via secure chat for urgent patient care matters. Secure chat can be used for non urgent patient care matters.  How to contact the [facility] Attending or Consulting provider 7A - 7P or covering provider during after hours 7P -7A, for this patient?  Check the care team in [facility] and look for a) attending/consulting TR[facility] provider listed and b) the [facility] team listed. Page or secure chat 7A-7P. Log into [email or web address] and use [facility] universal password to access. If you do not have the password, please contact the hospital operator. Locate the [facility] provider you are looking for under [facility] and page to a number that you can be directly reached. If you still have difficulty reaching the provider, please page the [facility] (Director on Call) for the Hospitalists listed on amion for assistance.

## 2024-02-06 NOTE — Plan of Care (Signed)

## 2024-02-06 NOTE — Progress Notes (Signed)
 PHARMACY - ANTICOAGULATION CONSULT NOTE  Pharmacy Consult for Warfarin Indication: On-X mechanical AVR  Allergies  Allergen Reactions   Amoxicillin Hives   Patient Measurements: Height: 5' 11 (180.3 cm) Weight: 66.1 kg (145 lb 11.6 oz) IBW/kg (Calculated) : 75.3 HEPARIN  DW (KG): 71  Vital Signs: Temp: 97.6 F (36.4 C) (08/28 0503) BP: 111/80 (08/28 0503) Pulse Rate: 64 (08/28 0503)  Labs: Recent Labs    02/04/24 0417 02/05/24 0332 02/06/24 0500  HGB 10.8* 10.0* 10.0*  HCT 33.0* 30.9* 30.3*  PLT 287 270 300  LABPROT 18.6* 17.9* 18.7*  INR 1.5* 1.4* 1.5*  CREATININE 0.52*  --  0.53*   Estimated Creatinine Clearance: 122.8 mL/min (A) (by C-G formula based on SCr of 0.53 mg/dL (L)).  Assessment: 33 yo M presenting with pancreatitis. Warfarin PTA regimen is 6 mg Monday and Thursday and 4.5 mg all other days (total weekly dose: 34.5 mg).  It has been difficult to maintain INR within the narrow 1.5-2 goal range, especially in previous days due to issues with enteral intake.   On 8/23, INR remained subtherapeutic at 1.2 on variable warfarin dosing. Previously supratherapeutic with less than home dose of warfarin, although was having issues with nutrition intake and patient is now receiving TPN to meet nutritional needs.   INR therapeutic at 1.5. Stop enoxaparin  - INR therapeutic x2 days. Hgb stable and plt WNL. No issues with bleeding documented.   Goal of Therapy:  INR 1.5-2.0 Monitor platelets by anticoagulation protocol: Yes   Plan:  Give warfarin 6 mg PO x1 again tonight. Check INR daily while on warfarin Continue to monitor H&H and platelets   Harlene Barlow, Berdine BIRCH, BCPS, BCCP Clinical Pharmacist  02/06/2024 7:34 AM   [facility] pharmacy phone numbers are listed on [email or web address]

## 2024-02-07 DIAGNOSIS — K861 Other chronic pancreatitis: Secondary | ICD-10-CM | POA: Diagnosis not present

## 2024-02-07 DIAGNOSIS — K859 Acute pancreatitis without necrosis or infection, unspecified: Secondary | ICD-10-CM | POA: Diagnosis not present

## 2024-02-07 LAB — CBC
HCT: 30.8 % — ABNORMAL LOW (ref 39.0–52.0)
Hemoglobin: 10.1 g/dL — ABNORMAL LOW (ref 13.0–17.0)
MCH: 28.6 pg (ref 26.0–34.0)
MCHC: 32.8 g/dL (ref 30.0–36.0)
MCV: 87.3 fL (ref 80.0–100.0)
Platelets: 299 K/uL (ref 150–400)
RBC: 3.53 MIL/uL — ABNORMAL LOW (ref 4.22–5.81)
RDW: 13.5 % (ref 11.5–15.5)
WBC: 6.7 K/uL (ref 4.0–10.5)
nRBC: 0 % (ref 0.0–0.2)

## 2024-02-07 LAB — GLUCOSE, CAPILLARY
Glucose-Capillary: 142 mg/dL — ABNORMAL HIGH (ref 70–99)
Glucose-Capillary: 120 mg/dL — ABNORMAL HIGH (ref 70–99)
Glucose-Capillary: 118 mg/dL — ABNORMAL HIGH (ref 70–99)
Glucose-Capillary: 128 mg/dL — ABNORMAL HIGH (ref 70–99)
Glucose-Capillary: 114 mg/dL — ABNORMAL HIGH (ref 70–99)

## 2024-02-07 LAB — PROTIME-INR
INR: 1.6 — ABNORMAL HIGH (ref 0.8–1.2)
Prothrombin Time: 19.9 s — ABNORMAL HIGH (ref 11.4–15.2)

## 2024-02-07 MED ORDER — WARFARIN SODIUM 6 MG PO TABS
6.0000 mg | ORAL_TABLET | Freq: Every day | ORAL | Status: DC
  Administered 2024-02-07 – 2024-02-09 (×3): 6 mg via ORAL
  Filled 2024-02-07 (×4): qty 1

## 2024-02-07 MED ORDER — TRAVASOL 10 % IV SOLN
INTRAVENOUS | Status: DC
  Filled 2024-02-07: qty 1296

## 2024-02-07 MED ORDER — TRAVASOL 10 % IV SOLN
INTRAVENOUS | Status: AC
  Filled 2024-02-07: qty 648

## 2024-02-07 NOTE — TOC Progression Note (Signed)
 Transition of Care [facility]) - Progression Note    Patient Details  Name: [name] [name] MRN: [id number] Date of Birth: 1991/03/14  Transition of [facility]) CM/SW Contact  [name] JONELLE Joe, RN Phone Number: 02/07/2024, 9:49 AM  Clinical Narrative:    CM spoke with Marjorie, [place] with [facility] and surgery is continuing to follow the patient for enteral feeding tube placement at some point.  No date/time have been coordinated at this time.  I called and update Holley Herring, RNCM with [employer] to make her aware that patient is still waiting on tube placement.  I spoke with the patient's mother and she received update at the bedside with Marjorie, [place] with surgery.                     Expected Discharge Plan and Services                                               Social Drivers of Health (SDOH) Interventions SDOH Screenings   Food Insecurity: No Food Insecurity (01/11/2024)  Housing: Low Risk  (01/11/2024)  Transportation Needs: No Transportation Needs (01/11/2024)  Utilities: Not At Risk (01/11/2024)  Depression (PHQ2-9): Medium Risk (09/09/2023)  Financial Resource Strain: Low Risk  (04/25/2023)  Physical Activity: Sufficiently Active (04/25/2023)  Social Connections: Unknown (04/25/2023)  Stress: No Stress Concern Present (04/25/2023)  Tobacco Use: Low Risk  (01/20/2024)    Readmission Risk Interventions    01/29/2024   12:53 PM 01/15/2024   11:03 AM  Readmission Risk Prevention Plan  Post Dischage Appt  Complete  Medication Screening  Complete  Transportation Screening Complete Complete  PCP or Specialist Appt within 5-7 Days Complete   Home Care Screening Complete   Medication Review (RN CM) Complete

## 2024-02-07 NOTE — Progress Notes (Signed)
 Progress Note     Subjective: Patient reports some epigastric discomfort that is manageable. Reports taking in liquids yesterday. Ensure caused burping for 4 hours with some nausea. He had a bowel movement yesterday. No BM today. Reports flatulence. Denies vomiting.  ROS  All negative with the exception of above.  Objective: Vital signs in last 24 hours: Temp:  [97.6 F (36.4 C)-100.1 F (37.8 C)] 100.1 F (37.8 C) (08/29 0832) Pulse Rate:  [62-88] 62 (08/29 0832) Resp:  [16-20] 19 (08/29 0832) BP: (96-119)/(59-83) 96/59 (08/29 0832) SpO2:  [99 %-100 %] 100 % (08/29 0832) Weight:  [66.1 kg] 66.1 kg (08/29 0500) Last BM Date : 02/06/24  Intake/Output from previous day: 08/28 0701 - 08/29 0700 In: 2458.4 [P.O.:100; I.V.:1734.6; NG/GT:623.8] Out: -  Intake/Output this shift: No intake/output data recorded.  PE: General: Pleasant male who is laying in bed in NAD. Lungs: Respiratory effort nonlabored Abd: Soft and ND. Mild tenderness to palpation of epigastric region. No rebound tenderness or guarding. Psych: A&Ox3 with an appropriate affect.  Lab Results:  Recent Labs    02/06/24 0500 02/07/24 0344  WBC 5.7 6.7  HGB 10.0* 10.1*  HCT 30.3* 30.8*  PLT 300 299   BMET Recent Labs    02/06/24 0500  NA 138  K 4.0  CL 103  CO2 26  GLUCOSE 135*  BUN 18  CREATININE 0.53*  CALCIUM  9.0   PT/INR Recent Labs    02/06/24 0500 02/07/24 0344  LABPROT 18.7* 19.9*  INR 1.5* 1.6*   CMP     Component Value Date/Time   NA 138 02/06/2024 0500   NA 142 08/30/2021 1430   K 4.0 02/06/2024 0500   CL 103 02/06/2024 0500   CO2 26 02/06/2024 0500   GLUCOSE 135 (H) 02/06/2024 0500   BUN 18 02/06/2024 0500   BUN 13 08/30/2021 1430   CREATININE 0.53 (L) 02/06/2024 0500   CALCIUM  9.0 02/06/2024 0500   PROT 6.5 02/06/2024 0500   ALBUMIN  3.2 (L) 02/06/2024 0500   AST 22 02/06/2024 0500   ALT 27 02/06/2024 0500   ALKPHOS 103 02/06/2024 0500   BILITOT 0.3 02/06/2024  0500   GFRNONAA >60 02/06/2024 0500   Lipase     Component Value Date/Time   LIPASE 344 (H) 01/27/2024 0229       Studies/Results: DG Abd 1 View Result Date: 02/06/2024 CLINICAL DATA:  [id number] Encounter for feeding tube placement [id number]. EXAM: ABDOMEN - 1 VIEW COMPARISON:  01/27/2024. FINDINGS: The bowel gas pattern is non-obstructive. No evidence of pneumoperitoneum, within the limitations of a supine film. No acute osseous abnormalities. The soft tissues are within normal limits. Surgical changes, devices, tubes and lines: There is retrograde opacification of the proximal small bowel loop, likely proximal duodenum with positive contrast. There is no opacification of stomach. No frank extravasation of contrast outside the presumed confines of the bowel, to suggest frank leak. IMPRESSION: Nonobstructive bowel gas pattern. There is retrograde opacification of the proximal small bowel loop, likely proximal duodenum with positive contrast. There is no opacification of stomach. No frank extravasation of contrast outside the presumed confines of the bowel, to suggest frank leak. Electronically Signed   By: Ree Molt M.D.   On: 02/06/2024 08:07    Anti-infectives: Anti-infectives (From admission, onward)    Start     Dose/Rate Route Frequency Ordered Stop   01/18/24 1230  rifaximin  (XIFAXAN ) tablet 550 mg  Status:  Discontinued        550  mg Oral 3 times daily 01/18/24 1132 01/26/24 1323        Assessment/Plan Acute on chronic pancreatitis Calcifications of head of the pancreas Pancreatic pseudocyst Gastric pneumatosis -Most recent CT imaging: CT abd/pel w contrast 8/21 showing no evidence of gastric pneumatosis. Stable multilocular cystic process anterior to the pancreatic head, consistent with pseudocysts.  -WBC 6.7. HGB 10.1 -Afebrile. Vitals stable. -PP feeding access obtained.  -DG abd 1 view from 8/29 showed retrograde opacification of the proximal small bowel loop, likely  proximal duodenum with positive contrast. There is no opacification of stomach. No frank extravasation of contrast outside the presumed confines of the bowel, to suggest frank leak. -Recommending TF up to goal and wean TPN.  -No acute surgical intervention recommended at this time. Communication with IR ongoing regarding long term feeding access. -GI continuing to follow peripherally. Patient to have CT imaging prior to ERCP that is scheduled for outpatient.   FEN: TFs; TPN; Feeding supplement (BOOST); Heart diet; IVF per primary team VTE: Warfarin ID: None currently   LOS: 27 days   I reviewed specialist notes, hospitalist notes, last 24 h vitals and pain scores, last 48 h intake and output, last 24 h labs and trends, and last 24 h imaging results.  This care required moderate level of medical decision making.    Marjorie Carlyon Favre, [facility] Surgery 02/07/2024, 9:18 AM Please see Amion for pager number during day hours 7:00am-4:30pm

## 2024-02-07 NOTE — Progress Notes (Signed)
 PHARMACY - ANTICOAGULATION CONSULT NOTE  Pharmacy Consult for Warfarin Indication: On-X mechanical AVR  Allergies  Allergen Reactions   Amoxicillin Hives   Patient Measurements: Height: 5' 11 (180.3 cm) Weight: 66.1 kg (145 lb 11.6 oz) IBW/kg (Calculated) : 75.3 HEPARIN  DW (KG): 71  Vital Signs: Temp: 100.1 F (37.8 C) (08/29 0832) Temp Source: Oral (08/29 0832) BP: 96/59 (08/29 0832) Pulse Rate: 62 (08/29 0832)  Labs: Recent Labs    02/05/24 0332 02/06/24 0500 02/07/24 0344  HGB 10.0* 10.0* 10.1*  HCT 30.9* 30.3* 30.8*  PLT 270 300 299  LABPROT 17.9* 18.7* 19.9*  INR 1.4* 1.5* 1.6*  CREATININE  --  0.53*  --    Estimated Creatinine Clearance: 122.8 mL/min (A) (by C-G formula based on SCr of 0.53 mg/dL (L)).  Assessment: 33 yo M presenting with pancreatitis. Warfarin PTA regimen is 6 mg Monday and Thursday and 4.5 mg all other days (total weekly dose: 34.5 mg).  It has been difficult to maintain INR within the narrow 1.5-2 goal range, especially in previous days due to issues with enteral intake.   On 8/23, INR remained subtherapeutic at 1.2 on variable warfarin dosing. Previously supratherapeutic with less than home dose of warfarin, although was having issues with nutrition intake and patient is now receiving TPN to meet nutritional needs.   INR therapeutic at 1.6  Goal of Therapy:  INR 1.5-2.0 Monitor platelets by anticoagulation protocol: Yes   Plan:  Warfarin 6 mg po daily  Check INR daily while on warfarin Continue to monitor H&H and platelets   Thank you. Olam Monte, PharmD  02/07/2024 8:53 AM   [facility] pharmacy phone numbers are listed on [email or web address]

## 2024-02-07 NOTE — Plan of Care (Signed)

## 2024-02-07 NOTE — Progress Notes (Addendum)
 PHARMACY - TOTAL PARENTERAL NUTRITION CONSULT NOTE   Indication: severe pancreatitis and anticipated prolong NPO status  Patient Measurements: Height: 5' 11 (180.3 cm) Weight: 66.1 kg (145 lb 11.6 oz) IBW/kg (Calculated) : 75.3 TPN AdjBW (KG): 71 Body mass index is 20.32 kg/m. Usual Weight: 66 kg  Assessment:  33 y.o. year old male with a past medical history significant for chronic pancreatitis, history of aortic valve replacement requiring chronic anticoagulation, SIBO s/p course of rifaximin . Surgery has recommended postpyloric feedings be held due to the presence of pneumatosis. Mansouraty who recommends against any endoscopic intervention at this time due to gastric pneumatosis and immature wall of the pancreas fluid collection. PEJ to be considered when enteral feeding can be safely resumed. Pharmacy consulted to initiate TPN in the interim.  Per patient, had mild flushing and headache overnight, resolved without needing interventions. No issues with nausea/vomiting. No BM since 8/15 per charting and confirmation by patient. Still good UOP per patient.   Glucose / Insulin : no hx DM, CBGs < 180; 5 units insulin  given in prior 24 hours Electrolytes: Na 138, K 4.0, Cl 103, CoCa 9.7, Phos 4.4, Mg 1.8 Renal: Scr < 1, BUN 14 Hepatic: AST/ALT wnl, alb 3.2, Tbili 0.20.3 TG 179 [goal <150] Intake / Output:  LBM 8/28, UOP 3+ unmeasured   GI Imaging: 8/02 CT: severe sequelae of advanced pancreatitis 8/14 MRCP: acute pancreatitis, severe pancreatic ductal dilatation, concerning for ulceration to the bowel lumen 8/18 CT: gas density between the intraluminal gastric fluid and the wall of the stomach in the posterior and lateral portions of the fundus, concerning for pneumatosis 8/21 CT abd shows no evidence of gastric pneumatosis, stable multilocular cystic process anterior to the pancreatic head 8/28 DG abd shows nonobstructive bowel gas pattern  GI Surgeries / Procedures:  8/27 Cortak  placed (not usable yet)  Central access: 01/29/24 TPN start date: 01/29/24  Nutritional Goals: Goal TPN rate is 90 mL/hr (provides 130 g of protein and 2100 kcals per day)  RD Assessment: Estimated Needs Total Energy Estimated Needs: 2200-2600 kcal Total Protein Estimated Needs: 80-100 g Total Fluid Estimated Needs: >/=2L  Current Nutrition:  8/20 NPO, TPN 8/22 TPN, CLD 8/23 TPN, FLD  Plan:  Change to half rate TPN at 45 mL/hr to provide 50% nutritional needs and stop after current bag - Reduced lipids and increased protein to compensate d/t elevation in triglycerides [02/03/24 >>] Electrolytes in TPN: Na 8mEq/L, K 15mEq/L, Ca 5mEq/L, Mg 57mEq/L, and Phos 15mmol/L. Cl:Ac 1:1 Add standard MVI and trace elements to TPN Continue Sensitive q6h SSI and adjust as needed  Monitor TPN labs on Mon/Thurs, daily PRN F/u cycling tomorrow if able   Thank you for allowing pharmacy to be a part of this patient's care.  Benedetta Heath BS, PharmD, BCPS Clinical Pharmacist 02/07/2024 7:02 AM  Contact: [phone] after 3 PM

## 2024-02-07 NOTE — Progress Notes (Signed)
 PROGRESS NOTE    [name] [name]  FMW:[id number] DOB: [birth year] DOA: 01/11/2024 PCP: Lucius Krabbe, NP   Brief Narrative:  Patient is a 33 year old male with past medical history significant for hypertension, s/p mechanical aortic valve replacement 09/27/21 on chronic warfarin anticoagulation, recurrent pancreatitis with multiple hospitalizations, asthma, GERD, prior history of alcohol use disorder (abstinent for 10 months PTA).  Patient presented to the hospital on 01/11/2024 with complaints of abdominal pain.  Workup revealed and had episode of pancreatitis, with pseudocyst and acute kidney injury.  CT A/P done on 8/18 revealed gastric pneumatosis, general surgery was consulted, octreotide  stopped.  Patient was unable to tolerate NG tube placement, made n.p.o. again, PICC line was placed and patient was started on TPN on 8/20.  CT abdomen pelvis on 01/30/2024 revealed resolution of the pneumatosis.  General surgery team has okayed dietary advancement.  GI team is assisting in patient's management.  Calorie count is in process, as patient has lost significant amount of weight.  Patient is felt to be malnourished.  Patient does not want to go home with TPN, he would rather have J-tube placed before discharge.  General surgery is following, calorie count in progress.  Plan of J-tube based on the calorie count.    Assessment & Plan:   Principal Problem:   Acute on chronic pancreatitis [facility]) Active Problems:   Pseudocyst of pancreas   S/P AVR (aortic valve replacement) and aortoplasty   Essential hypertension   On pre-exposure prophylaxis for HIV   History of asthma   History of alcohol abuse   Protein-calorie malnutrition, severe   Intraabdominal fluid collection  Acute on chronic pancreatitis, recurrent pancreatitis Acute fluid collection/pseudocyst Gastric pneumatosis -As per chart review, EUS by Dr. Wilhelmenia in 07/2023 that showed cystic lesion at the head of the pancreas  consistent with a pseudocyst and a 5 mm stone within the pancreatic duct at the junction of the head/neck.   -Cytology of the cyst was benign.  He was scheduled for an ERCP with attempted stone extraction in September with Dr. Wilhelmenia but now admitted with abdominal pain. -[facility] [place] [facility] was consulted, patient was treated supportively with n.p.o., IVF, core track with postpyloric feeding (initially started on 8/6).  Core track was dislodged on 8/18, briefly treated with liquid diet and core track was replaced.   -CT A/P on 8/18 showed gastric pneumatosis, general surgery was consulted, octreotide  stopped (to avoid dysmotility), patient was unable to tolerate NG tube placement, made n.p.o. again/tube feeds held, gentle IVF continuing, PICC line placed and TPN started on 01/29/2024. -Pneumatosis has resolved.  Cortrack tube has been removed.  Patient is on TPN. - Input from GI surgical team is highly appreciated.  Patient is tolerating heart healthy diet intermittently, general surgery on board, patient prefers to go home with J-tube instead of TPN.  General surgery discussed with IR and they recommended placing postpyloric tube and then consider him for percutaneous GJ or J-tube.  Patient had his postpyloric tube placed by IR 02/06/2024, tube feeds started, also getting TPN but getting weaned from TPN, ongoing conversations going on between general surgery and IR for final plans about long-term nutritional needs.  Appreciate their help.   S/p mechanical aortic valve replacement and aortoplasty -INR still subtherapeutic.  Continue bridging Lovenox  and Coumadin  dosing per pharmacy.  Continue aspirin . -Follows with Dr. Wendel, Cardiology and the Coumadin  Clinic   Essential hypertension - Blood pressure controlled.  Continue Toprol -XL 12.5 mg p.o. nightly.     Alcohol  use disorder, in remission Stable   Asthma Stable   Anemia of Chronic Disease Stable.   Small intestinal bacterial overgrowth  (SIBO) Completed 14 days of rifaximin .  Resolved.   Nutrition Status: Nutrition Problem: Severe Malnutrition Calorie count to see progress.   Tolerating clear liquids. Continue TPN. TPN on discharge.. Body mass index is 20.57 kg/m.  DVT prophylaxis: Coumadin /Lovenox    Code Status: Full Code  Family Communication: Patient's mother present at bedside.  Plan of care discussed with patient in length and he/she verbalized understanding and agreed with it.  Status is: Inpatient Remains inpatient appropriate because: Awaiting further plans by general surgery.   Estimated body mass index is 20.32 kg/m as calculated from the following:   Height as of this encounter: 5' 11 (1.803 m).   Weight as of this encounter: 66.1 kg.    Nutritional Assessment: Body mass index is 20.32 kg/m.SABRA Seen by dietician.  I agree with the assessment and plan as outlined below: Nutrition Status: Nutrition Problem: Severe Malnutrition Etiology: vomiting, chronic illness, altered GI function Signs/Symptoms: moderate fat depletion, severe muscle depletion Interventions: Refer to RD note for recommendations  . Skin Assessment: I have examined the patient's skin and I agree with the wound assessment as performed by the wound care RN as outlined below:    Consultants:  GI and general surgery  Procedures:  As above  Antimicrobials:  Anti-infectives (From admission, onward)    Start     Dose/Rate Route Frequency Ordered Stop   01/18/24 1230  rifaximin  (XIFAXAN ) tablet 550 mg  Status:  Discontinued        550 mg Oral 3 times daily 01/18/24 1132 01/26/24 1323         Subjective: Seen and examined.  He is feeling better.  Intermittent nausea but improving.  No vomiting since last 2 days.  Objective: Vitals:   02/06/24 2100 02/07/24 0500 02/07/24 0507 02/07/24 0832  BP: 119/83  108/70 (!) 96/59  Pulse: 88  64 62  Resp: 20  20 19   Temp: 98.2 F (36.8 C)  97.6 F (36.4 C) 100.1 F (37.8 C)   TempSrc: Oral  Oral Oral  SpO2: 100%  99% 100%  Weight:  66.1 kg    Height:        Intake/Output Summary (Last 24 hours) at 02/07/2024 1004 Last data filed at 02/07/2024 0942 Gross per 24 hour  Intake 2530.44 ml  Output --  Net 2530.44 ml   Filed Weights   02/05/24 0411 02/06/24 0500 02/07/24 0500  Weight: 67.1 kg 66.1 kg 66.1 kg    Examination:  General exam: Appears calm and comfortable  Respiratory system: Clear to auscultation. Respiratory effort normal. Cardiovascular system: S1 & S2 heard, RRR. No JVD, murmurs, rubs, gallops or clicks. No pedal edema. Gastrointestinal system: Abdomen is nondistended, soft and nontender. No organomegaly or masses felt. Normal bowel sounds heard. Central nervous system: Alert and oriented. No focal neurological deficits. Extremities: Symmetric 5 x 5 power. Skin: No rashes, lesions or ulcers.  Psychiatry: Judgement and insight appear normal. Mood & affect appropriate.   Data Reviewed: I have personally reviewed following labs and imaging studies  CBC: Recent Labs  Lab 02/03/24 0500 02/04/24 0417 02/05/24 0332 02/06/24 0500 02/07/24 0344  WBC 6.3 7.3 6.1 5.7 6.7  HGB 10.7* 10.8* 10.0* 10.0* 10.1*  HCT 33.0* 33.0* 30.9* 30.3* 30.8*  MCV 87.5 87.1 88.5 87.3 87.3  PLT 308 287 270 300 299   Basic Metabolic Panel: Recent Labs  Lab 02/01/24 0244 02/02/24 0551 02/03/24 0500 02/04/24 0417 02/06/24 0500  NA 137 138 136 135 138  K 4.3 4.2 4.0 4.0 4.0  CL 104 104 103 101 103  CO2 25 27 27 26 26   GLUCOSE 128* 144* 133* 146* 135*  BUN 10 10 12 14 18   CREATININE 0.55* 0.55* 0.54* 0.52* 0.53*  CALCIUM  8.9 9.0 9.0 9.1 9.0  MG 2.0 2.1 2.1  --  1.8  PHOS 4.2 4.1 4.4  --  4.4   GFR: Estimated Creatinine Clearance: 122.8 mL/min (A) (by C-G formula based on SCr of 0.53 mg/dL (L)). Liver Function Tests: Recent Labs  Lab 02/03/24 0500 02/06/24 0500  AST 29 22  ALT 34 27  ALKPHOS 134* 103  BILITOT 0.2 0.3  PROT 6.8 6.5  ALBUMIN   3.2* 3.2*   No results for input(s): LIPASE, AMYLASE in the last 168 hours. No results for input(s): AMMONIA in the last 168 hours. Coagulation Profile: Recent Labs  Lab 02/03/24 0500 02/04/24 0417 02/05/24 0332 02/06/24 0500 02/07/24 0344  INR 1.5* 1.5* 1.4* 1.5* 1.6*   Cardiac Enzymes: No results for input(s): CKTOTAL, CKMB, CKMBINDEX, TROPONINI in the last 168 hours. BNP (last 3 results) No results for input(s): PROBNP in the last 8760 hours. HbA1C: No results for input(s): HGBA1C in the last 72 hours. CBG: Recent Labs  Lab 02/06/24 1220 02/06/24 1650 02/06/24 2136 02/07/24 0507 02/07/24 0837  GLUCAP 122* 162* 133* 142* 120*   Lipid Profile: No results for input(s): CHOL, HDL, LDLCALC, TRIG, CHOLHDL, LDLDIRECT in the last 72 hours.  Thyroid Function Tests: No results for input(s): TSH, T4TOTAL, FREET4, T3FREE, THYROIDAB in the last 72 hours. Anemia Panel: No results for input(s): VITAMINB12, FOLATE, FERRITIN, TIBC, IRON, RETICCTPCT in the last 72 hours. Sepsis Labs: No results for input(s): PROCALCITON, LATICACIDVEN in the last 168 hours.  No results found for this or any previous visit (from the past 240 hours).   Radiology Studies: DG Abd 1 View Result Date: 02/06/2024 CLINICAL DATA:  [id number] Encounter for feeding tube placement [phone]. EXAM: ABDOMEN - 1 VIEW COMPARISON:  01/27/2024. FINDINGS: The bowel gas pattern is non-obstructive. No evidence of pneumoperitoneum, within the limitations of a supine film. No acute osseous abnormalities. The soft tissues are within normal limits. Surgical changes, devices, tubes and lines: There is retrograde opacification of the proximal small bowel loop, likely proximal duodenum with positive contrast. There is no opacification of stomach. No frank extravasation of contrast outside the presumed confines of the bowel, to suggest frank leak. IMPRESSION: Nonobstructive bowel gas  pattern. There is retrograde opacification of the proximal small bowel loop, likely proximal duodenum with positive contrast. There is no opacification of stomach. No frank extravasation of contrast outside the presumed confines of the bowel, to suggest frank leak. Electronically Signed   By: Ree Molt M.D.   On: 02/06/2024 08:07    Scheduled Meds:  aspirin  EC  81 mg Oral Daily   bisacodyl   10 mg Rectal Once   Chlorhexidine  Gluconate Cloth  6 each Topical Daily   free water   25 mL Per Tube Q1H   insulin  aspart  0-9 Units Subcutaneous Q6H   lipase/protease/amylase  72,000 Units Oral TID AC   magnesium  citrate  1 Bottle Oral Once   magnesium  hydroxide  30 mL Oral Once   methylnaltrexone   12 mg Subcutaneous Once   metoprolol  succinate  12.5 mg Oral QHS   pantoprazole  (PROTONIX ) IV  40 mg Intravenous Q12H   polyethylene glycol  17 g Oral TID   senna  2 tablet Oral Once   sodium chloride  flush  3 mL Intravenous Q12H   warfarin  6 mg Oral q1600   Warfarin - Pharmacist Dosing Inpatient   Does not apply q1600   Continuous Infusions:  feeding supplement (OSMOLITE 1.5 CAL) 70 mL/hr at 02/06/24 1839   TPN ADULT (ION) 90 mL/hr at 02/06/24 1758   TPN ADULT (ION)       LOS: 27 days   Fredia Skeeter, MD [facility]  02/07/2024, 10:04 AM   *Please note that this is a verbal dictation therefore any spelling or grammatical errors are due to the Dragon Medical One system interpretation.  Please page via Amion and do not message via secure chat for urgent patient care matters. Secure chat can be used for non urgent patient care matters.  How to contact the [facility] Attending or Consulting provider 7A - 7P or covering provider during after hours 7P -7A, for this patient?  Check the care team in [facility] and look for a) attending/consulting [facility] provider listed and b) the [facility] team listed. Page or secure chat 7A-7P. Log into [email or web address] and use [facility] universal password to access. If you do  not have the password, please contact the hospital operator. Locate the [facility] provider you are looking for under [facility] and page to a number that you can be directly reached. If you still have difficulty reaching the provider, please page the [facility] (Director on Call) for the Hospitalists listed on amion for assistance.

## 2024-02-07 NOTE — Progress Notes (Signed)
 Nutrition Short Note   Spoke to pt and mother in room. Pt feeling alright this morning, tolerating enteral nutrition at 45 ml/hr (goal 70 ml/hr). TPN still running at 90 ml/hr, messaged Pharmacy to inquire about reducing TPN. Discussed plan of EN tolerance via Cortrak, then PEJ or PEG/J placement, then home on enteral nutrition.   Pt continues to not tolerate much PO intake, if any. Not tolerating solids at this time. Calorie count continues to be ordered, sign/folder on door, however, no meal tickets or PO intake saved. Documented meal intakes 0% per chart.   Long discussion regarding pancreatitis, upcoming procedure for stent, PO intake recommendations, home enteral nutrition recommendations. Pt's mother concerned regarding pt's weight accuracy via bed scale; requested standing weight from nursing. Answer all of mother's and pt's questions at this time, will continue to monitor, RDN available prn. Encouraged pt/mother to follow up with this RD with any questions.  INTERVENTION:  -Osmolite 1.5 continuous @ 20 ml/hr to be advanced as tolerated 10 ml q-6 to a goal rate of 70 ml/hr via Cortrak. FWF 25 ml q-1 At goal this provides 2520 kcal, 105 g pro, 1277 ml  Fluid total of 1877 ml fluid   -Titrate TPN per pharmacy recommendations   -Daily weights   -Continue to strongly recommend PEJ or PEG/J tube placement and post-pyloric EN as most appropriate nutrition support intervention  Estimated Nutritional Needs:    Kcal:  2200-2600 kcal   Protein:  80-100 g   Fluid:  >/=2L    NUTRITION DIAGNOSIS:    Severe Malnutrition related to vomiting, chronic illness, altered GI function as evidenced by moderate fat depletion, severe muscle depletion.   Ongoing   GOAL:    Patient will meet greater than or equal to 90% of their needs   Met with TPN   MONITOR:    PO intake, Weight trends, TF tolerance, Supplement acceptance, Diet advancement, Labs, Skin   [name] [name], RDN,  LDN Registered Dietitian Nutritionist RD Inpatient Contact Info in [place]

## 2024-02-08 DIAGNOSIS — K861 Other chronic pancreatitis: Secondary | ICD-10-CM | POA: Diagnosis not present

## 2024-02-08 DIAGNOSIS — K859 Acute pancreatitis without necrosis or infection, unspecified: Secondary | ICD-10-CM | POA: Diagnosis not present

## 2024-02-08 LAB — PROTIME-INR
INR: 1.7 — ABNORMAL HIGH (ref 0.8–1.2)
Prothrombin Time: 20.4 s — ABNORMAL HIGH (ref 11.4–15.2)

## 2024-02-08 LAB — GLUCOSE, CAPILLARY
Glucose-Capillary: 141 mg/dL — ABNORMAL HIGH (ref 70–99)
Glucose-Capillary: 103 mg/dL — ABNORMAL HIGH (ref 70–99)
Glucose-Capillary: 139 mg/dL — ABNORMAL HIGH (ref 70–99)
Glucose-Capillary: 130 mg/dL — ABNORMAL HIGH (ref 70–99)

## 2024-02-08 MED ORDER — WARFARIN SODIUM 6 MG PO TABS
6.0000 mg | ORAL_TABLET | Freq: Once | ORAL | Status: DC
  Filled 2024-02-08: qty 1

## 2024-02-08 NOTE — Plan of Care (Signed)

## 2024-02-08 NOTE — Progress Notes (Signed)
 PROGRESS NOTE    [name] [name]  FMW:[id number] DOB: [birth year] DOA: 01/11/2024 PCP: Lucius Krabbe, NP   Brief Narrative:  Patient is a 33 year old male with past medical history significant for hypertension, s/p mechanical aortic valve replacement 09/27/21 on chronic warfarin anticoagulation, recurrent pancreatitis with multiple hospitalizations, asthma, GERD, prior history of alcohol use disorder (abstinent for 10 months PTA).  Patient presented to the hospital on 01/11/2024 with complaints of abdominal pain.  Workup revealed and had episode of pancreatitis, with pseudocyst and acute kidney injury.  CT A/P done on 8/18 revealed gastric pneumatosis, general surgery was consulted, octreotide  stopped.  Patient was unable to tolerate NG tube placement, made n.p.o. again, PICC line was placed and patient was started on TPN on 8/20.  CT abdomen pelvis on 01/30/2024 revealed resolution of the pneumatosis.  General surgery team has okayed dietary advancement.  GI team is assisting in patient's management.  Calorie count is in process, as patient has lost significant amount of weight.  Patient is felt to be malnourished.  Patient does not want to go home with TPN, he would rather have J-tube placed before discharge.  General surgery is following, calorie count in progress.  Plan of J-tube based on the calorie count.    Assessment & Plan:   Principal Problem:   Acute on chronic pancreatitis [facility]) Active Problems:   Pseudocyst of pancreas   S/P AVR (aortic valve replacement) and aortoplasty   Essential hypertension   On pre-exposure prophylaxis for HIV   History of asthma   History of alcohol abuse   Protein-calorie malnutrition, severe   Intraabdominal fluid collection  Acute on chronic pancreatitis, recurrent pancreatitis Acute fluid collection/pseudocyst Gastric pneumatosis -As per chart review, EUS by Dr. Wilhelmenia in 07/2023 that showed cystic lesion at the head of the pancreas  consistent with a pseudocyst and a 5 mm stone within the pancreatic duct at the junction of the head/neck.   -Cytology of the cyst was benign.  He was scheduled for an ERCP with attempted stone extraction in September with Dr. Wilhelmenia but now admitted with abdominal pain. -[facility] was consulted, patient was treated supportively with n.p.o., IVF, core track with postpyloric feeding (initially started on 8/6).  Core track was dislodged on 8/18, briefly treated with liquid diet and core track was replaced.   -CT A/P on 8/18 showed gastric pneumatosis, general surgery was consulted, octreotide  stopped (to avoid dysmotility), patient was unable to tolerate NG tube placement, made n.p.o. again/tube feeds held, gentle IVF continuing, PICC line placed and TPN started on 01/29/2024. -Pneumatosis has resolved.  Cortrack tube has been removed.  Patient was tolerating heart healthy diet however was having intermittent nausea and vomiting.  Could not meet nutritional goals.  He did not want to go home with TPN.  General surgery consulted, they thought it would be too risky to do percutaneous GJ or J-tube.  They discussed with IR, they recommended placing NJ/postpyloric tube to help with nutrition and consider for percutaneous tube later on. Patient had his postpyloric tube placed by IR 02/06/2024, tube feeds started, TPN weaned off 02/07/2024.  Still awaiting final plans and timings of GJ J-tube by IR and general surgery.   S/p mechanical aortic valve replacement and aortoplasty -INR still subtherapeutic.  Continue bridging Lovenox  and Coumadin  dosing per pharmacy.  Continue aspirin . -Follows with Dr. Wendel, Cardiology and the Coumadin  Clinic   Essential hypertension - Blood pressure on the low side and so is the heart rate.  I  will discontinue Toprol -XL low-dose 12.5 mg that he takes at night and monitor for now.   Alcohol use disorder, in remission Stable   Asthma Stable   Anemia of Chronic  Disease Stable.   Small intestinal bacterial overgrowth (SIBO) Completed 14 days of rifaximin .  Resolved.   Nutrition Status: Nutrition Problem: Severe Malnutrition Calorie count to see progress.   Tolerating clear liquids. Continue TPN. TPN on discharge.. Body mass index is 20.57 kg/m.  DVT prophylaxis: Coumadin /Lovenox    Code Status: Full Code  Family Communication: Patient's mother present at bedside.  Plan of care discussed with patient in length and he/she verbalized understanding and agreed with it.  Status is: Inpatient Remains inpatient appropriate because: Awaiting further plans by general surgery.   Estimated body mass index is 20.51 kg/m as calculated from the following:   Height as of this encounter: 5' 11 (1.803 m).   Weight as of this encounter: 66.7 kg.    Nutritional Assessment: Body mass index is 20.51 kg/m.SABRA Seen by dietician.  I agree with the assessment and plan as outlined below: Nutrition Status: Nutrition Problem: Severe Malnutrition Etiology: vomiting, chronic illness, altered GI function Signs/Symptoms: moderate fat depletion, severe muscle depletion Interventions: Refer to RD note for recommendations  . Skin Assessment: I have examined the patient's skin and I agree with the wound assessment as performed by the wound care RN as outlined below:    Consultants:  GI and general surgery  Procedures:  As above  Antimicrobials:  Anti-infectives (From admission, onward)    Start     Dose/Rate Route Frequency Ordered Stop   01/18/24 1230  rifaximin  (XIFAXAN ) tablet 550 mg  Status:  Discontinued        550 mg Oral 3 times daily 01/18/24 1132 01/26/24 1323         Subjective: See and examined.  He says that he is doing well, although also states that he has intermittent nausea but no vomiting.  Also has mild abdominal pain.  He is just frustrated for being here for so long.  Objective: Vitals:   02/07/24 2322 02/08/24 0352 02/08/24  0500 02/08/24 0736  BP: 105/65 106/75  (!) 92/56  Pulse: 64 69  (!) 57  Resp: 17 17  17   Temp: 98.2 F (36.8 C) 97.9 F (36.6 C)  97.7 F (36.5 C)  TempSrc:    Oral  SpO2: 100% 100%  99%  Weight:   66.7 kg   Height:        Intake/Output Summary (Last 24 hours) at 02/08/2024 0756 Last data filed at 02/08/2024 0546 Gross per 24 hour  Intake 1554.35 ml  Output --  Net 1554.35 ml   Filed Weights   02/07/24 0500 02/07/24 1845 02/08/24 0500  Weight: 66.1 kg 65.7 kg 66.7 kg    Examination:  General exam: Appears calm and comfortable  Respiratory system: Clear to auscultation. Respiratory effort normal. Cardiovascular system: S1 & S2 heard, RRR. No JVD, murmurs, rubs, gallops or clicks. No pedal edema. Gastrointestinal system: Abdomen is nondistended, soft and very mild epigastric tenderness. No organomegaly or masses felt. Normal bowel sounds heard. Central nervous system: Alert and oriented. No focal neurological deficits. Extremities: Symmetric 5 x 5 power. Skin: No rashes, lesions or ulcers.  Psychiatry: Judgement and insight appear normal. Mood & affect appropriate.   Data Reviewed: I have personally reviewed following labs and imaging studies  CBC: Recent Labs  Lab 02/03/24 0500 02/04/24 0417 02/05/24 0332 02/06/24 0500 02/07/24 0344  WBC  6.3 7.3 6.1 5.7 6.7  HGB 10.7* 10.8* 10.0* 10.0* 10.1*  HCT 33.0* 33.0* 30.9* 30.3* 30.8*  MCV 87.5 87.1 88.5 87.3 87.3  PLT 308 287 270 300 299   Basic Metabolic Panel: Recent Labs  Lab 02/02/24 0551 02/03/24 0500 02/04/24 0417 02/06/24 0500  NA 138 136 135 138  K 4.2 4.0 4.0 4.0  CL 104 103 101 103  CO2 27 27 26 26   GLUCOSE 144* 133* 146* 135*  BUN 10 12 14 18   CREATININE 0.55* 0.54* 0.52* 0.53*  CALCIUM  9.0 9.0 9.1 9.0  MG 2.1 2.1  --  1.8  PHOS 4.1 4.4  --  4.4   GFR: Estimated Creatinine Clearance: 123.9 mL/min (A) (by C-G formula based on SCr of 0.53 mg/dL (L)). Liver Function Tests: Recent Labs  Lab  02/03/24 0500 02/06/24 0500  AST 29 22  ALT 34 27  ALKPHOS 134* 103  BILITOT 0.2 0.3  PROT 6.8 6.5  ALBUMIN  3.2* 3.2*   No results for input(s): LIPASE, AMYLASE in the last 168 hours. No results for input(s): AMMONIA in the last 168 hours. Coagulation Profile: Recent Labs  Lab 02/04/24 0417 02/05/24 0332 02/06/24 0500 02/07/24 0344 02/08/24 0410  INR 1.5* 1.4* 1.5* 1.6* 1.7*   Cardiac Enzymes: No results for input(s): CKTOTAL, CKMB, CKMBINDEX, TROPONINI in the last 168 hours. BNP (last 3 results) No results for input(s): PROBNP in the last 8760 hours. HbA1C: No results for input(s): HGBA1C in the last 72 hours. CBG: Recent Labs  Lab 02/07/24 0837 02/07/24 1203 02/07/24 1537 02/07/24 2323 02/08/24 0635  GLUCAP 120* 118* 128* 114* 141*   Lipid Profile: No results for input(s): CHOL, HDL, LDLCALC, TRIG, CHOLHDL, LDLDIRECT in the last 72 hours.  Thyroid Function Tests: No results for input(s): TSH, T4TOTAL, FREET4, T3FREE, THYROIDAB in the last 72 hours. Anemia Panel: No results for input(s): VITAMINB12, FOLATE, FERRITIN, TIBC, IRON, RETICCTPCT in the last 72 hours. Sepsis Labs: No results for input(s): PROCALCITON, LATICACIDVEN in the last 168 hours.  No results found for this or any previous visit (from the past 240 hours).   Radiology Studies: DG Abd 1 View Result Date: 02/06/2024 CLINICAL DATA:  [id number] Encounter for feeding tube placement [phone]. EXAM: ABDOMEN - 1 VIEW COMPARISON:  01/27/2024. FINDINGS: The bowel gas pattern is non-obstructive. No evidence of pneumoperitoneum, within the limitations of a supine film. No acute osseous abnormalities. The soft tissues are within normal limits. Surgical changes, devices, tubes and lines: There is retrograde opacification of the proximal small bowel loop, likely proximal duodenum with positive contrast. There is no opacification of stomach. No frank extravasation  of contrast outside the presumed confines of the bowel, to suggest frank leak. IMPRESSION: Nonobstructive bowel gas pattern. There is retrograde opacification of the proximal small bowel loop, likely proximal duodenum with positive contrast. There is no opacification of stomach. No frank extravasation of contrast outside the presumed confines of the bowel, to suggest frank leak. Electronically Signed   By: Ree Molt M.D.   On: 02/06/2024 08:07    Scheduled Meds:  aspirin  EC  81 mg Oral Daily   bisacodyl   10 mg Rectal Once   Chlorhexidine  Gluconate Cloth  6 each Topical Daily   free water   25 mL Per Tube Q1H   insulin  aspart  0-9 Units Subcutaneous Q6H   lipase/protease/amylase  72,000 Units Oral TID AC   magnesium  citrate  1 Bottle Oral Once   magnesium  hydroxide  30 mL Oral Once  methylnaltrexone   12 mg Subcutaneous Once   metoprolol  succinate  12.5 mg Oral QHS   pantoprazole  (PROTONIX ) IV  40 mg Intravenous Q12H   polyethylene glycol  17 g Oral TID   senna  2 tablet Oral Once   sodium chloride  flush  3 mL Intravenous Q12H   warfarin  6 mg Oral q1600   Warfarin - Pharmacist Dosing Inpatient   Does not apply q1600   Continuous Infusions:  feeding supplement (OSMOLITE 1.5 CAL) 70 mL/hr at 02/07/24 1602     LOS: 28 days   Fredia Skeeter, MD [facility]  02/08/2024, 7:56 AM   *Please note that this is a verbal dictation therefore any spelling or grammatical errors are due to the Dragon Medical One system interpretation.  Please page via Amion and do not message via secure chat for urgent patient care matters. Secure chat can be used for non urgent patient care matters.  How to contact the [facility] Attending or Consulting provider 7A - 7P or covering provider during after hours 7P -7A, for this patient?  Check the care team in [facility] and look for a) attending/consulting TRH provider listed and b) the TRH team listed. Page or secure chat 7A-7P. Log into [email or web address] and use [facility]'s universal password to access. If you do not have the password, please contact the hospital operator. Locate the TR[facility] provider you are looking for under [facility] and page to a number that you can be directly reached. If you still have difficulty reaching the provider, please page the [facility] (Director on Call) for the Hospitalists listed on amion for assistance.

## 2024-02-09 DIAGNOSIS — K861 Other chronic pancreatitis: Secondary | ICD-10-CM | POA: Diagnosis not present

## 2024-02-09 DIAGNOSIS — K859 Acute pancreatitis without necrosis or infection, unspecified: Secondary | ICD-10-CM | POA: Diagnosis not present

## 2024-02-09 LAB — GLUCOSE, CAPILLARY
Glucose-Capillary: 108 mg/dL — ABNORMAL HIGH (ref 70–99)
Glucose-Capillary: 111 mg/dL — ABNORMAL HIGH (ref 70–99)
Glucose-Capillary: 124 mg/dL — ABNORMAL HIGH (ref 70–99)
Glucose-Capillary: 127 mg/dL — ABNORMAL HIGH (ref 70–99)

## 2024-02-09 LAB — PROTIME-INR
INR: 1.8 — ABNORMAL HIGH (ref 0.8–1.2)
Prothrombin Time: 22.1 s — ABNORMAL HIGH (ref 11.4–15.2)

## 2024-02-09 NOTE — Progress Notes (Signed)
 PROGRESS NOTE    [name] [name]  FMW:[id number] DOB: [birth year] DOA: 01/11/2024 PCP: Lucius Krabbe, NP   Brief Narrative:  Patient is a 33 year old male with past medical history significant for hypertension, s/p mechanical aortic valve replacement 09/27/21 on chronic warfarin anticoagulation, recurrent pancreatitis with multiple hospitalizations, asthma, GERD, prior history of alcohol use disorder (abstinent for 10 months PTA).  Patient presented to the hospital on 01/11/2024 with complaints of abdominal pain.  Workup revealed and had episode of pancreatitis, with pseudocyst and acute kidney injury.  CT A/P done on 8/18 revealed gastric pneumatosis, general surgery was consulted, octreotide  stopped.  Patient was unable to tolerate NG tube placement, made n.p.o. again, PICC line was placed and patient was started on TPN on 8/20.  CT abdomen pelvis on 01/30/2024 revealed resolution of the pneumatosis.  General surgery team has okayed dietary advancement.  GI team is assisting in patient's management.  Calorie count is in process, as patient has lost significant amount of weight.  Patient is felt to be malnourished.  Patient does not want to go home with TPN, he would rather have J-tube placed before discharge.  General surgery is following, calorie count in progress.  Plan of J-tube based on the calorie count.    Assessment & Plan:   Principal Problem:   Acute on chronic pancreatitis [facility]) Active Problems:   Pseudocyst of pancreas   S/P AVR (aortic valve replacement) and aortoplasty   Essential hypertension   On pre-exposure prophylaxis for HIV   History of asthma   History of alcohol abuse   Protein-calorie malnutrition, severe   Intraabdominal fluid collection  Acute on chronic pancreatitis, recurrent pancreatitis Acute fluid collection/pseudocyst Gastric pneumatosis -As per chart review, EUS by Dr. Wilhelmenia in 07/2023 that showed cystic lesion at the head of the pancreas  consistent with a pseudocyst and a 5 mm stone within the pancreatic duct at the junction of the head/neck.   -Cytology of the cyst was benign.  He was scheduled for an ERCP with attempted stone extraction in September with Dr. Wilhelmenia but now admitted with abdominal pain. -[facility] was consulted, patient was treated supportively with n.p.o., IVF, core track with postpyloric feeding (initially started on 8/6).  Core track was dislodged on 8/18, briefly treated with liquid diet and core track was replaced.   -CT A/P on 8/18 showed gastric pneumatosis, general surgery was consulted, octreotide  stopped (to avoid dysmotility), patient was unable to tolerate NG tube placement, made n.p.o. again/tube feeds held, gentle IVF continuing, PICC line placed and TPN started on 01/29/2024. -Pneumatosis has resolved.  Cortrack tube has been removed.  Patient was tolerating heart healthy diet however was having intermittent nausea and vomiting.  Could not meet nutritional goals.  He did not want to go home with TPN.  General surgery consulted, they thought it would be too risky to do percutaneous GJ or J-tube.  They discussed with IR, they recommended placing NJ/postpyloric tube to help with nutrition and consider for percutaneous tube later on. Patient had his postpyloric tube placed by IR 02/06/2024, tube feeds started, TPN weaned off 02/07/2024.  Tolerating tube feeds very well.  Still awaiting final plans and timings of GJ J-tube by IR and general surgery.   S/p mechanical aortic valve replacement and aortoplasty -INR still subtherapeutic.  Continue bridging Lovenox  and Coumadin  dosing per pharmacy.  Continue aspirin . -Follows with Dr. Wendel, Cardiology and the Coumadin  Clinic   Essential hypertension - Blood pressure on the low side and so  is the heart rate.  I will discontinue Toprol -XL low-dose 12.5 mg that he takes at night and monitor for now.   Alcohol use disorder, in remission Stable    Asthma Stable   Anemia of Chronic Disease Stable.   Small intestinal bacterial overgrowth (SIBO) Completed 14 days of rifaximin .  Resolved.   Nutrition Status: Nutrition Problem: Severe Malnutrition Calorie count to see progress.   Tolerating clear liquids. Continue TPN. TPN on discharge.. Body mass index is 20.57 kg/m.  DVT prophylaxis: Coumadin /Lovenox    Code Status: Full Code  Family Communication: Patient's mother present at bedside.  Plan of care discussed with patient in length and he/she verbalized understanding and agreed with it.  Status is: Inpatient Remains inpatient appropriate because: Awaiting further plans by general surgery.   Estimated body mass index is 20.51 kg/m as calculated from the following:   Height as of this encounter: 5' 11 (1.803 m).   Weight as of this encounter: 66.7 kg.    Nutritional Assessment: Body mass index is 20.51 kg/m.SABRA Seen by dietician.  I agree with the assessment and plan as outlined below: Nutrition Status: Nutrition Problem: Severe Malnutrition Etiology: vomiting, chronic illness, altered GI function Signs/Symptoms: moderate fat depletion, severe muscle depletion Interventions: Refer to RD note for recommendations  . Skin Assessment: I have examined the patient's skin and I agree with the wound assessment as performed by the wound care RN as outlined below:    Consultants:  GI and general surgery  Procedures:  As above  Antimicrobials:  Anti-infectives (From admission, onward)    Start     Dose/Rate Route Frequency Ordered Stop   01/18/24 1230  rifaximin  (XIFAXAN ) tablet 550 mg  Status:  Discontinued        550 mg Oral 3 times daily 01/18/24 1132 01/26/24 1323         Subjective: Patient seen and examined.  He has no new complaint today.  He is just frustrated for being in the hospital for so long.  Objective: Vitals:   02/09/24 0025 02/09/24 0441 02/09/24 0441 02/09/24 0500  BP: 107/71 98/67 98/67     Pulse: 63 (!) 58 (!) 58   Resp: 17 18    Temp: 98.1 F (36.7 C) 99.1 F (37.3 C) 99.1 F (37.3 C)   TempSrc: Oral Oral    SpO2: 100% 99% 99%   Weight:    66.7 kg  Height:        Intake/Output Summary (Last 24 hours) at 02/09/2024 0736 Last data filed at 02/09/2024 0604 Gross per 24 hour  Intake 575 ml  Output --  Net 575 ml   Filed Weights   02/07/24 1845 02/08/24 0500 02/09/24 0500  Weight: 65.7 kg 66.7 kg 66.7 kg    Examination:  General exam: Appears calm and comfortable  Respiratory system: Clear to auscultation. Respiratory effort normal. Cardiovascular system: S1 & S2 heard, RRR. No JVD, murmurs, rubs, gallops or clicks. No pedal edema. Gastrointestinal system: Abdomen is nondistended, soft and nontender. No organomegaly or masses felt. Normal bowel sounds heard. Central nervous system: Alert and oriented. No focal neurological deficits. Extremities: Symmetric 5 x 5 power. Skin: No rashes, lesions or ulcers.  Psychiatry: Judgement and insight appear normal. Mood & affect appropriate.   Data Reviewed: I have personally reviewed following labs and imaging studies  CBC: Recent Labs  Lab 02/03/24 0500 02/04/24 0417 02/05/24 0332 02/06/24 0500 02/07/24 0344  WBC 6.3 7.3 6.1 5.7 6.7  HGB 10.7* 10.8* 10.0* 10.0* 10.1*  HCT 33.0* 33.0* 30.9* 30.3* 30.8*  MCV 87.5 87.1 88.5 87.3 87.3  PLT 308 287 270 300 299   Basic Metabolic Panel: Recent Labs  Lab 02/03/24 0500 02/04/24 0417 02/06/24 0500  NA 136 135 138  K 4.0 4.0 4.0  CL 103 101 103  CO2 27 26 26   GLUCOSE 133* 146* 135*  BUN 12 14 18   CREATININE 0.54* 0.52* 0.53*  CALCIUM  9.0 9.1 9.0  MG 2.1  --  1.8  PHOS 4.4  --  4.4   GFR: Estimated Creatinine Clearance: 123.9 mL/min (A) (by C-G formula based on SCr of 0.53 mg/dL (L)). Liver Function Tests: Recent Labs  Lab 02/03/24 0500 02/06/24 0500  AST 29 22  ALT 34 27  ALKPHOS 134* 103  BILITOT 0.2 0.3  PROT 6.8 6.5  ALBUMIN  3.2* 3.2*   No  results for input(s): LIPASE, AMYLASE in the last 168 hours. No results for input(s): AMMONIA in the last 168 hours. Coagulation Profile: Recent Labs  Lab 02/05/24 0332 02/06/24 0500 02/07/24 0344 02/08/24 0410 02/09/24 0314  INR 1.4* 1.5* 1.6* 1.7* 1.8*   Cardiac Enzymes: No results for input(s): CKTOTAL, CKMB, CKMBINDEX, TROPONINI in the last 168 hours. BNP (last 3 results) No results for input(s): PROBNP in the last 8760 hours. HbA1C: No results for input(s): HGBA1C in the last 72 hours. CBG: Recent Labs  Lab 02/08/24 1218 02/08/24 1832 02/08/24 2122 02/09/24 0025 02/09/24 0441  GLUCAP 103* 139* 130* 127* 111*   Lipid Profile: No results for input(s): CHOL, HDL, LDLCALC, TRIG, CHOLHDL, LDLDIRECT in the last 72 hours.  Thyroid Function Tests: No results for input(s): TSH, T4TOTAL, FREET4, T3FREE, THYROIDAB in the last 72 hours. Anemia Panel: No results for input(s): VITAMINB12, FOLATE, FERRITIN, TIBC, IRON, RETICCTPCT in the last 72 hours. Sepsis Labs: No results for input(s): PROCALCITON, LATICACIDVEN in the last 168 hours.  No results found for this or any previous visit (from the past 240 hours).   Radiology Studies: No results found.   Scheduled Meds:  aspirin  EC  81 mg Oral Daily   bisacodyl   10 mg Rectal Once   Chlorhexidine  Gluconate Cloth  6 each Topical Daily   free water   25 mL Per Tube Q1H   insulin  aspart  0-9 Units Subcutaneous Q6H   lipase/protease/amylase  72,000 Units Oral TID AC   magnesium  citrate  1 Bottle Oral Once   magnesium  hydroxide  30 mL Oral Once   methylnaltrexone   12 mg Subcutaneous Once   pantoprazole  (PROTONIX ) IV  40 mg Intravenous Q12H   polyethylene glycol  17 g Oral TID   senna  2 tablet Oral Once   sodium chloride  flush  3 mL Intravenous Q12H   warfarin  6 mg Oral q1600   Warfarin - Pharmacist Dosing Inpatient   Does not apply q1600   Continuous Infusions:   feeding supplement (OSMOLITE 1.5 CAL) 70 mL/hr at 02/07/24 1602     LOS: 29 days   Fredia Skeeter, MD [facility]  02/09/2024, 7:36 AM   *Please note that this is a verbal dictation therefore any spelling or grammatical errors are due to the Dragon Medical One system interpretation.  Please page via Amion and do not message via secure chat for urgent patient care matters. Secure chat can be used for non urgent patient care matters.  How to contact the [facility] Attending or Consulting provider 7A - 7P or covering provider during after hours 7P -7A, for this patient?  Check the care  team in [facility] and look for a) attending/consulting TRH provider listed and b) the TRH team listed. Page or secure chat 7A-7P. Log into [email or web address] and use [place]'s universal password to access. If you do not have the password, please contact the hospital operator. Locate the [facility] provider you are looking for under [facility] and page to a number that you can be directly reached. If you still have difficulty reaching the provider, please page the [facility] (Director on Call) for the Hospitalists listed on amion for assistance.

## 2024-02-09 NOTE — Progress Notes (Signed)
 PHARMACY - ANTICOAGULATION CONSULT NOTE  Pharmacy Consult for Warfarin Indication: On-X mechanical AVR  Allergies  Allergen Reactions   Amoxicillin Hives   Patient Measurements: Height: 5' 11 (180.3 cm) Weight: 66.7 kg (147 lb 0.8 oz) IBW/kg (Calculated) : 75.3 HEPARIN  DW (KG): 71  Vital Signs: Temp: 97.9 F (36.6 C) (08/31 0851) Temp Source: Oral (08/31 0851) BP: 117/67 (08/31 0851) Pulse Rate: 63 (08/31 0851)  Labs: Recent Labs    02/07/24 0344 02/08/24 0410 02/09/24 0314  HGB 10.1*  --   --   HCT 30.8*  --   --   PLT 299  --   --   LABPROT 19.9* 20.4* 22.1*  INR 1.6* 1.7* 1.8*   Estimated Creatinine Clearance: 123.9 mL/min (A) (by C-G formula based on SCr of 0.53 mg/dL (L)).  Assessment: 33 yo M presenting with pancreatitis. Warfarin PTA regimen is 6 mg Monday and Thursday and 4.5 mg all other days (total weekly dose: 34.5 mg).  It has been difficult to maintain INR within the narrow 1.5-2 goal range, especially in previous days due to issues with enteral intake.   INR therapeutic at 1.8; minimal to no PO intake.  Goal of Therapy:  INR 1.5-2.0 Monitor platelets by anticoagulation protocol: Yes   Plan:  Continue Warfarin 6 mg PO daily  Check INR daily while on warfarin - consider reducing if stable Monitor for s/sx of bleeding  Sutter Ahlgren D. Lendell, PharmD, BCPS, BCCCP 02/09/2024, 1:12 PM

## 2024-02-09 NOTE — Plan of Care (Signed)

## 2024-02-09 NOTE — Plan of Care (Signed)
   Problem: Education: Goal: Knowledge of General Education information will improve Description: Including pain rating scale, medication(s)/side effects and non-pharmacologic comfort measures Outcome: Progressing   Problem: Health Behavior/Discharge Planning: Goal: Ability to manage health-related needs will improve Outcome: Progressing   Problem: Activity: Goal: Risk for activity intolerance will decrease Outcome: Progressing

## 2024-02-10 DIAGNOSIS — K861 Other chronic pancreatitis: Secondary | ICD-10-CM | POA: Diagnosis not present

## 2024-02-10 DIAGNOSIS — K859 Acute pancreatitis without necrosis or infection, unspecified: Secondary | ICD-10-CM | POA: Diagnosis not present

## 2024-02-10 LAB — GLUCOSE, CAPILLARY
Glucose-Capillary: 114 mg/dL — ABNORMAL HIGH (ref 70–99)
Glucose-Capillary: 120 mg/dL — ABNORMAL HIGH (ref 70–99)
Glucose-Capillary: 124 mg/dL — ABNORMAL HIGH (ref 70–99)
Glucose-Capillary: 128 mg/dL — ABNORMAL HIGH (ref 70–99)

## 2024-02-10 LAB — PROTIME-INR
INR: 2 — ABNORMAL HIGH (ref 0.8–1.2)
Prothrombin Time: 23.2 s — ABNORMAL HIGH (ref 11.4–15.2)

## 2024-02-10 MED ORDER — WARFARIN SODIUM 4 MG PO TABS
4.5000 mg | ORAL_TABLET | Freq: Once | ORAL | Status: AC
  Administered 2024-02-10: 4.5 mg via ORAL
  Filled 2024-02-10: qty 1

## 2024-02-10 MED ORDER — WARFARIN SODIUM 6 MG PO TABS: 6.0000 mg | ORAL_TABLET | Freq: Every day | ORAL | Status: DC

## 2024-02-10 NOTE — Plan of Care (Signed)
  Problem: Education: Goal: Knowledge of General Education information will improve Description: Including pain rating scale, medication(s)/side effects and non-pharmacologic comfort measures Outcome: Progressing   Problem: Activity: Goal: Risk for activity intolerance will decrease Outcome: Progressing   Problem: Pain Managment: Goal: General experience of comfort will improve and/or be controlled Outcome: Progressing

## 2024-02-10 NOTE — Progress Notes (Signed)
 PROGRESS NOTE    [name] [name]  FMW:[id number] DOB: 1991/06/05 DOA: 01/11/2024 PCP: Lucius Krabbe, NP   Brief Narrative:  Patient is a 33 year old male with past medical history significant for hypertension, s/p mechanical aortic valve replacement 09/27/21 on chronic warfarin anticoagulation, recurrent pancreatitis with multiple hospitalizations, asthma, GERD, prior history of alcohol use disorder (abstinent for 10 months PTA).  Patient presented to the hospital on 01/11/2024 with complaints of abdominal pain.  Workup revealed and had episode of pancreatitis, with pseudocyst and acute kidney injury.  CT A/P done on 8/18 revealed gastric pneumatosis, general surgery was consulted, octreotide  stopped.  Patient was unable to tolerate NG tube placement, made n.p.o. again, PICC line was placed and patient was started on TPN on 8/20.  CT abdomen pelvis on 01/30/2024 revealed resolution of the pneumatosis.  General surgery team has okayed dietary advancement.  GI team is assisting in patient's management.  Calorie count is in process, as patient has lost significant amount of weight.  Patient is felt to be malnourished.  Patient does not want to go home with TPN, he would rather have J-tube placed before discharge.  General surgery is following, calorie count in progress.  Plan of J-tube based on the calorie count.    Assessment & Plan:   Principal Problem:   Acute on chronic pancreatitis [facility]) Active Problems:   Pseudocyst of pancreas   S/P AVR (aortic valve replacement) and aortoplasty   Essential hypertension   On pre-exposure prophylaxis for HIV   History of asthma   History of alcohol abuse   Protein-calorie malnutrition, severe   Intraabdominal fluid collection  Acute on chronic pancreatitis, recurrent pancreatitis Acute fluid collection/pseudocyst Gastric pneumatosis -As per chart review, EUS by Dr. Wilhelmenia in 07/2023 that showed cystic lesion at the head of the pancreas  consistent with a pseudocyst and a 5 mm stone within the pancreatic duct at the junction of the head/neck.   -Cytology of the cyst was benign.  He was scheduled for an ERCP with attempted stone extraction in September with Dr. Wilhelmenia but now admitted with abdominal pain. -[facility] was consulted, patient was treated supportively with n.p.o., IVF, core track with postpyloric feeding (initially started on 8/6).  Core track was dislodged on 8/18, briefly treated with liquid diet and core track was replaced.   -CT A/P on 8/18 showed gastric pneumatosis, general surgery was consulted, octreotide  stopped (to avoid dysmotility), patient was unable to tolerate NG tube placement, made n.p.o. again/tube feeds held, gentle IVF continuing, PICC line placed and TPN started on 01/29/2024. -Pneumatosis has resolved.  Cortrack tube has been removed.  Patient was tolerating heart healthy diet however was having intermittent nausea and vomiting.  Could not meet nutritional goals.  He did not want to go home with TPN.  General surgery consulted, they thought it would be too risky to do percutaneous GJ or J-tube.  They discussed with IR, they recommended placing NJ/postpyloric tube to help with nutrition and consider for percutaneous tube later on. Patient had his postpyloric tube placed by IR 02/06/2024, tube feeds started, TPN weaned off 02/07/2024.  Tolerating tube feeds very well.  Still awaiting for final plans and timings of GJ J-tube by IR and general surgery.   S/p mechanical aortic valve replacement and aortoplasty -INR therapeutic.  Patient's goal INR is 1.5-2.  Continue bridging Lovenox  and Coumadin  dosing per pharmacy.  Continue aspirin . -Follows with Dr. Wendel, Cardiology and the Coumadin  Clinic   Essential hypertension - Blood pressure  on the low side and so is the heart rate.  I will discontinue Toprol -XL low-dose 12.5 mg that he takes at night and monitor for now.   Alcohol use disorder, in  remission Stable   Asthma Stable   Anemia of Chronic Disease Stable.   Small intestinal bacterial overgrowth (SIBO) Completed 14 days of rifaximin .  Resolved.   Nutrition Status: Nutrition Problem: Severe Malnutrition Calorie count to see progress.   Tolerating clear liquids. Continue TPN. TPN on discharge.. Body mass index is 20.57 kg/m.  DVT prophylaxis: Coumadin /Lovenox    Code Status: Full Code  Family Communication: None present at bedside.  Plan of care discussed with patient in length and he/she verbalized understanding and agreed with it.  Status is: Inpatient Remains inpatient appropriate because: Awaiting further plans by general surgery.   Estimated body mass index is 20.51 kg/m as calculated from the following:   Height as of this encounter: 5' 11 (1.803 m).   Weight as of this encounter: 66.7 kg.    Nutritional Assessment: Body mass index is 20.51 kg/m.SABRA Seen by dietician.  I agree with the assessment and plan as outlined below: Nutrition Status: Nutrition Problem: Severe Malnutrition Etiology: vomiting, chronic illness, altered GI function Signs/Symptoms: moderate fat depletion, severe muscle depletion Interventions: Refer to RD note for recommendations  . Skin Assessment: I have examined the patient's skin and I agree with the wound assessment as performed by the wound care RN as outlined below:    Consultants:  GI and general surgery  Procedures:  As above  Antimicrobials:  Anti-infectives (From admission, onward)    Start     Dose/Rate Route Frequency Ordered Stop   01/18/24 1230  rifaximin  (XIFAXAN ) tablet 550 mg  Status:  Discontinued        550 mg Oral 3 times daily 01/18/24 1132 01/26/24 1323         Subjective: Seen and examined.  No complaints today other than intermittent epigastric mild abdominal pain.  Objective: Vitals:   02/10/24 0334 02/10/24 0406 02/10/24 0500 02/10/24 0750  BP: 116/74 122/79  131/85  Pulse: 88 61   69  Resp:    17  Temp:  97.9 F (36.6 C)  (!) 97.5 F (36.4 C)  TempSrc:      SpO2: 100% 99%  100%  Weight:   66.7 kg   Height:        Intake/Output Summary (Last 24 hours) at 02/10/2024 0814 Last data filed at 02/10/2024 9392 Gross per 24 hour  Intake 720 ml  Output --  Net 720 ml   Filed Weights   02/08/24 0500 02/09/24 0500 02/10/24 0500  Weight: 66.7 kg 66.7 kg 66.7 kg    Examination:  General exam: Appears calm and comfortable  Respiratory system: Clear to auscultation. Respiratory effort normal. Cardiovascular system: S1 & S2 heard, RRR. No JVD, murmurs, rubs, gallops or clicks. No pedal edema. Gastrointestinal system: Abdomen is nondistended, soft and nontender. No organomegaly or masses felt. Normal bowel sounds heard. Central nervous system: Alert and oriented. No focal neurological deficits. Extremities: Symmetric 5 x 5 power. Skin: No rashes, lesions or ulcers.  Psychiatry: Judgement and insight appear normal. Mood & affect appropriate.   Data Reviewed: I have personally reviewed following labs and imaging studies  CBC: Recent Labs  Lab 02/04/24 0417 02/05/24 0332 02/06/24 0500 02/07/24 0344  WBC 7.3 6.1 5.7 6.7  HGB 10.8* 10.0* 10.0* 10.1*  HCT 33.0* 30.9* 30.3* 30.8*  MCV 87.1 88.5 87.3 87.3  PLT 287 270 300 299   Basic Metabolic Panel: Recent Labs  Lab 02/04/24 0417 02/06/24 0500  NA 135 138  K 4.0 4.0  CL 101 103  CO2 26 26  GLUCOSE 146* 135*  BUN 14 18  CREATININE 0.52* 0.53*  CALCIUM  9.1 9.0  MG  --  1.8  PHOS  --  4.4   GFR: Estimated Creatinine Clearance: 123.9 mL/min (A) (by C-G formula based on SCr of 0.53 mg/dL (L)). Liver Function Tests: Recent Labs  Lab 02/06/24 0500  AST 22  ALT 27  ALKPHOS 103  BILITOT 0.3  PROT 6.5  ALBUMIN  3.2*   No results for input(s): LIPASE, AMYLASE in the last 168 hours. No results for input(s): AMMONIA in the last 168 hours. Coagulation Profile: Recent Labs  Lab 02/06/24 0500  02/07/24 0344 02/08/24 0410 02/09/24 0314 02/10/24 0310  INR 1.5* 1.6* 1.7* 1.8* 2.0*   Cardiac Enzymes: No results for input(s): CKTOTAL, CKMB, CKMBINDEX, TROPONINI in the last 168 hours. BNP (last 3 results) No results for input(s): PROBNP in the last 8760 hours. HbA1C: No results for input(s): HGBA1C in the last 72 hours. CBG: Recent Labs  Lab 02/09/24 0441 02/09/24 1200 02/09/24 1825 02/10/24 0024 02/10/24 0512  GLUCAP 111* 124* 108* 128* 114*   Lipid Profile: No results for input(s): CHOL, HDL, LDLCALC, TRIG, CHOLHDL, LDLDIRECT in the last 72 hours.  Thyroid Function Tests: No results for input(s): TSH, T4TOTAL, FREET4, T3FREE, THYROIDAB in the last 72 hours. Anemia Panel: No results for input(s): VITAMINB12, FOLATE, FERRITIN, TIBC, IRON, RETICCTPCT in the last 72 hours. Sepsis Labs: No results for input(s): PROCALCITON, LATICACIDVEN in the last 168 hours.  No results found for this or any previous visit (from the past 240 hours).   Radiology Studies: No results found.   Scheduled Meds:  aspirin  EC  81 mg Oral Daily   Chlorhexidine  Gluconate Cloth  6 each Topical Daily   free water   25 mL Per Tube Q1H   insulin  aspart  0-9 Units Subcutaneous Q6H   lipase/protease/amylase  72,000 Units Oral TID AC   pantoprazole  (PROTONIX ) IV  40 mg Intravenous Q12H   polyethylene glycol  17 g Oral TID   sodium chloride  flush  3 mL Intravenous Q12H   warfarin  6 mg Oral q1600   Warfarin - Pharmacist Dosing Inpatient   Does not apply q1600   Continuous Infusions:  feeding supplement (OSMOLITE 1.5 CAL) 70 mL/hr at 02/07/24 1602     LOS: 30 days   Fredia Skeeter, MD [facility]  02/10/2024, 8:14 AM   *Please note that this is a verbal dictation therefore any spelling or grammatical errors are due to the Dragon Medical One system interpretation.  Please page via Amion and do not message via secure chat for urgent  patient care matters. Secure chat can be used for non urgent patient care matters.  How to contact the [facility] Attending or Consulting provider 7A - 7P or covering provider during after hours 7P -7A, for this patient?  Check the care team in [facility] [address] and look for a) attending/consulting [facility] provider listed and b) the [facility] team listed. Page or secure chat 7A-7P. Log into [email or web address] and use [place]'s universal password to access. If you do not have the password, please contact the hospital operator. Locate the [facility] provider you are looking for under [facility] and page to a number that you can be directly reached. If you still have difficulty reaching the provider, please page  the [facility] (Director on Call) for the Hospitalists listed on amion for assistance.

## 2024-02-10 NOTE — Progress Notes (Addendum)
 PHARMACY - ANTICOAGULATION CONSULT NOTE  Pharmacy Consult for Warfarin Indication: On-X mechanical AVR  Allergies  Allergen Reactions   Amoxicillin Hives   Patient Measurements: Height: 5' 11 (180.3 cm) Weight: 66.7 kg (147 lb 0.8 oz) IBW/kg (Calculated) : 75.3 HEPARIN  DW (KG): 71  Vital Signs: Temp: 98.2 F (36.8 C) (09/01 1146) BP: 107/82 (09/01 1146) Pulse Rate: 82 (09/01 1146)  Labs: Recent Labs    02/08/24 0410 02/09/24 0314 02/10/24 0310  LABPROT 20.4* 22.1* 23.2*  INR 1.7* 1.8* 2.0*   Estimated Creatinine Clearance: 123.9 mL/min (A) (by C-G formula based on SCr of 0.53 mg/dL (L)).  Assessment: 33 yo M presenting with pancreatitis. Warfarin PTA regimen is 6 mg Monday and Thursday and 4.5 mg all other days (total weekly dose: 34.5 mg).  It has been difficult to maintain INR within the narrow 1.5-2 goal range, especially in previous days due to issues with enteral intake.   INR at upper end of therapeutic at 2.0. INR has been steadily increasing at 6 mg daily (1.6 >1.7 >1.8 >2.0); minimal to no PO intake. Will decrease dose slightly today given narrow window to avoid being supratherapeutic. Reassess tomorrow. CBC stable  Goal of Therapy:  INR 1.5-2.0 Monitor platelets by anticoagulation protocol: Yes   Plan:  Warfarin 4.5 mg x1 at 1600  Check INR daily while on warfarin - consider reducing if stable Monitor for s/sx of bleeding  Elma Fail, PharmD PGY1 Clinical Pharmacist [facility]  02/10/2024 12:34 PM

## 2024-02-11 DIAGNOSIS — K861 Other chronic pancreatitis: Secondary | ICD-10-CM | POA: Diagnosis not present

## 2024-02-11 DIAGNOSIS — K859 Acute pancreatitis without necrosis or infection, unspecified: Secondary | ICD-10-CM | POA: Diagnosis not present

## 2024-02-11 LAB — GLUCOSE, CAPILLARY
Glucose-Capillary: 118 mg/dL — ABNORMAL HIGH (ref 70–99)
Glucose-Capillary: 131 mg/dL — ABNORMAL HIGH (ref 70–99)
Glucose-Capillary: 132 mg/dL — ABNORMAL HIGH (ref 70–99)
Glucose-Capillary: 142 mg/dL — ABNORMAL HIGH (ref 70–99)

## 2024-02-11 LAB — PROTIME-INR
INR: 2 — ABNORMAL HIGH (ref 0.8–1.2)
Prothrombin Time: 23.8 s — ABNORMAL HIGH (ref 11.4–15.2)

## 2024-02-11 MED ORDER — WARFARIN SODIUM 4 MG PO TABS: 4.5000 mg | ORAL_TABLET | Freq: Once | ORAL | Status: DC

## 2024-02-11 NOTE — Plan of Care (Incomplete)

## 2024-02-11 NOTE — Progress Notes (Signed)
 Nutrition Follow-up  DOCUMENTATION CODES:   Severe malnutrition in context of chronic illness  INTERVENTION:  -Osmolite 1.5 continuous @ goal rate of 70 ml/hr via NJ Cortrak. FWF 25 ml q-1 At goal this provides 2520 kcal, 105 g pro, 1277 ml  Fluid total of 1877 ml fluid   -Daily weights standing as able   -Continue to strongly recommend PEJ or PEG/J tube placement and post-pyloric EN as most appropriate nutrition support intervention. Hopeful pt receives tomorrow   NUTRITION DIAGNOSIS:   Severe Malnutrition related to vomiting, chronic illness, altered GI function as evidenced by moderate fat depletion, severe muscle depletion.  Ongoing  GOAL:   Patient will meet greater than or equal to 90% of their needs  Met with EN  MONITOR:   PO intake, Weight trends, TF tolerance, Supplement acceptance, Diet advancement, Labs, Skin  REASON FOR ASSESSMENT:   New TF, Diagnosis    ASSESSMENT:   Hx hypertension, bicuspid aortic valve with severe aortic insufficiency and moderate aortic stenosis s/p mechanical aortic valve replacement in arthroplasty on 09/27/2021 on chronic anticoagulation, recurrent pancreatitis, asthma, GERD, and prior history of alcohol abuse presents with abdominal pain.  Spoke to pt and mother in room. Pt feeling fine this morning, has intermittent abdominal/lower back pain. He is receiving Osmolite 1.5 @ goal rate 70 ml/hr with no s/sx of EN intolerance. Last BM 8/31. Pt has not had any PO intake in days r/t pt states he is paranoid that eating will cause him to have to stay in hospital for longer. Encouraged Pt to start slow with liquids and see how he tolerates. Reassured pt/mother than EN is providing pt with all nutrients needed. Pt's weight has been stable; appears bed weights are consistently ~1 kg above standing weights. Continue to daily standing weights as able.  Long discussion regarding pancreatitis, upcoming procedure for stent, PO intake  recommendations, hope to get PEG/J soon. Answered all of mother's and pt's questions at this time, will continue to monitor, RDN available prn. Encouraged pt/mother to follow up with this RD with any questions prn.   Labs H/H 10.1/30.8 BG 114-131  Medications  aspirin  EC  81 mg Oral Daily   Chlorhexidine  Gluconate Cloth  6 each Topical Daily   free water   25 mL Per Tube Q1H   insulin  aspart  0-9 Units Subcutaneous Q6H   lipase/protease/amylase  72,000 Units Oral TID AC   pantoprazole  (PROTONIX ) IV  40 mg Intravenous Q12H   polyethylene glycol  17 g Oral TID   sodium chloride  flush  3 mL Intravenous Q12H   warfarin  4.5 mg Oral ONCE-1600   Warfarin - Pharmacist Dosing Inpatient   Does not apply q1600     NUTRITION - FOCUSED PHYSICAL EXAM:  Flowsheet Row Most Recent Value  Orbital Region Moderate depletion  Upper Arm Region Moderate depletion  Thoracic and Lumbar Region Moderate depletion  Buccal Region Moderate depletion  Temple Region Moderate depletion  Clavicle Bone Region Severe depletion  Clavicle and Acromion Bone Region Severe depletion  Scapular Bone Region Moderate depletion  Dorsal Hand Moderate depletion  Patellar Region Moderate depletion  Anterior Thigh Region Moderate depletion  Posterior Calf Region Moderate depletion  Edema (RD Assessment) None  Hair Reviewed  Eyes Reviewed  Mouth Reviewed  Skin Reviewed  Nails Reviewed    Diet Order:   Diet Order             Diet Heart Fluid consistency: Thin  Diet effective now  EDUCATION NEEDS:   Education needs have been addressed  Skin:  Skin Assessment: Reviewed RN Assessment  Last BM:  8/31  Height:   Ht Readings from Last 1 Encounters:  01/11/24 5' 11 (1.803 m)    Weight:   Wt Readings from Last 1 Encounters:  02/11/24 65.5 kg    BMI:  Body mass index is 20.15 kg/m.  Estimated Nutritional Needs:   Kcal:  2200-2600 kcal  Protein:  80-100 g  Fluid:   >/=2L  Simara Rhyner Daml-Budig, RDN, LDN Registered Dietitian Nutritionist RD Inpatient Contact Info in [place]

## 2024-02-11 NOTE — Progress Notes (Addendum)
 PHARMACY - ANTICOAGULATION CONSULT NOTE  Pharmacy Consult for Warfarin Indication: On-X mechanical AVR  Allergies  Allergen Reactions   Amoxicillin Hives   Patient Measurements: Height: 5' 11 (180.3 cm) Weight: 65.5 kg (144 lb 8 oz) IBW/kg (Calculated) : 75.3 HEPARIN  DW (KG): 71  Vital Signs: Temp: 98.6 F (37 C) (09/02 0754) Temp Source: Oral (09/02 0754) BP: 132/89 (09/02 0754) Pulse Rate: 81 (09/02 0754)  Labs: Recent Labs    02/09/24 0314 02/10/24 0310 02/11/24 0324  LABPROT 22.1* 23.2* 23.8*  INR 1.8* 2.0* 2.0*   Estimated Creatinine Clearance: 121.7 mL/min (A) (by C-G formula based on SCr of 0.53 mg/dL (L)).  Assessment: 33 yo M presenting with pancreatitis. Warfarin PTA regimen is 6 mg Monday and Thursday and 4.5 mg all other days (total weekly dose: 34.5 mg).  It has been difficult to maintain INR within the narrow 1.5-2 goal range, especially in previous days due to issues with enteral intake.   -INR= 2.0 with slow trend up  Goal of Therapy:  INR 1.5-2.0 Monitor platelets by anticoagulation protocol: Yes   Plan:  Warfarin 4.5 mg x1 at 1600  Check INR daily  Prentice Poisson, PharmD Clinical Pharmacist **Pharmacist phone directory can now be found on [email or web address] ([email or web address]).  Listed under [facility].   Addendum -plans are for GJ tube next Monday and warfarin to be held -Heparin  to start when INR < 1.5  Plan -Hold warfarin -Start heparin  when INR < 1.5 -Daily PT/INR  Prentice Poisson, PharmD Clinical Pharmacist **Pharmacist phone directory can now be found on [email or web address] ([email or web address]).  Listed under [facility].

## 2024-02-11 NOTE — Plan of Care (Signed)
  Problem: Education: Goal: Knowledge of General Education information will improve Description: Including pain rating scale, medication(s)/side effects and non-pharmacologic comfort measures Outcome: Progressing   Problem: Coping: Goal: Level of anxiety will decrease Outcome: Progressing   Problem: Pain Managment: Goal: General experience of comfort will improve and/or be controlled Outcome: Progressing

## 2024-02-11 NOTE — Progress Notes (Addendum)
 Progress Note     Subjective: Patient tolerating postpyloric feeds.   Objective: Vital signs in last 24 hours: Temp:  [97.8 F (36.6 C)-98.6 F (37 C)] 98.6 F (37 C) (09/02 0754) Pulse Rate:  [71-84] 81 (09/02 0754) Resp:  [16-18] 18 (09/02 0754) BP: (107-154)/(82-97) 132/89 (09/02 0754) SpO2:  [98 %-100 %] 100 % (09/02 0754) Weight:  [65.5 kg-66.6 kg] 65.5 kg (09/02 0754) Last BM Date : 02/09/24  Intake/Output from previous day: 09/01 0701 - 09/02 0700 In: 525 [NG/GT:525] Out: -  Intake/Output this shift: Total I/O In: 190 [NG/GT:190] Out: -   PE: General: Pleasant male who is laying in bed in NAD. Lungs: Respiratory effort nonlabored Abd: Soft and ND. Mild tenderness to palpation of epigastric region. No rebound tenderness or guarding. Psych: A&Ox3 with an appropriate affect.  Lab Results:  No results for input(s): WBC, HGB, HCT, PLT in the last 72 hours.  BMET No results for input(s): NA, K, CL, CO2, GLUCOSE, BUN, CREATININE, CALCIUM  in the last 72 hours.  PT/INR Recent Labs    02/10/24 0310 02/11/24 0324  LABPROT 23.2* 23.8*  INR 2.0* 2.0*   CMP     Component Value Date/Time   NA 138 02/06/2024 0500   NA 142 08/30/2021 1430   K 4.0 02/06/2024 0500   CL 103 02/06/2024 0500   CO2 26 02/06/2024 0500   GLUCOSE 135 (H) 02/06/2024 0500   BUN 18 02/06/2024 0500   BUN 13 08/30/2021 1430   CREATININE 0.53 (L) 02/06/2024 0500   CALCIUM  9.0 02/06/2024 0500   PROT 6.5 02/06/2024 0500   ALBUMIN  3.2 (L) 02/06/2024 0500   AST 22 02/06/2024 0500   ALT 27 02/06/2024 0500   ALKPHOS 103 02/06/2024 0500   BILITOT 0.3 02/06/2024 0500   GFRNONAA >60 02/06/2024 0500   Lipase     Component Value Date/Time   LIPASE 344 (H) 01/27/2024 0229       Studies/Results: No results found.   Anti-infectives: Anti-infectives (From admission, onward)    Start     Dose/Rate Route Frequency Ordered Stop   01/18/24 1230  rifaximin   (XIFAXAN ) tablet 550 mg  Status:  Discontinued        550 mg Oral 3 times daily 01/18/24 1132 01/26/24 1323        Assessment/Plan Acute on chronic pancreatitis Calcifications of head of the pancreas Pancreatic pseudocyst Gastric pneumatosis -Most recent CT imaging: CT abd/pel w contrast 8/21 showing no evidence of gastric pneumatosis. Stable multilocular cystic process anterior to the pancreatic head, consistent with pseudocysts.  -Afebrile. Vitals stable. -DG abd 1 view from 8/29 showed retrograde opacification of the proximal small bowel loop, likely proximal duodenum with positive contrast. There is no opacification of stomach. No frank extravasation of contrast outside the presumed confines of the bowel, to suggest frank leak. -No acute surgical intervention recommended at this time. Will reach back out to IR today to discuss IR placed GJ. Ideally can be placed by IR and avoid larger operation. -GI continuing to follow peripherally. Patient to have CT imaging prior to ERCP that is scheduled for outpatient.   FEN: TFs; Feeding supplement (BOOST); Heart diet; IVF per primary team VTE: Warfarin. Will need to be held v transition to heparin  gtt pending procedural plan timing. Will discuss with primary team after discussion with IR today ID: None currently   LOS: 31 days   I reviewed specialist notes, hospitalist notes, last 24 h vitals and pain scores, last 48 h  intake and output, last 24 h labs and trends, and last 24 h imaging results.  This care required moderate level of medical decision making.   Addendum: Discussed with IR, who will proceed with GJ tube. Defer to TRH/IR teams for anticoagulation/bridging plans. Surgery is available if needed but given no need for acute surgical intervention we will sign off at this time. Patient can follow up outpatient with Dr. Dasie if needed after CT/ERCP arranged by GI team   Richerd Silversmith, MD  [facility] 02/11/2024, 9:24  AM Please see Amion for pager number during day hours 7:00am-4:30pm

## 2024-02-11 NOTE — Progress Notes (Signed)
 PROGRESS NOTE    [name] [name]  FMW:[id number] DOB: 18-Jul-1990 DOA: 01/11/2024 PCP: Lucius Krabbe, NP   Brief Narrative:  Patient is a 33 year old male with past medical history significant for hypertension, s/p mechanical aortic valve replacement 09/27/21 on chronic warfarin anticoagulation, recurrent pancreatitis with multiple hospitalizations, asthma, GERD, prior history of alcohol use disorder (abstinent for 10 months PTA).  Patient presented to the hospital on 01/11/2024 with complaints of abdominal pain.  Workup revealed and had episode of pancreatitis, with pseudocyst and acute kidney injury.  CT A/P done on 8/18 revealed gastric pneumatosis, general surgery was consulted, octreotide  stopped.  Patient was unable to tolerate NG tube placement, made n.p.o. again, PICC line was placed and patient was started on TPN on 8/20.  CT abdomen pelvis on 01/30/2024 revealed resolution of the pneumatosis.  General surgery team has okayed dietary advancement.  GI team is assisting in patient's management.  Calorie count is in process, as patient has lost significant amount of weight.  Patient is felt to be malnourished.  Patient does not want to go home with TPN, he would rather have J-tube placed before discharge.  General surgery is following, calorie count in progress.  Plan of J-tube based on the calorie count.    Assessment & Plan:   Principal Problem:   Acute on chronic pancreatitis [facility]) Active Problems:   Pseudocyst of pancreas   S/P AVR (aortic valve replacement) and aortoplasty   Essential hypertension   On pre-exposure prophylaxis for HIV   History of asthma   History of alcohol abuse   Protein-calorie malnutrition, severe   Intraabdominal fluid collection  Acute on chronic pancreatitis, recurrent pancreatitis Acute fluid collection/pseudocyst Gastric pneumatosis -As per chart review, EUS by Dr. Wilhelmenia in 07/2023 that showed cystic lesion at the head of the pancreas  consistent with a pseudocyst and a 5 mm stone within the pancreatic duct at the junction of the head/neck.   -Cytology of the cyst was benign.  He was scheduled for an ERCP with attempted stone extraction in September with Dr. Wilhelmenia but now admitted with abdominal pain. -[facility] was consulted, patient was treated supportively with n.p.o., IVF, core track with postpyloric feeding (initially started on 8/6).  Core track was dislodged on 8/18, briefly treated with liquid diet and core track was replaced.   -CT A/P on 8/18 showed gastric pneumatosis, general surgery was consulted, octreotide  stopped (to avoid dysmotility), patient was unable to tolerate NG tube placement, made n.p.o. again/tube feeds held, gentle IVF continuing, PICC line placed and TPN started on 01/29/2024. -Pneumatosis has resolved.  Cortrack tube has been removed.  Patient was tolerating heart healthy diet however was having intermittent nausea and vomiting.  Could not meet nutritional goals.  He did not want to go home with TPN.  General surgery consulted, they thought it would be too risky to do percutaneous GJ or J-tube.  They discussed with IR, they recommended placing NJ/postpyloric tube to help with nutrition and consider for percutaneous tube later on. Patient had his postpyloric tube placed by IR 02/06/2024, tube feeds started, TPN weaned off 02/07/2024.  Tolerating tube feeds very well.  Still awaiting for final plans and timings of GJ J-tube by IR and general surgery.   S/p mechanical aortic valve replacement and aortoplasty -INR therapeutic.  Patient's goal INR is 1.5-2.  Continue bridging Lovenox  and Coumadin  dosing per pharmacy.  Continue aspirin . -Follows with Dr. Wendel, Cardiology and the Coumadin  Clinic   Essential hypertension - Blood pressure  on the low side and so is the heart rate.  I will discontinue Toprol -XL low-dose 12.5 mg that he takes at night and monitor for now.   Alcohol use disorder, in  remission Stable   Asthma Stable   Anemia of Chronic Disease Stable.   Small intestinal bacterial overgrowth (SIBO) Completed 14 days of rifaximin .  Resolved.   Nutrition Status: Nutrition Problem: Severe Malnutrition Calorie count to see progress.   Tolerating clear liquids. Continue TPN. TPN on discharge.. Body mass index is 20.57 kg/m.  DVT prophylaxis: Coumadin /Lovenox    Code Status: Full Code  Family Communication: None present at bedside.  Plan of care discussed with patient in length and he/she verbalized understanding and agreed with it.  Status is: Inpatient Remains inpatient appropriate because: Awaiting further plans by general surgery.   Estimated body mass index is 20.15 kg/m as calculated from the following:   Height as of this encounter: 5' 11 (1.803 m).   Weight as of this encounter: 65.5 kg.    Nutritional Assessment: Body mass index is 20.15 kg/m.SABRA Seen by dietician.  I agree with the assessment and plan as outlined below: Nutrition Status: Nutrition Problem: Severe Malnutrition Etiology: vomiting, chronic illness, altered GI function Signs/Symptoms: moderate fat depletion, severe muscle depletion Interventions: Refer to RD note for recommendations  . Skin Assessment: I have examined the patient's skin and I agree with the wound assessment as performed by the wound care RN as outlined below:    Consultants:  GI and general surgery  Procedures:  As above  Antimicrobials:  Anti-infectives (From admission, onward)    Start     Dose/Rate Route Frequency Ordered Stop   01/18/24 1230  rifaximin  (XIFAXAN ) tablet 550 mg  Status:  Discontinued        550 mg Oral 3 times daily 01/18/24 1132 01/26/24 1323         Subjective: Patient seen and examined.  Mother at the bedside.  He has no complaint.  Tolerating tube feeds.  Objective: Vitals:   02/11/24 0406 02/11/24 0500 02/11/24 0747 02/11/24 0754  BP: (!) 154/97  132/89 132/89  Pulse:  71  81 81  Resp: 18  18 18   Temp: 97.8 F (36.6 C)   98.6 F (37 C)  TempSrc: Oral   Oral  SpO2: 99%  100% 100%  Weight:  66.6 kg  65.5 kg  Height:        Intake/Output Summary (Last 24 hours) at 02/11/2024 0956 Last data filed at 02/11/2024 0904 Gross per 24 hour  Intake 665 ml  Output --  Net 665 ml   Filed Weights   02/10/24 0500 02/11/24 0500 02/11/24 0754  Weight: 66.7 kg 66.6 kg 65.5 kg    Examination:  General exam: Appears calm and comfortable  Respiratory system: Clear to auscultation. Respiratory effort normal. Cardiovascular system: S1 & S2 heard, RRR. No JVD, murmurs, rubs, gallops or clicks. No pedal edema. Gastrointestinal system: Abdomen is nondistended, soft and nontender. No organomegaly or masses felt. Normal bowel sounds heard. Central nervous system: Alert and oriented. No focal neurological deficits. Extremities: Symmetric 5 x 5 power. Skin: No rashes, lesions or ulcers.  Psychiatry: Judgement and insight appear normal. Mood & affect appropriate.   Data Reviewed: I have personally reviewed following labs and imaging studies  CBC: Recent Labs  Lab 02/05/24 0332 02/06/24 0500 02/07/24 0344  WBC 6.1 5.7 6.7  HGB 10.0* 10.0* 10.1*  HCT 30.9* 30.3* 30.8*  MCV 88.5 87.3 87.3  PLT  270 300 299   Basic Metabolic Panel: Recent Labs  Lab 02/06/24 0500  NA 138  K 4.0  CL 103  CO2 26  GLUCOSE 135*  BUN 18  CREATININE 0.53*  CALCIUM  9.0  MG 1.8  PHOS 4.4   GFR: Estimated Creatinine Clearance: 121.7 mL/min (A) (by C-G formula based on SCr of 0.53 mg/dL (L)). Liver Function Tests: Recent Labs  Lab 02/06/24 0500  AST 22  ALT 27  ALKPHOS 103  BILITOT 0.3  PROT 6.5  ALBUMIN  3.2*   No results for input(s): LIPASE, AMYLASE in the last 168 hours. No results for input(s): AMMONIA in the last 168 hours. Coagulation Profile: Recent Labs  Lab 02/07/24 0344 02/08/24 0410 02/09/24 0314 02/10/24 0310 02/11/24 0324  INR 1.6* 1.7* 1.8*  2.0* 2.0*   Cardiac Enzymes: No results for input(s): CKTOTAL, CKMB, CKMBINDEX, TROPONINI in the last 168 hours. BNP (last 3 results) No results for input(s): PROBNP in the last 8760 hours. HbA1C: No results for input(s): HGBA1C in the last 72 hours. CBG: Recent Labs  Lab 02/10/24 0512 02/10/24 1147 02/10/24 2003 02/11/24 0021 02/11/24 0544  GLUCAP 114* 120* 124* 131* 118*   Lipid Profile: No results for input(s): CHOL, HDL, LDLCALC, TRIG, CHOLHDL, LDLDIRECT in the last 72 hours.  Thyroid Function Tests: No results for input(s): TSH, T4TOTAL, FREET4, T3FREE, THYROIDAB in the last 72 hours. Anemia Panel: No results for input(s): VITAMINB12, FOLATE, FERRITIN, TIBC, IRON, RETICCTPCT in the last 72 hours. Sepsis Labs: No results for input(s): PROCALCITON, LATICACIDVEN in the last 168 hours.  No results found for this or any previous visit (from the past 240 hours).   Radiology Studies: No results found.   Scheduled Meds:  aspirin  EC  81 mg Oral Daily   Chlorhexidine  Gluconate Cloth  6 each Topical Daily   free water   25 mL Per Tube Q1H   insulin  aspart  0-9 Units Subcutaneous Q6H   lipase/protease/amylase  72,000 Units Oral TID AC   pantoprazole  (PROTONIX ) IV  40 mg Intravenous Q12H   polyethylene glycol  17 g Oral TID   sodium chloride  flush  3 mL Intravenous Q12H   warfarin  4.5 mg Oral ONCE-1600   Warfarin - Pharmacist Dosing Inpatient   Does not apply q1600   Continuous Infusions:  feeding supplement (OSMOLITE 1.5 CAL) 70 mL/hr at 02/07/24 1602     LOS: 31 days   Fredia Skeeter, MD [facility]  02/11/2024, 9:56 AM   *Please note that this is a verbal dictation therefore any spelling or grammatical errors are due to the Dragon Medical One system interpretation.  Please page via Amion and do not message via secure chat for urgent patient care matters. Secure chat can be used for non urgent patient care  matters.  How to contact the [facility] Attending or Consulting provider 7A - 7P or covering provider during after hours 7P -7A, for this patient?  Check the care team in [facility] and look for a) attending/consulting [facility] provider listed and b) the [facility] team listed. Page or secure chat 7A-7P. Log into [email or web address] and use [place]'s universal password to access. If you do not have the password, please contact the hospital operator. Locate the [facility] provider you are looking for under [facility] and page to a number that you can be directly reached. If you still have difficulty reaching the provider, please page the [facility] (Director on Call) for the Hospitalists listed on amion for assistance.

## 2024-02-11 NOTE — TOC Progression Note (Signed)
 Transition of Care [facility]) - Progression Note    Patient Details  Name: [name] [name] MRN: [id number] Date of Birth: 01-Jul-1990  Transition of Care [facility]) CM/SW Contact  [name] JONELLE Joe, RN Phone Number: 02/11/2024, 4:02 PM  Clinical Narrative:    CM spoke with Dr. Joesph by phone and asked that she update the patient at the bedside regarding his plan for GJ tube placement.  Dr. Vernon was included in the conversation as well.  Patient will likely have GJ tube placement later this week or early next week once surgery/IR/hospitalist have coordinated.  Dr. Joesph will update the patient at the bedside since patient/mother are frustrated and would like patient to return home when possible.                     Expected Discharge Plan and Services                                               Social Drivers of Health (SDOH) Interventions SDOH Screenings   Food Insecurity: No Food Insecurity (01/11/2024)  Housing: Low Risk  (01/11/2024)  Transportation Needs: No Transportation Needs (01/11/2024)  Utilities: Not At Risk (01/11/2024)  Depression (PHQ2-9): Medium Risk (09/09/2023)  Financial Resource Strain: Low Risk  (04/25/2023)  Physical Activity: Sufficiently Active (04/25/2023)  Social Connections: Unknown (04/25/2023)  Stress: No Stress Concern Present (04/25/2023)  Tobacco Use: Low Risk  (01/20/2024)    Readmission Risk Interventions    01/29/2024   12:53 PM 01/15/2024   11:03 AM  Readmission Risk Prevention Plan  Post Dischage Appt  Complete  Medication Screening  Complete  Transportation Screening Complete Complete  PCP or Specialist Appt within 5-7 Days Complete   Home Care Screening Complete   Medication Review (RN CM) Complete

## 2024-02-12 ENCOUNTER — Ambulatory Visit: Admitting: Gastroenterology

## 2024-02-12 ENCOUNTER — Inpatient Hospital Stay (HOSPITAL_COMMUNITY)

## 2024-02-12 DIAGNOSIS — K859 Acute pancreatitis without necrosis or infection, unspecified: Secondary | ICD-10-CM | POA: Diagnosis not present

## 2024-02-12 DIAGNOSIS — K861 Other chronic pancreatitis: Secondary | ICD-10-CM | POA: Diagnosis not present

## 2024-02-12 LAB — GLUCOSE, CAPILLARY
Glucose-Capillary: 133 mg/dL — ABNORMAL HIGH (ref 70–99)
Glucose-Capillary: 151 mg/dL — ABNORMAL HIGH (ref 70–99)
Glucose-Capillary: 126 mg/dL — ABNORMAL HIGH (ref 70–99)
Glucose-Capillary: 128 mg/dL — ABNORMAL HIGH (ref 70–99)

## 2024-02-12 LAB — CBC
HCT: 33.8 % — ABNORMAL LOW (ref 39.0–52.0)
Hemoglobin: 11.1 g/dL — ABNORMAL LOW (ref 13.0–17.0)
MCH: 28.2 pg (ref 26.0–34.0)
MCHC: 32.8 g/dL (ref 30.0–36.0)
MCV: 86 fL (ref 80.0–100.0)
Platelets: 333 K/uL (ref 150–400)
RBC: 3.93 MIL/uL — ABNORMAL LOW (ref 4.22–5.81)
RDW: 13.9 % (ref 11.5–15.5)
WBC: 7.2 K/uL (ref 4.0–10.5)
nRBC: 0 % (ref 0.0–0.2)

## 2024-02-12 LAB — PROTIME-INR
INR: 1.7 — ABNORMAL HIGH (ref 0.8–1.2)
Prothrombin Time: 20.6 s — ABNORMAL HIGH (ref 11.4–15.2)

## 2024-02-12 MED ORDER — IOHEXOL 350 MG/ML SOLN
75.0000 mL | Freq: Once | INTRAVENOUS | Status: AC | PRN
  Administered 2024-02-12: 75 mL via INTRAVENOUS

## 2024-02-12 MED ORDER — CEFAZOLIN SODIUM-DEXTROSE 2-4 GM/100ML-% IV SOLN
2.0000 g | INTRAVENOUS | Status: AC
  Administered 2024-02-13: 2 g via INTRAVENOUS
  Filled 2024-02-12: qty 100

## 2024-02-12 NOTE — TOC Progression Note (Addendum)
 Transition of Care [facility]) - Progression Note    Patient Details  Name: [name] [name] MRN: [id number] Date of Birth: [birth year]  Transition of Care [facility]) CM/SW Contact  [name] JONELLE Joe, RN Phone Number: 02/12/2024, 10:56 AM  Clinical Narrative:    CM spoke with the patient and mother this morning and they would like to speak with hospital leadership regarding their frustrations at patient's hospital course and long length of stay.   Patient is anxious to return home safely with a GJ tube in place.  I spoke with Dr. Vernon this morning and he agreed to request Dr. Kimble to speak with the patient and mother at the bedside regarding patient's concerns.  Dr. Kimble was made aware and will follow up with the patient/ mother.  Dr. Kimble was provided with mother's cell number to contact.  02/12/24 1604 - CM met with the patient at the bedside to discuss patient's pending GJ tube placement likely planned for tomorrow.  Per Dr. Kimble - patient will likely discharge home closer to Monday.  I set the patient up with [facility] for RN - [facility] order placed to be co-signed by MD.  Holley Herring, RNCM was updated regarding patient's pending surgery  tomorrow and she plans to provide teaching at the bedside on Friday and order enteral feeds and tube feeding equipment at home.  CM will continue to follow the patient for TOC needs to return home when stable by car.                     Expected Discharge Plan and Services                                               Social Drivers of Health (SDOH) Interventions SDOH Screenings   Food Insecurity: No Food Insecurity (01/11/2024)  Housing: Low Risk  (01/11/2024)  Transportation Needs: No Transportation Needs (01/11/2024)  Utilities: Not At Risk (01/11/2024)  Depression (PHQ2-9): Medium Risk (09/09/2023)  Financial Resource Strain: Low Risk  (04/25/2023)  Physical Activity: Sufficiently Active (04/25/2023)  Social  Connections: Unknown (04/25/2023)  Stress: No Stress Concern Present (04/25/2023)  Tobacco Use: Low Risk  (01/20/2024)    Readmission Risk Interventions    01/29/2024   12:53 PM 01/15/2024   11:03 AM  Readmission Risk Prevention Plan  Post Dischage Appt  Complete  Medication Screening  Complete  Transportation Screening Complete Complete  PCP or Specialist Appt within 5-7 Days Complete   Home Care Screening Complete   Medication Review (RN CM) Complete

## 2024-02-12 NOTE — Progress Notes (Signed)
 PHARMACY - ANTICOAGULATION CONSULT NOTE  Pharmacy Consult for Warfarin Indication: On-X mechanical AVR  Allergies  Allergen Reactions   Amoxicillin Hives   Patient Measurements: Height: 5' 11 (180.3 cm) Weight: 65.9 kg (145 lb 4.5 oz) IBW/kg (Calculated) : 75.3 HEPARIN  DW (KG): 71  Vital Signs: Temp: 97.7 F (36.5 C) (09/03 0449) Temp Source: Oral (09/03 0449) BP: 142/91 (09/03 0449) Pulse Rate: 79 (09/03 0449)  Labs: Recent Labs    02/10/24 0310 02/11/24 0324 02/12/24 0146  HGB  --   --  11.1*  HCT  --   --  33.8*  PLT  --   --  333  LABPROT 23.2* 23.8* 20.6*  INR 2.0* 2.0* 1.7*   Estimated Creatinine Clearance: 122.4 mL/min (A) (by C-G formula based on SCr of 0.53 mg/dL (L)).  Assessment: 33 yo M presenting with pancreatitis. Warfarin PTA regimen is 6 mg Monday and Thursday and 4.5 mg all other days (total weekly dose: 34.5 mg).    Pharmacy to start heparin  when INR < 1.5 with plans for GJ tube -INR= 2.0> 1.7  Goal of Therapy:  INR 1.5-2.0 Monitor platelets by anticoagulation protocol: Yes   Plan:  -hold warfarin -Start heparin  when INR < 1.5  Prentice Poisson, PharmD Clinical Pharmacist **Pharmacist phone directory can now be found on [email or web address] ([email or web address]).  Listed under [facility].

## 2024-02-12 NOTE — Progress Notes (Addendum)
 PROGRESS NOTE    [name] [name]  FMW:[id number] DOB: 05-Nov-1990 DOA: 01/11/2024 PCP: Lucius Krabbe, NP   Brief Narrative:  Patient is a 33 year old male with past medical history significant for hypertension, s/p mechanical aortic valve replacement 09/27/21 on chronic warfarin anticoagulation, recurrent pancreatitis with multiple hospitalizations, asthma, GERD, prior history of alcohol use disorder (abstinent for 10 months PTA).  Patient presented to the hospital on 01/11/2024 with complaints of abdominal pain.  Workup revealed and had episode of pancreatitis, with pseudocyst and acute kidney injury.  CT A/P done on 8/18 revealed gastric pneumatosis, general surgery was consulted, octreotide  stopped.  Patient was unable to tolerate NG tube placement, made n.p.o. again, PICC line was placed and patient was started on TPN on 8/20.  CT abdomen pelvis on 01/30/2024 revealed resolution of the pneumatosis.  General surgery team has okayed dietary advancement.  GI team is assisting in patient's management.  Calorie count is in process, as patient has lost significant amount of weight.  Patient is felt to be malnourished.  Patient does not want to go home with TPN, he would rather have J-tube placed before discharge.  General surgery is following, calorie count in progress.  Plan of J-tube based on the calorie count.    Assessment & Plan:   Principal Problem:   Acute on chronic pancreatitis [facility]) Active Problems:   Pseudocyst of pancreas   S/P AVR (aortic valve replacement) and aortoplasty   Essential hypertension   On pre-exposure prophylaxis for HIV   History of asthma   History of alcohol abuse   Protein-calorie malnutrition, severe   Intraabdominal fluid collection  Acute on chronic pancreatitis, recurrent pancreatitis Acute fluid collection/pseudocyst Gastric pneumatosis -As per chart review, EUS by Dr. Wilhelmenia in 07/2023 that showed cystic lesion at the head of the pancreas  consistent with a pseudocyst and a 5 mm stone within the pancreatic duct at the junction of the head/neck.   -Cytology of the cyst was benign.  He was scheduled for an ERCP with attempted stone extraction in September with Dr. Wilhelmenia but now admitted with abdominal pain. -[facility] was consulted, patient was treated supportively with n.p.o., IVF, core track with postpyloric feeding (initially started on 8/6).  Core track was dislodged on 8/18, briefly treated with liquid diet and core track was replaced.   -CT A/P on 8/18 showed gastric pneumatosis, general surgery was consulted, octreotide  stopped (to avoid dysmotility), patient was unable to tolerate NG tube placement, made n.p.o. again/tube feeds held, gentle IVF continuing, PICC line placed and TPN started on 01/29/2024. -Pneumatosis has resolved.  Cortrack tube has been removed.  Patient was tolerating heart healthy diet however was having intermittent nausea and vomiting.  Could not meet nutritional goals.  He did not want to go home with TPN.  General surgery consulted 01/27/2024, they thought it would be too risky to do percutaneous GJ or J-tube.  They started discussing with IR 02/04/2024, IR recommended placing NJ/postpyloric tube to help with nutrition and consider for percutaneous tube later on. Patient had his postpyloric tube placed by IR 02/06/2024, tube feeds started, TPN weaned off 02/07/2024.  Tolerating tube feeds very well.  Awaited for general surgery and IR to make a final decision through the whole Labor Day weekend.  Eventually, IR made a final plan to place GJ however patient is on Coumadin  and IR wants him to be off of Coumadin  and INR to be less than 5 for 4 days and then they will do procedure  but unfortunately that would lead him to the weekend and per IR, they would not do the procedure on the weekends since it is elective and they are planning to do the procedure on Monday.  Patient's mother and patient very frustrated about  extended hospitalization.  Patient's last Coumadin  was on 02/10/2024, he will be bridged with heparin  once INR is subtherapeutic.  Repeating CT abdomen today.   S/p mechanical aortic valve replacement and aortoplasty -INR therapeutic.  Patient's goal INR is 1.5-2.  Coumadin  on hold as mentioned above.  Aspirin  also on hold for 5 days per IR request. -Follows with Dr. Wendel, Cardiology and the Coumadin  Clinic   Essential hypertension - Blood pressure on the low side and so is the heart rate.  I will discontinue Toprol -XL low-dose 12.5 mg that he takes at night and monitor for now.   Alcohol use disorder, in remission Stable   Asthma Stable   Anemia of Chronic Disease Stable.   Small intestinal bacterial overgrowth (SIBO) Completed 14 days of rifaximin .  Resolved.   Nutrition Status: Nutrition Problem: Severe Malnutrition Calorie count to see progress.   Tolerating clear liquids. Continue TPN. TPN on discharge.. Body mass index is 20.57 kg/m.  DVT prophylaxis: Coumadin /Lovenox    Code Status: Full Code  Family Communication: Mother present at bedside.  Plan of care discussed with patient in length and he/she verbalized understanding and agreed with it.  Status is: Inpatient Remains inpatient appropriate because: Plan for GJ-tube on Monday.   Estimated body mass index is 20.26 kg/m as calculated from the following:   Height as of this encounter: 5' 11 (1.803 m).   Weight as of this encounter: 65.9 kg.    Nutritional Assessment: Body mass index is 20.26 kg/m.SABRA Seen by dietician.  I agree with the assessment and plan as outlined below: Nutrition Status: Nutrition Problem: Severe Malnutrition Etiology: vomiting, chronic illness, altered GI function Signs/Symptoms: moderate fat depletion, severe muscle depletion Interventions: Refer to RD note for recommendations  . Skin Assessment: I have examined the patient's skin and I agree with the wound assessment as performed  by the wound care RN as outlined below:    Consultants:  GI and general surgery  Procedures:  As above  Antimicrobials:  Anti-infectives (From admission, onward)    Start     Dose/Rate Route Frequency Ordered Stop   01/18/24 1230  rifaximin  (XIFAXAN ) tablet 550 mg  Status:  Discontinued        550 mg Oral 3 times daily 01/18/24 1132 01/26/24 1323         Subjective: Seen and examined.  Complains of bandlike abdominal pain which goes to the back for last 3 days.  It is intermittent and mild.  Some nausea as well but no vomiting.  Patient has not been eating anything through the mouth for few days.  Discussed with the patient and we will get repeat CT abdomen pelvis with IV contrast to look at the pancreas and pancreatic cyst status.  Objective: Vitals:   02/11/24 2128 02/12/24 0449 02/12/24 0500 02/12/24 0823  BP: (!) 137/94 (!) 142/91  117/87  Pulse: 81 79  80  Resp:    18  Temp: 97.9 F (36.6 C) 97.7 F (36.5 C)  98.3 F (36.8 C)  TempSrc: Oral Oral    SpO2: 100% 100%  99%  Weight:   65.9 kg   Height:        Intake/Output Summary (Last 24 hours) at 02/12/2024 1118 Last data filed  at 02/12/2024 1056 Gross per 24 hour  Intake 2060.17 ml  Output --  Net 2060.17 ml   Filed Weights   02/11/24 0500 02/11/24 0754 02/12/24 0500  Weight: 66.6 kg 65.5 kg 65.9 kg    Examination:  General exam: Appears calm and comfortable  Respiratory system: Clear to auscultation. Respiratory effort normal. Cardiovascular system: S1 & S2 heard, RRR. No JVD, murmurs, rubs, gallops or clicks. No pedal edema. Gastrointestinal system: Abdomen is nondistended, soft and very mild epigastric tenderness. No organomegaly or masses felt. Normal bowel sounds heard. Central nervous system: Alert and oriented. No focal neurological deficits. Extremities: Symmetric 5 x 5 power. Skin: No rashes, lesions or ulcers.  Psychiatry: Judgement and insight appear normal. Mood & affect appropriate.   Data  Reviewed: I have personally reviewed following labs and imaging studies  CBC: Recent Labs  Lab 02/06/24 0500 02/07/24 0344 02/12/24 0146  WBC 5.7 6.7 7.2  HGB 10.0* 10.1* 11.1*  HCT 30.3* 30.8* 33.8*  MCV 87.3 87.3 86.0  PLT 300 299 333   Basic Metabolic Panel: Recent Labs  Lab 02/06/24 0500  NA 138  K 4.0  CL 103  CO2 26  GLUCOSE 135*  BUN 18  CREATININE 0.53*  CALCIUM  9.0  MG 1.8  PHOS 4.4   GFR: Estimated Creatinine Clearance: 122.4 mL/min (A) (by C-G formula based on SCr of 0.53 mg/dL (L)). Liver Function Tests: Recent Labs  Lab 02/06/24 0500  AST 22  ALT 27  ALKPHOS 103  BILITOT 0.3  PROT 6.5  ALBUMIN  3.2*   No results for input(s): LIPASE, AMYLASE in the last 168 hours. No results for input(s): AMMONIA in the last 168 hours. Coagulation Profile: Recent Labs  Lab 02/08/24 0410 02/09/24 0314 02/10/24 0310 02/11/24 0324 02/12/24 0146  INR 1.7* 1.8* 2.0* 2.0* 1.7*   Cardiac Enzymes: No results for input(s): CKTOTAL, CKMB, CKMBINDEX, TROPONINI in the last 168 hours. BNP (last 3 results) No results for input(s): PROBNP in the last 8760 hours. HbA1C: No results for input(s): HGBA1C in the last 72 hours. CBG: Recent Labs  Lab 02/11/24 0544 02/11/24 1205 02/11/24 1810 02/12/24 0101 02/12/24 0634  GLUCAP 118* 132* 142* 133* 151*   Lipid Profile: No results for input(s): CHOL, HDL, LDLCALC, TRIG, CHOLHDL, LDLDIRECT in the last 72 hours.  Thyroid Function Tests: No results for input(s): TSH, T4TOTAL, FREET4, T3FREE, THYROIDAB in the last 72 hours. Anemia Panel: No results for input(s): VITAMINB12, FOLATE, FERRITIN, TIBC, IRON, RETICCTPCT in the last 72 hours. Sepsis Labs: No results for input(s): PROCALCITON, LATICACIDVEN in the last 168 hours.  No results found for this or any previous visit (from the past 240 hours).   Radiology Studies: No results found.   Scheduled Meds:   Chlorhexidine  Gluconate Cloth  6 each Topical Daily   free water   25 mL Per Tube Q1H   insulin  aspart  0-9 Units Subcutaneous Q6H   lipase/protease/amylase  72,000 Units Oral TID AC   pantoprazole  (PROTONIX ) IV  40 mg Intravenous Q12H   polyethylene glycol  17 g Oral TID   sodium chloride  flush  3 mL Intravenous Q12H   Continuous Infusions:  feeding supplement (OSMOLITE 1.5 CAL) 70 mL/hr at 02/12/24 0323     LOS: 32 days   Fredia Skeeter, MD [facility]  02/12/2024, 11:18 AM  Total time spent 45 minutes today talking for the full encounter which includes talking to the family. *Please note that this is a verbal dictation therefore any spelling or grammatical  errors are due to the [employer] system interpretation.  Please page via Amion and do not message via secure chat for urgent patient care matters. Secure chat can be used for non urgent patient care matters.  How to contact the [facility] Attending or Consulting provider 7A - 7P or covering provider during after hours 7P -7A, for this patient?  Check the care team in [facility] and look for a) attending/consulting [facility] provider listed and b) the [facility] team listed. Page or secure chat 7A-7P. Log into [email or web address] and use [facility]'s universal password to access. If you do not have the password, please contact the hospital operator. Locate the [facility] provider you are looking for under [facility] and page to a number that you can be directly reached. If you still have difficulty reaching the provider, please page the [facility] (Director on Call) for the Hospitalists listed on amion for assistance.

## 2024-02-12 NOTE — Plan of Care (Signed)
   Problem: Education: Goal: Knowledge of General Education information will improve Description Including pain rating scale, medication(s)/side effects and non-pharmacologic comfort measures Outcome: Progressing

## 2024-02-12 NOTE — Consult Note (Addendum)
 Chief Complaint: Acute on chronic pancreatitis; Gastric pneumatosis  Referring Provider(s): Dr. Vernon  Supervising Physician: Jennefer Rover  Patient Status: [facility] - In-pt  History of Present Illness: [name] [name] is a 33 y.o. male with PMH significant for HTN, s/p mechanical aortic valve replacement 09/27/21 on chronic warfarin anticoagulation, recurrent pancreatitis with multiple hospitalizations, asthma, GERD, prior history of alcohol use disorder. He was admitted on 01/11/24 after presenting to the ED with complaint of abd pain. Workup revealed and had episode of pancreatitis, with pseudocyst and acute kidney injury. CT A/P done on 8/18 revealed gastric pneumatosis which is no longer seen in most recent 8/21 CT imaging.    PICC line was placed and patient was started on TPN on 8/20 when patient was originally unable to tolerate NG. Since then, post pyloric feedings via Cortrak have been tolerated.  Patient being followed by surgery who does not recommend acute surgical intervention at this time. IR consulted for GJ tube.  Does not wear CPAP or use supplemental home O2. He has tolerated moderate sedation well in the past.   Denies fever, chills, SOB, CP, blood in urine or stool, leg swelling. He does endorse continued abd pain.   Allergies Reviewed:  Amoxicillin   Patient is Full Code  Past Medical History:  Diagnosis Date   Asthma    Headache    migraines   Heart murmur    Hypokalemia 06/08/2020   Leukocytosis 01/23/2022   Mild aortic stenosis    Pancreatitis    Pneumonia    as a child x several   Substance abuse [facility])     Past Surgical History:  Procedure Laterality Date   AORTIC VALVE REPLACEMENT N/A 09/27/2021   Procedure: AORTIC VALVE REPLACEMENT (AVR) USING ON-X AORTIC VALVE SIZE ;  Surgeon: Kerrin Elspeth BROCKS, MD;  Location: [facility] OR;  Service: Open Heart Surgery;  Laterality: N/A;   BIOPSY  08/01/2023   Procedure: BIOPSY;  Surgeon:  Wilhelmenia Aloha Raddle., MD;  Location: [facility]COPY;  Service: Gastroenterology;;   ESOPHAGOGASTRODUODENOSCOPY N/A 08/01/2023   Procedure: ESOPHAGOGASTRODUODENOSCOPY (EGD);  Surgeon: Wilhelmenia Aloha Raddle., MD;  Location: [facility];  Service: Gastroenterology;  Laterality: N/A;   EUS N/A 08/01/2023   Procedure: UPPER ENDOSCOPIC ULTRASOUND (EUS) RADIAL;  Surgeon: Wilhelmenia Aloha Raddle., MD;  Location: [facility];  Service: Gastroenterology;  Laterality: N/A;   FINE NEEDLE ASPIRATION N/A 08/01/2023   Procedure: FINE NEEDLE ASPIRATION (FNA) LINEAR;  Surgeon: Wilhelmenia Aloha Raddle., MD;  Location: [facility];  Service: Gastroenterology;  Laterality: N/A;   INGUINAL HERNIA REPAIR Right 01/05/2022   Procedure: OPEN REPAIR INCARCERATED RIGHT INGUINAL HERNIA WITH MESH;  Surgeon: Tanda Locus, MD;  Location: [facility] ORS;  Service: General;  Laterality: Right;  90 MIN - ROOM 2   INGUINAL HERNIA REPAIR Left 12/20/2022   Procedure: OPEN REPAIR LEFT INGUINAL HERNIA WITH MESH;  Surgeon: Tanda Locus, MD;  Location: [facility] ORS;  Service: General;  Laterality: Left;   REPLACEMENT ASCENDING AORTA N/A 09/27/2021   Procedure: REPLACEMENT ASCENDING AND INNOMINATE ANEURYSM USING HEMASHIELD PLATINUM [id number];  Surgeon: Kerrin Elspeth BROCKS, MD;  Location: [facility] OR;  Service: Open Heart Surgery;  Laterality: N/A;   RIGHT HEART CATH AND CORONARY ANGIOGRAPHY N/A 09/07/2021   Procedure: RIGHT HEART CATH AND CORONARY ANGIOGRAPHY;  Surgeon: Wendel Lurena POUR, MD;  Location: [facility] INVASIVE CV LAB;  Service: Cardiovascular;  Laterality: N/A;   TEE WITHOUT CARDIOVERSION N/A 09/27/2021   Procedure: TRANSESOPHAGEAL ECHOCARDIOGRAM (TEE);  Surgeon: Kerrin Elspeth BROCKS, MD;  Location: [facility] OR;  Service: Open Heart Surgery;  Laterality: N/A;      Medications: Prior to Admission medications   Medication Sig Start Date End Date Taking? Authorizing Provider  acetaminophen  (TYLENOL ) 500 MG tablet Take 1,000 mg by mouth every 6 (six)  hours as needed for moderate pain (pain score 4-6).   Yes [provider]  aspirin  EC 81 MG EC tablet Take 1 tablet (81 mg total) by mouth daily. Swallow whole. 10/02/21  Yes Barrett, Erin R, [name]  dicyclomine  (BENTYL ) 10 MG capsule TAKE 2 CAPSULES(20 MG) BY MOUTH FOUR TIMES DAILY BEFORE MEALS AND AT BEDTIME Patient taking differently: Take 20 mg by mouth 4 (four) times daily -  before meals and at bedtime. 10/25/23  Yes McMichael, Bayley M, [name]  lipase/protease/amylase (CREON ) 36000 UNITS CPEP capsule Take 2 capsules (72,000 Units total) by mouth 3 (three) times daily before meals. 12/09/23  Yes Perri DELENA Meliton Mickey., MD  loratadine  (CLARITIN ) 10 MG tablet Take 10 mg by mouth daily.   Yes [provider]  methocarbamol  (ROBAXIN ) 500 MG tablet Take 1-2 tablets (500-1,000 mg total) by mouth 4 (four) times daily. Patient taking differently: Take 500 mg by mouth daily as needed for muscle spasms. 11/19/23  Yes Lucius Krabbe, NP  metoprolol  succinate (TOPROL  XL) 25 MG 24 hr tablet Take 0.5 tablets (12.5 mg total) by mouth at bedtime. 07/10/23  Yes Thukkani, Arun K, MD  warfarin (COUMADIN ) 3 MG tablet TAKE 1 1/2 TABLETS TO 2 TABLETS BY MOUTH DAILY OR AS DIRECTED BY COAGULATION CLINIC Patient taking differently: Take 4.5-6 mg by mouth See admin instructions. Take 6mg  (2 tablets) by mouth every Monday and Wednesday, and take 4.5mg  (1 and 1/2 tablet) every other day of the week. 08/21/23  Yes Thukkani, Arun K, MD  emtricitabine -tenofovir  (TRUVADA) 200-300 MG tablet Take 1 tablet by mouth daily. Patient not taking: Reported on 01/11/2024 11/06/23   [provider]     Family History  Problem Relation Age of Onset   Pancreatitis Maternal Uncle    Colon cancer Neg Hx    Esophageal cancer Neg Hx     Social History   Socioeconomic History   Marital status: Single    Spouse name: Not on file   Number of children: 0   Years of education: Not on file   Highest education level:  Bachelor's degree (e.g., BA, AB, BS)  Occupational History   Occupation: Bartender    Comment: [employer]  Tobacco Use   Smoking status: Never   Smokeless tobacco: Never  Vaping Use   Vaping status: Never Used  Substance and Sexual Activity   Alcohol use: Not Currently    Comment: none x 1 month as od 04/11/2023   Drug use: Not Currently    Types: Other-see comments    Comment: Edibles occasionally marijuana   Sexual activity: Yes  Other Topics Concern   Not on file  Social History Narrative   Not on file   Social Drivers of Health   Financial Resource Strain: Low Risk  (04/25/2023)   Overall Financial Resource Strain (CARDIA)    Difficulty of Paying Living Expenses: Not very hard  Food Insecurity: No Food Insecurity (01/11/2024)   Hunger Vital Sign    Worried About Running Out of Food in the Last Year: Never true    Ran Out of Food in the Last Year: Never true  Transportation Needs: No Transportation Needs (01/11/2024)   PRAPARE - Transportation  Lack of Transportation (Medical): No    Lack of Transportation (Non-Medical): No  Physical Activity: Sufficiently Active (04/25/2023)   Exercise Vital Sign    Days of Exercise per Week: 5 days    Minutes of Exercise per Session: 150+ min  Stress: No Stress Concern Present (04/25/2023)   [employer] of Occupational Health - Occupational Stress Questionnaire    Feeling of Stress : Only a little  Social Connections: Unknown (04/25/2023)   Social Connection and Isolation Panel    Frequency of Communication with Friends and Family: Three times a week    Frequency of Social Gatherings with Friends and Family: Once a week    Attends Religious Services: Patient declined    Database administrator or Organizations: No    Attends Engineer, structural: Not on file    Marital Status: Never married     Review of Systems: A 12 point ROS discussed and pertinent positives are indicated in the HPI above.  All  other systems are negative.    Vital Signs: BP 117/87 (BP Location: Left Arm)   Pulse 80   Temp 98.3 F (36.8 C)   Resp 18   Ht 5' 11 (1.803 m)   Wt 145 lb 4.5 oz (65.9 kg)   SpO2 99%   BMI 20.26 kg/m     Physical Exam HENT:     Head:     Comments: NG present    Mouth/Throat:     Mouth: Mucous membranes are moist.     Pharynx: Oropharynx is clear.  Cardiovascular:     Rate and Rhythm: Normal rate and regular rhythm.  Pulmonary:     Effort: Pulmonary effort is normal.     Breath sounds: Normal breath sounds.  Abdominal:     General: There is no distension.     Palpations: Abdomen is soft.     Tenderness: There is abdominal tenderness in the left upper quadrant. There is no guarding.  Skin:    General: Skin is warm and dry.     Capillary Refill: Capillary refill takes less than 2 seconds.     Comments: No rash or wounds over planned puncture site.  R PICC unremarkable, dressing C/D/I  Neurological:     General: No focal deficit present.     Mental Status: He is alert and oriented to person, place, and time.  Psychiatric:        Mood and Affect: Mood normal.        Behavior: Behavior normal.     Imaging: DG Abd 1 View Result Date: 02/06/2024 CLINICAL DATA:  [id number] Encounter for feeding tube placement [phone]. EXAM: ABDOMEN - 1 VIEW COMPARISON:  01/27/2024. FINDINGS: The bowel gas pattern is non-obstructive. No evidence of pneumoperitoneum, within the limitations of a supine film. No acute osseous abnormalities. The soft tissues are within normal limits. Surgical changes, devices, tubes and lines: There is retrograde opacification of the proximal small bowel loop, likely proximal duodenum with positive contrast. There is no opacification of stomach. No frank extravasation of contrast outside the presumed confines of the bowel, to suggest frank leak. IMPRESSION: Nonobstructive bowel gas pattern. There is retrograde opacification of the proximal small bowel loop, likely  proximal duodenum with positive contrast. There is no opacification of stomach. No frank extravasation of contrast outside the presumed confines of the bowel, to suggest frank leak. Electronically Signed   By: Ree Molt M.D.   On: 02/06/2024 08:07   CT ABDOMEN PELVIS W CONTRAST  Result Date: 01/30/2024 EXAM: CT ABDOMEN AND PELVIS WITH CONTRAST 01/30/2024 01:52:00 PM TECHNIQUE: CT of the abdomen and pelvis was performed with the administration of intravenous contrast. Multiplanar reformatted images are provided for review. Automated exposure control, iterative reconstruction, and/or weight-based adjustment of the mA/kV was utilized to reduce the radiation dose to as low as reasonably achievable. COMPARISON: 01/27/2024 CLINICAL HISTORY: Gastric pneumatosis. FINDINGS: LOWER CHEST: Minimal dependent atelectasis at the left lung base. LIVER: Normal size and contour. GALLBLADDER AND BILE DUCTS: No wall thickening. No cholelithiasis. No biliary ductal dilatation. SPLEEN: Normal size. No focal lesion. PANCREAS: Scattered coarse calcifications in the pancreatic head and body as before. Dilated pancreatic duct. Multilocular cystic process anterior to the pancreatic head, abutting the gastric antrum, measured approximately 4.2 cm maximum in transverse diameter, relatively stable from prior study, consistent with pseudocysts. ADRENAL GLANDS: Normal appearance. No mass. KIDNEYS, URETERS AND BLADDER: No stones in the kidneys or ureters. No hydronephrosis. No perinephric or periureteral stranding. Urinary bladder is unremarkable. GI AND BOWEL: Stomach is decompressed. No pneumatosis. Normal appendix. PERITONEUM AND RETROPERITONEUM: No ascites. No free air. VASCULATURE: Tapered narrowing of the splenic vein in the midline. Moderate narrowing of the superior mesenteric vein near the portal - splenic confluence without definite venous collateral filling. LYMPH NODES: No lymphadenopathy. REPRODUCTIVE ORGANS: No significant  abnormality. BONES AND SOFT TISSUES: Sternotomy wires. Feeding tube to the proximal jejunum. No acute osseous abnormality. No focal soft tissue abnormality. IMPRESSION: 1. No evidence of gastric pneumatosis. 2. Stable multilocular cystic process anterior to the pancreatic head, consistent with pseudocysts. 3. Tapered narrowing of the splenic vein in the midline and moderate narrowing of the superior mesenteric vein near the portal-splenic confluence without definite venous collaterals. Electronically signed by: Katheleen Faes MD 01/30/2024 04:40 PM EDT RP Workstation: [id number]   US  EKG SITE RITE Result Date: 01/28/2024 If Site Rite image not attached, placement could not be confirmed due to current cardiac rhythm.  DG Abd Portable 1V Result Date: 01/27/2024 CLINICAL DATA:  Feeding tube placement. EXAM: PORTABLE ABDOMEN - 1 VIEW COMPARISON:  01/21/2024 FINDINGS: Feeding tube tip is in the distal duodenum at the ligament of Treitz. Visualized upper abdomen demonstrates nonspecific bowel gas pattern. IMPRESSION: Feeding tube tip is in the distal duodenum at the ligament of Treitz. Electronically Signed   By: Camellia Candle M.D.   On: 01/27/2024 10:50   CT ABDOMEN PELVIS W WO CONTRAST Addendum Date: 01/27/2024 ADDENDUM REPORT: 01/27/2024 05:49 ADDENDUM: I discussed the results of this study directly with Dr. Charlton by telephone at approximately 0549 hours on 01/27/2024. Electronically Signed   By: Camellia Candle M.D.   On: 01/27/2024 05:49   Result Date: 01/27/2024 CLINICAL DATA:  Pancreatitis. EXAM: CT ABDOMEN AND PELVIS WITHOUT AND WITH CONTRAST TECHNIQUE: Multidetector CT imaging of the abdomen and pelvis was performed following the standard protocol before and following the bolus administration of intravenous contrast. RADIATION DOSE REDUCTION: This exam was performed according to the departmental dose-optimization program which includes automated exposure control, adjustment of the mA and/or kV according to  patient size and/or use of iterative reconstruction technique. CONTRAST:  OMNIPAQUE  IOHEXOL  350 MG/ML SOLN COMPARISON:  01/23/2024 MRI.  01/22/2024 CT scan. FINDINGS: Lower chest: No acute findings. Hepatobiliary: No suspicious focal abnormality within the liver parenchyma. There is no evidence for gallstones, gallbladder wall thickening, or pericholecystic fluid. No intrahepatic or extrahepatic biliary dilation. Pancreas: Marked dilatation of the main pancreatic duct noted measuring 12 mm diameter today compared to about 11 mm diameter  1 measured again on the previous CT. Scattered calcification noted in the parenchyma of the body and tail the pancreas as before compatible with sequelae of chronic pancreatitis. Bilobed 3.5 x 1.9 cm fluid collection anterior to the pancreatic head has increased from 2.3 x 1.3 cm previously. The fluid collection identified on previous MRI between the pancreatic tail and lesser curvature of the stomach measures 5.7 x 2.0 cm today compared to 6.9 x 2.3 cm on the previous MRI. There is thickening in the distal stomach and duodenum with perigastric compared duodenal fluid as before. Small fluid collection seen along the inferior capsule of the lateral segment left liver, similar to prior. Pancreatic parenchyma enhances throughout. Spleen: No splenomegaly. No suspicious focal mass lesion. Adrenals/Urinary Tract: No adrenal nodule or mass. Kidneys unremarkable. No evidence for hydroureter. The urinary bladder appears normal for the degree of distention. Stomach/Bowel: Fundal diverticulum noted. Gastric fundus and body is distended with fluid. No visible NG tube on this exam. Areas of curvilinear gas are seen between the dependent intraluminal gastric fluid in the wall of the stomach in the posterior and lateral portions of the fundus, concerning for pneumatosis although gas trapped between the gastric contents in the wall could potentially have this appearance. Distal stomach and  duodenum are involved by edema/fluid/inflammation with associated luminal narrowing. Ampulla is prominent, bulging into the lumen of the duodenum. Transverse duodenum is more preserved. No small bowel wall thickening. No small bowel dilatation. The terminal ileum is normal. The appendix is normal. No gross colonic mass. No colonic wall thickening. Vascular/Lymphatic: No abdominal aortic aneurysm. No abdominal aortic atherosclerotic calcification. Portal vein is markedly attenuated in the region of the portal splenic confluence the does appear to remain patent although there is just a string sign of lumen visible as the SM V enters the portal venous anatomy. The central splenic vein is also markedly attenuated at the confluence but splenic vein remains patent. Celiac axis, splenic artery, and SMA are patent without evidence for pseudoaneurysm. Scattered small abdominal lymph nodes evident. No pelvic sidewall lymphadenopathy. Reproductive: The prostate gland and seminal vesicles are unremarkable. Other: Scattered tiny foci of extraluminal gas are identified in the anterior abdomen. 1 collection is seen just deep to the right rectus musculature on axial 93/5. This was present previously and may be within the preperitoneal space as opposed to truly intraperitoneal in location although intraperitoneal gas is a possibility. Another collection is seen along the left rectus musculature but appears to be within the left rectus sheath (99/5). Both of these findings were present previously with some subcutaneous gas in the anterior abdominal wall on the previous CT presumably reflecting injection sites. Trace free fluid is seen in the pelvis. Musculoskeletal: No worrisome lytic or sclerotic osseous abnormality. IMPRESSION: 1. Areas of curvilinear gas density are seen between the dependent intraluminal gastric fluid and the wall of the stomach in the posterior and lateral portions of the fundus, concerning for pneumatosis. The  possibility that this represents a layer of gas trapped between the gastric contents and the wall is not entirely excluded, but gas is tracking along the dependent wall the stomach which would be somewhat unusual. 2. Marked dilatation of the main pancreatic duct measuring 12 mm diameter today compared to about 11 mm diameter previously. Pancreatic parenchyma enhances throughout. 3. The fluid collection identified on previous MRI between the pancreatic tail and lesser curvature of the stomach measures 5.7 x 2.0 cm today compared to 6.9 x 2.3 cm on the previous MRI.  4. Bilobed fluid collection anterior to the pancreatic head has increased from 2.3 x 1.3 cm previously. 5. 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 The central splenic vein is also markedly attenuated at the confluence but splenic vein remains patent. 6. Scattered tiny foci of extraluminal gas in the anterior abdomen. The volume of this trace gas appear stable to minimally decreased in the interval since prior CT. One collection is seen just deep to the right rectus musculature on axial 93/5. This was present on the previous CT and may be within the preperitoneal space as opposed to truly intraperitoneal in location although intraperitoneal gas is a possibility. Another collection is seen along the left rectus musculature but appears to be within the left rectus sheath. Both of these findings were present previously. 7. Trace free fluid in the pelvis. Electronically Signed: By: Camellia Candle M.D. On: 01/27/2024 05:28   MR ABDOMEN MRCP W WO CONTAST Result Date: 01/23/2024 CLINICAL DATA:  Pancreatitis EXAM: MRI ABDOMEN WITHOUT AND WITH CONTRAST (INCLUDING MRCP) TECHNIQUE: Multiplanar multisequence MR imaging of the abdomen was performed both before and after the administration of intravenous contrast. Heavily T2-weighted images of the biliary and pancreatic ducts were obtained,  and three-dimensional MRCP images were rendered by post processing. CONTRAST:  6.5mL GADAVIST  GADOBUTROL  1 MMOL/ML IV SOLN COMPARISON:  CT abdomen pelvis, 01/22/2024 FINDINGS: Lower chest: No acute abnormality. Hepatobiliary: No solid liver abnormality is seen. No gallstones, gallbladder wall thickening, or biliary dilatation. Pancreas: Diffuse inflammatory fat stranding about the pancreas. Severe pancreatic ductal dilatation measuring up to 1.0 cm in caliber in the pancreatic neck, the pancreatic duct effaced within the central pancreatic head (series 3, image 22). Interval development of an elongated fluid collection superior to the pancreatic body and tail and adjacent to the lesser curvature of the stomach, measuring 6.9 x 2.3 x 3.0 cm (series 3, image 18, series 2, image 22). Unchanged heterogeneous fluid collection ventral to the pancreatic head, involving the gastric antrum and pylorus measuring 3.0 x 2.1 x 3.0 cm (series 3, image 23, series 2, image 22) Spleen: Normal in size without significant abnormality. Adrenals/Urinary Tract: Adrenal glands are unremarkable. Kidneys are normal, without renal calculi, solid lesion, or hydronephrosis. Stomach/Bowel: Diverticulum of the gastric fundus. Very extensive, severe inflammatory fat stranding and wall thickening of the gastric antrum, pylorus, and proximal duodenal with associated complex fluid collection as described above (series 3, image 25). No evidence of bowel wall thickening, distention, or inflammatory changes. Vascular/Lymphatic: No significant vascular findings are present. No enlarged abdominal lymph nodes. Other: No abdominal wall hernia or abnormality. Small volume ascites. Musculoskeletal: No acute or significant osseous findings. IMPRESSION: 1. Diffuse inflammatory fat stranding about the pancreas, consistent with acute pancreatitis. 2. Severe pancreatic ductal dilatation measuring up to 1.0 cm in caliber in the pancreatic neck, the pancreatic duct  effaced within the central pancreatic head. 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 3. Since earlier same day examination, interval development of an elongated acute pancreatic fluid collection superior to the pancreatic body and tail and adjacent to the lesser curvature of the stomach, measuring 6.9 x 2.3 x 3.0 cm. 4. Unchanged heterogeneous fluid collection ventral to the pancreatic head, involving the severely inflamed gastric antrum and pylorus measuring 3.0 x 2.1 x 3.0 cm, consistent with acute pancreatic fluid collection and concerning for ulceration to the bowel lumen given size, complexity, and significant involvement of the bowel wall. 5.  Presence or absence of infection within the above described fluid collections is not established by imaging. 6. Small volume ascites. Electronically Signed   By: Marolyn JONETTA Jaksch M.D.   On: 01/23/2024 22:08   MR 3D Recon At Scanner Result Date: 01/23/2024 CLINICAL DATA:  Pancreatitis EXAM: MRI ABDOMEN WITHOUT AND WITH CONTRAST (INCLUDING MRCP) TECHNIQUE: Multiplanar multisequence MR imaging of the abdomen was performed both before and after the administration of intravenous contrast. Heavily T2-weighted images of the biliary and pancreatic ducts were obtained, and three-dimensional MRCP images were rendered by post processing. CONTRAST:  6.5mL GADAVIST  GADOBUTROL  1 MMOL/ML IV SOLN COMPARISON:  CT abdomen pelvis, 01/22/2024 FINDINGS: Lower chest: No acute abnormality. Hepatobiliary: No solid liver abnormality is seen. No gallstones, gallbladder wall thickening, or biliary dilatation. Pancreas: Diffuse inflammatory fat stranding about the pancreas. Severe pancreatic ductal dilatation measuring up to 1.0 cm in caliber in the pancreatic neck, the pancreatic duct effaced within the central pancreatic head (series 3, image 22). Interval development of an elongated fluid collection superior to the pancreatic body and tail and adjacent to the lesser  curvature of the stomach, measuring 6.9 x 2.3 x 3.0 cm (series 3, image 18, series 2, image 22). Unchanged heterogeneous fluid collection ventral to the pancreatic head, involving the gastric antrum and pylorus measuring 3.0 x 2.1 x 3.0 cm (series 3, image 23, series 2, image 22) Spleen: Normal in size without significant abnormality. Adrenals/Urinary Tract: Adrenal glands are unremarkable. Kidneys are normal, without renal calculi, solid lesion, or hydronephrosis. Stomach/Bowel: Diverticulum of the gastric fundus. Very extensive, severe inflammatory fat stranding and wall thickening of the gastric antrum, pylorus, and proximal duodenal with associated complex fluid collection as described above (series 3, image 25). No evidence of bowel wall thickening, distention, or inflammatory changes. Vascular/Lymphatic: No significant vascular findings are present. No enlarged abdominal lymph nodes. Other: No abdominal wall hernia or abnormality. Small volume ascites. Musculoskeletal: No acute or significant osseous findings. IMPRESSION: 1. Diffuse inflammatory fat stranding about the pancreas, consistent with acute pancreatitis. 2. Severe pancreatic ductal dilatation measuring up to 1.0 cm in caliber in the pancreatic neck, the pancreatic duct effaced within the central pancreatic head. 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 3. Since earlier same day examination, interval development of an elongated acute pancreatic fluid collection superior to the pancreatic body and tail and adjacent to the lesser curvature of the stomach, measuring 6.9 x 2.3 x 3.0 cm. 4. Unchanged heterogeneous fluid collection ventral to the pancreatic head, involving the severely inflamed gastric antrum and pylorus measuring 3.0 x 2.1 x 3.0 cm, consistent with acute pancreatic fluid collection and concerning for ulceration to the bowel lumen given size, complexity, and significant involvement of the bowel wall. 5. Presence or  absence of infection within the above described fluid collections is not established by imaging. 6. Small volume ascites. Electronically Signed   By: Marolyn JONETTA Jaksch M.D.   On: 01/23/2024 22:08   CT ABDOMEN PELVIS W CONTRAST Result Date: 01/22/2024 CLINICAL DATA:  Persistent acute on chronic pancreatitis, recurrent nausea and gastric distension EXAM: CT ABDOMEN AND PELVIS WITH CONTRAST TECHNIQUE: Multidetector CT imaging of the abdomen and pelvis was performed using the standard protocol following bolus administration of intravenous contrast. RADIATION DOSE REDUCTION: This exam was performed according to the departmental dose-optimization program which includes automated exposure control, adjustment of the mA and/or kV according to patient size and/or use of iterative reconstruction technique. CONTRAST:  75mL OMNIPAQUE  IOHEXOL  350 MG/ML SOLN COMPARISON:  CT abdomen  pelvis January 11, 2024 FINDINGS: Lower chest: No suspicious nodule. Sternotomy. No pleural effusion. Hepatobiliary: Focal fat in segment 4 typical location. Subhepatic fluid collection is slightly decreased to prior. Diminished fat stranding and fluid collection in porta hepatis. Gallbladder is unremarkable.  No intrahepatic biliary dilatation. Pancreas: Again seen there is significant dilation of the pancreatic duct up to 10.6 mm, similar to prior. Multiple foci of coarse calcifications within the pancreatic head suggestive of chronic pancreatitis. Diffuse enhancement throughout the pancreas without filling defect. Peripancreatic fat stranding and fluid collection extending to perigastric is similar to slightly improved to prior. There is a rim enhancing hour glass shaped low-density/multiloculated cystic structure measuring 3.6 x 2.1 cm along the anterior aspect of the pancreatic head extending toward the posterior aspect of gastric antrum. Extension to gastric wall cannot be excluded on current exam. On review of prior images this finding was barely  visible and was measured approximately 1 cm. (3/35, 33) Spleen: Normal spleen. Adrenals/Urinary Tract: Adrenal glands are unremarkable. No hydronephrosis. Both kidneys are appearing normal. Under distended otherwise unremarkable bladder. Stomach and bowel: Interval placement of gastrojejunostomy enteric tube tip terminating in proximal jejunal loops. Stomach is not distended. Perigastric inflammatory changes and fluid collection slightly improved to prior. Inflammatory changes and fat stranding identified along the omentum. Small and large bowel loops are unremarkable. Appendix is normal. Vascular/Lymphatic/ascites: Trace free fluid in pelvic cavity and perihepatic, decreased to prior. Persistent mild narrowing of the splenic vein just proximal to the confluence, otherwise patent. Portal vein is patent. Remainder of the vascular structures are unremarkable without narrowing or obstruction. No suspicious lymphadenopathy. Reproductive: Unremarkable prostate. Musculoskeletal: No suspicious osseous lesions. IMPRESSION: 1. Dilation of the pancreatic duct and coarse calcifications in the pancreatic head consistent with stigmata of chronic pancreatitis, similar to prior. Normal pancreatic enhancement. 2. Interval increase in size of a multiloculated fluid collection with rim enhancement anterior to the pancreatic neck extending to posterior wall of the gastric antrum , most consistent with peripancreatic abscess formation. Gastric wall connection cannot be excluded. Recommend future follow-ups with dedicated MRCP with contrast if clinically warranted. 3.  Decreased ascites and perigastric/perihepatic free fluid. 4. Gastrojejunostomy tube terminates in proximal jejunum. Normal gastric size. Electronically Signed   By: Megan  Zare M.D.   On: 01/22/2024 15:01   DG Abd 2 Views Result Date: 01/21/2024 CLINICAL DATA:  Nausea.  Left-sided abdominal pain. EXAM: ABDOMEN - 2 VIEW COMPARISON:  Radiographs 01/15/2024.  CT  01/11/2024. FINDINGS: The stomach is distended with ingested material and fluid. Enteric tube follows a course consistent with tip near the ligament of Treitz. No significant bowel distension. No evidence of pneumoperitoneum. Pancreatic calcifications are again noted consistent with chronic pancreatitis. The bones appear unchanged. IMPRESSION: The stomach is distended with ingested material and fluid. No evidence of bowel obstruction or pneumoperitoneum. Electronically Signed   By: Elsie Perone M.D.   On: 01/21/2024 13:59   DG Abd 1 View Result Date: 01/15/2024 CLINICAL DATA:  Feeding tube adjustment in the radiology department. EXAM: ABDOMEN - 1 VIEW COMPARISON:  01/15/2024. FINDINGS: Feeding tube terminates at the ligament of Treitz. Contrast was injected and position confirmed. IMPRESSION: Satisfactory feeding tube position. Electronically Signed   By: Newell Eke M.D.   On: 01/15/2024 14:57   DG Abd Portable 1V Result Date: 01/15/2024 CLINICAL DATA:  Feeding tube placement EXAM: PORTABLE ABDOMEN - 1 VIEW COMPARISON:  None Available. FINDINGS: Enteric tube tip terminates in gastric antrum. The bowel gas pattern is normal. No radio-opaque calculi  or other significant radiographic abnormality are seen. Sternotomy wires identified. IMPRESSION: Enteric tube tip in gastric antrum. Electronically Signed   By: Megan  Zare M.D.   On: 01/15/2024 11:40    Labs:  CBC: Recent Labs    02/05/24 0332 02/06/24 0500 02/07/24 0344 02/12/24 0146  WBC 6.1 5.7 6.7 7.2  HGB 10.0* 10.0* 10.1* 11.1*  HCT 30.9* 30.3* 30.8* 33.8*  PLT 270 300 299 333    COAGS: Recent Labs    02/09/24 0314 02/10/24 0310 02/11/24 0324 02/12/24 0146  INR 1.8* 2.0* 2.0* 1.7*    BMP: Recent Labs    02/02/24 0551 02/03/24 0500 02/04/24 0417 02/06/24 0500  NA 138 136 135 138  K 4.2 4.0 4.0 4.0  CL 104 103 101 103  CO2 27 27 26 26   GLUCOSE 144* 133* 146* 135*  BUN 10 12 14 18   CALCIUM  9.0 9.0 9.1 9.0   CREATININE 0.55* 0.54* 0.52* 0.53*  GFRNONAA >60 >60 >60 >60    LIVER FUNCTION TESTS: Recent Labs    01/29/24 0209 01/30/24 0500 02/03/24 0500 02/06/24 0500  BILITOT 0.5 0.6 0.2 0.3  AST 14* 15 29 22   ALT 16 15 34 27  ALKPHOS 83 85 134* 103  PROT 6.4* 6.3* 6.8 6.5  ALBUMIN  3.2* 3.2* 3.2* 3.2*    TUMOR MARKERS: Recent Labs    08/26/23 1436  CA199 6    Assessment and Plan:  Request for  image guided GJ tube placement reviewed by Dr. Jennefer; approved for GJ from anatomy standpoint.   Warfarin has been appropriately held, but ASA hold ( last taken 9/2 in am) would place procedure out to Monday. Risks of bleeding associated with not completing ASA hold was discussed with the patient and patient family; they understand and agree to these risks, request that GJ be completed as soon as possible. Based on this request, Dr. Jennefer plans to complete this on 9 in afternoon.   Patient aware that he will need to be NPO and tube feedings held starting MN tonight. Orders in place as well.   No contraindications for procedure identified in ROS, physical exam, or review of pre-sedation considerations. INR most recently 1.7. Will be rechecked morning of procedure for goal 1.5 or below.  8/21 CT imaging available and reviewed VSS, afebrile Abx- ancef  day of procedure   Risks and benefits image guided gastrostomy tube placement was discussed with the patient including, but not limited to the need for a barium enema during the procedure, bleeding (increased risk due to ASA), infection, peritonitis and/or damage to adjacent structures.  All of the patient's questions were answered, patient is agreeable to proceed.  Consent signed and in chart.   Thank you for allowing our service to participate in [name] [name] 's care.    Electronically Signed: Laymon Coast, NP   02/12/2024, 3:36 PM     I spent a total of 20 Minutes    in face to face in clinical consultation,  greater than 50% of which was counseling/coordinating care for image guided GJ placement.   (A copy of this note was sent to the referring provider and the time of visit.)

## 2024-02-12 NOTE — Plan of Care (Signed)

## 2024-02-13 ENCOUNTER — Inpatient Hospital Stay (HOSPITAL_COMMUNITY)

## 2024-02-13 DIAGNOSIS — K859 Acute pancreatitis without necrosis or infection, unspecified: Secondary | ICD-10-CM | POA: Diagnosis not present

## 2024-02-13 DIAGNOSIS — K861 Other chronic pancreatitis: Secondary | ICD-10-CM | POA: Diagnosis not present

## 2024-02-13 HISTORY — PX: IR GASTR TUBE CONVERT GASTR-JEJ PER W/FL MOD SED: IMG2332

## 2024-02-13 HISTORY — PX: IR GASTROSTOMY TUBE MOD SED: IMG625

## 2024-02-13 LAB — CBC
HCT: 34 % — ABNORMAL LOW (ref 39.0–52.0)
Hemoglobin: 11.1 g/dL — ABNORMAL LOW (ref 13.0–17.0)
MCH: 28.5 pg (ref 26.0–34.0)
MCHC: 32.6 g/dL (ref 30.0–36.0)
MCV: 87.4 fL (ref 80.0–100.0)
Platelets: 338 K/uL (ref 150–400)
RBC: 3.89 MIL/uL — ABNORMAL LOW (ref 4.22–5.81)
RDW: 14.1 % (ref 11.5–15.5)
WBC: 7.8 K/uL (ref 4.0–10.5)
nRBC: 0 % (ref 0.0–0.2)

## 2024-02-13 LAB — GLUCOSE, CAPILLARY
Glucose-Capillary: 109 mg/dL — ABNORMAL HIGH (ref 70–99)
Glucose-Capillary: 83 mg/dL (ref 70–99)
Glucose-Capillary: 96 mg/dL (ref 70–99)

## 2024-02-13 LAB — PROTIME-INR
INR: 1.3 — ABNORMAL HIGH (ref 0.8–1.2)
Prothrombin Time: 16.7 s — ABNORMAL HIGH (ref 11.4–15.2)

## 2024-02-13 MED ORDER — ONDANSETRON HCL 4 MG/2ML IJ SOLN
INTRAMUSCULAR | Status: AC | PRN
  Administered 2024-02-13: 4 mg via INTRAVENOUS

## 2024-02-13 MED ORDER — LIDOCAINE-EPINEPHRINE 1 %-1:100000 IJ SOLN
INTRAMUSCULAR | Status: AC
  Filled 2024-02-13: qty 1

## 2024-02-13 MED ORDER — DEXTROSE 50 % IV SOLN: 12.5000 g | INTRAVENOUS | Status: DC

## 2024-02-13 MED ORDER — GLUCAGON HCL RDNA (DIAGNOSTIC) 1 MG IJ SOLR
INTRAMUSCULAR | Status: AC
  Filled 2024-02-13: qty 1

## 2024-02-13 MED ORDER — IOHEXOL 300 MG/ML  SOLN
50.0000 mL | Freq: Once | INTRAMUSCULAR | Status: AC | PRN
  Administered 2024-02-13: 45 mL

## 2024-02-13 MED ORDER — IOHEXOL 300 MG/ML  SOLN
50.0000 mL | Freq: Once | INTRAMUSCULAR | Status: AC | PRN
  Administered 2024-02-13: 15 mL

## 2024-02-13 MED ORDER — HEPARIN (PORCINE) 25000 UT/250ML-% IV SOLN
1100.0000 [IU]/h | INTRAVENOUS | Status: DC
  Administered 2024-02-13: 900 [IU]/h via INTRAVENOUS
  Administered 2024-02-14: 950 [IU]/h via INTRAVENOUS
  Filled 2024-02-13 (×2): qty 250

## 2024-02-13 MED ORDER — CEFAZOLIN SODIUM-DEXTROSE 2-4 GM/100ML-% IV SOLN
INTRAVENOUS | Status: AC | PRN
  Administered 2024-02-13: 2 g via INTRAVENOUS

## 2024-02-13 MED ORDER — GLUCAGON HCL (RDNA) 1 MG IJ SOLR
INTRAMUSCULAR | Status: AC | PRN
  Administered 2024-02-13: .5 mg via INTRAVENOUS

## 2024-02-13 MED ORDER — LIDOCAINE-EPINEPHRINE 1 %-1:100000 IJ SOLN
20.0000 mL | Freq: Once | INTRAMUSCULAR | Status: AC
  Administered 2024-02-13: 16 mL via INTRADERMAL

## 2024-02-13 MED ORDER — ONDANSETRON HCL 4 MG/2ML IJ SOLN
INTRAMUSCULAR | Status: AC
  Filled 2024-02-13: qty 2

## 2024-02-13 MED ORDER — OXYCODONE-ACETAMINOPHEN 5-325 MG PO TABS
2.0000 | ORAL_TABLET | ORAL | Status: DC | PRN
  Administered 2024-02-13 – 2024-02-17 (×11): 2
  Filled 2024-02-13 (×10): qty 2

## 2024-02-13 MED ORDER — LIDOCAINE VISCOUS HCL 2 % MT SOLN
OROMUCOSAL | Status: AC
  Filled 2024-02-13: qty 15

## 2024-02-13 MED ORDER — SODIUM CHLORIDE 0.9 % IV SOLN: INTRAVENOUS | Status: AC

## 2024-02-13 MED ORDER — MIDAZOLAM HCL 2 MG/2ML IJ SOLN
INTRAMUSCULAR | Status: AC | PRN
  Administered 2024-02-13: 1 mg via INTRAVENOUS
  Administered 2024-02-13: .5 mg via INTRAVENOUS
  Administered 2024-02-13: 1 mg via INTRAVENOUS
  Administered 2024-02-13: .5 mg via INTRAVENOUS
  Administered 2024-02-13: 1 mg via INTRAVENOUS

## 2024-02-13 MED ORDER — LIDOCAINE VISCOUS HCL 2 % MT SOLN
15.0000 mL | Freq: Once | OROMUCOSAL | Status: AC
  Administered 2024-02-13: 8 mL via OROMUCOSAL
  Filled 2024-02-13: qty 15

## 2024-02-13 MED ORDER — CEFAZOLIN SODIUM-DEXTROSE 2-4 GM/100ML-% IV SOLN
INTRAVENOUS | Status: AC
  Filled 2024-02-13: qty 100

## 2024-02-13 MED ORDER — POLYETHYLENE GLYCOL 3350 17 G PO PACK
17.0000 g | PACK | Freq: Three times a day (TID) | ORAL | Status: DC
  Administered 2024-02-13 – 2024-02-16 (×2): 17 g
  Filled 2024-02-13 (×4): qty 1

## 2024-02-13 MED ORDER — FENTANYL CITRATE (PF) 100 MCG/2ML IJ SOLN
INTRAMUSCULAR | Status: AC
Start: 2024-02-13 — End: 2024-02-13
  Filled 2024-02-13: qty 4

## 2024-02-13 MED ORDER — DEXTROSE 50 % IV SOLN: 12.5000 g | INTRAVENOUS | Status: DC | PRN

## 2024-02-13 MED ORDER — FENTANYL CITRATE (PF) 100 MCG/2ML IJ SOLN
INTRAMUSCULAR | Status: AC | PRN
  Administered 2024-02-13 (×2): 50 ug via INTRAVENOUS
  Administered 2024-02-13 (×2): 25 ug via INTRAVENOUS
  Administered 2024-02-13: 50 ug via INTRAVENOUS

## 2024-02-13 MED ORDER — MIDAZOLAM HCL 2 MG/2ML IJ SOLN
INTRAMUSCULAR | Status: AC
  Filled 2024-02-13: qty 4

## 2024-02-13 NOTE — Procedures (Signed)
 Vascular and Interventional Radiology Procedure Note  Patient: Jeshurun Raymond Rexford Laba DOB: [birth year] Medical Record Number: [id number] Note Date/Time: 02/13/24 11:01 AM   Performing Physician: Thom Hall, MD Assistant(s): None  Diagnosis: Pancreatitis. DHT-dependent  Procedure: PERCUTANEOUS GASTROJEJUNOSTOMY TUBE PLACEMENT  Anesthesia: Conscious Sedation Complications: None Estimated Blood Loss: Minimal  Findings:  Successful placement of a 34F gastrojejunostomy  tube under fluoroscopy.  Plan: OK to use tube for medications immediately. TFs can begin at trickle in 4 hrs. Routine feeding tube exchange in 6 mos.   See detailed procedure note with images in PACS. The patient tolerated the procedure well without incident or complication and was returned to Recovery in stable condition.    Thom Hall, MD [facility]   Pager. [phone] Clinic. [phone]

## 2024-02-13 NOTE — Plan of Care (Signed)
   Problem: Education: Goal: Knowledge of General Education information will improve Description Including pain rating scale, medication(s)/side effects and non-pharmacologic comfort measures Outcome: Progressing

## 2024-02-13 NOTE — Progress Notes (Signed)
 Nutrition Note  RD working remotely. RN communicated via secure chat that pt and mother were wanting to speak to this RD. Pt currently receiving PEG/J tube and mother is out of room. Mother's number provided to this RD by nursing. Attempted to call mother's number, straight to voicemail. Will follow up with pt/mother tomorrow in person.   [name] [name], RDN, LDN Registered Dietitian Nutritionist RD Inpatient Contact Info in [place]

## 2024-02-13 NOTE — Progress Notes (Signed)
 0000 NPO starting at [facility] for OR tomorrow, per MD order.  Pt informed/reminded to keep NPO,  Understanding verbalized.

## 2024-02-13 NOTE — Progress Notes (Signed)
 PROGRESS NOTE    [name] [name]  FMW:[id number] DOB: 22-Jul-1990 DOA: 01/11/2024 PCP: Lucius Krabbe, NP   Brief Narrative:  Patient is a 33 year old male with past medical history significant for hypertension, s/p mechanical aortic valve replacement 09/27/21 on chronic warfarin anticoagulation, recurrent pancreatitis with multiple hospitalizations, asthma, GERD, prior history of alcohol use disorder (abstinent for 10 months PTA).  Patient presented to the hospital on 01/11/2024 with complaints of abdominal pain, diagnosed with pancreatitis, with pseudocyst and acute kidney injury.  CT A/P done on 8/18 revealed gastric pneumatosis, general surgery was consulted, octreotide  stopped.  Patient was unable to tolerate NG tube placement, made n.p.o. again, PICC line was placed and patient was started on TPN on 8/20.  CT abdomen pelvis on 01/30/2024 revealed resolution of the pneumatosis.  General surgery and GI was following.  Patient did not want to go home with TPN, he would rather have J-tube placed before discharge.  IR was involved, now plan for GJ tube today.  Assessment & Plan:   Principal Problem:   Acute on chronic pancreatitis [facility]) Active Problems:   Pseudocyst of pancreas   S/P AVR (aortic valve replacement) and aortoplasty   Essential hypertension   On pre-exposure prophylaxis for HIV   History of asthma   History of alcohol abuse   Protein-calorie malnutrition, severe   Intraabdominal fluid collection  Acute on chronic pancreatitis, recurrent pancreatitis Acute fluid collection/pseudocyst Gastric pneumatosis -As per chart review, EUS by Dr. Wilhelmenia in 07/2023 that showed cystic lesion at the head of the pancreas consistent with a pseudocyst and a 5 mm stone within the pancreatic duct at the junction of the head/neck.   -Cytology of the cyst was benign.  He was scheduled for an ERCP with attempted stone extraction in September with Dr. Wilhelmenia but now admitted  with abdominal pain. -[facility] was consulted, patient was treated supportively with n.p.o., IVF, core track with postpyloric feeding (initially started on 8/6).  Core track was dislodged on 8/18, briefly treated with liquid diet and core track was replaced.   -CT A/P on 8/18 showed gastric pneumatosis, general surgery was consulted, octreotide  stopped (to avoid dysmotility), patient was unable to tolerate NG tube placement, made n.p.o. again/tube feeds held, gentle IVF continuing, PICC line placed and TPN started on 01/29/2024. -Pneumatosis has resolved.  Cortrack tube has been removed.  Patient was tolerating heart healthy diet however was having intermittent nausea and vomiting.  Could not meet nutritional goals.  He did not want to go home with TPN.  General surgery consulted 01/27/2024, they thought it would be too risky to do percutaneous GJ or J-tube.  They started discussing with IR 02/04/2024, IR recommended placing NJ/postpyloric tube to help with nutrition and consider for percutaneous tube later on. Patient had his postpyloric tube placed by IR 02/06/2024, tube feeds started, TPN weaned off 02/07/2024.  Tolerating tube feeds very well.  Awaited for general surgery and IR to make a final decision through the whole Labor Day weekend.  Eventually, IR made a final plan to place GJ however patient is on Coumadin  and IR wants him to be off of Coumadin  and INR to be less than 1.5 and Coumadin  need to be held for 4 days and then they will do procedure but unfortunately that would lead him to the weekend and per IR, they would not do the procedure on the weekends since it is elective and they are planning to do the procedure on Monday.  Patient's mother and  patient were very frustrated about extended hospitalization.  Hospital leadership was involved.  Discussed with IR, they discussed with the patient about risks of proceeding with the procedure earlier and patient agreed to the risks and after that, IR has now  scheduled him to have GJ tube placed today.  Patient's last Coumadin  was on 02/10/2024, he will be bridged with heparin  once INR is subtherapeutic.  He was complaining of abdominal pain radiating to the back yesterday which was ongoing for 3 days for which, I repeated CT abdomen pelvis with contrast which actually showed that the previous pseudocyst had improved in size but there is a new pseudocyst but there is no compression.  Once again shows acute on chronic pancreatitis as well as duodenitis/gastritis.  Patient is already on Protonix  40 mg IV twice daily.  His abdominal pain is much better today.   S/p mechanical aortic valve replacement and aortoplasty -INR therapeutic.  Patient's goal INR is 1.5-2.  Coumadin  on hold as mentioned above.  Aspirin  also on hold per IR recommendations.  They both will need to be resumed once cleared by IR. -Follows with Dr. Wendel, Cardiology and the Coumadin  Clinic   Essential hypertension - Blood pressure on the low side and so is the heart rate.  I discontinued Toprol -XL low-dose 12.5 mg that he takes at night and monitor for now.   Alcohol use disorder, in remission Stable   Asthma Stable   Anemia of Chronic Disease Stable.   Small intestinal bacterial overgrowth (SIBO) Completed 14 days of rifaximin .  Resolved.   Nutrition Status: Nutrition Problem: Severe Malnutrition Calorie count to see progress.   Tolerating clear liquids. Continue TPN. TPN on discharge.. Body mass index is 20.57 kg/m.  DVT prophylaxis: Coumadin /Lovenox    Code Status: Full Code  Family Communication: Mother present at bedside.  Plan of care discussed with patient in length and he/she verbalized understanding and agreed with it.  Status is: Inpatient Remains inpatient appropriate because: Plan for GJ-tube today by IR   Estimated body mass index is 20.26 kg/m as calculated from the following:   Height as of this encounter: 5' 11 (1.803 m).   Weight as of this  encounter: 65.9 kg.    Nutritional Assessment: Body mass index is 20.26 kg/m.SABRA Seen by dietician.  I agree with the assessment and plan as outlined below: Nutrition Status: Nutrition Problem: Severe Malnutrition Etiology: vomiting, chronic illness, altered GI function Signs/Symptoms: moderate fat depletion, severe muscle depletion Interventions: Refer to RD note for recommendations  . Skin Assessment: I have examined the patient's skin and I agree with the wound assessment as performed by the wound care RN as outlined below:    Consultants:  GI and general surgery  Procedures:  As above  Antimicrobials:  Anti-infectives (From admission, onward)    Start     Dose/Rate Route Frequency Ordered Stop   02/13/24 1101  ceFAZolin  (ANCEF ) IVPB 2g/100 mL premix        over 30 Minutes Intravenous Continuous PRN 02/13/24 1101     02/13/24 0800  ceFAZolin  (ANCEF ) IVPB 2g/100 mL premix        2 g 200 mL/hr over 30 Minutes Intravenous To Radiology 02/12/24 1532 02/13/24 0836   01/18/24 1230  rifaximin  (XIFAXAN ) tablet 550 mg  Status:  Discontinued        550 mg Oral 3 times daily 01/18/24 1132 01/26/24 1323         Subjective: Seen and examined, mother at the bedside.  Patient  is very happy that finally his procedure is being done today instead of Monday.  His abdominal pain is improved.  He has no other complaint.  No questions today.  Objective: Vitals:   02/13/24 0347 02/13/24 0500 02/13/24 0822 02/13/24 1101  BP: 131/88  133/84 (!) 126/92  Pulse: 87  78 94  Resp:    18  Temp:   98.2 F (36.8 C)   TempSrc:      SpO2: 98%  98% 100%  Weight:  65.9 kg    Height:        Intake/Output Summary (Last 24 hours) at 02/13/2024 1105 Last data filed at 02/12/2024 2300 Gross per 24 hour  Intake 300 ml  Output --  Net 300 ml   Filed Weights   02/11/24 0754 02/12/24 0500 02/13/24 0500  Weight: 65.5 kg 65.9 kg 65.9 kg    Examination:  General exam: Appears calm and comfortable   Respiratory system: Clear to auscultation. Respiratory effort normal. Cardiovascular system: S1 & S2 heard, RRR. No JVD, murmurs, rubs, gallops or clicks. No pedal edema. Gastrointestinal system: Abdomen is nondistended, soft and nontender. No organomegaly or masses felt. Normal bowel sounds heard. Central nervous system: Alert and oriented. No focal neurological deficits. Extremities: Symmetric 5 x 5 power. Skin: No rashes, lesions or ulcers.  Psychiatry: Judgement and insight appear normal. Mood & affect appropriate.    Data Reviewed: I have personally reviewed following labs and imaging studies  CBC: Recent Labs  Lab 02/07/24 0344 02/12/24 0146 02/13/24 0145  WBC 6.7 7.2 7.8  HGB 10.1* 11.1* 11.1*  HCT 30.8* 33.8* 34.0*  MCV 87.3 86.0 87.4  PLT 299 333 338   Basic Metabolic Panel: No results for input(s): NA, K, CL, CO2, GLUCOSE, BUN, CREATININE, CALCIUM , MG, PHOS in the last 168 hours.  GFR: Estimated Creatinine Clearance: 122.4 mL/min (A) (by C-G formula based on SCr of 0.53 mg/dL (L)). Liver Function Tests: No results for input(s): AST, ALT, ALKPHOS, BILITOT, PROT, ALBUMIN  in the last 168 hours.  No results for input(s): LIPASE, AMYLASE in the last 168 hours. No results for input(s): AMMONIA in the last 168 hours. Coagulation Profile: Recent Labs  Lab 02/09/24 0314 02/10/24 0310 02/11/24 0324 02/12/24 0146 02/13/24 0145  INR 1.8* 2.0* 2.0* 1.7* 1.3*   Cardiac Enzymes: No results for input(s): CKTOTAL, CKMB, CKMBINDEX, TROPONINI in the last 168 hours. BNP (last 3 results) No results for input(s): PROBNP in the last 8760 hours. HbA1C: No results for input(s): HGBA1C in the last 72 hours. CBG: Recent Labs  Lab 02/12/24 0634 02/12/24 1321 02/12/24 1647 02/13/24 0048 02/13/24 0658  GLUCAP 151* 126* 128* 109* 96   Lipid Profile: No results for input(s): CHOL, HDL, LDLCALC, TRIG, CHOLHDL,  LDLDIRECT in the last 72 hours.  Thyroid Function Tests: No results for input(s): TSH, T4TOTAL, FREET4, T3FREE, THYROIDAB in the last 72 hours. Anemia Panel: No results for input(s): VITAMINB12, FOLATE, FERRITIN, TIBC, IRON, RETICCTPCT in the last 72 hours. Sepsis Labs: No results for input(s): PROCALCITON, LATICACIDVEN in the last 168 hours.  No results found for this or any previous visit (from the past 240 hours).   Radiology Studies: CT ABDOMEN PELVIS W CONTRAST Result Date: 02/12/2024 CLINICAL DATA:  Acute severe pancreatitis. EXAM: CT ABDOMEN AND PELVIS WITH CONTRAST TECHNIQUE: Multidetector CT imaging of the abdomen and pelvis was performed using the standard protocol following bolus administration of intravenous contrast. RADIATION DOSE REDUCTION: This exam was performed according to the departmental dose-optimization program which includes  automated exposure control, adjustment of the mA and/or kV according to patient size and/or use of iterative reconstruction technique. CONTRAST:  75mL OMNIPAQUE  IOHEXOL  350 MG/ML SOLN COMPARISON:  CT abdomen and pelvis 01/30/2024. FINDINGS: Lower chest: No acute abnormality. Hepatobiliary: There is dilatation of the intra and extrahepatic bile ducts. No gallstones are identified. The liver is within normal limits. Pancreas: Multiple pancreatic head calcifications are again seen. There is chronic dilatation of the pancreatic duct measuring 9 mm, unchanged. The pancreatic head is diffusely heterogeneous. There is mild inflammatory stranding surrounding the pancreatic head. Previously identified cystic lesion in the head of the pancreas has decreased in size now measuring 1.9 by 0.7 cm (previously 4.1 cm in largest dimension). Spleen: Normal in size without focal abnormality. Adrenals/Urinary Tract: Adrenal glands are unremarkable. Kidneys are normal, without renal calculi, focal lesion, or hydronephrosis. Bladder is unremarkable.  Stomach/Bowel: Enteric tube tip ends in the proximal jejunum. There some wall thickening and mild inflammation surrounding the gastric antrum and duodenal. There is a thick-walled cystic area at the level of the gastric antral wall as it approximates the pancreatic head image 3/31 measuring 2.6 x 1.6 by 1.3 mm. There is no bowel obstruction, pneumatosis or free air. The appendix is within normal limits. Vascular/Lymphatic: Aorta and IVC are normal in size. There some smooth mild tapering of the distal splenic vein, unchanged. Portal vein and splenic vein appear patent. No enlarged lymph nodes are identified. Reproductive: Uterus and bilateral adnexa are unremarkable. Other: No abdominal wall hernia or abnormality. No abdominopelvic ascites. Musculoskeletal: No acute or significant osseous findings. IMPRESSION: 1. Findings compatible with acute on chronic pancreatitis. 2. Previously identified cystic lesion in the head of the pancreas has decreased in size now measuring 1.9 x 0.7 cm (previously 4.1 cm in largest dimension). 3. There is a thick-walled cystic area at the level of the gastric antral wall as it approximates the pancreatic head measuring 2.6 x 1.6 x 1.3 mm. This may represent a pseudocyst. 4. Wall thickening thickening and inflammation of the gastric antrum and duodenal wall compatible with gastritis/duodenitis. 5. New dilatation of the intra and extrahepatic bile ducts. No gallstones are identified. Recommend clinical correlation. 6. Stable smooth mild tapering of the distal splenic vein. Portal vein and splenic vein appear patent. Electronically Signed   By: Greig Pique M.D.   On: 02/12/2024 21:24     Scheduled Meds:  Chlorhexidine  Gluconate Cloth  6 each Topical Daily   free water   25 mL Per Tube Q1H   insulin  aspart  0-9 Units Subcutaneous Q6H   lipase/protease/amylase  72,000 Units Oral TID AC   pantoprazole  (PROTONIX ) IV  40 mg Intravenous Q12H   polyethylene glycol  17 g Oral TID    sodium chloride  flush  3 mL Intravenous Q12H   Continuous Infusions:  ceFAZolin      feeding supplement (OSMOLITE 1.5 CAL) Stopped (02/13/24 0000)     LOS: 33 days   Fredia Skeeter, MD [facility]  02/13/2024, 11:05 AM  Total time spent 45 minutes today talking for the full encounter which includes talking to the family. *Please note that this is a verbal dictation therefore any spelling or grammatical errors are due to the Dragon Medical One system interpretation.  Please page via Amion and do not message via secure chat for urgent patient care matters. Secure chat can be used for non urgent patient care matters.  How to contact the [facility] Attending or Consulting provider 7A - 7P or covering provider during after  hours 7P -7A, for this patient?  Check the care team in [facility] and look for a) attending/consulting [facility] provider listed and b) the [facility] team listed. Page or secure chat 7A-7P. Log into [email or web address] and use 's universal password to access. If you do not have the password, please contact the hospital operator. Locate the [facility] provider you are looking for under [facility] and page to a number that you can be directly reached. If you still have difficulty reaching the provider, please page the [facility] (Director on Call) for the Hospitalists listed on amion for assistance.

## 2024-02-13 NOTE — Plan of Care (Signed)

## 2024-02-13 NOTE — Progress Notes (Addendum)
 PHARMACY - ANTICOAGULATION CONSULT NOTE  Pharmacy Consult for Warfarin Indication: On-X mechanical AVR  Allergies  Allergen Reactions   Amoxicillin Hives   Patient Measurements: Height: 5' 11 (180.3 cm) Weight: 65.9 kg (145 lb 4.5 oz) IBW/kg (Calculated) : 75.3 HEPARIN  DW (KG): 71  Vital Signs: Temp: 98.2 F (36.8 C) (09/04 0822) BP: 125/90 (09/04 1110) Pulse Rate: 100 (09/04 1110)  Labs: Recent Labs    02/11/24 0324 02/12/24 0146 02/13/24 0145  HGB  --  11.1* 11.1*  HCT  --  33.8* 34.0*  PLT  --  333 338  LABPROT 23.8* 20.6* 16.7*  INR 2.0* 1.7* 1.3*   Estimated Creatinine Clearance: 122.4 mL/min (A) (by C-G formula based on SCr of 0.53 mg/dL (L)).  Assessment: 33 yo M presenting with pancreatitis. Warfarin PTA regimen is 6 mg Monday and Thursday and 4.5 mg all other days (total weekly dose: 34.5 mg).    Pharmacy to start heparin  when INR < 1.5 with plans for GJ tube. INR came back low at 1.3 - discussed with IR, given plan for procedure this afternoon will hold on starting heparin  this morning and follow up after procedure. Hgb 11.1, plt 338.   Goal of Therapy:  INR 1.5-2.0 Monitor platelets by anticoagulation protocol: Yes   Plan:  -Continue to hold warfarin  -Follow up after procedure for heparin  start given INR<1.5  ADDENDUM Discussed with Dr Hughes, okay to start heparin  6 hours after GJ tube placement. Will plan to start heparin  without a bolus at 900 units/hr on 9/4@1700  and get level in 6 hours after start. Monitor daily HL, CBC, and for s/sx of bleeding.   Thank you for allowing pharmacy to participate in this patient's care,  Suzen Sour, PharmD, BCCCP Clinical Pharmacist  Phone: [phone] 02/13/2024 11:17 AM  Please check AMION for all [facility] Pharmacy phone numbers After 10:00 PM, call Main Pharmacy [phone]

## 2024-02-14 LAB — CBC
HCT: 34.3 % — ABNORMAL LOW (ref 39.0–52.0)
Hemoglobin: 11.4 g/dL — ABNORMAL LOW (ref 13.0–17.0)
MCH: 28.4 pg (ref 26.0–34.0)
MCHC: 33.2 g/dL (ref 30.0–36.0)
MCV: 85.5 fL (ref 80.0–100.0)
Platelets: 331 K/uL (ref 150–400)
RBC: 4.01 MIL/uL — ABNORMAL LOW (ref 4.22–5.81)
RDW: 13.9 % (ref 11.5–15.5)
WBC: 7.9 K/uL (ref 4.0–10.5)
nRBC: 0 % (ref 0.0–0.2)

## 2024-02-14 LAB — HEPARIN LEVEL (UNFRACTIONATED)
Heparin Unfractionated: 0.59 [IU]/mL (ref 0.30–0.70)
Heparin Unfractionated: <0.1 [IU]/mL — ABNORMAL LOW (ref 0.30–0.70)
Heparin Unfractionated: <0.1 [IU]/mL — ABNORMAL LOW (ref 0.30–0.70)
Heparin Unfractionated: <0.1 [IU]/mL — ABNORMAL LOW (ref 0.30–0.70)

## 2024-02-14 LAB — GLUCOSE, CAPILLARY
Glucose-Capillary: 124 mg/dL — ABNORMAL HIGH (ref 70–99)
Glucose-Capillary: 133 mg/dL — ABNORMAL HIGH (ref 70–99)
Glucose-Capillary: 138 mg/dL — ABNORMAL HIGH (ref 70–99)
Glucose-Capillary: 145 mg/dL — ABNORMAL HIGH (ref 70–99)

## 2024-02-14 LAB — PROTIME-INR
INR: 1.2 (ref 0.8–1.2)
Prothrombin Time: 16.1 s — ABNORMAL HIGH (ref 11.4–15.2)

## 2024-02-14 MED ORDER — METOPROLOL SUCCINATE ER 25 MG PO TB24
12.5000 mg | ORAL_TABLET | Freq: Every day | ORAL | Status: DC
  Administered 2024-02-14 – 2024-02-17 (×4): 12.5 mg via ORAL
  Filled 2024-02-14 (×4): qty 1

## 2024-02-14 MED ORDER — OSMOLITE 1.5 CAL PO LIQD
1680.0000 mL | Freq: Every day | ORAL | Status: DC
  Administered 2024-02-14 – 2024-02-16 (×3): 1680 mL
  Filled 2024-02-14 (×4): qty 2000

## 2024-02-14 MED ORDER — FREE WATER
100.0000 mL | Freq: Every day | Status: DC
  Administered 2024-02-14 – 2024-02-17 (×15): 100 mL

## 2024-02-14 MED ORDER — HEPARIN BOLUS VIA INFUSION
3000.0000 [IU] | Freq: Once | INTRAVENOUS | Status: AC
  Administered 2024-02-14: 3000 [IU] via INTRAVENOUS
  Filled 2024-02-14: qty 3000

## 2024-02-14 NOTE — Plan of Care (Signed)

## 2024-02-14 NOTE — Progress Notes (Signed)
 PHARMACY - ANTICOAGULATION CONSULT NOTE  Pharmacy Consult for Warfarin + heparin   Indication: On-X mechanical AVR  Allergies  Allergen Reactions   Amoxicillin Hives   Patient Measurements: Height: 5' 11 (180.3 cm) Weight: 65.9 kg (145 lb 4.5 oz) IBW/kg (Calculated) : 75.3 HEPARIN  DW (KG): 71  Vital Signs: Temp: 98.5 F (36.9 C) (09/04 2019) Temp Source: Oral (09/04 1235) BP: 133/93 (09/04 2019) Pulse Rate: 102 (09/04 2019)  Labs: Recent Labs    02/11/24 0324 02/12/24 0146 02/13/24 0145 02/13/24 2349  HGB  --  11.1* 11.1*  --   HCT  --  33.8* 34.0*  --   PLT  --  333 338  --   LABPROT 23.8* 20.6* 16.7*  --   INR 2.0* 1.7* 1.3*  --   HEPARINUNFRC  --   --   --  0.59   Estimated Creatinine Clearance: 122.4 mL/min (A) (by C-G formula based on SCr of 0.53 mg/dL (L)).  Assessment: 33 yo M presenting with pancreatitis. Warfarin PTA regimen is 6 mg Monday and Thursday and 4.5 mg all other days (total weekly dose: 34.5 mg).  Pharmacy to start heparin  when INR < 1.5 and 6 hrs after GJ tube placement.  Heparin  level 0.59 is therapeutic on 900 units/hr. Level draw 6 hrs from starting heparin .  Goal of Therapy:  INR 1.5-2.0 Heparin  level 0.3 -0.7 IU/mL Monitor platelets by anticoagulation protocol: Yes   Plan:  Decrease heparin  slightly to 850 units/hr to keep in goal F/u restart warfarin  Monitor daily INR, heparin  level, CBC, signs/symptoms of bleeding    Thank you for allowing pharmacy to participate in this patient's care,  Jinnie Door, PharmD, BCPS, [facility] Clinical Pharmacist  Please check AMION for all [facility] Pharmacy phone numbers After 10:00 PM, call Main Pharmacy [phone]

## 2024-02-14 NOTE — Progress Notes (Signed)
 PHARMACY - ANTICOAGULATION CONSULT NOTE  Pharmacy Consult for Warfarin + heparin   Indication: On-X mechanical AVR  Allergies  Allergen Reactions   Amoxicillin Hives   Patient Measurements: Height: 5' 11 (180.3 cm) Weight: 65.9 kg (145 lb 4.5 oz) IBW/kg (Calculated) : 75.3 HEPARIN  DW (KG): 71  Vital Signs: Temp: 98.1 F (36.7 C) (09/05 0426) Temp Source: Oral (09/05 0426) BP: 136/87 (09/05 0426) Pulse Rate: 90 (09/05 0426)  Labs: Recent Labs    02/12/24 0146 02/13/24 0145 02/13/24 2349 02/14/24 0413  HGB 11.1* 11.1*  --  11.4*  HCT 33.8* 34.0*  --  34.3*  PLT 333 338  --  331  LABPROT 20.6* 16.7*  --  16.1*  INR 1.7* 1.3*  --  1.2  HEPARINUNFRC  --   --  0.59 <0.10*   Estimated Creatinine Clearance: 122.4 mL/min (A) (by C-G formula based on SCr of 0.53 mg/dL (L)).  Assessment: 33 yo M presenting with pancreatitis. Warfarin PTA regimen is 6 mg Monday and Thursday and 4.5 mg all other days (total weekly dose: 34.5 mg).  Pharmacy to start heparin  when INR < 1.5 and 6 hrs after GJ tube placement.  Heparin  level undetectable on 850 units/hr, but level drawn too soon (at about 3 and 1/2 hours). Will keep dose at 850 units/hr and redrawlevel - 6 - 8 hrs from starting heparin .  Goal of Therapy:  INR 1.5-2.0 Heparin  level 0.3 -0.7 IU/mL Monitor platelets by anticoagulation protocol: Yes   Plan:  Redraw heparin  level at 0800  F/u restart warfarin  Monitor daily INR, heparin  level, CBC, signs/symptoms of bleeding    Thank you for allowing pharmacy to participate in this patient's care,  Donny Alert, PharmD, [facility] Clinical Pharmacist Please see AMION for all Pharmacists' Contact Phone Numbers 02/14/2024, 7:26 AM   Please check AMION for all [facility] Pharmacy phone numbers After 10:00 PM, call Main Pharmacy [phone]

## 2024-02-14 NOTE — TOC Progression Note (Signed)
 Transition of Care [facility]) - Progression Note    Patient Details  Name: [name] [name] MRN: [id number] Date of Birth: 03/20/1991  Transition of [facility]) CM/SW Contact  [name] JONELLE Joe, RN Phone Number: 02/14/2024, 12:23 PM  Clinical Narrative:    CM met with the patient and mother at the bedside and patient's GJ tube was placed and is now covered with gauze dressing. Holley Herring, RNCM with [employer] will come to the bedside today for teaching the patient and family tube feeding pump and equipment.  I asked RD to provide patient's final enteral feeds presciption today so that Holley Herring, RNCM with [employer] can get order for MD to sign and start insurance authorization to delivery of feeds for home - for patient's discharge to home Monday.                     Expected Discharge Plan and Services                                               Social Drivers of Health (SDOH) Interventions SDOH Screenings   Food Insecurity: No Food Insecurity (01/11/2024)  Housing: Low Risk  (01/11/2024)  Transportation Needs: No Transportation Needs (01/11/2024)  Utilities: Not At Risk (01/11/2024)  Depression (PHQ2-9): Medium Risk (09/09/2023)  Financial Resource Strain: Low Risk  (04/25/2023)  Physical Activity: Sufficiently Active (04/25/2023)  Social Connections: Unknown (04/25/2023)  Stress: No Stress Concern Present (04/25/2023)  Tobacco Use: Low Risk  (01/20/2024)    Readmission Risk Interventions    01/29/2024   12:53 PM 01/15/2024   11:03 AM  Readmission Risk Prevention Plan  Post Dischage Appt  Complete  Medication Screening  Complete  Transportation Screening Complete Complete  PCP or Specialist Appt within 5-7 Days Complete   Home Care Screening Complete   Medication Review (RN CM) Complete

## 2024-02-14 NOTE — Progress Notes (Signed)
 Nutrition Follow-up  DOCUMENTATION CODES:   Severe malnutrition in context of chronic illness  INTERVENTION:  -Change enteral nutrition to match pt's home regimen of:  Osmolite 1.5 continuous @ goal rate 105 ml/hr x 16 hrs per day.  At goal this provides 2520 kcal, 105 g pro, 1277 ml fluid.   -Free water  flushes 100 ml q-4 to provide fluid total of 1777 ml  (flushes at hr 0, 4, 8, 12, 16). Expect pt to meet fluid goal of >/=2L with PO intake-will assess Monday morning before possible d/c  -Pt will be using [employer] for EN at home, [employer] equivalent formula to be used, which is Nutren 1.5.   At goal this will provide 2520 kcal, 114 g protein, 1784 ml fluid w/ flushes   NUTRITION DIAGNOSIS:   Severe Malnutrition related to vomiting, chronic illness, altered GI function as evidenced by moderate fat depletion, severe muscle depletion.  Ongoing  GOAL:   Patient will meet greater than or equal to 90% of their needs  Met with EN via PEG/J  MONITOR:   PO intake, Weight trends, TF tolerance, Supplement acceptance, Diet advancement, Labs, Skin  REASON FOR ASSESSMENT:   New TF, Diagnosis    ASSESSMENT:   Hx hypertension, bicuspid aortic valve with severe aortic insufficiency and moderate aortic stenosis s/p mechanical aortic valve replacement in arthroplasty on 09/27/2021 on chronic anticoagulation, recurrent pancreatitis, asthma, GERD, and prior history of alcohol abuse presents with abdominal pain.  Continue almost daily visits with pt/mother. Pt is doing ok this afternoon. [name] has been NPO for PEG/J procedure [name], [name] to MD to recommend upgrading as pt would like some water . Pt has been receiving EN as ordered @ goal rate since ~8pm last night (per pt). Pt states some slight abdominal discomfort at re-initiation that resolved quickly. Pt today states no n/v/c/d. Last BM 9/5. No recent documented PO intake as pt continues to be hesitant about re-introducing foods as  they continue to cause discomfort. Discussed slow progression, small frequent meals, bland foods, low fat/low fiber recommendations with pancreatitis as well as solid vs liquid fat tolerance. Discussed possibility of continued decreased tolerance, discussed stent placement in a couple weeks. Again discussed pancreases role in digestion, need for low-fat low-fiber. Pt/mother with questions regarding insulin , if [name] will need insulin  at home; will discuss with MD. Pt/mother deny additional questions/concerns/needs at this [name], will continue to monitor, RDN available prn.    Labs BG 83-145 Creat 0.53 Albumin  3.2 H/H 11.4/34.3   Medications  Chlorhexidine  Gluconate Cloth  6 each Topical Daily   free water   25 mL Per Tube Q1H   insulin  aspart  0-9 Units Subcutaneous Q6H   lipase/protease/amylase  72,000 Units Oral TID AC   metoprolol  succinate  12.5 mg Oral Daily   pantoprazole  (PROTONIX ) IV  40 mg Intravenous Q12H   polyethylene glycol  17 g Per Tube TID   sodium chloride  flush  3 mL Intravenous Q12H     NUTRITION - FOCUSED PHYSICAL EXAM:  Flowsheet Row Most Recent Value  Orbital Region Moderate depletion  Upper Arm Region Moderate depletion  Thoracic and Lumbar Region Moderate depletion  Buccal Region Moderate depletion  Temple Region Moderate depletion  Clavicle Bone Region Severe depletion  Clavicle and Acromion Bone Region Severe depletion  Scapular Bone Region Moderate depletion  Dorsal Hand Moderate depletion  Patellar Region Moderate depletion  Anterior Thigh Region Moderate depletion  Posterior Calf Region Moderate depletion  Edema (RD Assessment) None  Hair Reviewed  Eyes  Reviewed  Mouth Reviewed  Skin Reviewed  Nails Reviewed    Diet Order:   Diet Order             Diet NPO [name] specified  Diet effective now                   EDUCATION NEEDS:   Education needs have been addressed  Skin:  Skin Assessment: Reviewed RN Assessment  Last BM:   8/31  Height:   Ht Readings from Last 1 Encounters:  01/11/24 5' 11 (1.803 m)    Weight:   Wt Readings from Last 1 Encounters:  02/13/24 65.9 kg    BMI:  Body mass index is 20.26 kg/m.  Estimated Nutritional Needs:   Kcal:  2200-2600 kcal  Protein:  80-100 g  Fluid:  >/=2L  [name] [name] [name] [name] [name] [name], RDN, LDN Registered Dietitian Nutritionist RD Inpatient Contact Info in [place]

## 2024-02-14 NOTE — Progress Notes (Signed)
 PHARMACY - ANTICOAGULATION CONSULT NOTE  Pharmacy Consult for Warfarin + heparin   Indication: On-X mechanical AVR  Allergies  Allergen Reactions   Amoxicillin Hives   Patient Measurements: Height: 5' 11 (180.3 cm) Weight: 65.9 kg (145 lb 4.5 oz) IBW/kg (Calculated) : 75.3 HEPARIN  DW (KG): 71  Vital Signs: Temp: 97.8 F (36.6 C) (09/05 0747) Temp Source: Oral (09/05 0747) BP: 135/92 (09/05 0747) Pulse Rate: 92 (09/05 0747)  Labs: Recent Labs    02/12/24 0146 02/13/24 0145 02/13/24 2349 02/14/24 0413 02/14/24 0811  HGB 11.1* 11.1*  --  11.4*  --   HCT 33.8* 34.0*  --  34.3*  --   PLT 333 338  --  331  --   LABPROT 20.6* 16.7*  --  16.1*  --   INR 1.7* 1.3*  --  1.2  --   HEPARINUNFRC  --   --  0.59 <0.10* <0.10*   Estimated Creatinine Clearance: 122.4 mL/min (A) (by C-G formula based on SCr of 0.53 mg/dL (L)).  Assessment: 33 yo M presenting with pancreatitis. Warfarin PTA regimen is 6 mg Monday and Thursday and 4.5 mg all other days (total weekly dose: 34.5 mg).  Pharmacy to start heparin  when INR < 1.5 and 6 hrs after GJ tube placement.  Heparin  level undetectable on 850 units/hr, but level drawn too soon (at about 3 and 1/2 hours). Will keep dose at 850 units/hr and redrawlevel - 6 - 8 hrs from starting heparin .  Update: HL at 8 hours after rate change was still undetectable at <0.1. Per RN, patient's heparin  running with no issues. Will likely need small bolus and rate change.   Goal of Therapy:  INR 1.5-2.0 Heparin  level 0.3 -0.7 IU/mL Monitor platelets by anticoagulation protocol: Yes   Plan:  Heparin  bolus of 3000 units x 1 Increase heparin  drip to 950 units/hr Heparin  level in 8 hours F/u restart warfarin  Monitor daily INR, heparin  level, CBC, signs/symptoms of bleeding    Ahja Martello A. Lyle, PharmD, BCPS, FNKF Clinical Pharmacist [place] Please utilize Amion for appropriate phone number to reach the unit pharmacist [facility])

## 2024-02-14 NOTE — Progress Notes (Signed)
 PHARMACY - ANTICOAGULATION CONSULT NOTE  Pharmacy Consult for Warfarin + heparin   Indication: On-X mechanical AVR  Allergies  Allergen Reactions   Amoxicillin Hives   Patient Measurements: Height: 5' 11 (180.3 cm) Weight: 65.9 kg (145 lb 4.5 oz) IBW/kg (Calculated) : 75.3 HEPARIN  DW (KG): 71  Vital Signs: Temp: 98 F (36.7 C) (09/05 1937) Temp Source: Oral (09/05 1937) BP: 117/77 (09/05 1937) Pulse Rate: 70 (09/05 1937)  Labs: Recent Labs    02/12/24 0146 02/13/24 0145 02/13/24 2349 02/14/24 0413 02/14/24 0811 02/14/24 2126  HGB 11.1* 11.1*  --  11.4*  --   --   HCT 33.8* 34.0*  --  34.3*  --   --   PLT 333 338  --  331  --   --   LABPROT 20.6* 16.7*  --  16.1*  --   --   INR 1.7* 1.3*  --  1.2  --   --   HEPARINUNFRC  --   --    < > <0.10* <0.10* <0.10*   < > = values in this interval not displayed.   Estimated Creatinine Clearance: 122.4 mL/min (A) (by C-G formula based on SCr of 0.53 mg/dL (L)).  Assessment: 33 yo M presenting with pancreatitis. Warfarin PTA regimen is 6 mg Monday and Thursday and 4.5 mg all other days (total weekly dose: 34.5 mg).  Pharmacy to start heparin  when INR < 1.5 and 6 hrs after GJ tube placement.   Update: Heparin  level still undetectable, increasing rate and will re-bolus. No infusion issues or signs of bleeding noted.  Goal of Therapy:  INR 1.5-2.0 Heparin  level 0.3 -0.7 IU/mL Monitor platelets by anticoagulation protocol: Yes   Plan:  Heparin  bolus of 3000 units x 1 Increase heparin  drip to 1100 units/hr Heparin  level in 8 hours F/u restart warfarin  Monitor daily INR, heparin  level, CBC, signs/symptoms of bleeding    Larraine Brazier, PharmD Clinical Pharmacist 02/14/2024  10:05 PM **Pharmacist phone directory can now be found on [email or web address] ([email or web address]).  Listed under [facility].

## 2024-02-14 NOTE — Progress Notes (Signed)
 PROGRESS NOTE    [name] [name]  FMW:[id number] DOB: 12/28/1990 DOA: 01/11/2024 PCP: Lucius Krabbe, NP   Brief Narrative:    Assessment & Plan:   Principal Problem:   Acute on chronic pancreatitis (HCC) Active Problems:   Pseudocyst of pancreas   S/P AVR (aortic valve replacement) and aortoplasty   Essential hypertension   On pre-exposure prophylaxis for HIV   History of asthma   History of alcohol abuse   Protein-calorie malnutrition, severe   Intraabdominal fluid collection    Acute on chronic pancreatitis, recurrent pancreatitis Acute fluid collection/pseudocyst Gastric pneumatosis -As per chart review, EUS by Dr. Wilhelmenia in 07/2023 that showed cystic lesion at the head of the pancreas consistent with a pseudocyst and a 5 mm stone within the pancreatic duct at the junction of the head/neck.   -Cytology of the cyst was benign.  He was scheduled for an ERCP with attempted stone extraction in September with Dr. Wilhelmenia but now admitted with abdominal pain. -[facility] was consulted, patient was treated supportively with n.p.o., IVF, core track with postpyloric feeding (initially started on 8/6).  Core track was dislodged on 8/18, briefly treated with liquid diet and core track was replaced.   -CT A/P on 8/18 showed gastric pneumatosis, general surgery was consulted, octreotide  stopped (to avoid dysmotility), patient was unable to tolerate NG tube placement, made n.p.o. again/tube feeds held, gentle IVF continuing, PICC line placed and TPN started on 01/29/2024. -Pneumatosis has resolved.  Cortrack tube has been removed.  Patient was tolerating heart healthy diet however was having intermittent nausea and vomiting.  Could not meet nutritional goals.  He did not want to go home with TPN.  General surgery consulted 01/27/2024, they thought it would be too risky to do percutaneous GJ or J-tube.  They started discussing with IR 02/04/2024, IR recommended placing  NJ/postpyloric tube to help with nutrition and consider for percutaneous tube later on. Patient had his postpyloric tube placed by IR 02/06/2024, tube feeds started, TPN weaned off 02/07/2024.  Tolerating tube feeds very well.  GJ tube placed by IR on 9/4 and the feeding rate is at goal currently.  Patiently currently getting IV heparin  per pharmacy, will likely restart Coumadin  today.  S/p mechanical aortic valve replacement and aortoplasty -INR currently 1.2, goal INR is 1.5-2.  On IV heparin  managed by pharmacy, restart Coumadin  today. -Follows with Dr. Wendel, Cardiology and the [facility]madin  Cl[facility]   Essential hypertension - Toprol -XL 12.5 mg daily resumed.  Alcohol use disorder, in remission Stable   Asthma Stable   Anemia of Chronic Disease Stable.   Small intestinal bacterial overgrowth (SIBO) Completed 14 days of rifaximin .  Resolved.   Nutrition Status: Nutrition Problem: Severe Malnutrition Started on tube feeding GJ  Start p.o. diet as tolerated Body mass index is 20.57 kg/m.   DVT prophylaxis: Coumadin /heparin  gtt   Code Status: Full Code  Family Communication: Mother present at bedside.  Plan of care discussed with patient in length and he/she verbalized understanding and agreed with it.   Status is: Inpatient  Patient: Plan to discharge home with tube feeding arrangement.   Consultants:   Procedures:   Antimicrobials:    Subjective:   Objective: Vitals:   02/14/24 0426 02/14/24 0747 02/14/24 1015 02/14/24 1609  BP: 136/87 (!) 135/92 (!) 135/92 131/83  Pulse: 90 92 92 74  Resp: 18 18  18   Temp: 98.1 F (36.7 C) 97.8 F (36.6 C)  98.9 F (37.2 C)  TempSrc: Oral Oral  Oral  SpO2: 100% 98%  99%  Weight:      Height:        Intake/Output Summary (Last 24 hours) at 02/14/2024 1646 Last data filed at 02/14/2024 1409 Gross per 24 hour  Intake 732.18 ml  Output --  Net 732.18 ml   Filed Weights   02/11/24 0754 02/12/24 0500 02/13/24 0500  Weight:  65.5 kg 65.9 kg 65.9 kg    Examination:  General exam: Appears calm and comfortable  Respiratory system: Bilateral decreased breath sounds at bases Cardiovascular system: S1 & S2 heard, Rate controlled Gastrointestinal system: Abdomen is nondistended, soft and nontender. Normal bowel sounds heard. Extremities: No cyanosis, clubbing, edema  Central nervous system: Alert and oriented. No focal neurological deficits. Moving extremities Skin: No rashes, lesions or ulcers Psychiatry: Judgement and insight appear normal. Mood & affect appropriate.     Data Reviewed: I have personally reviewed following labs and imaging studies  CBC: Recent Labs  Lab 02/12/24 0146 02/13/24 0145 02/14/24 0413  WBC 7.2 7.8 7.9  HGB 11.1* 11.1* 11.4*  HCT 33.8* 34.0* 34.3*  MCV 86.0 87.4 85.5  PLT 333 338 331   Basic Metabolic Panel: No results for input(s): NA, K, CL, CO2, GLUCOSE, BUN, CREATININE, CALCIUM , MG, PHOS in the last 168 hours. GFR: Estimated Creatinine Clearance: 122.4 mL/min (A) (by C-G formula based on SCr of 0.53 mg/dL (L)). Liver Function Tests: No results for input(s): AST, ALT, ALKPHOS, BILITOT, PROT, ALBUMIN  in the last 168 hours. No results for input(s): LIPASE, AMYLASE in the last 168 hours. No results for input(s): AMMONIA in the last 168 hours. Coagulation Profile: Recent Labs  Lab 02/10/24 0310 02/11/24 0324 02/12/24 0146 02/13/24 0145 02/14/24 0413  INR 2.0* 2.0* 1.7* 1.3* 1.2   Cardiac Enzymes: No results for input(s): CKTOTAL, CKMB, CKMBINDEX, TROPONINI in the last 168 hours. BNP (last 3 results) No results for input(s): PROBNP in the last 8760 hours. HbA1C: No results for input(s): HGBA1C in the last 72 hours. CBG: Recent Labs  Lab 02/13/24 1608 02/14/24 0019 02/14/24 0616 02/14/24 1306 02/14/24 1609  GLUCAP 83 124* 145* 133* 138*   Lipid Profile: No results for input(s): CHOL, HDL, LDLCALC,  TRIG, CHOLHDL, LDLDIRECT in the last 72 hours. Thyroid Function Tests: No results for input(s): TSH, T4TOTAL, FREET4, T3FREE, THYROIDAB in the last 72 hours. Anemia Panel: No results for input(s): VITAMINB12, FOLATE, FERRITIN, TIBC, IRON, RETICCTPCT in the last 72 hours. Sepsis Labs: No results for input(s): PROCALCITON, LATICACIDVEN in the last 168 hours.  No results found for this or any previous visit (from the past 240 hours).       Radiology Studies: IR GASTROSTOMY TUBE MOD SED Result Date: 02/13/2024 INDICATION: PANCREATITIS.  Dobbhoff tube dependent. EXAM: PERCUTANEOUS GASTROJEJUNOSTOMY TUBE PLACEMENT COMPARISON:  CT AP, 02/12/2024.  KUB, 02/06/2024. MEDICATIONS: Ancef  2 gm IV; Antibiotics were administered within 1 hour of the procedure. 0.5 mg glucagon  IV.  4 mg Zofran  IV. CONTRAST:  60 mL of Isovue 300 administered into the gastric lumen. ANESTHESIA/SEDATION: Moderate (conscious) sedation was employed during this procedure. A total of Versed  4 mg and Fentanyl  100 mcg was administered intravenously. Moderate Sedation Time: 44 minutes. The patient's level of consciousness and vital signs were monitored continuously by radiology nursing throughout the procedure under my direct supervision. FLUOROSCOPY: Radiation Exposure Index and estimated peak skin dose (PSD); Reference air kerma (RAK), 45.6 mGy. COMPLICATIONS: None immediate. PROCEDURE: Informed written consent was obtained from the patient and/or patient's representative following explanation of the  procedure, risks, benefits and alternatives. A time out was performed prior to the initiation of the procedure. Maximal barrier sterile technique utilized including caps, mask, sterile gowns, sterile gloves, large sterile drape, hand hygiene and sterile prep. The LEFT costal margin and barium opacified transverse colon were identified and avoided. Air was injected into the stomach for insufflation and  visualization under fluoroscopy. Under sterile conditions and local anesthesia, 2 T tacks were utilized to pexy the anterior aspect of the stomach against the ventral abdominal wall. Contrast injection confirmed appropriate positioning of each of the T tacks. An incision was made between the T tacks and a 17 gauge trocar needle was utilized to access the stomach. Needle position was confirmed within the stomach with aspiration of air and injection of a small amount of contrast. A 5 Fr 10 cm sheath was placed over a stiff guidewire, then was utilized to manipulate a Kumpe catheter past the pylorus into the proximal aspect of the small bowel. Contrast injection confirmed appropriate positioning. A 0.035 inch stiff Amplatz wire was placed, then the catheter and short sheath were removed. The tract was dilated allowing for placement of a telescoping peel-away sheath. Under intermittent fluoroscopic guidance, a 18 Fr large-bore gastrojejunostomy catheter was advanced with tip ultimately terminating within the proximal small bowel. Contrast injection via the jejunostomy and gastric lumens confirmed appropriate functioning and positioning. The retention balloon was insufflated with a mixture of dilute saline and contrast and pulled taut against the anterior wall of the stomach. The external disc was cinched. Several spot radiographic images were obtained in various obliquities for documentation. The patient tolerated procedure well without immediate post procedural complication. FINDINGS: After successful fluoroscopic guided placement, the tip of the jejunostomy lumen lies within the proximal jejunum, and gastrostomy portion is appropriately positioned with internal retention balloon against the ventral aspect of the gastric lumen. IMPRESSION: Successful fluoroscopic insertion of a 18 Fr balloon-retention gastrojejunostomy tube. The gastrostomy may be used immediately for medication administration, and the jejunostomy limb  in 4 hrs for the initiation of feeds. RECOMMENDATIONS: The patient will return to Vascular Interventional Radiology (VIR) for routine feeding tube evaluation and exchange in 6 months. Thom Hall, MD [facility] Radiology Electronically Signed   By: Thom Hall M.D.   On: 02/13/2024 15:08   IR GASTR TUBE CONVERT GASTR-JEJ PER W/FL MOD SED Result Date: 02/13/2024 INDICATION: PANCREATITIS.  Dobbhoff tube dependent. EXAM: PERCUTANEOUS GASTROJEJUNOSTOMY TUBE PLACEMENT COMPARISON:  CT AP, 02/12/2024.  KUB, 02/06/2024. MEDICATIONS: Ancef  2 gm IV; Antibiotics were administered within 1 hour of the procedure. 0.5 mg glucagon  IV.  4 mg Zofran  IV. CONTRAST:  60 mL of Isovue 300 administered into the gastric lumen. ANESTHESIA/SEDATION: Moderate (conscious) sedation was employed during this procedure. A total of Versed  4 mg and Fentanyl  100 mcg was administered intravenously. Moderate Sedation Time: 44 minutes. The patient's level of consciousness and vital signs were monitored continuously by radiology nursing throughout the procedure under my direct supervision. FLUOROSCOPY: Radiation Exposure Index and estimated peak skin dose (PSD); Reference air kerma (RAK), 45.6 mGy. COMPLICATIONS: None immediate. PROCEDURE: Informed written consent was obtained from the patient and/or patient's representative following explanation of the procedure, risks, benefits and alternatives. A time out was performed prior to the initiation of the procedure. Maximal barrier sterile technique utilized including caps, mask, sterile gowns, sterile gloves, large sterile drape, hand hygiene and sterile prep. The LEFT costal margin and barium opacified transverse colon were identified and avoided. Air was injected  into the stomach for insufflation and visualization under fluoroscopy. Under sterile conditions and local anesthesia, 2 T tacks were utilized to pexy the anterior aspect of the stomach against  the ventral abdominal wall. Contrast injection confirmed appropriate positioning of each of the T tacks. An incision was made between the T tacks and a 17 gauge trocar needle was utilized to access the stomach. Needle position was confirmed within the stomach with aspiration of air and injection of a small amount of contrast. A 5 Fr 10 cm sheath was placed over a stiff guidewire, then was utilized to manipulate a Kumpe catheter past the pylorus into the proximal aspect of the small bowel. Contrast injection confirmed appropriate positioning. A 0.035 inch stiff Amplatz wire was placed, then the catheter and short sheath were removed. The tract was dilated allowing for placement of a telescoping peel-away sheath. Under intermittent fluoroscopic guidance, a 18 Fr large-bore gastrojejunostomy catheter was advanced with tip ultimately terminating within the proximal small bowel. Contrast injection via the jejunostomy and gastric lumens confirmed appropriate functioning and positioning. The retention balloon was insufflated with a mixture of dilute saline and contrast and pulled taut against the anterior wall of the stomach. The external disc was cinched. Several spot radiographic images were obtained in various obliquities for documentation. The patient tolerated procedure well without immediate post procedural complication. FINDINGS: After successful fluoroscopic guided placement, the tip of the jejunostomy lumen lies within the proximal jejunum, and gastrostomy portion is appropriately positioned with internal retention balloon against the ventral aspect of the gastric lumen. IMPRESSION: Successful fluoroscopic insertion of a 18 Fr balloon-retention gastrojejunostomy tube. The gastrostomy may be used immediately for medication administration, and the jejunostomy limb in 4 hrs for the initiation of feeds. RECOMMENDATIONS: The patient will return to Vascular Interventional Radiology (VIR) for routine feeding tube  evaluation and exchange in 6 months. Thom Hall, MD [facility] Radiology Electronically Signed   By: Thom Hall M.D.   On: 02/13/2024 15:08   CT ABDOMEN PELVIS W CONTRAST Result Date: 02/12/2024 CLINICAL DATA:  Acute severe pancreatitis. EXAM: CT ABDOMEN AND PELVIS WITH CONTRAST TECHNIQUE: Multidetector CT imaging of the abdomen and pelvis was performed using the standard protocol following bolus administration of intravenous contrast. RADIATION DOSE REDUCTION: This exam was performed according to the departmental dose-optimization program which includes automated exposure control, adjustment of the mA and/or kV according to patient size and/or use of iterative reconstruction technique. CONTRAST:  75mL OMNIPAQUE  IOHEXOL  350 MG/ML SOLN COMPARISON:  CT abdomen and pelvis 01/30/2024. FINDINGS: Lower chest: No acute abnormality. Hepatobiliary: There is dilatation of the intra and extrahepatic bile ducts. No gallstones are identified. The liver is within normal limits. Pancreas: Multiple pancreatic head calcifications are again seen. There is chronic dilatation of the pancreatic duct measuring 9 mm, unchanged. The pancreatic head is diffusely heterogeneous. There is mild inflammatory stranding surrounding the pancreatic head. Previously identified cystic lesion in the head of the pancreas has decreased in size now measuring 1.9 by 0.7 cm (previously 4.1 cm in largest dimension). Spleen: Normal in size without focal abnormality. Adrenals/Urinary Tract: Adrenal glands are unremarkable. Kidneys are normal, without renal calculi, focal lesion, or hydronephrosis. Bladder is unremarkable. Stomach/Bowel: Enteric tube tip ends in the proximal jejunum. There some wall thickening and mild inflammation surrounding the gastric antrum and duodenal. There is a thick-walled cystic area at the level of the gastric antral wall as it approximates the pancreatic head image 3/31  measuring 2.6 x 1.6 by  1.3 mm. There is no bowel obstruction, pneumatosis or free air. The appendix is within normal limits. Vascular/Lymphatic: Aorta and IVC are normal in size. There some smooth mild tapering of the distal splenic vein, unchanged. Portal vein and splenic vein appear patent. No enlarged lymph nodes are identified. Reproductive: Uterus and bilateral adnexa are unremarkable. Other: No abdominal wall hernia or abnormality. No abdominopelvic ascites. Musculoskeletal: No acute or significant osseous findings. IMPRESSION: 1. Findings compatible with acute on chronic pancreatitis. 2. Previously identified cystic lesion in the head of the pancreas has decreased in size now measuring 1.9 x 0.7 cm (previously 4.1 cm in largest dimension). 3. There is a thick-walled cystic area at the level of the gastric antral wall as it approximates the pancreatic head measuring 2.6 x 1.6 x 1.3 mm. This may represent a pseudocyst. 4. Wall thickening thickening and inflammation of the gastric antrum and duodenal wall compatible with gastritis/duodenitis. 5. New dilatation of the intra and extrahepatic bile ducts. No gallstones are identified. Recommend clinical correlation. 6. Stable smooth mild tapering of the distal splenic vein. Portal vein and splenic vein appear patent. Electronically Signed   By: Greig Pique M.D.   On: 02/12/2024 21:24        Scheduled Meds:  Chlorhexidine  Gluconate Cloth  6 each Topical Daily   feeding supplement (OSMOLITE 1.5 CAL)  1,680 mL Per Tube Daily   free water   100 mL Per Tube 5 X Daily   insulin  aspart  0-9 Units Subcutaneous Q6H   lipase/protease/amylase  72,000 Units Oral TID AC   metoprolol  succinate  12.5 mg Oral Daily   pantoprazole  (PROTONIX ) IV  40 mg Intravenous Q12H   polyethylene glycol  17 g Per Tube TID   sodium chloride  flush  3 mL Intravenous Q12H   Continuous Infusions:  heparin  950 Units/hr (02/14/24 1000)          Derryl Duval, MD [facility] 02/14/2024, 4:46 PM

## 2024-02-15 LAB — GLUCOSE, CAPILLARY
Glucose-Capillary: 132 mg/dL — ABNORMAL HIGH (ref 70–99)
Glucose-Capillary: 140 mg/dL — ABNORMAL HIGH (ref 70–99)
Glucose-Capillary: 141 mg/dL — ABNORMAL HIGH (ref 70–99)
Glucose-Capillary: 152 mg/dL — ABNORMAL HIGH (ref 70–99)
Glucose-Capillary: 75 mg/dL (ref 70–99)

## 2024-02-15 LAB — CBC
HCT: 34.1 % — ABNORMAL LOW (ref 39.0–52.0)
Hemoglobin: 10.9 g/dL — ABNORMAL LOW (ref 13.0–17.0)
MCH: 28.2 pg (ref 26.0–34.0)
MCHC: 32 g/dL (ref 30.0–36.0)
MCV: 88.3 fL (ref 80.0–100.0)
Platelets: 326 K/uL (ref 150–400)
RBC: 3.86 MIL/uL — ABNORMAL LOW (ref 4.22–5.81)
RDW: 14.1 % (ref 11.5–15.5)
WBC: 4.7 K/uL (ref 4.0–10.5)
nRBC: 0 % (ref 0.0–0.2)

## 2024-02-15 LAB — PROTIME-INR
INR: 1 (ref 0.8–1.2)
Prothrombin Time: 13.9 s (ref 11.4–15.2)

## 2024-02-15 LAB — HEPARIN LEVEL (UNFRACTIONATED): Heparin Unfractionated: 0.16 [IU]/mL — ABNORMAL LOW (ref 0.30–0.70)

## 2024-02-15 MED ORDER — WARFARIN - PHARMACIST DOSING INPATIENT: Freq: Every day | Status: DC

## 2024-02-15 MED ORDER — WARFARIN SODIUM 4 MG PO TABS
4.5000 mg | ORAL_TABLET | Freq: Once | ORAL | Status: AC
  Administered 2024-02-15: 4.5 mg via ORAL
  Filled 2024-02-15: qty 1

## 2024-02-15 MED ORDER — ENOXAPARIN SODIUM 60 MG/0.6ML IJ SOSY
60.0000 mg | PREFILLED_SYRINGE | Freq: Two times a day (BID) | INTRAMUSCULAR | Status: DC
  Administered 2024-02-15 – 2024-02-17 (×5): 60 mg via SUBCUTANEOUS
  Filled 2024-02-15 (×5): qty 0.6

## 2024-02-15 MED ORDER — PANTOPRAZOLE SODIUM 40 MG PO TBEC
40.0000 mg | DELAYED_RELEASE_TABLET | Freq: Two times a day (BID) | ORAL | Status: DC
  Administered 2024-02-15 – 2024-02-17 (×5): 40 mg via ORAL
  Filled 2024-02-15 (×5): qty 1

## 2024-02-15 NOTE — Plan of Care (Signed)

## 2024-02-15 NOTE — Progress Notes (Signed)
RA DL PICC removed per protocol per MD order. Manual pressure applied for 3 mins. Vaseline gauze, gauze, and Tegaderm applied over insertion site. No bleeding or swelling noted. Instructed patient to remain in bed for thirty mins. Educated patient about S/S of infection and when to call MD; no heavy lifting or pressure on right side for 24 hours; keep dressing dry and intact for 24 hours. Pt verbalized comprehension.

## 2024-02-15 NOTE — Progress Notes (Signed)
 PROGRESS NOTE    [name] [name]  FMW:[id number] DOB: Nov 02, 1990 DOA: 01/11/2024 PCP: Lucius Krabbe, NP   Brief Narrative:    Assessment & Plan:   Principal Problem:   Acute on chronic pancreatitis (HCC) Active Problems:   Pseudocyst of pancreas   S/P AVR (aortic valve replacement) and aortoplasty   Essential hypertension   On pre-exposure prophylaxis for HIV   History of asthma   History of alcohol abuse   Protein-calorie malnutrition, severe   Intraabdominal fluid collection    Acute on chronic pancreatitis, recurrent pancreatitis Acute fluid collection/pseudocyst Gastric pneumatosis -As per chart review, EUS by Dr. Wilhelmenia in 07/2023 that showed cystic lesion at the head of the pancreas consistent with a pseudocyst and a 5 mm stone within the pancreatic duct at the junction of the head/neck.   -Cytology of the cyst was benign.  He was scheduled for an ERCP with attempted stone extraction in September with Dr. Wilhelmenia but now admitted with abdominal pain. -[facility] was consulted, patient was treated supportively with n.p.o., IVF, core track with postpyloric feeding (initially started on 8/6).  Core track was dislodged on 8/18, briefly treated with liquid diet and core track was replaced.   -CT A/P on 8/18 showed gastric pneumatosis, general surgery was consulted, octreotide  stopped (to avoid dysmotility), patient was unable to tolerate NG tube placement, made n.p.o. again/tube feeds held, gentle IVF continuing, PICC line placed and TPN started on 01/29/2024. -Pneumatosis has resolved.  Cortrack tube has been removed.  Patient was tolerating heart healthy diet however was having intermittent nausea and vomiting.  Could not meet nutritional goals.  He did not want to go home with TPN.  General surgery consulted 01/27/2024, they thought it would be too risky to do percutaneous GJ or J-tube.  They started discussing with IR 02/04/2024, IR recommended placing  NJ/postpyloric tube to help with nutrition and consider for percutaneous tube later on. Patient had his postpyloric tube placed by IR 02/06/2024, tube feeds started, TPN weaned off 02/07/2024.  Tolerating tube feeds very well.  GJ tube placed by IR on 9/4 and the feeding rate is at goal currently.  Patiently currently getting IV heparin  per pharmacy, will start Lovenox /Coumadin  today -Remove PICC line today  S/p mechanical aortic valve replacement and aortoplasty -INR currently 1.2, goal INR is 1.5-2.  On IV heparin  managed by pharmacy, restart Coumadin  today with Lovenox .  Discussed with pharmacy. -Follows with Dr. Wendel, Cardiology and the Coumadin  Clinic   Essential hypertension - Toprol -XL 12.5 mg daily resumed.  Alcohol use disorder, in remission Stable   Asthma Stable   Anemia of Chronic Disease Stable.   Small intestinal bacterial overgrowth (SIBO) Completed 14 days of rifaximin .  Resolved.   Nutrition Status: Nutrition Problem: Severe Malnutrition Started on tube feeding GJ  Start p.o. diet as tolerated Body mass index is 20.57 kg/m.   DVT prophylaxis: Coumadin /heparin  gtt   Code Status: Full Code  Family Communication: Mother present at bedside.  Plan of care discussed with patient in length and he/she verbalized understanding and agreed with it.   Status is: Inpatient  Patient: Plan to discharge home with tube feeding arrangement.   Consultants:   Procedures:   Antimicrobials:    Subjective:  Patient seen and examined at the bedside.  No acute issues overnight.  He has been tolerating tube feed well.  He is having good bowel movements.  Having some p.o. liquids.  No fever or chills.  Vital signs are stable.  Objective: Vitals:   02/15/24 0812 02/15/24 0855 02/15/24 1226 02/15/24 1617  BP: (!) 145/86 (!) 145/86 (!) 146/90 122/78  Pulse: 72 72 63 74  Resp: 20  20 20   Temp: 97.6 F (36.4 C)  (!) 97.4 F (36.3 C) 98.6 F (37 C)  TempSrc: Oral   Oral   SpO2: 99%  100% 100%  Weight:      Height:        Intake/Output Summary (Last 24 hours) at 02/15/2024 1621 Last data filed at 02/15/2024 0856 Gross per 24 hour  Intake 503 ml  Output --  Net 503 ml   Filed Weights   02/12/24 0500 02/13/24 0500 02/15/24 0500  Weight: 65.9 kg 65.9 kg 67 kg    Examination:  General exam: Appears calm and comfortable  Respiratory system: Bilateral decreased breath sounds at bases Cardiovascular system: S1 & S2 heard, Rate controlled Gastrointestinal system: Abdomen is nondistended, soft and nontender. Normal bowel sounds heard. Extremities: No cyanosis, clubbing, edema  Central nervous system: Alert and oriented. No focal neurological deficits. Moving extremities Skin: No rashes, lesions or ulcers Psychiatry: Judgement and insight appear normal. Mood & affect appropriate.     Data Reviewed: I have personally reviewed following labs and imaging studies  CBC: Recent Labs  Lab 02/12/24 0146 02/13/24 0145 02/14/24 0413 02/15/24 0450  WBC 7.2 7.8 7.9 4.7  HGB 11.1* 11.1* 11.4* 10.9*  HCT 33.8* 34.0* 34.3* 34.1*  MCV 86.0 87.4 85.5 88.3  PLT 333 338 331 326   Basic Metabolic Panel: No results for input(s): NA, K, CL, CO2, GLUCOSE, BUN, CREATININE, CALCIUM , MG, PHOS in the last 168 hours. GFR: Estimated Creatinine Clearance: 124.5 mL/min (A) (by C-G formula based on SCr of 0.53 mg/dL (L)). Liver Function Tests: No results for input(s): AST, ALT, ALKPHOS, BILITOT, PROT, ALBUMIN  in the last 168 hours. No results for input(s): LIPASE, AMYLASE in the last 168 hours. No results for input(s): AMMONIA in the last 168 hours. Coagulation Profile: Recent Labs  Lab 02/11/24 0324 02/12/24 0146 02/13/24 0145 02/14/24 0413 02/15/24 0450  INR 2.0* 1.7* 1.3* 1.2 1.0   Cardiac Enzymes: No results for input(s): CKTOTAL, CKMB, CKMBINDEX, TROPONINI in the last 168 hours. BNP (last 3 results) No results  for input(s): PROBNP in the last 8760 hours. HbA1C: No results for input(s): HGBA1C in the last 72 hours. CBG: Recent Labs  Lab 02/14/24 1609 02/15/24 0005 02/15/24 0601 02/15/24 1224 02/15/24 1616  GLUCAP 138* 140* 132* 75 141*   Lipid Profile: No results for input(s): CHOL, HDL, LDLCALC, TRIG, CHOLHDL, LDLDIRECT in the last 72 hours. Thyroid Function Tests: No results for input(s): TSH, T4TOTAL, FREET4, T3FREE, THYROIDAB in the last 72 hours. Anemia Panel: No results for input(s): VITAMINB12, FOLATE, FERRITIN, TIBC, IRON, RETICCTPCT in the last 72 hours. Sepsis Labs: No results for input(s): PROCALCITON, LATICACIDVEN in the last 168 hours.  No results found for this or any previous visit (from the past 240 hours).       Radiology Studies: No results found.       Scheduled Meds:  Chlorhexidine  Gluconate Cloth  6 each Topical Daily   enoxaparin  (LOVENOX ) injection  60 mg Subcutaneous Q12H   feeding supplement (OSMOLITE 1.5 CAL)  1,680 mL Per Tube Daily   free water   100 mL Per Tube 5 X Daily   insulin  aspart  0-9 Units Subcutaneous Q6H   lipase/protease/amylase  72,000 Units Oral TID AC   metoprolol  succinate  12.5 mg Oral Daily  pantoprazole   40 mg Oral BID   polyethylene glycol  17 g Per Tube TID   sodium chloride  flush  3 mL Intravenous Q12H   warfarin  4.5 mg Oral ONCE-1600   Warfarin - Pharmacist Dosing Inpatient   Does not apply q1600   Continuous Infusions:          Bunnie Lederman, MD [facility] 02/15/2024, 4:21 PM

## 2024-02-15 NOTE — Progress Notes (Signed)
 PHARMACY - ANTICOAGULATION CONSULT NOTE  Pharmacy Consult for Warfarin + heparin   Indication: On-X mechanical AVR  Allergies  Allergen Reactions   Amoxicillin Hives   Patient Measurements: Height: 5' 11 (180.3 cm) Weight: 67 kg (147 lb 11.3 oz) IBW/kg (Calculated) : 75.3 HEPARIN  DW (KG): 71  Vital Signs: Temp: 97.6 F (36.4 C) (09/06 0332) Temp Source: Oral (09/06 0332) BP: 118/77 (09/06 0332) Pulse Rate: 68 (09/06 0332)  Labs: Recent Labs    02/13/24 0145 02/13/24 2349 02/14/24 0413 02/14/24 0811 02/14/24 2126 02/15/24 0450  HGB 11.1*  --  11.4*  --   --  10.9*  HCT 34.0*  --  34.3*  --   --  34.1*  PLT 338  --  331  --   --  326  LABPROT 16.7*  --  16.1*  --   --  13.9  INR 1.3*  --  1.2  --   --  1.0  HEPARINUNFRC  --    < > <0.10* <0.10* <0.10*  --    < > = values in this interval not displayed.   Estimated Creatinine Clearance: 124.5 mL/min (A) (by C-G formula based on SCr of 0.53 mg/dL (L)).  Assessment: 33 yo M presenting with pancreatitis. Warfarin PTA regimen is 6 mg Monday and Thursday and 4.5 mg all other days (total weekly dose: 34.5 mg).  Pharmacy to start heparin  when INR < 1.5 and 6 hrs after GJ tube placement.  Heparin  has remained undetectable for 3 consecutive times. CBC is stable and INR is subtherapeutic at 1.0. Per MD, will transition to enoxaparin  to bridge with warfarin.  Goal of Therapy:  INR 1.5-2.0 Heparin  level 0.3 -0.7 IU/mL Monitor platelets by anticoagulation protocol: Yes   Plan:  Discontinue heparin  Start enoxaparin  60 mg Q12 Start warfarin 4.5 mg x1  Monitor daily INR, CBC, signs/symptoms of bleeding    B. Maegan Wyoma Genson, PharmD PGY-1 Pharmacy Resident [facility] 02/15/2024 7:26 AM

## 2024-02-16 LAB — BASIC METABOLIC PANEL WITH GFR
Anion gap: 11 (ref 5–15)
BUN: 12 mg/dL (ref 6–20)
CO2: 26 mmol/L (ref 22–32)
Calcium: 8.9 mg/dL (ref 8.9–10.3)
Chloride: 101 mmol/L (ref 98–111)
Creatinine, Ser: 0.62 mg/dL (ref 0.61–1.24)
GFR, Estimated: >60 mL/min (ref >60)
Glucose, Bld: 136 mg/dL — ABNORMAL HIGH (ref 70–99)
Potassium: 4 mmol/L (ref 3.5–5.1)
Sodium: 138 mmol/L (ref 135–145)

## 2024-02-16 LAB — GLUCOSE, CAPILLARY
Glucose-Capillary: 147 mg/dL — ABNORMAL HIGH (ref 70–99)
Glucose-Capillary: 125 mg/dL — ABNORMAL HIGH (ref 70–99)
Glucose-Capillary: 134 mg/dL — ABNORMAL HIGH (ref 70–99)

## 2024-02-16 LAB — CBC
HCT: 34.5 % — ABNORMAL LOW (ref 39.0–52.0)
Hemoglobin: 11 g/dL — ABNORMAL LOW (ref 13.0–17.0)
MCH: 27.9 pg (ref 26.0–34.0)
MCHC: 31.9 g/dL (ref 30.0–36.0)
MCV: 87.6 fL (ref 80.0–100.0)
Platelets: 301 K/uL (ref 150–400)
RBC: 3.94 MIL/uL — ABNORMAL LOW (ref 4.22–5.81)
RDW: 14 % (ref 11.5–15.5)
WBC: 6.5 K/uL (ref 4.0–10.5)
nRBC: 0 % (ref 0.0–0.2)

## 2024-02-16 LAB — PROTIME-INR
INR: 1 (ref 0.8–1.2)
Prothrombin Time: 13.4 s (ref 11.4–15.2)

## 2024-02-16 LAB — PHOSPHORUS: Phosphorus: 3.8 mg/dL (ref 2.5–4.6)

## 2024-02-16 MED ORDER — WARFARIN SODIUM 6 MG PO TABS
6.0000 mg | ORAL_TABLET | Freq: Once | ORAL | Status: AC
  Administered 2024-02-16: 6 mg via ORAL
  Filled 2024-02-16: qty 1

## 2024-02-16 MED ORDER — ASPIRIN 81 MG PO TBEC
81.0000 mg | DELAYED_RELEASE_TABLET | Freq: Every day | ORAL | Status: DC
  Administered 2024-02-16 – 2024-02-17 (×2): 81 mg via ORAL
  Filled 2024-02-16 (×2): qty 1

## 2024-02-16 NOTE — Progress Notes (Signed)
 PHARMACY - ANTICOAGULATION CONSULT NOTE  Pharmacy Consult for Warfarin  Indication: On-X mechanical AVR  Allergies  Allergen Reactions   Amoxicillin Hives   Patient Measurements: Height: 5' 11 (180.3 cm) Weight: 66.6 kg (146 lb 13.2 oz) IBW/kg (Calculated) : 75.3 HEPARIN  DW (KG): 71  Vital Signs: Temp: 98 F (36.7 C) (09/07 0340) Temp Source: Oral (09/07 0340) BP: 117/80 (09/07 0340) Pulse Rate: 81 (09/07 0340)  Labs: Recent Labs    02/14/24 0413 02/14/24 9188 02/14/24 2126 02/15/24 0450 02/15/24 0639 02/16/24 0154  HGB 11.4*  --   --  10.9*  --  11.0*  HCT 34.3*  --   --  34.1*  --  34.5*  PLT 331  --   --  326  --  301  LABPROT 16.1*  --   --  13.9  --  13.4  INR 1.2  --   --  1.0  --  1.0  HEPARINUNFRC <0.10* <0.10* <0.10*  --  0.16*  --    Estimated Creatinine Clearance: 123.7 mL/min (A) (by C-G formula based on SCr of 0.53 mg/dL (L)).  Assessment: 33 yo M presenting with pancreatitis. Warfarin PTA regimen is 6 mg Monday and Thursday and 4.5 mg all other days (total weekly dose: 34.5 mg). Heparin  was started on 9/2 post-GJ tube placement. Prior to warfarin being held and transitioned to heparin , patient was requiring warfarin 6 mg daily to maintain goal INR. Patient was transitioned from IV heparin  to enoxaparin  and warfarin per MD on 9/6.  INR is subtherapeutic at 1.0. CBC and plts are stable. No signs of bleeding noted bleeding noted.   Goal of Therapy:  INR 1.5-2.0 Heparin  level 0.3 -0.7 IU/mL Monitor platelets by anticoagulation protocol: Yes   Plan:  Warfarin 6 mg po today Continue enoxaparin  60 mg SQ Q12  Monitor daily INR, CBC, signs/symptoms of bleeding    B. Maegan Kenora Spayd, PharmD PGY-1 Pharmacy Resident [facility] 02/16/2024 7:45 AM

## 2024-02-16 NOTE — Progress Notes (Signed)
 PROGRESS NOTE    [name] [name]  FMW:[id number] DOB: 02/15/1991 DOA: 01/11/2024 PCP: Lucius Krabbe, NP   Brief Narrative:    Assessment & Plan:   Principal Problem:   Acute on chronic pancreatitis (HCC) Active Problems:   Pseudocyst of pancreas   S/P AVR (aortic valve replacement) and aortoplasty   Essential hypertension   On pre-exposure prophylaxis for HIV   History of asthma   History of alcohol abuse   Protein-calorie malnutrition, severe   Intraabdominal fluid collection    Acute on chronic pancreatitis, recurrent pancreatitis Acute fluid collection/pseudocyst Gastric pneumatosis -As per chart review, EUS by Dr. Wilhelmenia in 07/2023 that showed cystic lesion at the head of the pancreas consistent with a pseudocyst and a 5 mm stone within the pancreatic duct at the junction of the head/neck.   -Cytology of the cyst was benign.  He was scheduled for an ERCP with attempted stone extraction in September with Dr. Wilhelmenia but now admitted with abdominal pain. -[facility] was consulted, patient was treated supportively with n.p.o., IVF, core track with postpyloric feeding (initially started on 8/6).  Core track was dislodged on 8/18, briefly treated with liquid diet and core track was replaced.   -CT A/P on 8/18 showed gastric pneumatosis, general surgery was consulted, octreotide  stopped (to avoid dysmotility), patient was unable to tolerate NG tube placement, made n.p.o. again/tube feeds held, gentle IVF continuing, PICC line placed and TPN started on 01/29/2024. -Pneumatosis has resolved.  Cortrack tube has been removed.  Patient was tolerating heart healthy diet however was having intermittent nausea and vomiting.  Could not meet nutritional goals.  He did not want to go home with TPN.  General surgery consulted 01/27/2024, they thought it would be too risky to do percutaneous GJ or J-tube.  They started discussing with IR 02/04/2024, IR recommended placing  NJ/postpyloric tube to help with nutrition and consider for percutaneous tube later on. Patient had his postpyloric tube placed by IR 02/06/2024, tube feeds started, TPN weaned off 02/07/2024.  Tolerating tube feeds very well.  GJ tube placed by IR on 9/4 and the feeding rate is at goal currently.   IV heparin  switched to Lovenox /Coumadin  9/6, PICC line removed  S/p mechanical aortic valve replacement and aortoplasty -INR currently 1.0, goal INR is 1.5-2.  Continue Lovenox /Coumadin  bridge. discussed with pharmacy. -Follows with Dr. Wendel, Cardiology and the Coumadin  Clinic   Essential hypertension - Toprol -XL 12.5 mg daily resumed.  Alcohol use disorder, in remission Stable   Asthma Stable   Anemia of Chronic Disease Stable.   Small intestinal bacterial overgrowth (SIBO) Completed 14 days of rifaximin .  Resolved.   Nutrition Status: Nutrition Problem: Severe Malnutrition Started on tube feeding GJ, currently at goal Start p.o. diet as tolerated Body mass index is 20.57 kg/m.   DVT prophylaxis: Coumadin /heparin  gtt   Code Status: Full Code  Family Communication: Discussed with patient no family at the bedside.   Status is: Inpatient  Plan to discharge home with tube feeding arrangement.   Consultants:   Procedures:   Antimicrobials:    Subjective:  Patient seen and examined at the bedside.  No acute issues overnight.  He has been tolerating tube feed well.  He is having good bowel movements.  Having some p.o. liquids.  No fever or chills.  Vital signs are stable.  INR subtherapeutic 1.0 Objective: Vitals:   02/15/24 2351 02/16/24 0340 02/16/24 0800 02/16/24 0949  BP: 122/85 117/80 115/75 123/79  Pulse: 73 81 79  74  Resp: 14 14 16    Temp: 98.2 F (36.8 C) 98 F (36.7 C) 98 F (36.7 C)   TempSrc: Oral Oral Oral   SpO2: 98% 96% 97%   Weight:  66.6 kg    Height:        Intake/Output Summary (Last 24 hours) at 02/16/2024 1114 Last data filed at 02/16/2024  0955 Gross per 24 hour  Intake 1263 ml  Output --  Net 1263 ml   Filed Weights   02/13/24 0500 02/15/24 0500 02/16/24 0340  Weight: 65.9 kg 67 kg 66.6 kg    Examination:  General exam: Appears calm and comfortable  Respiratory system: Bilateral decreased breath sounds at bases Cardiovascular system: S1 & S2 heard, Rate controlled Gastrointestinal system: Abdomen is nondistended, soft and nontender. Normal bowel sounds heard. Extremities: No cyanosis, clubbing, edema  Central nervous system: Alert and oriented. No focal neurological deficits. Moving extremities Skin: No rashes, lesions or ulcers Psychiatry: Judgement and insight appear normal. Mood & affect appropriate.     Data Reviewed: I have personally reviewed following labs and imaging studies  CBC: Recent Labs  Lab 02/12/24 0146 02/13/24 0145 02/14/24 0413 02/15/24 0450 02/16/24 0154  WBC 7.2 7.8 7.9 4.7 6.5  HGB 11.1* 11.1* 11.4* 10.9* 11.0*  HCT 33.8* 34.0* 34.3* 34.1* 34.5*  MCV 86.0 87.4 85.5 88.3 87.6  PLT 333 338 331 326 301   Basic Metabolic Panel: Recent Labs  Lab 02/16/24 0822  NA 138  K 4.0  CL 101  CO2 26  GLUCOSE 136*  BUN 12  CREATININE 0.62  CALCIUM  8.9  PHOS 3.8   GFR: Estimated Creatinine Clearance: 123.7 mL/min (by C-G formula based on SCr of 0.62 mg/dL). Liver Function Tests: No results for input(s): AST, ALT, ALKPHOS, BILITOT, PROT, ALBUMIN  in the last 168 hours. No results for input(s): LIPASE, AMYLASE in the last 168 hours. No results for input(s): AMMONIA in the last 168 hours. Coagulation Profile: Recent Labs  Lab 02/12/24 0146 02/13/24 0145 02/14/24 0413 02/15/24 0450 02/16/24 0154  INR 1.7* 1.3* 1.2 1.0 1.0   Cardiac Enzymes: No results for input(s): CKTOTAL, CKMB, CKMBINDEX, TROPONINI in the last 168 hours. BNP (last 3 results) No results for input(s): PROBNP in the last 8760 hours. HbA1C: No results for input(s): HGBA1C in the  last 72 hours. CBG: Recent Labs  Lab 02/15/24 0601 02/15/24 1224 02/15/24 1616 02/15/24 2352 02/16/24 0604  GLUCAP 132* 75 141* 152* 147*   Lipid Profile: No results for input(s): CHOL, HDL, LDLCALC, TRIG, CHOLHDL, LDLDIRECT in the last 72 hours. Thyroid Function Tests: No results for input(s): TSH, T4TOTAL, FREET4, T3FREE, THYROIDAB in the last 72 hours. Anemia Panel: No results for input(s): VITAMINB12, FOLATE, FERRITIN, TIBC, IRON, RETICCTPCT in the last 72 hours. Sepsis Labs: No results for input(s): PROCALCITON, LATICACIDVEN in the last 168 hours.  No results found for this or any previous visit (from the past 240 hours).       Radiology Studies: No results found.       Scheduled Meds:  aspirin  EC  81 mg Oral Daily   Chlorhexidine  Gluconate Cloth  6 each Topical Daily   enoxaparin  (LOVENOX ) injection  60 mg Subcutaneous Q12H   feeding supplement (OSMOLITE 1.5 CAL)  1,680 mL Per Tube Daily   free water   100 mL Per Tube 5 X Daily   insulin  aspart  0-9 Units Subcutaneous Q6H   lipase/protease/amylase  72,000 Units Oral TID AC   metoprolol  succinate  12.5 mg  Oral Daily   pantoprazole   40 mg Oral BID   polyethylene glycol  17 g Per Tube TID   sodium chloride  flush  3 mL Intravenous Q12H   warfarin  6 mg Oral ONCE-1600   Warfarin - Pharmacist Dosing Inpatient   Does not apply q1600   Continuous Infusions:          Zylpha Poynor, MD [facility] 02/16/2024, 11:14 AM

## 2024-02-17 ENCOUNTER — Other Ambulatory Visit (HOSPITAL_COMMUNITY): Payer: Self-pay

## 2024-02-17 LAB — CBC
HCT: 37.3 % — ABNORMAL LOW (ref 39.0–52.0)
Hemoglobin: 11.8 g/dL — ABNORMAL LOW (ref 13.0–17.0)
MCH: 27.8 pg (ref 26.0–34.0)
MCHC: 31.6 g/dL (ref 30.0–36.0)
MCV: 87.8 fL (ref 80.0–100.0)
Platelets: 314 K/uL (ref 150–400)
RBC: 4.25 MIL/uL (ref 4.22–5.81)
RDW: 14.1 % (ref 11.5–15.5)
WBC: 4 K/uL (ref 4.0–10.5)
nRBC: 0 % (ref 0.0–0.2)

## 2024-02-17 LAB — GLUCOSE, CAPILLARY
Glucose-Capillary: 101 mg/dL — ABNORMAL HIGH (ref 70–99)
Glucose-Capillary: 127 mg/dL — ABNORMAL HIGH (ref 70–99)
Glucose-Capillary: 134 mg/dL — ABNORMAL HIGH (ref 70–99)

## 2024-02-17 LAB — PROTIME-INR
INR: 1 (ref 0.8–1.2)
Prothrombin Time: 13.8 s (ref 11.4–15.2)

## 2024-02-17 MED ORDER — WARFARIN SODIUM 3 MG PO TABS
ORAL_TABLET | ORAL | 0 refills | Status: DC
  Filled 2024-02-17: qty 110, 55d supply, fill #0

## 2024-02-17 MED ORDER — HYDROXYZINE HCL 25 MG PO TABS: 25.0000 mg | ORAL_TABLET | Freq: Three times a day (TID) | ORAL | 0 refills | Status: DC | PRN

## 2024-02-17 MED ORDER — POLYETHYLENE GLYCOL 3350 17 G PO PACK: 17.0000 g | PACK | Freq: Two times a day (BID) | ORAL | 0 refills | Status: DC

## 2024-02-17 MED ORDER — ENOXAPARIN SODIUM 60 MG/0.6ML IJ SOSY
60.0000 mg | PREFILLED_SYRINGE | Freq: Two times a day (BID) | INTRAMUSCULAR | 0 refills | Status: DC
  Filled 2024-02-17: qty 8.4, 7d supply, fill #0

## 2024-02-17 MED ORDER — HYDROXYZINE HCL 25 MG PO TABS
25.0000 mg | ORAL_TABLET | Freq: Three times a day (TID) | ORAL | 0 refills | Status: AC | PRN
  Filled 2024-02-17 (×2): qty 30, 10d supply, fill #0

## 2024-02-17 MED ORDER — OXYCODONE-ACETAMINOPHEN 5-325 MG PO TABS: 2.0000 | ORAL_TABLET | ORAL | 0 refills | Status: DC | PRN

## 2024-02-17 MED ORDER — ENOXAPARIN SODIUM 60 MG/0.6ML IJ SOSY: 60.0000 mg | PREFILLED_SYRINGE | Freq: Two times a day (BID) | INTRAMUSCULAR | 0 refills | Status: DC

## 2024-02-17 MED ORDER — OXYCODONE-ACETAMINOPHEN 5-325 MG PO TABS
2.0000 | ORAL_TABLET | ORAL | 0 refills | Status: DC | PRN
  Filled 2024-02-17 (×2): qty 30, 3d supply, fill #0

## 2024-02-17 MED ORDER — POLYETHYLENE GLYCOL 3350 17 GM/SCOOP PO POWD
17.0000 g | Freq: Two times a day (BID) | ORAL | 0 refills | Status: AC
  Filled 2024-02-17: qty 238, 7d supply, fill #0

## 2024-02-17 MED ORDER — WARFARIN SODIUM 6 MG PO TABS
6.0000 mg | ORAL_TABLET | Freq: Once | ORAL | Status: DC
Start: 2024-02-17 — End: 2024-02-17
  Filled 2024-02-17: qty 1

## 2024-02-17 NOTE — Progress Notes (Signed)
 PHARMACY - ANTICOAGULATION CONSULT NOTE  Pharmacy Consult for Warfarin  Indication: On-X mechanical AVR  Allergies  Allergen Reactions   Amoxicillin Hives   Patient Measurements: Height: 5' 11 (180.3 cm) Weight: 66.3 kg (146 lb 2.6 oz) IBW/kg (Calculated) : 75.3 HEPARIN  DW (KG): 71  Vital Signs: Temp: 97.3 F (36.3 C) (09/08 0859) Temp Source: Oral (09/08 0859) BP: 92/66 (09/08 0859) Pulse Rate: 70 (09/08 0859)  Labs: Recent Labs    02/14/24 2126 02/15/24 0450 02/15/24 0450 02/15/24 0639 02/16/24 0154 02/16/24 0822 02/17/24 0405  HGB  --  10.9*   < >  --  11.0*  --  11.8*  HCT  --  34.1*  --   --  34.5*  --  37.3*  PLT  --  326  --   --  301  --  314  LABPROT  --  13.9  --   --  13.4  --  13.8  INR  --  1.0  --   --  1.0  --  1.0  HEPARINUNFRC <0.10*  --   --  0.16*  --   --   --   CREATININE  --   --   --   --   --  0.62  --    < > = values in this interval not displayed.   Estimated Creatinine Clearance: 123.2 mL/min (by C-G formula based on SCr of 0.62 mg/dL).  Assessment: 33 yo M presenting with pancreatitis. Warfarin PTA regimen is 6 mg Monday and Thursday and 4.5 mg all other days (total weekly dose: 34.5 mg). Heparin  was started on 9/2 post-GJ tube placement. Prior to warfarin being held and transitioned to heparin , patient was requiring warfarin 6 mg daily to maintain goal INR. Patient was transitioned from IV heparin  to enoxaparin  and warfarin per MD on 9/6.  INR is subtherapeutic at 1.0. CBC and plts are stable. No signs of bleeding noted bleeding noted. Warfarin resumed 9/06 per pharmacy to dose.  Goal of Therapy:  INR 1.5-2.0 Heparin  level 0.3 -0.7 IU/mL Monitor platelets by anticoagulation protocol: Yes   Plan:   Continue enoxaparin  60 mg SQ Q12h  Give warfarin 6 mg PO x1 dose Check INR daily while on warfarin Continue to monitor H&H and platelets  For discharge planning, can consider resuming PTA warfarin regimen with enoxaparin  bridge until  follow-up at anticoagulation clinic outpatient.   Thank you for allowing pharmacy to be a part of this patient's care.  Shelba Collier, PharmD, BCPS Clinical Pharmacist

## 2024-02-17 NOTE — Discharge Summary (Signed)
 Physician Discharge Summary  [name] [name] FMW:[id number] DOB: 01-May-1991 DOA: 01/11/2024  PCP: Lucius Krabbe, NP  Admit date: 01/11/2024 Discharge date: 02/17/2024  Admitted From: Home Disposition: Home  Recommendations for Outpatient Follow-up:  Follow up with PCP in 1 week with repeat CBC/BMP Follow up in ED if symptoms worsen or new appear   Home Health: No Equipment/Devices: None  Discharge Condition: Stable CODE STATUS: Full Diet recommendation: Heart healthy  Brief/Interim Summary:   33 year old male with history of acute on chronic pancreatitis, recurrent pancreatitis/pseudocyst who was admitted to the hospital on 01/11/2024.  Hospital course was complicated, ultimately patient had gastrojejunostomy placed for feeding which he has tolerated so far.  Patient is discharged home with tube feeding arrangement.  He has history of mitral valve mechanical aortic valve on Coumadin  with goal INR 1.5-2.  Will be on Coumadin /Lovenox  bridge.  Need to follow-up closely with Coumadin  clinic.  Details of hospitalization as below.   Acute on chronic pancreatitis, recurrent pancreatitis Acute fluid collection/pseudocyst Gastric pneumatosis -As per chart review, EUS by Dr. Wilhelmenia in 07/2023 that showed cystic lesion at the head of the pancreas consistent with a pseudocyst and a 5 mm stone within the pancreatic duct at the junction of the head/neck.   -Cytology of the cyst was benign.  He was scheduled for an ERCP with attempted stone extraction in September with Dr. Wilhelmenia but now admitted with abdominal pain. -[facility] was consulted, patient was treated supportively with n.p.o., IVF, core track with postpyloric feeding (initially started on 8/6).  Core track was dislodged on 8/18, briefly treated with liquid diet and core track was replaced.   -CT A/P on 8/18 showed gastric pneumatosis, general surgery was consulted, octreotide  stopped (to avoid dysmotility), patient was  unable to tolerate NG tube placement, made n.p.o. again/tube feeds held, gentle IVF continuing, PICC line placed and TPN started on 01/29/2024. -Pneumatosis has resolved.  Cortrack tube has been removed.  Patient was tolerating heart healthy diet however was having intermittent nausea and vomiting.  Could not meet nutritional goals.  He did not want to go home with TPN.  Patient then had  postpyloric tube placed by IR 02/06/2024, tube feeds started, TPN weaned off 02/07/2024.  Ultimately GJ tube placed by IR on 9/4 .  IV heparin  switched to Lovenox /Coumadin  9/6, PICC line removed   S/p mechanical aortic valve replacement and aortoplasty -INR currently 1.0, goal INR is 1.5-2.  Continue Lovenox /Coumadin  bridge.  Patient has done Lovenox /Coumadin  bridge in the past and is comfortable with it. -Follows with Dr. Wendel, Cardiology and the Coumadin  Clinic   Essential hypertension - Toprol -XL 12.5 mg daily resumed.   Alcohol use disorder, in remission Stable   Asthma Stable   Anemia of Chronic Disease Stable.   Small intestinal bacterial overgrowth (SIBO) Completed 14 days of rifaximin .  Resolved.   Nutrition Status: Nutrition Problem: Severe Malnutrition Started on tube feeding GJ, currently at goal Start p.o. diet as tolerated Body mass index is 20.57 kg/m.    Plan to discharge home with tube feeding arrangement.      Discharge Diagnoses:  Principal Problem:   Acute on chronic pancreatitis (HCC) Active Problems:   Pseudocyst of pancreas   S/P AVR (aortic valve replacement) and aortoplasty   Essential hypertension   On pre-exposure prophylaxis for HIV   History of asthma   History of alcohol abuse   Protein-calorie malnutrition, severe   Intraabdominal fluid collection    Discharge Instructions  Discharge Instructions  Call MD for:  persistant nausea and vomiting   Complete by: As directed    Call MD for:  redness, tenderness, or signs of infection (pain, swelling,  redness, odor or green/yellow discharge around incision site)   Complete by: As directed    Call MD for:  severe uncontrolled pain   Complete by: As directed    Call MD for:  temperature >100.4   Complete by: As directed    Discharge instructions   Complete by: As directed    1.  Continue with Lovenox /Coumadin  bridge.  Please follow-up in the Coumadin  clinic this Wednesday( 02/19/24) for INR check.  Stop Lovenox  once INR therapeutic for 2 consecutive days. 2.  Continue tube feedings as instructed 3.  Return to the ER if recurrence of symptoms.   Increase activity slowly   Complete by: As directed       Allergies as of 02/17/2024       Reactions   Amoxicillin Hives        Medication List     STOP taking these medications    dicyclomine  10 MG capsule Commonly known as: BENTYL        TAKE these medications    acetaminophen  500 MG tablet Commonly known as: TYLENOL  Take 1,000 mg by mouth every 6 (six) hours as needed for moderate pain (pain score 4-6).   aspirin  EC 81 MG tablet Take 1 tablet (81 mg total) by mouth daily. Swallow whole.   emtricitabine -tenofovir  200-300 MG tablet Commonly known as: TRUVADA Take 1 tablet by mouth daily.   enoxaparin  60 MG/0.6ML injection Commonly known as: LOVENOX  Inject 0.6 mLs (60 mg total) into the skin every 12 (twelve) hours for 7 days.   hydrOXYzine  25 MG tablet Commonly known as: ATARAX  Take 1 tablet (25 mg total) by mouth 3 (three) times daily as needed for anxiety.   lipase/protease/amylase 63999 UNITS Cpep capsule Commonly known as: Creon  Take 2 capsules (72,000 Units total) by mouth 3 (three) times daily before meals.   loratadine  10 MG tablet Commonly known as: CLARITIN  Take 10 mg by mouth daily.   methocarbamol  500 MG tablet Commonly known as: ROBAXIN  Take 1-2 tablets (500-1,000 mg total) by mouth 4 (four) times daily. What changed:  how much to take when to take this reasons to take this   metoprolol   succinate 25 MG 24 hr tablet Commonly known as: Toprol  XL Take 0.5 tablets (12.5 mg total) by mouth at bedtime.   oxyCODONE -acetaminophen  5-325 MG tablet Commonly known as: PERCOCET/ROXICET Place 2 tablets into feeding tube every 4 (four) hours as needed for moderate pain (pain score 4-6) or severe pain (pain score 7-10).   polyethylene glycol powder 17 GM/SCOOP powder Commonly known as: GLYCOLAX /MIRALAX  Place 1 capful (17 g) into feeding tube 2 (two) times daily.   warfarin 3 MG tablet Commonly known as: COUMADIN  Take as directed. If you are unsure how to take this medication, talk to your nurse or doctor. Original instructions: TAKE 1 & 1/2 TABLETS TO 2 TABLETS BY MOUTH DAILY OR AS DIRECTED BY COAGULATION CLINIC What changed: additional instructions        Follow-up Information     [facility] Follow up.   Why: [facility] will provide nutritional support for home.        Dasie Leonor CROME, MD. Go on 02/24/2024.   Specialty: General Surgery Why: At 3:00 PM; Please arrive 30 minutes prior to your scheduled appointment time and bring ID with insurance cards. Contact information: [address]  [address] [place] [address] [phone]         [facility], [facility] Follow up.   Specialty: Home Health Services Why: [facility] will provide home health services for RN. Contact information: [address] [place] [address] [phone]                Allergies  Allergen Reactions   Amoxicillin Hives    Consultations:    Procedures/Studies: IR GASTROSTOMY TUBE MOD SED Result Date: 02/13/2024 INDICATION: PANCREATITIS.  Dobbhoff tube dependent. EXAM: PERCUTANEOUS GASTROJEJUNOSTOMY TUBE PLACEMENT COMPARISON:  CT AP, 02/12/2024.  KUB, 02/06/2024. MEDICATIONS: Ancef  2 gm IV; Antibiotics were administered within 1 hour of the procedure. 0.5 mg glucagon  IV.  4 mg Zofran  IV. CONTRAST:  60 mL of Isovue 300 administered into the gastric lumen.  ANESTHESIA/SEDATION: Moderate (conscious) sedation was employed during this procedure. A total of Versed  4 mg and Fentanyl  100 mcg was administered intravenously. Moderate Sedation Time: 44 minutes. The patient's level of consciousness and vital signs were monitored continuously by radiology nursing throughout the procedure under my direct supervision. FLUOROSCOPY: Radiation Exposure Index and estimated peak skin dose (PSD); Reference air kerma (RAK), 45.6 mGy. COMPLICATIONS: None immediate. PROCEDURE: Informed written consent was obtained from the patient and/or patient's representative following explanation of the procedure, risks, benefits and alternatives. A time out was performed prior to the initiation of the procedure. Maximal barrier sterile technique utilized including caps, mask, sterile gowns, sterile gloves, large sterile drape, hand hygiene and sterile prep. The LEFT costal margin and barium opacified transverse colon were identified and avoided. Air was injected into the stomach for insufflation and visualization under fluoroscopy. Under sterile conditions and local anesthesia, 2 T tacks were utilized to pexy the anterior aspect of the stomach against the ventral abdominal wall. Contrast injection confirmed appropriate positioning of each of the T tacks. An incision was made between the T tacks and a 17 gauge trocar needle was utilized to access the stomach. Needle position was confirmed within the stomach with aspiration of air and injection of a small amount of contrast. A 5 Fr 10 cm sheath was placed over a stiff guidewire, then was utilized to manipulate a Kumpe catheter past the pylorus into the proximal aspect of the small bowel. Contrast injection confirmed appropriate positioning. A 0.035 inch stiff Amplatz wire was placed, then the catheter and short sheath were removed. The tract was dilated allowing for placement of a telescoping peel-away sheath. Under intermittent fluoroscopic guidance, a  18 Fr large-bore gastrojejunostomy catheter was advanced with tip ultimately terminating within the proximal small bowel. Contrast injection via the jejunostomy and gastric lumens confirmed appropriate functioning and positioning. The retention balloon was insufflated with a mixture of dilute saline and contrast and pulled taut against the anterior wall of the stomach. The external disc was cinched. Several spot radiographic images were obtained in various obliquities for documentation. The patient tolerated procedure well without immediate post procedural complication. FINDINGS: After successful fluoroscopic guided placement, the tip of the jejunostomy lumen lies within the proximal jejunum, and gastrostomy portion is appropriately positioned with internal retention balloon against the ventral aspect of the gastric lumen. IMPRESSION: Successful fluoroscopic insertion of a 18 Fr balloon-retention gastrojejunostomy tube. The gastrostomy may be used immediately for medication administration, and the jejunostomy limb in 4 hrs for the initiation of feeds. RECOMMENDATIONS: The patient will return to Vascular Interventional Radiology (VIR) for routine feeding tube evaluation and exchange in 6 months. Thom Hall, MD [facility] Radiology Electronically Signed   By: Thom Hall M.D.   On: 02/13/2024 15:08   IR GASTR TUBE CONVERT GASTR-JEJ PER W/FL MOD SED Result Date: 02/13/2024 INDICATION: PANCREATITIS.  Dobbhoff tube dependent. EXAM: PERCUTANEOUS GASTROJEJUNOSTOMY TUBE PLACEMENT COMPARISON:  CT AP, 02/12/2024.  KUB, 02/06/2024. MEDICATIONS: Ancef  2 gm IV; Antibiotics were administered within 1 hour of the procedure. 0.5 mg glucagon  IV.  4 mg Zofran  IV. CONTRAST:  60 mL of Isovue 300 administered into the gastric lumen. ANESTHESIA/SEDATION: Moderate (conscious) sedation was employed during this procedure. A total of Versed  4 mg and Fentanyl  100 mcg was administered  intravenously. Moderate Sedation Time: 44 minutes. The patient's level of consciousness and vital signs were monitored continuously by radiology nursing throughout the procedure under my direct supervision. FLUOROSCOPY: Radiation Exposure Index and estimated peak skin dose (PSD); Reference air kerma (RAK), 45.6 mGy. COMPLICATIONS: None immediate. PROCEDURE: Informed written consent was obtained from the patient and/or patient's representative following explanation of the procedure, risks, benefits and alternatives. A time out was performed prior to the initiation of the procedure. Maximal barrier sterile technique utilized including caps, mask, sterile gowns, sterile gloves, large sterile drape, hand hygiene and sterile prep. The LEFT costal margin and barium opacified transverse colon were identified and avoided. Air was injected into the stomach for insufflation and visualization under fluoroscopy. Under sterile conditions and local anesthesia, 2 T tacks were utilized to pexy the anterior aspect of the stomach against the ventral abdominal wall. Contrast injection confirmed appropriate positioning of each of the T tacks. An incision was made between the T tacks and a 17 gauge trocar needle was utilized to access the stomach. Needle position was confirmed within the stomach with aspiration of air and injection of a small amount of contrast. A 5 Fr 10 cm sheath was placed over a stiff guidewire, then was utilized to manipulate a Kumpe catheter past the pylorus into the proximal aspect of the small bowel. Contrast injection confirmed appropriate positioning. A 0.035 inch stiff Amplatz wire was placed, then the catheter and short sheath were removed. The tract was dilated allowing for placement of a telescoping peel-away sheath. Under intermittent fluoroscopic guidance, a 18 Fr large-bore gastrojejunostomy catheter was advanced with tip ultimately terminating within the proximal small bowel. Contrast injection via the  jejunostomy and gastric lumens confirmed appropriate functioning and positioning. The retention balloon was insufflated with a mixture of dilute saline and contrast and pulled taut against the anterior wall of the stomach. The external disc was cinched. Several spot radiographic images were obtained in various obliquities for documentation. The patient tolerated procedure well without immediate post procedural complication. FINDINGS: After successful fluoroscopic guided placement, the tip of the jejunostomy lumen lies within the proximal jejunum, and gastrostomy portion is appropriately positioned with internal retention balloon against the ventral aspect of the gastric lumen. IMPRESSION: Successful fluoroscopic insertion of a 18 Fr balloon-retention gastrojejunostomy tube. The gastrostomy may be used immediately for medication administration, and the jejunostomy limb in 4 hrs for the initiation of feeds. RECOMMENDATIONS: The patient will return to Vascular Interventional Radiology (VIR) for routine feeding tube evaluation and exchange in 6 months. Thom Hall, MD [facility] Radiology Electronically Signed   By: Thom Hall M.D.   On: 02/13/2024 15:08   CT ABDOMEN PELVIS W CONTRAST Result Date: 02/12/2024 CLINICAL DATA:  Acute severe pancreatitis. EXAM: CT ABDOMEN AND PELVIS WITH CONTRAST TECHNIQUE: Multidetector CT imaging of the abdomen and pelvis was  performed using the standard protocol following bolus administration of intravenous contrast. RADIATION DOSE REDUCTION: This exam was performed according to the departmental dose-optimization program which includes automated exposure control, adjustment of the mA and/or kV according to patient size and/or use of iterative reconstruction technique. CONTRAST:  75mL OMNIPAQUE  IOHEXOL  350 MG/ML SOLN COMPARISON:  CT abdomen and pelvis 01/30/2024. FINDINGS: Lower chest: No acute abnormality. Hepatobiliary: There is  dilatation of the intra and extrahepatic bile ducts. No gallstones are identified. The liver is within normal limits. Pancreas: Multiple pancreatic head calcifications are again seen. There is chronic dilatation of the pancreatic duct measuring 9 mm, unchanged. The pancreatic head is diffusely heterogeneous. There is mild inflammatory stranding surrounding the pancreatic head. Previously identified cystic lesion in the head of the pancreas has decreased in size now measuring 1.9 by 0.7 cm (previously 4.1 cm in largest dimension). Spleen: Normal in size without focal abnormality. Adrenals/Urinary Tract: Adrenal glands are unremarkable. Kidneys are normal, without renal calculi, focal lesion, or hydronephrosis. Bladder is unremarkable. Stomach/Bowel: Enteric tube tip ends in the proximal jejunum. There some wall thickening and mild inflammation surrounding the gastric antrum and duodenal. There is a thick-walled cystic area at the level of the gastric antral wall as it approximates the pancreatic head image 3/31 measuring 2.6 x 1.6 by 1.3 mm. There is no bowel obstruction, pneumatosis or free air. The appendix is within normal limits. Vascular/Lymphatic: Aorta and IVC are normal in size. There some smooth mild tapering of the distal splenic vein, unchanged. Portal vein and splenic vein appear patent. No enlarged lymph nodes are identified. Reproductive: Uterus and bilateral adnexa are unremarkable. Other: No abdominal wall hernia or abnormality. No abdominopelvic ascites. Musculoskeletal: No acute or significant osseous findings. IMPRESSION: 1. Findings compatible with acute on chronic pancreatitis. 2. Previously identified cystic lesion in the head of the pancreas has decreased in size now measuring 1.9 x 0.7 cm (previously 4.1 cm in largest dimension). 3. There is a thick-walled cystic area at the level of the gastric antral wall as it approximates the pancreatic head measuring 2.6 x 1.6 x 1.3 mm. This may represent  a pseudocyst. 4. Wall thickening thickening and inflammation of the gastric antrum and duodenal wall compatible with gastritis/duodenitis. 5. New dilatation of the intra and extrahepatic bile ducts. No gallstones are identified. Recommend clinical correlation. 6. Stable smooth mild tapering of the distal splenic vein. Portal vein and splenic vein appear patent. Electronically Signed   By: Greig Pique M.D.   On: 02/12/2024 21:24   DG Abd 1 View Result Date: 02/06/2024 CLINICAL DATA:  [id number] Encounter for feeding tube placement [phone]. EXAM: ABDOMEN - 1 VIEW COMPARISON:  01/27/2024. FINDINGS: The bowel gas pattern is non-obstructive. No evidence of pneumoperitoneum, within the limitations of a supine film. No acute osseous abnormalities. The soft tissues are within normal limits. Surgical changes, devices, tubes and lines: There is retrograde opacification of the proximal small bowel loop, likely proximal duodenum with positive contrast. There is no opacification of stomach. No frank extravasation of contrast outside the presumed confines of the bowel, to suggest frank leak. IMPRESSION: Nonobstructive bowel gas pattern. There is retrograde opacification of the proximal small bowel loop, likely proximal duodenum with positive contrast. There is no opacification of stomach. No frank extravasation of contrast outside the presumed confines of the bowel, to suggest frank leak. Electronically Signed   By: Ree Molt M.D.   On: 02/06/2024 08:07   CT ABDOMEN PELVIS W CONTRAST Result Date: 01/30/2024 EXAM: CT  ABDOMEN AND PELVIS WITH CONTRAST 01/30/2024 01:52:00 PM TECHNIQUE: CT of the abdomen and pelvis was performed with the administration of intravenous contrast. Multiplanar reformatted images are provided for review. Automated exposure control, iterative reconstruction, and/or weight-based adjustment of the mA/kV was utilized to reduce the radiation dose to as low as reasonably achievable. COMPARISON: 01/27/2024  CLINICAL HISTORY: Gastric pneumatosis. FINDINGS: LOWER CHEST: Minimal dependent atelectasis at the left lung base. LIVER: Normal size and contour. GALLBLADDER AND BILE DUCTS: No wall thickening. No cholelithiasis. No biliary ductal dilatation. SPLEEN: Normal size. No focal lesion. PANCREAS: Scattered coarse calcifications in the pancreatic head and body as before. Dilated pancreatic duct. Multilocular cystic process anterior to the pancreatic head, abutting the gastric antrum, measured approximately 4.2 cm maximum in transverse diameter, relatively stable from prior study, consistent with pseudocysts. ADRENAL GLANDS: Normal appearance. No mass. KIDNEYS, URETERS AND BLADDER: No stones in the kidneys or ureters. No hydronephrosis. No perinephric or periureteral stranding. Urinary bladder is unremarkable. GI AND BOWEL: Stomach is decompressed. No pneumatosis. Normal appendix. PERITONEUM AND RETROPERITONEUM: No ascites. No free air. VASCULATURE: Tapered narrowing of the splenic vein in the midline. Moderate narrowing of the superior mesenteric vein near the portal - splenic confluence without definite venous collateral filling. LYMPH NODES: No lymphadenopathy. REPRODUCTIVE ORGANS: No significant abnormality. BONES AND SOFT TISSUES: Sternotomy wires. Feeding tube to the proximal jejunum. No acute osseous abnormality. No focal soft tissue abnormality. IMPRESSION: 1. No evidence of gastric pneumatosis. 2. Stable multilocular cystic process anterior to the pancreatic head, consistent with pseudocysts. 3. Tapered narrowing of the splenic vein in the midline and moderate narrowing of the superior mesenteric vein near the portal-splenic confluence without definite venous collaterals. Electronically signed by: Katheleen Faes MD 01/30/2024 04:40 PM EDT RP Workstation: [id number]   US  EKG SITE RITE Result Date: 01/28/2024 If Site Rite image not attached, placement could not be confirmed due to current cardiac rhythm.  DG Abd  Portable 1V Result Date: 01/27/2024 CLINICAL DATA:  Feeding tube placement. EXAM: PORTABLE ABDOMEN - 1 VIEW COMPARISON:  01/21/2024 FINDINGS: Feeding tube tip is in the distal duodenum at the ligament of Treitz. Visualized upper abdomen demonstrates nonspecific bowel gas pattern. IMPRESSION: Feeding tube tip is in the distal duodenum at the ligament of Treitz. Electronically Signed   By: Camellia Candle M.D.   On: 01/27/2024 10:50   CT ABDOMEN PELVIS W WO CONTRAST Addendum Date: 01/27/2024 ADDENDUM REPORT: 01/27/2024 05:49 ADDENDUM: I discussed the results of this study directly with Dr. Charlton by telephone at approximately 0549 hours on 01/27/2024. Electronically Signed   By: Camellia Candle M.D.   On: 01/27/2024 05:49   Result Date: 01/27/2024 CLINICAL DATA:  Pancreatitis. EXAM: CT ABDOMEN AND PELVIS WITHOUT AND WITH CONTRAST TECHNIQUE: Multidetector CT imaging of the abdomen and pelvis was performed following the standard protocol before and following the bolus administration of intravenous contrast. RADIATION DOSE REDUCTION: This exam was performed according to the departmental dose-optimization program which includes automated exposure control, adjustment of the mA and/or kV according to patient size and/or use of iterative reconstruction technique. CONTRAST:  OMNIPAQUE  IOHEXOL  350 MG/ML SOLN COMPARISON:  01/23/2024 MRI.  01/22/2024 CT scan. FINDINGS: Lower chest: No acute findings. Hepatobiliary: No suspicious focal abnormality within the liver parenchyma. There is no evidence for gallstones, gallbladder wall thickening, or pericholecystic fluid. No intrahepatic or extrahepatic biliary dilation. Pancreas: Marked dilatation of the main pancreatic duct noted measuring 12 mm diameter today compared to about 11 mm diameter 1 measured again on the  previous CT. Scattered calcification noted in the parenchyma of the body and tail the pancreas as before compatible with sequelae of chronic pancreatitis. Bilobed  3.5 x 1.9 cm fluid collection anterior to the pancreatic head has increased from 2.3 x 1.3 cm previously. The fluid collection identified on previous MRI between the pancreatic tail and lesser curvature of the stomach measures 5.7 x 2.0 cm today compared to 6.9 x 2.3 cm on the previous MRI. There is thickening in the distal stomach and duodenum with perigastric compared duodenal fluid as before. Small fluid collection seen along the inferior capsule of the lateral segment left liver, similar to prior. Pancreatic parenchyma enhances throughout. Spleen: No splenomegaly. No suspicious focal mass lesion. Adrenals/Urinary Tract: No adrenal nodule or mass. Kidneys unremarkable. No evidence for hydroureter. The urinary bladder appears normal for the degree of distention. Stomach/Bowel: Fundal diverticulum noted. Gastric fundus and body is distended with fluid. No visible NG tube on this exam. Areas of curvilinear gas are seen between the dependent intraluminal gastric fluid in the wall of the stomach in the posterior and lateral portions of the fundus, concerning for pneumatosis although gas trapped between the gastric contents in the wall could potentially have this appearance. Distal stomach and duodenum are involved by edema/fluid/inflammation with associated luminal narrowing. Ampulla is prominent, bulging into the lumen of the duodenum. Transverse duodenum is more preserved. No small bowel wall thickening. No small bowel dilatation. The terminal ileum is normal. The appendix is normal. No gross colonic mass. No colonic wall thickening. Vascular/Lymphatic: No abdominal aortic aneurysm. No abdominal aortic atherosclerotic calcification. Portal vein is markedly attenuated in the region of the portal splenic confluence the does appear to remain patent although there is just a string sign of lumen visible as the SM V enters the portal venous anatomy. The central splenic vein is also markedly attenuated at the confluence  but splenic vein remains patent. Celiac axis, splenic artery, and SMA are patent without evidence for pseudoaneurysm. Scattered small abdominal lymph nodes evident. No pelvic sidewall lymphadenopathy. Reproductive: The prostate gland and seminal vesicles are unremarkable. Other: Scattered tiny foci of extraluminal gas are identified in the anterior abdomen. 1 collection is seen just deep to the right rectus musculature on axial 93/5. This was present previously and may be within the preperitoneal space as opposed to truly intraperitoneal in location although intraperitoneal gas is a possibility. Another collection is seen along the left rectus musculature but appears to be within the left rectus sheath (99/5). Both of these findings were present previously with some subcutaneous gas in the anterior abdominal wall on the previous CT presumably reflecting injection sites. Trace free fluid is seen in the pelvis. Musculoskeletal: No worrisome lytic or sclerotic osseous abnormality. IMPRESSION: 1. Areas of curvilinear gas density are seen between the dependent intraluminal gastric fluid and the wall of the stomach in the posterior and lateral portions of the fundus, concerning for pneumatosis. The possibility that this represents a layer of gas trapped between the gastric contents and the wall is not entirely excluded, but gas is tracking along the dependent wall the stomach which would be somewhat unusual. 2. Marked dilatation of the main pancreatic duct measuring 12 mm diameter today compared to about 11 mm diameter previously. Pancreatic parenchyma enhances throughout. 3. The fluid collection identified on previous MRI between the pancreatic tail and lesser curvature of the stomach measures 5.7 x 2.0 cm today compared to 6.9 x 2.3 cm on the previous MRI. 4. Bilobed fluid collection anterior  to the pancreatic head has increased from 2.3 x 1.3 cm previously. 5. Marked attenuation of the portal vein in the region of the  portal splenic confluence, but portal vein remains patent. The central splenic vein is also markedly attenuated at the confluence but splenic vein remains patent. 6. Scattered tiny foci of extraluminal gas in the anterior abdomen. The volume of this trace gas appear stable to minimally decreased in the interval since prior CT. One collection is seen just deep to the right rectus musculature on axial 93/5. This was present on the previous CT and may be within the preperitoneal space as opposed to truly intraperitoneal in location although intraperitoneal gas is a possibility. Another collection is seen along the left rectus musculature but appears to be within the left rectus sheath. Both of these findings were present previously. 7. Trace free fluid in the pelvis. Electronically Signed: By: Camellia Candle M.D. On: 01/27/2024 05:28   MR ABDOMEN MRCP W WO CONTAST Result Date: 01/23/2024 CLINICAL DATA:  Pancreatitis EXAM: MRI ABDOMEN WITHOUT AND WITH CONTRAST (INCLUDING MRCP) TECHNIQUE: Multiplanar multisequence MR imaging of the abdomen was performed both before and after the administration of intravenous contrast. Heavily T2-weighted images of the biliary and pancreatic ducts were obtained, and three-dimensional MRCP images were rendered by post processing. CONTRAST:  6.5mL GADAVIST  GADOBUTROL  1 MMOL/ML IV SOLN COMPARISON:  CT abdomen pelvis, 01/22/2024 FINDINGS: Lower chest: No acute abnormality. Hepatobiliary: No solid liver abnormality is seen. No gallstones, gallbladder wall thickening, or biliary dilatation. Pancreas: Diffuse inflammatory fat stranding about the pancreas. Severe pancreatic ductal dilatation measuring up to 1.0 cm in caliber in the pancreatic neck, the pancreatic duct effaced within the central pancreatic head (series 3, image 22). Interval development of an elongated fluid collection superior to the pancreatic body and tail and adjacent to the lesser curvature of the stomach, measuring 6.9 x  2.3 x 3.0 cm (series 3, image 18, series 2, image 22). Unchanged heterogeneous fluid collection ventral to the pancreatic head, involving the gastric antrum and pylorus measuring 3.0 x 2.1 x 3.0 cm (series 3, image 23, series 2, image 22) Spleen: Normal in size without significant abnormality. Adrenals/Urinary Tract: Adrenal glands are unremarkable. Kidneys are normal, without renal calculi, solid lesion, or hydronephrosis. Stomach/Bowel: Diverticulum of the gastric fundus. Very extensive, severe inflammatory fat stranding and wall thickening of the gastric antrum, pylorus, and proximal duodenal with associated complex fluid collection as described above (series 3, image 25). No evidence of bowel wall thickening, distention, or inflammatory changes. Vascular/Lymphatic: No significant vascular findings are present. No enlarged abdominal lymph nodes. Other: No abdominal wall hernia or abnormality. Small volume ascites. Musculoskeletal: No acute or significant osseous findings. IMPRESSION: 1. Diffuse inflammatory fat stranding about the pancreas, consistent with acute pancreatitis. 2. Severe pancreatic ductal dilatation measuring up to 1.0 cm in caliber in the pancreatic neck, the pancreatic duct effaced within the central pancreatic head. 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 3. Since earlier same day examination, interval development of an elongated acute pancreatic fluid collection superior to the pancreatic body and tail and adjacent to the lesser curvature of the stomach, measuring 6.9 x 2.3 x 3.0 cm. 4. Unchanged heterogeneous fluid collection ventral to the pancreatic head, involving the severely inflamed gastric antrum and pylorus measuring 3.0 x 2.1 x 3.0 cm, consistent with acute pancreatic fluid collection and concerning for ulceration to the bowel lumen given size, complexity, and significant involvement of the bowel wall. 5. Presence or absence of infection  within the above  described fluid collections is not established by imaging. 6. Small volume ascites. Electronically Signed   By: Marolyn JONETTA Jaksch M.D.   On: 01/23/2024 22:08   MR 3D Recon At Scanner Result Date: 01/23/2024 CLINICAL DATA:  Pancreatitis EXAM: MRI ABDOMEN WITHOUT AND WITH CONTRAST (INCLUDING MRCP) TECHNIQUE: Multiplanar multisequence MR imaging of the abdomen was performed both before and after the administration of intravenous contrast. Heavily T2-weighted images of the biliary and pancreatic ducts were obtained, and three-dimensional MRCP images were rendered by post processing. CONTRAST:  6.5mL GADAVIST  GADOBUTROL  1 MMOL/ML IV SOLN COMPARISON:  CT abdomen pelvis, 01/22/2024 FINDINGS: Lower chest: No acute abnormality. Hepatobiliary: No solid liver abnormality is seen. No gallstones, gallbladder wall thickening, or biliary dilatation. Pancreas: Diffuse inflammatory fat stranding about the pancreas. Severe pancreatic ductal dilatation measuring up to 1.0 cm in caliber in the pancreatic neck, the pancreatic duct effaced within the central pancreatic head (series 3, image 22). Interval development of an elongated fluid collection superior to the pancreatic body and tail and adjacent to the lesser curvature of the stomach, measuring 6.9 x 2.3 x 3.0 cm (series 3, image 18, series 2, image 22). Unchanged heterogeneous fluid collection ventral to the pancreatic head, involving the gastric antrum and pylorus measuring 3.0 x 2.1 x 3.0 cm (series 3, image 23, series 2, image 22) Spleen: Normal in size without significant abnormality. Adrenals/Urinary Tract: Adrenal glands are unremarkable. Kidneys are normal, without renal calculi, solid lesion, or hydronephrosis. Stomach/Bowel: Diverticulum of the gastric fundus. Very extensive, severe inflammatory fat stranding and wall thickening of the gastric antrum, pylorus, and proximal duodenal with associated complex fluid collection as described above (series 3, image 25). No  evidence of bowel wall thickening, distention, or inflammatory changes. Vascular/Lymphatic: No significant vascular findings are present. No enlarged abdominal lymph nodes. Other: No abdominal wall hernia or abnormality. Small volume ascites. Musculoskeletal: No acute or significant osseous findings. IMPRESSION: 1. Diffuse inflammatory fat stranding about the pancreas, consistent with acute pancreatitis. 2. Severe pancreatic ductal dilatation measuring up to 1.0 cm in caliber in the pancreatic neck, the pancreatic duct effaced within the central pancreatic head. 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 3. Since earlier same day examination, interval development of an elongated acute pancreatic fluid collection superior to the pancreatic body and tail and adjacent to the lesser curvature of the stomach, measuring 6.9 x 2.3 x 3.0 cm. 4. Unchanged heterogeneous fluid collection ventral to the pancreatic head, involving the severely inflamed gastric antrum and pylorus measuring 3.0 x 2.1 x 3.0 cm, consistent with acute pancreatic fluid collection and concerning for ulceration to the bowel lumen given size, complexity, and significant involvement of the bowel wall. 5. Presence or absence of infection within the above described fluid collections is not established by imaging. 6. Small volume ascites. Electronically Signed   By: Marolyn JONETTA Jaksch M.D.   On: 01/23/2024 22:08   CT ABDOMEN PELVIS W CONTRAST Result Date: 01/22/2024 CLINICAL DATA:  Persistent acute on chronic pancreatitis, recurrent nausea and gastric distension EXAM: CT ABDOMEN AND PELVIS WITH CONTRAST TECHNIQUE: Multidetector CT imaging of the abdomen and pelvis was performed using the standard protocol following bolus administration of intravenous contrast. RADIATION DOSE REDUCTION: This exam was performed according to the departmental dose-optimization program which includes automated exposure control, adjustment of the mA and/or kV  according to patient size and/or use of iterative reconstruction technique. CONTRAST:  75mL OMNIPAQUE  IOHEXOL  350 MG/ML SOLN COMPARISON:  CT abdomen pelvis January 11, 2024  FINDINGS: Lower chest: No suspicious nodule. Sternotomy. No pleural effusion. Hepatobiliary: Focal fat in segment 4 typical location. Subhepatic fluid collection is slightly decreased to prior. Diminished fat stranding and fluid collection in porta hepatis. Gallbladder is unremarkable.  No intrahepatic biliary dilatation. Pancreas: Again seen there is significant dilation of the pancreatic duct up to 10.6 mm, similar to prior. Multiple foci of coarse calcifications within the pancreatic head suggestive of chronic pancreatitis. Diffuse enhancement throughout the pancreas without filling defect. Peripancreatic fat stranding and fluid collection extending to perigastric is similar to slightly improved to prior. There is a rim enhancing hour glass shaped low-density/multiloculated cystic structure measuring 3.6 x 2.1 cm along the anterior aspect of the pancreatic head extending toward the posterior aspect of gastric antrum. Extension to gastric wall cannot be excluded on current exam. On review of prior images this finding was barely visible and was measured approximately 1 cm. (3/35, 33) Spleen: Normal spleen. Adrenals/Urinary Tract: Adrenal glands are unremarkable. No hydronephrosis. Both kidneys are appearing normal. Under distended otherwise unremarkable bladder. Stomach and bowel: Interval placement of gastrojejunostomy enteric tube tip terminating in proximal jejunal loops. Stomach is not distended. Perigastric inflammatory changes and fluid collection slightly improved to prior. Inflammatory changes and fat stranding identified along the omentum. Small and large bowel loops are unremarkable. Appendix is normal. Vascular/Lymphatic/ascites: Trace free fluid in pelvic cavity and perihepatic, decreased to prior. Persistent mild narrowing of the  splenic vein just proximal to the confluence, otherwise patent. Portal vein is patent. Remainder of the vascular structures are unremarkable without narrowing or obstruction. No suspicious lymphadenopathy. Reproductive: Unremarkable prostate. Musculoskeletal: No suspicious osseous lesions. IMPRESSION: 1. Dilation of the pancreatic duct and coarse calcifications in the pancreatic head consistent with stigmata of chronic pancreatitis, similar to prior. Normal pancreatic enhancement. 2. Interval increase in size of a multiloculated fluid collection with rim enhancement anterior to the pancreatic neck extending to posterior wall of the gastric antrum , most consistent with peripancreatic abscess formation. Gastric wall connection cannot be excluded. Recommend future follow-ups with dedicated MRCP with contrast if clinically warranted. 3.  Decreased ascites and perigastric/perihepatic free fluid. 4. Gastrojejunostomy tube terminates in proximal jejunum. Normal gastric size. Electronically Signed   By: Megan  Zare M.D.   On: 01/22/2024 15:01   DG Abd 2 Views Result Date: 01/21/2024 CLINICAL DATA:  Nausea.  Left-sided abdominal pain. EXAM: ABDOMEN - 2 VIEW COMPARISON:  Radiographs 01/15/2024.  CT 01/11/2024. FINDINGS: The stomach is distended with ingested material and fluid. Enteric tube follows a course consistent with tip near the ligament of Treitz. No significant bowel distension. No evidence of pneumoperitoneum. Pancreatic calcifications are again noted consistent with chronic pancreatitis. The bones appear unchanged. IMPRESSION: The stomach is distended with ingested material and fluid. No evidence of bowel obstruction or pneumoperitoneum. Electronically Signed   By: Elsie Perone M.D.   On: 01/21/2024 13:59      Subjective:   Discharge Exam: Vitals:   02/17/24 0859 02/17/24 1142  BP: 92/66 114/79  Pulse: 70 65  Resp: 18 18  Temp: (!) 97.3 F (36.3 C) 97.7 F (36.5 C)  SpO2: 95% 99%     General: Pt is alert, awake, not in acute distress Cardiovascular: rate controlled, S1/S2 + Respiratory: bilateral decreased breath sounds at bases Abdominal: Soft, NT, ND, bowel sounds + Extremities: no edema, no cyanosis    The results of significant diagnostics from this hospitalization (including imaging, microbiology, ancillary and laboratory) are listed below for reference.     Microbiology:  No results found for this or any previous visit (from the past 240 hours).   Labs: BNP (last 3 results) No results for input(s): BNP in the last 8760 hours. Basic Metabolic Panel: Recent Labs  Lab 02/16/24 0822  NA 138  K 4.0  CL 101  CO2 26  GLUCOSE 136*  BUN 12  CREATININE 0.62  CALCIUM  8.9  PHOS 3.8   Liver Function Tests: No results for input(s): AST, ALT, ALKPHOS, BILITOT, PROT, ALBUMIN  in the last 168 hours. No results for input(s): LIPASE, AMYLASE in the last 168 hours. No results for input(s): AMMONIA in the last 168 hours. CBC: Recent Labs  Lab 02/13/24 0145 02/14/24 0413 02/15/24 0450 02/16/24 0154 02/17/24 0405  WBC 7.8 7.9 4.7 6.5 4.0  HGB 11.1* 11.4* 10.9* 11.0* 11.8*  HCT 34.0* 34.3* 34.1* 34.5* 37.3*  MCV 87.4 85.5 88.3 87.6 87.8  PLT 338 331 326 301 314   Cardiac Enzymes: No results for input(s): CKTOTAL, CKMB, CKMBINDEX, TROPONINI in the last 168 hours. BNP: Invalid input(s): POCBNP CBG: Recent Labs  Lab 02/16/24 1147 02/16/24 1830 02/17/24 0003 02/17/24 0416 02/17/24 1203  GLUCAP 125* 134* 134* 127* 101*   D-Dimer No results for input(s): DDIMER in the last 72 hours. Hgb A1c No results for input(s): HGBA1C in the last 72 hours. Lipid Profile No results for input(s): CHOL, HDL, LDLCALC, TRIG, CHOLHDL, LDLDIRECT in the last 72 hours. Thyroid function studies No results for input(s): TSH, T4TOTAL, T3FREE, THYROIDAB in the last 72 hours.  Invalid input(s): FREET3 Anemia work  up No results for input(s): VITAMINB12, FOLATE, FERRITIN, TIBC, IRON, RETICCTPCT in the last 72 hours. Urinalysis    Component Value Date/Time   COLORURINE YELLOW 01/11/2024 0326   APPEARANCEUR CLOUDY (A) 01/11/2024 0326   LABSPEC 1.012 01/11/2024 0326   PHURINE 8.0 01/11/2024 0326   GLUCOSEU NEGATIVE 01/11/2024 0326   HGBUR NEGATIVE 01/11/2024 0326   BILIRUBINUR NEGATIVE 01/11/2024 0326   KETONESUR NEGATIVE 01/11/2024 0326   PROTEINUR NEGATIVE 01/11/2024 0326   NITRITE NEGATIVE 01/11/2024 0326   LEUKOCYTESUR NEGATIVE 01/11/2024 0326   Sepsis Labs Recent Labs  Lab 02/14/24 0413 02/15/24 0450 02/16/24 0154 02/17/24 0405  WBC 7.9 4.7 6.5 4.0   Microbiology No results found for this or any previous visit (from the past 240 hours).   Time coordinating discharge: 35 minutes  SIGNED:   Twilla Khouri, MD  [facility] 02/17/2024, 1:56 PM

## 2024-02-17 NOTE — TOC Transition Note (Signed)
 Transition of Care [facility]) - Discharge Note   Patient Details  Name: [name] [name] MRN: [id number] Date of Birth: [birth year]  [facility]) CM/SW Contact:  [name] JONELLE Joe, RN Phone Number: 02/17/2024, 12:02 PM   Clinical Narrative:    CM spoke with Holley Herring, RNCM with [employer] and enteral feeds, feeding pump and equipment will be delivered to the home today at 6 pm.  Patient can be discharged home by bedside nursing when the patient's arrive to take him home by car.  Bedside nursing is aware.         Patient Goals and CMS Choice            Discharge Placement                       Discharge Plan and Services Additional resources added to the After Visit Summary for                                       Social Drivers of Health (SDOH) Interventions SDOH Screenings   Food Insecurity: No Food Insecurity (01/11/2024)  Housing: Low Risk  (01/11/2024)  Transportation Needs: No Transportation Needs (01/11/2024)  Utilities: Not At Risk (01/11/2024)  Depression (PHQ2-9): Medium Risk (09/09/2023)  Financial Resource Strain: Low Risk  (04/25/2023)  Physical Activity: Sufficiently Active (04/25/2023)  Social Connections: Unknown (04/25/2023)  Stress: No Stress Concern Present (04/25/2023)  Tobacco Use: Low Risk  (01/20/2024)     Readmission Risk Interventions    01/29/2024   12:53 PM 01/15/2024   11:03 AM  Readmission Risk Prevention Plan  Post Dischage Appt  Complete  Medication Screening  Complete  Transportation Screening Complete Complete  PCP or Specialist Appt within 5-7 Days Complete   Home Care Screening Complete   Medication Review (RN CM) Complete

## 2024-02-17 NOTE — Progress Notes (Signed)
 Nutrition Note  Spoke to pt at bedside. Pt plan to d/c home today with enteral nutrition via [employer]. Pt denies s/sx of EN intolerance over the weekend with increased rate to 105 ml/hr x 16 hrs via PEG/J to allow for 8 hrs off. Increased free water  flushes to 100 ml q-4 (1700, 2100, 0100, 0500, 0900). Pt states tube feed management education given by Holley from [employer] on Friday. Pt denies additional questions/concerns at this time, will continue to monitor, RDN available prn.   INTERVENTION:  -Change enteral nutrition to match pt's home regimen of:             Osmolite 1.5 continuous @ goal rate 105 ml/hr x 16 hrs per day.  At goal this provides 2520 kcal, 105 g pro, 1277 ml fluid.    -Free water  flushes 100 ml q-4 to provide fluid total of 1777 ml  (flushes at hr 0, 4, 8, 12, 16). Expect pt to meet fluid goal of >/=2L with PO intake-will assess Monday morning before possible d/c   -Pt will be using [employer] for EN at home, [employer] equivalent formula to be used, which is Nutren 1.5.              At goal this will provide 2520 kcal, 114 g protein, 1784 ml fluid w/ flushes    Ilija Maxim Daml-Budig, RDN, LDN Registered Dietitian Nutritionist RD Inpatient Contact Info in [place]

## 2024-02-17 NOTE — Plan of Care (Signed)

## 2024-02-26 ENCOUNTER — Telehealth: Payer: Self-pay | Admitting: Family

## 2024-02-26 NOTE — Telephone Encounter (Signed)
 [facility] faxed Home Health Certificate (Order [place] [id number]), to be filled out by provider. Patient requested to send it back via Fax within ASAP. Document is located in providers tray at front office.Please advise at [phone].

## 2024-02-26 NOTE — Telephone Encounter (Signed)
 Received

## 2024-03-02 ENCOUNTER — Telehealth: Payer: Self-pay | Admitting: Gastroenterology

## 2024-03-02 ENCOUNTER — Ambulatory Visit (INDEPENDENT_AMBULATORY_CARE_PROVIDER_SITE_OTHER): Admitting: Family

## 2024-03-02 ENCOUNTER — Encounter: Payer: Self-pay | Admitting: Family

## 2024-03-02 VITALS — BP 117/76 | HR 76 | Temp 97.3°F | Ht 71.0 in | Wt 150.2 lb

## 2024-03-02 DIAGNOSIS — K861 Other chronic pancreatitis: Secondary | ICD-10-CM

## 2024-03-02 DIAGNOSIS — E43 Unspecified severe protein-calorie malnutrition: Secondary | ICD-10-CM

## 2024-03-02 DIAGNOSIS — K859 Acute pancreatitis without necrosis or infection, unspecified: Secondary | ICD-10-CM

## 2024-03-02 DIAGNOSIS — Z23 Encounter for immunization: Secondary | ICD-10-CM | POA: Diagnosis not present

## 2024-03-02 DIAGNOSIS — G8929 Other chronic pain: Secondary | ICD-10-CM

## 2024-03-02 DIAGNOSIS — R109 Unspecified abdominal pain: Secondary | ICD-10-CM

## 2024-03-02 DIAGNOSIS — Z934 Other artificial openings of gastrointestinal tract status: Secondary | ICD-10-CM

## 2024-03-02 NOTE — Telephone Encounter (Addendum)
 Procedure:ERCP Procedure date: 03/09/24 Procedure location: [facility] Arrival Time: 1:45 pm Spoke with the patient Y/N:   No, I left a detailed message on [phone] on 03/02/24 @ 9:48 am for the patient to return call    Any prep concerns? ___  Has the patient obtained the prep from the pharmacy ? ___ Do you have a care partner and transportation: ___ Any additional concerns? ___

## 2024-03-02 NOTE — Telephone Encounter (Signed)
 Inbound call from patient stating he had received a call from our office. Patient was advised that we were going to advise him of his procedure being done at the [facility] on September the 29 th with an arrival time of 1:45 PM. Patient advised the that he was not sure about the time and appreciates the call to him to inform him of the time he needed to be there. Patient has no questing in regards to his prep. Patient does have a care partner and transportation. Please advise.

## 2024-03-02 NOTE — Progress Notes (Unsigned)
 Patient ID: [name] [name], male    DOB: [birth year], 33 y.o.   MRN: [id number]  Chief Complaint  Patient presents with  . Hospitalization Follow-up    8/2-9/8 for Acute on chronic pancreatitis, Pt c/o pain, Has tried   Discussed the use of AI scribe software for clinical note transcription with the patient, who gave verbal consent to proceed.  History of Present Illness   [name] [name] is a 33 year old male with pancreatic pseudocyst who presents for follow-up regarding his feeding tube and pain management.  He manages a feeding tube due to significant weight loss and nutritional challenges, receiving liquid nutrition over sixteen hours via a pump. His weight has increased by four to five pounds. Sleeping is difficult due to the need to remain inclined, causing discomfort and pain.  Abdominal pain persists with a baseline of four out of ten, occasionally rising to six. Tylenol  is used for pain management, with stronger medication reserved for severe episodes. Pain has not exceeded six since the last hospital discharge. Oxycodone  is available but infrequently needed.  He takes Creon  with oral intake but not with tube feedings. Bowel movements occur every two days, considered normal, with Miralax  used as needed to prevent constipation, especially when using oxycodone . He is no longer on Lovenox  and has paused Truvada and Bentyl .  He experienced significant weight loss due to a pseudocyst at the head of the pancreas and a recent bout of gastroparesis, necessitating the feeding tube. He is working on weight gain and staying hydrated with fluids like hot tea and broth, though fluid intake is challenging. Coffee is consumed occasionally, with awareness of its dehydrating effects.     Assessment and Plan    Chronic pancreatitis with pancreatic pseudocyst Chronic pancreatitis with pseudocyst at pancreatic head. Awaiting ERCP next week for potential stent  placement and stone removal. May try to remove pseudocyst as well. Met w/surgeon & surgery considered if ERCP unsuccessful to remove the pancreatic head/  - Proceed with ERCP for potential stent placement or stone removal. - Discuss surgical options if ERCP is unsuccessful. - Call office if pain meds not offered after procedure.  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 Methocarbamol  ineffective. Bentyl  on hold, to be discussed with GI post-ERCP. - Continue Tylenol  for pain management. - Use oxycodone  as needed for severe pain, ask GI for refill if needed after procedure. - Discuss Bentyl  use with GI post-ERCP. - Maintain elevated sleeping position to manage pain.  Malnutrition with weight loss Significant weight loss due to chronic pancreatitis. On enteral feeding with weight gain. Goal to increase weight by 15 pounds and reduce enteral feeding dependence. - Continue enteral feeding via jejunostomy tube. - Monitor weight gain and nutritional status. - Consider transitioning to oral intake as tolerated post-ERCP.  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 Regimen involves 7 cartons over 16 hours. Aiming to reduce feeding duration as weight and oral intake improve. - Continue current enteral feeding regimen. - Aim to reduce feeding duration as weight and oral intake improve.     Subjective:    Outpatient Medications Prior to Visit  Medication Sig Dispense Refill  . acetaminophen  (TYLENOL ) 500 MG tablet Take 1,000 mg by mouth every 6 (six) hours as needed for moderate pain (pain score 4-6).    . aspirin  EC 81 MG EC tablet Take 1 tablet (81 mg total) by mouth daily. Swallow whole.  30 tablet 11  . hydrOXYzine  (ATARAX ) 25 MG tablet Take 1 tablet (25 mg total) by mouth 3 (three) times daily as needed for anxiety. 30 tablet 0  . lipase/protease/amylase (CREON ) 36000 UNITS CPEP capsule Take 2 capsules (72,000  Units total) by mouth 3 (three) times daily before meals.    . loratadine  (CLARITIN ) 10 MG tablet Take 10 mg by mouth daily.    . metoprolol  succinate (TOPROL  XL) 25 MG 24 hr tablet Take 0.5 tablets (12.5 mg total) by mouth at bedtime. 45 tablet 2  . ondansetron  (ZOFRAN -ODT) 4 MG disintegrating tablet Take 4 mg by mouth every 8 (eight) hours as needed.    . oxyCODONE  (OXY IR/ROXICODONE ) 5 MG immediate release tablet Take 5 mg by mouth.    . oxyCODONE -acetaminophen  (PERCOCET/ROXICET) 5-325 MG tablet Place 2 tablets into feeding tube every 4 (four) hours as needed for moderate pain (pain score 4-6) or severe pain (pain score 7-10). 30 tablet 0  . warfarin (COUMADIN ) 3 MG tablet TAKE 1 & 1/2 TABLETS TO 2 TABLETS BY MOUTH DAILY OR AS DIRECTED BY COAGULATION CLINIC 140 tablet 0  . emtricitabine -tenofovir  (TRUVADA) 200-300 MG tablet Take 1 tablet by mouth daily. (Patient not taking: Reported on 01/11/2024)    . enoxaparin  (LOVENOX ) 60 MG/0.6ML injection Inject 0.6 mLs (60 mg total) into the skin every 12 (twelve) hours for 7 days. 8.4 mL 0  . methocarbamol  (ROBAXIN ) 500 MG tablet Take 1-2 tablets (500-1,000 mg total) by mouth 4 (four) times daily. (Patient taking differently: Take 500 mg by mouth daily as needed for muscle spasms.) 30 tablet 0  . polyethylene glycol powder (GLYCOLAX /MIRALAX ) 17 GM/SCOOP powder Place 1 capful (17 g) into feeding tube 2 (two) times daily. 238 g 0   No facility-administered medications prior to visit.   Past Medical History:  Diagnosis Date  . Asthma   . Headache    migraines  . Heart murmur   . Hypokalemia 06/08/2020  . Leukocytosis 01/23/2022  . Mild aortic stenosis   . Pancreatitis   . Pneumonia    as a child x several  . Substance abuse [facility])    Past Surgical History:  Procedure Laterality Date  . AORTIC VALVE REPLACEMENT N/A 09/27/2021   Procedure: AORTIC VALVE REPLACEMENT (AVR) USING ON-X AORTIC VALVE SIZE ;  Surgeon: Kerrin Elspeth BROCKS, MD;   Location: [facility] OR;  Service: Open Heart Surgery;  Laterality: N/A;  . BIOPSY  08/01/2023   Procedure: BIOPSY;  Surgeon: Wilhelmenia Aloha Raddle., MD;  Location: [facility];  Service: Gastroenterology;;  . ESOPHAGOGASTRODUODENOSCOPY N/A 08/01/2023   Procedure: ESOPHAGOGASTRODUODENOSCOPY (EGD);  Surgeon: Wilhelmenia Aloha Raddle., MD;  Location: [facility];  Service: Gastroenterology;  Laterality: N/A;  . EUS N/A 08/01/2023   Procedure: UPPER ENDOSCOPIC ULTRASOUND (EUS) RADIAL;  Surgeon: Wilhelmenia Aloha Raddle., MD;  Location: [facility];  Service: Gastroenterology;  Laterality: N/A;  . FINE NEEDLE ASPIRATION N/A 08/01/2023   Procedure: FINE NEEDLE ASPIRATION (FNA) LINEAR;  Surgeon: Wilhelmenia Aloha Raddle., MD;  Location: [facility];  Service: Gastroenterology;  Laterality: N/A;  . INGUINAL HERNIA REPAIR Right 01/05/2022   Procedure: OPEN REPAIR INCARCERATED RIGHT INGUINAL HERNIA WITH MESH;  Surgeon: Tanda Locus, MD;  Location: [facility];  Service: General;  Laterality: Right;  90 MIN - ROOM 2  . INGUINAL HERNIA REPAIR Left 12/20/2022   Procedure: OPEN REPAIR LEFT INGUINAL HERNIA WITH MESH;  Surgeon: Tanda Locus, MD;  Location: [facility];  Service: General;  Laterality: Left;  . IR GASTR TUBE CONVERT GASTR-JEJ  PER W/FL MOD SED  02/13/2024  . IR GASTROSTOMY TUBE MOD SED  02/13/2024  . REPLACEMENT ASCENDING AORTA N/A 09/27/2021   Procedure: REPLACEMENT ASCENDING AND INNOMINATE ANEURYSM USING HEMASHIELD PLATINUM [id number];  Surgeon: Kerrin Elspeth BROCKS, MD;  Location: [facility] OR;  Service: Open Heart Surgery;  Laterality: N/A;  . RIGHT HEART CATH AND CORONARY ANGIOGRAPHY N/A 09/07/2021   Procedure: RIGHT HEART CATH AND CORONARY ANGIOGRAPHY;  Surgeon: Wendel Lurena POUR, MD;  Location: [facility];  Service: Cardiovascular;  Laterality: N/A;  . TEE WITHOUT CARDIOVERSION N/A 09/27/2021   Procedure: TRANSESOPHAGEAL ECHOCARDIOGRAM (TEE);  Surgeon: Kerrin Elspeth BROCKS, MD;  Location: [facility] OR;  Service: Open  Heart Surgery;  Laterality: N/A;   Allergies  Allergen Reactions  . Amoxicillin Hives      Objective:    Physical Exam Vitals and nursing note reviewed.  Constitutional:      General: He is not in acute distress.    Appearance: Normal appearance.  HENT:     Head: Normocephalic.  Cardiovascular:     Rate and Rhythm: Normal rate and regular rhythm.  Pulmonary:     Effort: Pulmonary effort is normal.     Breath sounds: Normal breath sounds.  Musculoskeletal:        General: Normal range of motion.     Cervical back: Normal range of motion.  Skin:    General: Skin is warm and dry.  Neurological:     Mental Status: He is alert and oriented to person, place, and time.  Psychiatric:        Mood and Affect: Mood normal.    BP 117/76 (BP Location: Left Arm, Patient Position: Sitting, Cuff Size: Normal)   Pulse 76   Temp (!) 97.3 F (36.3 C) (Temporal)   Ht 5' 11 (1.803 m)   Wt 150 lb 3.2 oz (68.1 kg)   SpO2 100%   BMI 20.95 kg/m  Wt Readings from Last 3 Encounters:  03/02/24 150 lb 3.2 oz (68.1 kg)  02/17/24 146 lb 2.6 oz (66.3 kg)  12/24/23 157 lb (71.2 kg)      Lucius Krabbe, NP

## 2024-03-02 NOTE — Telephone Encounter (Signed)
 Noted! Thank you

## 2024-03-03 ENCOUNTER — Emergency Department (HOSPITAL_COMMUNITY)
Admission: EM | Admit: 2024-03-03 | Discharge: 2024-03-04 | Disposition: A | Discharge status: Home / Self Care | Attending: Emergency Medicine | Admitting: Emergency Medicine

## 2024-03-03 ENCOUNTER — Other Ambulatory Visit: Payer: Self-pay

## 2024-03-03 ENCOUNTER — Encounter (HOSPITAL_COMMUNITY): Payer: Self-pay | Admitting: Gastroenterology

## 2024-03-03 ENCOUNTER — Ambulatory Visit: Admitting: Gastroenterology

## 2024-03-03 ENCOUNTER — Encounter: Payer: Self-pay | Admitting: Family

## 2024-03-03 DIAGNOSIS — K861 Other chronic pancreatitis: Secondary | ICD-10-CM | POA: Diagnosis not present

## 2024-03-03 DIAGNOSIS — Z7901 Long term (current) use of anticoagulants: Secondary | ICD-10-CM | POA: Diagnosis not present

## 2024-03-03 DIAGNOSIS — K859 Acute pancreatitis without necrosis or infection, unspecified: Secondary | ICD-10-CM | POA: Insufficient documentation

## 2024-03-03 DIAGNOSIS — Z7982 Long term (current) use of aspirin: Secondary | ICD-10-CM | POA: Insufficient documentation

## 2024-03-03 DIAGNOSIS — R1010 Upper abdominal pain, unspecified: Secondary | ICD-10-CM | POA: Diagnosis present

## 2024-03-03 DIAGNOSIS — K2950 Unspecified chronic gastritis without bleeding: Secondary | ICD-10-CM | POA: Insufficient documentation

## 2024-03-03 LAB — CBC
HCT: 36.5 % — ABNORMAL LOW (ref 39.0–52.0)
Hemoglobin: 12 g/dL — ABNORMAL LOW (ref 13.0–17.0)
MCH: 28.7 pg (ref 26.0–34.0)
MCHC: 32.9 g/dL (ref 30.0–36.0)
MCV: 87.3 fL (ref 80.0–100.0)
Platelets: 381 K/uL (ref 150–400)
RBC: 4.18 MIL/uL — ABNORMAL LOW (ref 4.22–5.81)
RDW: 14.6 % (ref 11.5–15.5)
WBC: 11.1 K/uL — ABNORMAL HIGH (ref 4.0–10.5)
nRBC: 0 % (ref 0.0–0.2)

## 2024-03-03 LAB — COMPREHENSIVE METABOLIC PANEL WITH GFR
ALT: 34 U/L (ref 0–44)
AST: 25 U/L (ref 15–41)
Albumin: 4.2 g/dL (ref 3.5–5.0)
Alkaline Phosphatase: 114 U/L (ref 38–126)
Anion gap: 13 (ref 5–15)
BUN: 5 mg/dL — ABNORMAL LOW (ref 6–20)
CO2: 25 mmol/L (ref 22–32)
Calcium: 9.4 mg/dL (ref 8.9–10.3)
Chloride: 101 mmol/L (ref 98–111)
Creatinine, Ser: 0.76 mg/dL (ref 0.61–1.24)
GFR, Estimated: >60 mL/min (ref >60)
Glucose, Bld: 112 mg/dL — ABNORMAL HIGH (ref 70–99)
Potassium: 3.7 mmol/L (ref 3.5–5.1)
Sodium: 139 mmol/L (ref 135–145)
Total Bilirubin: 1.1 mg/dL (ref 0.0–1.2)
Total Protein: 7.7 g/dL (ref 6.5–8.1)

## 2024-03-03 LAB — URINALYSIS, ROUTINE W REFLEX MICROSCOPIC
Bacteria, UA: NONE SEEN
Bilirubin Urine: NEGATIVE
Glucose, UA: NEGATIVE mg/dL
Hgb urine dipstick: NEGATIVE
Ketones, ur: 80 mg/dL — AB
Leukocytes,Ua: NEGATIVE
Nitrite: NEGATIVE
Protein, ur: NEGATIVE mg/dL
Specific Gravity, Urine: 1.017 (ref 1.005–1.030)
pH: 6 (ref 5.0–8.0)

## 2024-03-03 LAB — LIPASE, BLOOD: Lipase: 535 U/L — ABNORMAL HIGH (ref 11–51)

## 2024-03-03 NOTE — ED Provider Triage Note (Signed)
 Emergency Medicine Provider Triage Evaluation Note  [name] [name] , a 33 y.o. male  was evaluated in triage.  Pt complains of worsening abdominal pain.  He has a history of chronic pancreatitis, upcoming ERCP. he had a feeding tube placed about 2-1/2 weeks ago.  Patient states increasing pain this morning.  He takes liquids and medications by mouth.  He vomited up water .  States that he ran out of OxyContin  today.  No fevers.  No diarrhea.  He states that he is more nauseous currently than his typical for pancreatitis symptoms.  He has 7 out of 10 left-sided abdominal pain that radiates to the back.  Review of Systems  Positive: Abdominal pain, vomiting Negative: Fever  Physical Exam  BP (!) 138/93 (BP Location: Right Arm)   Pulse (!) 110   Temp 98.2 F (36.8 C)   Resp (!) 24   Ht 5' 11 (1.803 m)   Wt 68 kg   SpO2 98%   BMI 20.92 kg/m  Gen:   Awake, no distress   Resp:  Normal effort  MSK:   Moves extremities without difficulty  Other:  Mild tachycardia, Abd tender L>R, no rebound or guarding  Medical Decision Making  Medically screening exam initiated at 8:30 PM.  Appropriate orders placed.  [name] [name] was informed that the remainder of the evaluation will be completed by another provider, this initial triage assessment does not replace that evaluation, and the importance of remaining in the ED until their evaluation is complete.     Desiderio Chew, [name] 03/03/24 2033

## 2024-03-03 NOTE — Progress Notes (Signed)
 Attempted to obtain medical history for pre op call via telephone, unable to reach at this time. HIPAA compliant voicemail message left requesting return call to pre surgical testing department.

## 2024-03-03 NOTE — ED Triage Notes (Signed)
 Pt in POV with 7/10 upper abdominal pain, states hx of chronic pancreatitis with recent admission. Pain radiates to back, reports constant n/v

## 2024-03-03 NOTE — ED Triage Notes (Signed)
 PT complains of n/v and LUQ pain. Unable to keep down any medication.

## 2024-03-04 ENCOUNTER — Emergency Department (HOSPITAL_COMMUNITY)

## 2024-03-04 MED ORDER — OXYCODONE-ACETAMINOPHEN 5-325 MG PO TABS
1.0000 | ORAL_TABLET | Freq: Once | ORAL | Status: AC
  Administered 2024-03-04: 1
  Filled 2024-03-04: qty 1

## 2024-03-04 MED ORDER — ALUM & MAG HYDROXIDE-SIMETH 200-200-20 MG/5ML PO SUSP
30.0000 mL | Freq: Once | ORAL | Status: AC
Start: 2024-03-04 — End: 2024-03-04
  Administered 2024-03-04: 30 mL via ORAL
  Filled 2024-03-04: qty 30

## 2024-03-04 MED ORDER — ONDANSETRON HCL 4 MG/2ML IJ SOLN
INTRAMUSCULAR | Status: AC
  Filled 2024-03-04: qty 2

## 2024-03-04 MED ORDER — LACTATED RINGERS IV BOLUS
1000.0000 mL | Freq: Once | INTRAVENOUS | Status: AC
Start: 2024-03-04 — End: 2024-03-04
  Administered 2024-03-04: 1000 mL via INTRAVENOUS

## 2024-03-04 MED ORDER — LIDOCAINE VISCOUS HCL 2 % MT SOLN
15.0000 mL | Freq: Once | OROMUCOSAL | Status: AC
  Administered 2024-03-04: 15 mL via ORAL
  Filled 2024-03-04: qty 15

## 2024-03-04 MED ORDER — ACETAMINOPHEN 500 MG PO TABS
500.0000 mg | ORAL_TABLET | Freq: Once | ORAL | Status: AC
  Administered 2024-03-04: 500 mg
  Filled 2024-03-04: qty 1

## 2024-03-04 MED ORDER — ONDANSETRON HCL 4 MG/2ML IJ SOLN
4.0000 mg | Freq: Once | INTRAMUSCULAR | Status: AC
  Administered 2024-03-04: 4 mg via INTRAVENOUS

## 2024-03-04 MED ORDER — PANTOPRAZOLE SODIUM 40 MG IV SOLR
40.0000 mg | Freq: Once | INTRAVENOUS | Status: AC
  Administered 2024-03-04: 40 mg via INTRAVENOUS
  Filled 2024-03-04: qty 10

## 2024-03-04 MED ORDER — FENTANYL CITRATE PF 50 MCG/ML IJ SOSY
50.0000 ug | PREFILLED_SYRINGE | Freq: Once | INTRAMUSCULAR | Status: AC
  Administered 2024-03-04: 50 ug via INTRAVENOUS
  Filled 2024-03-04: qty 1

## 2024-03-04 MED ORDER — FENTANYL CITRATE PF 50 MCG/ML IJ SOSY
25.0000 ug | PREFILLED_SYRINGE | Freq: Once | INTRAMUSCULAR | Status: AC
  Administered 2024-03-04: 25 ug via INTRAVENOUS

## 2024-03-04 MED ORDER — FENTANYL CITRATE PF 50 MCG/ML IJ SOSY
PREFILLED_SYRINGE | INTRAMUSCULAR | Status: AC
  Filled 2024-03-04: qty 1

## 2024-03-04 MED ORDER — IOHEXOL 350 MG/ML SOLN
50.0000 mL | Freq: Once | INTRAVENOUS | Status: AC | PRN
  Administered 2024-03-04: 50 mL via INTRAVENOUS

## 2024-03-04 MED ORDER — PANTOPRAZOLE SODIUM 40 MG PO TBEC: 40.0000 mg | DELAYED_RELEASE_TABLET | Freq: Every day | ORAL | 0 refills | Status: DC

## 2024-03-04 MED ORDER — OXYCODONE HCL 5 MG PO TABS: 5.0000 mg | ORAL_TABLET | ORAL | 0 refills | Status: DC | PRN

## 2024-03-04 NOTE — Discharge Instructions (Addendum)
 Your labs and imaging today are consistent with acute on chronic pancreatitis (inflammation of the pancreas) and gastritis (inflammation of the stomach).   You were given medications today to help with your symptoms.  Please keep well-hydrated at home.  You may take up to 1000mg  of tylenol  every 6 hours as needed for pain.  Do not take more then 4g per day.  You have been prescribed Oxycodone -this is a narcotic/controlled substance medication that has potential addicting qualities.  You may take 1 tablet every 4-6 hours as needed for severe pain.  Do not drive or operate heavy machinery when taking this medicine as it can be sedating. Do not drink alcohol or take other sedating medications when taking this medicine for safety reasons.  Keep this out of reach of small children.    You have been prescribed a medication called Protonix  (pantoprazole ) for acid reflux and your gastritis.  Please take this once daily as prescribed.  Please attend your appointment for ERCP with Dr. Wilhelmenia on 03/09/2024.  Please make Dr. Wilhelmenia aware of the finding of gastritis on your CT scan so that he can further manage this.   Return to the ER if you develop uncontrolled/worsening pain, vomiting, fevers, any other new or concerning symptoms

## 2024-03-04 NOTE — ED Notes (Signed)
 J tube dressing changed with split gauze and tape.

## 2024-03-04 NOTE — ED Provider Notes (Signed)
 [place] [facility] Provider Note   CSN: [id number] Arrival date & time: 03/03/24  8040     Patient presents with: Abdominal Pain   [name] [name] is a 33 y.o. 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  [name] [name] states [name] [name] has been having some mild abdominal pain since his hospital discharge early this month, but last night his pain seemed to flare.  [name] [name] reports some nausea and nonbloody nonbilious emesis.  Denies any fever or chills.  Reports normal bowel movements.  No chest pain or shortness of breath.    Abdominal Pain      Prior to Admission medications   Medication Sig Start Date End Date Taking? Authorizing Provider  oxyCODONE  (ROXICODONE ) 5 MG immediate release tablet Place 1 tablet (5 mg total) into feeding tube every 4 (four) hours as needed for severe pain (pain score 7-10). 03/04/24  Yes Veta Palma, [name]  pantoprazole  (PROTONIX ) 40 MG tablet Take 1 tablet (40 mg total) by mouth daily. 03/04/24 04/03/24 Yes Veta Palma, [name]  acetaminophen  (TYLENOL ) 500 MG tablet Take 1,000 mg by mouth every 6 (six) hours as needed for moderate pain (pain score 4-6).    [provider]  aspirin  EC 81 MG EC tablet Take 1 tablet (81 mg total) by mouth daily. Swallow whole. 10/02/21   Barrett, Erin R, [name]  hydrOXYzine  (ATARAX ) 25 MG tablet Take 1 tablet (25 mg total) by mouth 3 (three) times daily as needed for anxiety. 02/17/24   Sigdel, Santosh, MD  lipase/protease/amylase (CREON ) 36000 UNITS CPEP capsule Take 2 capsules (72,000 Units total) by mouth 3 (three) times daily before meals. 12/09/23   Perri DELENA Meliton Mickey., MD  loratadine  (CLARITIN ) 10 MG tablet Take 10 mg by mouth daily.    [provider]  methocarbamol  (ROBAXIN ) 500 MG tablet Take 1-2 tablets (500-1,000 mg total) by mouth 4 (four) times  daily. Patient taking differently: Take 500 mg by mouth daily as needed for muscle spasms. 11/19/23   Lucius Krabbe, NP  metoprolol  succinate (TOPROL  XL) 25 MG 24 hr tablet Take 0.5 tablets (12.5 mg total) by mouth at bedtime. 07/10/23   Thukkani, Arun K, MD  ondansetron  (ZOFRAN -ODT) 4 MG disintegrating tablet Take 4 mg by mouth every 8 (eight) hours as needed. 02/24/24   [provider]  polyethylene glycol powder (GLYCOLAX /MIRALAX ) 17 GM/SCOOP powder Place 1 capful (17 g) into feeding tube 2 (two) times daily. 02/17/24   Sigdel, Santosh, MD  warfarin (COUMADIN ) 3 MG tablet TAKE 1 & 1/2 TABLETS TO 2 TABLETS BY MOUTH DAILY OR AS DIRECTED BY COAGULATION CLINIC 02/17/24   Mcarthur Pick, MD    Allergies: Amoxicillin    Review of Systems  Gastrointestinal:  Positive for abdominal pain.    Updated Vital Signs BP 130/77   Pulse 94   Temp 97.6 F (36.4 C) (Oral)   Resp 18   Ht 5' 11 (1.803 m)   Wt 68 kg   SpO2 100%   BMI 20.92 kg/m   Physical Exam Vitals and nursing note reviewed.  Constitutional:      General: [name] [name] is not in acute distress.    Appearance: [name] [name] is well-developed.  HENT:     Head: Normocephalic and atraumatic.  Eyes:     Conjunctiva/sclera: Conjunctivae normal.  Cardiovascular:     Rate and Rhythm: Normal rate and regular rhythm.  Heart sounds: No murmur heard. Pulmonary:     Effort: Pulmonary effort is normal. No respiratory distress.     Breath sounds: Normal breath sounds.  Abdominal:     Palpations: Abdomen is soft.     Tenderness: There is abdominal tenderness.     Comments: Epigastric tenderness to palpation without rebound or guarding.  No active vomiting.  GJ tube in place and clean.   Musculoskeletal:        General: No swelling.     Cervical back: Neck supple.  Skin:    General: Skin is warm and dry.     Capillary Refill: Capillary refill takes less than 2 seconds.  Neurological:     Mental Status: [name] [name] is alert.  Psychiatric:         Mood and Affect: Mood normal.     (all labs ordered are listed, but only abnormal results are displayed) Labs Reviewed  LIPASE, BLOOD - Abnormal; Notable for the following components:      Result Value   Lipase 535 (*)    All other components within normal limits  COMPREHENSIVE METABOLIC PANEL WITH GFR - Abnormal; Notable for the following components:   Glucose, Bld 112 (*)    BUN 5 (*)    All other components within normal limits  CBC - Abnormal; Notable for the following components:   WBC 11.1 (*)    RBC 4.18 (*)    Hemoglobin 12.0 (*)    HCT 36.5 (*)    All other components within normal limits  URINALYSIS, ROUTINE W REFLEX MICROSCOPIC - Abnormal; Notable for the following components:   Ketones, ur 80 (*)    All other components within normal limits    EKG: None  Radiology: CT ABDOMEN PELVIS W CONTRAST Result Date: 03/04/2024 EXAM: CT ABDOMEN AND PELVIS WITH CONTRAST 03/04/2024 12:59:40 AM TECHNIQUE: CT of the abdomen and pelvis was performed with the administration of intravenous contrast. Multiplanar reformatted images are provided for review. Automated exposure control, iterative reconstruction, and/or weight-based adjustment of the mA/kV was utilized to reduce the radiation dose to as low as reasonably achievable. 50mL of iohexol  (OMNIPAQUE ) 350 MG/ML injection was used. COMPARISON: None available. CLINICAL HISTORY: Pancreatitis, acute, severe. FINDINGS: LOWER CHEST: No acute abnormality. LIVER: The liver is unremarkable. GALLBLADDER AND BILE DUCTS: Layering hyperdense material within the gallbladder lumen may represent inspissated bile and/or sludge. No superimposed pericholecystic inflammatory change. PANCREAS: Extensive parenchymal calcifications are seen throughout the head and body of the pancreas in keeping with changes of chronic pancreatitis. The main pancreatic duct is dilated measuring up to 12 mm in diameter within the mid body appears centrally constricted by dense  calcification within the pancreatic head. There are extensive peripancreatic inflammatory changes within the pancreatic or duodenal groove and adjacent to the distal body of the stomach. These may relate to changes of acute on chronic pancreatitis or severe gastritis/duodenitis. A loculated fluid collection extending from the head of the pancreas demonstrates a tortuous, lobulated coarse appearance, relatively stable in size since prior examination, measuring 1.5 x 3.5 cm. Two additional poorly circumscribed hyperdense fluid collections are seen adjacent to the Gastric Antrum and first portion of the duodenum, in keeping with peri-duodenal hematoma, measuring up to 2.2 x 4.9 cm and 1.2 x 2.6 cm. STOMACH AND BOWEL: A balloon retention gastrojejunostomy is again identified, however, the jejunal limb of the catheter is now seen looping within the stomach. Extensive circumferential wall thickening and hyperemia involve the distal body of the stomach, Gastric  Antrum, and the first portion of the duodenum with obliteration of the enteric lumen in this region. PERITONEUM AND RETROPERITONEUM: Small simple-appearing free fluid is now seen within the pelvis, new since prior examination. No free intraperitoneal gas. Inflammatory changes extend from the duodenal region into the greater omentum surrounding the transverse colon. VASCULATURE: There is marked narrowing of the superior mesenteric vein and central splenic vein at the confluence with the main portal vein, however, these vessels remain patent. LYMPH NODES: No lymphadenopathy. BONES AND SOFT TISSUES: No acute bone abnormality. IMPRESSION: 1. Extensive parenchymal calcifications in the pancreatic head and body, dilated main pancreatic duct (up to 12 mm) with central constriction by dense calcification, and extensive peripancreatic inflammatory changes, consistent with acute on chronic pancreatitis. 2. Loculated fluid collection (1.5 x 3.5 cm) extending from the  pancreatic head, relatively stable in size since prior examination. 3. Two additional early circumscribed hyperdense fluid collections adjacent to the gastric antrum and first portion of the duodenum, consistent with peri-duodenal hematoma, measuring up to 2.2 x 4.9 cm and 1.2 x 2.6 cm. 4. Extensive circumferential wall thickening and hyperemia involving the distal body of the stomach, gastric antrum, and first portion of the duodenum with obliteration of the enteric lumen in this region, consistent with severe gastritis/duodenitis. 5. 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 6. 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 7. Small simple-appearing free fluid within the pelvis, new since prior examination. Electronically signed by: Dorethia Molt MD 03/04/2024 01:18 AM EDT RP Workstation: [id number]     Procedures   Medications Ordered in the ED  ondansetron  (ZOFRAN ) injection 4 mg (4 mg Intravenous Given 03/04/24 0041)  fentaNYL  (SUBLIMAZE ) injection 25 mcg (25 mcg Intravenous Given 03/04/24 0041)  iohexol  (OMNIPAQUE ) 350 MG/ML injection 50 mL (50 mLs Intravenous Contrast Given 03/04/24 0100)  lactated ringers  bolus 1,000 mL (0 mLs Intravenous Stopped 03/04/24 0612)  fentaNYL  (SUBLIMAZE ) injection 50 mcg (50 mcg Intravenous Given 03/04/24 0320)  pantoprazole  (PROTONIX ) injection 40 mg (40 mg Intravenous Given 03/04/24 0319)  oxyCODONE -acetaminophen  (PERCOCET/ROXICET) 5-325 MG per tablet 1 tablet (1 tablet Per Tube Given 03/04/24 0428)  alum & mag hydroxide-simeth (MAALOX/MYLANTA) 200-200-20 MG/5ML suspension 30 mL (30 mLs Oral Given 03/04/24 0518)    And  lidocaine  (XYLOCAINE ) 2 % viscous mouth solution 15 mL (15 mLs Oral Given 03/04/24 0518)  acetaminophen  (TYLENOL ) tablet 500 mg (500 mg Per Tube Given 03/04/24 0519)                                     Medical Decision Making Amount and/or Complexity of Data Reviewed Labs: ordered. Radiology: ordered.  Risk OTC drugs. Prescription drug management.     Differential diagnosis includes but is not limited to Cannabinoid hyperemesis syndrome, acute cholecystitis, cholelithiasis, cholangitis, choledocholithiasis, peptic ulcer, gastritis, gastroenteritis, appendicitis, IBS, IBD, DKA, nephrolithiasis, UTI, pyelonephritis, pancreatitis, diverticulitis, mesenteric ischemia, abdominal aortic aneurysm, small bowel obstruction, volvulus, testicular torsion   ED Course:  Upon initial evaluation, patient is well-appearing, no acute distress.  Stable vitals.  [name] [name] has epigastric abdominal tenderness to palpation without rebound or guarding.  No active vomiting.  Labs Ordered: I Ordered, and personally interpreted labs.  The pertinent results include:   CBC with leukocytosis of 11.1, anemia with hemoglobin 12.0 CMP unremarkable Lipase elevated  at 535 Urinalysis with ketones, no signs of infection  Imaging Studies ordered: I ordered imaging studies including CT abdomen pelvis I independently visualized the imaging with scope of interpretation limited to determining acute life threatening conditions related to emergency care. Imaging showed   IMPRESSION:  1. Extensive parenchymal calcifications in the pancreatic head and body,  dilated main pancreatic duct (up to 12 mm) with central constriction by dense  calcification, and extensive peripancreatic inflammatory changes, consistent  with acute on chronic pancreatitis.  2. Loculated fluid collection (1.5 x 3.5 cm) extending from the pancreatic  head, relatively stable in size since prior examination.  3. Two additional early circumscribed hyperdense fluid collections adjacent to  the gastric antrum and first portion of the duodenum, consistent with  peri-duodenal hematoma, measuring up to 2.2 x 4.9 cm and 1.2 x 2.6 cm.  4.  Extensive circumferential wall thickening and hyperemia involving the distal  body of the stomach, gastric antrum, and first portion of the duodenum with  obliteration of the enteric lumen in this region, consistent with severe  gastritis/duodenitis.  5. Balloon retention gastrojejunostomy with jejunal limb of the catheter now  seen looping within the stomach.  6. Marked narrowing of the superior mesenteric vein and central splenic vein at  the confluence with the main portal vein, however, these vessels remain patent.  7. Small simple-appearing free fluid within the pelvis, new since prior  examination.   I agree with the radiologist interpretation   Medications Given: Fentanyl  Percocet Tylenol  Maalox Protonix  LR bolus  Upon re-evaluation, patient remains well-appearing with stable vitals.  [name] [name] reports that the medications given have improved pain from a 8 out of 10 to about a 5 out of 10.  Nausea has improved.  [name] [name] has elevated lipase of 535, and CT scan findings consistent with acute on chronic pancreatitis.  No changes on the CT compared to previous imaging.  Gastritis also present on imaging today which was present on CT scan on 02/12/2024.  No fever, tachycardia, to suggest systemic infection.  Given improvement in pain, patient did request discharge home.  [name] [name] uses  GJ tube at home for nutrition.  Unable to formally p.o. challenge here, but has not had any episodes of emesis here.  Pain well-controlled.  [name] [name] states [name] [name] has an ERCP scheduled with Dr. Wilhelmenia in 5 days and would like to make this appointment. I feel since [name] [name] has stable vitals, no changes on CT imaging, and well controlled symptoms, [name] [name] is stable and appropriate for discharge home at this time.    Impression: Acute on chronic pancreatitis Gastritis  Disposition:  The patient was discharged home with instructions to follow-up with Dr. Wilhelmenia at his appointment in 5 days for ERCP.  May continue to take Tylenol  at home  through GJ tube for pain.  I prescribed oxycodone  for breakthrough pain to take through his GJ tube.  Protonix  daily for gastritis.  [name] [name] states [name] [name] already has Zofran  at home for nausea.  Keep well-hydrated at home. Return precautions given.    Record Review: External records from outside source obtained and reviewed including discharge summary from 02/17/2024 for admission for acute on chronic pancreatitis, [name] [name] ended up getting a JG tube placed at that time.     This chart was dictated using voice recognition software, Dragon. Despite the best efforts of this provider to proofread and correct errors, errors may still occur which can change documentation meaning.       Final diagnoses:  Acute on  chronic pancreatitis (HCC)  Other chronic gastritis without hemorrhage    ED Discharge Orders          Ordered    oxyCODONE  (ROXICODONE ) 5 MG immediate release tablet  Every 4 hours PRN        03/04/24 0617    pantoprazole  (PROTONIX ) 40 MG tablet  Daily        03/04/24 0617               Veta Palma, [name] 03/04/24 [id number]    Mesner, Selinda, MD 03/04/24 [phone]

## 2024-03-05 ENCOUNTER — Encounter: Payer: Self-pay | Admitting: Family

## 2024-03-05 DIAGNOSIS — Z934 Other artificial openings of gastrointestinal tract status: Secondary | ICD-10-CM | POA: Insufficient documentation

## 2024-03-05 NOTE — Assessment & Plan Note (Signed)
 Chronic pain managed with Tylenol  and oxycodone  prn. Methocarbamol  ineffective. Bentyl  on hold, to be discussed with GI post-ERCP. - Continue Tylenol  for pain management. - Use oxycodone  as needed for severe pain, ask GI for refill if needed after procedure. - Discuss Bentyl  use with GI post-ERCP. - Maintain elevated sleeping position to manage pain.

## 2024-03-05 NOTE — Assessment & Plan Note (Signed)
 Significant weight loss due to chronic pancreatitis. On enteral feeding with weight gain. Goal to increase weight by 15 pounds and reduce enteral feeding dependence. - Continue enteral feeding via jejunostomy tube. - Monitor weight gain and nutritional status. - Consider transitioning to oral intake as tolerated post-ERCP.

## 2024-03-05 NOTE — Assessment & Plan Note (Signed)
 Dependent on enteral feeding. Regimen involves 7 cartons over 16 hours. Aiming to reduce feeding duration as weight and oral intake improve. - Continue current enteral feeding regimen. - Aim to reduce feeding duration as weight and oral intake improve.

## 2024-03-05 NOTE — Assessment & Plan Note (Signed)
 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 Awaiting ERCP next week for potential stent placement and stone removal. May try to remove pseudocyst as well. Met w/surgeon & surgery considered if ERCP unsuccessful to remove the pancreatic head/  - Proceed with ERCP for potential stent placement or stone removal. - Discuss surgical options if ERCP is unsuccessful. - Call office if pain meds not offered after procedure. - Continue to follow

## 2024-03-08 NOTE — Anesthesia Preprocedure Evaluation (Addendum)
 Anesthesia Evaluation  Patient identified by MRN, date of birth, ID band Patient awake    Reviewed: Allergy & Precautions, NPO status , Patient's Chart, lab work & pertinent test results  History of Anesthesia Complications Negative for: history of anesthetic complications  Airway Mallampati: I  TM Distance: >3 FB Neck ROM: Full   Comment: Previous grade I view with MAC 4, easy mask Dental  (+) Dental Advisory Given   Pulmonary neg shortness of breath, asthma (has not used inhalers in years) , neg sleep apnea, neg COPD, neg recent URI   Pulmonary exam normal breath sounds clear to auscultation       Cardiovascular hypertension (metoprolol ), Pt. on home beta blockers (-) angina (-) Past MI, (-) Cardiac Stents and (-) CABG Normal cardiovascular exam(-) dysrhythmias + Valvular Problems/Murmurs (mild AS, bicuspid aortic valve s/p AVR and ascending aorta replacement)  Rhythm:Regular Rate:Normal  TTE 12/03/2022: IMPRESSIONS    1. Left ventricular ejection fraction, by estimation, is 65 to 70%. The  left ventricle has normal function. The left ventricle has no regional  wall motion abnormalities. There is mild left ventricular hypertrophy.  Left ventricular diastolic parameters  were normal.   2. Right ventricular systolic function is normal. The right ventricular  size is normal. Tricuspid regurgitation signal is inadequate for assessing  PA pressure.   3. The mitral valve is normal in structure. Trivial mitral valve  regurgitation. No evidence of mitral stenosis.   4. The aortic valve has been repaired/replaced. Aortic valve  regurgitation is trivial. There is a 23 mm On-X mechanical valve present  in the aortic position. Echo findings are consistent with normal structure  and function of the aortic valve prosthesis.   Aortic valve mean gradient measures 11.0 mmHg.   5. Aortic root/ascending aorta has been repaired/replaced.  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 The inferior vena cava is normal in size with greater than 50%  respiratory variability, suggesting right atrial pressure of 3 mmHg.   RHC 09/07/2021: 1. Normal right dominant circulation. 2. Normal cardiac output and index with with normal filling pressures with a mean PA pressure of 15 mmHg, wedge pressure of 11 mmHg, and mean right atrial pressure of 4 mmHg.      Neuro/Psych  Headaches, neg Seizures PSYCHIATRIC DISORDERS Anxiety        GI/Hepatic ,GERD  Medicated,,(+)     substance abuse (h/o)  Pancreatic duct stones Pancreatic cyst, chronic pancreatitis   Endo/Other  negative endocrine ROS    Renal/GU negative Renal ROS     Musculoskeletal   Abdominal   Peds  Hematology negative hematology ROS (+) Lab Results      Component                Value               Date                      WBC                      6.4                 04/25/2023                HGB                      12.1 (L)  04/25/2023                HCT                      36.2 (L)            04/25/2023                MCV                      91.7                04/25/2023                PLT                      382.0               04/25/2023              Anesthesia Other Findings All: amoxicillin  Reproductive/Obstetrics                              Anesthesia Physical Anesthesia Plan  ASA: 2  Anesthesia Plan: MAC   Post-op Pain Management: Minimal or no pain anticipated   Induction: Intravenous  PONV Risk Score and Plan: 1 and Ondansetron , Dexamethasone  and Treatment may vary due to age or medical condition  Airway Management Planned: Oral ETT  Additional Equipment:   Intra-op Plan:   Post-operative Plan: Extubation in OR  Informed Consent: I have reviewed the patients History and Physical, chart, labs and discussed the procedure including the risks, benefits and alternatives for the proposed  anesthesia with the patient or authorized representative who has indicated his/her understanding and acceptance.     Dental advisory given  Plan Discussed with: CRNA and Anesthesiologist  Anesthesia Plan Comments:          Anesthesia Quick Evaluation

## 2024-03-09 ENCOUNTER — Ambulatory Visit (HOSPITAL_COMMUNITY)
Admission: RE | Admit: 2024-03-09 | Discharge: 2024-03-09 | Disposition: A | Discharge status: Home / Self Care | Attending: Gastroenterology | Admitting: Gastroenterology

## 2024-03-09 ENCOUNTER — Other Ambulatory Visit: Payer: Self-pay

## 2024-03-09 ENCOUNTER — Ambulatory Visit (HOSPITAL_COMMUNITY)

## 2024-03-09 ENCOUNTER — Encounter (HOSPITAL_COMMUNITY)
Admission: RE | Disposition: A | Discharge status: Home / Self Care | Payer: Self-pay | Source: Home / Self Care | Attending: Gastroenterology

## 2024-03-09 ENCOUNTER — Encounter (HOSPITAL_COMMUNITY): Payer: Self-pay | Admitting: Gastroenterology

## 2024-03-09 ENCOUNTER — Ambulatory Visit (HOSPITAL_BASED_OUTPATIENT_CLINIC_OR_DEPARTMENT_OTHER): Admitting: Anesthesiology

## 2024-03-09 ENCOUNTER — Ambulatory Visit (HOSPITAL_COMMUNITY): Admitting: Anesthesiology

## 2024-03-09 DIAGNOSIS — K314 Gastric diverticulum: Secondary | ICD-10-CM | POA: Diagnosis not present

## 2024-03-09 DIAGNOSIS — K5710 Diverticulosis of small intestine without perforation or abscess without bleeding: Secondary | ICD-10-CM | POA: Diagnosis not present

## 2024-03-09 DIAGNOSIS — I10 Essential (primary) hypertension: Secondary | ICD-10-CM | POA: Insufficient documentation

## 2024-03-09 DIAGNOSIS — Z4659 Encounter for fitting and adjustment of other gastrointestinal appliance and device: Secondary | ICD-10-CM | POA: Diagnosis not present

## 2024-03-09 DIAGNOSIS — K8689 Other specified diseases of pancreas: Secondary | ICD-10-CM

## 2024-03-09 DIAGNOSIS — K2971 Gastritis, unspecified, with bleeding: Secondary | ICD-10-CM

## 2024-03-09 DIAGNOSIS — K5730 Diverticulosis of large intestine without perforation or abscess without bleeding: Secondary | ICD-10-CM | POA: Diagnosis not present

## 2024-03-09 DIAGNOSIS — K219 Gastro-esophageal reflux disease without esophagitis: Secondary | ICD-10-CM | POA: Insufficient documentation

## 2024-03-09 DIAGNOSIS — K2970 Gastritis, unspecified, without bleeding: Secondary | ICD-10-CM | POA: Diagnosis not present

## 2024-03-09 DIAGNOSIS — K3189 Other diseases of stomach and duodenum: Secondary | ICD-10-CM

## 2024-03-09 DIAGNOSIS — K838 Other specified diseases of biliary tract: Secondary | ICD-10-CM | POA: Diagnosis not present

## 2024-03-09 DIAGNOSIS — K861 Other chronic pancreatitis: Secondary | ICD-10-CM

## 2024-03-09 DIAGNOSIS — J45909 Unspecified asthma, uncomplicated: Secondary | ICD-10-CM | POA: Insufficient documentation

## 2024-03-09 DIAGNOSIS — K315 Obstruction of duodenum: Secondary | ICD-10-CM

## 2024-03-09 DIAGNOSIS — Z931 Gastrostomy status: Secondary | ICD-10-CM

## 2024-03-09 HISTORY — PX: ESOPHAGOGASTRODUODENOSCOPY: SHX5428

## 2024-03-09 HISTORY — PX: ERCP: SHX5425

## 2024-03-09 SURGERY — ERCP, WITH INTERVENTION IF INDICATED
Anesthesia: Monitor Anesthesia Care

## 2024-03-09 MED ORDER — SODIUM CHLORIDE 0.9 % IV SOLN: INTRAVENOUS | Status: DC

## 2024-03-09 MED ORDER — FENTANYL CITRATE (PF) 100 MCG/2ML IJ SOLN
INTRAMUSCULAR | Status: AC
  Filled 2024-03-09: qty 2

## 2024-03-09 MED ORDER — DEXMEDETOMIDINE HCL IN NACL 80 MCG/20ML IV SOLN
INTRAVENOUS | Status: DC | PRN
  Administered 2024-03-09: 8 ug via INTRAVENOUS
  Administered 2024-03-09 (×2): 4 ug via INTRAVENOUS

## 2024-03-09 MED ORDER — SODIUM CHLORIDE 0.9 % IV SOLN
INTRAVENOUS | Status: DC | PRN
  Administered 2024-03-09: 10 mL

## 2024-03-09 MED ORDER — SUGAMMADEX SODIUM 200 MG/2ML IV SOLN
INTRAVENOUS | Status: DC | PRN
  Administered 2024-03-09: 140 mg via INTRAVENOUS

## 2024-03-09 MED ORDER — ONDANSETRON HCL 4 MG/2ML IJ SOLN
INTRAMUSCULAR | Status: DC | PRN
  Administered 2024-03-09: 4 mg via INTRAVENOUS

## 2024-03-09 MED ORDER — GLUCAGON HCL RDNA (DIAGNOSTIC) 1 MG IJ SOLR
INTRAMUSCULAR | Status: AC
  Filled 2024-03-09: qty 1

## 2024-03-09 MED ORDER — PANTOPRAZOLE SODIUM 40 MG PO TBEC: 40.0000 mg | DELAYED_RELEASE_TABLET | Freq: Two times a day (BID) | ORAL | 0 refills | Status: AC

## 2024-03-09 MED ORDER — DICLOFENAC SUPPOSITORY 100 MG
RECTAL | Status: AC
  Filled 2024-03-09: qty 1

## 2024-03-09 MED ORDER — CIPROFLOXACIN IN D5W 400 MG/200ML IV SOLN
INTRAVENOUS | Status: AC
Start: 2024-03-09 — End: 2024-03-09
  Filled 2024-03-09: qty 200

## 2024-03-09 MED ORDER — LIDOCAINE HCL (CARDIAC) PF 100 MG/5ML IV SOSY
PREFILLED_SYRINGE | INTRAVENOUS | Status: DC | PRN
  Administered 2024-03-09: 80 mg via INTRAVENOUS

## 2024-03-09 MED ORDER — FENTANYL CITRATE (PF) 100 MCG/2ML IJ SOLN
INTRAMUSCULAR | Status: DC | PRN
  Administered 2024-03-09 (×2): 50 ug via INTRAVENOUS

## 2024-03-09 MED ORDER — PROPOFOL 10 MG/ML IV BOLUS
INTRAVENOUS | Status: AC
  Filled 2024-03-09: qty 20

## 2024-03-09 MED ORDER — OXYCODONE HCL 10 MG PO TABS: 5.0000 mg | ORAL_TABLET | ORAL | 0 refills | Status: AC | PRN

## 2024-03-09 MED ORDER — LACTATED RINGERS IV SOLN
INTRAVENOUS | Status: AC | PRN
  Administered 2024-03-09: 1000 mL via INTRAVENOUS

## 2024-03-09 MED ORDER — GLUCAGON HCL RDNA (DIAGNOSTIC) 1 MG IJ SOLR
INTRAMUSCULAR | Status: DC | PRN
  Administered 2024-03-09 (×3): .25 mg via INTRAVENOUS

## 2024-03-09 MED ORDER — PROPOFOL 10 MG/ML IV BOLUS
INTRAVENOUS | Status: DC | PRN
  Administered 2024-03-09: 150 mg via INTRAVENOUS

## 2024-03-09 MED ORDER — DEXAMETHASONE SODIUM PHOSPHATE 10 MG/ML IJ SOLN
INTRAMUSCULAR | Status: DC | PRN
  Administered 2024-03-09: 5 mg via INTRAVENOUS

## 2024-03-09 MED ORDER — ROCURONIUM BROMIDE 100 MG/10ML IV SOLN
INTRAVENOUS | Status: DC | PRN
  Administered 2024-03-09: 40 mg via INTRAVENOUS

## 2024-03-09 MED ORDER — DICLOFENAC SUPPOSITORY 100 MG
RECTAL | Status: DC | PRN
Start: 2024-03-09 — End: 2024-03-09
  Administered 2024-03-09: 100 mg via RECTAL

## 2024-03-09 MED ORDER — CIPROFLOXACIN IN D5W 400 MG/200ML IV SOLN
INTRAVENOUS | Status: DC | PRN
Start: 2024-03-09 — End: 2024-03-09
  Administered 2024-03-09: 400 mg via INTRAVENOUS

## 2024-03-09 NOTE — H&P (Signed)
 GASTROENTEROLOGY PROCEDURE H&P NOTE   Primary Care Physician: Lucius Krabbe, NP  HPI: [name] [name] is a 33 y.o. male who presents for ERCP for evaluation of Chronic pancreatitis and pancreatic ductal stones.  Past Medical History:  Diagnosis Date   Abnormal finding on GI tract imaging 12/07/2023   Aortic valve calcification 06/08/2020   Asthma    Headache    migraines   Heart murmur    Hypokalemia 06/08/2020   Leukocytosis 01/23/2022   Mild aortic stenosis    Pancreatitis    Pneumonia    as a child x several   Substance abuse (HCC)    Subtherapeutic international normalized ratio (INR) 01/23/2022   Transaminitis 06/08/2020   Past Surgical History:  Procedure Laterality Date   AORTIC VALVE REPLACEMENT N/A 09/27/2021   Procedure: AORTIC VALVE REPLACEMENT (AVR) USING ON-X AORTIC VALVE SIZE ;  Surgeon: Kerrin Elspeth BROCKS, MD;  Location: [facility];  Service: Open Heart Surgery;  Laterality: N/A;   BIOPSY  08/01/2023   Procedure: BIOPSY;  Surgeon: Wilhelmenia Aloha Raddle., MD;  Location: [facility];  Service: Gastroenterology;;   ESOPHAGOGASTRODUODENOSCOPY N/A 08/01/2023   Procedure: ESOPHAGOGASTRODUODENOSCOPY (EGD);  Surgeon: Wilhelmenia Aloha Raddle., MD;  Location: [facility];  Service: Gastroenterology;  Laterality: N/A;   EUS N/A 08/01/2023   Procedure: UPPER ENDOSCOPIC ULTRASOUND (EUS) RADIAL;  Surgeon: Wilhelmenia Aloha Raddle., MD;  Location: [facility];  Service: Gastroenterology;  Laterality: N/A;   FINE NEEDLE ASPIRATION N/A 08/01/2023   Procedure: FINE NEEDLE ASPIRATION (FNA) LINEAR;  Surgeon: Wilhelmenia Aloha Raddle., MD;  Location: [facility];  Service: Gastroenterology;  Laterality: N/A;   INGUINAL HERNIA REPAIR Right 01/05/2022   Procedure: OPEN REPAIR INCARCERATED RIGHT INGUINAL HERNIA WITH MESH;  Surgeon: Tanda Locus, MD;  Location: [facility];  Service: General;  Laterality: Right;  90 MIN - ROOM 2   INGUINAL HERNIA REPAIR Left 12/20/2022    Procedure: OPEN REPAIR LEFT INGUINAL HERNIA WITH MESH;  Surgeon: Tanda Locus, MD;  Location: [facility];  Service: General;  Laterality: Left;   IR GASTR TUBE CONVERT GASTR-JEJ PER W/FL MOD SED  02/13/2024   IR GASTROSTOMY TUBE MOD SED  02/13/2024   REPLACEMENT ASCENDING AORTA N/A 09/27/2021   Procedure: REPLACEMENT ASCENDING AND INNOMINATE ANEURYSM USING HEMASHIELD PLATINUM [id number];  Surgeon: Kerrin Elspeth BROCKS, MD;  Location: [facility] OR;  Service: Open Heart Surgery;  Laterality: N/A;   RIGHT HEART CATH AND CORONARY ANGIOGRAPHY N/A 09/07/2021   Procedure: RIGHT HEART CATH AND CORONARY ANGIOGRAPHY;  Surgeon: Wendel Lurena POUR, MD;  Location: [facility];  Service: Cardiovascular;  Laterality: N/A;   TEE WITHOUT CARDIOVERSION N/A 09/27/2021   Procedure: TRANSESOPHAGEAL ECHOCARDIOGRAM (TEE);  Surgeon: Kerrin Elspeth BROCKS, MD;  Location: [facility] OR;  Service: Open Heart Surgery;  Laterality: N/A;   Current Facility-Administered Medications  Medication Dose Route Frequency Provider Last Rate Last Admin   0.9 %  sodium chloride  infusion   Intravenous Continuous Mansouraty, Tyson Parkison Jr., MD       lactated ringers  infusion    Continuous PRN Mansouraty, Tatyanna Cronk Jr., MD 20 mL/hr at 03/09/24 1318 1,000 mL at 03/09/24 1318    Current Facility-Administered Medications:    0.9 %  sodium chloride  infusion, , Intravenous, Continuous, Mansouraty, Aloha Raddle., MD   lactated ringers  infusion, , , Continuous PRN, Mansouraty, Aloha Raddle., MD, Last Rate: 20 mL/hr at 03/09/24 1318, 1,000 mL at 03/09/24 1318 Allergies  Allergen Reactions   Amoxicillin Hives   Family History  Problem Relation Age of Onset  Pancreatitis Maternal Uncle    Colon cancer Neg Hx    Esophageal cancer Neg Hx    Social History   Socioeconomic History   Marital status: Single    Spouse name: Not on file   Number of children: 0   Years of education: Not on file   Highest education level: Bachelor's degree (e.g., BA, AB, BS)   Occupational History   Occupation: Bartender    Comment: [employer]  Tobacco Use   Smoking status: Never   Smokeless tobacco: Never  Vaping Use   Vaping status: Never Used  Substance and Sexual Activity   Alcohol use: Not Currently    Comment: none x 1 month as od 04/11/2023   Drug use: Not Currently    Types: Other-see comments    Comment: Edibles occasionally marijuana   Sexual activity: Yes  Other Topics Concern   Not on file  Social History Narrative   Not on file   Social Drivers of Health   Financial Resource Strain: Low Risk  (04/25/2023)   Overall Financial Resource Strain (CARDIA)    Difficulty of Paying Living Expenses: Not very hard  Food Insecurity: No Food Insecurity (01/11/2024)   Hunger Vital Sign    Worried About Running Out of Food in the Last Year: Never true    Ran Out of Food in the Last Year: Never true  Transportation Needs: No Transportation Needs (01/11/2024)   PRAPARE - Administrator, Civil Service (Medical): No    Lack of Transportation (Non-Medical): No  Physical Activity: Sufficiently Active (04/25/2023)   Exercise Vital Sign    Days of Exercise per Week: 5 days    Minutes of Exercise per Session: 150+ min  Stress: No Stress Concern Present (04/25/2023)   [employer] of Occupational Health - Occupational Stress Questionnaire    Feeling of Stress : Only a little  Social Connections: Unknown (04/25/2023)   Social Connection and Isolation Panel    Frequency of Communication with Friends and Family: Three times a week    Frequency of Social Gatherings with Friends and Family: Once a week    Attends Religious Services: Patient declined    Database administrator or Organizations: No    Attends Engineer, structural: Not on file    Marital Status: Never married  Intimate Partner Violence: Not At Risk (01/11/2024)   Humiliation, Afraid, Rape, and Kick questionnaire    Fear of Current or Ex-Partner: No     Emotionally Abused: No    Physically Abused: No    Sexually Abused: No    Physical Exam: Today's Vitals   03/09/24 1315  BP: (!) 147/104  Resp: 15  Temp: (!) 97.3 F (36.3 C)  TempSrc: Temporal  SpO2: 99%  Weight: 65.8 kg  Height: 5' 11 (1.803 m)  PainSc: 5    Body mass index is 20.22 kg/m. GEN: NAD EYE: Sclerae anicteric ENT: MMM CV: Non-tachycardic GI: Soft, GJ in place, 5-6/10 pain (preprocedure) NEURO:  Alert & Oriented x 3  Lab Results: No results for input(s): WBC, HGB, HCT, PLT in the last 72 hours. BMET No results for input(s): NA, K, CL, CO2, GLUCOSE, BUN, CREATININE, CALCIUM  in the last 72 hours. LFT No results for input(s): PROT, ALBUMIN , AST, ALT, ALKPHOS, BILITOT, BILIDIR, IBILI in the last 72 hours. PT/INR No results for input(s): LABPROT, INR in the last 72 hours.   Impression / Plan: This is a 33 y.o.male who presents for ERCP  for evaluation of Chronic pancreatitis and pancreatic ductal stones.  The risks of an ERCP were discussed at length, including but not limited to the risk of perforation, bleeding, abdominal pain, post-ERCP pancreatitis (while usually mild can be severe and even life threatening).   The risks and benefits of endoscopic evaluation/treatment were discussed with the patient and/or family; these include but are not limited to the risk of perforation, infection, bleeding, missed lesions, lack of diagnosis, severe illness requiring hospitalization, as well as anesthesia and sedation related illnesses.  The patient's history has been reviewed, patient examined, no change in status, and deemed stable for procedure.  The patient and/or family is agreeable to proceed.    Aloha Finner, MD [facility] Office # [phone]

## 2024-03-09 NOTE — Anesthesia Procedure Notes (Signed)
 Procedure Name: Intubation Date/Time: 03/09/2024 2:11 PM  Performed by: Gladis Honey, CRNAPre-anesthesia Checklist: Patient identified, Emergency Drugs available, Suction available and Patient being monitored Patient Re-evaluated:Patient Re-evaluated prior to induction Oxygen Delivery Method: Circle System Utilized Preoxygenation: Pre-oxygenation with 100% oxygen Induction Type: IV induction Ventilation: Mask ventilation without difficulty Laryngoscope Size: Miller and 2 Grade View: Grade I Tube type: Oral Tube size: 7.5 mm Number of attempts: 1 Airway Equipment and Method: Stylet and Oral airway Placement Confirmation: ETT inserted through vocal cords under direct vision, positive ETCO2 and breath sounds checked- equal and bilateral Secured at: 23 cm Tube secured with: Tape Dental Injury: Teeth and Oropharynx as per pre-operative assessment

## 2024-03-09 NOTE — Transfer of Care (Signed)
 Immediate Anesthesia Transfer of Care Note  Patient: [name] [name]  Procedure(s) Performed: ERCP, WITH INTERVENTION IF INDICATED EGD (ESOPHAGOGASTRODUODENOSCOPY)  Patient Location: PACU  Anesthesia Type:General  Level of Consciousness: awake, alert , and oriented  Airway & Oxygen Therapy: Patient Spontanous Breathing  Post-op Assessment: Report given to RN and Post -op Vital signs reviewed and stable  Post vital signs: Reviewed and stable  Last Vitals:  Vitals Value Taken Time  BP 130/89 03/09/24 15:20  Temp    Pulse 88 03/09/24 15:21  Resp 11 03/09/24 15:21  SpO2 100 % 03/09/24 15:21  Vitals shown include unfiled device data.  Last Pain:  Vitals:   03/09/24 1315  TempSrc: Temporal  PainSc: 5       Patients Stated Pain Goal: 5 (03/09/24 1315)  Complications: No notable events documented.

## 2024-03-09 NOTE — Discharge Instructions (Signed)

## 2024-03-09 NOTE — Op Note (Signed)
 [facility] Patient Name: [name] [name] Procedure Date: 03/09/2024 MRN: [id number] Attending MD: Aloha Finner , MD, [id number] Date of Birth: 11/10/1990 CSN: [id number] Age: 33 Admit Type: Outpatient Procedure:                ERCP Indications:              Chronic pancreatitis on Computed Tomogram Scan,                            Pancreatic duct dilatation on magnetic resonance                            cholangiopancreatography, Pancreatic duct stone,                            Chronic recurrent pancreatitis, For therapy of                            chronic recurrent pancreatitis Providers:                Aloha Finner, MD, Olam Riedel, RN, [facility]                            [place], Technician, Carliss Martin,CRNA Referring MD:             Gordy HERO. Pyrtle, MD Medicines:                General Anesthesia, Cipro  400 mg IV, Diclofenac 100                            mg rectal, Glucagon  0.75 mg IV Complications:            No immediate complications. Estimated Blood Loss:     Estimated blood loss was minimal. Procedure:                Pre-Anesthesia Assessment:                           - Prior to the procedure, a History and Physical                            was performed, and patient medications and                            allergies were reviewed. The patient's tolerance of                            previous anesthesia was also reviewed. The risks                            and benefits of the procedure and the sedation                            options and risks were discussed with the patient.  All questions were answered, and informed consent                            was obtained. Prior Anticoagulants: The patient has                            taken Coumadin  (warfarin), last dose was 5 days                            prior to procedure. ASA Grade Assessment: III - A                            patient with severe systemic  disease. After                            reviewing the risks and benefits, the patient was                            deemed in satisfactory condition to undergo the                            procedure.                           After obtaining informed consent, the scope was                            passed under direct vision. Throughout the                            procedure, the patient's blood pressure, pulse, and                            oxygen saturations were monitored continuously. The                            [employer] D single use                            duodenoscope was introduced through the mouth, and                            used to inject contrast into and used to inject                            contrast into the bile duct and ventral pancreatic                            duct. The GIF-H190 ([id number]) Olympus endoscope was                            introduced through the mouth, and used to inject  contrast into and used to locate the major papilla.                            The ERCP was technically difficult and complex due                            to duodenal stenosis. Successful completion of the                            procedure was aided by changing the patient's                            position, withdrawing and reinserting the scope,                            straightening and shortening the scope to obtain                            bowel loop reduction and using scope torsion. The                            patient tolerated the procedure. Scope In: Scope Out: Findings:      The scout film was normal.      The upper GI tract was traversed under direct vision without detailed       examination. No gross lesions were noted in the entire esophagus. A       medium diverticulum was found in the gastric fundus. Segmental moderate       inflammation characterized by erosions, erythema and granularity  was       found in the entire examined stomach. There was evidence of an intact       gastrostomy with a patent G-tube present on the anterior wall of the       stomach however the J-tube portion was completely within the stomach       (see recent CT scan from ED visit 5 days ago). An acquired       benign-appearing, significant amount of congestion and swelling with       developing intrinsic moderate stenosis was found in the duodenal bulb       duodenal sweep and was traversed with fluoroscopic monitoring by the       duodenoscope and by endoscopic evaluation with the adult endoscope. The       major papilla was on the rim of a diverticulum. The major papilla was       edematous and congested and floppy.      The ventral pancreatic duct could not be cannulated with the short-nosed       traction sphincterotome using both a Hydratome sphincterotome and a       revolution Jagtome with wire guidance. We then attempted superficial       cannulation of and small aliquots of contrast injections which showed a       small portion of the distal pancreas duct and the biliary tree with the       revolution jack sphincterotome. I personally interpreted the bile duct       and pancreatic duct images. There was appropriate flow of contrast  through the ducts. Image quality was suboptimal. Contrast extended to       the hepatic ducts. Contrast extended to the distal pancreatic duct.       Segmental irregularity of the pancreatic duct was seen in the ventral       pancreatic duct in the head of the pancreas was seen however I could not       get the pancreas duct within the neck or body to fill (presumptively       from stenosis and from the significant pancreatic stone burden noted on       previous imaging and EUS) even after trialing straight wires and angled       wires. The contrast that was within the biliary system did not suggest       significant biliary dilation. The largest diameter was  3-4 mm. After       nearly an hour of attempting cannulation more deeply into the pancreatic       duct, we could not access it today.      The duodenoscope was withdrawn from the patient.      I replaced the duodenoscope with the adult endoscope. Using an alligator       forcep, we were able to gently push the jejunal feeding tube portion       into the duodenum to/duodenum 3 and under fluoroscopy were able to see       that this was in decent positioning. Impression:               - No gross lesions in the entire esophagus.                           - Gastric diverticulum within the fundus.                           - Gastritis. Biopsied.                           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 Jejunal extension coiled inside                            the stomach.                           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                           - The major papilla was on the rim of a                            diverticulum. The major papilla  appeared edematous                            and floppy.                           - Only superficial cannulation of the distal bile                            duct and pancreatic duct were achieved, as a result                            of the significant inflammation that is present and                            likely ongoing pancreatitis as evidenced on recent                            CT scan from 9/24.                           - An irregularity was found in the ventral                            pancreatic duct in the head of the pancreas. 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 I could not get the pancreatic wire to go                            distally using  multiple wires as described above.                           - Not able to obtain a stable position to consider                            small pancreatic sphincterotomy.                           - Jejunal feeding tube repositioned post D2/D3 at                            completion of procedure. Moderate Sedation:      Not Applicable - Patient had care per Anesthesia. Recommendation:           - The patient will be observed post-procedure,                            until all discharge criteria are met.                           -  Goal is to discharge patient home today. However,                            patient recent imaging 5 days ago suggested ongoing                            active pancreatitis. He describes with his tube                            feeds that he is having persisting pain until tube                            feeds and. This is likely a result of the J portion                            of his GJ being in his stomach and the significant                            intrinsic stenosis that is occurring within the                            duodenum likely causing him to have progressive                            pain. Ideally patient would have                            postpyloric/jejunal feeding. Hopefully with me                            having replaced the J portion distal to the second                            portion of the duodenum, his tube feeds will more                            adequately allow patient to have less pain and                            support healing.                           - Pending his ability/pain post procedure we will                            decide whether observation may be required after                            today's interventions.                           - Patient has a contact number available for  emergencies. The signs and symptoms of potential                            delayed  complications were discussed with the                            patient. Return to normal activities tomorrow.                            Written discharge instructions were provided to the                            patient.                           - Observe patient's clinical course.                           - Watch for pancreatitis, bleeding, perforation,                            and cholangitis.                           - I think it is reasonable, with the hope that with                            his feeding tube being post jejunal, that within a                            few weeks he may have less risk of ongoing active                            pancreatitis. At that point it would be reasonable                            to consider another attempt at ERCP or if he would                            like an attempt at ERCP at one of the quaternary                            centers. His anatomy is such that [place] surgical                            interventions could be considered in future with a                            significant dilation of his pancreas duct.                           - The findings and recommendations were discussed  with the patient.                           - The findings and recommendations were discussed                            with the patient's family. Procedure Code(s):        --- Professional ---                           [phone], Endoscopic retrograde                            cholangiopancreatography (ERCP); diagnostic,                            including collection of specimen(s) by brushing or                            washing, when performed (separate procedure)                           74330, 26, Combined endoscopic catheterization of                            the biliary and pancreatic ductal systems,                            radiological supervision and interpretation Diagnosis Code(s):         --- Professional ---                           K31.4, Gastric diverticulum                           K29.70, Gastritis, unspecified, without bleeding                           Z93.1, Gastrostomy status                           K31.5, Obstruction of duodenum                           K83.8, Other specified diseases of biliary tract                           K86.1, Other chronic pancreatitis                           K86.89, Other specified diseases of pancreas                           R93.2, Abnormal findings on diagnostic imaging of                            liver and biliary tract CPT copyright 2022 American Medical Association. All rights reserved.  The codes documented in this report are preliminary and upon coder review may  be revised to meet current compliance requirements. Aloha Finner, MD 03/09/2024 3:38:06 PM Number of Addenda: 0

## 2024-03-09 NOTE — Anesthesia Postprocedure Evaluation (Signed)
 Anesthesia Post Note  Patient: [name] [name]  Procedure(s) Performed: ERCP, WITH INTERVENTION IF INDICATED EGD (ESOPHAGOGASTRODUODENOSCOPY)     Patient location during evaluation: Endoscopy Anesthesia Type: General Level of consciousness: awake Pain management: pain level controlled Vital Signs Assessment: post-procedure vital signs reviewed and stable Respiratory status: spontaneous breathing Postop Assessment: no apparent nausea or vomiting Anesthetic complications: no   No notable events documented.  Last Vitals:  Vitals:   03/09/24 1530 03/09/24 1535  BP: 119/84   Pulse: 84 86  Resp: 11 (!) 9  Temp:    SpO2: 98% 96%    Last Pain:  Vitals:   03/09/24 1535  TempSrc:   PainSc: 0-No pain                 Lauraine KATHEE [place]

## 2024-03-10 ENCOUNTER — Telehealth (HOSPITAL_COMMUNITY): Payer: Self-pay

## 2024-03-10 ENCOUNTER — Encounter (HOSPITAL_COMMUNITY): Payer: Self-pay | Admitting: Gastroenterology

## 2024-03-10 NOTE — Telephone Encounter (Signed)
 Thank you Ozell for letting me know. Agree with your outlined plan. If things worsen, he knows he should return to the hospital.  Patty, Please check in on patient tomorrow. Also needs a clinic followup with Pyrtle or APP to see how doing with his feeds and smoldering pancreatitis.SABRA He and Aunt were going to let me know if he wanted me to retry ERCP in 6-8 weeks or be sent to [facility]. Also they were going to let us  know if they wanted Surgical referral for [place] consideration. Thanks. GM  FYI JMP

## 2024-03-10 NOTE — Telephone Encounter (Signed)
 Post op call completed with pt following up on ERCP on 03/09/24. Pt reports 5/10 abdominal pain, states that it's currently a manageable level. Pt also reports that during his ERCP yesterday, his jejunal feeding tube was moved out of position and subsequently repositioned by MD Mansouraty, wanted to know what to look out for in regards to malpositioning.   MD Mansouraty notified of the above in person. States that pt should seek care if pain worsens or if there is a worsening of abdominal pain with tube feeds. Stated that the GI office would schedule an x-ray to ensure placement of J tube.  Educated pt on MD Mansouraty's instructions as detailed above. Patient demonstrated understanding and had no further questions at the end of the call.  Ozell VEAR Pouch, RN 03/10/24 12:43 PM

## 2024-03-11 ENCOUNTER — Ambulatory Visit: Payer: Self-pay | Admitting: Gastroenterology

## 2024-03-11 LAB — SURGICAL PATHOLOGY

## 2024-03-11 NOTE — Telephone Encounter (Signed)
 Patty, Thank you for letting me know that he is doing better. Please move forward with referral to Dr. Allen/Dr. Aron for consideration of Pe[place]ow procedure in the setting of chronic pancreatitis and dilated pancreas duct. Please schedule him for repeat ERCP attempt with me in 6 to 8 weeks for attempt at pancreatic ERCP and pancreatic ductal stenting. Thanks. GM  FYI JMP on your patient

## 2024-03-11 NOTE — Telephone Encounter (Signed)
 Left message on machine to call back

## 2024-03-11 NOTE — Telephone Encounter (Signed)
 The pt states he is doing much better and states he would like to have ERCP on 6-8 weeks as discussed with Dr Wilhelmenia.    He is also interested in surgical referral  10/15 930 am appt made to see Dr Albertus

## 2024-03-12 ENCOUNTER — Other Ambulatory Visit: Payer: Self-pay

## 2024-03-12 DIAGNOSIS — K861 Other chronic pancreatitis: Secondary | ICD-10-CM

## 2024-03-12 NOTE — Telephone Encounter (Signed)
ERCP scheduled, pt instructed and medications reviewed.  Patient instructions mailed to home.  Patient to call with any questions or concerns.  

## 2024-03-12 NOTE — Telephone Encounter (Signed)
 Referral has been made to [facility] and records faxed  ERCP has been set up for 05/04/24 at 8 am at [facility] with GM  Left message on machine to call back

## 2024-03-18 ENCOUNTER — Other Ambulatory Visit (HOSPITAL_COMMUNITY): Payer: Self-pay | Admitting: Interventional Radiology

## 2024-03-18 DIAGNOSIS — R1115 Cyclical vomiting syndrome unrelated to migraine: Secondary | ICD-10-CM

## 2024-03-19 ENCOUNTER — Ambulatory Visit (HOSPITAL_COMMUNITY)
Admission: RE | Admit: 2024-03-19 | Discharge: 2024-03-19 | Disposition: A | Discharge status: Home / Self Care | Source: Ambulatory Visit | Attending: Interventional Radiology | Admitting: Interventional Radiology

## 2024-03-19 DIAGNOSIS — K9423 Gastrostomy malfunction: Secondary | ICD-10-CM | POA: Diagnosis present

## 2024-03-19 DIAGNOSIS — R1115 Cyclical vomiting syndrome unrelated to migraine: Secondary | ICD-10-CM | POA: Insufficient documentation

## 2024-03-19 HISTORY — PX: IR GJ TUBE CHANGE: IMG1440

## 2024-03-19 MED ORDER — IOHEXOL 300 MG/ML  SOLN
50.0000 mL | Freq: Once | INTRAMUSCULAR | Status: AC | PRN
  Administered 2024-03-19: 25 mL

## 2024-03-19 MED ORDER — LIDOCAINE VISCOUS HCL 2 % MT SOLN
OROMUCOSAL | Status: AC
  Filled 2024-03-19: qty 15

## 2024-03-25 ENCOUNTER — Other Ambulatory Visit (INDEPENDENT_AMBULATORY_CARE_PROVIDER_SITE_OTHER)

## 2024-03-25 ENCOUNTER — Encounter: Payer: Self-pay | Admitting: Internal Medicine

## 2024-03-25 ENCOUNTER — Telehealth: Payer: Self-pay

## 2024-03-25 ENCOUNTER — Ambulatory Visit (INDEPENDENT_AMBULATORY_CARE_PROVIDER_SITE_OTHER): Admitting: Internal Medicine

## 2024-03-25 VITALS — BP 110/64 | HR 74 | Ht 71.0 in | Wt 140.0 lb

## 2024-03-25 DIAGNOSIS — K861 Other chronic pancreatitis: Secondary | ICD-10-CM

## 2024-03-25 DIAGNOSIS — E43 Unspecified severe protein-calorie malnutrition: Secondary | ICD-10-CM | POA: Diagnosis not present

## 2024-03-25 DIAGNOSIS — K859 Acute pancreatitis without necrosis or infection, unspecified: Secondary | ICD-10-CM | POA: Diagnosis not present

## 2024-03-25 DIAGNOSIS — R1010 Upper abdominal pain, unspecified: Secondary | ICD-10-CM | POA: Diagnosis not present

## 2024-03-25 DIAGNOSIS — K863 Pseudocyst of pancreas: Secondary | ICD-10-CM | POA: Diagnosis not present

## 2024-03-25 DIAGNOSIS — Z931 Gastrostomy status: Secondary | ICD-10-CM

## 2024-03-25 LAB — CBC WITH DIFFERENTIAL/PLATELET
Basophils Absolute: 0 K/uL (ref 0.0–0.1)
Basophils Relative: 0.3 % (ref 0.0–3.0)
Eosinophils Absolute: 0.3 K/uL (ref 0.0–0.7)
Eosinophils Relative: 6.4 % — ABNORMAL HIGH (ref 0.0–5.0)
HCT: 36.7 % — ABNORMAL LOW (ref 39.0–52.0)
Hemoglobin: 11.9 g/dL — ABNORMAL LOW (ref 13.0–17.0)
Lymphocytes Relative: 32.5 % (ref 12.0–46.0)
Lymphs Abs: 1.6 K/uL (ref 0.7–4.0)
MCHC: 32.4 g/dL (ref 30.0–36.0)
MCV: 84.8 fl (ref 78.0–100.0)
Monocytes Absolute: 0.4 K/uL (ref 0.1–1.0)
Monocytes Relative: 8.2 % (ref 3.0–12.0)
Neutro Abs: 2.6 K/uL (ref 1.4–7.7)
Neutrophils Relative %: 52.6 % (ref 43.0–77.0)
Platelets: 445 K/uL — ABNORMAL HIGH (ref 150.0–400.0)
RBC: 4.33 Mil/uL (ref 4.22–5.81)
RDW: 15 % (ref 11.5–15.5)
WBC: 5 K/uL (ref 4.0–10.5)

## 2024-03-25 LAB — COMPREHENSIVE METABOLIC PANEL WITH GFR
ALT: 174 U/L — ABNORMAL HIGH (ref 0–53)
AST: 115 U/L — ABNORMAL HIGH (ref 0–37)
Albumin: 4.4 g/dL (ref 3.5–5.2)
Alkaline Phosphatase: 1300 U/L — ABNORMAL HIGH (ref 39–117)
BUN: 9 mg/dL (ref 6–23)
CO2: 30 meq/L (ref 19–32)
Calcium: 9.8 mg/dL (ref 8.4–10.5)
Chloride: 95 meq/L — ABNORMAL LOW (ref 96–112)
Creatinine, Ser: 0.72 mg/dL (ref 0.40–1.50)
GFR: 120.31 mL/min (ref >60.00)
Glucose, Bld: 110 mg/dL — ABNORMAL HIGH (ref 70–99)
Potassium: 3.9 meq/L (ref 3.5–5.1)
Sodium: 136 meq/L (ref 135–145)
Total Bilirubin: 1.9 mg/dL — ABNORMAL HIGH (ref 0.2–1.2)
Total Protein: 8.3 g/dL (ref 6.0–8.3)

## 2024-03-25 LAB — PROTIME-INR
INR: 10 ratio (ref 0.8–1.0)
Prothrombin Time: 120 s — ABNORMAL HIGH (ref 9.6–13.1)

## 2024-03-25 LAB — LIPASE: Lipase: 43 U/L (ref 11.0–59.0)

## 2024-03-25 MED ORDER — OXYCODONE HCL 10 MG PO TABS: 10.0000 mg | ORAL_TABLET | ORAL | 0 refills | Status: DC | PRN

## 2024-03-25 NOTE — Telephone Encounter (Signed)
 Received critical lab on pt. Value sent to Dr Albertus.  >10 INR >120 PT

## 2024-03-25 NOTE — Patient Instructions (Addendum)
 Your provider has requested that you go to the basement level for lab work before leaving today. Press B on the elevator. The lab is located at the first door on the left as you exit the elevator.  Continue to not have food by mouth.  We have sent the following medications to your pharmacy for you to pick up at your convenience: Oxycodone .   Vent your G tube portion and gravity bag feed J tube at home.   If you cannot tolerate tube feedings then you do need to go to the hospital to be admitted.   _______________________________________________________  If your blood pressure at your visit was 140/90 or greater, please contact your primary care physician to follow up on this.  _______________________________________________________  If you are age 33 or older, your body mass index should be between 23-30. Your Body mass index is 19.53 kg/m. If this is out of the aforementioned range listed, please consider follow up with your Primary Care Provider.  If you are age 33 or younger, your body mass index should be between 19-25. Your Body mass index is 19.53 kg/m. If this is out of the aformentioned range listed, please consider follow up with your Primary Care Provider.   ________________________________________________________  The [facility] providers would like to encourage you to use MYCHART to communicate with providers for non-urgent requests or questions.  Due to long hold times on the telephone, sending your provider a message by [facility] may be a faster and more efficient way to get a response.  Please allow 48 business hours for a response.  Please remember that this is for non-urgent requests.  _______________________________________________________  [facility] is using a team-based approach to care.  Your team is made up of your doctor and two to three APPS. Our APPS (Nurse Practitioners and Physician Assistants) work with your physician to ensure care continuity for  you. They are fully qualified to address your health concerns and develop a treatment plan. They communicate directly with your gastroenterologist to care for you. Seeing the Advanced Practice Practitioners on your physician's team can help you by facilitating care more promptly, often allowing for earlier appointments, access to diagnostic testing, procedures, and other specialty referrals.

## 2024-03-25 NOTE — Progress Notes (Signed)
 Subjective:    Patient ID: [name] [name], male    DOB: [birth year], 33 y.o.   MRN: [id number]  HPI [name] [name] is a 33 year old male with chronic pancreatitis who presents for follow-up.  He has chronic pancreatitis complicated by a pseudocyst, partial gastric outlet obstruction, and malnutrition. He has experienced significant weight loss and has been unable to eat since Friday, with the last tube feeding attempted on that day. Severe pain occurred on Saturday after attempting tube feeding and consuming broth, leaving him bedridden. Despite drinking water , he feels dehydration.  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 The procedure was limited due to significant inflammation from ongoing pancreatitis, allowing only superficial cannulation of the distal bile duct and pancreatic duct. Imaging showed irregularity in the ventral pancreatic duct in the head of the pancreas, and the jejunal tube was repositioned at D2 and D3.  A CT scan on March 04, 2024, showed extensive parenchymal calcifications in the head and body of the pancreas, pancreatic duct dilatation up to 12 mm, and extensive peripancreatic inflammatory change consistent with acute on chronic pancreatitis. Stable loculated fluid collections extended from the pancreatic head, and two additional hyperdense fluid collections were consistent with periduodenal hematoma. There was circumferential wall thickening and hyperemia involving the distal body of the stomach, antrum, and first portion of the duodenum with obliteration of the enteric lumen at that site.  He reports ongoing pain, currently at a level of 5 out of 10, which he describes as 'pretty normal for where I am.' He has been using oxycodone  for pain management, with the 10 mg dose providing relief for about four hours, but he ran out a  few days ago and is currently only using Tylenol .  He has a history of small intestinal bacterial overgrowth (SIBO), hypertension, and mechanical aortic valve replacement for which he is on warfarin. He has been unable to maintain his tube feeding schedule of 105 mL/hour for 16 hours due to pain, and he has been unable to use the J portion of the tube since Friday.  He is emotional during the encounter due to his current medical situation.  He has a scheduled ERCP for later in November.  Review of Systems As per HPI, otherwise negative  Current Medications, Allergies, Past Medical History, Past Surgical History, Family History and Social History were reviewed in [employer] record.    Objective:   Physical Exam BP 110/64   Pulse 74   Ht 5' 11 (1.803 m)   Wt 140 lb (63.5 kg)   BMI 19.53 kg/m  Gen: awake, alert, NAD, malnourished appearing HEENT: Icteric CV: RRR, no mrg Abd: soft, GJ tube in place in the left upper quadrant, diffuse epigastric tenderness,  +BS throughout Ext: no c/c/e Neuro: nonfocal  LABS Lipase: 535 (03/03/2024) Total bilirubin: 1.1 (03/03/2024) Alkaline phosphatase: 114 (03/03/2024) WBC: 11.1 (03/03/2024) Hb: 12 (03/03/2024) PLT: 381 (03/03/2024)  RADIOLOGY CT scan: Extensive parenchymal calcifications in the head and body of the pancreas, pancreatic duct dilatation up to 12 mm, extensive peripancreatic inflammatory change consistent with acute on chronic pancreatitis, stable loculated fluid collection extending from the pancreatic head, two hyperdense fluid collections adjacent to the antrum and first portion of the duodenum consistent with periduodenal hematoma measuring 2.2 x 4.9 cm and 1.2 x 2.6 cm, circumferential wall thickening and hyperemia involving the distal body of  the stomach, antrum, and first portion of the duodenum with obliteration of the enteric lumen, marked narrowing at the superior mesenteric vein and central splenic  vein at the confluence of the main portal vein with patent vessels (03/04/2024) Fluoroscopic guided replacement: Replacement of 18 French gastrojejunostomy tube, tip of the jejunostomy lumen lies in the second portion of the duodenum, both lumens ready for immediate use (03/19/2024)  DIAGNOSTIC ERCP: 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 Imaging shows parenchymal calcifications, duct dilatation, and peripancreatic inflammation. Stable loculated fluid collection in pancreatic head. Circumferential wall thickening and hyperemia in distal stomach, antrum, and duodenum with lumen obliteration. - Discussed ERCP and potential surgical intervention with Dr. Wilhelmenia - Consider hospital admission if symptoms unmanageable at home. - Consider surgical consultation for Pu[place]ow procedure.  Abdominal pain due to chronic pancreatitis Abdominal pain secondary to chronic pancreatitis, rated 5/10, exacerbated by feeding. Previous oxycodone  provided relief, currently out of medication. - Prescribed oxycodone  10 mg every 4 hours as needed. - Monitor pain levels, adjust treatment as necessary. - Consider hospital admission if pain uncontrolled.  Feeding intolerance with gastrojejunostomy tube Feeding intolerance with gastrojejunostomy tube, exacerbated by recent tube replacement. Pain and vomiting post-feeding. Current tube allows jejunal feeding past obstruction, pain limits tolerance. - Attempt jejunal feeding  with G-port venting to gravity. - Maintain NPO status except for jejunal feeds. - Monitor for pain and feeding tolerance. - Consider hospital admission if unable to tolerate feeds.  Dehydration Dehydration likely due to inadequate intake and feeding intolerance. Reports drinking water  but feels dehydrated. Labs pending for renal function and hydration status. - Ordered CBC, CMP, and lipase to assess hydration. - Ensure adequate hydration through jejunal feeding if possible. - Consider hospital admission for IV hydration if dehydration persists.  30 minutes total spent today including patient facing time, coordination of care, reviewing medical history/procedures/pertinent radiology studies, and documentation of the encounter.

## 2024-03-26 ENCOUNTER — Other Ambulatory Visit: Payer: Self-pay

## 2024-03-26 ENCOUNTER — Ambulatory Visit: Payer: Self-pay | Admitting: Internal Medicine

## 2024-03-26 DIAGNOSIS — K859 Acute pancreatitis without necrosis or infection, unspecified: Secondary | ICD-10-CM

## 2024-03-26 NOTE — Telephone Encounter (Signed)
 Pt scheduled for MR/MRCP at [facility] 10/17 at 7am, pt to arrive through the ER at 6:45am. Pt to be NPO after midnight. Pt aware.

## 2024-03-26 NOTE — Telephone Encounter (Signed)
 Bilirubin elevating as well. Suspect developing obstruction. Agree with imaging. If unable to get MRI quickly enough, then I think CTAP with IV/PO contrast would be just as helpful to delineate the biliary tree and evaluate for fluid collection development and pancreatitis. GM

## 2024-03-26 NOTE — Telephone Encounter (Signed)
 Patient with mild increase in bilirubin with significant increase in alkaline phosphatase, raises the question of a new fluid collection causing a partial biliary obstruction. His INR is supratherapeutic He needs to contact his warfarin clinic for advice and hold warfarin now until instructed to resume by his warfarin/Coumadin  clinic  It is possible that he will need to be admitted in the coming days if he cannot tolerate his jejunal tube feedings and hydration Please check on his progress  We need to perform a stat MRI abdomen plus MRCP; please see if this can be done today or tomorrow as an outpatient  Low threshold for ER and admission  CC as FYI: Dr. Wilhelmenia

## 2024-03-26 NOTE — Telephone Encounter (Signed)
 Spoke with pt and he knows to contact his coumadin  clinic now and to hold coumadin  until told to resume it by the coumadin  clinic.   Pt states he is more hydrated today but he was hurting yesterday evening so he did not start the feeding yet, plans to start that today as he feels better this am.  MRI ordered and he knows he will be getting a call back today regarding an appt.

## 2024-03-27 ENCOUNTER — Telehealth: Payer: Self-pay | Admitting: Internal Medicine

## 2024-03-27 ENCOUNTER — Ambulatory Visit (HOSPITAL_COMMUNITY)
Admission: RE | Admit: 2024-03-27 | Discharge: 2024-03-27 | Disposition: A | Source: Ambulatory Visit | Attending: Internal Medicine | Admitting: Internal Medicine

## 2024-03-27 ENCOUNTER — Other Ambulatory Visit: Payer: Self-pay | Admitting: Internal Medicine

## 2024-03-27 DIAGNOSIS — K859 Acute pancreatitis without necrosis or infection, unspecified: Secondary | ICD-10-CM | POA: Insufficient documentation

## 2024-03-27 DIAGNOSIS — K861 Other chronic pancreatitis: Secondary | ICD-10-CM | POA: Insufficient documentation

## 2024-03-27 DIAGNOSIS — K9423 Gastrostomy malfunction: Secondary | ICD-10-CM | POA: Diagnosis not present

## 2024-03-27 MED ORDER — GADOBUTROL 1 MMOL/ML IV SOLN
6.0000 mL | Freq: Once | INTRAVENOUS | Status: AC | PRN
  Administered 2024-03-27: 6 mL via INTRAVENOUS

## 2024-03-27 NOTE — Telephone Encounter (Signed)
 Inbound call from patient stating he has an Transport planner and is requesting a doctors note until the end of the year.  Requesting a call back  Please advise  Thank you

## 2024-03-27 NOTE — Telephone Encounter (Signed)
 Spoke with [name] [name] and let him know he needs to drop off the FMLA form with a note attached letting Dr albertus know about his request for a doctors note stating he will be out until the end of the year.  [name] [name] also stated that Dr albertus mentioned a surgical option where he would be hospitalized at [facility]. [name] [name] thinks he would like to do this especially if it could be done before the end of November.

## 2024-03-27 NOTE — Telephone Encounter (Signed)
*  STAT* If patient is at the pharmacy, call can be transferred to refill team.   1. Which medications need to be refilled? (please list name of each medication and dose if known) Xifaxan   2. Which pharmacy/location (including street and city if local pharmacy) [facility]  3. Do they need a 30 day or 90 day supply? [id number]

## 2024-03-27 NOTE — Telephone Encounter (Signed)
 Called patient to clarify a refill of xifaxan . Informed patient I cannot see we have prescribed this medication. Patient states he took it in the hospital and is not requesting a refill.

## 2024-03-28 ENCOUNTER — Telehealth: Payer: Self-pay | Admitting: Nurse Practitioner

## 2024-03-28 ENCOUNTER — Inpatient Hospital Stay (HOSPITAL_COMMUNITY)
Admission: EM | Admit: 2024-03-28 | Discharge: 2024-04-08 | DRG: 326 | Disposition: A | Discharge status: Home / Self Care | Attending: Internal Medicine | Admitting: Internal Medicine

## 2024-03-28 ENCOUNTER — Other Ambulatory Visit: Payer: Self-pay

## 2024-03-28 ENCOUNTER — Encounter (HOSPITAL_COMMUNITY): Payer: Self-pay

## 2024-03-28 ENCOUNTER — Emergency Department (HOSPITAL_COMMUNITY)

## 2024-03-28 DIAGNOSIS — E43 Unspecified severe protein-calorie malnutrition: Secondary | ICD-10-CM | POA: Diagnosis present

## 2024-03-28 DIAGNOSIS — I10 Essential (primary) hypertension: Secondary | ICD-10-CM | POA: Diagnosis present

## 2024-03-28 DIAGNOSIS — Z7982 Long term (current) use of aspirin: Secondary | ICD-10-CM

## 2024-03-28 DIAGNOSIS — K861 Other chronic pancreatitis: Secondary | ICD-10-CM | POA: Diagnosis present

## 2024-03-28 DIAGNOSIS — Z79899 Other long term (current) drug therapy: Secondary | ICD-10-CM

## 2024-03-28 DIAGNOSIS — R109 Unspecified abdominal pain: Principal | ICD-10-CM

## 2024-03-28 DIAGNOSIS — Z681 Body mass index (BMI) 19 or less, adult: Secondary | ICD-10-CM

## 2024-03-28 DIAGNOSIS — K9423 Gastrostomy malfunction: Principal | ICD-10-CM | POA: Diagnosis present

## 2024-03-28 DIAGNOSIS — X58XXXA Exposure to other specified factors, initial encounter: Secondary | ICD-10-CM | POA: Diagnosis present

## 2024-03-28 DIAGNOSIS — R1310 Dysphagia, unspecified: Secondary | ICD-10-CM | POA: Diagnosis present

## 2024-03-28 DIAGNOSIS — T85698A Other mechanical complication of other specified internal prosthetic devices, implants and grafts, initial encounter: Secondary | ICD-10-CM

## 2024-03-28 DIAGNOSIS — K859 Acute pancreatitis without necrosis or infection, unspecified: Secondary | ICD-10-CM

## 2024-03-28 DIAGNOSIS — Z952 Presence of prosthetic heart valve: Secondary | ICD-10-CM

## 2024-03-28 DIAGNOSIS — M25511 Pain in right shoulder: Secondary | ICD-10-CM | POA: Diagnosis not present

## 2024-03-28 DIAGNOSIS — S20211A Contusion of right front wall of thorax, initial encounter: Secondary | ICD-10-CM | POA: Diagnosis present

## 2024-03-28 DIAGNOSIS — Z880 Allergy status to penicillin: Secondary | ICD-10-CM

## 2024-03-28 DIAGNOSIS — Z7901 Long term (current) use of anticoagulants: Secondary | ICD-10-CM

## 2024-03-28 DIAGNOSIS — R54 Age-related physical debility: Secondary | ICD-10-CM | POA: Diagnosis present

## 2024-03-28 DIAGNOSIS — R112 Nausea with vomiting, unspecified: Secondary | ICD-10-CM

## 2024-03-28 LAB — CBC
HCT: 31.2 % — ABNORMAL LOW (ref 39.0–52.0)
Hemoglobin: 10 g/dL — ABNORMAL LOW (ref 13.0–17.0)
MCH: 28.4 pg (ref 26.0–34.0)
MCHC: 32.1 g/dL (ref 30.0–36.0)
MCV: 88.6 fL (ref 80.0–100.0)
Platelets: 444 K/uL — ABNORMAL HIGH (ref 150–400)
RBC: 3.52 MIL/uL — ABNORMAL LOW (ref 4.22–5.81)
RDW: 14.8 % (ref 11.5–15.5)
WBC: 9.1 K/uL (ref 4.0–10.5)
nRBC: 0 % (ref 0.0–0.2)

## 2024-03-28 LAB — URINALYSIS, ROUTINE W REFLEX MICROSCOPIC
Bilirubin Urine: NEGATIVE
Glucose, UA: NEGATIVE mg/dL
Hgb urine dipstick: NEGATIVE
Ketones, ur: NEGATIVE mg/dL
Leukocytes,Ua: NEGATIVE
Nitrite: NEGATIVE
Protein, ur: NEGATIVE mg/dL
Specific Gravity, Urine: 1.029 (ref 1.005–1.030)
pH: 5 (ref 5.0–8.0)

## 2024-03-28 LAB — COMPREHENSIVE METABOLIC PANEL WITH GFR
ALT: 98 U/L — ABNORMAL HIGH (ref 0–44)
AST: 65 U/L — ABNORMAL HIGH (ref 15–41)
Albumin: 3.4 g/dL — ABNORMAL LOW (ref 3.5–5.0)
Alkaline Phosphatase: 628 U/L — ABNORMAL HIGH (ref 38–126)
Anion gap: 10 (ref 5–15)
BUN: 6 mg/dL (ref 6–20)
CO2: 29 mmol/L (ref 22–32)
Calcium: 8.9 mg/dL (ref 8.9–10.3)
Chloride: 97 mmol/L — ABNORMAL LOW (ref 98–111)
Creatinine, Ser: 0.75 mg/dL (ref 0.61–1.24)
GFR, Estimated: >60 mL/min (ref >60)
Glucose, Bld: 103 mg/dL — ABNORMAL HIGH (ref 70–99)
Potassium: 3.6 mmol/L (ref 3.5–5.1)
Sodium: 136 mmol/L (ref 135–145)
Total Bilirubin: 0.8 mg/dL (ref 0.0–1.2)
Total Protein: 7.1 g/dL (ref 6.5–8.1)

## 2024-03-28 LAB — LIPASE, BLOOD: Lipase: 47 U/L (ref 11–51)

## 2024-03-28 MED ORDER — IOHEXOL 350 MG/ML SOLN
75.0000 mL | Freq: Once | INTRAVENOUS | Status: AC | PRN
  Administered 2024-03-28: 75 mL via INTRAVENOUS

## 2024-03-28 MED ORDER — ONDANSETRON HCL 4 MG/2ML IJ SOLN
4.0000 mg | Freq: Once | INTRAMUSCULAR | Status: AC
  Administered 2024-03-28: 4 mg via INTRAVENOUS
  Filled 2024-03-28: qty 2

## 2024-03-28 MED ORDER — OXYCODONE-ACETAMINOPHEN 5-325 MG PO TABS
1.0000 | ORAL_TABLET | Freq: Once | ORAL | Status: AC
  Administered 2024-03-28: 1 via ORAL
  Filled 2024-03-28: qty 1

## 2024-03-28 NOTE — Telephone Encounter (Signed)
 Patient called the answering service this am concerned that his G-J tube is malfunctioning. Recently

## 2024-03-28 NOTE — ED Triage Notes (Signed)
 States he has  GJ tube , had problems with his feeding tube last week and had to come to hospital , states he does feedings at night , and this morning he vomited his feeding

## 2024-03-28 NOTE — ED Provider Triage Note (Signed)
 Emergency Medicine Provider Triage Evaluation Note  [name] [name] , a 33 y.o. male  was evaluated in triage.  Pt complains of abdominal pain, vomiting.  He has a GJ tube in place secondary to history of chronic pancreatitis, inability to tolerate p.o.  Was doing a feeding overnight and began having vomiting.  Spoke with the GI doctor who wanted him to come in for evaluation.  History of GJ tube becoming dislodged at least 3 times already.  Review of Systems  Positive: Abdominal pain, nausea Negative:   Physical Exam  BP 126/89 (BP Location: Right Arm)   Pulse 90   Temp 98.1 F (36.7 C)   Resp 18   Ht 5' 11 (1.803 m)   Wt 63.5 kg   SpO2 96%   BMI 19.53 kg/m  Gen:   Awake, no distress   Resp:  Normal effort  MSK:   Moves extremities without difficulty  Other:  Diffuse tenderness to palpation throughout the epigastric region, no rebound, rigidity, guarding.  Medical Decision Making  Medically screening exam initiated at 4:14 PM.  Appropriate orders placed.  Jonmarc Raymond Rexford Bard was informed that the remainder of the evaluation will be completed by another provider, this initial triage assessment does not replace that evaluation, and the importance of remaining in the ED until their evaluation is complete.  Workup initiated in triage    Rosan Sherlean DEL, [place] 03/28/24 1614

## 2024-03-28 NOTE — Telephone Encounter (Signed)
 Darlyn called the answering service with concerns about GJ tube malfunctioning.  Recently had it replaced by IR.  He gets nocturnal tube feeds.  Around 3:30 AM he vomited tube feeds.   Patient had an MRCP done as outpatient yesterday.  Results reviewed.  GJ looks to be in correct position.  No evidence for bowel obstruction.    Unclear at this point why patient is vomiting with tube feeds.     Advesed patient to come to the ED for further evaluation.

## 2024-03-29 DIAGNOSIS — K9423 Gastrostomy malfunction: Secondary | ICD-10-CM | POA: Diagnosis not present

## 2024-03-29 LAB — CBC
HCT: 27.1 % — ABNORMAL LOW (ref 39.0–52.0)
Hemoglobin: 8.7 g/dL — ABNORMAL LOW (ref 13.0–17.0)
MCH: 28.4 pg (ref 26.0–34.0)
MCHC: 32.1 g/dL (ref 30.0–36.0)
MCV: 88.6 fL (ref 80.0–100.0)
Platelets: 309 K/uL (ref 150–400)
RBC: 3.06 MIL/uL — ABNORMAL LOW (ref 4.22–5.81)
RDW: 14.7 % (ref 11.5–15.5)
WBC: 5.9 K/uL (ref 4.0–10.5)
nRBC: 0 % (ref 0.0–0.2)

## 2024-03-29 LAB — CREATININE, SERUM
Creatinine, Ser: 0.62 mg/dL (ref 0.61–1.24)
GFR, Estimated: >60 mL/min (ref >60)

## 2024-03-29 LAB — PROTIME-INR
INR: 1.4 — ABNORMAL HIGH (ref 0.8–1.2)
Prothrombin Time: 18.1 s — ABNORMAL HIGH (ref 11.4–15.2)

## 2024-03-29 MED ORDER — HYDROXYZINE HCL 25 MG PO TABS: 25.0000 mg | ORAL_TABLET | Freq: Three times a day (TID) | ORAL | Status: DC | PRN

## 2024-03-29 MED ORDER — FENTANYL CITRATE (PF) 50 MCG/ML IJ SOSY
50.0000 ug | PREFILLED_SYRINGE | Freq: Once | INTRAMUSCULAR | Status: AC
  Administered 2024-03-29: 50 ug via INTRAVENOUS
  Filled 2024-03-29: qty 1

## 2024-03-29 MED ORDER — ENOXAPARIN SODIUM 60 MG/0.6ML IJ SOSY
60.0000 mg | PREFILLED_SYRINGE | Freq: Two times a day (BID) | INTRAMUSCULAR | Status: DC
  Administered 2024-03-29 – 2024-03-30 (×2): 60 mg via SUBCUTANEOUS
  Filled 2024-03-29 (×2): qty 0.6

## 2024-03-29 MED ORDER — HYDROMORPHONE HCL 1 MG/ML IJ SOLN
0.5000 mg | INTRAMUSCULAR | Status: DC | PRN
  Administered 2024-03-29 – 2024-03-30 (×3): 0.5 mg via INTRAVENOUS
  Filled 2024-03-29 (×3): qty 0.5

## 2024-03-29 MED ORDER — ENOXAPARIN SODIUM 40 MG/0.4ML IJ SOSY: 40.0000 mg | PREFILLED_SYRINGE | INTRAMUSCULAR | Status: DC

## 2024-03-29 MED ORDER — DIPHENHYDRAMINE HCL 50 MG/ML IJ SOLN
12.5000 mg | Freq: Once | INTRAMUSCULAR | Status: AC
Start: 2024-03-29 — End: 2024-03-29
  Administered 2024-03-29: 12.5 mg via INTRAVENOUS
  Filled 2024-03-29: qty 1

## 2024-03-29 MED ORDER — ONDANSETRON HCL 4 MG/2ML IJ SOLN
4.0000 mg | Freq: Four times a day (QID) | INTRAMUSCULAR | Status: DC | PRN
  Administered 2024-03-29 – 2024-04-07 (×9): 4 mg via INTRAVENOUS
  Filled 2024-03-29 (×10): qty 2

## 2024-03-29 MED ORDER — PANCRELIPASE (LIP-PROT-AMYL) 36000-114000 UNITS PO CPEP
72000.0000 [IU] | ORAL_CAPSULE | Freq: Three times a day (TID) | ORAL | Status: DC
  Filled 2024-03-29 (×33): qty 2

## 2024-03-29 MED ORDER — ASPIRIN 81 MG PO TBEC
81.0000 mg | DELAYED_RELEASE_TABLET | Freq: Every day | ORAL | Status: DC
  Administered 2024-03-30: 81 mg via ORAL
  Filled 2024-03-29: qty 1

## 2024-03-29 MED ORDER — METHOCARBAMOL 500 MG PO TABS
500.0000 mg | ORAL_TABLET | Freq: Four times a day (QID) | ORAL | Status: DC | PRN
  Administered 2024-03-31 – 2024-04-06 (×4): 500 mg via ORAL
  Filled 2024-03-29 (×5): qty 1

## 2024-03-29 MED ORDER — HALOPERIDOL LACTATE 5 MG/ML IJ SOLN
2.0000 mg | Freq: Once | INTRAMUSCULAR | Status: AC
  Administered 2024-03-29: 2 mg via INTRAVENOUS
  Filled 2024-03-29: qty 1

## 2024-03-29 MED ORDER — WARFARIN - PHARMACIST DOSING INPATIENT: Freq: Every day | Status: DC

## 2024-03-29 MED ORDER — PANTOPRAZOLE SODIUM 40 MG PO TBEC
40.0000 mg | DELAYED_RELEASE_TABLET | Freq: Two times a day (BID) | ORAL | Status: DC
  Filled 2024-03-29: qty 1

## 2024-03-29 MED ORDER — OXYCODONE HCL 5 MG PO TABS
5.0000 mg | ORAL_TABLET | ORAL | Status: DC | PRN
  Administered 2024-03-29 (×2): 5 mg via ORAL
  Filled 2024-03-29 (×2): qty 1

## 2024-03-29 MED ORDER — WARFARIN SODIUM 6 MG PO TABS
6.0000 mg | ORAL_TABLET | Freq: Once | ORAL | Status: DC
  Administered 2024-03-29: 6 mg via ORAL
  Filled 2024-03-29 (×2): qty 1

## 2024-03-29 MED ORDER — METOPROLOL SUCCINATE ER 25 MG PO TB24
12.5000 mg | ORAL_TABLET | Freq: Every day | ORAL | Status: DC
  Administered 2024-03-30 – 2024-04-07 (×7): 12.5 mg via ORAL
  Filled 2024-03-29 (×9): qty 1

## 2024-03-29 MED ORDER — SODIUM CHLORIDE 0.9 % IV SOLN: INTRAVENOUS | Status: DC

## 2024-03-29 NOTE — ED Notes (Signed)
 Awaiting creon  from main pharmacy

## 2024-03-29 NOTE — Progress Notes (Addendum)
 PHARMACY - ANTICOAGULATION CONSULT NOTE  Pharmacy Consult for warfarin Indication: mechanical aortic valve (On-X)  Allergies  Allergen Reactions   Amoxicillin Hives    Patient Measurements: Height: 5' 11 (180.3 cm) Weight: 63.5 kg (140 lb) IBW/kg (Calculated) : 75.3 HEPARIN  DW (KG): 63.5  Vital Signs: Temp: 97.6 F (36.4 C) (10/19 0758) Temp Source: Axillary (10/19 0758) BP: 120/86 (10/19 0758) Pulse Rate: 64 (10/19 0758)  Labs: Recent Labs    03/28/24 1620 03/29/24 0107  HGB 10.0*  --   HCT 31.2*  --   PLT 444*  --   LABPROT  --  18.1*  INR  --  1.4*  CREATININE 0.75  --     Estimated Creatinine Clearance: 118 mL/min (by C-G formula based on SCr of 0.75 mg/dL).   Medical History: Past Medical History:  Diagnosis Date   Abnormal finding on GI tract imaging 12/07/2023   Aortic valve calcification 06/08/2020   Asthma    Headache    migraines   Heart murmur    Hypokalemia 06/08/2020   Leukocytosis 01/23/2022   Mild aortic stenosis    Pancreatitis    Pneumonia    as a child x several   Substance abuse (HCC)    Subtherapeutic international normalized ratio (INR) 01/23/2022   Transaminitis 06/08/2020    Assessment: 33 yo M with mechanical aortic On-X valve. Pharmacy consulted for warfarin management while pt is inpatient.  INR 1.4 on admission (subtherapeutic)  Home regimen: has warfarin 3mg  tablets - 1.5 tabs (4.5mg ) daily except 2 tabs (6 mg) on Mon, Wed  Goal of Therapy:  INR 1.5-2 Monitor platelets by anticoagulation protocol: Yes   Plan:  Warfarin 6mg  PO x1 today Daily INR, CBC  F/u dietary changes and DDIs F/u IR plans and evaluation -primary MD to notify pharmacy if pt unable to do PO medications   Sharyne Glatter, PharmD, BCCCP Critical Care Clinical Pharmacist 03/29/2024 10:08 AM

## 2024-03-29 NOTE — ED Provider Notes (Signed)
 [facility] [facility]ERGENCY DEPARTMENT AT [facility] Provider Note   CSN: [id number] Arrival date & time: 03/28/24  1449     Patient presents with: Abdominal Pain   [name] [name] is a 33 y.o. male.   The history is provided by the patient.  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      Past Medical History:  Diagnosis Date   Abnormal finding on GI tract imaging 12/07/2023   Aortic valve calcification 06/08/2020   Asthma    Headache    migraines   Heart murmur    Hypokalemia 06/08/2020   Leukocytosis 01/23/2022   Mild aortic stenosis    Pancreatitis    Pneumonia    as a child x several   Substance abuse (HCC)    Subtherapeutic international normalized ratio (INR) 01/23/2022   Transaminitis 06/08/2020     Prior to Admission medications   Medication Sig Start Date End Date Taking? Authorizing Provider  acetaminophen  (TYLENOL ) 500 MG tablet Take 1,000 mg by mouth every 6 (six) hours as needed for moderate pain (pain score 4-6).    [provider]  aspirin  EC 81 MG EC tablet Take 1 tablet (81 mg total) by mouth daily. Swallow whole. 10/02/21   Barrett, Erin R, [name]  hydrOXYzine  (ATARAX ) 25 MG tablet Take 1 tablet (25 mg total) by mouth 3 (three) times daily as needed for anxiety. 02/17/24   Sigdel, Santosh, MD  lipase/protease/amylase (CREON ) 36000 UNITS CPEP capsule Take 2 capsules (72,000 Units total) by mouth 3 (three) times daily before meals.  12/09/23   Perri DELENA Meliton Mickey., MD  loratadine  (CLARITIN ) 10 MG tablet Take 10 mg by mouth daily.    [provider]  methocarbamol  (ROBAXIN ) 500 MG tablet Take 1-2 tablets (500-1,000 mg total) by mouth 4 (four) times daily. Patient taking differently: Take 500 mg by mouth daily as needed for muscle spasms. 11/19/23   Lucius Krabbe, NP  metoprolol  succinate (TOPROL  XL) 25 MG 24 hr tablet Take 0.5 tablets (12.5 mg total) by mouth at bedtime. 07/10/23   Thukkani, Arun K, MD  ondansetron  (ZOFRAN -ODT) 4 MG disintegrating tablet Take 4 mg by mouth every 8 (eight) hours as needed. 02/24/24   [provider]  Oxycodone  HCl 10 MG TABS Take 1 tablet (10 mg total) by mouth every 4 (four) hours as needed for up to 14 days. 03/25/24 04/08/24  Pyrtle, Gordy HERO, MD  pantoprazole  (PROTONIX ) 40 MG tablet Take 1 tablet (40 mg total) by mouth 2 (two) times daily before a meal. 03/09/24 04/08/24  Mansouraty, Aloha Mickey., MD  polyethylene glycol powder (GLYCOLAX /MIRALAX ) 17 GM/SCOOP powder Place 1 capful (17 g) into feeding tube 2 (two) times daily. 02/17/24   Sigdel, Santosh, MD  warfarin (COUMADIN ) 3 MG tablet TAKE 1 & 1/2 TABLETS TO 2 TABLETS BY MOUTH DAILY OR AS DIRECTED BY COAGULATION CLINIC 02/17/24   Mcarthur Pick, MD    Allergies: Amoxicillin    Review of Systems  Constitutional:  Negative for fever.  Cardiovascular:  Negative for chest pain.  Gastrointestinal:  Positive for abdominal pain and nausea. Negative for anorexia and vomiting.  All other systems reviewed and are negative.   Updated Vital Signs BP 114/83 (BP Location: Right Arm)   Pulse 74   Temp 97.9 F (36.6 C)   Resp (!) 21   Ht 5' 11 (1.803 m)   Wt 63.5 kg   SpO2 100%   BMI 19.53 kg/m   Physical Exam Vitals and nursing note reviewed.  Constitutional:      General: He is not in acute distress.    Appearance: He is well-developed. He is not diaphoretic.  HENT:     Head: Normocephalic and atraumatic.     Nose:  Nose normal.  Eyes:     Conjunctiva/sclera: Conjunctivae normal.     Pupils: Pupils are equal, round, and reactive to light.  Cardiovascular:     Rate and Rhythm: Normal rate and regular rhythm.     Pulses: Normal pulses.     Heart sounds: Normal heart sounds.  Pulmonary:     Effort: Pulmonary effort is normal.     Breath sounds: Normal breath sounds. No wheezing or rales.  Abdominal:     General: Bowel sounds are normal.     Palpations: Abdomen is soft.     Tenderness: There is no abdominal tenderness. There is no guarding or rebound.  Musculoskeletal:        General: Normal range of motion.     Cervical back: Normal range of motion and neck supple.  Skin:    General: Skin is warm and dry.     Capillary Refill: Capillary refill takes less than 2 seconds.  Neurological:     General: No focal deficit present.     Mental Status: He is alert and oriented to person, place, and time.     Deep Tendon Reflexes: Reflexes normal.  Psychiatric:        Mood and Affect: Mood normal.     (all labs ordered are listed, but only abnormal results are displayed) Results for orders placed or performed during the hospital encounter of 03/28/24  Urinalysis, Routine w reflex microscopic -Urine, Clean Catch   Collection Time: 03/28/24  4:11 PM  Result Value Ref Range   Color, Urine AMBER (A) YELLOW   APPearance HAZY (A) CLEAR   Specific Gravity, Urine 1.029 1.005 - 1.030   pH 5.0 5.0 - 8.0   Glucose, UA NEGATIVE NEGATIVE mg/dL   Hgb urine dipstick NEGATIVE NEGATIVE   Bilirubin Urine NEGATIVE NEGATIVE   Ketones, ur NEGATIVE NEGATIVE mg/dL   Protein, ur NEGATIVE NEGATIVE mg/dL   Nitrite NEGATIVE NEGATIVE   Leukocytes,Ua NEGATIVE NEGATIVE  Lipase, blood   Collection Time: 03/28/24  4:20 PM  Result Value Ref Range   Lipase 47 11 - 51 U/L  Comprehensive metabolic panel   Collection Time: 03/28/24  4:20 PM  Result Value Ref Range   Sodium 136 135 - 145 mmol/L   Potassium 3.6 3.5 - 5.1 mmol/L    Chloride 97 (L) 98 - 111 mmol/L   CO2 29 22 - 32 mmol/L   Glucose, Bld 103 (H) 70 - 99 mg/dL   BUN 6 6 - 20 mg/dL   Creatinine, Ser 9.24 0.61 - 1.24 mg/dL   Calcium  8.9 8.9 - 10.3 mg/dL   Total Protein 7.1 6.5 - 8.1 g/dL   Albumin  3.4 (L) 3.5 - 5.0 g/dL   AST 65 (H) 15 - 41 U/L   ALT  98 (H) 0 - 44 U/L   Alkaline Phosphatase 628 (H) 38 - 126 U/L   Total Bilirubin 0.8 0.0 - 1.2 mg/dL   GFR, Estimated >39 >39 mL/min   Anion gap 10 5 - 15  CBC   Collection Time: 03/28/24  4:20 PM  Result Value Ref Range   WBC 9.1 4.0 - 10.5 K/uL   RBC 3.52 (L) 4.22 - 5.81 MIL/uL   Hemoglobin 10.0 (L) 13.0 - 17.0 g/dL   HCT 68.7 (L) 60.9 - 47.9 %   MCV 88.6 80.0 - 100.0 fL   MCH 28.4 26.0 - 34.0 pg   MCHC 32.1 30.0 - 36.0 g/dL   RDW 85.1 88.4 - 84.4 %   Platelets 444 (H) 150 - 400 K/uL   nRBC 0.0 0.0 - 0.2 %  Protime-INR   Collection Time: 03/29/24  1:07 AM  Result Value Ref Range   Prothrombin Time 18.1 (H) 11.4 - 15.2 seconds   INR 1.4 (H) 0.8 - 1.2   CT ABDOMEN PELVIS W CONTRAST Result Date: 03/28/2024 CLINICAL DATA:  Abdominal pain, acute, nonlocalized. Concern for GJ tube out of place. EXAM: CT ABDOMEN AND PELVIS WITH CONTRAST TECHNIQUE: Multidetector CT imaging of the abdomen and pelvis was performed using the standard protocol following bolus administration of intravenous contrast. RADIATION DOSE REDUCTION: This exam was performed according to the departmental dose-optimization program which includes automated exposure control, adjustment of the mA and/or kV according to patient size and/or use of iterative reconstruction technique. CONTRAST:  75mL OMNIPAQUE  IOHEXOL  350 MG/ML SOLN COMPARISON:  03/04/2024, 03/27/2024. FINDINGS: Lower chest: No acute abnormality. Hepatobiliary: Hypodense region is noted in the left lobe of the liver adjacent to the falciform ligament suggesting focal fatty infiltration. The gallbladder is without stones. Intrahepatic biliary ductal dilatation. The common bile  duct is normal in caliber. Pancreas: Coarse calcifications are noted in the pancreas, most pronounced at the head suggesting chronic pancreatitis. There is pancreatic ductal dilatation measuring at up to 1 cm in the tail, not significantly changed from the previous exam. Mild edema and fat stranding is unchanged. Loculated fluid collection at the head of the pancreas is not well delineated, however appears smaller as compared with the previous exam. Spleen: A small hypodense region is noted in the posterior aspect of the spleen with surrounding hypervascularity, query hemangioma. Adrenals/Urinary Tract: The adrenal glands are within normal limits. The kidneys enhance symmetrically. No renal calculus or hydronephrosis bilaterally. The bladder is unremarkable. Stomach/Bowel: A GJ tube loops in the stomach and terminates in the proximal duodenum. There is thickening of the walls of the duodenum and gastric antrum. No bowel obstruction, free air, or pneumatosis. Appendix appears normal. Vascular/Lymphatic: There is focal narrowing of the confluence of the portal vein, splenic vein, and superior mesenteric vein. No obvious filling defect is seen. Enlarged lymph nodes are noted about the pancreatic head and proximal duodenum, likely reactive. Reproductive: Prostate is unremarkable. Other: No abdominopelvic ascites. Musculoskeletal: No acute osseous abnormality. Sternotomy wires are present over the midline. IMPRESSION: 1. GJ tube loops in the stomach and terminates in the proximal duodenum. 2. Stable findings of acute on chronic pancreatitis with associated gastritis and duodenitis. 3. Loculated fluid collection adjacent to the head of the pancreas is not well delineated, however appears smaller as compared with the previous exam. 4. 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 Electronically Signed   By: Leita Birmingham M.D.   On: 03/28/2024 18:48  MR  ABDOMEN MRCP W WO CONTAST Result Date: 03/27/2024 CLINICAL DATA:  elevated labs/?partial biliary obstruction; acute on chronic pancreatitis. EXAM: MRI ABDOMEN WITHOUT AND WITH CONTRAST (INCLUDING MRCP) TECHNIQUE: Multiplanar multisequence MR imaging of the abdomen was performed both before and after the administration of intravenous contrast. Heavily T2-weighted images of the biliary and pancreatic ducts were obtained, and three-dimensional MRCP images were rendered by post processing. CONTRAST:  6mL GADAVIST  GADOBUTROL  1 MMOL/ML IV SOLN COMPARISON:  CT scan abdomen and pelvis from 03/04/2024 and MRI abdomen from 01/23/2024. FINDINGS: Lower chest: Unremarkable MR appearance to the lung bases. No pleural effusion. No pericardial effusion. Normal heart size. Hepatobiliary: The liver is normal in size. Noncirrhotic configuration. No focal liver lesion. There is new mild central intrahepatic biliary ductal dilation. There is also moderate extrahepatic bile duct dilation. The extrahepatic bile duct measures up to 15 mm in the proximal portion and then gradually narrows in the midportion with smooth walls. At the junction of upper 2/3 and lower 1/3 it measures up to 3.5-4 mm and in the distal portion it measures up to 1-2 mm. No discrete obstructing mass or choledocholithiasis seen. 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 Physiologically distended gallbladder. No abnormal wall thickening or pericholecystic fat stranding. No cholelithiasis. Pancreas: Heterogeneous signal intensity of the pancreas. There is mild T1 hyperintense signal in the distal pancreatic body and tail region; however, the proximal pancreas exhibit diffuse interstitial edema with fuzzy margins and peripancreatic fat stranding. There is heterogeneity and slightly comparative less enhancement of the pancreatic head/uncinate process and proximal body in relation to the rest of the distal pancreas. There  is also marked dilation of the main pancreatic duct which measures up to 10-10.5 mm in the pancreatic body region. There are innumerable dilated/prominent pancreatic side branches. The main pancreatic duct gradually tapers before the ampulla of Vater. There are several peripancreatic fluid collections with largest along the anterior aspect of the head measuring up to 1.3 x 2.8 cm (series 6, image 26). The collection exhibits smooth enhancing walls. Overall, the collection has significantly decreased in size since the prior MRI from 01/23/2024. Spleen: Top-normal in size. Normal signal intensity. No focal lesion. Adrenals/Urinary Tract: Unremarkable adrenal glands. No hydroureteronephrosis. No suspicious renal mass. Stomach/Bowel: There is a at least 3.3 x 4.6 cm diverticulum arising from the fundus of the stomach. Percutaneous gastrojejunostomy tube is seen. Visualized portions within the abdomen are unremarkable. No disproportionate dilation of bowel loops. Vascular/Lymphatic: No pathologically enlarged lymph nodes identified. No abdominal aortic aneurysm demonstrated. No ascites. Other:  None. Musculoskeletal: No suspicious bone lesions identified. IMPRESSION: 1. 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 No discrete obstructing mass or choledocholithiasis seen. There is also marked dilation of the main pancreatic duct which gradually tapers before the Ampulla of Vater. There are innumerable dilated/prominent pancreatic side branches. There are several peripancreatic fluid collections with largest along the anterior aspect of the head measuring up to 1.3 x 2.8 cm. Overall, the collection has significantly decreased in size since the prior MRI from 01/23/2024. 2. There is diffuse interstitial edema of the pancreas with fuzzy margins and peripancreatic fat stranding. There is heterogeneity and slightly comparative less enhancement of the pancreatic head/uncinate  process and proximal body in relation to the rest of the distal pancreas. Findings favor acute on chronic pancreatitis. 3. There is a 3.3 x 4.6 cm diverticulum arising from the fundus of the stomach. 4.  Multiple other nonacute observations, as described above. Electronically Signed   By: Ree Molt M.D.   On: 03/27/2024 08:31   MR 3D Recon At Scanner Result Date: 03/27/2024 CLINICAL DATA:  elevated labs/?partial biliary obstruction; acute on chronic pancreatitis. EXAM: MRI ABDOMEN WITHOUT AND WITH CONTRAST (INCLUDING MRCP) TECHNIQUE: Multiplanar multisequence MR imaging of the abdomen was performed both before and after the administration of intravenous contrast. Heavily T2-weighted images of the biliary and pancreatic ducts were obtained, and three-dimensional MRCP images were rendered by post processing. CONTRAST:  6mL GADAVIST  GADOBUTROL  1 MMOL/ML IV SOLN COMPARISON:  CT scan abdomen and pelvis from 03/04/2024 and MRI abdomen from 01/23/2024. FINDINGS: Lower chest: Unremarkable MR appearance to the lung bases. No pleural effusion. No pericardial effusion. Normal heart size. Hepatobiliary: The liver is normal in size. Noncirrhotic configuration. No focal liver lesion. There is new mild central intrahepatic biliary ductal dilation. There is also moderate extrahepatic bile duct dilation. The extrahepatic bile duct measures up to 15 mm in the proximal portion and then gradually narrows in the midportion with smooth walls. At the junction of upper 2/3 and lower 1/3 it measures up to 3.5-4 mm and in the distal portion it measures up to 1-2 mm. No discrete obstructing mass or choledocholithiasis seen. 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 Physiologically distended gallbladder. No abnormal wall thickening or pericholecystic fat stranding. No cholelithiasis. Pancreas: Heterogeneous signal intensity of the pancreas. There is mild T1 hyperintense signal in the  distal pancreatic body and tail region; however, the proximal pancreas exhibit diffuse interstitial edema with fuzzy margins and peripancreatic fat stranding. There is heterogeneity and slightly comparative less enhancement of the pancreatic head/uncinate process and proximal body in relation to the rest of the distal pancreas. There is also marked dilation of the main pancreatic duct which measures up to 10-10.5 mm in the pancreatic body region. There are innumerable dilated/prominent pancreatic side branches. The main pancreatic duct gradually tapers before the ampulla of Vater. There are several peripancreatic fluid collections with largest along the anterior aspect of the head measuring up to 1.3 x 2.8 cm (series 6, image 26). The collection exhibits smooth enhancing walls. Overall, the collection has significantly decreased in size since the prior MRI from 01/23/2024. Spleen: Top-normal in size. Normal signal intensity. No focal lesion. Adrenals/Urinary Tract: Unremarkable adrenal glands. No hydroureteronephrosis. No suspicious renal mass. Stomach/Bowel: There is a at least 3.3 x 4.6 cm diverticulum arising from the fundus of the stomach. Percutaneous gastrojejunostomy tube is seen. Visualized portions within the abdomen are unremarkable. No disproportionate dilation of bowel loops. Vascular/Lymphatic: No pathologically enlarged lymph nodes identified. No abdominal aortic aneurysm demonstrated. No ascites. Other:  None. Musculoskeletal: No suspicious bone lesions identified. IMPRESSION: 1. 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 No discrete obstructing mass or choledocholithiasis seen. There is also marked dilation of the main pancreatic duct which gradually tapers before the Ampulla of Vater. There are innumerable dilated/prominent pancreatic side branches. There are several peripancreatic fluid collections with largest along the anterior aspect of the head  measuring up to 1.3 x 2.8 cm. Overall, the collection has significantly decreased in size since the prior MRI from 01/23/2024. 2. There is diffuse interstitial edema of the pancreas with fuzzy margins and peripancreatic fat stranding. There is heterogeneity and slightly comparative less enhancement of the pancreatic head/uncinate process and proximal body in relation to the rest of the distal pancreas. Findings favor  acute on chronic pancreatitis. 3. There is a 3.3 x 4.6 cm diverticulum arising from the fundus of the stomach. 4. Multiple other nonacute observations, as described above. Electronically Signed   By: Ree Molt M.D.   On: 03/27/2024 08:31   IR GJ Tube Change Result Date: 03/19/2024 INDICATION: 33 year old male with history of pancreatitis status post percutaneous gastrojejunostomy tube placed on 02/13/2024 presenting with tube malfunction. EXAM: FLUOROSCOPIC GUIDED REPLACEMENT OF GASTROJEJUNOSTOMY TUBE COMPARISON:  02/13/2024 MEDICATIONS: None. CONTRAST:  25mL OMNIPAQUE  IOHEXOL  300 MG/ML SOLN, 25mL OMNIPAQUE  IOHEXOL  300 MG/ML SOLN, 25mL OMNIPAQUE  IOHEXOL  300 MG/ML SOLN FLUOROSCOPY TIME:  Seventy-eight mGy reference air kerma COMPLICATIONS: None. PROCEDURE: Informed written consent was obtained from the patient after a discussion of the risks and benefits. The upper abdomen and the external portion of the existing gastrojejunostomy tube was prepped and draped in the usual sterile fashion, and a sterile drape was applied covering the operative field. Maximum barrier sterile technique with sterile gowns and gloves were used for the procedure. A timeout was performed prior to the initiation of the procedure. Preprocedure radiograph demonstrated coiled position of the jejunal arm within the body of the stomach. The external portion of the catheter was cut to release the retention balloon. A stiff Glidewire was inserted via the gastrostomy port and coiled within the gastric lumen. A 10 French angled  sheath was then placed over the wire and used to direct the wire into the duodenum with moderate difficulty. The wire was advanced with the assistance of a Kumpe the catheter coaxially to the proximal jejunum. The catheter and sheath were removed and a new, 18 French balloon retention gastrojejunostomy tube was placed. The catheter tip was unable to be advanced beyond the second portion of the duodenum due to redundancy of the jejunal limb within the stomach. Balloon was inflated with 10 mL of sterile water . Contrast injection via the jejunostomy and gastric lumens confirmed appropriate functioning and positioning. A dressing was placed. The patient tolerated procedure well without immediate postprocedural complication. IMPRESSION: Successful fluoroscopic guided replacement of malpositioned 18 French gastrojejunostomy. The tip of the jejunostomy lumen lies within the second portion of the duodenum. Both lumens ready for immediate use. Ester Sides, MD [facility] Electronically Signed   By: Ester Sides M.D.   On: 03/19/2024 20:56   DG ERCP Result Date: 03/09/2024 CLINICAL DATA:  Chronic recurrent pancreatitis and pancreatic duct dilatation on MRCP EXAM: ERCP 14 fluoroscopic intra procedural views of the right upper quadrant submitted for interpretation TECHNIQUE: Multiple spot images obtained with the fluoroscopic device and submitted for interpretation post-procedure. FLUOROSCOPY: Radiation Exposure Index (as provided by the fluoroscopic device): 32.09 mGy Kerma COMPARISON:  None Available. FINDINGS: Images demonstrate what appears to be percutaneous enteric tube with retention balloon. Flexible endoscopy device present with minimal contrast injection and no definite advancement into the common bile duct. IMPRESSION: Images of an attempted common bile duct access with minimal contrast injection. These images were submitted for radiologic interpretation  only. Please see the procedural report for the amount of contrast and the fluoroscopy time utilized. Electronically Signed   By: Cordella Banner   On: 03/09/2024 16:09   DG C-Arm 1-60 Min-No Report Result Date: 03/09/2024 Fluoroscopy was utilized by the requesting physician.  No radiographic interpretation.   CT ABDOMEN PELVIS W CONTRAST Result Date: 03/04/2024 EXAM: CT ABDOMEN AND PELVIS WITH CONTRAST 03/04/2024 12:59:40 AM TECHNIQUE: CT of the abdomen and pelvis was performed with the administration of intravenous  contrast. Multiplanar reformatted images are provided for review. Automated exposure control, iterative reconstruction, and/or weight-based adjustment of the mA/kV was utilized to reduce the radiation dose to as low as reasonably achievable. 50mL of iohexol  (OMNIPAQUE ) 350 MG/ML injection was used. COMPARISON: None available. CLINICAL HISTORY: Pancreatitis, acute, severe. FINDINGS: LOWER CHEST: No acute abnormality. LIVER: The liver is unremarkable. GALLBLADDER AND BILE DUCTS: Layering hyperdense material within the gallbladder lumen may represent inspissated bile and/or sludge. No superimposed pericholecystic inflammatory change. PANCREAS: Extensive parenchymal calcifications are seen throughout the head and body of the pancreas in keeping with changes of chronic pancreatitis. The main pancreatic duct is dilated measuring up to 12 mm in diameter within the mid body appears centrally constricted by dense calcification within the pancreatic head. There are extensive peripancreatic inflammatory changes within the pancreatic or duodenal groove and adjacent to the distal body of the stomach. These may relate to changes of acute on chronic pancreatitis or severe gastritis/duodenitis. A loculated fluid collection extending from the head of the pancreas demonstrates a tortuous, lobulated coarse appearance, relatively stable in size since prior examination, measuring 1.5 x 3.5 cm. Two additional poorly  circumscribed hyperdense fluid collections are seen adjacent to the Gastric Antrum and first portion of the duodenum, in keeping with peri-duodenal hematoma, measuring up to 2.2 x 4.9 cm and 1.2 x 2.6 cm. STOMACH AND BOWEL: A balloon retention gastrojejunostomy is again identified, however, the jejunal limb of the catheter is now seen looping within the stomach. Extensive circumferential wall thickening and hyperemia involve the distal body of the stomach, Gastric Antrum, and the first portion of the duodenum with obliteration of the enteric lumen in this region. PERITONEUM AND RETROPERITONEUM: Small simple-appearing free fluid is now seen within the pelvis, new since prior examination. No free intraperitoneal gas. Inflammatory changes extend from the duodenal region into the greater omentum surrounding the transverse colon. VASCULATURE: There is marked narrowing of the superior mesenteric vein and central splenic vein at the confluence with the main portal vein, however, these vessels remain patent. LYMPH NODES: No lymphadenopathy. BONES AND SOFT TISSUES: No acute bone abnormality. IMPRESSION: 1. Extensive parenchymal calcifications in the pancreatic head and body, dilated main pancreatic duct (up to 12 mm) with central constriction by dense calcification, and extensive peripancreatic inflammatory changes, consistent with acute on chronic pancreatitis. 2. Loculated fluid collection (1.5 x 3.5 cm) extending from the pancreatic head, relatively stable in size since prior examination. 3. Two additional early circumscribed hyperdense fluid collections adjacent to the gastric antrum and first portion of the duodenum, consistent with peri-duodenal hematoma, measuring up to 2.2 x 4.9 cm and 1.2 x 2.6 cm. 4. Extensive circumferential wall thickening and hyperemia involving the distal body of the stomach, gastric antrum, and first portion of the duodenum with obliteration of the enteric lumen in this region, consistent with  severe gastritis/duodenitis. 5. 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 6. 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 7. Small simple-appearing free fluid within the pelvis, new since prior examination. Electronically signed by: Dorethia Molt MD 03/04/2024 01:18 AM EDT RP Workstation: [id number]    Radiology: CT ABDOMEN PELVIS W CONTRAST Result Date: 03/28/2024 CLINICAL DATA:  Abdominal pain, acute, nonlocalized. Concern for GJ tube out of place. EXAM: CT ABDOMEN AND PELVIS WITH CONTRAST TECHNIQUE: Multidetector CT imaging of the abdomen and pelvis was performed using the  standard protocol following bolus administration of intravenous contrast. RADIATION DOSE REDUCTION: This exam was performed according to the departmental dose-optimization program which includes automated exposure control, adjustment of the mA and/or kV according to patient size and/or use of iterative reconstruction technique. CONTRAST:  75mL OMNIPAQUE  IOHEXOL  350 MG/ML SOLN COMPARISON:  03/04/2024, 03/27/2024. FINDINGS: Lower chest: No acute abnormality. Hepatobiliary: Hypodense region is noted in the left lobe of the liver adjacent to the falciform ligament suggesting focal fatty infiltration. The gallbladder is without stones. Intrahepatic biliary ductal dilatation. The common bile duct is normal in caliber. Pancreas: Coarse calcifications are noted in the pancreas, most pronounced at the head suggesting chronic pancreatitis. There is pancreatic ductal dilatation measuring at up to 1 cm in the tail, not significantly changed from the previous exam. Mild edema and fat stranding is unchanged. Loculated fluid collection at the head of the pancreas is not well delineated, however appears smaller as compared with the previous exam. Spleen: A small hypodense region  is noted in the posterior aspect of the spleen with surrounding hypervascularity, query hemangioma. Adrenals/Urinary Tract: The adrenal glands are within normal limits. The kidneys enhance symmetrically. No renal calculus or hydronephrosis bilaterally. The bladder is unremarkable. Stomach/Bowel: A GJ tube loops in the stomach and terminates in the proximal duodenum. There is thickening of the walls of the duodenum and gastric antrum. No bowel obstruction, free air, or pneumatosis. Appendix appears normal. Vascular/Lymphatic: There is focal narrowing of the confluence of the portal vein, splenic vein, and superior mesenteric vein. No obvious filling defect is seen. Enlarged lymph nodes are noted about the pancreatic head and proximal duodenum, likely reactive. Reproductive: Prostate is unremarkable. Other: No abdominopelvic ascites. Musculoskeletal: No acute osseous abnormality. Sternotomy wires are present over the midline. IMPRESSION: 1. GJ tube loops in the stomach and terminates in the proximal duodenum. 2. Stable findings of acute on chronic pancreatitis with associated gastritis and duodenitis. 3. Loculated fluid collection adjacent to the head of the pancreas is not well delineated, however appears smaller as compared with the previous exam. 4. 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 Electronically Signed   By: Leita Birmingham M.D.   On: 03/28/2024 18:48   MR ABDOMEN MRCP W WO CONTAST Result Date: 03/27/2024 CLINICAL DATA:  elevated labs/?partial biliary obstruction; acute on chronic pancreatitis. EXAM: MRI ABDOMEN WITHOUT AND WITH CONTRAST (INCLUDING MRCP) TECHNIQUE: Multiplanar multisequence MR imaging of the abdomen was performed both before and after the administration of intravenous contrast. Heavily T2-weighted images of the biliary and pancreatic ducts were obtained, and three-dimensional MRCP images were rendered by post  processing. CONTRAST:  6mL GADAVIST  GADOBUTROL  1 MMOL/ML IV SOLN COMPARISON:  CT scan abdomen and pelvis from 03/04/2024 and MRI abdomen from 01/23/2024. FINDINGS: Lower chest: Unremarkable MR appearance to the lung bases. No pleural effusion. No pericardial effusion. Normal heart size. Hepatobiliary: The liver is normal in size. Noncirrhotic configuration. No focal liver lesion. There is new mild central intrahepatic biliary ductal dilation. There is also moderate extrahepatic bile duct dilation. The extrahepatic bile duct measures up to 15 mm in the proximal portion and then gradually narrows in the midportion with smooth walls. At the junction of upper 2/3 and lower 1/3 it measures up to 3.5-4 mm and in the distal portion it measures up to 1-2 mm. No discrete obstructing mass or choledocholithiasis seen. 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 Physiologically distended gallbladder.  No abnormal wall thickening or pericholecystic fat stranding. No cholelithiasis. Pancreas: Heterogeneous signal intensity of the pancreas. There is mild T1 hyperintense signal in the distal pancreatic body and tail region; however, the proximal pancreas exhibit diffuse interstitial edema with fuzzy margins and peripancreatic fat stranding. There is heterogeneity and slightly comparative less enhancement of the pancreatic head/uncinate process and proximal body in relation to the rest of the distal pancreas. There is also marked dilation of the main pancreatic duct which measures up to 10-10.5 mm in the pancreatic body region. There are innumerable dilated/prominent pancreatic side branches. The main pancreatic duct gradually tapers before the ampulla of Vater. There are several peripancreatic fluid collections with largest along the anterior aspect of the head measuring up to 1.3 x 2.8 cm (series 6, image 26). The collection exhibits smooth enhancing walls. Overall, the collection has  significantly decreased in size since the prior MRI from 01/23/2024. Spleen: Top-normal in size. Normal signal intensity. No focal lesion. Adrenals/Urinary Tract: Unremarkable adrenal glands. No hydroureteronephrosis. No suspicious renal mass. Stomach/Bowel: There is a at least 3.3 x 4.6 cm diverticulum arising from the fundus of the stomach. Percutaneous gastrojejunostomy tube is seen. Visualized portions within the abdomen are unremarkable. No disproportionate dilation of bowel loops. Vascular/Lymphatic: No pathologically enlarged lymph nodes identified. No abdominal aortic aneurysm demonstrated. No ascites. Other:  None. Musculoskeletal: No suspicious bone lesions identified. IMPRESSION: 1. 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 No discrete obstructing mass or choledocholithiasis seen. There is also marked dilation of the main pancreatic duct which gradually tapers before the Ampulla of Vater. There are innumerable dilated/prominent pancreatic side branches. There are several peripancreatic fluid collections with largest along the anterior aspect of the head measuring up to 1.3 x 2.8 cm. Overall, the collection has significantly decreased in size since the prior MRI from 01/23/2024. 2. There is diffuse interstitial edema of the pancreas with fuzzy margins and peripancreatic fat stranding. There is heterogeneity and slightly comparative less enhancement of the pancreatic head/uncinate process and proximal body in relation to the rest of the distal pancreas. Findings favor acute on chronic pancreatitis. 3. There is a 3.3 x 4.6 cm diverticulum arising from the fundus of the stomach. 4. Multiple other nonacute observations, as described above. Electronically Signed   By: Ree Molt M.D.   On: 03/27/2024 08:31   MR 3D Recon At Scanner Result Date: 03/27/2024 CLINICAL DATA:  elevated labs/?partial biliary obstruction; acute on chronic pancreatitis. EXAM: MRI  ABDOMEN WITHOUT AND WITH CONTRAST (INCLUDING MRCP) TECHNIQUE: Multiplanar multisequence MR imaging of the abdomen was performed both before and after the administration of intravenous contrast. Heavily T2-weighted images of the biliary and pancreatic ducts were obtained, and three-dimensional MRCP images were rendered by post processing. CONTRAST:  6mL GADAVIST  GADOBUTROL  1 MMOL/ML IV SOLN COMPARISON:  CT scan abdomen and pelvis from 03/04/2024 and MRI abdomen from 01/23/2024. FINDINGS: Lower chest: Unremarkable MR appearance to the lung bases. No pleural effusion. No pericardial effusion. Normal heart size. Hepatobiliary: The liver is normal in size. Noncirrhotic configuration. No focal liver lesion. There is new mild central intrahepatic biliary ductal dilation. There is also moderate extrahepatic bile duct dilation. The extrahepatic bile duct measures up to 15 mm in the proximal portion and then gradually narrows in the midportion with smooth walls. At the junction of upper 2/3 and lower 1/3 it measures up to 3.5-4 mm and in the distal portion it measures up to 1-2 mm. No discrete obstructing  mass or choledocholithiasis seen. 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 Physiologically distended gallbladder. No abnormal wall thickening or pericholecystic fat stranding. No cholelithiasis. Pancreas: Heterogeneous signal intensity of the pancreas. There is mild T1 hyperintense signal in the distal pancreatic body and tail region; however, the proximal pancreas exhibit diffuse interstitial edema with fuzzy margins and peripancreatic fat stranding. There is heterogeneity and slightly comparative less enhancement of the pancreatic head/uncinate process and proximal body in relation to the rest of the distal pancreas. There is also marked dilation of the main pancreatic duct which measures up to 10-10.5 mm in the pancreatic body region. There are innumerable  dilated/prominent pancreatic side branches. The main pancreatic duct gradually tapers before the ampulla of Vater. There are several peripancreatic fluid collections with largest along the anterior aspect of the head measuring up to 1.3 x 2.8 cm (series 6, image 26). The collection exhibits smooth enhancing walls. Overall, the collection has significantly decreased in size since the prior MRI from 01/23/2024. Spleen: Top-normal in size. Normal signal intensity. No focal lesion. Adrenals/Urinary Tract: Unremarkable adrenal glands. No hydroureteronephrosis. No suspicious renal mass. Stomach/Bowel: There is a at least 3.3 x 4.6 cm diverticulum arising from the fundus of the stomach. Percutaneous gastrojejunostomy tube is seen. Visualized portions within the abdomen are unremarkable. No disproportionate dilation of bowel loops. Vascular/Lymphatic: No pathologically enlarged lymph nodes identified. No abdominal aortic aneurysm demonstrated. No ascites. Other:  None. Musculoskeletal: No suspicious bone lesions identified. IMPRESSION: 1. 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 No discrete obstructing mass or choledocholithiasis seen. There is also marked dilation of the main pancreatic duct which gradually tapers before the Ampulla of Vater. There are innumerable dilated/prominent pancreatic side branches. There are several peripancreatic fluid collections with largest along the anterior aspect of the head measuring up to 1.3 x 2.8 cm. Overall, the collection has significantly decreased in size since the prior MRI from 01/23/2024. 2. There is diffuse interstitial edema of the pancreas with fuzzy margins and peripancreatic fat stranding. There is heterogeneity and slightly comparative less enhancement of the pancreatic head/uncinate process and proximal body in relation to the rest of the distal pancreas. Findings favor acute on chronic pancreatitis. 3. There is a 3.3 x  4.6 cm diverticulum arising from the fundus of the stomach. 4. Multiple other nonacute observations, as described above. Electronically Signed   By: Ree Molt M.D.   On: 03/27/2024 08:31     Procedures   Medications Ordered in the ED  0.9 %  sodium chloride  infusion ( Intravenous New Bag/Given 03/29/24 0146)  oxyCODONE -acetaminophen  (PERCOCET/ROXICET) 5-325 MG per tablet 1 tablet (1 tablet Oral Given 03/28/24 1618)  ondansetron  (ZOFRAN ) injection 4 mg (4 mg Intravenous Given 03/28/24 1621)  iohexol  (OMNIPAQUE ) 350 MG/ML injection 75 mL (75 mLs Intravenous Contrast Given 03/28/24 1820)  fentaNYL  (SUBLIMAZE ) injection 50 mcg (50 mcg Intravenous Given 03/29/24 0146)                                    Medical Decision Making Patient with abdominal pain and emesis   Amount and/or Complexity of Data Reviewed External Data Reviewed: labs and notes.    Details: Previous visits reviewed  Labs: ordered.    Details: INR low 1.4, lipas is normal 47. Normal sodium 136, normal potassium 3.6, normal creatinine.  Elvated AST 65, elevated ALT  98.  Normal urine  Radiology: ordered and independent interpretation performed.    Details: Lesion on pancreas by me   Risk Prescription drug management. Decision regarding hospitalization.     Final diagnoses:  Abdominal pain, unspecified abdominal location  Nausea and vomiting, unspecified vomiting type  Malfunction of device, initial encounter   The patient appears reasonably stabilized for admission considering the current resources, flow, and capabilities available in the ED at this time, and I doubt any other [facility] requiring further screening and/or treatment in the ED prior to admission.  ED Discharge Orders     None          Harley Mccartney, MD 03/29/24 [id number]

## 2024-03-29 NOTE — H&P (Signed)
 History and Physical    Patient: [name] [name] DOB: 1990/11/26 DOA: 03/28/2024 DOS: the patient was seen and examined on 03/29/2024 PCP: Lucius Krabbe, NP  Patient coming from: Home  Chief Complaint:  Chief Complaint  Patient presents with   Abdominal Pain   HPI: [name] [name] is a 33 y.o. male with medical history significant of chronic/recurrent pancreatitis, pancreatic pseudocyst, gastric pneumatosis, status post mechanical aortic valve replacement and aortoplasty, hypertension, alcohol use disorder, asthma, and SIBO sent by GI due to concern for GJ tube malfunction.   The patient reported experiencing vomiting yesterday morning and noted a feeling of fullness in the stomach upon waking, which was unusual given that the patient has a gastrojejunostomy (GJ) tube. The patient occasionally experienced nausea and vomiting, and mentioned taking some medications orally. The patient has been diagnosed with pancreatitis, which has caused a slowdown in gastrointestinal function. This is the third instance where issues have arisen with the GJ tube, previously requiring replacement. The patient expressed an interest in surgical options, specifically mentioning a desire for a Whipple procedure due to insufficient nutrition intake and the impact on quality of life. The patient was hoping to expedite surgical intervention.  In the ED, AFVSS. Labs unremarkable. CT showing GJ tube loops in the stomach and terminates in the proximal duodenum. EDP requested admission.   Review of Systems: As mentioned in the history of present illness. All other systems reviewed and are negative. Past Medical History:  Diagnosis Date   Abnormal finding on GI tract imaging 12/07/2023   Aortic valve calcification 06/08/2020   Asthma    Headache    migraines   Heart murmur    Hypokalemia 06/08/2020   Leukocytosis 01/23/2022   Mild aortic stenosis    Pancreatitis     Pneumonia    as a child x several   Substance abuse (HCC)    Subtherapeutic international normalized ratio (INR) 01/23/2022   Transaminitis 06/08/2020   Past Surgical History:  Procedure Laterality Date   AORTIC VALVE REPLACEMENT N/A 09/27/2021   Procedure: AORTIC VALVE REPLACEMENT (AVR) USING ON-X AORTIC VALVE SIZE ;  Surgeon: Kerrin Elspeth BROCKS, MD;  Location: [facility] OR;  Service: Open Heart Surgery;  Laterality: N/A;   BIOPSY  08/01/2023   Procedure: BIOPSY;  Surgeon: Wilhelmenia Aloha Raddle., MD;  Location: [facility];  Service: Gastroenterology;;   ERCP N/A 03/09/2024   Procedure: ERCP, WITH INTERVENTION IF INDICATED;  Surgeon: Wilhelmenia Aloha Raddle., MD;  Location: [facility];  Service: Gastroenterology;  Laterality: N/A;  aborted   ESOPHAGOGASTRODUODENOSCOPY N/A 08/01/2023   Procedure: ESOPHAGOGASTRODUODENOSCOPY (EGD);  Surgeon: Wilhelmenia Aloha Raddle., MD;  Location: [facility];  Service: Gastroenterology;  Laterality: N/A;   ESOPHAGOGASTRODUODENOSCOPY N/A 03/09/2024   Procedure: EGD (ESOPHAGOGASTRODUODENOSCOPY);  Surgeon: Wilhelmenia Aloha Raddle., MD;  Location: [facility];  Service: Gastroenterology;  Laterality: N/A;   EUS N/A 08/01/2023   Procedure: UPPER ENDOSCOPIC ULTRASOUND (EUS) RADIAL;  Surgeon: Wilhelmenia Aloha Raddle., MD;  Location: [facility];  Service: Gastroenterology;  Laterality: N/A;   FINE NEEDLE ASPIRATION N/A 08/01/2023   Procedure: FINE NEEDLE ASPIRATION (FNA) LINEAR;  Surgeon: Wilhelmenia Aloha Raddle., MD;  Location: [facility];  Service: Gastroenterology;  Laterality: N/A;   INGUINAL HERNIA REPAIR Right 01/05/2022   Procedure: OPEN REPAIR INCARCERATED RIGHT INGUINAL HERNIA WITH MESH;  Surgeon: Tanda Locus, MD;  Location: [facility];  Service: General;  Laterality: Right;  90 MIN - ROOM 2   INGUINAL HERNIA REPAIR Left 12/20/2022   Procedure: OPEN REPAIR  LEFT INGUINAL HERNIA WITH MESH;  Surgeon: Tanda Locus, MD;  Location: [facility];  Service: General;   Laterality: Left;   IR GASTR TUBE CONVERT GASTR-JEJ PER W/FL MOD SED  02/13/2024   IR GASTROSTOMY TUBE MOD SED  02/13/2024   IR GJ TUBE CHANGE  03/19/2024   REPLACEMENT ASCENDING AORTA N/A 09/27/2021   Procedure: REPLACEMENT ASCENDING AND INNOMINATE ANEURYSM USING HEMASHIELD PLATINUM [id number];  Surgeon: Kerrin Elspeth BROCKS, MD;  Location: [facility] OR;  Service: Open Heart Surgery;  Laterality: N/A;   RIGHT HEART CATH AND CORONARY ANGIOGRAPHY N/A 09/07/2021   Procedure: RIGHT HEART CATH AND CORONARY ANGIOGRAPHY;  Surgeon: Wendel Lurena POUR, MD;  Location: [facility];  Service: Cardiovascular;  Laterality: N/A;   TEE WITHOUT CARDIOVERSION N/A 09/27/2021   Procedure: TRANSESOPHAGEAL ECHOCARDIOGRAM (TEE);  Surgeon: Kerrin Elspeth BROCKS, MD;  Location: [facility] OR;  Service: Open Heart Surgery;  Laterality: N/A;   Social History:  reports that he has never smoked. He has never used smokeless tobacco. He reports that he does not currently use alcohol. He reports that he does not currently use drugs after having used the following drugs: Other-see comments.  Allergies  Allergen Reactions   Amoxicillin Hives    Family History  Problem Relation Age of Onset   Pancreatitis Maternal Uncle    Colon cancer Neg Hx    Esophageal cancer Neg Hx     Prior to Admission medications   Medication Sig Start Date End Date Taking? Authorizing Provider  acetaminophen  (TYLENOL ) 500 MG tablet Take 1,000 mg by mouth every 6 (six) hours as needed for moderate pain (pain score 4-6).    [provider]  aspirin  EC 81 MG EC tablet Take 1 tablet (81 mg total) by mouth daily. Swallow whole. 10/02/21   Barrett, Erin R, [name]  hydrOXYzine  (ATARAX ) 25 MG tablet Take 1 tablet (25 mg total) by mouth 3 (three) times daily as needed for anxiety. 02/17/24   Sigdel, Santosh, MD  lipase/protease/amylase (CREON ) 36000 UNITS CPEP capsule Take 2 capsules (72,000 Units total) by mouth 3 (three) times daily before meals. 12/09/23    Perri DELENA Meliton Mickey., MD  loratadine  (CLARITIN ) 10 MG tablet Take 10 mg by mouth daily.    [provider]  methocarbamol  (ROBAXIN ) 500 MG tablet Take 1-2 tablets (500-1,000 mg total) by mouth 4 (four) times daily. Patient taking differently: Take 500 mg by mouth daily as needed for muscle spasms. 11/19/23   Lucius Krabbe, NP  metoprolol  succinate (TOPROL  XL) 25 MG 24 hr tablet Take 0.5 tablets (12.5 mg total) by mouth at bedtime. 07/10/23   Thukkani, Arun K, MD  ondansetron  (ZOFRAN -ODT) 4 MG disintegrating tablet Take 4 mg by mouth every 8 (eight) hours as needed. 02/24/24   [provider]  Oxycodone  HCl 10 MG TABS Take 1 tablet (10 mg total) by mouth every 4 (four) hours as needed for up to 14 days. 03/25/24 04/08/24  Pyrtle, Gordy HERO, MD  pantoprazole  (PROTONIX ) 40 MG tablet Take 1 tablet (40 mg total) by mouth 2 (two) times daily before a meal. 03/09/24 04/08/24  Mansouraty, Aloha Mickey., MD  polyethylene glycol powder (GLYCOLAX /MIRALAX ) 17 GM/SCOOP powder Place 1 capful (17 g) into feeding tube 2 (two) times daily. 02/17/24   Sigdel, Santosh, MD  warfarin (COUMADIN ) 3 MG tablet TAKE 1 & 1/2 TABLETS TO 2 TABLETS BY MOUTH DAILY OR AS DIRECTED BY COAGULATION CLINIC 02/17/24   Mcarthur Pick, MD    Physical Exam: Vitals:   03/28/24  2356 03/29/24 0408 03/29/24 0552 03/29/24 0758  BP: 114/83 108/76  120/86  Pulse: 74 76  64  Resp: (!) 21 16  16   Temp: 97.9 F (36.6 C)  97.6 F (36.4 C) 97.6 F (36.4 C)  TempSrc:   Oral Axillary  SpO2: 100% 100%  100%  Weight:      Height:       General: Alert, oriented x3, resting comfortably in no acute distress Respiratory: Lungs clear to auscultation bilaterally with normal respiratory effort; no w/r/r Cardiovascular: Regular rate and rhythm w/o m/r/g Abdomen: Soft, nontender, nondistended. Positive bowel sounds   Data Reviewed:  Lab Results  Component Value Date   WBC 9.1 03/28/2024   HGB 10.0 (L) 03/28/2024   HCT 31.2 (L)  03/28/2024   MCV 88.6 03/28/2024   PLT 444 (H) 03/28/2024   Lab Results  Component Value Date   GLUCOSE 103 (H) 03/28/2024   CALCIUM  8.9 03/28/2024   NA 136 03/28/2024   K 3.6 03/28/2024   CO2 29 03/28/2024   CL 97 (L) 03/28/2024   BUN 6 03/28/2024   CREATININE 0.75 03/28/2024   Lab Results  Component Value Date   ALT 98 (H) 03/28/2024   AST 65 (H) 03/28/2024   GGT 21 12/07/2023   ALKPHOS 628 (H) 03/28/2024   BILITOT 0.8 03/28/2024   Lab Results  Component Value Date   INR 1.4 (H) 03/29/2024   INR 10.0 (HH) 03/25/2024   INR 1.0 02/17/2024   Radiology: CT ABDOMEN PELVIS W CONTRAST Result Date: 03/28/2024 CLINICAL DATA:  Abdominal pain, acute, nonlocalized. Concern for GJ tube out of place. EXAM: CT ABDOMEN AND PELVIS WITH CONTRAST TECHNIQUE: Multidetector CT imaging of the abdomen and pelvis was performed using the standard protocol following bolus administration of intravenous contrast. RADIATION DOSE REDUCTION: This exam was performed according to the departmental dose-optimization program which includes automated exposure control, adjustment of the mA and/or kV according to patient size and/or use of iterative reconstruction technique. CONTRAST:  75mL OMNIPAQUE  IOHEXOL  350 MG/ML SOLN COMPARISON:  03/04/2024, 03/27/2024. FINDINGS: Lower chest: No acute abnormality. Hepatobiliary: Hypodense region is noted in the left lobe of the liver adjacent to the falciform ligament suggesting focal fatty infiltration. The gallbladder is without stones. Intrahepatic biliary ductal dilatation. The common bile duct is normal in caliber. Pancreas: Coarse calcifications are noted in the pancreas, most pronounced at the head suggesting chronic pancreatitis. There is pancreatic ductal dilatation measuring at up to 1 cm in the tail, not significantly changed from the previous exam. Mild edema and fat stranding is unchanged. Loculated fluid collection at the head of the pancreas is not well delineated,  however appears smaller as compared with the previous exam. Spleen: A small hypodense region is noted in the posterior aspect of the spleen with surrounding hypervascularity, query hemangioma. Adrenals/Urinary Tract: The adrenal glands are within normal limits. The kidneys enhance symmetrically. No renal calculus or hydronephrosis bilaterally. The bladder is unremarkable. Stomach/Bowel: A GJ tube loops in the stomach and terminates in the proximal duodenum. There is thickening of the walls of the duodenum and gastric antrum. No bowel obstruction, free air, or pneumatosis. Appendix appears normal. Vascular/Lymphatic: There is focal narrowing of the confluence of the portal vein, splenic vein, and superior mesenteric vein. No obvious filling defect is seen. Enlarged lymph nodes are noted about the pancreatic head and proximal duodenum, likely reactive. Reproductive: Prostate is unremarkable. Other: No abdominopelvic ascites. Musculoskeletal: No acute osseous abnormality. Sternotomy wires are present over the midline.  IMPRESSION: 1. GJ tube loops in the stomach and terminates in the proximal duodenum. 2. Stable findings of acute on chronic pancreatitis with associated gastritis and duodenitis. 3. Loculated fluid collection adjacent to the head of the pancreas is not well delineated, however appears smaller as compared with the previous exam. 4. 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 Electronically Signed   By: Leita Birmingham M.D.   On: 03/28/2024 18:48     Assessment and Plan: 53M h/o chronic/recurrent pancreatitis, pancreatic pseudocyst, gastric pneumatosis, status post mechanical aortic valve replacement and aortoplasty, hypertension, alcohol use disorder, asthma, and SIBO sent by GI due to concern for GJ tube malfunction.   GJ tube malfunction -IR consulted; apprec eval/recs (will plan for procedure on 10/20 per Solmon Ku, PA) -MIVF:  LR at 125cc/h for now  Chronic pancreatitis -RD consulted for tube feed mgt once IR replaces GJ tube -PTA Creon  72,000U TID AC   S/p mechanical aortic valve -PTA warfarin, and ASA  HTN -PTA metoprolol  XL 12.5mg  daily   Advance Care Planning:   Code Status: Full Code   Consults: IR  Family Communication: N/A  Severity of Illness: The appropriate patient status for this patient is OBSERVATION. Observation status is judged to be reasonable and necessary in order to provide the required intensity of service to ensure the patient's safety. The patient's presenting symptoms, physical exam findings, and initial radiographic and laboratory data in the context of their medical condition is felt to place them at decreased risk for further clinical deterioration. Furthermore, it is anticipated that the patient will be medically stable for discharge from the hospital within 2 midnights of admission.    ------- I spent 55 minutes reviewing previous notes, at the bedside counseling/discussing the treatment plan, and performing clinical documentation.  Author: Marsha Ada, MD 03/29/2024 8:35 AM  For on call review [email or web address].

## 2024-03-30 ENCOUNTER — Other Ambulatory Visit: Payer: Self-pay

## 2024-03-30 ENCOUNTER — Ambulatory Visit: Payer: Self-pay | Admitting: Internal Medicine

## 2024-03-30 ENCOUNTER — Observation Stay (HOSPITAL_COMMUNITY)

## 2024-03-30 DIAGNOSIS — E43 Unspecified severe protein-calorie malnutrition: Secondary | ICD-10-CM | POA: Diagnosis present

## 2024-03-30 DIAGNOSIS — Z7901 Long term (current) use of anticoagulants: Secondary | ICD-10-CM | POA: Diagnosis not present

## 2024-03-30 DIAGNOSIS — Z952 Presence of prosthetic heart valve: Secondary | ICD-10-CM | POA: Diagnosis not present

## 2024-03-30 DIAGNOSIS — Z681 Body mass index (BMI) 19 or less, adult: Secondary | ICD-10-CM | POA: Diagnosis not present

## 2024-03-30 DIAGNOSIS — K861 Other chronic pancreatitis: Secondary | ICD-10-CM | POA: Diagnosis present

## 2024-03-30 DIAGNOSIS — S20211A Contusion of right front wall of thorax, initial encounter: Secondary | ICD-10-CM | POA: Diagnosis present

## 2024-03-30 DIAGNOSIS — I10 Essential (primary) hypertension: Secondary | ICD-10-CM | POA: Diagnosis present

## 2024-03-30 DIAGNOSIS — X58XXXA Exposure to other specified factors, initial encounter: Secondary | ICD-10-CM | POA: Diagnosis present

## 2024-03-30 DIAGNOSIS — M25511 Pain in right shoulder: Secondary | ICD-10-CM | POA: Diagnosis not present

## 2024-03-30 DIAGNOSIS — K9423 Gastrostomy malfunction: Secondary | ICD-10-CM | POA: Diagnosis present

## 2024-03-30 DIAGNOSIS — Z79899 Other long term (current) drug therapy: Secondary | ICD-10-CM | POA: Diagnosis not present

## 2024-03-30 DIAGNOSIS — Z7982 Long term (current) use of aspirin: Secondary | ICD-10-CM | POA: Diagnosis not present

## 2024-03-30 DIAGNOSIS — Z880 Allergy status to penicillin: Secondary | ICD-10-CM | POA: Diagnosis not present

## 2024-03-30 DIAGNOSIS — R54 Age-related physical debility: Secondary | ICD-10-CM | POA: Diagnosis present

## 2024-03-30 DIAGNOSIS — R1310 Dysphagia, unspecified: Secondary | ICD-10-CM | POA: Diagnosis present

## 2024-03-30 HISTORY — PX: IR GJ TUBE CHANGE: IMG1440

## 2024-03-30 LAB — BASIC METABOLIC PANEL WITH GFR
Anion gap: 9 (ref 5–15)
BUN: 6 mg/dL (ref 6–20)
CO2: 26 mmol/L (ref 22–32)
Calcium: 8.5 mg/dL — ABNORMAL LOW (ref 8.9–10.3)
Chloride: 106 mmol/L (ref 98–111)
Creatinine, Ser: 0.66 mg/dL (ref 0.61–1.24)
GFR, Estimated: >60 mL/min (ref >60)
Glucose, Bld: 104 mg/dL — ABNORMAL HIGH (ref 70–99)
Potassium: 3.7 mmol/L (ref 3.5–5.1)
Sodium: 141 mmol/L (ref 135–145)

## 2024-03-30 LAB — CBC
HCT: 27.2 % — ABNORMAL LOW (ref 39.0–52.0)
Hemoglobin: 8.7 g/dL — ABNORMAL LOW (ref 13.0–17.0)
MCH: 28.2 pg (ref 26.0–34.0)
MCHC: 32 g/dL (ref 30.0–36.0)
MCV: 88.3 fL (ref 80.0–100.0)
Platelets: 311 K/uL (ref 150–400)
RBC: 3.08 MIL/uL — ABNORMAL LOW (ref 4.22–5.81)
RDW: 14.9 % (ref 11.5–15.5)
WBC: 5.4 K/uL (ref 4.0–10.5)
nRBC: 0 % (ref 0.0–0.2)

## 2024-03-30 LAB — PROTIME-INR
INR: 1.9 — ABNORMAL HIGH (ref 0.8–1.2)
Prothrombin Time: 22.6 s — ABNORMAL HIGH (ref 11.4–15.2)

## 2024-03-30 MED ORDER — LIDOCAINE VISCOUS HCL 2 % MT SOLN
OROMUCOSAL | Status: AC
Start: 2024-03-30 — End: 2024-03-30
  Filled 2024-03-30: qty 15

## 2024-03-30 MED ORDER — IOHEXOL 300 MG/ML  SOLN
50.0000 mL | Freq: Once | INTRAMUSCULAR | Status: AC | PRN
  Administered 2024-03-30: 30 mL

## 2024-03-30 MED ORDER — WARFARIN - PHARMACIST DOSING INPATIENT: Freq: Every day | Status: DC

## 2024-03-30 MED ORDER — LIDOCAINE VISCOUS HCL 2 % MT SOLN
15.0000 mL | Freq: Once | OROMUCOSAL | Status: AC
  Administered 2024-03-30: 15 mL via OROMUCOSAL
  Filled 2024-03-30: qty 15

## 2024-03-30 MED ORDER — ADULT MULTIVITAMIN W/MINERALS CH
1.0000 | ORAL_TABLET | Freq: Every day | ORAL | Status: DC
  Administered 2024-03-31 – 2024-04-08 (×8): 1 via ORAL
  Filled 2024-03-30 (×8): qty 1

## 2024-03-30 MED ORDER — HEPARIN (PORCINE) 25000 UT/250ML-% IV SOLN
850.0000 [IU]/h | INTRAVENOUS | Status: DC
  Administered 2024-03-30: 850 [IU]/h via INTRAVENOUS
  Filled 2024-03-30: qty 250

## 2024-03-30 MED ORDER — WARFARIN SODIUM 3 MG PO TABS
3.0000 mg | ORAL_TABLET | Freq: Once | ORAL | Status: DC
  Filled 2024-03-30: qty 1

## 2024-03-30 MED ORDER — LIDOCAINE HCL 1 % IJ SOLN
INTRAMUSCULAR | Status: AC
  Filled 2024-03-30: qty 20

## 2024-03-30 MED ORDER — PROSOURCE TF20 ENFIT COMPATIBL EN LIQD: 60.0000 mL | Freq: Every day | ENTERAL | Status: DC

## 2024-03-30 MED ORDER — OSMOLITE 1.5 CAL PO LIQD
1000.0000 mL | ORAL | Status: DC
  Filled 2024-03-30: qty 1000

## 2024-03-30 MED ORDER — WARFARIN SODIUM 4 MG PO TABS: 4.5000 mg | ORAL_TABLET | Freq: Once | ORAL | Status: DC

## 2024-03-30 MED ORDER — PANTOPRAZOLE SODIUM 40 MG IV SOLR
40.0000 mg | INTRAVENOUS | Status: DC
Start: 2024-03-30 — End: 2024-04-08
  Administered 2024-03-30 – 2024-04-08 (×10): 40 mg via INTRAVENOUS
  Filled 2024-03-30 (×10): qty 10

## 2024-03-30 MED ORDER — ADULT MULTIVITAMIN W/MINERALS CH: 1.0000 | ORAL_TABLET | Freq: Every day | ORAL | Status: DC

## 2024-03-30 MED ORDER — THIAMINE MONONITRATE 100 MG PO TABS
100.0000 mg | ORAL_TABLET | Freq: Every day | ORAL | Status: DC
  Administered 2024-03-31: 100 mg via ORAL
  Filled 2024-03-30: qty 1

## 2024-03-30 MED ORDER — HYDROMORPHONE HCL 1 MG/ML IJ SOLN
0.5000 mg | INTRAMUSCULAR | Status: DC | PRN
  Administered 2024-03-30 – 2024-04-07 (×43): 0.5 mg via INTRAVENOUS
  Filled 2024-03-30 (×44): qty 0.5

## 2024-03-30 MED ORDER — KCL IN DEXTROSE-NACL 20-5-0.45 MEQ/L-%-% IV SOLN
INTRAVENOUS | Status: AC
  Filled 2024-03-30 (×2): qty 1000

## 2024-03-30 MED ORDER — THIAMINE MONONITRATE 100 MG PO TABS: 100.0000 mg | ORAL_TABLET | Freq: Every day | ORAL | Status: DC

## 2024-03-30 NOTE — Hospital Course (Signed)
 33 y.o. male with medical history significant of chronic/recurrent pancreatitis, pancreatic pseudocyst, gastric pneumatosis, status post mechanical aortic valve replacement and aortoplasty, hypertension, alcohol use disorder, asthma, and SIBO sent by GI due to concern for GJ tube malfunction.    Assessment and Plan:   GJ tube malfunction - Sent from GI due to recurrent malfunction.  IR consulted for replacement.  Unfortunately unable to replace GJ tube. Placed G-tube in the interim.  Report from IR that possible replacement of port location for better anatomical positioning of J-tube perhaps on Wednesday/Friday. In the meantime, PICC ordered and TPN initiated.  Initiated heparin  drip bridge and DC warfarin for now in anticipation.    Chronic pancreatitis - Currently on tube feed for nutrition at home however unable to last few days.  Follows closely with GI (they are in the loop via secure chat).     Status post mechanical aortic valve - Currently on heparin  bridge.  Warfarin on hold.  Monitor INR daily.   Goals of care - Goal to have GJ tube position redone by interventional radiology later this week.  Optimize INR.  Continue on TPN for now.  If tube cannot be replaced, anticipate patient to return home with PICC and TPN.   Acute right shoulder pain - Patient complaining of right shoulder pain however etiology unclear.  No injury or strain that he can remember.  Describes as deep sharp pain.  Will order imaging.  Prescribed IV Toradol  x 1 and topical Voltaren gel.

## 2024-03-30 NOTE — Progress Notes (Signed)
 Progress Note   Patient: [name] [name] FMW:[id number] DOB: [birth year] DOA: 03/28/2024  DOS: the patient was seen and examined on 03/30/2024   Brief hospital course:  33 y.o. male with medical history significant of chronic/recurrent pancreatitis, pancreatic pseudocyst, gastric pneumatosis, status post mechanical aortic valve replacement and aortoplasty, hypertension, alcohol use disorder, asthma, and SIBO sent by GI due to concern for GJ tube malfunction.   Assessment and Plan:  GJ tube malfunction - Sent from GI due to recurrent malfunction.  IR consulted for replacement this morning.  Once replaced will work with dietary to ensure proper function.  Anticipate discharge shortly after.  Chronic pancreatitis - Currently on tube feed for nutrition.  Follows closely with GI.  Considerations for Whipple procedure in the future possibly.  Status post mechanical aortic valve - Warfarin, aspirin  on board.  INR at goal.  Goals of care - Anticipate discharge later today   Subjective: Evaluated patient this morning.  Complains of persistent abdominal pain but currently no fever, shortness of breath, nausea, vomiting.  Does not take anything by mouth.  Relayed that this GJ tube had been malfunction, resulting in him vomiting up tube feeds.  Physical Exam:  Vitals:   03/29/24 1622 03/29/24 1709 03/29/24 2100 03/30/24 0757  BP: 120/77 126/77 132/84 130/77  Pulse: 67 64 76 (!) 59  Resp: 15 18  16   Temp: 98.2 F (36.8 C) 98 F (36.7 C) 98.2 F (36.8 C) 98.1 F (36.7 C)  TempSrc: Oral Oral Oral Oral  SpO2: 100% 100% 100% 99%  Weight:      Height:        GENERAL:  Alert, pleasant, no acute distress, frail HEENT:  EOMI CARDIOVASCULAR:  RRR, no murmurs appreciated RESPIRATORY:  Clear to auscultation, no wheezing, rales, or rhonchi GASTROINTESTINAL:  Soft, mildly diffusely tender, nondistended EXTREMITIES:  No LE edema bilaterally NEURO:  No new focal deficits  appreciated SKIN:  No rashes noted PSYCH:  Appropriate mood and affect     Data Reviewed:  Imaging Studies: CT ABDOMEN PELVIS W CONTRAST Result Date: 03/28/2024 CLINICAL DATA:  Abdominal pain, acute, nonlocalized. Concern for GJ tube out of place. EXAM: CT ABDOMEN AND PELVIS WITH CONTRAST TECHNIQUE: Multidetector CT imaging of the abdomen and pelvis was performed using the standard protocol following bolus administration of intravenous contrast. RADIATION DOSE REDUCTION: This exam was performed according to the departmental dose-optimization program which includes automated exposure control, adjustment of the mA and/or kV according to patient size and/or use of iterative reconstruction technique. CONTRAST:  75mL OMNIPAQUE  IOHEXOL  350 MG/ML SOLN COMPARISON:  03/04/2024, 03/27/2024. FINDINGS: Lower chest: No acute abnormality. Hepatobiliary: Hypodense region is noted in the left lobe of the liver adjacent to the falciform ligament suggesting focal fatty infiltration. The gallbladder is without stones. Intrahepatic biliary ductal dilatation. The common bile duct is normal in caliber. Pancreas: Coarse calcifications are noted in the pancreas, most pronounced at the head suggesting chronic pancreatitis. There is pancreatic ductal dilatation measuring at up to 1 cm in the tail, not significantly changed from the previous exam. Mild edema and fat stranding is unchanged. Loculated fluid collection at the head of the pancreas is not well delineated, however appears smaller as compared with the previous exam. Spleen: A small hypodense region is noted in the posterior aspect of the spleen with surrounding hypervascularity, query hemangioma. Adrenals/Urinary Tract: The adrenal glands are within normal limits. The kidneys enhance symmetrically. No renal calculus or hydronephrosis bilaterally. The bladder is unremarkable. Stomach/Bowel: A GJ  tube loops in the stomach and terminates in the proximal duodenum. There is  thickening of the walls of the duodenum and gastric antrum. No bowel obstruction, free air, or pneumatosis. Appendix appears normal. Vascular/Lymphatic: There is focal narrowing of the confluence of the portal vein, splenic vein, and superior mesenteric vein. No obvious filling defect is seen. Enlarged lymph nodes are noted about the pancreatic head and proximal duodenum, likely reactive. Reproductive: Prostate is unremarkable. Other: No abdominopelvic ascites. Musculoskeletal: No acute osseous abnormality. Sternotomy wires are present over the midline. IMPRESSION: 1. GJ tube loops in the stomach and terminates in the proximal duodenum. 2. Stable findings of acute on chronic pancreatitis with associated gastritis and duodenitis. 3. Loculated fluid collection adjacent to the head of the pancreas is not well delineated, however appears smaller as compared with the previous exam. 4. 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 Electronically Signed   By: Leita Birmingham M.D.   On: 03/28/2024 18:48   MR ABDOMEN MRCP W WO CONTAST Result Date: 03/27/2024 CLINICAL DATA:  elevated labs/?partial biliary obstruction; acute on chronic pancreatitis. EXAM: MRI ABDOMEN WITHOUT AND WITH CONTRAST (INCLUDING MRCP) TECHNIQUE: Multiplanar multisequence MR imaging of the abdomen was performed both before and after the administration of intravenous contrast. Heavily T2-weighted images of the biliary and pancreatic ducts were obtained, and three-dimensional MRCP images were rendered by post processing. CONTRAST:  6mL GADAVIST  GADOBUTROL  1 MMOL/ML IV SOLN COMPARISON:  CT scan abdomen and pelvis from 03/04/2024 and MRI abdomen from 01/23/2024. FINDINGS: Lower chest: Unremarkable MR appearance to the lung bases. No pleural effusion. No pericardial effusion. Normal heart size. Hepatobiliary: The liver is normal in size. Noncirrhotic configuration. No focal liver lesion. There  is new mild central intrahepatic biliary ductal dilation. There is also moderate extrahepatic bile duct dilation. The extrahepatic bile duct measures up to 15 mm in the proximal portion and then gradually narrows in the midportion with smooth walls. At the junction of upper 2/3 and lower 1/3 it measures up to 3.5-4 mm and in the distal portion it measures up to 1-2 mm. No discrete obstructing mass or choledocholithiasis seen. 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 Physiologically distended gallbladder. No abnormal wall thickening or pericholecystic fat stranding. No cholelithiasis. Pancreas: Heterogeneous signal intensity of the pancreas. There is mild T1 hyperintense signal in the distal pancreatic body and tail region; however, the proximal pancreas exhibit diffuse interstitial edema with fuzzy margins and peripancreatic fat stranding. There is heterogeneity and slightly comparative less enhancement of the pancreatic head/uncinate process and proximal body in relation to the rest of the distal pancreas. There is also marked dilation of the main pancreatic duct which measures up to 10-10.5 mm in the pancreatic body region. There are innumerable dilated/prominent pancreatic side branches. The main pancreatic duct gradually tapers before the ampulla of Vater. There are several peripancreatic fluid collections with largest along the anterior aspect of the head measuring up to 1.3 x 2.8 cm (series 6, image 26). The collection exhibits smooth enhancing walls. Overall, the collection has significantly decreased in size since the prior MRI from 01/23/2024. Spleen: Top-normal in size. Normal signal intensity. No focal lesion. Adrenals/Urinary Tract: Unremarkable adrenal glands. No hydroureteronephrosis. No suspicious renal mass. Stomach/Bowel: There is a at least 3.3 x 4.6 cm diverticulum arising from the fundus of the stomach. Percutaneous gastrojejunostomy tube is  seen. Visualized portions within the abdomen are unremarkable. No  disproportionate dilation of bowel loops. Vascular/Lymphatic: No pathologically enlarged lymph nodes identified. No abdominal aortic aneurysm demonstrated. No ascites. Other:  None. Musculoskeletal: No suspicious bone lesions identified. IMPRESSION: 1. 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 No discrete obstructing mass or choledocholithiasis seen. There is also marked dilation of the main pancreatic duct which gradually tapers before the Ampulla of Vater. There are innumerable dilated/prominent pancreatic side branches. There are several peripancreatic fluid collections with largest along the anterior aspect of the head measuring up to 1.3 x 2.8 cm. Overall, the collection has significantly decreased in size since the prior MRI from 01/23/2024. 2. There is diffuse interstitial edema of the pancreas with fuzzy margins and peripancreatic fat stranding. There is heterogeneity and slightly comparative less enhancement of the pancreatic head/uncinate process and proximal body in relation to the rest of the distal pancreas. Findings favor acute on chronic pancreatitis. 3. There is a 3.3 x 4.6 cm diverticulum arising from the fundus of the stomach. 4. Multiple other nonacute observations, as described above. Electronically Signed   By: Ree Molt M.D.   On: 03/27/2024 08:31   MR 3D Recon At Scanner Result Date: 03/27/2024 CLINICAL DATA:  elevated labs/?partial biliary obstruction; acute on chronic pancreatitis. EXAM: MRI ABDOMEN WITHOUT AND WITH CONTRAST (INCLUDING MRCP) TECHNIQUE: Multiplanar multisequence MR imaging of the abdomen was performed both before and after the administration of intravenous contrast. Heavily T2-weighted images of the biliary and pancreatic ducts were obtained, and three-dimensional MRCP images were rendered by post processing. CONTRAST:  6mL GADAVIST  GADOBUTROL  1 MMOL/ML  IV SOLN COMPARISON:  CT scan abdomen and pelvis from 03/04/2024 and MRI abdomen from 01/23/2024. FINDINGS: Lower chest: Unremarkable MR appearance to the lung bases. No pleural effusion. No pericardial effusion. Normal heart size. Hepatobiliary: The liver is normal in size. Noncirrhotic configuration. No focal liver lesion. There is new mild central intrahepatic biliary ductal dilation. There is also moderate extrahepatic bile duct dilation. The extrahepatic bile duct measures up to 15 mm in the proximal portion and then gradually narrows in the midportion with smooth walls. At the junction of upper 2/3 and lower 1/3 it measures up to 3.5-4 mm and in the distal portion it measures up to 1-2 mm. No discrete obstructing mass or choledocholithiasis seen. 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 Physiologically distended gallbladder. No abnormal wall thickening or pericholecystic fat stranding. No cholelithiasis. Pancreas: Heterogeneous signal intensity of the pancreas. There is mild T1 hyperintense signal in the distal pancreatic body and tail region; however, the proximal pancreas exhibit diffuse interstitial edema with fuzzy margins and peripancreatic fat stranding. There is heterogeneity and slightly comparative less enhancement of the pancreatic head/uncinate process and proximal body in relation to the rest of the distal pancreas. There is also marked dilation of the main pancreatic duct which measures up to 10-10.5 mm in the pancreatic body region. There are innumerable dilated/prominent pancreatic side branches. The main pancreatic duct gradually tapers before the ampulla of Vater. There are several peripancreatic fluid collections with largest along the anterior aspect of the head measuring up to 1.3 x 2.8 cm (series 6, image 26). The collection exhibits smooth enhancing walls. Overall, the collection has significantly decreased in size since the prior MRI from  01/23/2024. Spleen: Top-normal in size. Normal signal intensity. No focal lesion. Adrenals/Urinary Tract: Unremarkable adrenal glands. No hydroureteronephrosis. No suspicious renal mass. Stomach/Bowel: There is a at least 3.3 x 4.6 cm  diverticulum arising from the fundus of the stomach. Percutaneous gastrojejunostomy tube is seen. Visualized portions within the abdomen are unremarkable. No disproportionate dilation of bowel loops. Vascular/Lymphatic: No pathologically enlarged lymph nodes identified. No abdominal aortic aneurysm demonstrated. No ascites. Other:  None. Musculoskeletal: No suspicious bone lesions identified. IMPRESSION: 1. 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 No discrete obstructing mass or choledocholithiasis seen. There is also marked dilation of the main pancreatic duct which gradually tapers before the Ampulla of Vater. There are innumerable dilated/prominent pancreatic side branches. There are several peripancreatic fluid collections with largest along the anterior aspect of the head measuring up to 1.3 x 2.8 cm. Overall, the collection has significantly decreased in size since the prior MRI from 01/23/2024. 2. There is diffuse interstitial edema of the pancreas with fuzzy margins and peripancreatic fat stranding. There is heterogeneity and slightly comparative less enhancement of the pancreatic head/uncinate process and proximal body in relation to the rest of the distal pancreas. Findings favor acute on chronic pancreatitis. 3. There is a 3.3 x 4.6 cm diverticulum arising from the fundus of the stomach. 4. Multiple other nonacute observations, as described above. Electronically Signed   By: Ree Molt M.D.   On: 03/27/2024 08:31   IR GJ Tube Change Result Date: 03/19/2024 INDICATION: 33 year old male with history of pancreatitis status post percutaneous gastrojejunostomy tube placed on 02/13/2024 presenting with tube malfunction.  EXAM: FLUOROSCOPIC GUIDED REPLACEMENT OF GASTROJEJUNOSTOMY TUBE COMPARISON:  02/13/2024 MEDICATIONS: None. CONTRAST:  25mL OMNIPAQUE  IOHEXOL  300 MG/ML SOLN, 25mL OMNIPAQUE  IOHEXOL  300 MG/ML SOLN, 25mL OMNIPAQUE  IOHEXOL  300 MG/ML SOLN FLUOROSCOPY TIME:  Seventy-eight mGy reference air kerma COMPLICATIONS: None. PROCEDURE: Informed written consent was obtained from the patient after a discussion of the risks and benefits. The upper abdomen and the external portion of the existing gastrojejunostomy tube was prepped and draped in the usual sterile fashion, and a sterile drape was applied covering the operative field. Maximum barrier sterile technique with sterile gowns and gloves were used for the procedure. A timeout was performed prior to the initiation of the procedure. Preprocedure radiograph demonstrated coiled position of the jejunal arm within the body of the stomach. The external portion of the catheter was cut to release the retention balloon. A stiff Glidewire was inserted via the gastrostomy port and coiled within the gastric lumen. A 10 French angled sheath was then placed over the wire and used to direct the wire into the duodenum with moderate difficulty. The wire was advanced with the assistance of a Kumpe the catheter coaxially to the proximal jejunum. The catheter and sheath were removed and a new, 18 French balloon retention gastrojejunostomy tube was placed. The catheter tip was unable to be advanced beyond the second portion of the duodenum due to redundancy of the jejunal limb within the stomach. Balloon was inflated with 10 mL of sterile water . Contrast injection via the jejunostomy and gastric lumens confirmed appropriate functioning and positioning. A dressing was placed. The patient tolerated procedure well without immediate postprocedural complication. IMPRESSION: Successful fluoroscopic guided replacement of malpositioned 18 French gastrojejunostomy. The tip of the jejunostomy lumen lies  within the second portion of the duodenum. Both lumens ready for immediate use. Ester Sides, MD [facility] Electronically Signed   By: Ester Sides M.D.   On: 03/19/2024 20:56   DG ERCP Result Date: 03/09/2024 CLINICAL DATA:  Chronic recurrent pancreatitis and pancreatic duct dilatation on MRCP EXAM: ERCP 14 fluoroscopic  intra procedural views of the right upper quadrant submitted for interpretation TECHNIQUE: Multiple spot images obtained with the fluoroscopic device and submitted for interpretation post-procedure. FLUOROSCOPY: Radiation Exposure Index (as provided by the fluoroscopic device): 32.09 mGy Kerma COMPARISON:  None Available. FINDINGS: Images demonstrate what appears to be percutaneous enteric tube with retention balloon. Flexible endoscopy device present with minimal contrast injection and no definite advancement into the common bile duct. IMPRESSION: Images of an attempted common bile duct access with minimal contrast injection. These images were submitted for radiologic interpretation only. Please see the procedural report for the amount of contrast and the fluoroscopy time utilized. Electronically Signed   By: Cordella Banner   On: 03/09/2024 16:09   DG C-Arm 1-60 Min-No Report Result Date: 03/09/2024 Fluoroscopy was utilized by the requesting physician.  No radiographic interpretation.   CT ABDOMEN PELVIS W CONTRAST Result Date: 03/04/2024 EXAM: CT ABDOMEN AND PELVIS WITH CONTRAST 03/04/2024 12:59:40 AM TECHNIQUE: CT of the abdomen and pelvis was performed with the administration of intravenous contrast. Multiplanar reformatted images are provided for review. Automated exposure control, iterative reconstruction, and/or weight-based adjustment of the mA/kV was utilized to reduce the radiation dose to as low as reasonably achievable. 50mL of iohexol  (OMNIPAQUE ) 350 MG/ML injection was used. COMPARISON: None available. CLINICAL  HISTORY: Pancreatitis, acute, severe. FINDINGS: LOWER CHEST: No acute abnormality. LIVER: The liver is unremarkable. GALLBLADDER AND BILE DUCTS: Layering hyperdense material within the gallbladder lumen may represent inspissated bile and/or sludge. No superimposed pericholecystic inflammatory change. PANCREAS: Extensive parenchymal calcifications are seen throughout the head and body of the pancreas in keeping with changes of chronic pancreatitis. The main pancreatic duct is dilated measuring up to 12 mm in diameter within the mid body appears centrally constricted by dense calcification within the pancreatic head. There are extensive peripancreatic inflammatory changes within the pancreatic or duodenal groove and adjacent to the distal body of the stomach. These may relate to changes of acute on chronic pancreatitis or severe gastritis/duodenitis. A loculated fluid collection extending from the head of the pancreas demonstrates a tortuous, lobulated coarse appearance, relatively stable in size since prior examination, measuring 1.5 x 3.5 cm. Two additional poorly circumscribed hyperdense fluid collections are seen adjacent to the Gastric Antrum and first portion of the duodenum, in keeping with peri-duodenal hematoma, measuring up to 2.2 x 4.9 cm and 1.2 x 2.6 cm. STOMACH AND BOWEL: A balloon retention gastrojejunostomy is again identified, however, the jejunal limb of the catheter is now seen looping within the stomach. Extensive circumferential wall thickening and hyperemia involve the distal body of the stomach, Gastric Antrum, and the first portion of the duodenum with obliteration of the enteric lumen in this region. PERITONEUM AND RETROPERITONEUM: Small simple-appearing free fluid is now seen within the pelvis, new since prior examination. No free intraperitoneal gas. Inflammatory changes extend from the duodenal region into the greater omentum surrounding the transverse colon. VASCULATURE: There is marked  narrowing of the superior mesenteric vein and central splenic vein at the confluence with the main portal vein, however, these vessels remain patent. LYMPH NODES: No lymphadenopathy. BONES AND SOFT TISSUES: No acute bone abnormality. IMPRESSION: 1. Extensive parenchymal calcifications in the pancreatic head and body, dilated main pancreatic duct (up to 12 mm) with central constriction by dense calcification, and extensive peripancreatic inflammatory changes, consistent with acute on chronic pancreatitis. 2. Loculated fluid collection (1.5 x 3.5 cm) extending from the pancreatic head, relatively stable in size since prior examination. 3. Two additional early circumscribed hyperdense  fluid collections adjacent to the gastric antrum and first portion of the duodenum, consistent with peri-duodenal hematoma, measuring up to 2.2 x 4.9 cm and 1.2 x 2.6 cm. 4. Extensive circumferential wall thickening and hyperemia involving the distal body of the stomach, gastric antrum, and first portion of the duodenum with obliteration of the enteric lumen in this region, consistent with severe gastritis/duodenitis. 5. 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 6. 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 7. Small simple-appearing free fluid within the pelvis, new since prior examination. Electronically signed by: Dorethia Molt MD 03/04/2024 01:18 AM EDT RP Workstation: [id number]    There are no new results to review at this time.  Previous records (including but not limited to H&P, progress notes, nursing notes, TOC management) were reviewed in assessment of this patient.  Labs: CBC: Recent Labs  Lab 03/25/24 1034 03/28/24 1620 03/29/24 0844 03/30/24 0900  WBC 5.0 9.1 5.9 5.4  NEUTROABS 2.6  --   --   --   HGB 11.9* 10.0* 8.7* 8.7*  HCT  36.7* 31.2* 27.1* 27.2*  MCV 84.8 88.6 88.6 88.3  PLT 445.0* 444* 309 311   Basic Metabolic Panel: Recent Labs  Lab 03/25/24 1034 03/28/24 1620 03/29/24 0844 03/30/24 0900  NA 136 136  --  141  K 3.9 3.6  --  3.7  CL 95* 97*  --  106  CO2 30 29  --  26  GLUCOSE 110* 103*  --  104*  BUN 9 6  --  6  CREATININE 0.72 0.75 0.62 0.66  CALCIUM  9.8 8.9  --  8.5*   Liver Function Tests: Recent Labs  Lab 03/25/24 1034 03/28/24 1620  AST 115* 65*  ALT 174* 98*  ALKPHOS 1300 Repeated and verified X2.* 628*  BILITOT 1.9* 0.8  PROT 8.3 7.1  ALBUMIN  4.4 3.4*   CBG: No results for input(s): GLUCAP in the last 168 hours.  Scheduled Meds:  aspirin  EC  81 mg Oral Daily   [START ON 03/31/2024] feeding supplement (PROSource TF20)  60 mL Per Tube Daily   lipase/protease/amylase  72,000 Units Oral TID AC   metoprolol  succinate  12.5 mg Oral QHS   [START ON 03/31/2024] multivitamin with minerals  1 tablet Per Tube Daily   pantoprazole  (PROTONIX ) IV  40 mg Intravenous Q24H   [START ON 03/31/2024] thiamine  100 mg Per Tube Daily   warfarin  3 mg Oral ONCE-1600   Warfarin - Pharmacist Dosing Inpatient   Does not apply q1600   Continuous Infusions:  sodium chloride  125 mL/hr at 03/29/24 2322   feeding supplement (OSMOLITE 1.5 CAL)     PRN Meds:.HYDROmorphone  (DILAUDID ) injection, hydrOXYzine , methocarbamol , ondansetron  (ZOFRAN ) IV  Family Communication: None at bedside  Disposition: Status is: Observation The patient remains OBS appropriate and will d/c before 2 midnights.     Time spent: 31 minutes  Length of inpatient stay: 0 days  Author: Carliss LELON Canales, DO 03/30/2024 1:56 PM  For on call review [email or web address].

## 2024-03-30 NOTE — Progress Notes (Signed)
  Progress Note   Patient: [name] [name] FMW:[id number] DOB: 1990-10-17 DOA: 03/28/2024  DOS: the patient was seen and examined on 03/30/2024  CROSS COVER:  Interventional radiology unable to replace GJ tube.  Placed G-tube in the interim.  Given patient is unable to restart tube feeds and has not been able to eat many days prior to hospitalization, will empirically initiate TPN.  Will place order for PICC line, TPN from pharmacy.  Report from IR that possible replacement of port location for better anatomical positioning of J-tube perhaps on Wednesday/Friday.  In the meantime we will place patient on heparin  drip bridge and DC warfarin for now in anticipation.  Had discussion with the plan for the patient and he is in agreement.  Is willing to stay to have this resolved.  Patient asked me to update his GI team as well.  For now we will initiate D5 half-normal with K, await PICC placement and TPN.  Will also initiate clear liquid diet per patient request.   Author: Carliss LELON Canales, DO 03/30/2024 3:08 PM  For on call review [email or web address].

## 2024-03-30 NOTE — Progress Notes (Addendum)
@   1400 Patient back in room from IR. Patient requested to see MD as cannot have feeding with G tube, as he needs a J tube.   Abdominal pain verbalized and addressed per orders.   MD notified of request and did come to the bedside. New orders received.   Plan of care continues.

## 2024-03-30 NOTE — Telephone Encounter (Signed)
 Patient requesting call regarding an update of FMLA paperwork. Please advise, thank you

## 2024-03-30 NOTE — Telephone Encounter (Signed)
 Called patient to clarify what exactly he needs for Dr. Albertus to fill out. Patient states his job did not give him FMLA paperwork for the provider to fill out. He only gave us  the request for FMLA that the patient filled out. Patient states he needs a letter typed stating that he will be out of work due to acute illness for rest of the year. Dr. Albertus is aware and will type letter. Patient states the start date can be a week ago.

## 2024-03-30 NOTE — Progress Notes (Signed)
 PHARMACY - ANTICOAGULATION CONSULT NOTE  Pharmacy Consult for Heparin  (dc warfarin/lovenox ) Indication: mechanical aortic valve (On-X)  Allergies  Allergen Reactions   Amoxicillin Hives    Patient Measurements: Height: 5' 11 (180.3 cm) Weight: 63.5 kg (140 lb) IBW/kg (Calculated) : 75.3 HEPARIN  DW (KG): 63.5  Vital Signs: Temp: 98.1 F (36.7 C) (10/20 0757) Temp Source: Oral (10/20 0757) BP: 130/77 (10/20 0757) Pulse Rate: 59 (10/20 0757)  Labs: Recent Labs    03/28/24 1620 03/29/24 0107 03/29/24 0844 03/30/24 0900  HGB 10.0*  --  8.7* 8.7*  HCT 31.2*  --  27.1* 27.2*  PLT 444*  --  309 311  LABPROT  --  18.1*  --  22.6*  INR  --  1.4*  --  1.9*  CREATININE 0.75  --  0.62 0.66    Estimated Creatinine Clearance: 118 mL/min (by C-G formula based on SCr of 0.66 mg/dL).   Medical History: Past Medical History:  Diagnosis Date   Abnormal finding on GI tract imaging 12/07/2023   Aortic valve calcification 06/08/2020   Asthma    Headache    migraines   Heart murmur    Hypokalemia 06/08/2020   Leukocytosis 01/23/2022   Mild aortic stenosis    Pancreatitis    Pneumonia    as a child x several   Substance abuse (HCC)    Subtherapeutic international normalized ratio (INR) 01/23/2022   Transaminitis 06/08/2020    Assessment: 33 yo M with mechanical aortic On-X valve. Pharmacy consulted for warfarin management while pt is inpatient.  INR 1.9 (10/20) - therapeutic   Home regimen: has warfarin 3mg  tablets - 1.5 tabs (4.5mg ) daily except 2 tabs (6 mg) on Mon, Wed  PM: to transition AC to heparin  drip while warfarin being held for procedure later this week. Last dose of lovenox  ~8a today.  Goal of Therapy:  INR 1.5-2 Monitor platelets by anticoagulation protocol: Yes   Plan:  At 23:00 tonight, begin IV heparin  drip 850 units/hr (no bolus) Check heparin  level 6hr afterwards Daily heparin  level, INR, CBC  F/u timing of procedure and if heparin  needs to be  held prior  Rocky Slade, PharmD, BCPS 03/30/2024,3:17 PM

## 2024-03-30 NOTE — Progress Notes (Addendum)
 PHARMACY - ANTICOAGULATION CONSULT NOTE  Pharmacy Consult for warfarin Indication: mechanical aortic valve (On-X)  Allergies  Allergen Reactions   Amoxicillin Hives    Patient Measurements: Height: 5' 11 (180.3 cm) Weight: 63.5 kg (140 lb) IBW/kg (Calculated) : 75.3 HEPARIN  DW (KG): 63.5  Vital Signs: Temp: 98.1 F (36.7 C) (10/20 0757) Temp Source: Oral (10/20 0757) BP: 130/77 (10/20 0757) Pulse Rate: 59 (10/20 0757)  Labs: Recent Labs    03/28/24 1620 03/29/24 0107 03/29/24 0844 03/30/24 0900  HGB 10.0*  --  8.7* 8.7*  HCT 31.2*  --  27.1* 27.2*  PLT 444*  --  309 311  LABPROT  --  18.1*  --  22.6*  INR  --  1.4*  --  1.9*  CREATININE 0.75  --  0.62 0.66    Estimated Creatinine Clearance: 118 mL/min (by C-G formula based on SCr of 0.66 mg/dL).   Medical History: Past Medical History:  Diagnosis Date   Abnormal finding on GI tract imaging 12/07/2023   Aortic valve calcification 06/08/2020   Asthma    Headache    migraines   Heart murmur    Hypokalemia 06/08/2020   Leukocytosis 01/23/2022   Mild aortic stenosis    Pancreatitis    Pneumonia    as a child x several   Substance abuse (HCC)    Subtherapeutic international normalized ratio (INR) 01/23/2022   Transaminitis 06/08/2020    Assessment: 33 yo M with mechanical aortic On-X valve. Pharmacy consulted for warfarin management while pt is inpatient.  INR 1.9 (10/20) - therapeutic   Home regimen: has warfarin 3mg  tablets - 1.5 tabs (4.5mg ) daily except 2 tabs (6 mg) on Mon, Wed  Goal of Therapy:  INR 1.5-2 Monitor platelets by anticoagulation protocol: Yes   Plan:  Warfarin 3 mg PO x 1 today. Possibly resume home regimen tomorrow pending INR trend.  Stop enoxaparin  since INR is therapeutic.  Daily INR, CBC  F/u dietary changes and DDIs F/u IR plans and evaluation -primary MD to notify pharmacy if pt unable to do PO medications  Rumaisa Schnetzer Student Pharmacist  03/30/2024,10:49 AM

## 2024-03-30 NOTE — Progress Notes (Signed)
Patient off the unit for IR.

## 2024-03-30 NOTE — Plan of Care (Signed)
   Problem: Education: Goal: Knowledge of General Education information will improve Description Including pain rating scale, medication(s)/side effects and non-pharmacologic comfort measures Outcome: Progressing

## 2024-03-30 NOTE — Telephone Encounter (Signed)
 [name] [name] have you seen the FMLA paperwork on this pt? He is calling for an update on the form.

## 2024-03-30 NOTE — Progress Notes (Addendum)
 Initial Nutrition Assessment  DOCUMENTATION CODES:   Severe malnutrition in context of chronic illness  INTERVENTION:  TPN per pharmacy: Estimated Nutritional Needs:  Kcal:  2300-2500 kcal Protein:  100-120 gm Fluid:  >2L/day Titrate rate of TPN slowly as pt at refeeding risk Monitor magnesium , potassium, and phosphorus daily for at least 3 days, MD to replete as needed, as pt is at risk for refeeding syndrome  MVI with minerals and Thiamine supplementation by po unless pt does not tolerate then, MVI through TPN and IV Thiamine  When GJ tube replaced and ok to use, recommend continuous tube feeds via J port of GJ of: Osmolite at 20 ml/hr and increase by 10 ml every 8 hours until goal of 70 ml/hr (1680 ml per day)  Provides 2520 kcal, 105 gm protein, 1280 ml free water  daily  Once pt establishes tolerance of continuous tube feeds recommend transitioning to pt's home regimen of: Osmolite at 105 ml/hr x 16 hours (1680 ml per day)  Provides 2520 kcal, 105 gm protein, 1280 ml free water  daily  Monitor bowel function May be a candidate for Reliazorb to help with absorption due to inefficiency of pancreatic enzymes from pancreatitis.  Wean TPN when/if enteral nutrition started   NUTRITION DIAGNOSIS:   Severe Malnutrition related to chronic illness (chronic pancreatitis) as evidenced by severe muscle depletion, severe fat depletion, energy intake < or equal to 75% for > or equal to 1 month, percent weight loss (has had 30 lb, 17.5% weight loss in 7 months).  GOAL:   Patient will meet greater than or equal to 90% of their needs, Weight gain   MONITOR:   PO intake, TF tolerance, Diet advancement, I & O's, Labs  REASON FOR ASSESSMENT:   Consult Enteral/tube feeding initiation and management  ASSESSMENT:  33 y.o Male with PMH of chronic/recurrent pancreatitis, pancreatic pseudocyst, gastric pneumatosis, status post mechanical aortic valve replacement and aortoplasty, HTN, alcohol  use disorder, asthma, and SIBO sent by GI due to concern for GJ tube malfunction.  9/4 - GJ placed  10/9 - GJ tube malfunction s/p GJ tube changed  10/19 - CT showing GJ tube loops in the stomach and terminates in the proximal duodenum.  10/20 - IR: Attempts were made to reduce the loop in the stomach while maintaining access in the duodenum however this was unsuccessful. Duodenal access was lost   Pt has had GJ tube misplaced 3 times since initial placement on 02/13/2024. Pt reports not having consistent tube feeds because of this. Pt reports he is only able to tolerate a CLD, mostly just sorbet and hard candy due to his pancreatitis. UBW of 170 lbs in March of this year, has had 30 lb, 17.5% weight loss in 7 months and has been feeling overall weakness and decrease in energy. Has severe muscle and fat wasting.   Pt has been using his J port of his GJ tube for his tube feeds and sometimes crushed medications. Advised pt to avoid putting crushed medicines down his J tube, only liquid meds allowed through the J tube as they are easily clogged. Sometimes takes medicines by mouth if he can tolerate. No issues with drinking water  per pt. Takes creon  with meals at home, but not taking recently as he has had very limited po intake. Does have history of ETOH abuse, reports last drink was 2 days PTA and he has cut down on his consumption.   Recently pt reports constant abdominal pain and nausea which has caused  him not to start his tube feeds at night. Pt states he has only started his tube feeds once or twice over the last week. Follows a nocturnal tube feeding Regimen of 16 hours. Pt experienced vomiting the morning PTA  and noticed a feeling of fullness in the stomach upon waking, which was unusual given that the patient has a gastrojejunostomy (GJ) tube.   Plan for IR to fix GJ tube malfunction today. Unfortunately duodenal access was lost during this procedure. Plan to start TPN tomorrow and have GJ tube  replaced Wednesday. Once GJ tube fixed and desire for tube feeds to be started, will trial tolerance of enteral nutrition with continuous tube feeds of Osmolite 1.5 (Abbott equivalent of Nutren 1.5) and then transition to pt's home tube feeding regimen of nocturnal tube feeds x 16 hours.  Plan may also be to send pt home on TPN per MD.   Per MD, the patient expressed an interest in surgical options, specifically mentioning desire for a Whipple procedure due to insufficient nutrition intake and the impact on quality of life. The patient was hoping to expedite surgical intervention.   May be a candidate for Reliazorb with tube feeds to help with absorption due to inefficiency of pancreatic enzymes from pancreatitis.   Home tube feeding regimen via J port of GJ tube: Nutren 1.5 @105  ml x 16 hours via pump (1680 ml per day) 30-60 ml FWF before and after tube feeds No additional FWF  Provides: 2520 kcal, 114 gm protein, 1283 ml water  daily  Admit weight: 63.5 kg  Current weight: 63.4 kg  Wt Readings from Last 10 Encounters:  03/28/24 63.5 kg  03/25/24 63.5 kg  03/09/24 65.8 kg  03/03/24 68 kg  03/02/24 68.1 kg  02/17/24 66.3 kg  12/24/23 71.2 kg  12/07/23 70.3 kg  12/02/23 69.9 kg  09/09/23 77.4 kg   Average Meal Intake: NPO  Nutritionally Relevant Medications: Scheduled Meds:  [START ON 03/31/2024] feeding supplement (PROSource TF20)  60 mL Per Tube Daily   lipase/protease/amylase  72,000 Units Oral TID AC   [START ON 03/31/2024] multivitamin with minerals  1 tablet Oral Daily   [START ON 03/31/2024] thiamine  100 mg Oral Daily   Continuous Infusions:  sodium chloride  125 mL/hr at 03/29/24 2322   feeding supplement (OSMOLITE 1.5 CAL)     Labs Reviewed: AP 628 AST 65 ALT 98 CBG ranges from 103-110 mg/dL over the last 24 hours HgbA1c 4.8   NUTRITION - FOCUSED PHYSICAL EXAM:  Flowsheet Row Most Recent Value  Orbital Region Severe depletion  Upper Arm Region Severe  depletion  Thoracic and Lumbar Region Severe depletion  Buccal Region Severe depletion  Temple Region Severe depletion  Clavicle Bone Region Severe depletion  Clavicle and Acromion Bone Region Severe depletion  Scapular Bone Region Severe depletion  Dorsal Hand Severe depletion  Patellar Region Severe depletion  Anterior Thigh Region Severe depletion  Posterior Calf Region Severe depletion  Edema (RD Assessment) None  Hair Reviewed  Eyes Reviewed  Mouth Reviewed  Skin Reviewed  Nails Reviewed    Diet Order:   Diet Order             Diet NPO time specified  Diet effective now                   EDUCATION NEEDS:   Education needs have been addressed  Skin:  Skin Assessment: Reviewed RN Assessment  Last BM:  PTA  Height:   Ht  Readings from Last 1 Encounters:  03/28/24 5' 11 (1.803 m)    Weight:   Wt Readings from Last 1 Encounters:  03/28/24 63.5 kg    Ideal Body Weight:  78.2 kg  BMI:  Body mass index is 19.53 kg/m.  Estimated Nutritional Needs:   Kcal:  2300-2500 kcal  Protein:  100-120 gm  Fluid:  >2L/day   Olivia Kenning, RD Registered Dietitian  See Amion for more information

## 2024-03-31 ENCOUNTER — Inpatient Hospital Stay (HOSPITAL_COMMUNITY)

## 2024-03-31 ENCOUNTER — Encounter (HOSPITAL_COMMUNITY): Payer: Self-pay | Admitting: Hospitalist

## 2024-03-31 DIAGNOSIS — R1310 Dysphagia, unspecified: Secondary | ICD-10-CM | POA: Diagnosis not present

## 2024-03-31 DIAGNOSIS — K9423 Gastrostomy malfunction: Secondary | ICD-10-CM | POA: Diagnosis not present

## 2024-03-31 LAB — COMPREHENSIVE METABOLIC PANEL WITH GFR
ALT: 55 U/L — ABNORMAL HIGH (ref 0–44)
AST: 33 U/L (ref 15–41)
Albumin: 3 g/dL — ABNORMAL LOW (ref 3.5–5.0)
Alkaline Phosphatase: 426 U/L — ABNORMAL HIGH (ref 38–126)
Anion gap: 8 (ref 5–15)
BUN: <5 mg/dL — ABNORMAL LOW (ref 6–20)
CO2: 25 mmol/L (ref 22–32)
Calcium: 8.6 mg/dL — ABNORMAL LOW (ref 8.9–10.3)
Chloride: 105 mmol/L (ref 98–111)
Creatinine, Ser: 0.64 mg/dL (ref 0.61–1.24)
GFR, Estimated: >60 mL/min (ref >60)
Glucose, Bld: 103 mg/dL — ABNORMAL HIGH (ref 70–99)
Potassium: 4 mmol/L (ref 3.5–5.1)
Sodium: 138 mmol/L (ref 135–145)
Total Bilirubin: 0.9 mg/dL (ref 0.0–1.2)
Total Protein: 6.3 g/dL — ABNORMAL LOW (ref 6.5–8.1)

## 2024-03-31 LAB — MAGNESIUM: Magnesium: 2 mg/dL (ref 1.7–2.4)

## 2024-03-31 LAB — TRIGLYCERIDES: Triglycerides: 103 mg/dL (ref <150)

## 2024-03-31 LAB — CBC
HCT: 28.9 % — ABNORMAL LOW (ref 39.0–52.0)
Hemoglobin: 9.1 g/dL — ABNORMAL LOW (ref 13.0–17.0)
MCH: 27.7 pg (ref 26.0–34.0)
MCHC: 31.5 g/dL (ref 30.0–36.0)
MCV: 87.8 fL (ref 80.0–100.0)
Platelets: 346 K/uL (ref 150–400)
RBC: 3.29 MIL/uL — ABNORMAL LOW (ref 4.22–5.81)
RDW: 14.6 % (ref 11.5–15.5)
WBC: 8.3 K/uL (ref 4.0–10.5)
nRBC: 0 % (ref 0.0–0.2)

## 2024-03-31 LAB — PROTIME-INR
INR: 2.4 — ABNORMAL HIGH (ref 0.8–1.2)
Prothrombin Time: 27.2 s — ABNORMAL HIGH (ref 11.4–15.2)

## 2024-03-31 LAB — GLUCOSE, CAPILLARY
Glucose-Capillary: 100 mg/dL — ABNORMAL HIGH (ref 70–99)
Glucose-Capillary: 131 mg/dL — ABNORMAL HIGH (ref 70–99)

## 2024-03-31 LAB — PHOSPHORUS: Phosphorus: 3.9 mg/dL (ref 2.5–4.6)

## 2024-03-31 LAB — HEPARIN LEVEL (UNFRACTIONATED): Heparin Unfractionated: 0.18 [IU]/mL — ABNORMAL LOW (ref 0.30–0.70)

## 2024-03-31 MED ORDER — CHLORHEXIDINE GLUCONATE CLOTH 2 % EX PADS
6.0000 | MEDICATED_PAD | Freq: Every day | CUTANEOUS | Status: DC
  Administered 2024-03-31 – 2024-04-07 (×8): 6 via TOPICAL

## 2024-03-31 MED ORDER — INSULIN ASPART 100 UNIT/ML IJ SOLN
0.0000 [IU] | Freq: Four times a day (QID) | INTRAMUSCULAR | Status: DC
  Administered 2024-04-02 – 2024-04-03 (×4): 1 [IU] via SUBCUTANEOUS
  Administered 2024-04-03: 2 [IU] via SUBCUTANEOUS

## 2024-03-31 MED ORDER — SODIUM CHLORIDE 0.9% FLUSH
10.0000 mL | Freq: Two times a day (BID) | INTRAVENOUS | Status: DC
  Administered 2024-03-31 – 2024-04-06 (×11): 10 mL
  Administered 2024-04-07: 20 mL
  Administered 2024-04-07: 10 mL
  Administered 2024-04-08: 20 mL

## 2024-03-31 MED ORDER — SODIUM CHLORIDE 0.9% FLUSH: 10.0000 mL | INTRAVENOUS | Status: DC | PRN

## 2024-03-31 MED ORDER — TRAVASOL 10 % IV SOLN
INTRAVENOUS | Status: AC
  Filled 2024-03-31: qty 529.2

## 2024-03-31 MED ORDER — KETOROLAC TROMETHAMINE 15 MG/ML IJ SOLN
15.0000 mg | Freq: Once | INTRAMUSCULAR | Status: AC
  Administered 2024-03-31: 15 mg via INTRAVENOUS
  Filled 2024-03-31: qty 1

## 2024-03-31 MED ORDER — DICLOFENAC SODIUM 1 % EX GEL
2.0000 g | Freq: Four times a day (QID) | CUTANEOUS | Status: DC
  Administered 2024-03-31 – 2024-04-06 (×12): 2 g via TOPICAL
  Filled 2024-03-31: qty 100

## 2024-03-31 NOTE — Plan of Care (Signed)

## 2024-03-31 NOTE — Progress Notes (Addendum)
 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)  Allergies  Allergen Reactions   Amoxicillin Hives    Patient Measurements: Height: 5' 11 (180.3 cm) Weight: 63.5 kg (140 lb) IBW/kg (Calculated) : 75.3 HEPARIN  DW (KG): 63.5  Vital Signs: Temp: 98 F (36.7 C) (10/21 0454) Temp Source: Oral (10/21 0454) BP: 131/82 (10/21 0454) Pulse Rate: 58 (10/21 0454)  Labs: Recent Labs    03/29/24 0107 03/29/24 0844 03/30/24 0900 03/31/24 0423  HGB  --  8.7* 8.7* 9.1*  HCT  --  27.1* 27.2* 28.9*  PLT  --  309 311 346  LABPROT 18.1*  --  22.6* 27.2*  INR 1.4*  --  1.9* 2.4*  HEPARINUNFRC  --   --   --  0.18*  CREATININE  --  0.62 0.66 0.64    Estimated Creatinine Clearance: 118 mL/min (by C-G formula based on SCr of 0.64 mg/dL).   Assessment: 33 yo male with mechanical aortic On-X valve, Coumadin  on hold while awaiting GJ tube replacement, for heparin .  INR increased to 2.4 this morning.     Goal of Therapy:  INR 1.5-2 Monitor platelets by anticoagulation protocol: Yes   Plan:  Hold heparin  for now as INR is above therapeutic range for On-X valve, and restart once decreases F/U daily INR  Cathlyn Arrant, PharmD, BCPS   03/31/2024,5:29 AM

## 2024-03-31 NOTE — Progress Notes (Signed)
 PHARMACY - TOTAL PARENTERAL NUTRITION CONSULT NOTE   Indication: intolerance to enteral feeding  Patient Measurements: Height: 5' 11 (180.3 cm) Weight: 64.9 kg (143 lb 1.3 oz) IBW/kg (Calculated) : 75.3 TPN AdjBW (KG): 63.5 Body mass index is 19.96 kg/m.  Assessment:  33 year old male with a medical history significant for chronic pancreatitis, history of aortic valve replacement requiring chronic anticoagulation who was recently admitted on 01/11/2024 for recurrent pancreatitis/pseudocyst. Patient was started on TPN and ultimately had gastrojejunostomy placed for enteral feeds and was discharged home. Readmitted 10/20 for GJ tube malfunction. Interventional radiology unable to replace GJ tube. Patient is unable to restart tube feeds and has not been able to eat many days prior to hospitalization. Report from IR that possible replacement of port location for better anatomical positioning of J-tube perhaps on Wednesday/Friday. Pharmacy consulted to initiate TPN in the interim.   Glucose / Insulin : no hx DM, CBGs < 180; 5 units insulin  given in prior 24 hours Electrolytes: Na 138, K 4.0, Cl 105, CoCa 9.4, Phos 3.9, Mg 2.0 Renal: Scr < 1, BUN < 5 Hepatic: AST/ALT 33/55, Alb 3.0, Tbili 0.9, Alk Phos 426; TG 103 Intake / Output:  LBM 10/19, UOP not charted   GI Imaging 8/02 CT: severe sequelae of advanced pancreatitis 8/14 MRCP: acute pancreatitis, severe pancreatic ductal dilatation, concerning for ulceration to the bowel lumen 8/18 CT: gas density between the intraluminal gastric fluid and the wall of the stomach in the posterior and lateral portions of the fundus, concerning for pneumatosis 8/21 CT abd shows no evidence of gastric pneumatosis, stable multilocular cystic process anterior to the pancreatic head 8/28 DG abd shows nonobstructive bowel gas pattern   GI Surgeries / Procedures:  08/27 Cortrak placed 09/04 GJ placed  10/09 GJ tube malfunction s/p GJ tube changed  10/20  attempts with IR to reduce the loop in the stomach was unsuccessful, tube access lost   Central access: 8/20 - 8/29; (10/21 - pending) TPN start date: 8/20 - 8/29; 10/21 >>  Nutritional Goals: Goal TPN rate is 90 mL/hr (provides ~106 g of protein and ~2000 kcals per day)   RD Assessment: Estimated Needs Total Energy Estimated Needs: 2300-2500 kcal Total Protein Estimated Needs: 100-120 gm Total Fluid Estimated Needs: >2L/day  Current Nutrition:  10/20 NPO  Plan:  Start TPN at 45 mL/hr at 1800 to meet ~50% total nutritional needs Electrolytes in TPN: Na 50 mEq/L, K 50 mEq/L, Ca 5 mEq/L, Mg 5 mEq/L, and Phos 15 mmol/L. Cl:Ac 1:1 Add standard MVI and trace elements to TPN Add IV thiamine 100mg  to TPN for risk of refeeding syndrome Initiate Sensitive q6h SSI and adjust as needed  Reduce MIVF to 10 mL/hr at 1800 Monitor TPN labs on Mon/Thurs, daily PRN  Thank you for allowing pharmacy to be a part of this patient's care.  Shelba Collier, PharmD, BCPS Clinical Pharmacist

## 2024-03-31 NOTE — TOC CM/SW Note (Addendum)
 Transition of Care [facility]) - Inpatient Brief Assessment   Patient Details  Name: Taylor Raymond Rexford Newmann MRN: [id number] Date of Birth: [birth year]  [facility] of [facility]) CM/SW Contact:    Lauraine FORBES Saa, LCSWA Phone Number: 03/31/2024, 9:44 AM   Clinical Narrative:  9:45 AM Per chart review, patient resides at home alone. Patient has a PCP and insurance. Patient does not have SNF history. Patient has HH history with [facility] and [facility]. Patient has DME history with [facility]. Patient's preferred pharmacy is [facility] [phone] [facility]. Per progressions, patient is anticipated to discharge home with PICC. TOC will continue to follow.  Transition of Care Asessment: Insurance and Status: Insurance coverage has been reviewed Patient has primary care physician: Yes Home environment has been reviewed: Private Residence Prior level of function:: N/A Prior/Current Home Services: No current home services Social Drivers of Health Review: SDOH reviewed no interventions necessary Readmission risk has been reviewed: Yes (Currently Yellow 19%) Transition of care needs: transition of care needs identified, TOC will continue to follow

## 2024-03-31 NOTE — Progress Notes (Signed)
 Peripherally Inserted Central Catheter Placement  The IV Nurse has discussed with the patient and/or persons authorized to consent for the patient, the purpose of this procedure and the potential benefits and risks involved with this procedure.  The benefits include less needle sticks, lab draws from the catheter, and the patient may be discharged home with the catheter. Risks include, but not limited to, infection, bleeding, blood clot (thrombus formation), and puncture of an artery; nerve damage and irregular heartbeat and possibility to perform a PICC exchange if needed/ordered by physician.  Alternatives to this procedure were also discussed.  Bard Power PICC patient education guide, fact sheet on infection prevention and patient information card has been provided to patient /or left at bedside.    PICC Placement Documentation  PICC Double Lumen 03/31/24 Right Brachial 40 cm 0 cm (Active)  Indication for Insertion or Continuance of Line Administration of hyperosmolar/irritating solutions (i.e. TPN, Vancomycin , etc.) 03/31/24 1100  Exposed Catheter (cm) 0 cm 03/31/24 1100  Site Assessment Clean, Dry, Intact 03/31/24 1100  Lumen #1 Status Flushed;Saline locked;Blood return noted 03/31/24 1100  Lumen #2 Status Flushed;Saline locked;Blood return noted 03/31/24 1100  Dressing Type Transparent;Securing device 03/31/24 1100  Dressing Status Antimicrobial disc/dressing in place 03/31/24 1100  Line Care Connections checked and tightened 03/31/24 1100  Line Adjustment (NICU/IV Team Only) No 03/31/24 1100  Dressing Intervention New dressing;Adhesive placed at insertion site (IV team only) 03/31/24 1100  Dressing Change Due 04/07/24 03/31/24 1100       [name] [name] 03/31/2024, 11:15 AM

## 2024-03-31 NOTE — Care Management (Signed)
 Notified Pam w [facility] and Darleene with [facility] that we are following for possible home TPN.

## 2024-03-31 NOTE — Progress Notes (Signed)
 Progress Note   Patient: [name] [name] FMW:[id number] DOB: July 06, 1990 DOA: 03/28/2024  DOS: the patient was seen and examined on 03/31/2024   Brief hospital course:  33 y.o. male with medical history significant of chronic/recurrent pancreatitis, pancreatic pseudocyst, gastric pneumatosis, status post mechanical aortic valve replacement and aortoplasty, hypertension, alcohol use disorder, asthma, and SIBO sent by GI due to concern for GJ tube malfunction.    Assessment and Plan:   GJ tube malfunction - Sent from GI due to recurrent malfunction.  IR consulted for replacement.  Unfortunately unable to replace GJ tube. Placed G-tube in the interim.  Report from IR that possible replacement of port location for better anatomical positioning of J-tube perhaps on Wednesday/Friday. In the meantime, PICC ordered and TPN initiated.  Initiated heparin  drip bridge and DC warfarin for now in anticipation.    Chronic pancreatitis - Currently on tube feed for nutrition at home however unable to last few days.  Follows closely with GI (they are in the loop via secure chat).     Status post mechanical aortic valve - Currently on heparin  bridge.  Warfarin on hold.  Monitor INR daily.   Goals of care - Goal to have GJ tube position redone by interventional radiology later this week.  Optimize INR.  Continue on TPN for now.  If tube cannot be replaced, anticipate patient to return home with PICC and TPN.  Acute right shoulder pain - Patient complaining of right shoulder pain however etiology unclear.  No injury or strain that he can remember.  Describes as deep sharp pain.  Will order imaging.  Prescribed IV Toradol  x 1 and topical Voltaren gel.  Subjective: Patient resting, this morning complaining of right shoulder pain.  Denies any injury.  Currently on IV fluids.  Eager for PICC line placement and TPN.  Denies any shortness of breath, chest pain, fever, chills, nausea,  vomiting.  Physical Exam:  Vitals:   03/30/24 2023 03/31/24 0454 03/31/24 0500 03/31/24 0825  BP: 111/75 131/82  122/84  Pulse: 67 (!) 58  64  Resp: 18 18  18   Temp: 98.5 F (36.9 C) 98 F (36.7 C)  98.3 F (36.8 C)  TempSrc: Oral Oral  Oral  SpO2: 100% 100%  100%  Weight:   64.9 kg   Height:        GENERAL:  Alert, pleasant, no acute distress, frail HEENT:  EOMI CARDIOVASCULAR:  RRR, no murmurs appreciated RESPIRATORY:  Clear to auscultation, no wheezing, rales, or rhonchi GASTROINTESTINAL:  Soft, mildly diffusely tender, nondistended EXTREMITIES:  No LE edema bilaterally NEURO:  No new focal deficits appreciated SKIN:  No rashes noted PSYCH:  Appropriate mood and affect    Data Reviewed:  Imaging Studies: US  EKG SITE RITE Result Date: 03/30/2024 If Site Rite image not attached, placement could not be confirmed due to current cardiac rhythm.  IR GJ Tube Change Result Date: 03/30/2024 INDICATION: Malfunctioning gastrojejunostomy EXAM: FLUOROSCOPIC EXCHANGE FOR  GASTROSTOMY Date:  03/30/2024 03/30/2024 12:23 pm Radiologist:  M. Frederic Specking, MD Guidance:  FLUOROSCOPIC MEDICATIONS: NONE. ANESTHESIA/SEDATION: Moderate Sedation Time:  NONE. The patient was continuously monitored during the procedure by the interventional radiology nurse under my direct supervision. CONTRAST:  30mL OMNIPAQUE  IOHEXOL  300 MG/ML SOLN - administered into the gastric lumen. FLUOROSCOPY: Fluoroscopy Time: 10 minutes 5 seconds (31 mGy). COMPLICATIONS: None immediate. PROCEDURE: Informed consent was obtained from the patient following explanation of the procedure, risks, benefits and alternatives. The patient understands, agrees and consents for  the procedure. All questions were addressed. A time out was performed. Existing malpositioned GJ feeding tube was injected with contrast under fluoroscopy. GJ feeding tube is looped in the stomach with the tip in the proximal duodenum. Attempts were made to reduce  the loop in the stomach while maintaining access in the duodenum however this was unsuccessful. Duodenal access was lost. Accessing the duodenum is challenging related to the angulated gastrostomy access along the lesser curvature of the stomach. Attempts were made to access the duodenum with various catheters and guidewires. Only access obtainable into the duodenum included a large loop within the stomach. Attempts were made to pass the GJ feeding tube along this access pathway however this was unsuccessful. Catheter could not be advanced into the duodenum. Therefore, the procedure was aborted. Over the access an 82 French balloon retention gastrostomy was inserted. Balloon was inflated with 5 cc saline and retracted against anterior stomach wall. This was secured externally. Patient will be set up for a new gastrojejunostomy placement to improve the percutaneous access position and angulation to better access the proximal jejunum. IMPRESSION: Unsuccessful attempt at 45 French gastrojejunostomy feeding tube reposition and exchange, due to percutaneous access position and acute angulation. See above findings and plan for a new gastrojejunostomy placement. Electronically Signed   By: CHRISTELLA.  Shick M.D.   On: 03/30/2024 14:30   CT ABDOMEN PELVIS W CONTRAST Result Date: 03/28/2024 CLINICAL DATA:  Abdominal pain, acute, nonlocalized. Concern for GJ tube out of place. EXAM: CT ABDOMEN AND PELVIS WITH CONTRAST TECHNIQUE: Multidetector CT imaging of the abdomen and pelvis was performed using the standard protocol following bolus administration of intravenous contrast. RADIATION DOSE REDUCTION: This exam was performed according to the departmental dose-optimization program which includes automated exposure control, adjustment of the mA and/or kV according to patient size and/or use of iterative reconstruction technique. CONTRAST:  75mL OMNIPAQUE  IOHEXOL  350 MG/ML SOLN COMPARISON:  03/04/2024, 03/27/2024. FINDINGS: Lower  chest: No acute abnormality. Hepatobiliary: Hypodense region is noted in the left lobe of the liver adjacent to the falciform ligament suggesting focal fatty infiltration. The gallbladder is without stones. Intrahepatic biliary ductal dilatation. The common bile duct is normal in caliber. Pancreas: Coarse calcifications are noted in the pancreas, most pronounced at the head suggesting chronic pancreatitis. There is pancreatic ductal dilatation measuring at up to 1 cm in the tail, not significantly changed from the previous exam. Mild edema and fat stranding is unchanged. Loculated fluid collection at the head of the pancreas is not well delineated, however appears smaller as compared with the previous exam. Spleen: A small hypodense region is noted in the posterior aspect of the spleen with surrounding hypervascularity, query hemangioma. Adrenals/Urinary Tract: The adrenal glands are within normal limits. The kidneys enhance symmetrically. No renal calculus or hydronephrosis bilaterally. The bladder is unremarkable. Stomach/Bowel: A GJ tube loops in the stomach and terminates in the proximal duodenum. There is thickening of the walls of the duodenum and gastric antrum. No bowel obstruction, free air, or pneumatosis. Appendix appears normal. Vascular/Lymphatic: There is focal narrowing of the confluence of the portal vein, splenic vein, and superior mesenteric vein. No obvious filling defect is seen. Enlarged lymph nodes are noted about the pancreatic head and proximal duodenum, likely reactive. Reproductive: Prostate is unremarkable. Other: No abdominopelvic ascites. Musculoskeletal: No acute osseous abnormality. Sternotomy wires are present over the midline. IMPRESSION: 1. GJ tube loops in the stomach and terminates in the proximal duodenum. 2. Stable findings of acute on chronic pancreatitis with associated  gastritis and duodenitis. 3. Loculated fluid collection adjacent to the head of the pancreas is not well  delineated, however appears smaller as compared with the previous exam. 4. 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 Electronically Signed   By: Leita Birmingham M.D.   On: 03/28/2024 18:48   MR ABDOMEN MRCP W WO CONTAST Result Date: 03/27/2024 CLINICAL DATA:  elevated labs/?partial biliary obstruction; acute on chronic pancreatitis. EXAM: MRI ABDOMEN WITHOUT AND WITH CONTRAST (INCLUDING MRCP) TECHNIQUE: Multiplanar multisequence MR imaging of the abdomen was performed both before and after the administration of intravenous contrast. Heavily T2-weighted images of the biliary and pancreatic ducts were obtained, and three-dimensional MRCP images were rendered by post processing. CONTRAST:  6mL GADAVIST  GADOBUTROL  1 MMOL/ML IV SOLN COMPARISON:  CT scan abdomen and pelvis from 03/04/2024 and MRI abdomen from 01/23/2024. FINDINGS: Lower chest: Unremarkable MR appearance to the lung bases. No pleural effusion. No pericardial effusion. Normal heart size. Hepatobiliary: The liver is normal in size. Noncirrhotic configuration. No focal liver lesion. There is new mild central intrahepatic biliary ductal dilation. There is also moderate extrahepatic bile duct dilation. The extrahepatic bile duct measures up to 15 mm in the proximal portion and then gradually narrows in the midportion with smooth walls. At the junction of upper 2/3 and lower 1/3 it measures up to 3.5-4 mm and in the distal portion it measures up to 1-2 mm. No discrete obstructing mass or choledocholithiasis seen. 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 Physiologically distended gallbladder. No abnormal wall thickening or pericholecystic fat stranding. No cholelithiasis. Pancreas: Heterogeneous signal intensity of the pancreas. There is mild T1 hyperintense signal in the distal pancreatic body and tail region; however, the proximal pancreas  exhibit diffuse interstitial edema with fuzzy margins and peripancreatic fat stranding. There is heterogeneity and slightly comparative less enhancement of the pancreatic head/uncinate process and proximal body in relation to the rest of the distal pancreas. There is also marked dilation of the main pancreatic duct which measures up to 10-10.5 mm in the pancreatic body region. There are innumerable dilated/prominent pancreatic side branches. The main pancreatic duct gradually tapers before the ampulla of Vater. There are several peripancreatic fluid collections with largest along the anterior aspect of the head measuring up to 1.3 x 2.8 cm (series 6, image 26). The collection exhibits smooth enhancing walls. Overall, the collection has significantly decreased in size since the prior MRI from 01/23/2024. Spleen: Top-normal in size. Normal signal intensity. No focal lesion. Adrenals/Urinary Tract: Unremarkable adrenal glands. No hydroureteronephrosis. No suspicious renal mass. Stomach/Bowel: There is a at least 3.3 x 4.6 cm diverticulum arising from the fundus of the stomach. Percutaneous gastrojejunostomy tube is seen. Visualized portions within the abdomen are unremarkable. No disproportionate dilation of bowel loops. Vascular/Lymphatic: No pathologically enlarged lymph nodes identified. No abdominal aortic aneurysm demonstrated. No ascites. Other:  None. Musculoskeletal: No suspicious bone lesions identified. IMPRESSION: 1. 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 No discrete obstructing mass or choledocholithiasis seen. There is also marked dilation of the main pancreatic duct which gradually tapers before the Ampulla of Vater. There are innumerable dilated/prominent pancreatic side branches. There are several peripancreatic fluid collections with largest along the anterior aspect of the head measuring up to 1.3 x 2.8 cm. Overall, the collection has significantly  decreased in size since the prior MRI from 01/23/2024. 2. There is  diffuse interstitial edema of the pancreas with fuzzy margins and peripancreatic fat stranding. There is heterogeneity and slightly comparative less enhancement of the pancreatic head/uncinate process and proximal body in relation to the rest of the distal pancreas. Findings favor acute on chronic pancreatitis. 3. There is a 3.3 x 4.6 cm diverticulum arising from the fundus of the stomach. 4. Multiple other nonacute observations, as described above. Electronically Signed   By: Ree Molt M.D.   On: 03/27/2024 08:31   MR 3D Recon At Scanner Result Date: 03/27/2024 CLINICAL DATA:  elevated labs/?partial biliary obstruction; acute on chronic pancreatitis. EXAM: MRI ABDOMEN WITHOUT AND WITH CONTRAST (INCLUDING MRCP) TECHNIQUE: Multiplanar multisequence MR imaging of the abdomen was performed both before and after the administration of intravenous contrast. Heavily T2-weighted images of the biliary and pancreatic ducts were obtained, and three-dimensional MRCP images were rendered by post processing. CONTRAST:  6mL GADAVIST  GADOBUTROL  1 MMOL/ML IV SOLN COMPARISON:  CT scan abdomen and pelvis from 03/04/2024 and MRI abdomen from 01/23/2024. FINDINGS: Lower chest: Unremarkable MR appearance to the lung bases. No pleural effusion. No pericardial effusion. Normal heart size. Hepatobiliary: The liver is normal in size. Noncirrhotic configuration. No focal liver lesion. There is new mild central intrahepatic biliary ductal dilation. There is also moderate extrahepatic bile duct dilation. The extrahepatic bile duct measures up to 15 mm in the proximal portion and then gradually narrows in the midportion with smooth walls. At the junction of upper 2/3 and lower 1/3 it measures up to 3.5-4 mm and in the distal portion it measures up to 1-2 mm. No discrete obstructing mass or choledocholithiasis seen. Findings favor probable smooth stricture of the middle  third of the extrahepatic bile duct. Physiologically distended gallbladder. No abnormal wall thickening or pericholecystic fat stranding. No cholelithiasis. Pancreas: Heterogeneous signal intensity of the pancreas. There is mild T1 hyperintense signal in the distal pancreatic body and tail region; however, the proximal pancreas exhibit diffuse interstitial edema with fuzzy margins and peripancreatic fat stranding. There is heterogeneity and slightly comparative less enhancement of the pancreatic head/uncinate process and proximal body in relation to the rest of the distal pancreas. There is also marked dilation of the main pancreatic duct which measures up to 10-10.5 mm in the pancreatic body region. There are innumerable dilated/prominent pancreatic side branches. The main pancreatic duct gradually tapers before the ampulla of Vater. There are several peripancreatic fluid collections with largest along the anterior aspect of the head measuring up to 1.3 x 2.8 cm (series 6, image 26). The collection exhibits smooth enhancing walls. Overall, the collection has significantly decreased in size since the prior MRI from 01/23/2024. Spleen: Top-normal in size. Normal signal intensity. No focal lesion. Adrenals/Urinary Tract: Unremarkable adrenal glands. No hydroureteronephrosis. No suspicious renal mass. Stomach/Bowel: There is a at least 3.3 x 4.6 cm diverticulum arising from the fundus of the stomach. Percutaneous gastrojejunostomy tube is seen. Visualized portions within the abdomen are unremarkable. No disproportionate dilation of bowel loops. Vascular/Lymphatic: No pathologically enlarged lymph nodes identified. No abdominal aortic aneurysm demonstrated. No ascites. Other:  None. Musculoskeletal: No suspicious bone lesions identified. IMPRESSION: 1. 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 No discrete obstructing mass or choledocholithiasis seen. There is also marked  dilation of the main pancreatic duct which gradually tapers before the Ampulla of Vater. There are innumerable dilated/prominent pancreatic side branches. There are several peripancreatic fluid collections with largest along the anterior aspect of the head measuring up to  1.3 x 2.8 cm. Overall, the collection has significantly decreased in size since the prior MRI from 01/23/2024. 2. There is diffuse interstitial edema of the pancreas with fuzzy margins and peripancreatic fat stranding. There is heterogeneity and slightly comparative less enhancement of the pancreatic head/uncinate process and proximal body in relation to the rest of the distal pancreas. Findings favor acute on chronic pancreatitis. 3. There is a 3.3 x 4.6 cm diverticulum arising from the fundus of the stomach. 4. Multiple other nonacute observations, as described above. Electronically Signed   By: Ree Molt M.D.   On: 03/27/2024 08:31   IR GJ Tube Change Result Date: 03/19/2024 INDICATION: 33 year old male with history of pancreatitis status post percutaneous gastrojejunostomy tube placed on 02/13/2024 presenting with tube malfunction. EXAM: FLUOROSCOPIC GUIDED REPLACEMENT OF GASTROJEJUNOSTOMY TUBE COMPARISON:  02/13/2024 MEDICATIONS: None. CONTRAST:  25mL OMNIPAQUE  IOHEXOL  300 MG/ML SOLN, 25mL OMNIPAQUE  IOHEXOL  300 MG/ML SOLN, 25mL OMNIPAQUE  IOHEXOL  300 MG/ML SOLN FLUOROSCOPY TIME:  Seventy-eight mGy reference air kerma COMPLICATIONS: None. PROCEDURE: Informed written consent was obtained from the patient after a discussion of the risks and benefits. The upper abdomen and the external portion of the existing gastrojejunostomy tube was prepped and draped in the usual sterile fashion, and a sterile drape was applied covering the operative field. Maximum barrier sterile technique with sterile gowns and gloves were used for the procedure. A timeout was performed prior to the initiation of the procedure. Preprocedure radiograph demonstrated  coiled position of the jejunal arm within the body of the stomach. The external portion of the catheter was cut to release the retention balloon. A stiff Glidewire was inserted via the gastrostomy port and coiled within the gastric lumen. A 10 French angled sheath was then placed over the wire and used to direct the wire into the duodenum with moderate difficulty. The wire was advanced with the assistance of a Kumpe the catheter coaxially to the proximal jejunum. The catheter and sheath were removed and a new, 18 French balloon retention gastrojejunostomy tube was placed. The catheter tip was unable to be advanced beyond the second portion of the duodenum due to redundancy of the jejunal limb within the stomach. Balloon was inflated with 10 mL of sterile water . Contrast injection via the jejunostomy and gastric lumens confirmed appropriate functioning and positioning. A dressing was placed. The patient tolerated procedure well without immediate postprocedural complication. IMPRESSION: Successful fluoroscopic guided replacement of malpositioned 18 French gastrojejunostomy. The tip of the jejunostomy lumen lies within the second portion of the duodenum. Both lumens ready for immediate use. Ester Sides, MD [facility] Electronically Signed   By: Ester Sides M.D.   On: 03/19/2024 20:56   DG ERCP Result Date: 03/09/2024 CLINICAL DATA:  Chronic recurrent pancreatitis and pancreatic duct dilatation on MRCP EXAM: ERCP 14 fluoroscopic intra procedural views of the right upper quadrant submitted for interpretation TECHNIQUE: Multiple spot images obtained with the fluoroscopic device and submitted for interpretation post-procedure. FLUOROSCOPY: Radiation Exposure Index (as provided by the fluoroscopic device): 32.09 mGy Kerma COMPARISON:  None Available. FINDINGS: Images demonstrate what appears to be percutaneous enteric tube with retention balloon. Flexible  endoscopy device present with minimal contrast injection and no definite advancement into the common bile duct. IMPRESSION: Images of an attempted common bile duct access with minimal contrast injection. These images were submitted for radiologic interpretation only. Please see the procedural report for the amount of contrast and the fluoroscopy time utilized. Electronically Signed   By: Cordella  Jenna   On: 03/09/2024 16:09   DG C-Arm 1-60 Min-No Report Result Date: 03/09/2024 Fluoroscopy was utilized by the requesting physician.  No radiographic interpretation.   CT ABDOMEN PELVIS W CONTRAST Result Date: 03/04/2024 EXAM: CT ABDOMEN AND PELVIS WITH CONTRAST 03/04/2024 12:59:40 AM TECHNIQUE: CT of the abdomen and pelvis was performed with the administration of intravenous contrast. Multiplanar reformatted images are provided for review. Automated exposure control, iterative reconstruction, and/or weight-based adjustment of the mA/kV was utilized to reduce the radiation dose to as low as reasonably achievable. 50mL of iohexol  (OMNIPAQUE ) 350 MG/ML injection was used. COMPARISON: None available. CLINICAL HISTORY: Pancreatitis, acute, severe. FINDINGS: LOWER CHEST: No acute abnormality. LIVER: The liver is unremarkable. GALLBLADDER AND BILE DUCTS: Layering hyperdense material within the gallbladder lumen may represent inspissated bile and/or sludge. No superimposed pericholecystic inflammatory change. PANCREAS: Extensive parenchymal calcifications are seen throughout the head and body of the pancreas in keeping with changes of chronic pancreatitis. The main pancreatic duct is dilated measuring up to 12 mm in diameter within the mid body appears centrally constricted by dense calcification within the pancreatic head. There are extensive peripancreatic inflammatory changes within the pancreatic or duodenal groove and adjacent to the distal body of the stomach. These may relate to changes of acute on chronic  pancreatitis or severe gastritis/duodenitis. A loculated fluid collection extending from the head of the pancreas demonstrates a tortuous, lobulated coarse appearance, relatively stable in size since prior examination, measuring 1.5 x 3.5 cm. Two additional poorly circumscribed hyperdense fluid collections are seen adjacent to the Gastric Antrum and first portion of the duodenum, in keeping with peri-duodenal hematoma, measuring up to 2.2 x 4.9 cm and 1.2 x 2.6 cm. STOMACH AND BOWEL: A balloon retention gastrojejunostomy is again identified, however, the jejunal limb of the catheter is now seen looping within the stomach. Extensive circumferential wall thickening and hyperemia involve the distal body of the stomach, Gastric Antrum, and the first portion of the duodenum with obliteration of the enteric lumen in this region. PERITONEUM AND RETROPERITONEUM: Small simple-appearing free fluid is now seen within the pelvis, new since prior examination. No free intraperitoneal gas. Inflammatory changes extend from the duodenal region into the greater omentum surrounding the transverse colon. VASCULATURE: There is marked narrowing of the superior mesenteric vein and central splenic vein at the confluence with the main portal vein, however, these vessels remain patent. LYMPH NODES: No lymphadenopathy. BONES AND SOFT TISSUES: No acute bone abnormality. IMPRESSION: 1. Extensive parenchymal calcifications in the pancreatic head and body, dilated main pancreatic duct (up to 12 mm) with central constriction by dense calcification, and extensive peripancreatic inflammatory changes, consistent with acute on chronic pancreatitis. 2. Loculated fluid collection (1.5 x 3.5 cm) extending from the pancreatic head, relatively stable in size since prior examination. 3. Two additional early circumscribed hyperdense fluid collections adjacent to the gastric antrum and first portion of the duodenum, consistent with peri-duodenal hematoma,  measuring up to 2.2 x 4.9 cm and 1.2 x 2.6 cm. 4. Extensive circumferential wall thickening and hyperemia involving the distal body of the stomach, gastric antrum, and first portion of the duodenum with obliteration of the enteric lumen in this region, consistent with severe gastritis/duodenitis. 5. 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 6. 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 7. Small simple-appearing free fluid within the pelvis, new since  prior examination. Electronically signed by: Dorethia Molt MD 03/04/2024 01:18 AM EDT RP Workstation: [id number]    There are no new results to review at this time.  Previous records (including but not limited to H&P, progress notes, nursing notes, TOC management) were reviewed in assessment of this patient.  Labs: CBC: Recent Labs  Lab 03/25/24 1034 03/28/24 1620 03/29/24 0844 03/30/24 0900 03/31/24 0423  WBC 5.0 9.1 5.9 5.4 8.3  NEUTROABS 2.6  --   --   --   --   HGB 11.9* 10.0* 8.7* 8.7* 9.1*  HCT 36.7* 31.2* 27.1* 27.2* 28.9*  MCV 84.8 88.6 88.6 88.3 87.8  PLT 445.0* 444* 309 311 346   Basic Metabolic Panel: Recent Labs  Lab 03/25/24 1034 03/28/24 1620 03/29/24 0844 03/30/24 0900 03/31/24 0423  NA 136 136  --  141 138  K 3.9 3.6  --  3.7 4.0  CL 95* 97*  --  106 105  CO2 30 29  --  26 25  GLUCOSE 110* 103*  --  104* 103*  BUN 9 6  --  6 <5*  CREATININE 0.72 0.75 0.62 0.66 0.64  CALCIUM  9.8 8.9  --  8.5* 8.6*  MG  --   --   --   --  2.0  PHOS  --   --   --   --  3.9   Liver Function Tests: Recent Labs  Lab 03/25/24 1034 03/28/24 1620 03/31/24 0423  AST 115* 65* 33  ALT 174* 98* 55*  ALKPHOS 1300 Repeated and verified X2.* 628* 426*  BILITOT 1.9* 0.8 0.9  PROT 8.3 7.1 6.3*  ALBUMIN  4.4 3.4* 3.0*   CBG: No results for input(s): GLUCAP  in the last 168 hours.  Scheduled Meds:  Chlorhexidine  Gluconate Cloth  6 each Topical Daily   diclofenac Sodium  2 g Topical QID   insulin  aspart  0-9 Units Subcutaneous Q6H   lipase/protease/amylase  72,000 Units Oral TID AC   metoprolol  succinate  12.5 mg Oral QHS   multivitamin with minerals  1 tablet Oral Daily   pantoprazole  (PROTONIX ) IV  40 mg Intravenous Q24H   sodium chloride  flush  10-40 mL Intracatheter Q12H   thiamine  100 mg Oral Daily   Continuous Infusions:  dextrose  5 % and 0.45 % NaCl with KCl 20 mEq/L 50 mL/hr at 03/31/24 1200   TPN ADULT (ION)     PRN Meds:.HYDROmorphone  (DILAUDID ) injection, hydrOXYzine , methocarbamol , ondansetron  (ZOFRAN ) IV, sodium chloride  flush  Family Communication: None at bedside  Disposition: Status is: Inpatient Remains inpatient appropriate because: Dysphagia, TPN     Time spent: 40 minutes  Length of inpatient stay: 1 days  Author: Carliss LELON Canales, DO 03/31/2024 1:39 PM  For on call review [email or web address].

## 2024-04-01 ENCOUNTER — Inpatient Hospital Stay (HOSPITAL_COMMUNITY)

## 2024-04-01 ENCOUNTER — Encounter: Payer: Self-pay | Admitting: Internal Medicine

## 2024-04-01 DIAGNOSIS — K9423 Gastrostomy malfunction: Secondary | ICD-10-CM | POA: Diagnosis not present

## 2024-04-01 LAB — PROTIME-INR
INR: 1.9 — ABNORMAL HIGH (ref 0.8–1.2)
Prothrombin Time: 22.7 s — ABNORMAL HIGH (ref 11.4–15.2)

## 2024-04-01 LAB — CBC
HCT: 27.3 % — ABNORMAL LOW (ref 39.0–52.0)
Hemoglobin: 8.8 g/dL — ABNORMAL LOW (ref 13.0–17.0)
MCH: 28.5 pg (ref 26.0–34.0)
MCHC: 32.2 g/dL (ref 30.0–36.0)
MCV: 88.3 fL (ref 80.0–100.0)
Platelets: 357 K/uL (ref 150–400)
RBC: 3.09 MIL/uL — ABNORMAL LOW (ref 4.22–5.81)
RDW: 14.8 % (ref 11.5–15.5)
WBC: 9.1 K/uL (ref 4.0–10.5)
nRBC: 0 % (ref 0.0–0.2)

## 2024-04-01 LAB — GLUCOSE, CAPILLARY
Glucose-Capillary: 125 mg/dL — ABNORMAL HIGH (ref 70–99)
Glucose-Capillary: 114 mg/dL — ABNORMAL HIGH (ref 70–99)

## 2024-04-01 LAB — BASIC METABOLIC PANEL WITH GFR
Anion gap: 10 (ref 5–15)
BUN: <5 mg/dL — ABNORMAL LOW (ref 6–20)
CO2: 24 mmol/L (ref 22–32)
Calcium: 8.3 mg/dL — ABNORMAL LOW (ref 8.9–10.3)
Chloride: 103 mmol/L (ref 98–111)
Creatinine, Ser: 0.57 mg/dL — ABNORMAL LOW (ref 0.61–1.24)
GFR, Estimated: >60 mL/min (ref >60)
Glucose, Bld: 128 mg/dL — ABNORMAL HIGH (ref 70–99)
Potassium: 3.8 mmol/L (ref 3.5–5.1)
Sodium: 137 mmol/L (ref 135–145)

## 2024-04-01 LAB — MAGNESIUM: Magnesium: 2 mg/dL (ref 1.7–2.4)

## 2024-04-01 LAB — PHOSPHORUS: Phosphorus: 4.1 mg/dL (ref 2.5–4.6)

## 2024-04-01 LAB — HEPARIN LEVEL (UNFRACTIONATED): Heparin Unfractionated: <0.1 [IU]/mL — ABNORMAL LOW (ref 0.30–0.70)

## 2024-04-01 MED ORDER — GADOBUTROL 1 MMOL/ML IV SOLN
6.0000 mL | Freq: Once | INTRAVENOUS | Status: AC | PRN
  Administered 2024-04-01: 6 mL via INTRAVENOUS

## 2024-04-01 MED ORDER — THIAMINE MONONITRATE 100 MG PO TABS
100.0000 mg | ORAL_TABLET | Freq: Every day | ORAL | Status: AC
  Administered 2024-04-01 – 2024-04-04 (×3): 100 mg via ORAL
  Filled 2024-04-01 (×4): qty 1

## 2024-04-01 MED ORDER — HEPARIN (PORCINE) 25000 UT/250ML-% IV SOLN
1650.0000 [IU]/h | INTRAVENOUS | Status: DC
  Administered 2024-04-01: 850 [IU]/h via INTRAVENOUS
  Filled 2024-04-01: qty 250

## 2024-04-01 MED ORDER — OXYCODONE HCL 5 MG PO TABS
5.0000 mg | ORAL_TABLET | ORAL | Status: DC | PRN
  Administered 2024-04-01 – 2024-04-06 (×12): 5 mg via ORAL
  Filled 2024-04-01 (×12): qty 1

## 2024-04-01 MED ORDER — TRAVASOL 10 % IV SOLN
INTRAVENOUS | Status: AC
  Filled 2024-04-01: qty 780

## 2024-04-01 NOTE — Plan of Care (Incomplete)

## 2024-04-01 NOTE — Progress Notes (Signed)
 PHARMACY - TOTAL PARENTERAL NUTRITION CONSULT NOTE   Indication: intolerance to enteral feeding  Patient Measurements: Height: 5' 11 (180.3 cm) Weight: 63.7 kg (140 lb 6.9 oz) IBW/kg (Calculated) : 75.3 TPN AdjBW (KG): 63.5 Body mass index is 19.59 kg/m.  Assessment:  33 year old male with a medical history significant for chronic pancreatitis, history of aortic valve replacement requiring chronic anticoagulation who was recently admitted on 01/11/2024 for recurrent pancreatitis/pseudocyst. Patient was started on TPN and ultimately had gastrojejunostomy placed for enteral feeds and was discharged home. Readmitted 10/20 for GJ tube malfunction. Interventional radiology unable to replace GJ tube. Patient is unable to restart tube feeds and has not been able to eat many days prior to hospitalization. Report from IR that possible replacement of port location for better anatomical positioning of J-tube perhaps on Wednesday/Friday. Pharmacy consulted to initiate TPN in the interim.   Glucose / Insulin : no hx DM, CBGs < 130; 0 units insulin /24 hours Electrolytes: CoCa 9.1, others wnl Renal: Scr < 1, BUN < 5 Hepatic: AST/ALT 33/55, Alb 3.0, Tbili 0.9, Alk Phos 426; TG 103 Intake / Output:  LBM 10/19, UOP incompletely charted  GI Imaging 8/02 CT: severe sequelae of advanced pancreatitis 8/14 MRCP: acute pancreatitis, severe pancreatic ductal dilatation, concerning for ulceration to the bowel lumen 8/18 CT: gas density between the intraluminal gastric fluid and the wall of the stomach in the posterior and lateral portions of the fundus, concerning for pneumatosis 8/21 CT abd shows no evidence of gastric pneumatosis, stable multilocular cystic process anterior to the pancreatic head 8/28 DG abd shows nonobstructive bowel gas pattern   GI Surgeries / Procedures:  08/27 Cortrak placed 09/04 GJ placed  10/09 GJ tube malfunction s/p GJ tube changed  10/20 attempts with IR to reduce the loop in the  stomach was unsuccessful, tube access lost   Central access: 8/20 - 8/29; 10/21 PICC  TPN start date: 8/20 - 8/29; 10/21 >>  Nutritional Goals: Goal TPN rate is 85 mL/hr (provides 102 g of protein and 2303 kcals per day)  RD Assessment: Estimated Needs Total Energy Estimated Needs: 2300-2500 kcal Total Protein Estimated Needs: 100-120 gm Total Fluid Estimated Needs: >2L/day  Current Nutrition:  TPN 10/20 CLD  Plan:  Advance TPN to 65 mL/hr at 1800, provides 78 g protein, 1760 kcals meeting ~75% estimated nutritional needs Electrolytes in TPN: increase Na 75 mEq/L, increase K 50 mEq/L, Ca 3 mEq/L, Mg 3 mEq/L, decrease Phos 7 mmol/L. Cl:Ac 1:1 Remove standard MVI and trace elements to TPN, giving as po Remove Thiamine from TPN, give 100mg  x5d po Continue Sensitive q6h SSI and stop if not needing  Monitor TPN labs daily until stable at goal then on Mon/Thurs F/u GJ repositioning and TF start   Thank you for allowing pharmacy to be a part of this patient's care.  Jinnie Door, PharmD, BCPS, BCCP Clinical Pharmacist  Please check AMION for all [facility] Pharmacy phone numbers After 10:00 PM, call Main Pharmacy [phone]

## 2024-04-01 NOTE — Progress Notes (Signed)
 Patient ID: [name] [name], male   DOB: 10-09-1990, 33 y.o.   MRN: [id number] Patient was scheduled for new GJ placement with IR today. Unfortunately patient's INR is too high today to safely proceed (1.9).  IR attending Dr. Vanice will plan to tentatively proceed with procedure on Friday 10/24, pending INR improvement (1.5 or less). Hospitalist made aware, will plan to continue to re-checking INR until Friday.  Please reach out to IR with any questions or concerns.   Kimble DEL Yehya Brendle [name] 04/01/2024 1:45 PM

## 2024-04-01 NOTE — Progress Notes (Signed)
 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)  Allergies  Allergen Reactions   Amoxicillin Hives    Patient Measurements: Height: 5' 11 (180.3 cm) Weight: 63.7 kg (140 lb 6.9 oz) IBW/kg (Calculated) : 75.3 HEPARIN  DW (KG): 63.5  Vital Signs: Temp: 97.9 F (36.6 C) (10/22 0513) Temp Source: Oral (10/22 0513) BP: 133/81 (10/22 1527) Pulse Rate: 72 (10/22 1527)  Labs: Recent Labs    03/30/24 0900 03/31/24 0423 04/01/24 0150 04/01/24 1455  HGB 8.7* 9.1* 8.8*  --   HCT 27.2* 28.9* 27.3*  --   PLT 311 346 357  --   LABPROT 22.6* 27.2* 22.7*  --   INR 1.9* 2.4* 1.9*  --   HEPARINUNFRC  --  0.18*  --  <0.10*  CREATININE 0.66 0.64 0.57*  --    Estimated Creatinine Clearance: 118.3 mL/min (A) (by C-G formula based on SCr of 0.57 mg/dL (L)).  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 Transitioned to heparin  while warfarin is being held. INR decreased to 1.9 this morning, will continue to trend down.   Heparin  level undetectable on infusion at 850 units/hr. No issues with line or bleeding reported per RN.  Goal of Therapy:  INR 1.5-2 Heparin  level 0.3-0.7 units/ml Monitor platelets by anticoagulation protocol: Yes   Plan:  Increase heparin  to 1050 units/hr. Will f/u 6 hr heparin  level F/u timing of procedure and if heparin  needs to be held prior.   Vito Ralph, PharmD, BCPS Please see amion for complete clinical pharmacist phone list 04/01/2024,3:50 PM

## 2024-04-01 NOTE — Telephone Encounter (Signed)
 Inbound call from patient stating he would like to know if FMLA paperwork was filled out and ready for him to pick up Requesting a call back  Please advise  Thank you

## 2024-04-01 NOTE — Progress Notes (Signed)
 Progress Note   Patient: [name] [name] FMW:[id number] DOB: 1991/04/23 DOA: 03/28/2024     2 DOS: the patient was seen and examined on 04/01/2024    Assessment and Plan:  #GJ tube malfunction - Patient was sent from GI due to recurrent malfunction.   - IR consulted for placement but unable to replace GJ tube.  G-tube placed in interim.  Plan for replacement of port location for about better anatomical positioning of J-tube. - IR unable to replace G-tube today due to elevated INR.  Tentative plan is for Friday. - Continue TPN through PICC line - Continue heparin  drip while warfarin is held  # Chronic pancreatitis - Currently on tube feeds for nutrition at home.  However, has had recurrent issues and has been without tube feeds since Friday night. - Follows closely with outpatient GI - reportedly informed about the patient via secure chat  #Status post mechanical aortic valve - Currently on heparin  drip while warfarin is held in anticipation of IR procedure - Monitor INR daily  # Acute right shoulder pain - Patient complains of right shoulder plain, worse with abduction and internal rotation.  Pain started shortly after admission but prior to placement of PICC line.  Reports trouble sleeping due to pain and needing IV Dilaudid  every 3 hours with symptom relief for only 1 hour. - Right shoulder x-ray showed no acute osseous abnormalities -Per discussion with patient and his mother, would like to proceed with advanced imaging due to significant pain at this time while understanding that surgical intervention, if indicated, may not be completed during this admission - Continue Voltaren gel - Has been receiving IV dilaudid  every 3 hours, reports pain relief only for 1 hour - Ordered Oxycodone  5 mg q4h  - Ordered MRI right shoulder - PT/OT eval and treat     Subjective: Patient seen at bedside this morning.  Patient's mother is at bedside as well.  Patient reports  frustration with delay in receiving his replacement GJ tube.  Reports his abdominal pain is well-controlled however his main source of pain is his right shoulder.  Reports he is unable to sleep at night due to the pain, unable to take his shirt off.  Pain started shortly after admission but prior to PICC line placement.  Physical Exam: Vitals:   03/31/24 1608 03/31/24 2031 04/01/24 0500 04/01/24 0513  BP: 131/83 121/82  (!) 130/91  Pulse: 61 77  64  Resp: 18 19  17   Temp: 98.1 F (36.7 C) 98.9 F (37.2 C)  97.9 F (36.6 C)  TempSrc: Oral Oral  Oral  SpO2: 100% 100%  99%  Weight:   63.7 kg   Height:       Physical Exam Constitutional:      General: He is not in acute distress.    Appearance: He is not ill-appearing.  Cardiovascular:     Rate and Rhythm: Normal rate and regular rhythm.     Heart sounds: Normal heart sounds. No murmur heard. Pulmonary:     Effort: Pulmonary effort is normal. No respiratory distress.     Breath sounds: Normal breath sounds. No wheezing.  Abdominal:     Palpations: Abdomen is soft.     Tenderness: There is abdominal tenderness (mild lower abdominal TTP). There is no guarding.     Comments: G-tube in place, dressing c/d/i  Musculoskeletal:     Right lower leg: No edema.     Left lower leg: No edema.  Comments: RUE PICC line in place. Right shoulder painful on passive and active abduction and internal rotation.   Skin:    General: Skin is warm and dry.     Capillary Refill: Capillary refill takes less than 2 seconds.  Neurological:     Mental Status: He is alert and oriented to person, place, and time. Mental status is at baseline.       Family Communication: Discussed with patient and his mother at bedside.   Disposition: Status is: Inpatient Remains inpatient appropriate because: pending replacement of GJ-tube by IR  Planned Discharge Destination: Home    Time spent: 40 minutes  Author: Duffy Larch, MD 04/01/2024 1:11  PM  For on call review [email or web address].

## 2024-04-01 NOTE — Telephone Encounter (Signed)
 Dr. Albertus, Can you let me know when you are finished with the letter and I will call the patient back. Thanks.

## 2024-04-01 NOTE — Progress Notes (Addendum)
 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)  Allergies  Allergen Reactions   Amoxicillin Hives    Patient Measurements: Height: 5' 11 (180.3 cm) Weight: 63.7 kg (140 lb 6.9 oz) IBW/kg (Calculated) : 75.3 HEPARIN  DW (KG): 63.5  Vital Signs: Temp: 97.9 F (36.6 C) (10/22 0513) Temp Source: Oral (10/22 0513) BP: 130/91 (10/22 0513) Pulse Rate: 64 (10/22 0513)  Labs: Recent Labs    03/30/24 0900 03/31/24 0423 04/01/24 0150  HGB 8.7* 9.1* 8.8*  HCT 27.2* 28.9* 27.3*  PLT 311 346 357  LABPROT 22.6* 27.2* 22.7*  INR 1.9* 2.4* 1.9*  HEPARINUNFRC  --  0.18*  --   CREATININE 0.66 0.64 0.57*   Estimated Creatinine Clearance: 118.3 mL/min (A) (by C-G formula based on SCr of 0.57 mg/dL (L)).  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 Transitioned to heparin  while warfarin is being held. INR decreased to 1.9 this morning, will continue to trend down.   Goal of Therapy:  INR 1.5-2 Monitor platelets by anticoagulation protocol: Yes   Plan:  Resume heparin  now that INR is < 2.0. Start heparin  infusion at 850 units/hr.  Check heparin  level in 6 hours. F/U daily INR and CBC.  F/u timing of procedure and if heparin  needs to be held prior.   Administrator, arts Student Pharmacist  04/01/2024,8:57 AM

## 2024-04-02 ENCOUNTER — Telehealth: Payer: Self-pay

## 2024-04-02 ENCOUNTER — Telehealth: Payer: Self-pay | Admitting: *Deleted

## 2024-04-02 ENCOUNTER — Encounter: Payer: Self-pay | Admitting: Internal Medicine

## 2024-04-02 DIAGNOSIS — K9423 Gastrostomy malfunction: Secondary | ICD-10-CM | POA: Diagnosis not present

## 2024-04-02 LAB — COMPREHENSIVE METABOLIC PANEL WITH GFR
ALT: 33 U/L (ref 0–44)
AST: 17 U/L (ref 15–41)
Albumin: 2.9 g/dL — ABNORMAL LOW (ref 3.5–5.0)
Alkaline Phosphatase: 296 U/L — ABNORMAL HIGH (ref 38–126)
Anion gap: 8 (ref 5–15)
BUN: 9 mg/dL (ref 6–20)
CO2: 27 mmol/L (ref 22–32)
Calcium: 8.3 mg/dL — ABNORMAL LOW (ref 8.9–10.3)
Chloride: 102 mmol/L (ref 98–111)
Creatinine, Ser: 0.51 mg/dL — ABNORMAL LOW (ref 0.61–1.24)
GFR, Estimated: >60 mL/min (ref >60)
Glucose, Bld: 141 mg/dL — ABNORMAL HIGH (ref 70–99)
Potassium: 3.7 mmol/L (ref 3.5–5.1)
Sodium: 137 mmol/L (ref 135–145)
Total Bilirubin: 0.6 mg/dL (ref 0.0–1.2)
Total Protein: 6.4 g/dL — ABNORMAL LOW (ref 6.5–8.1)

## 2024-04-02 LAB — CBC
HCT: 27.8 % — ABNORMAL LOW (ref 39.0–52.0)
Hemoglobin: 8.9 g/dL — ABNORMAL LOW (ref 13.0–17.0)
MCH: 28.6 pg (ref 26.0–34.0)
MCHC: 32 g/dL (ref 30.0–36.0)
MCV: 89.4 fL (ref 80.0–100.0)
Platelets: 319 K/uL (ref 150–400)
RBC: 3.11 MIL/uL — ABNORMAL LOW (ref 4.22–5.81)
RDW: 14.9 % (ref 11.5–15.5)
WBC: 8.7 K/uL (ref 4.0–10.5)
nRBC: 0 % (ref 0.0–0.2)

## 2024-04-02 LAB — HEPARIN LEVEL (UNFRACTIONATED)
Heparin Unfractionated: <0.1 [IU]/mL — ABNORMAL LOW (ref 0.30–0.70)
Heparin Unfractionated: 0.16 [IU]/mL — ABNORMAL LOW (ref 0.30–0.70)
Heparin Unfractionated: 0.15 [IU]/mL — ABNORMAL LOW (ref 0.30–0.70)

## 2024-04-02 LAB — GLUCOSE, CAPILLARY
Glucose-Capillary: 118 mg/dL — ABNORMAL HIGH (ref 70–99)
Glucose-Capillary: 130 mg/dL — ABNORMAL HIGH (ref 70–99)
Glucose-Capillary: 143 mg/dL — ABNORMAL HIGH (ref 70–99)
Glucose-Capillary: 144 mg/dL — ABNORMAL HIGH (ref 70–99)

## 2024-04-02 LAB — MAGNESIUM: Magnesium: 1.9 mg/dL (ref 1.7–2.4)

## 2024-04-02 LAB — PROTIME-INR
INR: 1.2 (ref 0.8–1.2)
Prothrombin Time: 15.4 s — ABNORMAL HIGH (ref 11.4–15.2)

## 2024-04-02 LAB — PHOSPHORUS: Phosphorus: 4.1 mg/dL (ref 2.5–4.6)

## 2024-04-02 MED ORDER — CEFAZOLIN SODIUM-DEXTROSE 2-4 GM/100ML-% IV SOLN
2.0000 g | INTRAVENOUS | Status: AC
  Filled 2024-04-02: qty 100

## 2024-04-02 MED ORDER — TRAVASOL 10 % IV SOLN
INTRAVENOUS | Status: AC
  Filled 2024-04-02: qty 1020

## 2024-04-02 NOTE — Telephone Encounter (Signed)
 Informed patient that I will send this message to Dr. Albertus to work on his letter for [employer] keeping him out work for the remainder of the year. Patient states he needs it today so he does not lose his job and insurance. Informed patient I will send a reminder to Dr. Albertus.

## 2024-04-02 NOTE — Plan of Care (Signed)

## 2024-04-02 NOTE — Progress Notes (Signed)
 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)  Allergies  Allergen Reactions   Amoxicillin Hives    Patient Measurements: Height: 5' 11 (180.3 cm) Weight: 63.7 kg (140 lb 6.9 oz) IBW/kg (Calculated) : 75.3 HEPARIN  DW (KG): 63.5  Vital Signs: Temp: 97.9 F (36.6 C) (10/23 2035) Temp Source: Oral (10/23 2035) BP: 144/89 (10/23 2035) Pulse Rate: 86 (10/23 2035)  Labs: Recent Labs    03/31/24 0423 04/01/24 0150 04/01/24 1455 04/02/24 0107 04/02/24 1210 04/02/24 2126  HGB 9.1* 8.8*  --  8.9*  --   --   HCT 28.9* 27.3*  --  27.8*  --   --   PLT 346 357  --  319  --   --   LABPROT 27.2* 22.7*  --  15.4*  --   --   INR 2.4* 1.9*  --  1.2  --   --   HEPARINUNFRC 0.18*  --    < > <0.10* 0.16* 0.15*  CREATININE 0.64 0.57*  --  0.51*  --   --    < > = values in this interval not displayed.   Estimated Creatinine Clearance: 118.3 mL/min (A) (by C-G formula based on SCr of 0.51 mg/dL (L)).  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 Transitioned to heparin  while warfarin is being held.   INR is now 1.2.  Heparin  level subtherapeutic @ 0.16 despite dose increase to 1300 units/hr.   PM: heparin  level remains subtherapeutic at 0.15 on 1450 units/hr. No issues with the infusion or bleeding reported.  Goal of Therapy:  INR 1.5-2 Heparin  level 0.3-0.7 units/ml Monitor platelets by anticoagulation protocol: Yes   Plan:  Increase heparin  to 1650 units/hr 6 hr heparin  level with AM labs Heparin  to be held at 0600 tomorrow for IR procedure  Rocky Slade, PharmD, BCPS 04/02/2024. 10:05 PM  Please check AMION for all [facility] Pharmacy phone numbers After 10:00 PM, call Main Pharmacy [phone]

## 2024-04-02 NOTE — Progress Notes (Signed)
 PT Cancellation Note and Discharge  Patient Details Name: [name] [name] MRN: [id number] DOB: [birth year]   Cancelled Treatment:    Reason Eval/Treat Not Completed: PT screened, no needs identified, will sign off. Discussed pt case with OT who reports pt is currently mobilizing independently and does not require a formal PT evaluation at this time. PT signing off. If needs change, please reconsult.     Leita JONETTA Sable 04/02/2024, 12:19 PM  Leita Sable, PT, DPT Acute Rehabilitation Services Secure Chat Preferred Office: [phone]

## 2024-04-02 NOTE — Telephone Encounter (Signed)
 Inbound call from patient stating he needs paperwork today since its been taking too long and he feels like he will be kicked off insurance and work.  Please advise  Thank you

## 2024-04-02 NOTE — Consult Note (Signed)
 Chief Complaint:  [name] [name]  Procedure: GJ Tube Placement  Referring Provider(s): Dr. Marsha Ada  Supervising Physician: Vanice Revel  Patient Status: [facility] - In-pt  History of Present Illness: [name] [name] is a 33 y.o. male with a history of chronic/recurrent pancreatitis, pancreatic pseudocyst, gastric pneumatosis, alcohol used disorder, and SIBO who presented to the ED on 10/19 with complaints of abdominal pain. Patient reported nausea and vomiting for the past 24hrs. CT imaging completed and revealed GJ tube loops in the stomach and terminates in the proximal duodenum. IR consulted for repositioning which was attempted on 10/20. Unfortunately, due to the percutaneous access position and acute angulation, the repositioning was unsuccessful and it was determined that a new tube would need to be placed.   Patient is ambulating around the floor in no acute distress. States that he continues to have discomfort in his abdomen, particularly in the area of the remaining tube. States that his pain is 5/10 at rest and 8/10 with ambulation. He also reports pain in his right shoulder. Patient denies any fevers/chills, nausea/vomiting, chest pain, or shortness of breath. All questions and concerns answered at the bedside.   Patient is Full Code  Past Medical History:  Diagnosis Date   Abnormal finding on GI tract imaging 12/07/2023   Aortic valve calcification 06/08/2020   Asthma    Headache    migraines   Heart murmur    Hypokalemia 06/08/2020   Leukocytosis 01/23/2022   Mild aortic stenosis    Pancreatitis    Pneumonia    as a child x several   Substance abuse (HCC)    Subtherapeutic international normalized ratio (INR) 01/23/2022   Transaminitis 06/08/2020    Past Surgical History:  Procedure Laterality Date   AORTIC VALVE REPLACEMENT N/A 09/27/2021   Procedure: AORTIC VALVE REPLACEMENT (AVR) USING ON-X AORTIC VALVE SIZE ;  Surgeon: Kerrin Elspeth BROCKS, MD;  Location: [facility] OR;  Service: Open Heart Surgery;  Laterality: N/A;   BIOPSY  08/01/2023   Procedure: BIOPSY;  Surgeon: Wilhelmenia Aloha Raddle., MD;  Location: [facility];  Service: Gastroenterology;;   ERCP N/A 03/09/2024   Procedure: ERCP, WITH INTERVENTION IF INDICATED;  Surgeon: Wilhelmenia Aloha Raddle., MD;  Location: [facility];  Service: Gastroenterology;  Laterality: N/A;  aborted   ESOPHAGOGASTRODUODENOSCOPY N/A 08/01/2023   Procedure: ESOPHAGOGASTRODUODENOSCOPY (EGD);  Surgeon: Wilhelmenia Aloha Raddle., MD;  Location: [facility];  Service: Gastroenterology;  Laterality: N/A;   ESOPHAGOGASTRODUODENOSCOPY N/A 03/09/2024   Procedure: EGD (ESOPHAGOGASTRODUODENOSCOPY);  Surgeon: Wilhelmenia Aloha Raddle., MD;  Location: [facility];  Service: Gastroenterology;  Laterality: N/A;   EUS N/A 08/01/2023   Procedure: UPPER ENDOSCOPIC ULTRASOUND (EUS) RADIAL;  Surgeon: Wilhelmenia Aloha Raddle., MD;  Location: [facility];  Service: Gastroenterology;  Laterality: N/A;   FINE NEEDLE ASPIRATION N/A 08/01/2023   Procedure: FINE NEEDLE ASPIRATION (FNA) LINEAR;  Surgeon: Wilhelmenia Aloha Raddle., MD;  Location: [facility];  Service: Gastroenterology;  Laterality: N/A;   INGUINAL HERNIA REPAIR Right 01/05/2022   Procedure: OPEN REPAIR INCARCERATED RIGHT INGUINAL HERNIA WITH MESH;  Surgeon: Tanda Locus, MD;  Location: [facility];  Service: General;  Laterality: Right;  90 MIN - ROOM 2   INGUINAL HERNIA REPAIR Left 12/20/2022   Procedure: OPEN REPAIR LEFT INGUINAL HERNIA WITH MESH;  Surgeon: Tanda Locus, MD;  Location: [facility];  Service: General;  Laterality: Left;   IR GASTR TUBE CONVERT GASTR-JEJ PER W/FL MOD SED  02/13/2024   IR GASTROSTOMY TUBE MOD SED  02/13/2024  IR GJ TUBE CHANGE  03/19/2024   IR GJ TUBE CHANGE  03/30/2024   REPLACEMENT ASCENDING AORTA N/A 09/27/2021   Procedure: REPLACEMENT ASCENDING AND INNOMINATE ANEURYSM USING HEMASHIELD PLATINUM [id number];  Surgeon: Kerrin Elspeth BROCKS,  MD;  Location: [facility] OR;  Service: Open Heart Surgery;  Laterality: N/A;   RIGHT HEART CATH AND CORONARY ANGIOGRAPHY N/A 09/07/2021   Procedure: RIGHT HEART CATH AND CORONARY ANGIOGRAPHY;  Surgeon: Wendel Lurena POUR, MD;  Location: [facility];  Service: Cardiovascular;  Laterality: N/A;   TEE WITHOUT CARDIOVERSION N/A 09/27/2021   Procedure: TRANSESOPHAGEAL ECHOCARDIOGRAM (TEE);  Surgeon: Kerrin Elspeth BROCKS, MD;  Location: [facility] OR;  Service: Open Heart Surgery;  Laterality: N/A;    Allergies: Amoxicillin  Medications: Prior to Admission medications   Medication Sig Start Date End Date Taking? Authorizing Provider  acetaminophen  (TYLENOL ) 500 MG tablet Take 1,000 mg by mouth every 6 (six) hours as needed for moderate pain (pain score 4-6).   Yes [provider]  aspirin  EC 81 MG EC tablet Take 1 tablet (81 mg total) by mouth daily. Swallow whole. 10/02/21  Yes Barrett, Erin R, [name]  hydrOXYzine  (ATARAX ) 25 MG tablet Take 1 tablet (25 mg total) by mouth 3 (three) times daily as needed for anxiety. 02/17/24  Yes Sigdel, Santosh, MD  lipase/protease/amylase (CREON ) 36000 UNITS CPEP capsule Take 2 capsules (72,000 Units total) by mouth 3 (three) times daily before meals. 12/09/23  Yes Perri DELENA Meliton Mickey., MD  loratadine  (CLARITIN ) 10 MG tablet Take 10 mg by mouth daily.   Yes [provider]  methocarbamol  (ROBAXIN ) 500 MG tablet Take 1-2 tablets (500-1,000 mg total) by mouth 4 (four) times daily. Patient taking differently: Take 500 mg by mouth daily as needed for muscle spasms. 11/19/23  Yes Lucius Krabbe, NP  metoprolol  succinate (TOPROL  XL) 25 MG 24 hr tablet Take 0.5 tablets (12.5 mg total) by mouth at bedtime. 07/10/23  Yes Thukkani, Arun K, MD  ondansetron  (ZOFRAN -ODT) 4 MG disintegrating tablet Take 4 mg by mouth every 8 (eight) hours as needed. 02/24/24  Yes [provider]  Oxycodone  HCl 10 MG TABS Take 1 tablet (10 mg total) by mouth every 4 (four) hours as  needed for up to 14 days. 03/25/24 04/08/24 Yes Pyrtle, Gordy HERO, MD  pantoprazole  (PROTONIX ) 40 MG tablet Take 1 tablet (40 mg total) by mouth 2 (two) times daily before a meal. 03/09/24 04/08/24 Yes Mansouraty, Aloha Mickey., MD  polyethylene glycol powder (GLYCOLAX /MIRALAX ) 17 GM/SCOOP powder Place 1 capful (17 g) into feeding tube 2 (two) times daily. Patient taking differently: Take 17 g by mouth daily as needed for mild constipation. 02/17/24  Yes Sigdel, Santosh, MD  warfarin (COUMADIN ) 3 MG tablet TAKE 1 & 1/2 TABLETS TO 2 TABLETS BY MOUTH DAILY OR AS DIRECTED BY COAGULATION CLINIC 02/17/24  Yes Sigdel, Derryl, MD     Family History  Problem Relation Age of Onset   Pancreatitis Maternal Uncle    Colon cancer Neg Hx    Esophageal cancer Neg Hx     Social History   Socioeconomic History   Marital status: Single    Spouse name: Not on file   Number of children: 0   Years of education: Not on file   Highest education level: Bachelor's degree (e.g., BA, AB, BS)  Occupational History   Occupation: Bartender    Comment: [employer]  Tobacco Use   Smoking status: Never   Smokeless tobacco: Never  Vaping Use  Vaping status: Never Used  Substance and Sexual Activity   Alcohol use: Not Currently    Comment: none x 1 month as od 04/11/2023   Drug use: Not Currently    Types: Other-see comments    Comment: Edibles occasionally marijuana   Sexual activity: Yes  Other Topics Concern   Not on file  Social History Narrative   Not on file   Social Drivers of Health   Financial Resource Strain: Low Risk  (04/25/2023)   Overall Financial Resource Strain (CARDIA)    Difficulty of Paying Living Expenses: Not very hard  Food Insecurity: No Food Insecurity (03/29/2024)   Hunger Vital Sign    Worried About Running Out of Food in the Last Year: Never true    Ran Out of Food in the Last Year: Never true  Transportation Needs: No Transportation Needs (03/29/2024)   PRAPARE -  Administrator, Civil Service (Medical): No    Lack of Transportation (Non-Medical): No  Physical Activity: Sufficiently Active (04/25/2023)   Exercise Vital Sign    Days of Exercise per Week: 5 days    Minutes of Exercise per Session: 150+ min  Stress: No Stress Concern Present (04/25/2023)   [employer] of Occupational Health - Occupational Stress Questionnaire    Feeling of Stress : Only a little  Social Connections: Unknown (04/25/2023)   Social Connection and Isolation Panel    Frequency of Communication with Friends and Family: Three times a week    Frequency of Social Gatherings with Friends and Family: Once a week    Attends Religious Services: Patient declined    Database administrator or Organizations: No    Attends Engineer, structural: Not on file    Marital Status: Never married    Review of Systems  Gastrointestinal:  Positive for abdominal pain (in the area of the current g-tube, worse with ambulation).  Patient denies any headache, chest pain, shortness of breath, N/V, or fever/chills. All other systems are negative.   Vital Signs: BP 109/77 (BP Location: Left Arm)   Pulse 64   Temp 98 F (36.7 C) (Oral)   Resp 18   Ht 5' 11 (1.803 m)   Wt 140 lb 6.9 oz (63.7 kg)   SpO2 98%   BMI 19.59 kg/m    Physical Exam Constitutional:      Appearance: Normal appearance.  HENT:     Mouth/Throat:     Mouth: Mucous membranes are moist.     Pharynx: Oropharynx is clear.  Cardiovascular:     Rate and Rhythm: Normal rate and regular rhythm.     Heart sounds: Normal heart sounds.  Pulmonary:     Effort: Pulmonary effort is normal.     Breath sounds: Normal breath sounds.  Abdominal:     General: Abdomen is flat.     Palpations: Abdomen is soft.     Tenderness: There is abdominal tenderness (surrounding g-tube).  Skin:    General: Skin is warm and dry.  Neurological:     Mental Status: He is alert and oriented to person, place, and time.   Psychiatric:        Behavior: Behavior normal.     Imaging: MR SHOULDER RIGHT W WO CONTRAST Result Date: 04/02/2024 CLINICAL DATA:  Right shoulder pain. History of long-term anticoagulant use, previous aortic valve replacement and chronic pancreatitis. EXAM: MRI OF THE RIGHT SHOULDER WITHOUT AND WITH CONTRAST TECHNIQUE: Multiplanar, multisequence MR imaging of the right shoulder  was performed before and after the administration of intravenous contrast. CONTRAST:  6mL GADAVIST  GADOBUTROL  1 MMOL/ML IV SOLN COMPARISON:  Radiographs 03/31/2024.  Chest CTA 08/31/2021. FINDINGS: Rotator cuff:  Intact without significant tendinosis. Muscles: There is a complex fluid collection in the right lateral chest wall, anterior to the right scapula, involving the subscapularis and latissimus dorsi muscles. This measures approximately 3.7 x 3.1 x 3.6 cm and demonstrates thin septations and intrinsic T1 shortening, most consistent with a subacute hematoma. There is peripheral enhancement following contrast. There is significant surrounding soft tissue edema and enhancement, and an infected fluid collection is not excluded by this examination. No significant muscular edema or atrophy in the additional rotator cuff muscles. Biceps long head:  Intact and normally positioned. Acromioclavicular Joint: The acromion is type 2. There are mild acromioclavicular degenerative changes. No significant fluid is present in the subacromial - subdeltoid bursa. Glenohumeral Joint: Mild glenohumeral degenerative changes. Small joint effusion with mildly prominent fluid in the superior subscapularis recess. Mild associated synovial enhancement without definite signs of septic arthritis. Labrum: No evidence of labral tear or paralabral cyst. Bones: No evidence of acute fracture, dislocation or osteomyelitis about the right shoulder. Other: As above, large complex peripherally enhancing fluid collection in the right lateral chest wall with  surrounding soft tissue edema and enhancement. IMPRESSION: 1. Large complex peripherally enhancing fluid collection in the right lateral chest wall with surrounding soft tissue edema and enhancement, most consistent with a subacute hematoma. Cannot exclude superimposed infection by this examination. Recommend clinical correlation and consideration of aspiration under ultrasound, as clinically warranted. 2. No evidence of osteomyelitis or septic arthritis. 3. The rotator cuff, biceps tendon and labrum appear intact. 4. Mild glenohumeral and acromioclavicular degenerative changes. Electronically Signed   By: Elsie Perone M.D.   On: 04/02/2024 11:20   DG Shoulder Right Result Date: 03/31/2024 EXAM: 1 VIEW XRAY OF THE RIGHT SHOULDER 03/31/2024 01:34:00 PM COMPARISON: None available. CLINICAL HISTORY: Pain in r shoulder FINDINGS: BONES AND JOINTS: Glenohumeral joint is normally aligned. No acute fracture or dislocation. The [facility] joint is unremarkable in appearance. SOFT TISSUES: Surgical clips in right axilla. Right upper extremity PICC in place. No abnormal calcifications. Visualized lung is unremarkable. IMPRESSION: 1. No acute osseous abnormality of the right shoulder. Electronically signed by: Donnice Mania MD 03/31/2024 05:20 PM EDT RP Workstation: [id number]   US  EKG SITE RITE Result Date: 03/30/2024 If Site Rite image not attached, placement could not be confirmed due to current cardiac rhythm.  IR GJ Tube Change Result Date: 03/30/2024 INDICATION: Malfunctioning gastrojejunostomy EXAM: FLUOROSCOPIC EXCHANGE FOR  GASTROSTOMY Date:  03/30/2024 03/30/2024 12:23 pm Radiologist:  M. Frederic Specking, MD Guidance:  FLUOROSCOPIC MEDICATIONS: NONE. ANESTHESIA/SEDATION: Moderate Sedation Time:  NONE. The patient was continuously monitored during the procedure by the interventional radiology nurse under my direct supervision. CONTRAST:  30mL OMNIPAQUE  IOHEXOL  300 MG/ML SOLN - administered into the gastric lumen.  FLUOROSCOPY: Fluoroscopy Time: 10 minutes 5 seconds (31 mGy). COMPLICATIONS: None immediate. PROCEDURE: Informed consent was obtained from the patient following explanation of the procedure, risks, benefits and alternatives. The patient understands, agrees and consents for the procedure. All questions were addressed. A time out was performed. Existing malpositioned GJ feeding tube was injected with contrast under fluoroscopy. GJ feeding tube is looped in the stomach with the tip in the proximal duodenum. Attempts were made to reduce the loop in the stomach while maintaining access in the duodenum however this was unsuccessful. Duodenal access was lost. Accessing the duodenum  is challenging related to the angulated gastrostomy access along the lesser curvature of the stomach. Attempts were made to access the duodenum with various catheters and guidewires. Only access obtainable into the duodenum included a large loop within the stomach. Attempts were made to pass the GJ feeding tube along this access pathway however this was unsuccessful. Catheter could not be advanced into the duodenum. Therefore, the procedure was aborted. Over the access an 42 French balloon retention gastrostomy was inserted. Balloon was inflated with 5 cc saline and retracted against anterior stomach wall. This was secured externally. Patient will be set up for a new gastrojejunostomy placement to improve the percutaneous access position and angulation to better access the proximal jejunum. IMPRESSION: Unsuccessful attempt at 51 French gastrojejunostomy feeding tube reposition and exchange, due to percutaneous access position and acute angulation. See above findings and plan for a new gastrojejunostomy placement. Electronically Signed   By: CHRISTELLA.  Shick M.D.   On: 03/30/2024 14:30   CT ABDOMEN PELVIS W CONTRAST Result Date: 03/28/2024 CLINICAL DATA:  Abdominal pain, acute, nonlocalized. Concern for GJ tube out of place. EXAM: CT ABDOMEN AND  PELVIS WITH CONTRAST TECHNIQUE: Multidetector CT imaging of the abdomen and pelvis was performed using the standard protocol following bolus administration of intravenous contrast. RADIATION DOSE REDUCTION: This exam was performed according to the departmental dose-optimization program which includes automated exposure control, adjustment of the mA and/or kV according to patient size and/or use of iterative reconstruction technique. CONTRAST:  75mL OMNIPAQUE  IOHEXOL  350 MG/ML SOLN COMPARISON:  03/04/2024, 03/27/2024. FINDINGS: Lower chest: No acute abnormality. Hepatobiliary: Hypodense region is noted in the left lobe of the liver adjacent to the falciform ligament suggesting focal fatty infiltration. The gallbladder is without stones. Intrahepatic biliary ductal dilatation. The common bile duct is normal in caliber. Pancreas: Coarse calcifications are noted in the pancreas, most pronounced at the head suggesting chronic pancreatitis. There is pancreatic ductal dilatation measuring at up to 1 cm in the tail, not significantly changed from the previous exam. Mild edema and fat stranding is unchanged. Loculated fluid collection at the head of the pancreas is not well delineated, however appears smaller as compared with the previous exam. Spleen: A small hypodense region is noted in the posterior aspect of the spleen with surrounding hypervascularity, query hemangioma. Adrenals/Urinary Tract: The adrenal glands are within normal limits. The kidneys enhance symmetrically. No renal calculus or hydronephrosis bilaterally. The bladder is unremarkable. Stomach/Bowel: A GJ tube loops in the stomach and terminates in the proximal duodenum. There is thickening of the walls of the duodenum and gastric antrum. No bowel obstruction, free air, or pneumatosis. Appendix appears normal. Vascular/Lymphatic: There is focal narrowing of the confluence of the portal vein, splenic vein, and superior mesenteric vein. No obvious filling  defect is seen. Enlarged lymph nodes are noted about the pancreatic head and proximal duodenum, likely reactive. Reproductive: Prostate is unremarkable. Other: No abdominopelvic ascites. Musculoskeletal: No acute osseous abnormality. Sternotomy wires are present over the midline. IMPRESSION: 1. GJ tube loops in the stomach and terminates in the proximal duodenum. 2. Stable findings of acute on chronic pancreatitis with associated gastritis and duodenitis. 3. Loculated fluid collection adjacent to the head of the pancreas is not well delineated, however appears smaller as compared with the previous exam. 4. 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 Electronically Signed   By: Leita Birmingham M.D.   On: 03/28/2024 18:48   MR ABDOMEN MRCP  W WO CONTAST Result Date: 03/27/2024 CLINICAL DATA:  elevated labs/?partial biliary obstruction; acute on chronic pancreatitis. EXAM: MRI ABDOMEN WITHOUT AND WITH CONTRAST (INCLUDING MRCP) TECHNIQUE: Multiplanar multisequence MR imaging of the abdomen was performed both before and after the administration of intravenous contrast. Heavily T2-weighted images of the biliary and pancreatic ducts were obtained, and three-dimensional MRCP images were rendered by post processing. CONTRAST:  6mL GADAVIST  GADOBUTROL  1 MMOL/ML IV SOLN COMPARISON:  CT scan abdomen and pelvis from 03/04/2024 and MRI abdomen from 01/23/2024. FINDINGS: Lower chest: Unremarkable MR appearance to the lung bases. No pleural effusion. No pericardial effusion. Normal heart size. Hepatobiliary: The liver is normal in size. Noncirrhotic configuration. No focal liver lesion. There is new mild central intrahepatic biliary ductal dilation. There is also moderate extrahepatic bile duct dilation. The extrahepatic bile duct measures up to 15 mm in the proximal portion and then gradually narrows in the midportion with smooth walls. At the junction of upper 2/3  and lower 1/3 it measures up to 3.5-4 mm and in the distal portion it measures up to 1-2 mm. No discrete obstructing mass or choledocholithiasis seen. 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 Physiologically distended gallbladder. No abnormal wall thickening or pericholecystic fat stranding. No cholelithiasis. Pancreas: Heterogeneous signal intensity of the pancreas. There is mild T1 hyperintense signal in the distal pancreatic body and tail region; however, the proximal pancreas exhibit diffuse interstitial edema with fuzzy margins and peripancreatic fat stranding. There is heterogeneity and slightly comparative less enhancement of the pancreatic head/uncinate process and proximal body in relation to the rest of the distal pancreas. There is also marked dilation of the main pancreatic duct which measures up to 10-10.5 mm in the pancreatic body region. There are innumerable dilated/prominent pancreatic side branches. The main pancreatic duct gradually tapers before the ampulla of Vater. There are several peripancreatic fluid collections with largest along the anterior aspect of the head measuring up to 1.3 x 2.8 cm (series 6, image 26). The collection exhibits smooth enhancing walls. Overall, the collection has significantly decreased in size since the prior MRI from 01/23/2024. Spleen: Top-normal in size. Normal signal intensity. No focal lesion. Adrenals/Urinary Tract: Unremarkable adrenal glands. No hydroureteronephrosis. No suspicious renal mass. Stomach/Bowel: There is a at least 3.3 x 4.6 cm diverticulum arising from the fundus of the stomach. Percutaneous gastrojejunostomy tube is seen. Visualized portions within the abdomen are unremarkable. No disproportionate dilation of bowel loops. Vascular/Lymphatic: No pathologically enlarged lymph nodes identified. No abdominal aortic aneurysm demonstrated. No ascites. Other:  None. Musculoskeletal: No suspicious bone  lesions identified. IMPRESSION: 1. 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 No discrete obstructing mass or choledocholithiasis seen. There is also marked dilation of the main pancreatic duct which gradually tapers before the Ampulla of Vater. There are innumerable dilated/prominent pancreatic side branches. There are several peripancreatic fluid collections with largest along the anterior aspect of the head measuring up to 1.3 x 2.8 cm. Overall, the collection has significantly decreased in size since the prior MRI from 01/23/2024. 2. There is diffuse interstitial edema of the pancreas with fuzzy margins and peripancreatic fat stranding. There is heterogeneity and slightly comparative less enhancement of the pancreatic head/uncinate process and proximal body in relation to the rest of the distal pancreas. Findings favor acute on chronic pancreatitis. 3. There is a 3.3 x 4.6 cm diverticulum arising from the fundus of the stomach. 4. Multiple other nonacute observations,  as described above. Electronically Signed   By: Ree Molt M.D.   On: 03/27/2024 08:31   MR 3D Recon At Scanner Result Date: 03/27/2024 CLINICAL DATA:  elevated labs/?partial biliary obstruction; acute on chronic pancreatitis. EXAM: MRI ABDOMEN WITHOUT AND WITH CONTRAST (INCLUDING MRCP) TECHNIQUE: Multiplanar multisequence MR imaging of the abdomen was performed both before and after the administration of intravenous contrast. Heavily T2-weighted images of the biliary and pancreatic ducts were obtained, and three-dimensional MRCP images were rendered by post processing. CONTRAST:  6mL GADAVIST  GADOBUTROL  1 MMOL/ML IV SOLN COMPARISON:  CT scan abdomen and pelvis from 03/04/2024 and MRI abdomen from 01/23/2024. FINDINGS: Lower chest: Unremarkable MR appearance to the lung bases. No pleural effusion. No pericardial effusion. Normal heart size. Hepatobiliary: The liver is normal in size.  Noncirrhotic configuration. No focal liver lesion. There is new mild central intrahepatic biliary ductal dilation. There is also moderate extrahepatic bile duct dilation. The extrahepatic bile duct measures up to 15 mm in the proximal portion and then gradually narrows in the midportion with smooth walls. At the junction of upper 2/3 and lower 1/3 it measures up to 3.5-4 mm and in the distal portion it measures up to 1-2 mm. No discrete obstructing mass or choledocholithiasis seen. 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 Physiologically distended gallbladder. No abnormal wall thickening or pericholecystic fat stranding. No cholelithiasis. Pancreas: Heterogeneous signal intensity of the pancreas. There is mild T1 hyperintense signal in the distal pancreatic body and tail region; however, the proximal pancreas exhibit diffuse interstitial edema with fuzzy margins and peripancreatic fat stranding. There is heterogeneity and slightly comparative less enhancement of the pancreatic head/uncinate process and proximal body in relation to the rest of the distal pancreas. There is also marked dilation of the main pancreatic duct which measures up to 10-10.5 mm in the pancreatic body region. There are innumerable dilated/prominent pancreatic side branches. The main pancreatic duct gradually tapers before the ampulla of Vater. There are several peripancreatic fluid collections with largest along the anterior aspect of the head measuring up to 1.3 x 2.8 cm (series 6, image 26). The collection exhibits smooth enhancing walls. Overall, the collection has significantly decreased in size since the prior MRI from 01/23/2024. Spleen: Top-normal in size. Normal signal intensity. No focal lesion. Adrenals/Urinary Tract: Unremarkable adrenal glands. No hydroureteronephrosis. No suspicious renal mass. Stomach/Bowel: There is a at least 3.3 x 4.6 cm diverticulum arising from the fundus of  the stomach. Percutaneous gastrojejunostomy tube is seen. Visualized portions within the abdomen are unremarkable. No disproportionate dilation of bowel loops. Vascular/Lymphatic: No pathologically enlarged lymph nodes identified. No abdominal aortic aneurysm demonstrated. No ascites. Other:  None. Musculoskeletal: No suspicious bone lesions identified. IMPRESSION: 1. 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 No discrete obstructing mass or choledocholithiasis seen. There is also marked dilation of the main pancreatic duct which gradually tapers before the Ampulla of Vater. There are innumerable dilated/prominent pancreatic side branches. There are several peripancreatic fluid collections with largest along the anterior aspect of the head measuring up to 1.3 x 2.8 cm. Overall, the collection has significantly decreased in size since the prior MRI from 01/23/2024. 2. There is diffuse interstitial edema of the pancreas with fuzzy margins and peripancreatic fat stranding. There is heterogeneity and slightly comparative less enhancement of the pancreatic head/uncinate process and proximal body in relation to the rest of the distal pancreas. Findings favor acute on chronic pancreatitis.  3. There is a 3.3 x 4.6 cm diverticulum arising from the fundus of the stomach. 4. Multiple other nonacute observations, as described above. Electronically Signed   By: Ree Molt M.D.   On: 03/27/2024 08:31   IR GJ Tube Change Result Date: 03/19/2024 INDICATION: 33 year old male with history of pancreatitis status post percutaneous gastrojejunostomy tube placed on 02/13/2024 presenting with tube [name]. EXAM: FLUOROSCOPIC GUIDED REPLACEMENT OF GASTROJEJUNOSTOMY TUBE COMPARISON:  02/13/2024 MEDICATIONS: None. CONTRAST:  25mL OMNIPAQUE  IOHEXOL  300 MG/ML SOLN, 25mL OMNIPAQUE  IOHEXOL  300 MG/ML SOLN, 25mL OMNIPAQUE  IOHEXOL  300 MG/ML SOLN FLUOROSCOPY TIME:  Seventy-eight mGy  reference air kerma COMPLICATIONS: None. PROCEDURE: Informed written consent was obtained from the patient after a discussion of the risks and benefits. The upper abdomen and the external portion of the existing gastrojejunostomy tube was prepped and draped in the usual sterile fashion, and a sterile drape was applied covering the operative field. Maximum barrier sterile technique with sterile gowns and gloves were used for the procedure. A timeout was performed prior to the initiation of the procedure. Preprocedure radiograph demonstrated coiled position of the jejunal arm within the body of the stomach. The external portion of the catheter was cut to release the retention balloon. A stiff Glidewire was inserted via the gastrostomy port and coiled within the gastric lumen. A 10 French angled sheath was then placed over the wire and used to direct the wire into the duodenum with moderate difficulty. The wire was advanced with the assistance of a Kumpe the catheter coaxially to the proximal jejunum. The catheter and sheath were removed and a new, 18 French balloon retention gastrojejunostomy tube was placed. The catheter tip was unable to be advanced beyond the second portion of the duodenum due to redundancy of the jejunal limb within the stomach. Balloon was inflated with 10 mL of sterile water . Contrast injection via the jejunostomy and gastric lumens confirmed appropriate functioning and positioning. A dressing was placed. The patient tolerated procedure well without immediate postprocedural complication. IMPRESSION: Successful fluoroscopic guided replacement of malpositioned 18 French gastrojejunostomy. The tip of the jejunostomy lumen lies within the second portion of the duodenum. Both lumens ready for immediate use. Ester Sides, MD [facility] Radiology Electronically Signed   By: Ester Sides M.D.   On: 03/19/2024 20:56   DG ERCP Result Date:  03/09/2024 CLINICAL DATA:  Chronic recurrent pancreatitis and pancreatic duct dilatation on MRCP EXAM: ERCP 14 fluoroscopic intra procedural views of the right upper quadrant submitted for interpretation TECHNIQUE: Multiple spot images obtained with the fluoroscopic device and submitted for interpretation post-procedure. FLUOROSCOPY: Radiation Exposure Index (as provided by the fluoroscopic device): 32.09 mGy Kerma COMPARISON:  None Available. FINDINGS: Images demonstrate what appears to be percutaneous enteric tube with retention balloon. Flexible endoscopy device present with minimal contrast injection and no definite advancement into the common bile duct. IMPRESSION: Images of an attempted common bile duct access with minimal contrast injection. These images were submitted for radiologic interpretation only. Please see the procedural report for the amount of contrast and the fluoroscopy time utilized. Electronically Signed   By: Cordella Banner   On: 03/09/2024 16:09   DG C-Arm 1-60 Min-No Report Result Date: 03/09/2024 Fluoroscopy was utilized by the requesting physician.  No radiographic interpretation.   CT ABDOMEN PELVIS W CONTRAST Result Date: 03/04/2024 EXAM: CT ABDOMEN AND PELVIS WITH CONTRAST 03/04/2024 12:59:40 AM TECHNIQUE: CT of the abdomen and pelvis was performed with the administration of intravenous contrast. Multiplanar reformatted images  are provided for review. Automated exposure control, iterative reconstruction, and/or weight-based adjustment of the mA/kV was utilized to reduce the radiation dose to as low as reasonably achievable. 50mL of iohexol  (OMNIPAQUE ) 350 MG/ML injection was used. COMPARISON: None available. CLINICAL HISTORY: Pancreatitis, acute, severe. FINDINGS: LOWER CHEST: No acute abnormality. LIVER: The liver is unremarkable. GALLBLADDER AND BILE DUCTS: Layering hyperdense material within the gallbladder lumen may represent inspissated bile and/or sludge. No superimposed  pericholecystic inflammatory change. PANCREAS: Extensive parenchymal calcifications are seen throughout the head and body of the pancreas in keeping with changes of chronic pancreatitis. The main pancreatic duct is dilated measuring up to 12 mm in diameter within the mid body appears centrally constricted by dense calcification within the pancreatic head. There are extensive peripancreatic inflammatory changes within the pancreatic or duodenal groove and adjacent to the distal body of the stomach. These may relate to changes of acute on chronic pancreatitis or severe gastritis/duodenitis. A loculated fluid collection extending from the head of the pancreas demonstrates a tortuous, lobulated coarse appearance, relatively stable in size since prior examination, measuring 1.5 x 3.5 cm. Two additional poorly circumscribed hyperdense fluid collections are seen adjacent to the Gastric Antrum and first portion of the duodenum, in keeping with peri-duodenal hematoma, measuring up to 2.2 x 4.9 cm and 1.2 x 2.6 cm. STOMACH AND BOWEL: A balloon retention gastrojejunostomy is again identified, however, the jejunal limb of the catheter is now seen looping within the stomach. Extensive circumferential wall thickening and hyperemia involve the distal body of the stomach, Gastric Antrum, and the first portion of the duodenum with obliteration of the enteric lumen in this region. PERITONEUM AND RETROPERITONEUM: Small simple-appearing free fluid is now seen within the pelvis, new since prior examination. No free intraperitoneal gas. Inflammatory changes extend from the duodenal region into the greater omentum surrounding the transverse colon. VASCULATURE: There is marked narrowing of the superior mesenteric vein and central splenic vein at the confluence with the main portal vein, however, these vessels remain patent. LYMPH NODES: No lymphadenopathy. BONES AND SOFT TISSUES: No acute bone abnormality. IMPRESSION: 1. Extensive  parenchymal calcifications in the pancreatic head and body, dilated main pancreatic duct (up to 12 mm) with central constriction by dense calcification, and extensive peripancreatic inflammatory changes, consistent with acute on chronic pancreatitis. 2. Loculated fluid collection (1.5 x 3.5 cm) extending from the pancreatic head, relatively stable in size since prior examination. 3. Two additional early circumscribed hyperdense fluid collections adjacent to the gastric antrum and first portion of the duodenum, consistent with peri-duodenal hematoma, measuring up to 2.2 x 4.9 cm and 1.2 x 2.6 cm. 4. Extensive circumferential wall thickening and hyperemia involving the distal body of the stomach, gastric antrum, and first portion of the duodenum with obliteration of the enteric lumen in this region, consistent with severe gastritis/duodenitis. 5. 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 6. 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 7. Small simple-appearing free fluid within the pelvis, new since prior examination. Electronically signed by: Dorethia Molt MD 03/04/2024 01:18 AM EDT RP Workstation: [id number]    Labs:  CBC: Recent Labs    03/30/24 0900 03/31/24 0423 04/01/24 0150 04/02/24 0107  WBC 5.4 8.3 9.1 8.7  HGB 8.7* 9.1* 8.8* 8.9*  HCT 27.2* 28.9* 27.3* 27.8*  PLT 311 346 357 319    COAGS: Recent Labs  03/30/24 0900 03/31/24 0423 04/01/24 0150 04/02/24 0107  INR 1.9* 2.4* 1.9* 1.2    BMP: Recent Labs    03/30/24 0900 03/31/24 0423 04/01/24 0150 04/02/24 0107  NA 141 138 137 137  K 3.7 4.0 3.8 3.7  CL 106 105 103 102  CO2 26 25 24 27   GLUCOSE 104* 103* 128* 141*  BUN 6 <5* <5* 9  CALCIUM  8.5* 8.6* 8.3* 8.3*  CREATININE 0.66 0.64 0.57* 0.51*  GFRNONAA >60 >60 >60 >60    LIVER FUNCTION  TESTS: Recent Labs    03/25/24 1034 03/28/24 1620 03/31/24 0423 04/02/24 0107  BILITOT 1.9* 0.8 0.9 0.6  AST 115* 65* 33 17  ALT 174* 98* 55* 33  ALKPHOS 1300 Repeated and verified X2.* 628* 426* 296*  PROT 8.3 7.1 6.3* 6.4*  ALBUMIN  4.4 3.4* 3.0* 2.9*    TUMOR MARKERS: Recent Labs    08/26/23 1436  CA199 6    Assessment and Plan:  GJ Tube [name]: [name] [name] is a 33 y.o. male with a history of recurrent pancreatitis, alcohol use disorder, SIBO, and chronic warfarin anticoagulation s/p mechanical aortic valve replacement. Patient initially underwent GJ replacement attempt on 10/20 with Dr. ONEIDA Specking, but was unsuccessful. He was set up for a new placement, but has continued to have an elevated INR leading to delayed replacement of tube.   -INR now down to 1.2 today -WBC remains WNL -NPO at midnight -Ancef  ordered to radiology  -On Heparin  infusion; care order placed to hold at 0600; recommend checking with nursing staff in the morning -Patient requests careful transition to table and positioning due to worsening right shoulder pain -Hospitalist also in the room with request for possible aspiration; discussed with him and patient that this would not likely happen at the same time as the GJ placement and we would need an order to review. All verbalized understanding  -Plan for GJ tube replacement on 10/24 should schedule allow   Risks and benefits image guided gastrojejunal tube placement was discussed with the patient including, but not limited to the need for a barium enema during the procedure, bleeding, infection, peritonitis and/or damage to adjacent structures.  All of the patient's questions were answered, patient is agreeable to proceed.  Consent signed and in chart.   Thank you for allowing our service to participate in [name] [name] 's care.    Electronically Signed: Glennon CHRISTELLA Bal, [name]   04/02/2024, 1:29 PM     I  spent a total of 40 Minutes in face to face in clinical consultation, greater than 50% of which was counseling/coordinating care for GJ tube replacement.

## 2024-04-02 NOTE — Telephone Encounter (Signed)
 I reached out to pt in regards to message below, Pt states forms have been completed by [place] and a letter to his employer is needed at this time.    Copied from CRM #[id number]. Topic: General - Call Back - No Documentation >> Apr 02, 2024 11:24 AM Rea ORN wrote: Reason for CRM: Pt calling to advise that he has been waiting for a week for FMLA paperwork to be filled out from his specialist office and they havent completed it yet. Pt is asking if PCP can write him a note regarding his hospitalization. He said that his employer is requiring it today. Pt is still currently admitted. Please call back

## 2024-04-02 NOTE — Telephone Encounter (Signed)
 [facility] referral placed to Dr Zani. Will perform periodic chart review to insure patient is scheduled.

## 2024-04-02 NOTE — Telephone Encounter (Signed)
 Patient's Aunt came by our office to pick up FMLA letter on behalf of the patient. Informed patient's Aunt that Dr. Albertus is working on the letter and will contact her today with an update. The Aunt expressed that it's really important the letter get to his job today. Informed patient I will go check my computer again to see if Dr. Albertus had responded to the message. Dr. Albertus had messaged me and said the letter was ready for the patient. Printed the letter and handed it to the Aunt of the patient.

## 2024-04-02 NOTE — Evaluation (Signed)
 Occupational Therapy Evaluation Patient Details Name: [name] [name] MRN: [id number] DOB: 01/01/1991 Today's Date: 04/02/2024   History of Present Illness   33 y.o. male with medical history significant of chronic/recurrent pancreatitis, pancreatic pseudocyst, gastric pneumatosis, status post mechanical aortic valve replacement and aortoplasty, hypertension, alcohol use disorder, asthma, and SIBO sent by GI due to concern for GJ tube malfunction, and subsequent R shoulder pain.  MRI Shoulder: Large complex peripherally enhancing fluid collection in the  right lateral chest wall with surrounding soft tissue edema and  enhancement, most consistent with a subacute hematoma.     Clinical Impressions Patient admitted for the diagnosis above.  PTA he lives alone, works full time, and as expected, is independent with all ADL,iADL and mobility.  Patient instructed on upper body ADL per total shoulder arthroplasty techniques to reduce pain.  Patient also given AAROM HEP for his R upper extremity.  Patient instructed that all exercises to be done within pain tolerance, and not to over perform exercises throughout the day.  Instructed to complete light exercises a few times each day.  Patient demonstrates good understanding, and mother in the room for reinforcement.  No further OT needs in the acute setting, and no post acute OT anticipated.  Advised PT that no acute PT needs exist.      If plan is discharge home, recommend the following:         Functional Status Assessment   Patient has not had a recent decline in their functional status     Equipment Recommendations   None recommended by OT     Recommendations for Other Services         Precautions/Restrictions   Precautions Precautions: None Recall of Precautions/Restrictions: Intact Restrictions Weight Bearing Restrictions Per Provider Order: No     Mobility Bed Mobility Overal bed mobility: Independent                   Transfers Overall transfer level: Independent                        Balance Overall balance assessment: No apparent balance deficits (not formally assessed)                                         ADL either performed or assessed with clinical judgement   ADL Overall ADL's : At baseline                                       General ADL Comments: VC's for donn and doff shirt to minimize pain.  Dress R arm first, undress R arm last.     Vision Baseline Vision/History: 1 Wears glasses Patient Visual Report: No change from baseline       Perception Perception: Not tested       Praxis Praxis: Not tested       Pertinent Vitals/Pain Pain Assessment Pain Assessment: Faces Faces Pain Scale: Hurts even more Pain Location: R shoulder Pain Descriptors / Indicators: Sharp, Tender Pain Intervention(s): Monitored during session     Extremity/Trunk Assessment Upper Extremity Assessment Upper Extremity Assessment: RUE deficits/detail;Right hand dominant RUE: Shoulder pain with ROM   Lower Extremity Assessment Lower Extremity Assessment: Overall WFL for tasks assessed   Cervical / Trunk Assessment  Cervical / Trunk Assessment: Normal   Communication Communication Communication: No apparent difficulties   Cognition Arousal: Alert Behavior During Therapy: WFL for tasks assessed/performed Cognition: No apparent impairments                               Following commands: Intact       Cueing  General Comments   Cueing Techniques: Verbal cues      Exercises General Exercises - Upper Extremity Shoulder Flexion: AAROM, Seated Shoulder ABduction: AAROM, Seated Shoulder ADduction: AAROM, Seated   Shoulder Instructions      Home Living Family/patient expects to be discharged to:: Private residence Living Arrangements: Alone Available Help at Discharge: Family;Available  PRN/intermittently Type of Home: House Home Access: Stairs to enter Entrance Stairs-Number of Steps: 4   Home Layout: One level     Bathroom Shower/Tub: Tub/shower unit;Walk-in shower   Bathroom Toilet: Standard     Home Equipment: None          Prior Functioning/Environment Prior Level of Function : Independent/Modified Independent;Working/employed;Driving                    OT Problem List: Pain   OT Treatment/Interventions:        OT Goals(Current goals can be found in the care plan section)   Acute Rehab OT Goals Patient Stated Goal: Eliminate pain. OT Goal Formulation: With patient Time For Goal Achievement: 04/09/24 Potential to Achieve Goals: Good   OT Frequency:       Co-evaluation              AM-PAC OT 6 Clicks Daily Activity     Outcome Measure Help from another person eating meals?: None Help from another person taking care of personal grooming?: None Help from another person toileting, which includes using toliet, bedpan, or urinal?: None Help from another person bathing (including washing, rinsing, drying)?: None Help from another person to put on and taking off regular upper body clothing?: A Little Help from another person to put on and taking off regular lower body clothing?: None 6 Click Score: 23   End of Session Nurse Communication: Mobility status  Activity Tolerance: Patient tolerated treatment well Patient left: in bed;with call bell/phone within reach;with family/visitor present  OT Visit Diagnosis: Pain Pain - Right/Left: Right Pain - part of body: Shoulder;Arm                Time: 8854-8793 OT Time Calculation (min): 21 min Charges:  OT General Charges $OT Visit: 1 Visit OT Evaluation $OT Eval Moderate Complexity: 1 Mod  04/02/2024  RP, OTR/L  Acute Rehabilitation Services  Office:  [phone]   Charlie JONETTA Halsted 04/02/2024, 12:43 PM

## 2024-04-02 NOTE — Progress Notes (Signed)
 Progress Note   Patient: [name] [name] FMW:[id number] DOB: Jan 03, 1991 DOA: 03/28/2024     3 DOS: the patient was seen and examined on 04/02/2024    Assessment and Plan:  #GJ tube malfunction - Patient was sent from GI due to recurrent malfunction.   - IR consulted for placement but unable to replace GJ tube.  G-tube placed in interim.  Plan for replacement of port location for about better anatomical positioning of J-tube. - IR unable to replace G-tube initially due to elevated INR.  Tentative plan for GJ-tube replacement is for Friday as INR has improved - Continue TPN through PICC line - Continue heparin  drip while warfarin is held  # Acute right shoulder pain # Right chest wall fluid collection - Patient complains of right shoulder plain, worse with abduction and internal rotation.  Pain started shortly after admission but prior to placement of PICC line.  Reports trouble sleeping due to pain and needing IV Dilaudid  every 3 hours with symptom relief for only 1 hour. - Right shoulder x-ray showed no acute osseous abnormalities - Continue Voltaren gel, PRN Oxycodone  and IV dilaudid  - Working with OT - MRI right shoulder showed a 3.7 x 3.1 x 3.6 complex, peripherally enhancing fluid collection in the right lateral chest wall involving subscapularis and latissimus dorsi muscles, most consistent with a subacute hematoma, though superimposed infection cannot be excluded - Ordered ultrasound-guided aspiration for diagnostic and possible therapeutic drainage - Send aspirate for Gram stain, culture, and cell count  # Chronic pancreatitis - Currently on tube feeds for nutrition at home.  However, has had recurrent issues and has been without tube feeds since Friday night. - Follows closely with outpatient GI - reportedly informed about the patient via secure chat  #Status post mechanical aortic valve - Currently on heparin  drip while warfarin is held in anticipation of IR  procedure - Monitor INR daily     Subjective: Patient seen at bedside this morning.  Patient's mother is at bedside as well.  INR improved significantly to 1.2 today.  IR PA was at bedside as well obtaining consent for GJ tube replacement procedure tomorrow.  Discussed MRI findings of right chest wall fluid collection.  Patient would like to proceed with possible drainage of fluid collection for symptomatic relief, as he continues to experience significant pain limiting functional range of motion with use of his right arm.  Reports having significant abdominal pain after consuming some clear liquids last night.  Otherwise has been doing fine and worked with OT today.    Physical Exam: Vitals:   04/01/24 2026 04/02/24 0502 04/02/24 0839 04/02/24 1602  BP: 112/73 112/69 109/77 109/75  Pulse: 75 66 64 73  Resp: 18 18 18 18   Temp: 98.6 F (37 C) 98.5 F (36.9 C) 98 F (36.7 C) 98.5 F (36.9 C)  TempSrc: Oral Oral Oral Oral  SpO2: 100% 99% 98% 99%  Weight:      Height:       Physical Exam Constitutional:      General: He is not in acute distress.    Appearance: He is not ill-appearing.  Cardiovascular:     Rate and Rhythm: Normal rate and regular rhythm.     Heart sounds: Normal heart sounds.  Pulmonary:     Effort: Pulmonary effort is normal. No respiratory distress.     Breath sounds: Normal breath sounds. No wheezing.  Abdominal:     Palpations: Abdomen is soft.     Tenderness:  There is abdominal tenderness (mild lower abdominal TTP). There is no guarding.     Comments: G-tube in place, dressing c/d/i  Musculoskeletal:     Right lower leg: No edema.     Left lower leg: No edema.     Comments: RUE PICC line in place. Right shoulder painful on passive and active abduction and internal rotation.   Skin:    General: Skin is warm and dry.     Capillary Refill: Capillary refill takes less than 2 seconds.  Neurological:     Mental Status: He is alert and oriented to person,  place, and time. Mental status is at baseline.     Family Communication: Discussed with patient and his mother at bedside.   Disposition: Status is: Inpatient Remains inpatient appropriate because: pending replacement of GJ-tube by IR and possible drainage of right chest wall fluid collection  Planned Discharge Destination: Home    Time spent: 38 minutes  Author: Veleka Djordjevic Al-Sultani, MD 04/02/2024 7:10 PM  For on call review [email or web address].

## 2024-04-02 NOTE — Telephone Encounter (Signed)
 Letter written and routed to Alan Fusi, CMA

## 2024-04-02 NOTE — Progress Notes (Signed)
 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)  Allergies  Allergen Reactions   Amoxicillin Hives    Patient Measurements: Height: 5' 11 (180.3 cm) Weight: 63.7 kg (140 lb 6.9 oz) IBW/kg (Calculated) : 75.3 HEPARIN  DW (KG): 63.5  Vital Signs: Temp: 98.6 F (37 C) (10/22 2026) Temp Source: Oral (10/22 2026) BP: 112/73 (10/22 2026) Pulse Rate: 75 (10/22 2026)  Labs: Recent Labs    03/30/24 0900 03/31/24 0423 04/01/24 0150 04/01/24 1455 04/02/24 0107  HGB 8.7* 9.1* 8.8*  --  8.9*  HCT 27.2* 28.9* 27.3*  --  27.8*  PLT 311 346 357  --  319  LABPROT 22.6* 27.2* 22.7*  --  15.4*  INR 1.9* 2.4* 1.9*  --  1.2  HEPARINUNFRC  --  0.18*  --  <0.10* <0.10*  CREATININE 0.66 0.64 0.57*  --   --    Estimated Creatinine Clearance: 118.3 mL/min (A) (by C-G formula based on SCr of 0.57 mg/dL (L)).  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 Transitioned to heparin  while warfarin is being held. INR decreased to 1.9 this morning, will continue to trend down.   Heparin  level undetectable on infusion at 1050 units/hr. No issues with line or bleeding reported per RN. Level drawn by IV team with heparin  running peripherally and labs drawn from PICC. INR now less than 1.5  Goal of Therapy:  INR 1.5-2 Heparin  level 0.3-0.7 units/ml Monitor platelets by anticoagulation protocol: Yes   Plan:  Increase heparin  to 1300 units/hr 6 hr heparin  level F/u timing of procedure and if heparin  needs to be held prior-potential  plan for friday  Lynwood Poplar, PharmD, BCPS Clinical Pharmacist 04/02/2024 1:46 AM

## 2024-04-02 NOTE — Progress Notes (Signed)
 PHARMACY - TOTAL PARENTERAL NUTRITION CONSULT NOTE   Indication: intolerance to enteral feeding  Patient Measurements: Height: 5' 11 (180.3 cm) Weight: 63.7 kg (140 lb 6.9 oz) IBW/kg (Calculated) : 75.3 TPN AdjBW (KG): 63.5 Body mass index is 19.59 kg/m.  Assessment:  33 year old male with a medical history significant for chronic pancreatitis, history of aortic valve replacement requiring chronic anticoagulation who was recently admitted on 01/11/2024 for recurrent pancreatitis/pseudocyst. Patient was started on TPN and ultimately had gastrojejunostomy placed for enteral feeds and was discharged home. Readmitted 10/20 for GJ tube malfunction. Interventional radiology unable to replace GJ tube. Patient is unable to restart tube feeds and has not been able to eat many days prior to hospitalization. Report from IR that possible replacement of port location for better anatomical positioning of J-tube perhaps on Wednesday/Friday. Pharmacy consulted to initiate TPN in the interim.   Glucose / Insulin : no hx DM, CBGs < 150; 2 units insulin /24 hours Electrolytes: CoCa 9.18, others wnl Renal: Scr < 1, BUN 9 Hepatic: AST/ALT 33/55, Alb 3.0, Tbili 0.9, Alk Phos 426; TG 103 Intake / Output:  UOP not charted, LBM 10/19  GI Imaging 8/02 CT: severe sequelae of advanced pancreatitis 8/14 MRCP: acute pancreatitis, severe pancreatic ductal dilatation, concerning for ulceration to the bowel lumen 8/18 CT: gas density between the intraluminal gastric fluid and the wall of the stomach in the posterior and lateral portions of the fundus, concerning for pneumatosis 8/21 CT abd shows no evidence of gastric pneumatosis, stable multilocular cystic process anterior to the pancreatic head 8/28 DG abd shows nonobstructive bowel gas pattern   GI Surgeries / Procedures:  08/27 Cortrak placed 09/04 GJ placed  10/09 GJ tube malfunction s/p GJ tube changed  10/20 attempts with IR to reduce the loop in the stomach  was unsuccessful, tube access lost   Central access: 8/20 - 8/29; 10/21 PICC  TPN start date: 8/20 - 8/29; 10/21 >>  Nutritional Goals: Goal TPN rate is 85 mL/hr (provides 102 g of protein and 2303 kcals per day)  RD Assessment: Estimated Needs Total Energy Estimated Needs: 2300-2500 kcal Total Protein Estimated Needs: 100-120 gm Total Fluid Estimated Needs: >2L/day  Current Nutrition:  TPN 10/20 CLD 10/24 NPO for GJ tube replacement  Plan:  Advance TPN to goal 85 mL/hr at 1800,  meeting 100% estimated nutritional needs Electrolytes in TPN: increase Na 100 mEq/L, increase K 50 mEq/L, Ca 3 mEq/L, increase Mg 5 mEq/L, decrease Phos 3 mmol/L. Cl:Ac 1:1 No standard MVI and trace elements inTPN, giving as po Thiamine 100mg  x5d po Continue Sensitive q6h SSI and stop if not needing  Monitor TPN labs daily until stable at goal then on Mon/Thurs F/u GJ replacement and TF start   Thank you for allowing pharmacy to be a part of this patient's care.  Jinnie Door, PharmD, BCPS, BCCP Clinical Pharmacist  Please check AMION for all [facility] Pharmacy phone numbers After 10:00 PM, call Main Pharmacy [phone]

## 2024-04-02 NOTE — Progress Notes (Signed)
 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)  Allergies  Allergen Reactions   Amoxicillin Hives    Patient Measurements: Height: 5' 11 (180.3 cm) Weight: 63.7 kg (140 lb 6.9 oz) IBW/kg (Calculated) : 75.3 HEPARIN  DW (KG): 63.5  Vital Signs: Temp: 98 F (36.7 C) (10/23 0839) Temp Source: Oral (10/23 0839) BP: 109/77 (10/23 0839) Pulse Rate: 64 (10/23 0839)  Labs: Recent Labs    03/31/24 0423 04/01/24 0150 04/01/24 1455 04/02/24 0107 04/02/24 1210  HGB 9.1* 8.8*  --  8.9*  --   HCT 28.9* 27.3*  --  27.8*  --   PLT 346 357  --  319  --   LABPROT 27.2* 22.7*  --  15.4*  --   INR 2.4* 1.9*  --  1.2  --   HEPARINUNFRC 0.18*  --  <0.10* <0.10* 0.16*  CREATININE 0.64 0.57*  --  0.51*  --    Estimated Creatinine Clearance: 118.3 mL/min (A) (by C-G formula based on SCr of 0.51 mg/dL (L)).  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 Transitioned to heparin  while warfarin is being held.   INR is now 1.2.  Heparin  level subtherapeutic @ 0.16 despite dose increase to 1300 units/hr.   Goal of Therapy:  INR 1.5-2 Heparin  level 0.3-0.7 units/ml Monitor platelets by anticoagulation protocol: Yes   Plan:  Increase heparin  to 1450 units/hr 6 hr heparin  level F/u timing of procedure and if heparin  needs to be held prior-potential  plan for friday  Rankin Sams, PharmD, BCPS, BCCCP Clinical Pharmacist

## 2024-04-03 ENCOUNTER — Inpatient Hospital Stay (HOSPITAL_COMMUNITY)

## 2024-04-03 DIAGNOSIS — K9423 Gastrostomy malfunction: Secondary | ICD-10-CM | POA: Diagnosis not present

## 2024-04-03 HISTORY — PX: IR US GUIDE BX ASP/DRAIN: IMG2392

## 2024-04-03 HISTORY — PX: IR GASTROSTOMY TUBE MOD SED: IMG625

## 2024-04-03 LAB — RENAL FUNCTION PANEL
Albumin: 2.7 g/dL — ABNORMAL LOW (ref 3.5–5.0)
Anion gap: 8 (ref 5–15)
BUN: 9 mg/dL (ref 6–20)
CO2: 27 mmol/L (ref 22–32)
Calcium: 8.4 mg/dL — ABNORMAL LOW (ref 8.9–10.3)
Chloride: 103 mmol/L (ref 98–111)
Creatinine, Ser: 0.54 mg/dL — ABNORMAL LOW (ref 0.61–1.24)
GFR, Estimated: >60 mL/min (ref >60)
Glucose, Bld: 137 mg/dL — ABNORMAL HIGH (ref 70–99)
Phosphorus: 3.9 mg/dL (ref 2.5–4.6)
Potassium: 3.8 mmol/L (ref 3.5–5.1)
Sodium: 138 mmol/L (ref 135–145)

## 2024-04-03 LAB — GLUCOSE, CAPILLARY
Glucose-Capillary: 139 mg/dL — ABNORMAL HIGH (ref 70–99)
Glucose-Capillary: 143 mg/dL — ABNORMAL HIGH (ref 70–99)
Glucose-Capillary: 165 mg/dL — ABNORMAL HIGH (ref 70–99)

## 2024-04-03 LAB — CBC
HCT: 26.5 % — ABNORMAL LOW (ref 39.0–52.0)
Hemoglobin: 8.3 g/dL — ABNORMAL LOW (ref 13.0–17.0)
MCH: 27.9 pg (ref 26.0–34.0)
MCHC: 31.3 g/dL (ref 30.0–36.0)
MCV: 89.2 fL (ref 80.0–100.0)
Platelets: 289 K/uL (ref 150–400)
RBC: 2.97 MIL/uL — ABNORMAL LOW (ref 4.22–5.81)
RDW: 14.6 % (ref 11.5–15.5)
WBC: 6.2 K/uL (ref 4.0–10.5)
nRBC: 0 % (ref 0.0–0.2)

## 2024-04-03 LAB — PROTIME-INR
INR: 1 (ref 0.8–1.2)
Prothrombin Time: 14.2 s (ref 11.4–15.2)

## 2024-04-03 LAB — MAGNESIUM: Magnesium: 1.9 mg/dL (ref 1.7–2.4)

## 2024-04-03 LAB — HEPARIN LEVEL (UNFRACTIONATED): Heparin Unfractionated: <0.1 [IU]/mL — ABNORMAL LOW (ref 0.30–0.70)

## 2024-04-03 MED ORDER — LIDOCAINE HCL 1 % IJ SOLN
INTRAMUSCULAR | Status: AC
  Filled 2024-04-03: qty 20

## 2024-04-03 MED ORDER — LIDOCAINE HCL 1 % IJ SOLN
20.0000 mL | Freq: Once | INTRAMUSCULAR | Status: AC
  Administered 2024-04-03: 20 mL
  Filled 2024-04-03: qty 20

## 2024-04-03 MED ORDER — FENTANYL CITRATE (PF) 100 MCG/2ML IJ SOLN
INTRAMUSCULAR | Status: AC
  Filled 2024-04-03: qty 2

## 2024-04-03 MED ORDER — VANCOMYCIN HCL IN DEXTROSE 1-5 GM/200ML-% IV SOLN
INTRAVENOUS | Status: AC
  Filled 2024-04-03: qty 200

## 2024-04-03 MED ORDER — TRAVASOL 10 % IV SOLN
INTRAVENOUS | Status: AC
  Filled 2024-04-03: qty 1020

## 2024-04-03 MED ORDER — DIPHENHYDRAMINE HCL 50 MG/ML IJ SOLN
INTRAMUSCULAR | Status: AC | PRN
  Administered 2024-04-03: 50 mg via INTRAVENOUS

## 2024-04-03 MED ORDER — FENTANYL CITRATE (PF) 100 MCG/2ML IJ SOLN
INTRAMUSCULAR | Status: AC | PRN
  Administered 2024-04-03 (×2): 50 ug via INTRAVENOUS
  Administered 2024-04-03 (×2): 25 ug via INTRAVENOUS

## 2024-04-03 MED ORDER — MIDAZOLAM HCL 2 MG/2ML IJ SOLN
INTRAMUSCULAR | Status: AC
  Filled 2024-04-03: qty 2

## 2024-04-03 MED ORDER — MIDAZOLAM HCL (PF) 2 MG/2ML IJ SOLN
INTRAMUSCULAR | Status: AC | PRN
  Administered 2024-04-03 (×3): 1 mg via INTRAVENOUS

## 2024-04-03 MED ORDER — VANCOMYCIN HCL IN DEXTROSE 1-5 GM/200ML-% IV SOLN
INTRAVENOUS | Status: AC | PRN
Start: 2024-04-03 — End: 2024-04-03
  Administered 2024-04-03: 1000 mg via INTRAVENOUS

## 2024-04-03 MED ORDER — HEPARIN (PORCINE) 25000 UT/250ML-% IV SOLN
1950.0000 [IU]/h | INTRAVENOUS | Status: AC
  Administered 2024-04-03: 1650 [IU]/h via INTRAVENOUS
  Administered 2024-04-04: 1750 [IU]/h via INTRAVENOUS
  Administered 2024-04-05 – 2024-04-06 (×2): 1950 [IU]/h via INTRAVENOUS
  Filled 2024-04-03 (×5): qty 250

## 2024-04-03 MED ORDER — IOHEXOL 300 MG/ML  SOLN
50.0000 mL | Freq: Once | INTRAMUSCULAR | Status: AC | PRN
  Administered 2024-04-03: 30 mL

## 2024-04-03 MED ORDER — MORPHINE SULFATE (PF) 2 MG/ML IV SOLN
1.0000 mg | INTRAVENOUS | Status: DC | PRN
  Administered 2024-04-03 – 2024-04-05 (×7): 1 mg via INTRAVENOUS
  Filled 2024-04-03 (×7): qty 1

## 2024-04-03 MED ORDER — DIPHENHYDRAMINE HCL 50 MG/ML IJ SOLN
INTRAMUSCULAR | Status: AC
Start: 2024-04-03 — End: 2024-04-03
  Filled 2024-04-03: qty 1

## 2024-04-03 MED ORDER — GLUCAGON HCL RDNA (DIAGNOSTIC) 1 MG IJ SOLR
INTRAMUSCULAR | Status: AC | PRN
  Administered 2024-04-03: .5 mg via INTRAVENOUS

## 2024-04-03 MED ORDER — GLUCAGON HCL RDNA (DIAGNOSTIC) 1 MG IJ SOLR
INTRAMUSCULAR | Status: AC
  Filled 2024-04-03: qty 1

## 2024-04-03 NOTE — Plan of Care (Signed)
  Problem: Education: Goal: Knowledge of General Education information will improve Description: Including pain rating scale, medication(s)/side effects and non-pharmacologic comfort measures Outcome: Not Progressing   Problem: Health Behavior/Discharge Planning: Goal: Ability to manage health-related needs will improve Outcome: Not Progressing   Problem: Clinical Measurements: Goal: Ability to maintain clinical measurements within normal limits will improve Outcome: Not Progressing Goal: Will remain free from infection Outcome: Not Progressing Goal: Diagnostic test results will improve Outcome: Not Progressing Goal: Respiratory complications will improve Outcome: Not Progressing Goal: Cardiovascular complication will be avoided Outcome: Not Progressing   Problem: Activity: Goal: Risk for activity intolerance will decrease Outcome: Not Progressing   Problem: Elimination: Goal: Will not experience complications related to bowel motility Outcome: Not Progressing Goal: Will not experience complications related to urinary retention Outcome: Not Progressing   Problem: Pain Managment: Goal: General experience of comfort will improve and/or be controlled Outcome: Not Progressing   Problem: Safety: Goal: Ability to remain free from injury will improve Outcome: Not Progressing

## 2024-04-03 NOTE — Procedures (Signed)
 Interventional Radiology Procedure Note  Procedure:   US  ASP RT AXILLARY COLLECTION    FLUORO 18FR GJ TUBE INSERTION  REMOVAL OF THE OLD GTUBE   Complications: None  Estimated Blood Loss:  MIN  Findings: GJ TUBE CONFIRMED IN THE STOMACH AND PROXIMAL JEJUNUM  10CC OLD DARK BLOOD ASPIRATED FROM RT AXILLARY COLLECTION C/W HEMATOMA CX SENT     EMERSON FREDERIC SPECKING, MD

## 2024-04-03 NOTE — Progress Notes (Signed)
 PHARMACY - TOTAL PARENTERAL NUTRITION CONSULT NOTE   Indication: intolerance to enteral feeding  Patient Measurements: Height: 5' 11 (180.3 cm) Weight: 61.2 kg (134 lb 14.4 oz) IBW/kg (Calculated) : 75.3 TPN AdjBW (KG): 63.5 Body mass index is 18.81 kg/m.  Assessment:  33 year old male with a medical history significant for chronic pancreatitis, history of aortic valve replacement requiring chronic anticoagulation who was recently admitted on 01/11/2024 for recurrent pancreatitis/pseudocyst. Patient was started on TPN and ultimately had gastrojejunostomy placed for enteral feeds and was discharged home. Readmitted 10/20 for GJ tube malfunction. Interventional radiology unable to replace GJ tube. Patient is unable to restart tube feeds and has not been able to eat many days prior to hospitalization. Report from IR that possible replacement of port location for better anatomical positioning of J-tube perhaps on Wednesday/Friday. Pharmacy consulted to initiate TPN in the interim.   Glucose / Insulin : no hx DM, CBGs < 150; 4 units insulin /24 hours Electrolytes: CoCa 9.44, others wnl Renal: Scr < 1, BUN wnl Hepatic: AST/ALT/Tbili  wnl, Alb 2.7, Alk Phos 296; TG 103 Intake / Output:  UOP not charted, LBM 10/19 GI Imaging 8/02 CT: severe sequelae of advanced pancreatitis 8/14 MRCP: acute pancreatitis, severe pancreatic ductal dilatation, concerning for ulceration to the bowel lumen 8/18 CT: gas density between the intraluminal gastric fluid and the wall of the stomach in the posterior and lateral portions of the fundus, concerning for pneumatosis 8/21 CT abd shows no evidence of gastric pneumatosis, stable multilocular cystic process anterior to the pancreatic head 8/28 DG abd shows nonobstructive bowel gas pattern   GI Surgeries / Procedures:  08/27 Cortrak placed 09/04 GJ placed  10/09 GJ tube malfunction s/p GJ tube changed  10/20 attempts with IR to reduce the loop in the stomach was  unsuccessful, tube access lost 10/24 planned GJ tube replacement   Central access: 8/20 - 8/29; 10/21 PICC  TPN start date: 8/20 - 8/29; 10/21 >>  Nutritional Goals: Goal TPN rate is 85 mL/hr (provides 102 g of protein and 2303 kcals per day)  RD Assessment: Estimated Needs Total Energy Estimated Needs: 2300-2500 kcal Total Protein Estimated Needs: 100-120 gm Total Fluid Estimated Needs: >2L/day  Current Nutrition:  TPN 10/20 CLD 10/24 NPO for GJ tube replacement  Plan:  Continue TPN at goal 85 mL/hr, meeting 100% estimated nutritional needs Electrolytes in TPN: increase Na 125 mEq/L, increase K 55 mEq/L, Ca 3 mEq/L, increase Mg 8 mEq/L, Phos 3 mmol/L. Cl:Ac 1:1 No standard MVI and trace elements inTPN, giving as po Thiamine 100mg  x5d po Stop Sensitive q6h SSI and BG checks Monitor TPN labs Mon/Thurs and PRN F/u GJ replacement and TF start   Thank you for allowing pharmacy to be a part of this patient's care.  Jinnie Door, PharmD, BCPS, BCCP Clinical Pharmacist  Please check AMION for all [facility] Pharmacy phone numbers After 10:00 PM, call Main Pharmacy [phone]

## 2024-04-03 NOTE — Progress Notes (Signed)
 Progress Note   Patient: [name] [name] FMW:[id number] DOB: [birth year] DOA: 03/28/2024     4 DOS: the patient was seen and examined on 04/03/2024    Assessment and Plan:  #GJ tube malfunction - Patient was sent from GI due to recurrent malfunction.   - IR consulted for placement but unable to replace GJ tube.  G-tube placed in interim.  Plan for replacement of port location for about better anatomical positioning of J-tube. - IR unable to replace G-tube initially due to elevated INR.  Tentative plan for GJ-tube replacement is for Friday as INR has improved - Underwent placement of 18 French GJ tube by IR on 10/24 - Continue TPN through PICC line - Per IR, resume heparin  drip at 3 PM, warfarin held till tomorrow - Dietician consulted for recommendation for tube feeds on discharge  # Right chest wall hematoma - Complained of right shoulder plain, worse with abduction and internal rotation, noted after admission but prior to placement of PICC line.  Causing trouble sleeping due to pain  - Right shoulder x-ray showed no acute osseous abnormalities - MRI right shoulder showed a 3.7 x 3.1 x 3.6 complex, peripherally enhancing fluid collection in the right lateral chest wall involving subscapularis and latissimus dorsi muscles, most consistent with a subacute hematoma, though superimposed infection cannot be excluded - Underwent US -guided aspiration (therapeutic and diagnostic purposes) on 10/24 with drainage of 10 mL old dark blood consistent with hematoma - Gram stain and culture pending - Working with OT  # Chronic pancreatitis - Currently on tube feeds for nutrition at home.  However, has had recurrent issues and has been without tube feeds since Friday night. - Follows closely with outpatient GI - reportedly informed about the patient via secure chat  #Status post mechanical aortic valve - Currently on heparin  drip while warfarin is held in anticipation of IR procedure -  Monitor INR daily     Subjective: Patient seen at bedside this morning.  Underwent placement of GJ tube and removal of previous G tube, as well as aspiration of right lateral chest wall hematoma. Reports significant improvement if not complete resolution of right shoulder/arm pain. However, unfortunately, he is having significant abdominal pain after placement of the new GJ tube. Appears apprehensive to move due to significance of pain. Denies fevers, chills, nausea, vomiting.    Physical Exam: Vitals:   04/03/24 1010 04/03/24 1015 04/03/24 1020 04/03/24 1030  BP: 119/73 133/84 129/82 121/86  Pulse: 77 71 76 83  Resp: 16 19 16 14   Temp:      TempSrc:      SpO2: 100% 100% 100% 100%  Weight:      Height:       Physical Exam Constitutional:      General: He is not in acute distress.    Appearance: He is not ill-appearing.  Cardiovascular:     Rate and Rhythm: Normal rate and regular rhythm.     Heart sounds: Normal heart sounds.  Pulmonary:     Effort: Pulmonary effort is normal. No respiratory distress.     Breath sounds: Normal breath sounds. No wheezing.  Abdominal:     Palpations: Abdomen is soft.     Tenderness: There is abdominal tenderness (lower abdominal TTP). There is no guarding.     Comments: GJ-tube in place, dressing c/d/i  Musculoskeletal:     Right lower leg: No edema.     Left lower leg: No edema.     Comments:  RUE PICC line in place. Right shoulder painful on passive and active abduction and internal rotation.   Skin:    General: Skin is warm and dry.     Capillary Refill: Capillary refill takes less than 2 seconds.  Neurological:     Mental Status: He is alert and oriented to person, place, and time. Mental status is at baseline.     Family Communication: None at bedside  Disposition: Status is: Inpatient Remains inpatient appropriate because: s/p placement of new GJ-tube, on heparin  drip to bridge warfarin  Planned Discharge Destination:  Home    Time spent: 38 minutes  Author: Duffy Larch, MD 04/03/2024 10:55 AM  For on call review [email or web address].

## 2024-04-03 NOTE — Progress Notes (Signed)
 PHARMACY - ANTICOAGULATION  Pharmacy Consult for Heparin  / Warfarin  Indication: mechanical aortic valve (On-X)  Allergies  Allergen Reactions   Amoxicillin Hives    Patient Measurements: Height: 5' 11 (180.3 cm) Weight: 61.2 kg (134 lb 14.4 oz) IBW/kg (Calculated) : 75.3 HEPARIN  DW (KG): 63.5  Vital Signs: BP: 121/86 (10/24 1030) Pulse Rate: 83 (10/24 1030)  Labs: Recent Labs    04/01/24 0150 04/01/24 1455 04/02/24 0107 04/02/24 1210 04/02/24 2126 04/03/24 0315  HGB 8.8*  --  8.9*  --   --  8.3*  HCT 27.3*  --  27.8*  --   --  26.5*  PLT 357  --  319  --   --  289  LABPROT 22.7*  --  15.4*  --   --  14.2  INR 1.9*  --  1.2  --   --  1.0  HEPARINUNFRC  --    < > <0.10* 0.16* 0.15* <0.10*  CREATININE 0.57*  --  0.51*  --   --  0.54*   < > = values in this interval not displayed.   Estimated Creatinine Clearance: 113.7 mL/min (A) (by C-G formula based on SCr of 0.54 mg/dL (L)).  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 Transitioned to heparin  while warfarin is being held.   INR is now 1.0. S/p GJ insertion and right axillary fluid collection with IR 10/24. Per IR resume heparin  at prior rate with no bolus at 15:00 10/24 and resume warfarin 10/25.   Goal of Therapy:  INR 1.5-2 Heparin  level 0.3-0.7 units/ml Monitor platelets by anticoagulation protocol: Yes   Plan:  Resume heparin  @ 1650 units/hr at 15:00 6 hr heparin  level  Daily INR ordered  Resume warfrain 10/25   Rankin Sams, PharmD, BCPS, BCCCP Clinical Pharmacist

## 2024-04-04 DIAGNOSIS — K9423 Gastrostomy malfunction: Secondary | ICD-10-CM | POA: Diagnosis not present

## 2024-04-04 LAB — CBC
HCT: 28.3 % — ABNORMAL LOW (ref 39.0–52.0)
Hemoglobin: 9 g/dL — ABNORMAL LOW (ref 13.0–17.0)
MCH: 29.8 pg (ref 26.0–34.0)
MCHC: 31.8 g/dL (ref 30.0–36.0)
MCV: 93.7 fL (ref 80.0–100.0)
Platelets: 373 K/uL (ref 150–400)
RBC: 3.02 MIL/uL — ABNORMAL LOW (ref 4.22–5.81)
RDW: 14.7 % (ref 11.5–15.5)
WBC: 6.8 K/uL (ref 4.0–10.5)
nRBC: 0 % (ref 0.0–0.2)

## 2024-04-04 LAB — HEPARIN LEVEL (UNFRACTIONATED)
Heparin Unfractionated: 0.3 [IU]/mL (ref 0.30–0.70)
Heparin Unfractionated: 0.21 [IU]/mL — ABNORMAL LOW (ref 0.30–0.70)
Heparin Unfractionated: 0.34 [IU]/mL (ref 0.30–0.70)

## 2024-04-04 LAB — PROTIME-INR
INR: 1 (ref 0.8–1.2)
Prothrombin Time: 13.9 s (ref 11.4–15.2)

## 2024-04-04 LAB — GLUCOSE, CAPILLARY: Glucose-Capillary: 155 mg/dL — ABNORMAL HIGH (ref 70–99)

## 2024-04-04 MED ORDER — TRAVASOL 10 % IV SOLN
INTRAVENOUS | Status: AC
  Filled 2024-04-04: qty 1020

## 2024-04-04 MED ORDER — WARFARIN - PHARMACIST DOSING INPATIENT
Freq: Every day | Status: DC
  Administered 2024-04-06: 1

## 2024-04-04 MED ORDER — VITAL HP 1.0 CAL PO LIQD: 1000.0000 mL | ORAL | Status: DC

## 2024-04-04 MED ORDER — OSMOLITE 1.5 CAL PO LIQD
1000.0000 mL | ORAL | Status: DC
  Administered 2024-04-04 – 2024-04-05 (×3): 1000 mL
  Filled 2024-04-04 (×5): qty 1000

## 2024-04-04 MED ORDER — WARFARIN SODIUM 6 MG PO TABS
6.0000 mg | ORAL_TABLET | Freq: Once | ORAL | Status: AC
Start: 2024-04-04 — End: 2024-04-04
  Administered 2024-04-04: 6 mg via ORAL
  Filled 2024-04-04: qty 1

## 2024-04-04 NOTE — Progress Notes (Signed)
 PHARMACY - ANTICOAGULATION  Pharmacy Consult for Heparin  / Warfarin  Indication: mechanical aortic valve (On-X)  Allergies  Allergen Reactions   Amoxicillin Hives    Patient Measurements: Height: 5' 11 (180.3 cm) Weight: 61.2 kg (134 lb 14.4 oz) IBW/kg (Calculated) : 75.3 HEPARIN  DW (KG): 63.5  Vital Signs: Temp: 97.9 F (36.6 C) (10/25 2000) Temp Source: Oral (10/25 2000) BP: 122/80 (10/25 2000) Pulse Rate: 92 (10/25 2000)  Labs: Recent Labs    04/02/24 0107 04/02/24 1210 04/03/24 0315 04/04/24 0437 04/04/24 1153 04/04/24 2030  HGB 8.9*  --  8.3*  --  9.0*  --   HCT 27.8*  --  26.5*  --  28.3*  --   PLT 319  --  289  --  373  --   LABPROT 15.4*  --  14.2 13.9  --   --   INR 1.2  --  1.0 1.0  --   --   HEPARINUNFRC <0.10*   < > <0.10* 0.30 0.21* 0.34  CREATININE 0.51*  --  0.54*  --   --   --    < > = values in this interval not displayed.   Estimated Creatinine Clearance: 113.7 mL/min (A) (by C-G formula based on SCr of 0.54 mg/dL (L)).  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 Transitioned to heparin  while warfarin is being held.   INR is now 1.0. S/p GJ insertion and right axillary fluid collection with IR 10/24. Per IR resume heparin  at prior rate with no bolus at 15:00 10/24 and resume warfarin 10/25.  Confirmatory heparin  level subtherapeutic (0.21) on 1750 units/hr. Per RN, no signs/symptoms of bleeding and no issues with infusion.   Consulted to restart warfarin today. Home dose 4.5mg  daily except 6mg  on MW   PM Update: heparin  level therapeutic at 0.34.  Goal of Therapy:  INR 1.5-2 Heparin  level 0.3-0.7 units/ml Monitor platelets by anticoagulation protocol: Yes   Plan:  Continue heparin  1950 units/h Daily heparin  level, CBC  [name] [name], PharmD, BCPS, [facility] Clinical Pharmacist [phone] Please check AMION for all [facility] Pharmacy numbers 04/04/2024

## 2024-04-04 NOTE — Plan of Care (Signed)
   Problem: Education: Goal: Knowledge of General Education information will improve Description: Including pain rating scale, medication(s)/side effects and non-pharmacologic comfort measures Outcome: Progressing   Problem: Health Behavior/Discharge Planning: Goal: Ability to manage health-related needs will improve Outcome: Progressing   Problem: Nutrition: Goal: Adequate nutrition will be maintained Outcome: Progressing

## 2024-04-04 NOTE — Progress Notes (Signed)
 Progress Note   Patient: [name] [name] FMW:[id number] DOB: 1990/12/25 DOA: 03/28/2024     5 DOS: the patient was seen and examined on 04/04/2024  Patient is a 33 year old male with PMHx chronic pancreatitis, pancreatic pseudocyst, gastric pneumatosis, s/p mechanical AV replacement and aortoplasty on warfarin, HTN, alcohol use disorder, SIBO, and asthma who presented to the ED on 03/29/2024 at the request of his gastroenterologist due to concerns for malfunction of his GJ-tube and vomiting of tube feeds.  Assessment and Plan:  #GJ tube malfunction - resolved - Patient was sent from GI due to recurrent malfunction.   - Underwent placement of 18 French GJ tube by IR on 10/24  # Chronic pancreatitis - Currently on tube feeds for nutrition at home.  However, has had recurrent issues and has been without tube feeds since Friday night. - Follows closely with outpatient GI - reportedly informed about the patient via secure chat - Continue creon  TID  - TPN and TF per PharmD and RD  # Right chest wall hematoma - Complained of right shoulder plain, worse with abduction and internal rotation, noted after admission but prior to placement of PICC line.  Causing trouble sleeping due to pain  - Right shoulder x-ray showed no acute osseous abnormalities - MRI right shoulder showed a 3.7 x 3.1 x 3.6 complex, peripherally enhancing fluid collection in the right lateral chest wall involving subscapularis and latissimus dorsi muscles, most consistent with a subacute hematoma, though superimposed infection cannot be excluded - Underwent US -guided aspiration (therapeutic and diagnostic purposes) on 10/24 with drainage of 10 mL old dark blood consistent with hematoma - Gram stain - no organisms, rare WBCs  - culture pending - Working with OT  #Status post mechanical aortic valve - Anticoagulation per PharmD - on heparin  bridge, Warfarin resumed today     Subjective: Patient seen at bedside  this morning. He continues to have significant abdominal pain after placement of the new GJ tube and is apprehensive to move due to significance of pain. Had some difficulty urinating today, stated it is because he does not want to stand up to urinate or use the urinal for fear of making a mess. Denies fevers, chills, nausea, vomiting.    Physical Exam: Vitals:   04/03/24 1532 04/03/24 2000 04/04/24 0937 04/04/24 1714  BP: 123/78 129/87 112/69 114/74  Pulse: 75 83 73 82  Resp: 17 18 18 19   Temp: 98.2 F (36.8 C) 98 F (36.7 C) 98.2 F (36.8 C) 98.4 F (36.9 C)  TempSrc: Oral Oral Oral Oral  SpO2: 100% 100% 100% 100%  Weight:      Height:       Physical Exam Constitutional:      General: He is not in acute distress.    Appearance: He is not ill-appearing.  Cardiovascular:     Rate and Rhythm: Normal rate and regular rhythm.     Heart sounds: Normal heart sounds.  Pulmonary:     Effort: Pulmonary effort is normal. No respiratory distress.     Breath sounds: Normal breath sounds. No wheezing.  Abdominal:     Palpations: Abdomen is soft.     Tenderness: There is abdominal tenderness (lower abdominal TTP). There is no guarding.     Comments: GJ-tube in place, dressing c/d/i  Musculoskeletal:     Right lower leg: No edema.     Left lower leg: No edema.     Comments: RUE PICC line in place. Right shoulder painful on  passive and active abduction and internal rotation.   Skin:    General: Skin is warm and dry.     Capillary Refill: Capillary refill takes less than 2 seconds.  Neurological:     Mental Status: He is alert and oriented to person, place, and time. Mental status is at baseline.     Family Communication: Patient's aunt at bedside  Disposition: Status is: Inpatient Remains inpatient appropriate because: s/p placement of new GJ-tube, on heparin  drip to bridge warfarin  Planned Discharge Destination: Home    DVT Prophylaxis: Heparin  bridge + warfarin  Time  spent: 40 minutes  Author: Ekaterina Denise Al-Sultani, MD 04/04/2024 8:24 PM  For on call review [email or web address].

## 2024-04-04 NOTE — Plan of Care (Signed)

## 2024-04-04 NOTE — Progress Notes (Signed)
 PHARMACY - ANTICOAGULATION  Pharmacy Consult for Heparin  / Warfarin  Indication: mechanical aortic valve (On-X)  Allergies  Allergen Reactions   Amoxicillin Hives    Patient Measurements: Height: 5' 11 (180.3 cm) Weight: 61.2 kg (134 lb 14.4 oz) IBW/kg (Calculated) : 75.3 HEPARIN  DW (KG): 63.5  Vital Signs: Temp: 98.2 F (36.8 C) (10/25 0937) Temp Source: Oral (10/25 0937) BP: 112/69 (10/25 0937) Pulse Rate: 73 (10/25 0937)  Labs: Recent Labs    04/02/24 0107 04/02/24 1210 04/03/24 0315 04/04/24 0437 04/04/24 1153  HGB 8.9*  --  8.3*  --  9.0*  HCT 27.8*  --  26.5*  --  28.3*  PLT 319  --  289  --  373  LABPROT 15.4*  --  14.2 13.9  --   INR 1.2  --  1.0 1.0  --   HEPARINUNFRC <0.10*   < > <0.10* 0.30 0.21*  CREATININE 0.51*  --  0.54*  --   --    < > = values in this interval not displayed.   Estimated Creatinine Clearance: 113.7 mL/min (A) (by C-G formula based on SCr of 0.54 mg/dL (L)).  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 Transitioned to heparin  while warfarin is being held.   INR is now 1.0. S/p GJ insertion and right axillary fluid collection with IR 10/24. Per IR resume heparin  at prior rate with no bolus at 15:00 10/24 and resume warfarin 10/25.  Confirmatory heparin  level subtherapeutic (0.21) on 1750 units/hr. Per RN, no signs/symptoms of bleeding and no issues with infusion.   Consulted to restart warfarin today. Home dose 4.5mg  daily except 6mg  on MW   Goal of Therapy:  INR 1.5-2 Heparin  level 0.3-0.7 units/ml Monitor platelets by anticoagulation protocol: Yes   Plan:  Increase heparin  to 1950 units/hr to stay within goal Warfarin 6mg  Po x 1 today 6 hr heparin  level and CBC Daily INR ordered  Resume warfrain 10/25   Malajah Oceguera A. Lyle, PharmD, BCPS, FNKF Clinical Pharmacist [place] Please utilize Amion for appropriate phone number to reach the unit pharmacist [facility])  04/04/2024 1:41 PM

## 2024-04-04 NOTE — Progress Notes (Signed)
 Nutrition Follow-up  DOCUMENTATION CODES:   Severe malnutrition in context of chronic illness  INTERVENTION:   -TPN management per pharmacy -TF via J-port of GJ tube:  Initiate Osmolite 1.5 @ 20 ml/hr and increase by 10 ml every 8 hours to goal rate of 70 ml/hr.   Tube feeding regimen provides 2520 kcal (100% of needs), 105 grams of protein, and 1280 ml of H2O.    -Once TPN is weaned and TF tolerance is established, transition to home nocturnal feeding regimen:  Osmolite 1.5 @ 105 ml/hr over 16 hours  Regimen provides 2550 kcals, 105 grams protein, and 1280 ml free water  daily  -Continue to monitor bowel function -may be a candidate for Reliazorb to help with absorption due to inefficiency of pancreatic enzymes from pancreatitis  -Continue 100 mg thiamine daily -Continue MVI with minerals daily  NUTRITION DIAGNOSIS:   Severe Malnutrition related to chronic illness (chronic pancreatitis) as evidenced by severe muscle depletion, severe fat depletion, energy intake < or equal to 75% for > or equal to 1 month, percent weight loss (has had 30 lb, 17.5% weight loss in 7 months).  Ongoing  GOAL:   Patient will meet greater than or equal to 90% of their needs, Weight gain  Met with TPN  MONITOR:   PO intake, TF tolerance, Diet advancement, I & O's, Labs  REASON FOR ASSESSMENT:   Consult Enteral/tube feeding initiation and management  ASSESSMENT:   33 y.o Male with PMH of chronic/recurrent pancreatitis, pancreatic pseudocyst, gastric pneumatosis, status post mechanical aortic valve replacement and aortoplasty, HTN, alcohol use disorder, asthma, and SIBO sent by GI due to concern for GJ tube malfunction.  10/20- attempts with IR to reduce the loop in the stomach was unsuccessful, tube access lost  10/21- PICC placed, TPN initiated 10/22- IR unable to place GJ tube secondary to elevated INR 10/24- cultures sent from rt axillary collection consistent with hematoma; G-J tube  placed- placement confirmed in stomach and proximal jejunum  Reviewed I/O's: -500 ml x 24 hours and +2.2 L since admission  UOP: 500 ml x 24 hours  Drain output: 0 ml x 24 hours  Patient unavailable at time of visit. Attempted to speak with patient via call to hospital room phone, however, unable to reach.   Per pharmacy, plan to continue TPN at 85 ml/hr, which provides 2303 kcals and 102 grams protein, meeting 100% of estimated kcal and protein needs.   Case discussed with RN, MD, and pharmacist. RN confirmed that GJ tube was placed by radiology today. Plan to re-start TF today; will start at a low rate with slow titration to ensure tolerance and seamless transition off TPN. Discussed TF orders and recommendations with team.   Patient has been taking some medications PO, however, refusing Creon  at this time.   Reviewed weight history; noted he has experienced a 3.6% weight loss over the past 6 days, which is significant for time frame.  Medications reviewed and include creon , MVI with minerals daily, protonix , and thiamine.   Labs reviewed: K, Mg, and Phos WDL CBGS: 139-143 (inpatient orders for glycemic control are none).    Diet Order:   Diet Order             Diet NPO time specified Except for: Ice Chips  Diet effective now                   EDUCATION NEEDS:   Education needs have been addressed  Skin:  Skin Assessment:  Reviewed RN Assessment  Last BM:  PTA  Height:   Ht Readings from Last 1 Encounters:  03/28/24 5' 11 (1.803 m)    Weight:   Wt Readings from Last 1 Encounters:  04/03/24 61.2 kg    Ideal Body Weight:  78.2 kg  BMI:  Body mass index is 18.81 kg/m.  Estimated Nutritional Needs:   Kcal:  2300-2500 kcal  Protein:  100-120 gm  Fluid:  >2L/day    Margery ORN, RD, LDN, CDCES Registered Dietitian III Certified Diabetes Care and Education Specialist If unable to reach this RD, please use RD Inpatient group chat on secure chat  between hours of 8am-4 pm daily

## 2024-04-04 NOTE — Progress Notes (Signed)
 PHARMACY - TOTAL PARENTERAL NUTRITION CONSULT NOTE   Indication: intolerance to enteral feeding  Patient Measurements: Height: 5' 11 (180.3 cm) Weight: 61.2 kg (134 lb 14.4 oz) IBW/kg (Calculated) : 75.3 TPN AdjBW (KG): 63.5 Body mass index is 18.81 kg/m.  Assessment:  33 year old male with a medical history significant for chronic pancreatitis, history of aortic valve replacement requiring chronic anticoagulation who was recently admitted on 01/11/2024 for recurrent pancreatitis/pseudocyst. Patient was started on TPN and ultimately had gastrojejunostomy placed for enteral feeds and was discharged home. Readmitted 10/20 for GJ tube malfunction. Interventional radiology unable to replace GJ tube. Patient is unable to restart tube feeds and has not been able to eat many days prior to hospitalization. Report from IR that possible replacement of port location for better anatomical positioning of J-tube 10/24. Pharmacy consulted to initiate TPN in the interim.   Glucose / Insulin : no hx DM, CBGs < 150; off ssi  Electrolytes: last labs 10/23: CoCa 9.44, others wnl Renal: Scr < 1, BUN wnl Hepatic: AST/ALT/Tbili  wnl, Alb 2.7, Alk Phos 296; TG 103 Intake / Output:  UOP charted, LBM 10/19 GI Imaging 8/02 CT: severe sequelae of advanced pancreatitis 8/14 MRCP: acute pancreatitis, severe pancreatic ductal dilatation, concerning for ulceration to the bowel lumen 8/18 CT: gas density between the intraluminal gastric fluid and the wall of the stomach in the posterior and lateral portions of the fundus, concerning for pneumatosis 8/21 CT abd shows no evidence of gastric pneumatosis, stable multilocular cystic process anterior to the pancreatic head 8/28 DG abd shows nonobstructive bowel gas pattern   GI Surgeries / Procedures:  08/27 Cortrak placed 09/04 GJ placed  10/09 GJ tube malfunction s/p GJ tube changed  10/20 attempts with IR to reduce the loop in the stomach was unsuccessful, tube  access lost 10/24 GJ tube replacement   Central access: 8/20 - 8/29; 10/21 PICC  TPN start date: 8/20 - 8/29; 10/21 >>  Nutritional Goals: Goal TPN rate is 85 mL/hr (provides 102 g of protein and 2303 kcals per day)  RD Assessment: Estimated Needs Total Energy Estimated Needs: 2300-2500 kcal Total Protein Estimated Needs: 100-120 gm Total Fluid Estimated Needs: >2L/day  Current Nutrition:  TPN 10/20 CLD 10/24 NPO for GJ tube replacement  Plan:  Continue TPN at goal 85 mL/hr, meeting 100% estimated nutritional needs Electrolytes in TPN: Na 125 mEq/L, K 55 mEq/L, Ca 3 mEq/L, Mg 8 mEq/L, Phos 3 mmol/L. Cl:Ac 1:1 No standard MVI and trace elements inTPN, giving as po Thiamine 100mg  x5d po  Monitor TPN labs Mon/Thurs and PRN F/u TF start and wean TPN as able   Thank you for allowing pharmacy to be a part of this patient's care.  Jinnie Door, PharmD, BCPS, BCCP Clinical Pharmacist  Please check AMION for all [facility] Pharmacy phone numbers After 10:00 PM, call Main Pharmacy [phone]

## 2024-04-04 NOTE — Progress Notes (Signed)
 PHARMACY - ANTICOAGULATION  Pharmacy Consult for Heparin  / Warfarin  Indication: mechanical aortic valve (On-X)  Allergies  Allergen Reactions   Amoxicillin Hives    Patient Measurements: Height: 5' 11 (180.3 cm) Weight: 61.2 kg (134 lb 14.4 oz) IBW/kg (Calculated) : 75.3 HEPARIN  DW (KG): 63.5  Vital Signs: Temp: 98 F (36.7 C) (10/24 2000) Temp Source: Oral (10/24 2000) BP: 129/87 (10/24 2000) Pulse Rate: 83 (10/24 2000)  Labs: Recent Labs    04/02/24 0107 04/02/24 1210 04/02/24 2126 04/03/24 0315 04/04/24 0437  HGB 8.9*  --   --  8.3*  --   HCT 27.8*  --   --  26.5*  --   PLT 319  --   --  289  --   LABPROT 15.4*  --   --  14.2 13.9  INR 1.2  --   --  1.0 1.0  HEPARINUNFRC <0.10*   < > 0.15* <0.10* 0.30  CREATININE 0.51*  --   --  0.54*  --    < > = values in this interval not displayed.   Estimated Creatinine Clearance: 113.7 mL/min (A) (by C-G formula based on SCr of 0.54 mg/dL (L)).  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 Transitioned to heparin  while warfarin is being held.   INR is now 1.0. S/p GJ insertion and right axillary fluid collection with IR 10/24. Per IR resume heparin  at prior rate with no bolus at 15:00 10/24 and resume warfarin 10/25.  AM: heparin  level within goal on 1650 units/hr. Per RN, no signs/symptoms of bleeding.  Goal of Therapy:  INR 1.5-2 Heparin  level 0.3-0.7 units/ml Monitor platelets by anticoagulation protocol: Yes   Plan:  Increase heparin  to 1750 units/hr to stay within goal 6 hr confirmatory heparin  level and CBC Daily INR ordered  Resume warfrain 10/25   Lynwood Poplar, PharmD, BCPS Clinical Pharmacist 04/04/2024 5:24 AM

## 2024-04-05 DIAGNOSIS — K9423 Gastrostomy malfunction: Secondary | ICD-10-CM | POA: Diagnosis not present

## 2024-04-05 LAB — CBC
HCT: 28.2 % — ABNORMAL LOW (ref 39.0–52.0)
Hemoglobin: 9.1 g/dL — ABNORMAL LOW (ref 13.0–17.0)
MCH: 28.3 pg (ref 26.0–34.0)
MCHC: 32.3 g/dL (ref 30.0–36.0)
MCV: 87.9 fL (ref 80.0–100.0)
Platelets: 316 K/uL (ref 150–400)
RBC: 3.21 MIL/uL — ABNORMAL LOW (ref 4.22–5.81)
RDW: 14.5 % (ref 11.5–15.5)
WBC: 5.5 K/uL (ref 4.0–10.5)
nRBC: 0 % (ref 0.0–0.2)

## 2024-04-05 LAB — GLUCOSE, CAPILLARY
Glucose-Capillary: 169 mg/dL — ABNORMAL HIGH (ref 70–99)
Glucose-Capillary: 184 mg/dL — ABNORMAL HIGH (ref 70–99)
Glucose-Capillary: 194 mg/dL — ABNORMAL HIGH (ref 70–99)
Glucose-Capillary: 185 mg/dL — ABNORMAL HIGH (ref 70–99)
Glucose-Capillary: 143 mg/dL — ABNORMAL HIGH (ref 70–99)

## 2024-04-05 LAB — HEPARIN LEVEL (UNFRACTIONATED)
Heparin Unfractionated: 0.28 [IU]/mL — ABNORMAL LOW (ref 0.30–0.70)
Heparin Unfractionated: 0.55 [IU]/mL (ref 0.30–0.70)

## 2024-04-05 LAB — PROTIME-INR
INR: 1 (ref 0.8–1.2)
Prothrombin Time: 13.6 s (ref 11.4–15.2)

## 2024-04-05 MED ORDER — WARFARIN SODIUM 4 MG PO TABS
4.5000 mg | ORAL_TABLET | Freq: Once | ORAL | Status: AC
  Administered 2024-04-05: 4.5 mg via ORAL
  Filled 2024-04-05: qty 1

## 2024-04-05 MED ORDER — TRIPLE ANTIBIOTIC 3.5-400-5000 EX OINT
1.0000 | TOPICAL_OINTMENT | CUTANEOUS | Status: AC
  Administered 2024-04-05: 1 via CUTANEOUS
  Filled 2024-04-05: qty 1

## 2024-04-05 MED ORDER — TRIPLE ANTIBIOTIC 3.5-400-5000 EX OINT
1.0000 | TOPICAL_OINTMENT | CUTANEOUS | Status: DC
  Administered 2024-04-05 – 2024-04-06 (×2): 1 via CUTANEOUS
  Filled 2024-04-05 (×4): qty 1

## 2024-04-05 MED ORDER — INSULIN ASPART 100 UNIT/ML IJ SOLN
0.0000 [IU] | Freq: Four times a day (QID) | INTRAMUSCULAR | Status: DC
  Administered 2024-04-05 – 2024-04-06 (×5): 1 [IU] via SUBCUTANEOUS

## 2024-04-05 MED ORDER — INSULIN ASPART 100 UNIT/ML IJ SOLN: 0.0000 [IU] | INTRAMUSCULAR | Status: DC

## 2024-04-05 MED ORDER — TRAVASOL 10 % IV SOLN
INTRAVENOUS | Status: AC
  Filled 2024-04-05: qty 540

## 2024-04-05 NOTE — Progress Notes (Signed)
 Peg tube is bleeding, doctor notified, dressing changed 4 times.

## 2024-04-05 NOTE — Progress Notes (Signed)
 Progress Note   Patient: [name] [name] FMW:[id number] DOB: [birth year] DOA: 03/28/2024     6 DOS: the patient was seen and examined on 04/05/2024  Patient is a 33 year old male with PMHx chronic pancreatitis, pancreatic pseudocyst, gastric pneumatosis, s/p mechanical AV replacement and aortoplasty on warfarin, HTN, alcohol use disorder, SIBO, and asthma who presented to the ED on 03/29/2024 at the request of his gastroenterologist due to concerns for malfunction of his GJ-tube and vomiting of tube feeds.  Assessment and Plan:  #GJ tube malfunction - resolved - Patient was sent from GI due to recurrent malfunction.   - Underwent placement of 18 French GJ tube by IR on 10/24  # Chronic pancreatitis - Currently on tube feeds for nutrition at home.  However, has had recurrent issues and has been without tube feeds since Friday night. - Follows closely with outpatient GI - reportedly informed about the patient via secure chat - Continue creon  TID  - TPN and TF per PharmD and RD  # Right chest wall hematoma - Complained of right shoulder plain, worse with abduction and internal rotation, noted after admission but prior to placement of PICC line.  Causing trouble sleeping due to pain  - Right shoulder x-ray showed no acute osseous abnormalities - MRI right shoulder showed a 3.7 x 3.1 x 3.6 complex, peripherally enhancing fluid collection in the right lateral chest wall involving subscapularis and latissimus dorsi muscles, most consistent with a subacute hematoma, though superimposed infection cannot be excluded - Underwent US -guided aspiration (therapeutic and diagnostic purposes) on 10/24 with drainage of 10 mL old dark blood consistent with hematoma - Gram stain - no organisms, rare WBCs  - Culture pending  #Status post mechanical aortic valve - Anticoagulation per PharmD - on heparin  bridge, Warfarin resumed 10/25 - INR subtherapeutic at 1.0     Subjective: Patient seen  at bedside this morning. Has been getting up and walking around more, using the bathroom despite some pain. Had some superficial bleeding from GJ-tube requiring several dressing changes. Tolerating tube feeds really well. Hopes to be discharged tomorrow to make it to his appointment with [facility] on Tuesday.    Physical Exam: Vitals:   04/04/24 1714 04/04/24 2000 04/05/24 0851 04/05/24 1706  BP: 114/74 122/80 106/65 104/67  Pulse: 82 92 79 83  Resp: 19 18 16 18   Temp: 98.4 F (36.9 C) 97.9 F (36.6 C) 98.6 F (37 C) 98.3 F (36.8 C)  TempSrc: Oral Oral Oral Oral  SpO2: 100% 99% 99% 100%  Weight:      Height:       Physical Exam Constitutional:      General: He is not in acute distress.    Appearance: He is not ill-appearing.  Cardiovascular:     Rate and Rhythm: Normal rate and regular rhythm.     Heart sounds: Normal heart sounds.  Pulmonary:     Effort: Pulmonary effort is normal. No respiratory distress.     Breath sounds: Normal breath sounds. No wheezing.  Abdominal:     Palpations: Abdomen is soft.     Tenderness: There is abdominal tenderness (lower abdominal TTP). There is no guarding.     Comments: GJ-tube in place, dressing c/d/i  Musculoskeletal:     Right lower leg: No edema.     Left lower leg: No edema.     Comments: RUE PICC line in place. Right shoulder painful on passive and active abduction and internal rotation.   Skin:  General: Skin is warm and dry.     Capillary Refill: Capillary refill takes less than 2 seconds.  Neurological:     Mental Status: He is alert and oriented to person, place, and time. Mental status is at baseline.     Family Communication: None at bedside  Disposition: Status is: Inpatient Remains inpatient appropriate because: s/p placement of new GJ-tube, on heparin  drip to bridge warfarin  Planned Discharge Destination: Home    DVT Prophylaxis: Heparin  bridge + warfarin  Time spent: 40 minutes  Author: Laekyn Rayos  Al-Sultani, MD 04/05/2024 10:02 PM  For on call review [email or web address].

## 2024-04-05 NOTE — Progress Notes (Signed)
 PHARMACY - ANTICOAGULATION  Pharmacy Consult for Heparin  / Warfarin  Indication: mechanical aortic valve (On-X)  Allergies  Allergen Reactions   Amoxicillin Hives    Patient Measurements: Height: 5' 11 (180.3 cm) Weight: 61.2 kg (134 lb 14.4 oz) IBW/kg (Calculated) : 75.3 HEPARIN  DW (KG): 63.5  Vital Signs: Temp: 98.6 F (37 C) (10/26 0851) Temp Source: Oral (10/26 0851) BP: 106/65 (10/26 0851) Pulse Rate: 79 (10/26 0851)  Labs: Recent Labs    04/03/24 0315 04/04/24 0437 04/04/24 1153 04/04/24 2030 04/05/24 0326 04/05/24 1122  HGB 8.3*  --  9.0*  --  9.1*  --   HCT 26.5*  --  28.3*  --  28.2*  --   PLT 289  --  373  --  316  --   LABPROT 14.2 13.9  --   --  13.6  --   INR 1.0 1.0  --   --  1.0  --   HEPARINUNFRC <0.10* 0.30 0.21* 0.34 0.28* 0.55  CREATININE 0.54*  --   --   --   --   --    Estimated Creatinine Clearance: 113.7 mL/min (A) (by C-G formula based on SCr of 0.54 mg/dL (L)).  Assessment: 33 yo male with mechanical aortic On-X valve, Coumadin  previously held while awaiting GJ tube replacement. Transitioned to heparin  while warfarin is being held. Now s/p GJ insertion and right axillary fluid collection with IR 10/24. Per IR resume heparin  at prior rate with no bolus at 15:00 10/24 and resume warfarin 10/25.  Home warfarin regimen: has warfarin 3mg  tablets - 1.5 tabs (4.5mg ) daily except 2 tabs (6 mg) on Mon, Wed   Warfarin 10/26: INR today is subtherapeutic at 1.0. Warfarin was restarted on 10/25 and patient received a boosted dose of 6 mg. Will plan to give 4.5 mg tonight and monitor INR trend.  Heparin  10/26: Heparin  level is therapeutic at 0.55. No signs/symptoms of bleeding reported or documented. Hgb 9.1, platelets 316.    Goal of Therapy:  INR 1.5-2 Heparin  level 0.3-0.7 units/ml Monitor platelets by anticoagulation protocol: Yes   Plan:  Continue heparin  at 1950 units/hr 6h heparin  level (until 2 consecutive therapeutic levels, then can  monitor with daily morning labs) Daily heparin  level, CBC, daily INR  Warfarin 4.5 mg x1 dose tonight  Feliciano Close, PharmD PGY2 Infectious Diseases Pharmacy Resident  04/05/2024 12:10 PM

## 2024-04-05 NOTE — Progress Notes (Signed)
 PHARMACY - TOTAL PARENTERAL NUTRITION CONSULT NOTE   Indication: intolerance to enteral feeding  Patient Measurements: Height: 5' 11 (180.3 cm) Weight: 61.2 kg (134 lb 14.4 oz) IBW/kg (Calculated) : 75.3 TPN AdjBW (KG): 63.5 Body mass index is 18.81 kg/m.  Assessment:  33 year old male with a medical history significant for chronic pancreatitis, history of aortic valve replacement requiring chronic anticoagulation who was recently admitted on 01/11/2024 for recurrent pancreatitis/pseudocyst. Patient was started on TPN and ultimately had gastrojejunostomy placed for enteral feeds and was discharged home. Readmitted 10/20 for GJ tube malfunction. Interventional radiology unable to replace GJ tube. Patient is unable to restart tube feeds and has not been able to eat many days prior to hospitalization. Report from IR that possible replacement of port location for better anatomical positioning of J-tube 10/24. Pharmacy consulted to initiate TPN in the interim.   Glucose / Insulin : no hx DM, CBGs < 180; 0 units vsSSI used (Added back for TF coverage)  Electrolytes: last labs 10/23: CoCa 9.44, others wnl Renal: Scr < 1, BUN wnl Hepatic: AST/ALT/Tbili  wnl, Alb 2.7, Alk Phos 296; TG 103 Intake / Output:  UOP charted, LBM 10/19 GI Imaging 8/02 CT: severe sequelae of advanced pancreatitis 8/14 MRCP: acute pancreatitis, severe pancreatic ductal dilatation, concerning for ulceration to the bowel lumen 8/18 CT: gas density between the intraluminal gastric fluid and the wall of the stomach in the posterior and lateral portions of the fundus, concerning for pneumatosis 8/21 CT abd shows no evidence of gastric pneumatosis, stable multilocular cystic process anterior to the pancreatic head 8/28 DG abd shows nonobstructive bowel gas pattern   GI Surgeries / Procedures:  08/27 Cortrak placed 09/04 GJ placed  10/09 GJ tube malfunction s/p GJ tube changed  10/20 attempts with IR to reduce the loop  in the stomach was unsuccessful, tube access lost 10/24 GJ tube replacement   Central access: 8/20 - 8/29; 10/21 PICC  TPN start date: 8/20 - 8/29; 10/21 >>  Nutritional Goals: Goal TPN rate is 85 mL/hr (provides 102 g of protein and 2303 kcals per day)  RD Assessment: Estimated Needs Total Energy Estimated Needs: 2300-2500 kcal Total Protein Estimated Needs: 100-120 gm Total Fluid Estimated Needs: >2L/day  Current Nutrition:  TPN 10/20 CLD 10/24 NPO for GJ tube replacement 10/25 TF @ 20 ml/hr (goal 70 ml/hr) 10/26 TF @ 30 ml/hr - patient is tolerating TF well- minimal nausea, started having flatulence, no bloating, hopes to have a BM today, thinks he'll have no issues advancing     Plan:  Discussed with MD, halve TPN to 45 mL/hr, meeting ~100% estimated needs with tube feeds  Electrolytes in TPN: Na 125 mEq/L, decrease K 80 mEq/L, decrease Ca 3 mEq/L, Mg 15 mEq/L, Phos 6 mmol/L. Cl:Ac 1:1 PO MVI and trace elements   Decrease very sensitive SSI to q6hr and stop if not needing Monitor TPN labs Mon/Thurs and PRN F/u TF tolerance, likely stop TPN 10/27 if doing well on TF  Thank you for allowing pharmacy to be a part of this patient's care.  Jinnie Door, PharmD, BCPS, BCCP Clinical Pharmacist  Please check AMION for all [facility] phone numbers After 10:00 PM, call Main Pharmacy [phone]

## 2024-04-05 NOTE — Progress Notes (Signed)
 Patient is bleeding at G tube site, dressing changed twice

## 2024-04-05 NOTE — Progress Notes (Signed)
 PHARMACY - ANTICOAGULATION  Pharmacy Consult for Heparin  / Warfarin  Indication: mechanical aortic valve (On-X)  Allergies  Allergen Reactions   Amoxicillin Hives    Patient Measurements: Height: 5' 11 (180.3 cm) Weight: 61.2 kg (134 lb 14.4 oz) IBW/kg (Calculated) : 75.3 HEPARIN  DW (KG): 63.5  Vital Signs: Temp: 97.9 F (36.6 C) (10/25 2000) Temp Source: Oral (10/25 2000) BP: 122/80 (10/25 2000) Pulse Rate: 92 (10/25 2000)  Labs: Recent Labs    04/03/24 0315 04/04/24 0437 04/04/24 1153 04/04/24 2030 04/05/24 0326  HGB 8.3*  --  9.0*  --  9.1*  HCT 26.5*  --  28.3*  --  28.2*  PLT 289  --  373  --  316  LABPROT 14.2 13.9  --   --  13.6  INR 1.0 1.0  --   --  1.0  HEPARINUNFRC <0.10* 0.30 0.21* 0.34 0.28*  CREATININE 0.54*  --   --   --   --    Estimated Creatinine Clearance: 113.7 mL/min (A) (by C-G formula based on SCr of 0.54 mg/dL (L)).  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 Transitioned to heparin  while warfarin is being held.   INR is now 1.0. S/p GJ insertion and right axillary fluid collection with IR 10/24. Per IR resume heparin  at prior rate with no bolus at 15:00 10/24 and resume warfarin 10/25.  AM: heparin  level subtherapeutic (0.28) on 1950 units/hr. Per RN, heparin  gtt has not been running continuously with multiple pauses throughout beginning of shift, has since resolved; no signs/symptoms of bleeding. CBC shows Hgb low, stable, plts 316.  Goal of Therapy:  INR 1.5-2 Heparin  level 0.3-0.7 units/ml Monitor platelets by anticoagulation protocol: Yes   Plan:  Continue heparin  at 1950 units/hr 6h heparin  level Daily heparin  level, CBC  Lynwood Poplar, PharmD, BCPS Clinical Pharmacist 04/05/2024 4:52 AM

## 2024-04-06 ENCOUNTER — Telehealth (HOSPITAL_COMMUNITY): Payer: Self-pay

## 2024-04-06 ENCOUNTER — Other Ambulatory Visit (HOSPITAL_COMMUNITY): Payer: Self-pay

## 2024-04-06 DIAGNOSIS — K9423 Gastrostomy malfunction: Secondary | ICD-10-CM | POA: Diagnosis not present

## 2024-04-06 LAB — CBC
HCT: 25.1 % — ABNORMAL LOW (ref 39.0–52.0)
Hemoglobin: 8.1 g/dL — ABNORMAL LOW (ref 13.0–17.0)
MCH: 28.5 pg (ref 26.0–34.0)
MCHC: 32.3 g/dL (ref 30.0–36.0)
MCV: 88.4 fL (ref 80.0–100.0)
Platelets: 307 K/uL (ref 150–400)
RBC: 2.84 MIL/uL — ABNORMAL LOW (ref 4.22–5.81)
RDW: 14.5 % (ref 11.5–15.5)
WBC: 6.3 K/uL (ref 4.0–10.5)
nRBC: 0 % (ref 0.0–0.2)

## 2024-04-06 LAB — COMPREHENSIVE METABOLIC PANEL WITH GFR
ALT: 64 U/L — ABNORMAL HIGH (ref 0–44)
AST: 45 U/L — ABNORMAL HIGH (ref 15–41)
Albumin: 3 g/dL — ABNORMAL LOW (ref 3.5–5.0)
Alkaline Phosphatase: 245 U/L — ABNORMAL HIGH (ref 38–126)
Anion gap: 8 (ref 5–15)
BUN: 16 mg/dL (ref 6–20)
CO2: 28 mmol/L (ref 22–32)
Calcium: 8.7 mg/dL — ABNORMAL LOW (ref 8.9–10.3)
Chloride: 101 mmol/L (ref 98–111)
Creatinine, Ser: 0.53 mg/dL — ABNORMAL LOW (ref 0.61–1.24)
GFR, Estimated: >60 mL/min (ref >60)
Glucose, Bld: 196 mg/dL — ABNORMAL HIGH (ref 70–99)
Potassium: 4.5 mmol/L (ref 3.5–5.1)
Sodium: 137 mmol/L (ref 135–145)
Total Bilirubin: 0.5 mg/dL (ref 0.0–1.2)
Total Protein: 6.6 g/dL (ref 6.5–8.1)

## 2024-04-06 LAB — PROTIME-INR
INR: 1 (ref 0.8–1.2)
Prothrombin Time: 14 s (ref 11.4–15.2)

## 2024-04-06 LAB — TRIGLYCERIDES: Triglycerides: 105 mg/dL (ref <150)

## 2024-04-06 LAB — PHOSPHORUS: Phosphorus: 4.2 mg/dL (ref 2.5–4.6)

## 2024-04-06 LAB — HEPARIN LEVEL (UNFRACTIONATED): Heparin Unfractionated: 0.53 [IU]/mL (ref 0.30–0.70)

## 2024-04-06 LAB — GLUCOSE, CAPILLARY
Glucose-Capillary: 154 mg/dL — ABNORMAL HIGH (ref 70–99)
Glucose-Capillary: 165 mg/dL — ABNORMAL HIGH (ref 70–99)
Glucose-Capillary: 175 mg/dL — ABNORMAL HIGH (ref 70–99)
Glucose-Capillary: 198 mg/dL — ABNORMAL HIGH (ref 70–99)

## 2024-04-06 LAB — MAGNESIUM: Magnesium: 2 mg/dL (ref 1.7–2.4)

## 2024-04-06 MED ORDER — OXYCODONE HCL 5 MG PO TABS
10.0000 mg | ORAL_TABLET | ORAL | Status: DC | PRN
  Administered 2024-04-07 – 2024-04-08 (×6): 10 mg via ORAL
  Filled 2024-04-06 (×6): qty 2

## 2024-04-06 MED ORDER — ENOXAPARIN SODIUM 60 MG/0.6ML IJ SOSY
60.0000 mg | PREFILLED_SYRINGE | Freq: Two times a day (BID) | INTRAMUSCULAR | Status: DC
  Administered 2024-04-06 – 2024-04-08 (×4): 60 mg via SUBCUTANEOUS
  Filled 2024-04-06 (×5): qty 0.6

## 2024-04-06 MED ORDER — WARFARIN SODIUM 6 MG PO TABS
6.0000 mg | ORAL_TABLET | Freq: Once | ORAL | Status: AC
  Administered 2024-04-06: 6 mg via ORAL
  Filled 2024-04-06: qty 1

## 2024-04-06 MED ORDER — OSMOLITE 1.5 CAL PO LIQD
1680.0000 mL | ORAL | Status: DC
  Administered 2024-04-06: 1000 mL
  Filled 2024-04-06: qty 2000
  Filled 2024-04-06: qty 1896
  Filled 2024-04-06 (×2): qty 2000

## 2024-04-06 NOTE — Telephone Encounter (Signed)
 Pharmacy Patient Advocate Encounter  Insurance verification completed.    The patient is insured through [id number]. Patient has [employer], may use a copay card, and/or apply for patient assistance if available.    Ran test claim for Lovenox  60mg  and the current 30 day co-pay is $0.   This test claim was processed through [employer]- copay amounts may vary at other pharmacies due to [employer], or as the patient moves through the different stages of their insurance plan.

## 2024-04-06 NOTE — Progress Notes (Addendum)
 Nutrition Follow-up  DOCUMENTATION CODES:   Severe malnutrition in context of chronic illness  INTERVENTION:  Transition to pt's home tube feed regimen via J port of GJ tube: Osmolite 1.5 at 105 ml/hr x 16 hours from 1300-0500(1680 ml per day)   Provides 2520 kcal, 105 gm protein, 1280 ml free water  daily *Nutren 1.5 is an alternative for Osmolite 1.5 for discharge    Discontinue TPN after current bag Monitor bowel function May be a candidate for Reliazorb to help with absorption due to inefficiency of pancreatic enzymes from pancreatitis.  CLD as tolerated  Creon  with meals and snacks if meals contain fat  *Liquid medicines only through J-tube no crushed medicines as these can clog J -tube   NUTRITION DIAGNOSIS:   Severe Malnutrition related to chronic illness (chronic pancreatitis) as evidenced by severe muscle depletion, severe fat depletion, energy intake < or equal to 75% for > or equal to 1 month, percent weight loss (has had 30 lb, 17.5% weight loss in 7 months). - Ongoing   GOAL:   Patient will meet greater than or equal to 90% of their needs, Weight gain - Meeting via TF  MONITOR:   PO intake, TF tolerance, Diet advancement, I & O's, Labs  REASON FOR ASSESSMENT:   Consult Enteral/tube feeding initiation and management  ASSESSMENT:   33 y.o Male with PMH of chronic/recurrent pancreatitis, pancreatic pseudocyst, gastric pneumatosis, status post mechanical aortic valve replacement and aortoplasty, HTN, alcohol use disorder, asthma, and SIBO sent by GI due to concern for GJ tube malfunction.   9/4 - GJ placed  10/9 - GJ tube malfunction s/p GJ tube changed  10/19 - CT showing GJ tube loops in the stomach and terminates in the proximal duodenum.  10/20 - IR: Attempts were made to reduce the loop in the stomach while maintaining access in the duodenum however this was unsuccessful. Duodenal access was lost 10/21 - TPN initiated  10/24: IR replaced malfunctioning  GJ, new GJ  confirmed in stomach and proximal jejunum  10/25: Tube feeds started via J tube @ 20 ml/hr 10/26 - TPN weaned to 45 ml/hr, Tube feeds @ 40 ml/hr 10/27 - Tube feeds at goal of 70 ml/hr, Transition to home tube feeding regimen   Pt resting in bed, TPN continuing and tube feeds at goal rate of 70 ml//hr via J port of GJ tube. Pt reports he is tolerating his tube feeds however then complains of fullness, hiccups, and nausea. Pt does not endorse nausea from tube feeds rather his nausea is coming from his pain at his GJ site and moving around. GJ site still with bleeding, per pt, has had to have multiple dressing changes.   Pt's [facility] appointment moved to Thursday, pt agreeable to stay inpatient. Will monitor tolerance of 16 hour tube feeding regimen, bowel movements, and bleeding at GJ site. Answered any questions pt had. TPN to be D/C after current bag.   May be a candidate for Reliazorb with tube feeds to help with absorption due to inefficiency of pancreatic enzymes from pancreatitis. Had 1 loose BM this morning. Has not been taking creon  as pt only on CLD with no fat needed to be absorbed.   Home tube feeding regimen via J port of GJ tube: Nutren 1.5 @105  ml x 16 hours via pump  from 1300-0500(1680 ml per day) 30-60 ml FWF before and after tube feeds No additional FWF   Provides: 2520 kcal, 114 gm protein, 1283 ml water  daily  Admit weight: 63.5 kg Current weight: 61.2 kg    Average Meal Intake: CLD  Nutritionally Relevant Medications: Scheduled Meds:  insulin  aspart  0-6 Units Subcutaneous Q6H   lipase/protease/amylase  72,000 Units Oral TID AC   metoprolol  succinate  12.5 mg Oral QHS   multivitamin with minerals  1 tablet Oral Daily   Continuous Infusions:  feeding supplement (OSMOLITE 1.5 CAL)     heparin  1,950 Units/hr (04/06/24 0435)   TPN ADULT (ION) 45 mL/hr at 04/05/24 1751   Labs Reviewed: Creatinine 0.53 AP 245 AST 45 ALT 64 CBG ranges from 165-194  mg/dL over the last 24 hours HgbA1c 4.8  Diet Order:   Diet Order             Diet clear liquid Room service appropriate? Yes; Fluid consistency: Thin  Diet effective now                   EDUCATION NEEDS:   Education needs have been addressed  Skin:  Skin Assessment: Reviewed RN Assessment  Last BM:  10/27 - per pt, type 6/7  Height:   Ht Readings from Last 1 Encounters:  03/28/24 5' 11 (1.803 m)    Weight:   Wt Readings from Last 1 Encounters:  04/03/24 61.2 kg    Ideal Body Weight:  78.2 kg  BMI:  Body mass index is 18.81 kg/m.  Estimated Nutritional Needs:   Kcal:  2300-2500 kcal  Protein:  100-120 gm  Fluid:  >2L/day   Olivia Kenning, RD Registered Dietitian  See Amion for more information

## 2024-04-06 NOTE — Progress Notes (Signed)
 Patient ID: [name] [name], male   DOB: 1991-05-28, 33 y.o.   MRN: [id number]    Referring Physician(s): Dr. Marsha Ada   Supervising Physician: Philip Cornet  Patient Status:  [facility] - In-pt  Chief Complaint:  GJ tube malfunction; s/p new GJ placement 04/03/24 with Dr. Vanice  Subjective:  Pt doing well, some soreness at site when moving, but otherwise pain is easing. Feeds going well and tube functioning properly. Nurse has reported some mild oozing blood around site.  Allergies: Amoxicillin  Medications: Prior to Admission medications   Medication Sig Start Date End Date Taking? Authorizing Provider  acetaminophen  (TYLENOL ) 500 MG tablet Take 1,000 mg by mouth every 6 (six) hours as needed for moderate pain (pain score 4-6).   Yes [provider]  aspirin  EC 81 MG EC tablet Take 1 tablet (81 mg total) by mouth daily. Swallow whole. 10/02/21  Yes Barrett, Erin R, [name]  hydrOXYzine  (ATARAX ) 25 MG tablet Take 1 tablet (25 mg total) by mouth 3 (three) times daily as needed for anxiety. 02/17/24  Yes Sigdel, Santosh, MD  lipase/protease/amylase (CREON ) 36000 UNITS CPEP capsule Take 2 capsules (72,000 Units total) by mouth 3 (three) times daily before meals. 12/09/23  Yes Perri DELENA Meliton Mickey., MD  loratadine  (CLARITIN ) 10 MG tablet Take 10 mg by mouth daily.   Yes [provider]  methocarbamol  (ROBAXIN ) 500 MG tablet Take 1-2 tablets (500-1,000 mg total) by mouth 4 (four) times daily. Patient taking differently: Take 500 mg by mouth daily as needed for muscle spasms. 11/19/23  Yes Lucius Krabbe, NP  metoprolol  succinate (TOPROL  XL) 25 MG 24 hr tablet Take 0.5 tablets (12.5 mg total) by mouth at bedtime. 07/10/23  Yes Thukkani, Arun K, MD  ondansetron  (ZOFRAN -ODT) 4 MG disintegrating tablet Take 4 mg by mouth every 8 (eight) hours as needed. 02/24/24  Yes [provider]  Oxycodone  HCl 10 MG TABS Take 1 tablet (10 mg total) by mouth every 4 (four)  hours as needed for up to 14 days. 03/25/24 04/08/24 Yes Pyrtle, Gordy HERO, MD  pantoprazole  (PROTONIX ) 40 MG tablet Take 1 tablet (40 mg total) by mouth 2 (two) times daily before a meal. 03/09/24 04/08/24 Yes Mansouraty, Aloha Mickey., MD  polyethylene glycol powder (GLYCOLAX /MIRALAX ) 17 GM/SCOOP powder Place 1 capful (17 g) into feeding tube 2 (two) times daily. Patient taking differently: Take 17 g by mouth daily as needed for mild constipation. 02/17/24  Yes Sigdel, Santosh, MD  warfarin (COUMADIN ) 3 MG tablet TAKE 1 & 1/2 TABLETS TO 2 TABLETS BY MOUTH DAILY OR AS DIRECTED BY COAGULATION CLINIC 02/17/24  Yes Mcarthur Pick, MD     Vital Signs: BP 112/74 (BP Location: Left Arm)   Pulse 79   Temp 97.8 F (36.6 C) (Oral)   Resp 18   Ht 5' 11 (1.803 m)   Wt 134 lb 14.4 oz (61.2 kg)   SpO2 100%   BMI 18.81 kg/m   Physical Exam Vitals and nursing note reviewed.  Constitutional:      General: He is not in acute distress. Cardiovascular:     Rate and Rhythm: Normal rate.  Pulmonary:     Effort: Pulmonary effort is normal.  Abdominal:     Palpations: Abdomen is soft.     Comments: + very mild ttp around gtube site. No erythema, purulence. + mild dark clotted blood around retention bumper, no active bleeding seen.  Neurological:     Mental Status: He is alert and  oriented to person, place, and time. Mental status is at baseline.     Imaging: IR GASTROSTOMY TUBE MOD SED Result Date: 04/03/2024 INDICATION: Malpositioned gastrojejunostomy, poor function EXAM: FLUOROSCOPIC PLACEMENT OF A NEW 18 FRENCH BALLOON RETENTION GASTROJEJUNOSTOMY FEEDING TUBE MEDICATIONS: 1 g vancomycin ; Antibiotics were administered within 1 hour of the procedure. Glucagon  0.5 mg IV ANESTHESIA/SEDATION: Moderate (conscious) sedation was employed during this procedure. A total of Versed  2.0 mg and Fentanyl  100 mcg was administered intravenously by the radiology nurse. Total intra-service moderate Sedation Time: 36  minutes. The patient's level of consciousness and vital signs were monitored continuously by radiology nursing throughout the procedure under my direct supervision. CONTRAST:  30 cc-administered into the gastric lumen. FLUOROSCOPY: Radiation Exposure Index (as provided by the fluoroscopic device): 83 mGy Kerma COMPLICATIONS: None immediate. PROCEDURE: Informed written consent was obtained from the patient after a thorough discussion of the procedural risks, benefits and alternatives. All questions were addressed. Maximal Sterile Barrier Technique was utilized including caps, mask, sterile gowns, sterile gloves, sterile drape, hand hygiene and skin antiseptic. A timeout was performed prior to the initiation of the procedure. The stomach was insufflated with air through the existing temporary gastrostomy tube to visualize the stomach under fluoroscopy. The greater curvature of the stomach was localized and marked fluoroscopically. New percutaneous gastrostomy access was performed along the greater curvature to deploy a single T tack. A second puncture was performed with an 18 gauge trocar needle adjacent to the T tack along the greater curvature. Needle position confirmed with fluoroscopy. Contrast injection confirms intragastric position. Amplatz guidewire inserted followed by a 9 French sheath. Kumpe catheter and Glidewire manipulated to direct the access into the proximal jejunum. Contrast injection confirms position in the jejunum. Over an Amplatz guidewire, serial percutaneous tract dilatation performed to 20 French size. Twenty French peel-away sheath inserted. Over a stiff Glidewire, a new 18 French balloon retention gastrostomy tube was easily advanced. Retention balloon inflated with 10 cc saline and retracted against the anterior gastric wall. Contrast injection confirmed position of the gastric port and the jejunal port. Images again obtained for documentation. Catheter secured externally. Saline flush  performed through both ports. The temporary malpositioned gastrostomy tube was deflated and removed after insertion of the new GJ feeding tube. IMPRESSION: Successful fluoroscopic placement of a new 71 French gastrojejunostomy feeding tube as detailed above. Removal of the malpositioned temporary gastrostomy tube. Electronically Signed   By: CHRISTELLA.  Shick M.D.   On: 04/03/2024 11:20   IR US  Guide Bx Asp/Drain Result Date: 04/03/2024 INDICATION: Right axillary fluid collection, pain, concern for abscess or hematoma EXAM: ULTRASOUND ASPIRATION OF THE RIGHT AXILLARY FLUID COLLECTION MEDICATIONS: The patient is currently admitted to the hospital and receiving intravenous antibiotics. The antibiotics were administered within an appropriate time frame prior to the initiation of the procedure. ANESTHESIA/SEDATION: Moderate (conscious) sedation was employed during this procedure. A total of Versed  1 mg and Fentanyl  50 mcg was administered intravenously by the radiology nurse. Total intra-service moderate Sedation Time: 10 minutes. The patient's level of consciousness and vital signs were monitored continuously by radiology nursing throughout the procedure under my direct supervision. COMPLICATIONS: None immediate. PROCEDURE: Informed written consent was obtained from the patient after a thorough discussion of the procedural risks, benefits and alternatives. All questions were addressed. Maximal Sterile Barrier Technique was utilized including caps, mask, sterile gowns, sterile gloves, sterile drape, hand hygiene and skin antiseptic. A timeout was performed prior to the initiation of the procedure. Previous imaging reviewed. Preliminary ultrasound  performed. The right axillary complex fluid collection was localized and marked for aspiration. Under sterile conditions and local anesthesia, an 18 gauge needle was advanced into the collection. Needle position confirmed with ultrasound and maneuvered throughout the collection. 10  cc dark old blood aspirated and more consistent with hematoma. No purulent component. Sample sent for culture. Needle removed. Postprocedure imaging demonstrates no complication. IMPRESSION: Successful ultrasound right axillary fluid collection needle aspiration as above. Electronically Signed   By: CHRISTELLA.  Shick M.D.   On: 04/03/2024 11:14    Labs:  CBC: Recent Labs    04/03/24 0315 04/04/24 1153 04/05/24 0326 04/06/24 0632  WBC 6.2 6.8 5.5 6.3  HGB 8.3* 9.0* 9.1* 8.1*  HCT 26.5* 28.3* 28.2* 25.1*  PLT 289 373 316 307    COAGS: Recent Labs    04/03/24 0315 04/04/24 0437 04/05/24 0326 04/06/24 0632  INR 1.0 1.0 1.0 1.0    BMP: Recent Labs    04/01/24 0150 04/02/24 0107 04/03/24 0315 04/06/24 0632  NA 137 137 138 137  K 3.8 3.7 3.8 4.5  CL 103 102 103 101  CO2 24 27 27 28   GLUCOSE 128* 141* 137* 196*  BUN <5* 9 9 16   CALCIUM  8.3* 8.3* 8.4* 8.7*  CREATININE 0.57* 0.51* 0.54* 0.53*  GFRNONAA >60 >60 >60 >60    LIVER FUNCTION TESTS: Recent Labs    03/28/24 1620 03/31/24 0423 04/02/24 0107 04/03/24 0315 04/06/24 0632  BILITOT 0.8 0.9 0.6  --  0.5  AST 65* 33 17  --  45*  ALT 98* 55* 33  --  64*  ALKPHOS 628* 426* 296*  --  245*  PROT 7.1 6.3* 6.4*  --  6.6  ALBUMIN  3.4* 3.0* 2.9* 2.7* 3.0*    Assessment and Plan:  S/p new GJ placement 04/03/24 - pain easing and feeds tolerated well - site looks well, small amount of clotted blood around retention bumper - pt currently on heparin  gtt + warfarin while awaiting INR to become therapeutic for his mechanical valve - plan to switch to lovenox  + warfarin from hep gtt per hospitalist and pharmacist - no further interventions planned by IR. Please update if needed regarding slow bleeding at gtube site. Anticipated this will improve with medication changes  Please reach out to IR with any further questions or concerns.  Electronically Signed: Kimble VEAR Clas, [name] 04/06/2024, 2:06 PM   I spent a total of 15  Minutes at the the patient's bedside AND on the patient's hospital floor or unit, greater than 50% of which was counseling/coordinating care for GJ tube follow up

## 2024-04-06 NOTE — TOC Progression Note (Signed)
 Transition of Care [facility]) - Progression Note    Patient Details  Name: [name] [name] MRN: [id number] Date of Birth: 11-Sep-1990  Transition of Care [facility]) CM/SW Contact  Roxie KANDICE Stain, RN Phone Number: 04/06/2024, 4:36 PM  Clinical Narrative:    Added new tube feed orders, awaiting MD to co-sign. Notified pam with amerita of new tube feed orders                     Expected Discharge Plan and Services                                               Social Drivers of Health (SDOH) Interventions SDOH Screenings   Food Insecurity: No Food Insecurity (03/29/2024)  Housing: Low Risk  (03/29/2024)  Transportation Needs: No Transportation Needs (03/29/2024)  Utilities: Not At Risk (03/29/2024)  Depression (PHQ2-9): Medium Risk (03/02/2024)  Financial Resource Strain: Low Risk  (04/25/2023)  Physical Activity: Sufficiently Active (04/25/2023)  Social Connections: Unknown (04/25/2023)  Stress: No Stress Concern Present (04/25/2023)  Tobacco Use: Low Risk  (03/31/2024)    Readmission Risk Interventions    01/29/2024   12:53 PM 01/15/2024   11:03 AM  Readmission Risk Prevention Plan  Post Dischage Appt  Complete  Medication Screening  Complete  Transportation Screening Complete Complete  PCP or Specialist Appt within 5-7 Days Complete   Home Care Screening Complete   Medication Review (RN CM) Complete

## 2024-04-06 NOTE — Plan of Care (Signed)
  Problem: Coping: Goal: Ability to adjust to condition or change in health will improve Outcome: Progressing   Problem: Fluid Volume: Goal: Ability to maintain a balanced intake and output will improve Outcome: Progressing   Problem: Health Behavior/Discharge Planning: Goal: Ability to identify and utilize available resources and services will improve Outcome: Progressing   Problem: Skin Integrity: Goal: Risk for impaired skin integrity will decrease Outcome: Progressing   Problem: Nutritional: Goal: Maintenance of adequate nutrition will improve Outcome: Not Progressing

## 2024-04-06 NOTE — Progress Notes (Signed)
 Notified Dr. Mosie of pt is bleeding at peg tube site. MD aware. Sherline Jubilee

## 2024-04-06 NOTE — Progress Notes (Signed)
 Notified MD of patient stating  his pain is not controlled with oxy 5mg . States he takes oxy 10 q4 at home. Patient request restart of home pain medication. MD states he will enter order. [name] [name]

## 2024-04-06 NOTE — Progress Notes (Signed)
 Progress Note   Patient: [name] [name] FMW:[id number] DOB: 10/27/1990 DOA: 03/28/2024     7 DOS: the patient was seen and examined on 04/06/2024  Patient is a 33 year old male with PMHx chronic pancreatitis, pancreatic pseudocyst, gastric pneumatosis, s/p mechanical AV replacement and aortoplasty on warfarin, HTN, alcohol use disorder, SIBO, and asthma who presented to the ED on 03/29/2024 at the request of his gastroenterologist due to concerns for malfunction of his GJ-tube and vomiting of tube feeds.  Assessment and Plan:  #GJ tube malfunction - resolved - Patient was sent from GI due to recurrent malfunction.   - Underwent placement of 18 French GJ tube by IR on 10/24 - Bleeding from GJ tube insertion site requiring multiple dressing changes. IR evaluated and deemed fine to continue to use.   # Chronic pancreatitis - Currently on tube feeds for nutrition at home.  However, has had recurrent issues and has been without tube feeds since Friday night. - Follows closely with outpatient GI - reportedly informed about the patient via secure chat - Continue creon  TID  - TPN off today - TF now on 16 hr nocturnal feed  # Right chest wall hematoma - Complained of right shoulder plain, worse with abduction and internal rotation, noted after admission but prior to placement of PICC line.  Causing trouble sleeping due to pain  - Right shoulder x-ray showed no acute osseous abnormalities - MRI right shoulder showed a 3.7 x 3.1 x 3.6 complex, peripherally enhancing fluid collection in the right lateral chest wall involving subscapularis and latissimus dorsi muscles, most consistent with a subacute hematoma, though superimposed infection cannot be excluded - Underwent US -guided aspiration (therapeutic and diagnostic purposes) on 10/24 with drainage of 10 mL old dark blood consistent with hematoma - Gram stain - no organisms, rare WBCs  - Culture no growth  #Status post mechanical  aortic valve - Anticoagulation per PharmD -  Warfarin resumed 10/25 - INR subtherapeutic at 1.0 - Off heparin  drip today - Started therapeutic Lovenox  bridge as patient might discharge prior to therapeutic Warfarin levels     Subjective: Patient seen at bedside this morning.  Overall doing well.  Noted some oozing from GJ tube site.  Was able to move his [facility] appointment from tomorrow to Thursday in order to trial home tube feed regimen in house prior to discharge.  He is in good spirits about the progress made so far.   Physical Exam: Vitals:   04/06/24 0400 04/06/24 0805 04/06/24 1637 04/06/24 2019  BP: 110/71 112/74 107/68 102/65  Pulse: 81 79 75 90  Resp: 18 18 18 17   Temp: 97.7 F (36.5 C) 97.8 F (36.6 C) 97.8 F (36.6 C) 98.6 F (37 C)  TempSrc: Oral Oral Oral Oral  SpO2: 98% 100% 100% 100%  Weight:      Height:       Physical Exam Constitutional:      General: He is not in acute distress.    Appearance: He is not ill-appearing.  Cardiovascular:     Rate and Rhythm: Normal rate and regular rhythm.     Heart sounds: Normal heart sounds.  Pulmonary:     Effort: Pulmonary effort is normal. No respiratory distress.     Breath sounds: Normal breath sounds. No wheezing.  Abdominal:     Palpations: Abdomen is soft.     Tenderness: There is abdominal tenderness (lower abdominal TTP). There is no guarding.     Comments: GJ-tube in place, clotted  blood noted around GJ tube insertion  Musculoskeletal:     Right lower leg: No edema.     Left lower leg: No edema.     Comments: RUE PICC line in place  Skin:    General: Skin is warm and dry.     Capillary Refill: Capillary refill takes less than 2 seconds.  Neurological:     Mental Status: He is alert and oriented to person, place, and time. Mental status is at baseline.     Family Communication: None at bedside  Disposition: Status is: Inpatient Remains inpatient appropriate because: s/p placement of new GJ-tube,  warfarin subtherapeutic, started on Lovenox  bridge instead of Warfarin in prep for discharge  Planned Discharge Destination: Home    DVT Prophylaxis: Lovenox  bridge + warfarin  Time spent: 40 minutes  Author: Eileen Croswell Al-Sultani, MD 04/06/2024 10:46 PM  For on call review [email or web address].

## 2024-04-06 NOTE — Plan of Care (Signed)
   Problem: Education: Goal: Knowledge of General Education information will improve Description Including pain rating scale, medication(s)/side effects and non-pharmacologic comfort measures Outcome: Progressing

## 2024-04-06 NOTE — Progress Notes (Addendum)
 PHARMACY - ANTICOAGULATION  Pharmacy Consult for Heparin  / Warfarin  Indication: mechanical aortic valve (On-X)  Allergies  Allergen Reactions   Amoxicillin Hives    Patient Measurements: Height: 5' 11 (180.3 cm) Weight: 61.2 kg (134 lb 14.4 oz) IBW/kg (Calculated) : 75.3 HEPARIN  DW (KG): 63.5  Vital Signs: Temp: 97.8 F (36.6 C) (10/27 0805) Temp Source: Oral (10/27 0805) BP: 112/74 (10/27 0805) Pulse Rate: 79 (10/27 0805)  Labs: Recent Labs    04/04/24 0437 04/04/24 0437 04/04/24 1153 04/04/24 2030 04/05/24 0326 04/05/24 1122 04/06/24 0632  HGB  --    < > 9.0*  --  9.1*  --  8.1*  HCT  --   --  28.3*  --  28.2*  --  25.1*  PLT  --   --  373  --  316  --  307  LABPROT 13.9  --   --   --  13.6  --  14.0  INR 1.0  --   --   --  1.0  --  1.0  HEPARINUNFRC 0.30  --  0.21*   < > 0.28* 0.55 0.53  CREATININE  --   --   --   --   --   --  0.53*   < > = values in this interval not displayed.   Estimated Creatinine Clearance: 113.7 mL/min (A) (by C-G formula based on SCr of 0.53 mg/dL (L)).  Assessment: 33 yo male with mechanical aortic On-X valve, Coumadin  previously held while awaiting GJ tube replacement. Transitioned to heparin  while warfarin is being held. Now s/p GJ insertion and right axillary fluid collection with IR 10/24. Per IR resume heparin  at prior rate with no bolus at 15:00 10/24 and resume warfarin 10/25.  Home warfarin regimen: has warfarin 3mg  tablets - 1.5 tabs (4.5mg ) daily except 2 tabs (6 mg) on Mon, Wed   Warfarin 10/27: INR today is subtherapeutic at 1.0.  Day 1 - 6 mg  Day 2 - 4.5 mg  Day 3 - plan for 6 mg as below   Heparin  10/27: Heparin  level is therapeutic at 0.53 (2nd consecutive therapeutic level on current dose). Noted PEG tube site bleeding yesterday PM w/ 4 dressing changes. Hgb 8.1, platelets 307. Per conversation with primary physician today, IR will be paged by RN to evaluate PEG tube bleeding. Patient wants to discharge today. If  so, will transition to lovenox  bridge until INR therapeutic.   Goal of Therapy:  INR 1.5-2 Heparin  level 0.3-0.7 units/ml Monitor platelets by anticoagulation protocol: Yes   Plan:  Continue heparin  at 1950 units/hr for now, likely to transition to lovenox  bridge here / upon discharge pending IR evaluation of PEG tube site as above per discussion with hospitalist  Daily heparin  level, CBC, daily INR  Warfarin 6.0 mg x1 dose tonight, will mimic home regimen as patient has been therapeutic on this regimen prior to recent pause. If discharges today, would recommend he resume his home warfarin regimen.   Rankin Sams, PharmD, BCPS, BCCCP Clinical Pharmacist  Addendum  IR okay with PEG tube site, approved SQ lovenox  for bridging Stop heparin  infusion at 1759 tonight and start lovenox  60 mg q12h at 1800   Rankin Sams, PharmD, BCPS, [facility] Clinical Pharmacist

## 2024-04-07 DIAGNOSIS — K9423 Gastrostomy malfunction: Secondary | ICD-10-CM | POA: Diagnosis not present

## 2024-04-07 LAB — CBC
HCT: 24.3 % — ABNORMAL LOW (ref 39.0–52.0)
Hemoglobin: 7.8 g/dL — ABNORMAL LOW (ref 13.0–17.0)
MCH: 28.4 pg (ref 26.0–34.0)
MCHC: 32.1 g/dL (ref 30.0–36.0)
MCV: 88.4 fL (ref 80.0–100.0)
Platelets: 300 K/uL (ref 150–400)
RBC: 2.75 MIL/uL — ABNORMAL LOW (ref 4.22–5.81)
RDW: 14.5 % (ref 11.5–15.5)
WBC: 5.8 K/uL (ref 4.0–10.5)
nRBC: 0 % (ref 0.0–0.2)

## 2024-04-07 LAB — COMPREHENSIVE METABOLIC PANEL WITH GFR
ALT: 69 U/L — ABNORMAL HIGH (ref 0–44)
AST: 55 U/L — ABNORMAL HIGH (ref 15–41)
Albumin: 3.1 g/dL — ABNORMAL LOW (ref 3.5–5.0)
Alkaline Phosphatase: 225 U/L — ABNORMAL HIGH (ref 38–126)
Anion gap: 8 (ref 5–15)
BUN: 18 mg/dL (ref 6–20)
CO2: 28 mmol/L (ref 22–32)
Calcium: 8.8 mg/dL — ABNORMAL LOW (ref 8.9–10.3)
Chloride: 102 mmol/L (ref 98–111)
Creatinine, Ser: 0.59 mg/dL — ABNORMAL LOW (ref 0.61–1.24)
GFR, Estimated: >60 mL/min (ref >60)
Glucose, Bld: 163 mg/dL — ABNORMAL HIGH (ref 70–99)
Potassium: 4.3 mmol/L (ref 3.5–5.1)
Sodium: 138 mmol/L (ref 135–145)
Total Bilirubin: 0.4 mg/dL (ref 0.0–1.2)
Total Protein: 6.5 g/dL (ref 6.5–8.1)

## 2024-04-07 LAB — GLUCOSE, CAPILLARY
Glucose-Capillary: 113 mg/dL — ABNORMAL HIGH (ref 70–99)
Glucose-Capillary: 142 mg/dL — ABNORMAL HIGH (ref 70–99)
Glucose-Capillary: 146 mg/dL — ABNORMAL HIGH (ref 70–99)
Glucose-Capillary: 94 mg/dL (ref 70–99)
Glucose-Capillary: 99 mg/dL (ref 70–99)

## 2024-04-07 LAB — PROTIME-INR
INR: 1.1 (ref 0.8–1.2)
Prothrombin Time: 14.7 s (ref 11.4–15.2)

## 2024-04-07 MED ORDER — WARFARIN SODIUM 7.5 MG PO TABS
7.5000 mg | ORAL_TABLET | Freq: Once | ORAL | Status: AC
Start: 2024-04-07 — End: 2024-04-07
  Administered 2024-04-07: 7.5 mg via ORAL
  Filled 2024-04-07: qty 1

## 2024-04-07 MED ORDER — OSMOLITE 1.5 CAL PO LIQD
1700.0000 mL | ORAL | Status: DC
  Administered 2024-04-07: 1700 mL
  Filled 2024-04-07 (×2): qty 1896

## 2024-04-07 NOTE — Progress Notes (Addendum)
 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)  Allergies  Allergen Reactions   Amoxicillin Hives    Patient Measurements: Height: 5' 11 (180.3 cm) Weight: 61.2 kg (134 lb 14.4 oz) IBW/kg (Calculated) : 75.3 HEPARIN  DW (KG): 63.5  Vital Signs: Temp: 97.8 F (36.6 C) (10/28 0819) Temp Source: Oral (10/28 0819) BP: 116/67 (10/28 0819) Pulse Rate: 81 (10/28 0819)  Labs: Recent Labs    04/05/24 0326 04/05/24 1122 04/06/24 0632 04/07/24 0051  HGB 9.1*  --  8.1* 7.8*  HCT 28.2*  --  25.1* 24.3*  PLT 316  --  307 300  LABPROT 13.6  --  14.0 14.7  INR 1.0  --  1.0 1.1  HEPARINUNFRC 0.28* 0.55 0.53  --   CREATININE  --   --  0.53*  --    Estimated Creatinine Clearance: 113.7 mL/min (A) (by C-G formula based on SCr of 0.53 mg/dL (L)).  Assessment: 33 yo male with mechanical aortic On-X valve, Coumadin  previously held while awaiting GJ tube replacement. Transitioned to heparin  while warfarin is being held. Now s/p GJ insertion and right axillary fluid collection with IR 10/24. Per IR resume heparin  at prior rate with no bolus at 15:00 10/24 and resume warfarin 10/25.  Home warfarin regimen: has warfarin 3mg  tablets - 1.5 tabs (4.5mg ) daily except 2 tabs (6 mg) on Mon, Wed   Warfarin 10/28: INR today is subtherapeutic at 1.1  Day 1 - 6 mg  Day 2 - 4.5 mg  Day 3 - 6 mg Day 4 - 7.5 mg planned as below   Weekly dose at home = 34.5 mg  Plan for weekly dose ~ 37.5 mg this week, with front loaded higher dosing to get INR back to therapeutic but avoid overshooting.   Goal of Therapy:  INR 1.5-2 Monitor platelets by anticoagulation protocol: Yes   Plan:  Transitioned from heparin  infusion to enoxaparin  60 mg q12h bridge until INR therapeutic  Daily heparin  level, CBC, daily INR  Warfarin 7.5 mg x1 dose tonight, will then mimic home regimen as patient has been therapeutic on this regimen prior to recent pause. If  discharges, would recommend he resume his home warfarin regimen.   Rankin Sams, PharmD, BCPS, BCCCP Clinical Pharmacist

## 2024-04-07 NOTE — Progress Notes (Signed)
 Progress Note   Patient: [name] [name] FMW:[id number] DOB: 25-Dec-1990 DOA: 03/28/2024     8 DOS: the patient was seen and examined on 04/07/2024  Patient is a 33 year old male with PMHx chronic pancreatitis, pancreatic pseudocyst, gastric pneumatosis, s/p mechanical AV replacement and aortoplasty on warfarin, HTN, alcohol use disorder, SIBO, and asthma who presented to the ED on 03/29/2024 at the request of his gastroenterologist due to concerns for malfunction of his GJ-tube and vomiting of tube feeds.  Assessment and Plan:  #GJ tube malfunction - resolved - Patient was sent from GI due to recurrent malfunction.   - Underwent placement of 18 French GJ tube by IR on 10/24 - Bleeding from GJ tube insertion site requiring multiple dressing changes. IR evaluated and deemed fine to continue to use.   #GJ tube dependent - Required TPN while pending GJ tube replacement. Off TPN on 04/06/2024 - Unable to tolerate 16-hour nocturnal tube feeds on 10/27 due to pain, nausea, and fullness.  TF stopped overnight for 4 hours then restarted and was able to tolerate remainder of the feed. - RD recommending changing to 20-hour TF today - If still unable to tolerate 20-hour TF but needs to be discharged by Wednesday 10/29, will have to be discharged on continuous TF  # Chronic pancreatitis - Currently on tube feeds for nutrition at home.  However, has had recurrent issues and has been without tube feeds since Friday night. - Follows closely with outpatient GI - reportedly informed about the patient via secure chat - Has appointment with [facility] on 10/30 to discuss Whipple procedure - Continue creon  TID   # Right chest wall hematoma - Complained of right shoulder plain, worse with abduction and internal rotation, noted after admission but prior to placement of PICC line.  Causing trouble sleeping due to pain  - Right shoulder x-ray showed no acute osseous abnormalities - MRI  right shoulder showed a 3.7 x 3.1 x 3.6 complex, peripherally enhancing fluid collection in the right lateral chest wall involving subscapularis and latissimus dorsi muscles, most consistent with a subacute hematoma, though superimposed infection cannot be excluded - Underwent US -guided aspiration (therapeutic and diagnostic purposes) on 10/24 with drainage of 10 mL old dark blood consistent with hematoma - Gram stain - no organisms, rare WBCs  - Culture no growth  #Status post mechanical aortic valve - Anticoagulation per PharmD -  Warfarin resumed 10/25 - INR subtherapeutic at 1.1 - Off heparin  drip today - Continue therapeutic Lovenox  bridge as patient might discharge prior to therapeutic Warfarin levels     Subjective: Patient seen at bedside this morning. Did not tolerate full 16-hour feed overnight due to nausea, fullness, and abdominal pain. Stopped for 4 hours and then resumed, was okay.  Saw him while getting 20-hour tube feeds, still had some pain  8/10 and needed IV pain meds, which he has been trying hard not to use prior. Aware that he might need to go home on continuous tube feeds if he wants to be discharged on Wednesday.  Physical Exam: Vitals:   04/06/24 2019 04/07/24 0523 04/07/24 0819 04/07/24 0835  BP: 102/65 103/61 116/67 (!) 106/58  Pulse: 90 68 81 75  Resp: 17 18 18 18   Temp: 98.6 F (37 C) 98.9 F (37.2 C) 97.8 F (36.6 C) (!) 97.4 F (36.3 C)  TempSrc: Oral Oral Oral Oral  SpO2: 100% 99% 100% 99%  Weight:      Height:  Physical Exam Constitutional:      General: He is not in acute distress.    Appearance: He is not ill-appearing.  Cardiovascular:     Rate and Rhythm: Normal rate and regular rhythm.     Heart sounds: Normal heart sounds.  Pulmonary:     Effort: Pulmonary effort is normal. No respiratory distress.     Breath sounds: Normal breath sounds. No wheezing.  Abdominal:     Palpations: Abdomen is soft.     Tenderness: There is abdominal  tenderness (lower abdominal TTP). There is no guarding.     Comments: GJ-tube in place, dressing C/D/I. Tube feeds ongoing.  Musculoskeletal:     Right lower leg: No edema.     Left lower leg: No edema.     Comments: RUE PICC line in place  Skin:    General: Skin is warm and dry.     Capillary Refill: Capillary refill takes less than 2 seconds.  Neurological:     Mental Status: He is alert and oriented to person, place, and time. Mental status is at baseline.    Family Communication: None at bedside  Disposition: Status is: Inpatient Remains inpatient appropriate because: s/p placement of new GJ-tube, warfarin subtherapeutic, on Lovenox  bridge instead of Warfarin in prep for discharge, working out tube feeds. Wants to be discharged on Wednesday 10/29  Planned Discharge Destination: Home    DVT Prophylaxis: Lovenox  bridge + warfarin  Time spent: 45 minutes  Author: Gesenia Bantz Al-Sultani, MD 04/07/2024 9:32 AM  For on call review [email or web address].

## 2024-04-07 NOTE — Plan of Care (Signed)

## 2024-04-07 NOTE — TOC Progression Note (Signed)
 Transition of Care [facility]) - Progression Note    Patient Details  Name: [name] [name] MRN: [id number] Date of Birth: April 07, 1991  Transition of [facility]) CM/SW Contact  Marval Gell, RN Phone Number: 04/07/2024, 11:58 AM  Clinical Narrative:     Charlott Lango w [employer] on need feed orders for rate and duration change, same feeds.  Osmolite 1.5 at 85 ml/hr x 20 hours from 1300-0900 (1700 ml per day)                     Expected Discharge Plan and Services                                               Social Drivers of Health (SDOH) Interventions SDOH Screenings   Food Insecurity: No Food Insecurity (03/29/2024)  Housing: Low Risk  (03/29/2024)  Transportation Needs: No Transportation Needs (03/29/2024)  Utilities: Not At Risk (03/29/2024)  Depression (PHQ2-9): Medium Risk (03/02/2024)  Financial Resource Strain: Low Risk  (04/25/2023)  Physical Activity: Sufficiently Active (04/25/2023)  Social Connections: Unknown (04/25/2023)  Stress: No Stress Concern Present (04/25/2023)  Tobacco Use: Low Risk  (03/31/2024)    Readmission Risk Interventions    01/29/2024   12:53 PM 01/15/2024   11:03 AM  Readmission Risk Prevention Plan  Post Dischage Appt  Complete  Medication Screening  Complete  Transportation Screening Complete Complete  PCP or Specialist Appt within 5-7 Days Complete   Home Care Screening Complete   Medication Review (RN CM) Complete

## 2024-04-07 NOTE — Progress Notes (Addendum)
 Nutrition Follow-up  DOCUMENTATION CODES:   Severe malnutrition in context of chronic illness  INTERVENTION:  Pt did not tolerate 16 hour tube feeding regimen, transition to 20 hour tube feeding via J port of GJ tube: Osmolite 1.5 at 85 ml/hr x 20 hours from 1300-0900(1700 ml per day)   Provides 2550 kcal, 106 gm protein, 1295 ml free water  daily *Nutren 1.5 is an alternative for Osmolite 1.5 for discharge    Monitor bowel function May be a candidate for Reliazorb to help with absorption due to inefficiency of pancreatic enzymes from pancreatitis.  CLD as tolerated  Creon  with meals and snacks  Provided pt handout with different times on pump and tube feeding rates for pt to trial at home   *Liquid medicines only through J-tube no crushed medicines as these can clog J -tube   NUTRITION DIAGNOSIS:   Severe Malnutrition related to chronic illness (chronic pancreatitis) as evidenced by severe muscle depletion, severe fat depletion, energy intake < or equal to 75% for > or equal to 1 month, percent weight loss (has had 30 lb, 17.5% weight loss in 7 months). - Ongoing   GOAL:   Patient will meet greater than or equal to 90% of their needs, Weight gain - Progressing   MONITOR:   PO intake, TF tolerance, Diet advancement, I & O's, Labs  REASON FOR ASSESSMENT:   Consult Enteral/tube feeding initiation and management  ASSESSMENT:   33 y.o Male with PMH of chronic/recurrent pancreatitis, pancreatic pseudocyst, gastric pneumatosis, status post mechanical aortic valve replacement and aortoplasty, HTN, alcohol use disorder, asthma, and SIBO sent by GI due to concern for GJ tube malfunction.   9/4 - GJ placed  10/9 - GJ tube malfunction s/p GJ tube changed  10/19 - CT showing GJ tube loops in the stomach and terminates in the proximal duodenum.  10/20 - IR: Attempts were made to reduce the loop in the stomach while maintaining access in the duodenum however this was unsuccessful.  Duodenal access was lost 10/21 - TPN initiated  10/24: IR replaced malfunctioning GJ, new GJ  confirmed in stomach and proximal jejunum  10/25: Tube feeds started via J tube @ 20 ml/hr 10/26 - TPN weaned to 45 ml/hr, Tube feeds @ 40 ml/hr 10/27 - Tube feeds at goal of 70 ml/hr, Transition to home tube feeding regimen, 16 hours  10/28 - Extend time of tube feeds to 20 hours  Pt reports he was unable to tolerate his usual home tube feeding regimen of 16 hrs as he was having feeling of fullness, nausea, and pain that caused him to stop his tube feeds for 4 hours overnight. Tube feeds resumed and extended until 10 am. Since resuming tube feeds pt reports feeling slightly better with mild nausea and pain.   Pt able to tolerate continuous tube feeds at 70 ml/hr.  Discussed nutritional plan with pt including providing different times on pump and varying tube feeding rates for pt to trial at home. Pt agreeable to this plan. Ok to discharge from nutritional standpoint. Notified MD.   Pt will be sent home on 20 hour tube feed regimen at 85 ml/hr. Instructed pt some discomfort is to be expected, if he does not tolerate, stop tube feeds for 2-4 hours and then resume at a lower rate. If he still experiences severe abdominal pain, nausea, or vomiting, contact his PCP or go to the nearest Urgent Care/ED. Pt agreeable to plan.   Pt follows with [facility] and has  [employer] as his DME company. Provided case manager with updated tube feeding regimen.  Admit weight: 63.5 kg Current weight: 61.2 kg    Average Meal Intake: CLD - Drinking juice, and water   Nutritionally Relevant Medications: Scheduled Meds:  insulin  aspart  0-6 Units Subcutaneous Q6H   lipase/protease/amylase  72,000 Units Oral TID AC   metoprolol  succinate  12.5 mg Oral QHS   multivitamin with minerals  1 tablet Oral Daily   Continuous Infusions:  feeding supplement (OSMOLITE 1.5 CAL)     Labs Reviewed: Creatinine 0.59 AP 225 AST 55 ALT  69 CBG ranges from 113-198 mg/dL over the last 24 hours HgbA1c 4.8  Diet Order:   Diet Order             Diet clear liquid Room service appropriate? Yes; Fluid consistency: Thin  Diet effective now                   EDUCATION NEEDS:   Education needs have been addressed  Skin:  Skin Assessment: Reviewed RN Assessment  Last BM:  10/27 - per pt, type 6/7  Height:   Ht Readings from Last 1 Encounters:  03/28/24 5' 11 (1.803 m)    Weight:   Wt Readings from Last 1 Encounters:  04/03/24 61.2 kg    Ideal Body Weight:  78.2 kg  BMI:  Body mass index is 18.81 kg/m.  Estimated Nutritional Needs:   Kcal:  2300-2500 kcal  Protein:  100-120 gm  Fluid:  >2L/day   Olivia Kenning, RD Registered Dietitian  See Amion for more information

## 2024-04-07 NOTE — Discharge Instructions (Addendum)
 Tube feeding Regimen via J port of GJ tube:  You tolerated 24 hours tube feeds at 70 ml/hr but had discomfort when trialed on 16 hours. Below are varying times on the pump with the rate needed to meet your calorie and protein needs. You will be sent home on 20 hours on the pump with a rate of 85 ml/hr. Some discomfort is to be expected however, if you have severe abdominal pain or nausea, stop tube feeds and resume 2-4 hours later with lower rate. If you are still experiencing severe abdominal pain, nausea, or vomiting, stop tube feeds and contact your PCP or go to the nearest urgent care/ED.   24 hours on the pump: Osmolite 1.5/Nutren 1.5 @ 70 ml/hr (1680 ml/day) Provides 2520 kcal, 105 gm protein, 1280 ml free water  daily  20 hours on the pump: Osmolite 1.5/Nutren 1.5 at 85 ml/hr x 20 hours (1700 ml per day) Provides 2550 kcal, 106 gm protein, 1295 ml free water  daily   16 hours on the pump: Osmolite 1.5/Nutren 1.5 at 105 ml/hr x 16 hours (1680 ml per day) Provides 2520 kcal, 105 gm protein, 1280 ml free water  daily  *Flush G and J port of GJ tube with 60 ml water  before and after feeding for tube patency  *Liquid meds only through J -tube

## 2024-04-08 ENCOUNTER — Other Ambulatory Visit (HOSPITAL_COMMUNITY): Payer: Self-pay

## 2024-04-08 DIAGNOSIS — K9423 Gastrostomy malfunction: Secondary | ICD-10-CM | POA: Diagnosis not present

## 2024-04-08 LAB — COMPREHENSIVE METABOLIC PANEL WITH GFR
ALT: 62 U/L — ABNORMAL HIGH (ref 0–44)
AST: 40 U/L (ref 15–41)
Albumin: 3.1 g/dL — ABNORMAL LOW (ref 3.5–5.0)
Alkaline Phosphatase: 197 U/L — ABNORMAL HIGH (ref 38–126)
Anion gap: 7 (ref 5–15)
BUN: 18 mg/dL (ref 6–20)
CO2: 29 mmol/L (ref 22–32)
Calcium: 9 mg/dL (ref 8.9–10.3)
Chloride: 97 mmol/L — ABNORMAL LOW (ref 98–111)
Creatinine, Ser: 0.6 mg/dL — ABNORMAL LOW (ref 0.61–1.24)
GFR, Estimated: >60 mL/min (ref >60)
Glucose, Bld: 160 mg/dL — ABNORMAL HIGH (ref 70–99)
Potassium: 4 mmol/L (ref 3.5–5.1)
Sodium: 133 mmol/L — ABNORMAL LOW (ref 135–145)
Total Bilirubin: 0.3 mg/dL (ref 0.0–1.2)
Total Protein: 6.4 g/dL — ABNORMAL LOW (ref 6.5–8.1)

## 2024-04-08 LAB — CBC
HCT: 24.7 % — ABNORMAL LOW (ref 39.0–52.0)
Hemoglobin: 7.9 g/dL — ABNORMAL LOW (ref 13.0–17.0)
MCH: 27.9 pg (ref 26.0–34.0)
MCHC: 32 g/dL (ref 30.0–36.0)
MCV: 87.3 fL (ref 80.0–100.0)
Platelets: 324 K/uL (ref 150–400)
RBC: 2.83 MIL/uL — ABNORMAL LOW (ref 4.22–5.81)
RDW: 14.5 % (ref 11.5–15.5)
WBC: 8.6 K/uL (ref 4.0–10.5)
nRBC: 0 % (ref 0.0–0.2)

## 2024-04-08 LAB — AEROBIC/ANAEROBIC CULTURE W GRAM STAIN (SURGICAL/DEEP WOUND): Culture: NO GROWTH

## 2024-04-08 LAB — PROTIME-INR
INR: 1.2 (ref 0.8–1.2)
Prothrombin Time: 15.7 s — ABNORMAL HIGH (ref 11.4–15.2)

## 2024-04-08 MED ORDER — OXYCODONE HCL 10 MG PO TABS: 10.0000 mg | ORAL_TABLET | ORAL | 0 refills | Status: AC | PRN

## 2024-04-08 MED ORDER — WARFARIN SODIUM 6 MG PO TABS
6.0000 mg | ORAL_TABLET | Freq: Once | ORAL | Status: DC
  Filled 2024-04-08: qty 1

## 2024-04-08 MED ORDER — ENOXAPARIN (LOVENOX) PATIENT EDUCATION KIT
PACK | Freq: Once | Status: DC
  Filled 2024-04-08: qty 1

## 2024-04-08 MED ORDER — ENOXAPARIN SODIUM 60 MG/0.6ML IJ SOSY: 60.0000 mg | PREFILLED_SYRINGE | Freq: Two times a day (BID) | INTRAMUSCULAR | 0 refills | Status: AC

## 2024-04-08 NOTE — Progress Notes (Signed)
 Patient education provided, picc line removed and waited 45 mins before moving. Lovenox  education provided. Patient feeding materials provided at home . Discharge instructions given in package

## 2024-04-08 NOTE — Progress Notes (Signed)
 PICC Removal Note: PICC line removed from RUE. PICC catheter tip visualized and intact. Pressure held and pressure dressing applied. No redness, ecchymosis, edema, swelling, or drainage noted at site. Instructions provided on post PICC discharge care, including followup notification instructions.  Bedrest until 1145.

## 2024-04-08 NOTE — Progress Notes (Addendum)
 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)  Allergies  Allergen Reactions   Amoxicillin Hives    Patient Measurements: Height: 5' 11 (180.3 cm) Weight: 62.5 kg (137 lb 12.6 oz) IBW/kg (Calculated) : 75.3 HEPARIN  DW (KG): 63.5  Vital Signs: Temp: 98 F (36.7 C) (10/29 0451) Temp Source: Oral (10/29 0451) BP: 98/62 (10/29 0451) Pulse Rate: 65 (10/29 0451)  Labs: Recent Labs    04/05/24 1122 04/06/24 9367 04/06/24 9367 04/07/24 0051 04/07/24 0926 04/08/24 0353  HGB  --  8.1*   < > 7.8*  --  7.9*  HCT  --  25.1*  --  24.3*  --  24.7*  PLT  --  307  --  300  --  324  LABPROT  --  14.0  --  14.7  --  15.7*  INR  --  1.0  --  1.1  --  1.2  HEPARINUNFRC 0.55 0.53  --   --   --   --   CREATININE  --  0.53*  --   --  0.59* 0.60*   < > = values in this interval not displayed.   Estimated Creatinine Clearance: 116.1 mL/min (A) (by C-G formula based on SCr of 0.6 mg/dL (L)).  Assessment: 33 yo male with mechanical aortic On-X valve, Coumadin  previously held while awaiting GJ tube replacement. Transitioned to heparin  while warfarin is being held. Now s/p GJ insertion and right axillary fluid collection with IR 10/24. Per IR resume heparin  at prior rate with no bolus at 15:00 10/24 and resume warfarin 10/25.  Home warfarin regimen: has warfarin 3mg  tablets - 1.5 tabs (4.5mg ) daily except 2 tabs (6 mg) on Mon, Wed   Warfarin  10/28: INR today is subtherapeutic at 1.2  Day 1 - 6 mg  Day 2 - 4.5 mg  Day 3 - 6 mg Day 4 - 7.5 mg  Day 5 - 6 mg planned   Weekly dose at home = 34.5 mg  Plan for weekly dose ~ 39 mg this week, with front loaded higher dosing to get INR back to therapeutic but avoid overshooting.   Goal of Therapy:  INR 1.5-2 Monitor platelets by anticoagulation protocol: Yes   Plan:  Transitioned from heparin  infusion to enoxaparin  60 mg q12h bridge until INR therapeutic  Daily heparin  level, CBC,  daily INR  Warfarin 6 mg x1 dose tonight, mimic home regimen as patient has been therapeutic on this regimen prior to recent pause. If discharges, would recommend he resume his home warfarin regimen.   Rankin Sams, PharmD, BCPS, BCCCP Clinical Pharmacist

## 2024-04-08 NOTE — Discharge Summary (Addendum)
 Physician Discharge Summary   Patient: [name] [name] MRN: [id number] DOB: [birth year]  Admit date:     03/28/2024  Discharge date: 04/08/24  Discharge Physician: Landon BRAVO Jyssica Rief   PCP: Lucius Krabbe, NP   Recommendations at discharge:    Follow up with GI IN 7-10 days  Discharge Diagnoses: Principal Problem:   PEG tube malfunction (HCC)  Resolved Problems:   * No resolved hospital problems. *  Hospital Course: chip is a 33 year old male with PMHx chronic pancreatitis, pancreatic pseudocyst, gastric pneumatosis, s/p mechanical AV replacement and aortoplasty on warfarin, HTN, alcohol use disorder, SIBO, and asthma who presented to the ED on 03/29/2024 at the request of his gastroenterologist due to concerns for malfunction of his GJ-tube and vomiting of tube feeds.   Assessment and Plan:   #GJ tube malfunction - resolved - Patient was sent from GI due to recurrent malfunction.   - Underwent placement of 18 French GJ tube by IR on 10/24 - Bleeding from GJ tube insertion site requiring multiple dressing changes. IR evaluated and deemed fine to continue to use.    #GJ tube dependent - Required TPN while pending GJ tube replacement. Off TPN on 04/06/2024 - Unable to tolerate 16-hour nocturnal tube feeds on 10/27 due to pain, nausea, and fullness.  TF stopped overnight for 4 hours then restarted and was able to tolerate remainder of the feed. - RD recommending changing to 20-hour TF today - If still unable to tolerate 20-hour TF but needs to be discharged by Wednesday 10/29, will have to be discharged on continuous TF   # Chronic pancreatitis - Currently on tube feeds for nutrition at home.  However, has had recurrent issues and has been without tube feeds since Friday night. - Follows closely with outpatient GI - reportedly informed about the patient via secure chat - Has appointment with [facility] on 10/30 to discuss Whipple procedure - Continue  creon  TID    # Right chest wall hematoma - Complained of right shoulder plain, worse with abduction and internal rotation, noted after admission but prior to placement of PICC line.  Causing trouble sleeping due to pain  - Right shoulder x-ray showed no acute osseous abnormalities - MRI right shoulder showed a 3.7 x 3.1 x 3.6 complex, peripherally enhancing fluid collection in the right lateral chest wall involving subscapularis and latissimus dorsi muscles, most consistent with a subacute hematoma, though superimposed infection cannot be excluded - Underwent US -guided aspiration (therapeutic and diagnostic purposes) on 10/24 with drainage of 10 mL old dark blood consistent with hematoma - Gram stain - no organisms, rare WBCs  - Culture no growth   #Status post mechanical aortic valve - Anticoagulation per PharmD -  Warfarin resumed 10/25 - INR subtherapeutic at 1.2 - Off heparin  drip today - Continue therapeutic Lovenox  bridge as patient might discharge prior to therapeutic Warfarin levels     #Severe Malnutrition related to chronic illness (chronic pancreatitis) as evidenced by severe muscle depletion, severe fat depletion, energy intake < or equal to 75% for > or equal to 1 month, percent weight loss (has had 30 lb, 17.5% weight loss in 7 months). - Ongoing        Consultants: IR Procedures performed:   Disposition: Home Diet recommendation:  Discharge Diet Orders (From admission, onward)     Start     Ordered   04/08/24 0000  Diet - low sodium heart healthy        04/08/24 1028  Clear liquid diet DISCHARGE MEDICATION: Allergies as of 04/08/2024       Reactions   Amoxicillin Hives        Medication List     TAKE these medications    acetaminophen  500 MG tablet Commonly known as: TYLENOL  Take 1,000 mg by mouth every 6 (six) hours as needed for moderate pain (pain score 4-6).   aspirin  EC 81 MG tablet Take 1 tablet (81 mg total) by mouth daily.  Swallow whole.   enoxaparin  60 MG/0.6ML injection Commonly known as: LOVENOX  Inject 0.6 mLs (60 mg total) into the skin every 12 (twelve) hours.   hydrOXYzine  25 MG tablet Commonly known as: ATARAX  Take 1 tablet (25 mg total) by mouth 3 (three) times daily as needed for anxiety.   lipase/protease/amylase 63999 UNITS Cpep capsule Commonly known as: Creon  Take 2 capsules (72,000 Units total) by mouth 3 (three) times daily before meals.   loratadine  10 MG tablet Commonly known as: CLARITIN  Take 10 mg by mouth daily.   methocarbamol  500 MG tablet Commonly known as: ROBAXIN  Take 1-2 tablets (500-1,000 mg total) by mouth 4 (four) times daily. What changed:  how much to take when to take this reasons to take this   metoprolol  succinate 25 MG 24 hr tablet Commonly known as: Toprol  XL Take 0.5 tablets (12.5 mg total) by mouth at bedtime.   ondansetron  4 MG disintegrating tablet Commonly known as: ZOFRAN -ODT Take 4 mg by mouth every 8 (eight) hours as needed.   Oxycodone  HCl 10 MG Tabs Take 1 tablet (10 mg total) by mouth every 4 (four) hours as needed for up to 3 days.   pantoprazole  40 MG tablet Commonly known as: Protonix  Take 1 tablet (40 mg total) by mouth 2 (two) times daily before a meal.   polyethylene glycol powder 17 GM/SCOOP powder Commonly known as: GLYCOLAX /MIRALAX  Place 1 capful (17 g) into feeding tube 2 (two) times daily. What changed:  how to take this when to take this reasons to take this   warfarin 3 MG tablet Commonly known as: COUMADIN  Take as directed. If you are unsure how to take this medication, talk to your nurse or doctor. Original instructions: TAKE 1 & 1/2 TABLETS TO 2 TABLETS BY MOUTH DAILY OR AS DIRECTED BY COAGULATION CLINIC               Durable Medical Equipment  (From admission, onward)           Start     Ordered   04/06/24 1310  For home use only DME Tube feeding  Once       Comments: Osmolite 1.5 at 105 ml/hr x 16  hours from 1300-0500(1680 ml per day)   Provides 2520 kcal, 105 gm protein, 1280 ml free water  daily *Nutren 1.5 is an alternative for Osmolite 1.5 for discharge   04/06/24 1310            Follow-up Information     Lucius Krabbe, NP Follow up in 1 week(s).   Specialty: Family Medicine Contact information: [address] [place] [address] [phone]                Discharge Exam: Fredricka Weights   04/01/24 0500 04/03/24 0500 04/08/24 0500  Weight: 63.7 kg 61.2 kg 62.5 kg  Constitutional:      General: He is not in acute distress.    Appearance: He is not ill-appearing.  Cardiovascular:     Rate and Rhythm: Normal rate  and regular rhythm.     Heart sounds: Normal heart sounds.  Pulmonary:     Effort: Pulmonary effort is normal. No respiratory distress.     Breath sounds: Normal breath sounds. No wheezing.  Abdominal:     Palpations: Abdomen is soft.     Tenderness: There is abdominal tenderness (lower abdominal TTP). There is no guarding.     Comments: GJ-tube in place, dressing C/D/I. Tube feeds ongoing.  Musculoskeletal:     Right lower leg: No edema.     Left lower leg: No edema.     Comments: RUE PICC line in place  Skin:    General: Skin is warm and dry.     Capillary Refill: Capillary refill takes less than 2 seconds.  Neurological:     Mental Status: He is alert and oriented to person, place, and time. Mental status is at baseline.      Condition at discharge: stable  The results of significant diagnostics from this hospitalization (including imaging, microbiology, ancillary and laboratory) are listed below for reference.   Imaging Studies: IR GASTROSTOMY TUBE MOD SED Result Date: 04/03/2024 INDICATION: Malpositioned gastrojejunostomy, poor function EXAM: FLUOROSCOPIC PLACEMENT OF A NEW 18 FRENCH BALLOON RETENTION GASTROJEJUNOSTOMY FEEDING TUBE MEDICATIONS: 1 g vancomycin ; Antibiotics were administered within 1 hour of the procedure.  Glucagon  0.5 mg IV ANESTHESIA/SEDATION: Moderate (conscious) sedation was employed during this procedure. A total of Versed  2.0 mg and Fentanyl  100 mcg was administered intravenously by the radiology nurse. Total intra-service moderate Sedation Time: 36 minutes. The patient's level of consciousness and vital signs were monitored continuously by radiology nursing throughout the procedure under my direct supervision. CONTRAST:  30 cc-administered into the gastric lumen. FLUOROSCOPY: Radiation Exposure Index (as provided by the fluoroscopic device): 83 mGy Kerma COMPLICATIONS: None immediate. PROCEDURE: Informed written consent was obtained from the patient after a thorough discussion of the procedural risks, benefits and alternatives. All questions were addressed. Maximal Sterile Barrier Technique was utilized including caps, mask, sterile gowns, sterile gloves, sterile drape, hand hygiene and skin antiseptic. A timeout was performed prior to the initiation of the procedure. The stomach was insufflated with air through the existing temporary gastrostomy tube to visualize the stomach under fluoroscopy. The greater curvature of the stomach was localized and marked fluoroscopically. New percutaneous gastrostomy access was performed along the greater curvature to deploy a single T tack. A second puncture was performed with an 18 gauge trocar needle adjacent to the T tack along the greater curvature. Needle position confirmed with fluoroscopy. Contrast injection confirms intragastric position. Amplatz guidewire inserted followed by a 9 French sheath. Kumpe catheter and Glidewire manipulated to direct the access into the proximal jejunum. Contrast injection confirms position in the jejunum. Over an Amplatz guidewire, serial percutaneous tract dilatation performed to 20 French size. Twenty French peel-away sheath inserted. Over a stiff Glidewire, a new 18 French balloon retention gastrostomy tube was easily advanced.  Retention balloon inflated with 10 cc saline and retracted against the anterior gastric wall. Contrast injection confirmed position of the gastric port and the jejunal port. Images again obtained for documentation. Catheter secured externally. Saline flush performed through both ports. The temporary malpositioned gastrostomy tube was deflated and removed after insertion of the new GJ feeding tube. IMPRESSION: Successful fluoroscopic placement of a new 30 French gastrojejunostomy feeding tube as detailed above. Removal of the malpositioned temporary gastrostomy tube. Electronically Signed   By: CHRISTELLA.  Shick M.D.   On: 04/03/2024 11:20   IR US  Guide Bx  Asp/Drain Result Date: 04/03/2024 INDICATION: Right axillary fluid collection, pain, concern for abscess or hematoma EXAM: ULTRASOUND ASPIRATION OF THE RIGHT AXILLARY FLUID COLLECTION MEDICATIONS: The patient is currently admitted to the hospital and receiving intravenous antibiotics. The antibiotics were administered within an appropriate time frame prior to the initiation of the procedure. ANESTHESIA/SEDATION: Moderate (conscious) sedation was employed during this procedure. A total of Versed  1 mg and Fentanyl  50 mcg was administered intravenously by the radiology nurse. Total intra-service moderate Sedation Time: 10 minutes. The patient's level of consciousness and vital signs were monitored continuously by radiology nursing throughout the procedure under my direct supervision. COMPLICATIONS: None immediate. PROCEDURE: Informed written consent was obtained from the patient after a thorough discussion of the procedural risks, benefits and alternatives. All questions were addressed. Maximal Sterile Barrier Technique was utilized including caps, mask, sterile gowns, sterile gloves, sterile drape, hand hygiene and skin antiseptic. A timeout was performed prior to the initiation of the procedure. Previous imaging reviewed. Preliminary ultrasound performed. The right  axillary complex fluid collection was localized and marked for aspiration. Under sterile conditions and local anesthesia, an 18 gauge needle was advanced into the collection. Needle position confirmed with ultrasound and maneuvered throughout the collection. 10 cc dark old blood aspirated and more consistent with hematoma. No purulent component. Sample sent for culture. Needle removed. Postprocedure imaging demonstrates no complication. IMPRESSION: Successful ultrasound right axillary fluid collection needle aspiration as above. Electronically Signed   By: CHRISTELLA.  Shick M.D.   On: 04/03/2024 11:14   MR SHOULDER RIGHT W WO CONTRAST Result Date: 04/02/2024 CLINICAL DATA:  Right shoulder pain. History of long-term anticoagulant use, previous aortic valve replacement and chronic pancreatitis. EXAM: MRI OF THE RIGHT SHOULDER WITHOUT AND WITH CONTRAST TECHNIQUE: Multiplanar, multisequence MR imaging of the right shoulder was performed before and after the administration of intravenous contrast. CONTRAST:  6mL GADAVIST  GADOBUTROL  1 MMOL/ML IV SOLN COMPARISON:  Radiographs 03/31/2024.  Chest CTA 08/31/2021. FINDINGS: Rotator cuff:  Intact without significant tendinosis. Muscles: There is a complex fluid collection in the right lateral chest wall, anterior to the right scapula, involving the subscapularis and latissimus dorsi muscles. This measures approximately 3.7 x 3.1 x 3.6 cm and demonstrates thin septations and intrinsic T1 shortening, most consistent with a subacute hematoma. There is peripheral enhancement following contrast. There is significant surrounding soft tissue edema and enhancement, and an infected fluid collection is not excluded by this examination. No significant muscular edema or atrophy in the additional rotator cuff muscles. Biceps long head:  Intact and normally positioned. Acromioclavicular Joint: The acromion is type 2. There are mild acromioclavicular degenerative changes. No significant fluid is  present in the subacromial - subdeltoid bursa. Glenohumeral Joint: Mild glenohumeral degenerative changes. Small joint effusion with mildly prominent fluid in the superior subscapularis recess. Mild associated synovial enhancement without definite signs of septic arthritis. Labrum: No evidence of labral tear or paralabral cyst. Bones: No evidence of acute fracture, dislocation or osteomyelitis about the right shoulder. Other: As above, large complex peripherally enhancing fluid collection in the right lateral chest wall with surrounding soft tissue edema and enhancement. IMPRESSION: 1. Large complex peripherally enhancing fluid collection in the right lateral chest wall with surrounding soft tissue edema and enhancement, most consistent with a subacute hematoma. Cannot exclude superimposed infection by this examination. Recommend clinical correlation and consideration of aspiration under ultrasound, as clinically warranted. 2. No evidence of osteomyelitis or septic arthritis. 3. The rotator cuff, biceps tendon and labrum appear intact. 4. Mild glenohumeral  and acromioclavicular degenerative changes. Electronically Signed   By: Elsie Perone M.D.   On: 04/02/2024 11:20   DG Shoulder Right Result Date: 03/31/2024 EXAM: 1 VIEW XRAY OF THE RIGHT SHOULDER 03/31/2024 01:34:00 PM COMPARISON: None available. CLINICAL HISTORY: Pain in r shoulder FINDINGS: BONES AND JOINTS: Glenohumeral joint is normally aligned. No acute fracture or dislocation. The [facility] joint is unremarkable in appearance. SOFT TISSUES: Surgical clips in right axilla. Right upper extremity PICC in place. No abnormal calcifications. Visualized lung is unremarkable. IMPRESSION: 1. No acute osseous abnormality of the right shoulder. Electronically signed by: Donnice Mania MD 03/31/2024 05:20 PM EDT RP Workstation: [id number]   US  EKG SITE RITE Result Date: 03/30/2024 If Site Rite image not attached, placement could not be confirmed due to current  cardiac rhythm.  IR GJ Tube Change Result Date: 03/30/2024 INDICATION: Malfunctioning gastrojejunostomy EXAM: FLUOROSCOPIC EXCHANGE FOR  GASTROSTOMY Date:  03/30/2024 03/30/2024 12:23 pm Radiologist:  M. Frederic Specking, MD Guidance:  FLUOROSCOPIC MEDICATIONS: NONE. ANESTHESIA/SEDATION: Moderate Sedation Time:  NONE. The patient was continuously monitored during the procedure by the interventional radiology nurse under my direct supervision. CONTRAST:  30mL OMNIPAQUE  IOHEXOL  300 MG/ML SOLN - administered into the gastric lumen. FLUOROSCOPY: Fluoroscopy Time: 10 minutes 5 seconds (31 mGy). COMPLICATIONS: None immediate. PROCEDURE: Informed consent was obtained from the patient following explanation of the procedure, risks, benefits and alternatives. The patient understands, agrees and consents for the procedure. All questions were addressed. A time out was performed. Existing malpositioned GJ feeding tube was injected with contrast under fluoroscopy. GJ feeding tube is looped in the stomach with the tip in the proximal duodenum. Attempts were made to reduce the loop in the stomach while maintaining access in the duodenum however this was unsuccessful. Duodenal access was lost. Accessing the duodenum is challenging related to the angulated gastrostomy access along the lesser curvature of the stomach. Attempts were made to access the duodenum with various catheters and guidewires. Only access obtainable into the duodenum included a large loop within the stomach. Attempts were made to pass the GJ feeding tube along this access pathway however this was unsuccessful. Catheter could not be advanced into the duodenum. Therefore, the procedure was aborted. Over the access an 87 French balloon retention gastrostomy was inserted. Balloon was inflated with 5 cc saline and retracted against anterior stomach wall. This was secured externally. Patient will be set up for a new gastrojejunostomy placement to improve the  percutaneous access position and angulation to better access the proximal jejunum. IMPRESSION: Unsuccessful attempt at 23 French gastrojejunostomy feeding tube reposition and exchange, due to percutaneous access position and acute angulation. See above findings and plan for a new gastrojejunostomy placement. Electronically Signed   By: CHRISTELLA.  Shick M.D.   On: 03/30/2024 14:30   CT ABDOMEN PELVIS W CONTRAST Result Date: 03/28/2024 CLINICAL DATA:  Abdominal pain, acute, nonlocalized. Concern for GJ tube out of place. EXAM: CT ABDOMEN AND PELVIS WITH CONTRAST TECHNIQUE: Multidetector CT imaging of the abdomen and pelvis was performed using the standard protocol following bolus administration of intravenous contrast. RADIATION DOSE REDUCTION: This exam was performed according to the departmental dose-optimization program which includes automated exposure control, adjustment of the mA and/or kV according to patient size and/or use of iterative reconstruction technique. CONTRAST:  75mL OMNIPAQUE  IOHEXOL  350 MG/ML SOLN COMPARISON:  03/04/2024, 03/27/2024. FINDINGS: Lower chest: No acute abnormality. Hepatobiliary: Hypodense region is noted in the left lobe of the liver adjacent to the falciform ligament suggesting focal fatty infiltration. The  gallbladder is without stones. Intrahepatic biliary ductal dilatation. The common bile duct is normal in caliber. Pancreas: Coarse calcifications are noted in the pancreas, most pronounced at the head suggesting chronic pancreatitis. There is pancreatic ductal dilatation measuring at up to 1 cm in the tail, not significantly changed from the previous exam. Mild edema and fat stranding is unchanged. Loculated fluid collection at the head of the pancreas is not well delineated, however appears smaller as compared with the previous exam. Spleen: A small hypodense region is noted in the posterior aspect of the spleen with surrounding hypervascularity, query hemangioma. Adrenals/Urinary  Tract: The adrenal glands are within normal limits. The kidneys enhance symmetrically. No renal calculus or hydronephrosis bilaterally. The bladder is unremarkable. Stomach/Bowel: A GJ tube loops in the stomach and terminates in the proximal duodenum. There is thickening of the walls of the duodenum and gastric antrum. No bowel obstruction, free air, or pneumatosis. Appendix appears normal. Vascular/Lymphatic: There is focal narrowing of the confluence of the portal vein, splenic vein, and superior mesenteric vein. No obvious filling defect is seen. Enlarged lymph nodes are noted about the pancreatic head and proximal duodenum, likely reactive. Reproductive: Prostate is unremarkable. Other: No abdominopelvic ascites. Musculoskeletal: No acute osseous abnormality. Sternotomy wires are present over the midline. IMPRESSION: 1. GJ tube loops in the stomach and terminates in the proximal duodenum. 2. Stable findings of acute on chronic pancreatitis with associated gastritis and duodenitis. 3. Loculated fluid collection adjacent to the head of the pancreas is not well delineated, however appears smaller as compared with the previous exam. 4. 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 Electronically Signed   By: Leita Birmingham M.D.   On: 03/28/2024 18:48   MR ABDOMEN MRCP W WO CONTAST Result Date: 03/27/2024 CLINICAL DATA:  elevated labs/?partial biliary obstruction; acute on chronic pancreatitis. EXAM: MRI ABDOMEN WITHOUT AND WITH CONTRAST (INCLUDING MRCP) TECHNIQUE: Multiplanar multisequence MR imaging of the abdomen was performed both before and after the administration of intravenous contrast. Heavily T2-weighted images of the biliary and pancreatic ducts were obtained, and three-dimensional MRCP images were rendered by post processing. CONTRAST:  6mL GADAVIST  GADOBUTROL  1 MMOL/ML IV SOLN COMPARISON:  CT scan abdomen and pelvis from 03/04/2024 and  MRI abdomen from 01/23/2024. FINDINGS: Lower chest: Unremarkable MR appearance to the lung bases. No pleural effusion. No pericardial effusion. Normal heart size. Hepatobiliary: The liver is normal in size. Noncirrhotic configuration. No focal liver lesion. There is new mild central intrahepatic biliary ductal dilation. There is also moderate extrahepatic bile duct dilation. The extrahepatic bile duct measures up to 15 mm in the proximal portion and then gradually narrows in the midportion with smooth walls. At the junction of upper 2/3 and lower 1/3 it measures up to 3.5-4 mm and in the distal portion it measures up to 1-2 mm. No discrete obstructing mass or choledocholithiasis seen. 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 Physiologically distended gallbladder. No abnormal wall thickening or pericholecystic fat stranding. No cholelithiasis. Pancreas: Heterogeneous signal intensity of the pancreas. There is mild T1 hyperintense signal in the distal pancreatic body and tail region; however, the proximal pancreas exhibit diffuse interstitial edema with fuzzy margins and peripancreatic fat stranding. There is heterogeneity and slightly comparative less enhancement of the pancreatic head/uncinate process and proximal body in relation to the rest of the distal pancreas. There is also marked dilation of the main pancreatic duct which  measures up to 10-10.5 mm in the pancreatic body region. There are innumerable dilated/prominent pancreatic side branches. The main pancreatic duct gradually tapers before the ampulla of Vater. There are several peripancreatic fluid collections with largest along the anterior aspect of the head measuring up to 1.3 x 2.8 cm (series 6, image 26). The collection exhibits smooth enhancing walls. Overall, the collection has significantly decreased in size since the prior MRI from 01/23/2024. Spleen: Top-normal in size. Normal signal intensity. No  focal lesion. Adrenals/Urinary Tract: Unremarkable adrenal glands. No hydroureteronephrosis. No suspicious renal mass. Stomach/Bowel: There is a at least 3.3 x 4.6 cm diverticulum arising from the fundus of the stomach. Percutaneous gastrojejunostomy tube is seen. Visualized portions within the abdomen are unremarkable. No disproportionate dilation of bowel loops. Vascular/Lymphatic: No pathologically enlarged lymph nodes identified. No abdominal aortic aneurysm demonstrated. No ascites. Other:  None. Musculoskeletal: No suspicious bone lesions identified. IMPRESSION: 1. 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 No discrete obstructing mass or choledocholithiasis seen. There is also marked dilation of the main pancreatic duct which gradually tapers before the Ampulla of Vater. There are innumerable dilated/prominent pancreatic side branches. There are several peripancreatic fluid collections with largest along the anterior aspect of the head measuring up to 1.3 x 2.8 cm. Overall, the collection has significantly decreased in size since the prior MRI from 01/23/2024. 2. There is diffuse interstitial edema of the pancreas with fuzzy margins and peripancreatic fat stranding. There is heterogeneity and slightly comparative less enhancement of the pancreatic head/uncinate process and proximal body in relation to the rest of the distal pancreas. Findings favor acute on chronic pancreatitis. 3. There is a 3.3 x 4.6 cm diverticulum arising from the fundus of the stomach. 4. Multiple other nonacute observations, as described above. Electronically Signed   By: Ree Molt M.D.   On: 03/27/2024 08:31   MR 3D Recon At Scanner Result Date: 03/27/2024 CLINICAL DATA:  elevated labs/?partial biliary obstruction; acute on chronic pancreatitis. EXAM: MRI ABDOMEN WITHOUT AND WITH CONTRAST (INCLUDING MRCP) TECHNIQUE: Multiplanar multisequence MR imaging of the abdomen was  performed both before and after the administration of intravenous contrast. Heavily T2-weighted images of the biliary and pancreatic ducts were obtained, and three-dimensional MRCP images were rendered by post processing. CONTRAST:  6mL GADAVIST  GADOBUTROL  1 MMOL/ML IV SOLN COMPARISON:  CT scan abdomen and pelvis from 03/04/2024 and MRI abdomen from 01/23/2024. FINDINGS: Lower chest: Unremarkable MR appearance to the lung bases. No pleural effusion. No pericardial effusion. Normal heart size. Hepatobiliary: The liver is normal in size. Noncirrhotic configuration. No focal liver lesion. There is new mild central intrahepatic biliary ductal dilation. There is also moderate extrahepatic bile duct dilation. The extrahepatic bile duct measures up to 15 mm in the proximal portion and then gradually narrows in the midportion with smooth walls. At the junction of upper 2/3 and lower 1/3 it measures up to 3.5-4 mm and in the distal portion it measures up to 1-2 mm. No discrete obstructing mass or choledocholithiasis seen. 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 Physiologically distended gallbladder. No abnormal wall thickening or pericholecystic fat stranding. No cholelithiasis. Pancreas: Heterogeneous signal intensity of the pancreas. There is mild T1 hyperintense signal in the distal pancreatic body and tail region; however, the proximal pancreas exhibit diffuse interstitial edema with fuzzy margins and peripancreatic fat stranding. There is heterogeneity and slightly comparative less enhancement of the pancreatic head/uncinate process and proximal  body in relation to the rest of the distal pancreas. There is also marked dilation of the main pancreatic duct which measures up to 10-10.5 mm in the pancreatic body region. There are innumerable dilated/prominent pancreatic side branches. The main pancreatic duct gradually tapers before the ampulla of Vater. There are several  peripancreatic fluid collections with largest along the anterior aspect of the head measuring up to 1.3 x 2.8 cm (series 6, image 26). The collection exhibits smooth enhancing walls. Overall, the collection has significantly decreased in size since the prior MRI from 01/23/2024. Spleen: Top-normal in size. Normal signal intensity. No focal lesion. Adrenals/Urinary Tract: Unremarkable adrenal glands. No hydroureteronephrosis. No suspicious renal mass. Stomach/Bowel: There is a at least 3.3 x 4.6 cm diverticulum arising from the fundus of the stomach. Percutaneous gastrojejunostomy tube is seen. Visualized portions within the abdomen are unremarkable. No disproportionate dilation of bowel loops. Vascular/Lymphatic: No pathologically enlarged lymph nodes identified. No abdominal aortic aneurysm demonstrated. No ascites. Other:  None. Musculoskeletal: No suspicious bone lesions identified. IMPRESSION: 1. 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 No discrete obstructing mass or choledocholithiasis seen. There is also marked dilation of the main pancreatic duct which gradually tapers before the Ampulla of Vater. There are innumerable dilated/prominent pancreatic side branches. There are several peripancreatic fluid collections with largest along the anterior aspect of the head measuring up to 1.3 x 2.8 cm. Overall, the collection has significantly decreased in size since the prior MRI from 01/23/2024. 2. There is diffuse interstitial edema of the pancreas with fuzzy margins and peripancreatic fat stranding. There is heterogeneity and slightly comparative less enhancement of the pancreatic head/uncinate process and proximal body in relation to the rest of the distal pancreas. Findings favor acute on chronic pancreatitis. 3. There is a 3.3 x 4.6 cm diverticulum arising from the fundus of the stomach. 4. Multiple other nonacute observations, as described above. Electronically  Signed   By: Ree Molt M.D.   On: 03/27/2024 08:31   IR GJ Tube Change Result Date: 03/19/2024 INDICATION: 33 year old male with history of pancreatitis status post percutaneous gastrojejunostomy tube placed on 02/13/2024 presenting with tube malfunction. EXAM: FLUOROSCOPIC GUIDED REPLACEMENT OF GASTROJEJUNOSTOMY TUBE COMPARISON:  02/13/2024 MEDICATIONS: None. CONTRAST:  25mL OMNIPAQUE  IOHEXOL  300 MG/ML SOLN, 25mL OMNIPAQUE  IOHEXOL  300 MG/ML SOLN, 25mL OMNIPAQUE  IOHEXOL  300 MG/ML SOLN FLUOROSCOPY TIME:  Seventy-eight mGy reference air kerma COMPLICATIONS: None. PROCEDURE: Informed written consent was obtained from the patient after a discussion of the risks and benefits. The upper abdomen and the external portion of the existing gastrojejunostomy tube was prepped and draped in the usual sterile fashion, and a sterile drape was applied covering the operative field. Maximum barrier sterile technique with sterile gowns and gloves were used for the procedure. A timeout was performed prior to the initiation of the procedure. Preprocedure radiograph demonstrated coiled position of the jejunal arm within the body of the stomach. The external portion of the catheter was cut to release the retention balloon. A stiff Glidewire was inserted via the gastrostomy port and coiled within the gastric lumen. A 10 French angled sheath was then placed over the wire and used to direct the wire into the duodenum with moderate difficulty. The wire was advanced with the assistance of a Kumpe the catheter coaxially to the proximal jejunum. The catheter and sheath were removed and a new, 18 French balloon retention gastrojejunostomy tube was placed. The catheter tip was unable to be advanced beyond the second  portion of the duodenum due to redundancy of the jejunal limb within the stomach. Balloon was inflated with 10 mL of sterile water . Contrast injection via the jejunostomy and gastric lumens confirmed appropriate functioning and  positioning. A dressing was placed. The patient tolerated procedure well without immediate postprocedural complication. IMPRESSION: Successful fluoroscopic guided replacement of malpositioned 18 French gastrojejunostomy. The tip of the jejunostomy lumen lies within the second portion of the duodenum. Both lumens ready for immediate use. Ester Sides, MD [facility] Electronically Signed   By: Ester Sides M.D.   On: 03/19/2024 20:56   DG ERCP Result Date: 03/09/2024 CLINICAL DATA:  Chronic recurrent pancreatitis and pancreatic duct dilatation on MRCP EXAM: ERCP 14 fluoroscopic intra procedural views of the right upper quadrant submitted for interpretation TECHNIQUE: Multiple spot images obtained with the fluoroscopic device and submitted for interpretation post-procedure. FLUOROSCOPY: Radiation Exposure Index (as provided by the fluoroscopic device): 32.09 mGy Kerma COMPARISON:  None Available. FINDINGS: Images demonstrate what appears to be percutaneous enteric tube with retention balloon. Flexible endoscopy device present with minimal contrast injection and no definite advancement into the common bile duct. IMPRESSION: Images of an attempted common bile duct access with minimal contrast injection. These images were submitted for radiologic interpretation only. Please see the procedural report for the amount of contrast and the fluoroscopy time utilized. Electronically Signed   By: Cordella Banner   On: 03/09/2024 16:09   DG C-Arm 1-60 Min-No Report Result Date: 03/09/2024 Fluoroscopy was utilized by the requesting physician.  No radiographic interpretation.    Microbiology: Results for orders placed or performed during the hospital encounter of 03/28/24  Aerobic/Anaerobic Culture w Gram Stain (surgical/deep wound)     Status: None (Preliminary result)   Collection Time: 04/03/24 10:35 AM   Specimen: Axilla; Wound  Result Value Ref Range  Status   Specimen Description AXILLA  Final   Special Requests HEMATOMA  Final   Gram Stain   Final    RARE WBC PRESENT,BOTH PMN AND MONONUCLEAR NO ORGANISMS SEEN    Culture   Final    NO GROWTH 4 DAYS NO ANAEROBES ISOLATED; CULTURE IN PROGRESS FOR 5 DAYS Performed at [facility], [address], [place], [place] [address]    Report Status PENDING  Incomplete    Labs: CBC: Recent Labs  Lab 04/04/24 1153 04/05/24 0326 04/06/24 0632 04/07/24 0051 04/08/24 0353  WBC 6.8 5.5 6.3 5.8 8.6  HGB 9.0* 9.1* 8.1* 7.8* 7.9*  HCT 28.3* 28.2* 25.1* 24.3* 24.7*  MCV 93.7 87.9 88.4 88.4 87.3  PLT 373 316 307 300 324   Basic Metabolic Panel: Recent Labs  Lab 04/02/24 0107 04/03/24 0315 04/06/24 0632 04/07/24 0926 04/08/24 0353  NA 137 138 137 138 133*  K 3.7 3.8 4.5 4.3 4.0  CL 102 103 101 102 97*  CO2 27 27 28 28 29   GLUCOSE 141* 137* 196* 163* 160*  BUN 9 9 16 18 18   CREATININE 0.51* 0.54* 0.53* 0.59* 0.60*  CALCIUM  8.3* 8.4* 8.7* 8.8* 9.0  MG 1.9 1.9 2.0  --   --   PHOS 4.1 3.9 4.2  --   --    Liver Function Tests: Recent Labs  Lab 04/02/24 0107 04/03/24 0315 04/06/24 0632 04/07/24 0926 04/08/24 0353  AST 17  --  45* 55* 40  ALT 33  --  64* 69* 62*  ALKPHOS 296*  --  245* 225* 197*  BILITOT 0.6  --  0.5 0.4 0.3  PROT 6.4*  --  6.6 6.5 6.4*  ALBUMIN  2.9* 2.7* 3.0* 3.1* 3.1*   CBG: Recent Labs  Lab 04/07/24 0046 04/07/24 0522 04/07/24 0838 04/07/24 1215 04/07/24 1817  GLUCAP 113* 142* 146* 99 94    Discharge time spent: greater than 30 minutes.  Signed: Landon FORBES Baller, MD [facility] 04/08/2024

## 2024-04-08 NOTE — Progress Notes (Signed)
 Patient had a pain of 9 out of 10, became nauseous, sweaty and pale. Pain and nausea medications were given and tube feeding was stopped. Patient stated that his stomach pain felt like pressure and too much food in my stomach. Vital signs were checked and WNL.  Tube feeding was reconnected and continued at 0025 with no complications for the rest of the shift.

## 2024-04-08 NOTE — TOC Transition Note (Signed)
 Transition of Care [facility]) - Discharge Note   Patient Details  Name: [name] [name] MRN: [id number] Date of Birth: 04/24/1991  Transition [facility]) CM/SW Contact:  Roxie KANDICE Stain, RN Phone Number: 04/08/2024, 12:13 PM   Clinical Narrative:    Patient stable for discharge.  Pam with [employer] will supply the tube feeds.  No other ICM (Inpatient Care Management) needs at this time.    Final next level of care: Home/Self Care Barriers to Discharge: Barriers Resolved   Patient Goals and CMS Choice Patient states their goals for this hospitalization and ongoing recovery are:: return home          Discharge Placement                 home      Discharge Plan and Services Additional resources added to the After Visit Summary for                                       Social Drivers of Health (SDOH) Interventions SDOH Screenings   Food Insecurity: No Food Insecurity (03/29/2024)  Housing: Low Risk  (03/29/2024)  Transportation Needs: No Transportation Needs (03/29/2024)  Utilities: Not At Risk (03/29/2024)  Depression (PHQ2-9): Medium Risk (03/02/2024)  Financial Resource Strain: Low Risk  (04/25/2023)  Physical Activity: Sufficiently Active (04/25/2023)  Social Connections: Unknown (04/25/2023)  Stress: No Stress Concern Present (04/25/2023)  Tobacco Use: Low Risk  (03/31/2024)     Readmission Risk Interventions    01/29/2024   12:53 PM 01/15/2024   11:03 AM  Readmission Risk Prevention Plan  Post Dischage Appt  Complete  Medication Screening  Complete  Transportation Screening Complete Complete  PCP or Specialist Appt within 5-7 Days Complete   Home Care Screening Complete   Medication Review (RN CM) Complete

## 2024-04-09 ENCOUNTER — Telehealth: Payer: Self-pay

## 2024-04-09 NOTE — Telephone Encounter (Signed)
 Patient is scheduled with Dr Maude Dawn at [facility] today, 04/09/24 for evaluation.

## 2024-04-09 NOTE — Telephone Encounter (Signed)
 Darlyn does not need a follow up visit with me, only GI and cardiology regarding his warfarin. Unless there is something else he needed. GI should be signing his home health orders also.

## 2024-04-09 NOTE — Transitions of Care (Post Inpatient/ED Visit) (Signed)
 04/09/2024  Name: [name] [name] MRN: [id number] DOB: [birth year]  Today's TOC FU Call Status: Today's TOC FU Call Status:: Successful TOC FU Call Completed TOC FU Call Complete Date: 04/09/24 Patient's Name and Date of Birth confirmed.  Transition Care Management Follow-up Telephone Call Date of Discharge: 04/08/24 Discharge Facility: [facility]) Type of Discharge: Inpatient Admission Primary Inpatient Discharge Diagnosis:: pancreatitis How have you been since you were released from the hospital?: Better Any questions or concerns?: No  Items Reviewed: Did you receive and understand the discharge instructions provided?: Yes Medications obtained,verified, and reconciled?: Yes (Medications Reviewed) Any new allergies since your discharge?: No Dietary orders reviewed?: Yes Do you have support at home?: Yes People in Home [RPT]: parent(s)  Medications Reviewed Today: Medications Reviewed Today     Reviewed by Emmitt Pan, LPN (Licensed Practical Nurse) on 04/09/24 at [phone]  Med List Status: <None>   Medication Order Taking? Sig Documenting Provider Last Dose Status Informant  acetaminophen  (TYLENOL ) 500 MG tablet [id number] Yes Take 1,000 mg by mouth every 6 (six) hours as needed for moderate pain (pain score 4-6). [provider]  Active Self  aspirin  EC 81 MG EC tablet [id number] Yes Take 1 tablet (81 mg total) by mouth daily. Swallow whole. Barrett, Rocky SAUNDERS, [name]  Active Self  enoxaparin  (LOVENOX ) 60 MG/0.6ML injection [id number] Yes Inject 0.6 mLs (60 mg total) into the skin every 12 (twelve) hours. Dibia, Pauline E, MD  Active   hydrOXYzine  (ATARAX ) 25 MG tablet [id number] Yes Take 1 tablet (25 mg total) by mouth 3 (three) times daily as needed for anxiety. Sigdel, Santosh, MD  Active Self  lipase/protease/amylase (CREON ) 36000 UNITS CPEP capsule [id number] Yes Take 2 capsules (72,000 Units total) by mouth 3 (three) times daily before meals. Perri DELENA Meliton Mickey., MD  Active Self           Med Note (MARROW, ROCKY ONEIDA Repress Mar 29, 2024  9:08 AM) Pt reported that he has not been eating on a regular basis, therefore the last time he took this medication has been within the past month  loratadine  (CLARITIN ) 10 MG tablet [id number] Yes Take 10 mg by mouth daily. [provider]  Active Self  methocarbamol  (ROBAXIN ) 500 MG tablet [id number] Yes Take 1-2 tablets (500-1,000 mg total) by mouth 4 (four) times daily.  Patient taking differently: Take 500 mg by mouth daily as needed for muscle spasms.   Lucius Krabbe, NP  Active Self  metoprolol  succinate (TOPROL  XL) 25 MG 24 hr tablet [id number] Yes Take 0.5 tablets (12.5 mg total) by mouth at bedtime. Thukkani, Arun K, MD  Active Self  ondansetron  (ZOFRAN -ODT) 4 MG disintegrating tablet [id number] Yes Take 4 mg by mouth every 8 (eight) hours as needed. [provider]  Active Self  Oxycodone  HCl 10 MG TABS [id number] Yes Take 1 tablet (10 mg total) by mouth every 4 (four) hours as needed for up to 3 days. Dibia, Pauline E, MD  Active   pantoprazole  (PROTONIX ) 40 MG tablet [id number] Yes Take 1 tablet (40 mg total) by mouth 2 (two) times daily before a meal. Mansouraty, Aloha Mickey., MD  Active Self  polyethylene glycol powder (GLYCOLAX /MIRALAX ) 17 GM/SCOOP powder [id number] Yes Place 1 capful (17 g) into feeding tube 2 (two) times daily.  Patient taking differently: Take 17 g by mouth daily as needed for mild constipation.   Sigdel, Santosh, MD  Active Self  warfarin (COUMADIN ) 3 MG tablet [id number] Yes  TAKE 1 & 1/2 TABLETS TO 2 TABLETS BY MOUTH DAILY OR AS DIRECTED BY COAGULATION CLINIC Sigdel, Santosh, MD  Active Self           Med Note (MARROW, ERIN T   Sun Mar 29, 2024  9:12 AM) Pt reported that his last dose was 1 1/2 Tablets on  03/24/24 4:00 PM             Home Care and Equipment/Supplies: Were Home Health Services Ordered?: NA Any new equipment or medical supplies ordered?:  NA  Functional Questionnaire: Do you need assistance with bathing/showering or dressing?: No Do you need assistance with meal preparation?: No Do you need assistance with eating?: No Do you have difficulty maintaining continence: No Do you need assistance with getting out of bed/getting out of a chair/moving?: No Do you have difficulty managing or taking your medications?: No  Follow up appointments reviewed: PCP Follow-up appointment confirmed?: Yes Date of PCP follow-up appointment?: 04/13/24 Follow-up Provider: [facility] Follow-up appointment confirmed?: Yes Date of Specialist follow-up appointment?: 04/09/24 Follow-Up Specialty Provider:: [facility] Do you need transportation to your follow-up appointment?: No Do you understand care options if your condition(s) worsen?: Yes-patient verbalized understanding    SIGNATURE Julian Lemmings, LPN [facility] Nurse Health Advisor Direct Dial [phone]

## 2024-04-09 NOTE — Progress Notes (Signed)
 Surgical Oncology New Patient Visit   Date of Visit: 04/09/2024   Referring MD/Care Team:  [name] Gordy Sayre, MD [address] [place],  [place] [address] Patient Care Team: [name] [name] as PCP - General   Diagnosis:    ICD-10-CM   1. Alcohol-induced chronic pancreatitis (CMS/HHS-HCC)  K86.0 Complete Blood Count (CBC)    Comprehensive Metabolic Panel (CMP)    CT dual pancreas protocol incl CT abd pel w contrast      No chief complaint on file.    HPI: [name] [name] is a 33 y.o. 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  Per Discharge summary from admission 8/2-02/17/24: -As per chart review, EUS by Dr. Wilhelmenia in 07/2023 that showed cystic lesion at the head of the pancreas consistent with a pseudocyst and a 5 mm stone within the pancreatic duct at the junction of the head/neck.   -Cytology of the cyst was benign.  He was scheduled for an ERCP with attempted stone extraction in September with Dr. Wilhelmenia but now admitted with abdominal pain. -[facility] was consulted, patient was treated supportively with n.p.o., IVF, core track with postpyloric feeding (initially started on 8/6).  Core track was dislodged on 8/18, briefly treated with liquid diet and core track was replaced.   -CT A/P on 8/18 showed gastric pneumatosis, general surgery was consulted, octreotide  stopped (to avoid dysmotility), patient was unable to tolerate NG tube placement, made n.p.o. again/tube feeds held, gentle IVF continuing, PICC line placed and TPN started on 01/29/2024. -Pneumatosis has resolved.  Cortrack tube has been removed.  Patient was tolerating heart healthy diet however was having intermittent nausea and vomiting.  Could not meet nutritional goals.  He did not  want to go home with TPN.  Patient then had  postpyloric tube placed by IR 02/06/2024, tube feeds started, TPN weaned off 02/07/2024.  Ultimately GJ tube placed by IR on 9/4 .  IV heparin  switched to Lovenox /Coumadin  9/6, PICC line removed  He was recently admitted 10/18-10/29/25 for GJ malfunction. He had an unsuccessful attempt at exchange due to percutaneous access position and acute angulation. He then had a new GJ placed 10/24.   On review of imaging:  03/04/24: CT AP: Extensive parenchymal calcifications are seen throughout the head and body of  the pancreas in keeping with changes of chronic pancreatitis. The main  pancreatic duct is dilated measuring up to 12 mm in diameter within the mid body appears centrally constricted by dense calcification within the pancreatic head. There are extensive peripancreatic inflammatory changes within the pancreatic or duodenal groove and adjacent to the distal body of the stomach.  These may relate to changes of acute on chronic pancreatitis or severe gastritis/duodenitis. A loculated fluid collection extending from the head of the pancreas demonstrates a tortuous, lobulated coarse appearance, relatively stable in size since prior examination, measuring 1.5 x 3.5 cm. Two additional poorly circumscribed hyperdense fluid collections are seen adjacent to the  Gastric Antrum and first portion of the duodenum, in keeping with peri-duodenal  hematoma, measuring up to 2.2 x 4.9 cm and 1.2 x 2.6 cm. Extensive circumferential wall thickening of gastric antrum and D1. Marked narrowing of SMV and splenic vein at confluence with main PV.  03/27/24: MRI/MRCP: new mild central biliary dilation and moderate extrahepatic biliary dilation up to 15 mm in  the proximal portion which gradually narrows in the mid portion. No mass or choledocholithiasis. Distended GB.Heterogeneous signal intensity of the pancreas. The proximal pancreas exhibits diffuse interstitial edema with fuzzy  margins and peripancreatic fat stranding. There is  heterogeneity and slightly comparative less enhancement of the pancreatic head/uncinate process and proximal body in relation to  the rest of the distal pancreas. There is also marked dilation of the main pancreatic duct which measures up to 10-10.5 mm in the  pancreatic body region. There are innumerable dilated/prominent pancreatic side branches. The main pancreatic duct gradually tapers before the ampulla of Vater. Several peripancreatic fluid collections up to 1.3 x 2.8 cm.  03/28/24: CT AP: course calcifications in the pancreas most pronounced in the head suggesting chronic pancreatitis. PD up to 1 cm in the tail similar to prior. Mild edema and fat stranding unchanged. Loculated fluid at the head of the pancreas not well delineated but smaller. Query hemangioma in the spleen. Thickening of duodenum and antrum.GJ loops in stomach. Marked narrowing of junction of PV, splenic vein, and SMV.   Today, he reports that he is doing okay, currently most bothered by some tenderness around the new GJ tube.  Reports a history of chronic pancreatitis dating back to 2016, from which he had acute flares approximately once a year.  In the past year his flares have been more frequent, occurring every two months, for which he has had multiple recent hospitalizations.  Patient has been following with GI in [place], who were planning to perform an ERCP with stenting to relieve obstruction of the main pancreatic duct in August.  However, ERCP was aborted due to acute inflammation.  He would like to know if surgical intervention is warranted for his chronic pancreatitis with associated calcifications and main pancreatic duct dilation. His concerns are mostly with his continued weight loss and inability to tolerate food.   He additionally reports a 45 pound weight loss over the last year.  He has been unable to tolerate oral feeds except for small amounts of sorbet,  finding that oral feeding triggers his flares.  He has associated nausea, occasional vomiting, no constipation or diarrhea.  Denies chest pain, SOB, recent skin changes, fever, chills.    Reports a past history of alcohol use, however has not had alcohol since October 2024.    Allergies: Allergies  Allergen Reactions  . Amoxicillin Hives    Current Medications:  Current Outpatient Medications:  .  aspirin  81 MG EC tablet, Take 81 mg by mouth once daily, Disp: , Rfl:  .  enoxaparin  (LOVENOX ) 60 mg/0.6 mL injection syringe, Inject 60 mg subcutaneously every 12 (twelve) hours, Disp: , Rfl:  .  loratadine  (CLARITIN ) 10 mg tablet, Take 10 mg by mouth once daily, Disp: , Rfl:  .  metoprolol  succinate (TOPROL -XL) 25 MG XL tablet, Take 12.5 mg by mouth at bedtime, Disp: , Rfl:  .  ondansetron  (ZOFRAN -ODT) 4 MG disintegrating tablet, Take 1 tablet (4 mg total) by mouth every 8 (eight) hours as needed for Nausea or Vomiting, Disp: 20 tablet, Rfl: 2 .  oxyCODONE  (OXYIR) 10 mg immediate release tablet, Take 10 mg by mouth every 6 (six) hours as needed, Disp: , Rfl:  .  warfarin (COUMADIN ) 3 MG tablet, TAKE 1 AND 1/2 TABLETS BY MOUTH DAILY. EXCEPT TAKE 2 TABLETS ON MONDAYS, WEDNESDAYS AND FRIDAYS OR AS DIRECTED BY COAGULATION CLINIC, Disp: , Rfl:  .  oxyCODONE  (ROXICODONE ) 5 MG immediate release tablet, Take 1 tablet (5 mg  total) by mouth every 6 (six) hours as needed (severe pain) (Patient not taking: Reported on 04/09/2024), Disp: 20 tablet, Rfl: 0  Past Medical History: Past Medical History:  Diagnosis Date  . Arrhythmia   . Asthma, unspecified asthma severity, unspecified whether complicated, unspecified whether persistent (HHS-HCC)   . Heart valve disease     Past Surgical History: Past Surgical History:  Procedure Laterality Date  . ASCENDING AORTIC ANEURYSM REPAIR W/ MECHANICAL AORTIC VALVE REPLACEMENT        Social History: Social History   Socioeconomic History  . Marital  status: Life Partner  Tobacco Use  . Smoking status: Never  . Smokeless tobacco: Never  Vaping Use  . Vaping status: Never Used  Substance and Sexual Activity  . Alcohol use: Not Currently  . Drug use: Yes    Types: Marijuana  . Sexual activity: Defer   Social Drivers of Health   Financial Resource Strain: Low Risk  (04/25/2023)   Received from [facility]   Overall Financial Resource Strain (CARDIA)   . Difficulty of Paying Living Expenses: Not very hard  Food Insecurity: No Food Insecurity (03/29/2024)   Received from [facility]   Hunger Vital Sign   . Within the past 12 months, you worried that your food would run out before you got the money to buy more.: Never true   . Within the past 12 months, the food you bought just didn't last and you didn't have money to get more.: Never true  Transportation Needs: No Transportation Needs (03/29/2024)   Received from [facility] - Transportation   . In the past 12 months, has lack of transportation kept you from medical appointments or from getting medications?: No   . In the past 12 months, has lack of transportation kept you from meetings, work, or from getting things needed for daily living?: No  Physical Activity: Sufficiently Active (04/25/2023)   Received from [facility]   Exercise Vital Sign   . On average, how many days per week do you engage in moderate to strenuous exercise (like a brisk walk)?: 5 days   . On average, how many minutes do you engage in exercise at this level?: 150+ min  Stress: No Stress Concern Present (04/25/2023)   Received from [facility] - Occupational Stress Questionnaire   . Feeling of Stress : Only a little  Social Connections: Unknown (04/25/2023)   Received from [facility]   Social Connection and Isolation Panel   . In a typical week, how many times do you talk on the phone with family, friends, or neighbors?: Three times a week   . How  often do you get together with friends or relatives?: Once a week   . How often do you attend church or religious services?: Patient declined   . Do you belong to any clubs or organizations such as church groups, unions, fraternal or athletic groups, or school groups?: No   . Are you married, widowed, divorced, separated, never married, or living with a partner?: Never married  Housing Stability: Unknown (02/24/2024)   Housing Stability Vital Sign   . Homeless in the Last Year: No    Family History: Family History  Problem Relation Age of Onset  . Colon cancer Maternal Grandmother   . Prostate cancer Maternal Grandfather   . Prostate cancer Paternal Grandfather     Review of Systems:  Complete ROS was done, all pertinent/positives/negatives  listed in HPI, all other systems negative.   Physical Exam: BP 114/62 (BP Location: Right upper arm, Patient Position: Sitting, BP Cuff Size: Adult)   Pulse 75   Temp 36.5 C (97.7 F) (Oral)   Ht 180.3 cm (5' 10.98)   Wt 64.6 kg (142 lb 6.7 oz)   SpO2 100%   BMI 19.87 kg/m  ECOG: (1) Restricted in physically strenuous activity, ambulatory and able to do work of light nature General appearance: alert, appears stated age, and cooperative CV: RRR, audible mechanical valve S2 Lungs: normal work of breathing Abdomen: Soft, TTP in RUQ, non-distended Skin: Skin color, texture, turgor normal. No rashes or lesions Neurologic: Grossly normal Incisions: GJ tube in place   Imaging/Studies:  Imaging studies reviewed as described above in HPI. No images are attached to the encounter.   Labs: Results for orders placed or performed during the hospital encounter of 04/08/24  Request for image library services   Narrative   Please refer to the appropriate PACS to view images.      Assessment:    ICD-10-CM   1. 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  Prior imaging demonstrated marked calcifications in the head of the pancreas and significant main pancreatic ductal dilation, consistent with his described symptoms.    Discussed with patient the chronic nature of recurrent pancreatitis.  Given recent GJ tube revision and marked inflammation on recent imaging, discussed that surgical intervention is not currently advised.  He has an appointment in place for an ERCP at [facility] on 05/04/24, however would like to consider obtaining ERCP at [facility].  Will plan to discuss his case at [facility] next week, with consideration for ERCP at [facility] and will update patient accordingly.  Plan:  We have reviewed imaging, labs, and procedures as described above.   - Consideration of ERCP at [facility], will discuss at [facility] - RTC in two months with repeat CT pancreas protocol w/ contrast, CBC, CMP  He and his family are in agreement with this plan of care. We appreciate the opportunity to participate in his care.   Student Documentation Attestation: I saw [name] [name] in the presence of supervising attending physician, Dr. Dasie.  ROLLO CHIHUAHUA, Med Student  Will continue tube feeds for now.  Will discuss at [facility].  Likely referral to [facility].

## 2024-04-10 ENCOUNTER — Encounter: Payer: Self-pay | Admitting: Internal Medicine

## 2024-04-10 ENCOUNTER — Telehealth: Payer: Self-pay | Admitting: Internal Medicine

## 2024-04-10 ENCOUNTER — Other Ambulatory Visit: Payer: Self-pay | Admitting: Radiology

## 2024-04-10 DIAGNOSIS — K9423 Gastrostomy malfunction: Secondary | ICD-10-CM

## 2024-04-10 NOTE — Telephone Encounter (Signed)
 Pt calling back about mychart message that was sent about an hour ago.

## 2024-04-10 NOTE — Telephone Encounter (Signed)
 Patient calling in regards to MyChart message. Please avise.

## 2024-04-13 ENCOUNTER — Inpatient Hospital Stay: Admitting: Family

## 2024-04-13 ENCOUNTER — Other Ambulatory Visit: Payer: Self-pay | Admitting: Internal Medicine

## 2024-04-13 ENCOUNTER — Other Ambulatory Visit (HOSPITAL_COMMUNITY): Payer: Self-pay

## 2024-04-13 ENCOUNTER — Ambulatory Visit (HOSPITAL_COMMUNITY)
Admission: RE | Admit: 2024-04-13 | Discharge: 2024-04-13 | Disposition: A | Discharge status: Home / Self Care | Source: Ambulatory Visit | Attending: Student | Admitting: Student

## 2024-04-13 ENCOUNTER — Other Ambulatory Visit (HOSPITAL_COMMUNITY): Payer: Self-pay | Admitting: Student

## 2024-04-13 DIAGNOSIS — K859 Acute pancreatitis without necrosis or infection, unspecified: Secondary | ICD-10-CM

## 2024-04-13 DIAGNOSIS — K9423 Gastrostomy malfunction: Secondary | ICD-10-CM

## 2024-04-13 DIAGNOSIS — R112 Nausea with vomiting, unspecified: Secondary | ICD-10-CM

## 2024-04-13 DIAGNOSIS — R109 Unspecified abdominal pain: Secondary | ICD-10-CM

## 2024-04-13 DIAGNOSIS — Z952 Presence of prosthetic heart valve: Secondary | ICD-10-CM

## 2024-04-13 DIAGNOSIS — T85698A Other mechanical complication of other specified internal prosthetic devices, implants and grafts, initial encounter: Secondary | ICD-10-CM

## 2024-04-13 HISTORY — PX: IR GJ TUBE CHANGE: IMG1440

## 2024-04-13 MED ORDER — OXYCODONE HCL 10 MG PO TABS: 10.0000 mg | ORAL_TABLET | ORAL | 0 refills | Status: DC | PRN

## 2024-04-13 MED ORDER — IOHEXOL 300 MG/ML  SOLN
100.0000 mL | Freq: Once | INTRAMUSCULAR | Status: AC | PRN
  Administered 2024-04-13: 25 mL

## 2024-04-13 MED ORDER — LIDOCAINE VISCOUS HCL 2 % MT SOLN
OROMUCOSAL | Status: AC
  Filled 2024-04-13: qty 15

## 2024-04-13 MED ORDER — OXYCODONE HCL 10 MG PO TABS
10.0000 mg | ORAL_TABLET | ORAL | 0 refills | Status: DC | PRN
  Filled 2024-04-13: qty 84, 14d supply, fill #0

## 2024-04-13 NOTE — Progress Notes (Signed)
 Refill of oxycodone  10 mg, 1 tab every 6 hrs PRN severe pain from pancreatitis (acute and chronic) 14 day supply = 84 tablets JMP

## 2024-04-13 NOTE — Telephone Encounter (Signed)
 I will send refill of pain meds

## 2024-04-13 NOTE — Progress Notes (Signed)
 Pharmacy change JMP

## 2024-04-13 NOTE — Telephone Encounter (Signed)
 I will send refill of pain meds IR eval for tube management is appropriate Do we know if his [facility] surgery consult eval with Dr. Zani has been acted on?

## 2024-04-14 ENCOUNTER — Other Ambulatory Visit (HOSPITAL_COMMUNITY): Payer: Self-pay | Admitting: Radiology

## 2024-04-14 ENCOUNTER — Inpatient Hospital Stay: Admitting: Family

## 2024-04-14 ENCOUNTER — Telehealth (HOSPITAL_COMMUNITY): Payer: Self-pay | Admitting: Radiology

## 2024-04-14 DIAGNOSIS — R633 Feeding difficulties, unspecified: Secondary | ICD-10-CM

## 2024-04-14 NOTE — Progress Notes (Deleted)
 Patient ID: [name] [name], male    DOB: Jul 19, 1990, 33 y.o.   MRN: [id number]  No chief complaint on file.   Subjective:    Outpatient Medications Prior to Visit  Medication Sig Dispense Refill   acetaminophen  (TYLENOL ) 500 MG tablet Take 1,000 mg by mouth every 6 (six) hours as needed for moderate pain (pain score 4-6).     aspirin  EC 81 MG EC tablet Take 1 tablet (81 mg total) by mouth daily. Swallow whole. 30 tablet 11   enoxaparin  (LOVENOX ) 60 MG/0.6ML injection Inject 0.6 mLs (60 mg total) into the skin every 12 (twelve) hours. 5 mL 0   hydrOXYzine  (ATARAX ) 25 MG tablet Take 1 tablet (25 mg total) by mouth 3 (three) times daily as needed for anxiety. 30 tablet 0   lipase/protease/amylase (CREON ) 36000 UNITS CPEP capsule Take 2 capsules (72,000 Units total) by mouth 3 (three) times daily before meals.     loratadine  (CLARITIN ) 10 MG tablet Take 10 mg by mouth daily.     methocarbamol  (ROBAXIN ) 500 MG tablet Take 1-2 tablets (500-1,000 mg total) by mouth 4 (four) times daily. (Patient taking differently: Take 500 mg by mouth daily as needed for muscle spasms.) 30 tablet 0   metoprolol  succinate (TOPROL  XL) 25 MG 24 hr tablet Take 0.5 tablets (12.5 mg total) by mouth at bedtime. 45 tablet 2   ondansetron  (ZOFRAN -ODT) 4 MG disintegrating tablet Take 4 mg by mouth every 8 (eight) hours as needed.     Oxycodone  HCl 10 MG TABS Take 1 tablet (10 mg total) by mouth every 4 (four) hours as needed. 84 tablet 0   pantoprazole  (PROTONIX ) 40 MG tablet Take 1 tablet (40 mg total) by mouth 2 (two) times daily before a meal. 60 tablet 0   polyethylene glycol powder (GLYCOLAX /MIRALAX ) 17 GM/SCOOP powder Place 1 capful (17 g) into feeding tube 2 (two) times daily. (Patient taking differently: Take 17 g by mouth daily as needed for mild constipation.) 238 g 0   warfarin (COUMADIN ) 3 MG tablet TAKE 1 & 1/2 TABLETS TO 2 TABLETS BY MOUTH DAILY OR AS DIRECTED BY COAGULATION CLINIC 140 tablet 0    No facility-administered medications prior to visit.   Past Medical History:  Diagnosis Date   Abnormal finding on GI tract imaging 12/07/2023   Aortic valve calcification 06/08/2020   Asthma    Headache    migraines   Heart murmur    Hypokalemia 06/08/2020   Leukocytosis 01/23/2022   Mild aortic stenosis    Pancreatitis    Pneumonia    as a child x several   Substance abuse (HCC)    Subtherapeutic international normalized ratio (INR) 01/23/2022   Transaminitis 06/08/2020   Past Surgical History:  Procedure Laterality Date   AORTIC VALVE REPLACEMENT N/A 09/27/2021   Procedure: AORTIC VALVE REPLACEMENT (AVR) USING ON-X AORTIC VALVE SIZE ;  Surgeon: Kerrin Elspeth BROCKS, MD;  Location: [facility] OR;  Service: Open Heart Surgery;  Laterality: N/A;   BIOPSY  08/01/2023   Procedure: BIOPSY;  Surgeon: Wilhelmenia Aloha Raddle., MD;  Location: [facility];  Service: Gastroenterology;;   ERCP N/A 03/09/2024   Procedure: ERCP, WITH INTERVENTION IF INDICATED;  Surgeon: Wilhelmenia Aloha Raddle., MD;  Location: [facility];  Service: Gastroenterology;  Laterality: N/A;  aborted   ESOPHAGOGASTRODUODENOSCOPY N/A 08/01/2023   Procedure: ESOPHAGOGASTRODUODENOSCOPY (EGD);  Surgeon: Wilhelmenia Aloha Raddle., MD;  Location: [facility];  Service: Gastroenterology;  Laterality: N/A;   ESOPHAGOGASTRODUODENOSCOPY N/A 03/09/2024  Procedure: EGD (ESOPHAGOGASTRODUODENOSCOPY);  Surgeon: Wilhelmenia Aloha Raddle., MD;  Location: [facility];  Service: Gastroenterology;  Laterality: N/A;   EUS N/A 08/01/2023   Procedure: UPPER ENDOSCOPIC ULTRASOUND (EUS) RADIAL;  Surgeon: Wilhelmenia Aloha Raddle., MD;  Location: [facility];  Service: Gastroenterology;  Laterality: N/A;   FINE NEEDLE ASPIRATION N/A 08/01/2023   Procedure: FINE NEEDLE ASPIRATION (FNA) LINEAR;  Surgeon: Wilhelmenia Aloha Raddle., MD;  Location: [facility];  Service: Gastroenterology;  Laterality: N/A;   INGUINAL HERNIA REPAIR Right 01/05/2022    Procedure: OPEN REPAIR INCARCERATED RIGHT INGUINAL HERNIA WITH MESH;  Surgeon: Tanda Locus, MD;  Location: [facility];  Service: General;  Laterality: Right;  90 MIN - ROOM 2   INGUINAL HERNIA REPAIR Left 12/20/2022   Procedure: OPEN REPAIR LEFT INGUINAL HERNIA WITH MESH;  Surgeon: Tanda Locus, MD;  Location: [facility];  Service: General;  Laterality: Left;   IR GASTR TUBE CONVERT GASTR-JEJ PER W/FL MOD SED  02/13/2024   IR GASTROSTOMY TUBE MOD SED  02/13/2024   IR GASTROSTOMY TUBE MOD SED  04/03/2024   IR GJ TUBE CHANGE  03/19/2024   IR GJ TUBE CHANGE  03/30/2024   IR GJ TUBE CHANGE  04/13/2024   IR US  GUIDE BX ASP/DRAIN  04/03/2024   REPLACEMENT ASCENDING AORTA N/A 09/27/2021   Procedure: REPLACEMENT ASCENDING AND INNOMINATE ANEURYSM USING HEMASHIELD PLATINUM [id number];  Surgeon: Kerrin Elspeth BROCKS, MD;  Location: [facility] OR;  Service: Open Heart Surgery;  Laterality: N/A;   RIGHT HEART CATH AND CORONARY ANGIOGRAPHY N/A 09/07/2021   Procedure: RIGHT HEART CATH AND CORONARY ANGIOGRAPHY;  Surgeon: Wendel Lurena POUR, MD;  Location: [facility];  Service: Cardiovascular;  Laterality: N/A;   TEE WITHOUT CARDIOVERSION N/A 09/27/2021   Procedure: TRANSESOPHAGEAL ECHOCARDIOGRAM (TEE);  Surgeon: Kerrin Elspeth BROCKS, MD;  Location: [facility] OR;  Service: Open Heart Surgery;  Laterality: N/A;   Allergies  Allergen Reactions   Amoxicillin Hives      Objective:    Physical Exam Vitals and nursing note reviewed.  Constitutional:      General: He is not in acute distress.    Appearance: Normal appearance.  HENT:     Head: Normocephalic.  Cardiovascular:     Rate and Rhythm: Normal rate and regular rhythm.  Pulmonary:     Effort: Pulmonary effort is normal.     Breath sounds: Normal breath sounds.  Musculoskeletal:        General: Normal range of motion.     Cervical back: Normal range of motion.  Skin:    General: Skin is warm and dry.  Neurological:     Mental Status: He is alert and oriented to  person, place, and time.  Psychiatric:        Mood and Affect: Mood normal.    There were no vitals taken for this visit. Wt Readings from Last 3 Encounters:  04/08/24 137 lb 12.6 oz (62.5 kg)  03/25/24 140 lb (63.5 kg)  03/09/24 145 lb (65.8 kg)       Lucius Krabbe, NP

## 2024-04-14 NOTE — Telephone Encounter (Signed)
 Patient called stating that he is unable to flush his J port. Last exchanged  on 11.3.25 when the j limb was found to be flipped into the stomach. Recommend that he come into the hospital for further evaluation. Patient requesting that he come in on Tuesday. Message sent to central schedulers to add Patient to the schedule for 11.11.25.  IR scheduler will call with appoinment  time.

## 2024-04-15 NOTE — Progress Notes (Signed)
 PROVIDER:  LEONOR MACARIO DAWN, MD  MRN: [id number] DOB: 15-Feb-1991 DATE OF ENCOUNTER: 04/15/2024 Subjective     Chief Complaint: NEW PROBLEM     History of Present Illness: [name] [name] is a 33 y.o. male who is seen today for follow up of pancreatitis.   Previous history: He has a history of recurrent acute pancreatitis with prior EtOH use, starting in 2020. He has had multiple episodes of pancreatitis since then, now with signs of chronic pancreatitis. He had an EUS by Dr. Wilhelmenia in February of this year. This showed a cystic lesion in the head of the pancreas consistent with a pseudocyst, and a 5mm stone within the PD at the junction of the head/neck. Cytology of the cyst was benign. He has continued to have pain and has had several hospitalizations this year, most recently at [facility] from 8/2 to 9/8, after presenting with severe pain. His imaging during admission showed changes of chronic pancreatitis with calcifications in the head of the pancreas, as well as gland atrophy and main PD dilation. He also had signs of acute pancreatitis with stranding around the pancreas and a new fluid collection anterior to the pancreas.  He also had nausea with PO intolerance and transient gastric pneumatosis, which resolved with gastric decompression and bowel rest. He has had significant weight loss over the last year, and ultimately underwent placement of a GJ by IR on 9/4. He was discharged home on cycled J tube feeds, which he has been tolerating well. His weight today is stable compared to his weight in the hospital. He is only taking small amounts of clear liquids by mouth, and is anxious about trying more due to his frequent pain and nausea. He is scheduled for an ERCP with Dr. Wilhelmenia on 9/29.    His only prior abdominal surgery is a bilateral inguinal hernia repair. He has had an aortic valve replacement and is on Coumadin . He was bridged with Lovenox  at discharge and has not followed up in the  Coumadin  clinic yet. He does not smoke and says he stopped drinking alcohol almost a year ago.  Interval history: Since patient's last visit on 9/15, he was admitted to the hospital with recurrent pain. He had  his GJ tube replaced by IR as it became clogged. It was replaced two days ago, and he reports it clogged again the first time he tried to use it. He has not been able to use it since then. He is eating very small amounts as he is afraid to eat due to pain. He was also see at [facility] to discuss surgery, and will follow up again there in 2 months with a repeat CT scan. His ERCP on 9/29 showed filling of the entire pancreatic duct, but there was stricturing of the proximal PD with a significant stone burden. The stones could not be extracted.   He continues to have frequent abdominal pain, and recently pain meds were refilled by GI. He is interested in a referral to pain management.     Review of Systems: A complete review of systems was obtained from the patient.  I have reviewed this information and discussed as appropriate with the patient.  See HPI as well for other ROS.    Medical History: Past Medical History:  Diagnosis Date  . Arrhythmia   . Asthma, unspecified asthma severity, unspecified whether complicated, unspecified whether persistent (HHS-HCC)   . Heart valve disease     Patient Active Problem List  Diagnosis  .  Bicuspid aortic valve (HHS-HCC)  . Mild aortic stenosis  . Unilateral inguinal hernia without obstruction or gangrene    Past Surgical History:  Procedure Laterality Date  . ASCENDING AORTIC ANEURYSM REPAIR W/ MECHANICAL AORTIC VALVE REPLACEMENT       Allergies  Allergen Reactions  . Amoxicillin Hives    Current Outpatient Medications on File Prior to Visit  Medication Sig Dispense Refill  . aspirin  81 MG EC tablet Take 81 mg by mouth once daily    . enoxaparin  (LOVENOX ) 60 mg/0.6 mL injection syringe Inject 60 mg subcutaneously every 12 (twelve)  hours    . loratadine  (CLARITIN ) 10 mg tablet Take 10 mg by mouth once daily    . metoprolol  succinate (TOPROL -XL) 25 MG XL tablet Take 12.5 mg by mouth at bedtime    . ondansetron  (ZOFRAN -ODT) 4 MG disintegrating tablet Take 1 tablet (4 mg total) by mouth every 8 (eight) hours as needed for Nausea or Vomiting 20 tablet 2  . warfarin (COUMADIN ) 3 MG tablet TAKE 1 AND 1/2 TABLETS BY MOUTH DAILY. EXCEPT TAKE 2 TABLETS ON MONDAYS, WEDNESDAYS AND FRIDAYS OR AS DIRECTED BY COAGULATION CLINIC    . oxyCODONE  (ROXICODONE ) 5 MG immediate release tablet Take 1 tablet (5 mg total) by mouth every 6 (six) hours as needed (severe pain) (Patient not taking: Reported on 04/09/2024) 20 tablet 0   No current facility-administered medications on file prior to visit.    Family History  Problem Relation Age of Onset  . Colon cancer Maternal Grandmother   . Prostate cancer Maternal Grandfather   . Prostate cancer Paternal Grandfather      Social History   Tobacco Use  Smoking Status Never  Smokeless Tobacco Never     Social History   Socioeconomic History  . Marital status: Life Partner  Tobacco Use  . Smoking status: Never  . Smokeless tobacco: Never  Vaping Use  . Vaping status: Never Used  Substance and Sexual Activity  . Alcohol use: Not Currently  . Drug use: Yes    Types: Marijuana  . Sexual activity: Defer   Social Drivers of Health   Financial Resource Strain: Low Risk  (04/25/2023)   Received from [facility]   Overall Financial Resource Strain (CARDIA)   . Difficulty of Paying Living Expenses: Not very hard  Food Insecurity: No Food Insecurity (03/29/2024)   Received from [facility]   Hunger Vital Sign   . Within the past 12 months, you worried that your food would run out before you got the money to buy more.: Never true   . Within the past 12 months, the food you bought just didn't last and you didn't have money to get more.: Never true  Transportation Needs: No  Transportation Needs (03/29/2024)   Received from [facility] - Transportation   . In the past 12 months, has lack of transportation kept you from medical appointments or from getting medications?: No   . In the past 12 months, has lack of transportation kept you from meetings, work, or from getting things needed for daily living?: No  Physical Activity: Sufficiently Active (04/25/2023)   Received from [facility]   Exercise Vital Sign   . On average, how many days per week do you engage in moderate to strenuous exercise (like a brisk walk)?: 5 days   . On average, how many minutes do you engage in exercise at this level?: 150+ min  Stress: No Stress Concern Present (04/25/2023)  Received from [facility] - Occupational Stress Questionnaire   . Feeling of Stress : Only a little  Social Connections: Unknown (04/25/2023)   Received from [facility]   Social Connection and Isolation Panel   . In a typical week, how many times do you talk on the phone with family, friends, or neighbors?: Three times a week   . How often do you get together with friends or relatives?: Once a week   . How often do you attend church or religious services?: Patient declined   . Do you belong to any clubs or organizations such as church groups, unions, fraternal or athletic groups, or school groups?: No   . Are you married, widowed, divorced, separated, never married, or living with a partner?: Never married  Housing Stability: Unknown (02/24/2024)   Housing Stability Vital Sign   . Homeless in the Last Year: No    Objective:    Vitals:   04/15/24 0948 04/15/24 0950  BP: 111/74   Pulse: 65   Temp: 36.7 C (98.1 F)   SpO2: 99%   Weight: 65 kg (143 lb 3.2 oz)   Height: 180.3 cm (5' 11)   PainSc:    5  PainLoc:  Abdomen    Body mass index is 19.97 kg/m.  Physical Exam Constitutional:      General: He is not in acute distress.    Appearance:  Normal appearance.  Pulmonary:     Effort: Pulmonary effort is normal. No respiratory distress.  Abdominal:     General: There is no distension.     Palpations: Abdomen is soft.     Tenderness: There is no abdominal tenderness.     Comments: GJ tube in place LUQ.  Skin:    General: Skin is warm and dry.     Coloration: Skin is not jaundiced.  Neurological:     General: No focal deficit present.     Mental Status: He is alert and oriented to person, place, and time.         Assessment and Plan:     Diagnoses and all orders for this visit:  Idiopathic chronic pancreatitis (CMS/HHS-HCC) -     Ambulatory Referral to Pain Clinic  Chronic abdominal pain, unspecified  Feeding tube dysfunction, initial encounter     33 yo male with chronic EtOH-related pancreatitis, with stricturing in the head of the pancreas and stones. He is receiving nutritional support via tube feeds, but his J tube has been clogged multiple times. We were able to clear it in the office today by flushing with Creon  dissolved in warm water . He may resume using the tube immediately. Will also refer him to a pain management specialist for long-term management of his chronic abdominal pain. He is planning to continue follow up with surgery at [facility]. He may see me as needed, and was encouraged to call with further questions or concerns.  Return if symptoms worsen or fail to improve.   SHELBY LYNN ALLEN, MD

## 2024-04-21 ENCOUNTER — Ambulatory Visit (HOSPITAL_COMMUNITY)
Admission: RE | Admit: 2024-04-21 | Discharge: 2024-04-21 | Disposition: A | Discharge status: Home / Self Care | Source: Ambulatory Visit | Attending: Radiology | Admitting: Radiology

## 2024-04-21 ENCOUNTER — Other Ambulatory Visit (HOSPITAL_COMMUNITY): Payer: Self-pay | Admitting: Radiology

## 2024-04-21 DIAGNOSIS — Z434 Encounter for attention to other artificial openings of digestive tract: Secondary | ICD-10-CM | POA: Diagnosis present

## 2024-04-21 DIAGNOSIS — R633 Feeding difficulties, unspecified: Secondary | ICD-10-CM | POA: Insufficient documentation

## 2024-04-21 HISTORY — PX: IR CM INJ ANY COLONIC TUBE W/FLUORO: IMG2336

## 2024-04-21 MED ORDER — LIDOCAINE VISCOUS HCL 2 % MT SOLN
OROMUCOSAL | Status: AC
  Filled 2024-04-21: qty 15

## 2024-04-21 MED ORDER — IOHEXOL 300 MG/ML  SOLN
50.0000 mL | Freq: Once | INTRAMUSCULAR | Status: AC | PRN
  Administered 2024-04-21: 15 mL

## 2024-04-27 ENCOUNTER — Encounter (HOSPITAL_COMMUNITY): Payer: Self-pay | Admitting: Gastroenterology

## 2024-04-27 ENCOUNTER — Other Ambulatory Visit: Payer: Self-pay | Admitting: Internal Medicine

## 2024-04-27 ENCOUNTER — Telehealth: Payer: Self-pay | Admitting: Gastroenterology

## 2024-04-27 ENCOUNTER — Telehealth: Payer: Self-pay

## 2024-04-27 DIAGNOSIS — K859 Acute pancreatitis without necrosis or infection, unspecified: Secondary | ICD-10-CM

## 2024-04-27 MED ORDER — OXYCODONE HCL 10 MG PO TABS: 10.0000 mg | ORAL_TABLET | ORAL | 0 refills | Status: DC | PRN

## 2024-04-27 NOTE — Telephone Encounter (Addendum)
 Procedure:ERCP Procedure date: 05/04/24 Procedure location: [facility] Arrival Time: 6:30 am Spoke with the patient Y/N: Yes Any prep concerns? No  Has the patient obtained the prep from the pharmacy ? No prep needed Do you have a care partner and transportation: Yes Any additional concerns? No

## 2024-04-27 NOTE — Telephone Encounter (Signed)
 [facility] Pre-operative Risk Assessment     Request for surgical clearance:     Endoscopy Procedure  What type of surgery is being performed?     ERCP  When is this surgery scheduled?     05/04/24  What type of clearance is required ?   Medical   Pre op call [name] [name]    PCP-Hudnell NP CardiologistGLENWOOD Red MD Pulmonologist- n/a  EKG-03/27/24 Echo-12/03/22 Cath-09/07/21 Stress-n/a ICD/PM-n/a GLP1-n/a Blood Thinner-Coumadin  hold 5 days last dose 11/18  History: Murmur, Asthma, Aortic Stenosis s/p aortic valve replacement, GJ tube. Had ERCP 02/2024 with no issues. Does see cards last visit 02/2023 and saw coumadin  clinic in march 2025. I asked if due to see cards soon and he said yes I need to make an appt. Patient denies any heart or breathing issues.  Anesthesia Review- Yes   Are there any medications that need to be held prior to surgery and how long? Coumadin  lovenox   Practice name and name of physician performing surgery?      [facility]  What is your office phone and fax number?      Phone- [phone]  Fax- [phone]  Anesthesia type (None, local, MAC, general) ?       MAC   Please route your response to Odetta Curly ASAP thank you

## 2024-04-27 NOTE — Progress Notes (Signed)
 Pre op call Glennie Storrs    PCP-Hudnell NP CardiologistGLENWOOD Red MD Pulmonologist- n/a  EKG-03/27/24 Echo-12/03/22 Cath-09/07/21 Stress-n/a ICD/PM-n/a GLP1-n/a Blood Thinner-Coumadin  hold 5 days last dose 11/18  History: Murmur, Asthma, Aortic Stenosis s/p aortic valve replacement, GJ tube. Had ERCP 02/2024 with no issues. Does see cards last visit 02/2023 and saw coumadin  clinic in march 2025. I asked if due to see cards soon and he said yes I need to make an appt. Patient denies any heart or breathing issues.  Anesthesia Review- Yes- needs cardiac clearance since recommended 6 month f/u after last cardiac appt. GI office notified 11/17

## 2024-04-27 NOTE — Progress Notes (Signed)
 Refill oxycodone  x  14 day supply Pancreatitis Pain management pending, [facility] panc surg consult pending

## 2024-04-28 NOTE — Telephone Encounter (Signed)
 This pt also needs Medical cardiac clearance please

## 2024-04-30 ENCOUNTER — Telehealth: Payer: Self-pay | Admitting: *Deleted

## 2024-04-30 DIAGNOSIS — Z952 Presence of prosthetic heart valve: Secondary | ICD-10-CM

## 2024-04-30 NOTE — Telephone Encounter (Signed)
 Is the pt also cleared from a medical standpoint? The anesthesia team at [facility] endo wants clearance as well.

## 2024-04-30 NOTE — Telephone Encounter (Signed)
 Received message from Dorsey gower D who had received clearance request for pt's upcoming procedure on 05/04/2024  Pt has not had his INR checked by our clinic since 09/05/2023.   Called pt and LMOM for him to call the clinic back.

## 2024-04-30 NOTE — Telephone Encounter (Signed)
 I am not involved in medical clearance. Would contact MD. I am assessing anticoagulation hold. The patient has not been seen in our Coumadin  clinic since March 2025. Our team has attempted to contact patient with NR.

## 2024-04-30 NOTE — Telephone Encounter (Signed)
 Called and spoke to pt who stated that he could come in tomorrow to have INR checked.

## 2024-05-01 ENCOUNTER — Ambulatory Visit: Admitting: Internal Medicine

## 2024-05-01 ENCOUNTER — Telehealth: Payer: Self-pay

## 2024-05-01 ENCOUNTER — Ambulatory Visit

## 2024-05-01 ENCOUNTER — Telehealth: Payer: Self-pay | Admitting: *Deleted

## 2024-05-01 ENCOUNTER — Telehealth (HOSPITAL_COMMUNITY): Payer: Self-pay | Admitting: *Deleted

## 2024-05-01 NOTE — Telephone Encounter (Signed)
 Patient has not had INR checked at [facility] since 09/05/23. Will need to hold warfarin for 5 days before procedure. He was scheduled for an INR check today but his INR today would not be helpful since patient is already supposed to be holding his warfarin. Has not been seen by Dr Wendel or APP since 02/15/23. Recommend rescheduling procedure. Plan to contact patient to reschedule INR appointment today.

## 2024-05-01 NOTE — Telephone Encounter (Signed)
 Since it is felt that he needs to be seen in clinic for follow-up and optimization, understand the need for rescheduling his procedure. Putting his primary GI on here, so that he is aware of the need for rescheduling. Based on my availability, if the patient is going to be rescheduled he will not be rescheduled until mid-to-late January or February based on my availability currently. Patient could be sent to [facility] for further evaluation as Dr. Albertus and I had previously discussed. Will allow Dr. Albertus to make that decision. Once that decision is made, then work on rescheduling with me or referral to [facility] advanced endoscopy. Thanks. GM

## 2024-05-01 NOTE — Telephone Encounter (Signed)
 Spoke with pt regarding appt needed for preop clearance. Pt was advised that we would be able to see him after his coumadin  appt today. Pt stated his surgeon's office did not need a clearance for his last procedure and asked why was this appt neccassary and was considering cancelling his procedure. Pt was advised not to cancel his procedure and discuss with his provider at the time of his appt. Pt agreed. Pt was advise to check in at the main lobby after his coumadin  appt. Pt agreed.

## 2024-05-01 NOTE — Telephone Encounter (Signed)
 Dr Wilhelmenia see note below regarding procedure needing to be rescheduled per cardiology

## 2024-05-01 NOTE — Telephone Encounter (Signed)
   Name:  Ohm Raymond Rexford Mulgrew  DOB:  02-Sep-1990  MRN:  [id number]   Primary Cardiologist: Arun K Thukkani, MD  Chart reviewed as part of pre-operative protocol coverage. Patient was contacted 05/01/2024 in reference to pre-operative risk assessment for pending surgery as outlined below.  Ian Raymond Rexford Roach was last seen on 02/15/2023 by Since that day, Braheem Raymond Rexford Stanis   has not had INR checked at [facility] since 09/05/23. Will need to hold warfarin for 5 days before procedure. He was scheduled for an INR check today but his INR today would not be helpful since patient is already supposed to be holding his warfarin. Has not been seen by Dr Wendel or APP since 02/15/23. Recommend rescheduling procedure. Plan to contact patient to reschedule INR appointment today.      Pre-op covering staff: - Please schedule appointment and call patient to inform them. If patient already had an upcoming appointment within acceptable timeframe, please add pre-op clearance to the appointment notes so provider is aware. - Please contact requesting surgeon's office via preferred method (i.e, phone, fax) to inform them of need for appointment prior to surgery.  Lamarr Satterfield, NP 05/01/2024, 7:43 AM

## 2024-05-01 NOTE — Telephone Encounter (Signed)
 [name] [name] was scheduled for Monday, November 23. 2025 (Procedure) with Dr. Wilhelmenia for ERCP  (date), at [facility].   Patient/or family called on 05/01/2024 (date) to cancel their procedure due to patient calling to cancel (reason)  Dr Wilhelmenia & office notified. Patient instructed to call physician's office to reschedule their procedure. Patient demonstrated understanding.

## 2024-05-01 NOTE — Progress Notes (Deleted)
 Cardiology Office Note:   Date:  05/01/2024  ID:  [name] [name], DOB 1991/05/27, MRN [id number] PCP:  Lucius Krabbe, NP  [facility] Providers Cardiologist:  Wendel Haws, MD Referring MD: Lucius Krabbe, NP   Chief Complaint/Reason for Referral: General cardiology follow-up; preoperative evaluation ASSESSMENT:    1. Preoperative cardiovascular examination   2. S/P AVR (aortic valve replacement) and aortoplasty   3. Secondary hypercoagulable state   4. Primary hypertension   5. Alcohol-induced chronic pancreatitis (HCC)      PLAN:   In order of problems listed above: Preoperative cardiovascular assessment: Status post aortic valve replacement: Patient to hold Coumadin  for 5 days prior to ERCP with Lovenox  bridge.*** Hypertension: Continue Toprol  12.5 mg daily Chronic pancreatitis: Continue Creon  72,000 units 3 times a day.             Dispo:  No follow-ups on file.      Medication Adjustments/Labs and Tests Ordered: Current medicines are reviewed at length with the patient today.  Concerns regarding medicines are outlined above.  The following changes have been made:  no change   Labs/tests ordered: No orders of the defined types were placed in this encounter.   Medication Changes: No orders of the defined types were placed in this encounter.   Current medicines are reviewed at length with the patient today.  The patient does not have concerns regarding medicines.  History of Present Illness:   FOCUSED PROBLEM LIST:   Bicuspid aortic valve with mixed AI and AS Status post AVR 23 mm On-X mechanical valve and [place] April 2023 LP(a) 92.5 Chronic pancreatitis Gastric jejunostomy tube placed August 2025  September 2024:  The patient is a 33 y.o. male with the indicated medical history here for cardiology follow-up.  The patient was last seen in our clinic in May prior to elective hernia repair.  His blood pressure was noted to be  elevated at that appointment.  His Coumadin  was held for 5 days prior to the procedure.  He had his procedure done in July.  The patient is doing very well.  He is quite active and is lost a number of pounds.  He feels better than he did prior to his surgery.  He has been swimming on a regular basis and is thinking about changing to a different activity.  He is back working full-time.  He has had no cardiovascular issues recently.  He did note a few elevated blood pressures around the time of his hernia surgery.  He has had no severe bleeding or bruising episodes, need for hospitalization, or need for ER evaluation.  Plan: Check lipid panel and LP(a).  November 2025:  Patient consents to use of AI scribe. The interim the patient's LDL was 107.  His LP(a) was elevated.  Diet and exercise modification was recommended.  Unfortunately he was admitted to the hospital with recurrent pancreatitis which has become complicated.  He needed a feeding tube placed which was done in October.  Current Medications: No outpatient medications have been marked as taking for the 05/01/24 encounter (Appointment) with Gilman Olazabal K, MD.     Allergies:    Amoxicillin   Social History:   Social History   Tobacco Use   Smoking status: Never   Smokeless tobacco: Never  Vaping Use   Vaping status: Never Used  Substance Use Topics   Alcohol use: Not Currently    Comment: none x 1 month as od 04/11/2023   Drug use: Not  Currently    Types: Other-see comments    Comment: Edibles occasionally marijuana     Family Hx: Family History  Problem Relation Age of Onset   Pancreatitis Maternal Uncle    Colon cancer Neg Hx    Esophageal cancer Neg Hx      Review of Systems:   Please see the history of present illness.    All other systems reviewed and are negative.     EKGs/Labs/Other Test Reviewed:   EKG: 2025 sinus rhythm with nonspecific T wave abnormality  EKG Interpretation Date/Time:    Ventricular  Rate:    PR Interval:    QRS Duration:    QT Interval:    QTC Calculation:   R Axis:      Text Interpretation:          Prior CV studies reviewed: Cardiac Studies & Procedures   ______________________________________________________________________________________________ CARDIAC CATHETERIZATION  CARDIAC CATHETERIZATION 09/07/2021  Conclusion 1.  Normal right dominant circulation. 2.  Normal cardiac output and index with with normal filling pressures with a mean PA pressure of 15 mmHg, wedge pressure of 11 mmHg, and mean right atrial pressure of 4 mmHg.  Recommendation: Cardiothoracic surgery evaluation.  Findings Coronary Findings Diagnostic  Dominance: Right  No diagnostic findings have been documented. Intervention  No interventions have been documented.     ECHOCARDIOGRAM  ECHOCARDIOGRAM COMPLETE 12/03/2022  Narrative ECHOCARDIOGRAM REPORT    Patient Name:   [name] [name] Date of Exam: 12/03/2022 Medical Rec #:  [id number]                      Height:       71.0 in Accession #:    [id number]                     Weight:       185.0 lb Date of Birth:  Oct 29, 1990                       BSA:          2.040 m Patient Age:    33 years                       BP:           154/98 mmHg Patient Gender: M                              HR:           69 bpm. Exam Location:  [address]  Procedure: 2D Echo, Cardiac Doppler, Color Doppler and 3D Echo  Indications:     Z95.2 S/P Aortic valve replacement (ON-X 23mm)  History:         Patient has prior history of Echocardiogram examinations, most recent 11/07/2021. Signs/Symptoms:Murmur. Aortic stenosis. Asthma. History of aortic aneurysm repair. Bicuspid aorttic valve. Aortic Valve: 23 mm On-X mechanical valve is present in the aortic position.  Sonographer:     Jon Hacker RCS Referring Phys:  ORREN SAILOR CONTE Diagnosing Phys: Lonni Nanas MD  IMPRESSIONS   1. Left ventricular ejection  fraction, by estimation, is 65 to 70%. The left ventricle has normal function. The left ventricle has no regional wall motion abnormalities. There is mild left ventricular hypertrophy. Left ventricular diastolic parameters were normal. 2. Right ventricular systolic function is normal. The right ventricular size is  normal. Tricuspid regurgitation signal is inadequate for assessing PA pressure. 3. The mitral valve is normal in structure. Trivial mitral valve regurgitation. No evidence of mitral stenosis. 4. The aortic valve has been repaired/replaced. Aortic valve regurgitation is trivial. There is a 23 mm On-X mechanical valve present in the aortic position. Echo findings are consistent with normal structure and function of the aortic valve prosthesis. Aortic valve mean gradient measures 11.0 mmHg. 5. Aortic root/ascending aorta has been repaired/replaced. Hemashield graft repair of the ascending aorta 6. The inferior vena cava is normal in size with greater than 50% respiratory variability, suggesting right atrial pressure of 3 mmHg.  FINDINGS Left Ventricle: Left ventricular ejection fraction, by estimation, is 65 to 70%. The left ventricle has normal function. The left ventricle has no regional wall motion abnormalities. The left ventricular internal cavity size was normal in size. There is mild left ventricular hypertrophy. Left ventricular diastolic parameters were normal.  Right Ventricle: The right ventricular size is normal. No increase in right ventricular wall thickness. Right ventricular systolic function is normal. Tricuspid regurgitation signal is inadequate for assessing PA pressure.  Left Atrium: Left atrial size was normal in size.  Right Atrium: Right atrial size was normal in size.  Pericardium: There is no evidence of pericardial effusion.  Mitral Valve: The mitral valve is normal in structure. Trivial mitral valve regurgitation. No evidence of mitral valve  stenosis.  Tricuspid Valve: The tricuspid valve is normal in structure. Tricuspid valve regurgitation is trivial.  Aortic Valve: The aortic valve has been repaired/replaced. Aortic valve regurgitation is trivial. Aortic valve mean gradient measures 11.0 mmHg. Aortic valve peak gradient measures 21.3 mmHg. Aortic valve area, by VTI measures 1.83 cm. There is a 23 mm On-X mechanical valve present in the aortic position. Echo findings are consistent with normal structure and function of the aortic valve prosthesis.  Pulmonic Valve: The pulmonic valve was not well visualized. Pulmonic valve regurgitation is trivial.  Aorta: The aortic root/ascending aorta has been repaired/replaced.  Venous: The inferior vena cava is normal in size with greater than 50% respiratory variability, suggesting right atrial pressure of 3 mmHg.  IAS/Shunts: The interatrial septum was not well visualized.   LEFT VENTRICLE PLAX 2D LVIDd:         4.70 cm   Diastology LVIDs:         2.40 cm   LV e' medial:    9.57 cm/s LV PW:         1.20 cm   LV E/e' medial:  8.1 LV IVS:        1.20 cm   LV e' lateral:   11.70 cm/s LVOT diam:     2.30 cm   LV E/e' lateral: 6.6 LV SV:         85 LV SV Index:   42 LVOT Area:     4.15 cm  3D Volume EF: 3D EF:        64 % LV EDV:       168 ml LV ESV:       61 ml LV SV:        107 ml  RIGHT VENTRICLE RV Basal diam:  2.80 cm RV S prime:     12.10 cm/s TAPSE (M-mode): 1.7 cm  LEFT ATRIUM             Index        RIGHT ATRIUM           Index LA diam:  3.10 cm 1.52 cm/m   RA Area:     15.50 cm LA Vol (A2C):   28.2 ml 13.82 ml/m  RA Volume:   34.80 ml  17.06 ml/m LA Vol (A4C):   29.0 ml 14.22 ml/m LA Biplane Vol: 29.7 ml 14.56 ml/m AORTIC VALVE                     PULMONIC VALVE AV Area (Vmax):    1.77 cm      PR End Diast Vel: 4.67 msec AV Area (Vmean):   1.71 cm AV Area (VTI):     1.83 cm AV Vmax:           231.00 cm/s AV Vmean:          156.000 cm/s AV  VTI:            0.466 m AV Peak Grad:      21.3 mmHg AV Mean Grad:      11.0 mmHg LVOT Vmax:         98.20 cm/s LVOT Vmean:        64.200 cm/s LVOT VTI:          0.205 m LVOT/AV VTI ratio: 0.44  AORTA Ao Asc diam: 3.00 cm  MITRAL VALVE MV Area (PHT): 2.56 cm    SHUNTS MV Decel Time: 296 msec    Systemic VTI:  0.20 m MV E velocity: 77.60 cm/s  Systemic Diam: 2.30 cm MV A velocity: 92.20 cm/s MV E/A ratio:  0.84  Lonni Nanas MD Electronically signed by Lonni Nanas MD Signature Date/Time: 12/03/2022/10:40:02 PM    Final (Updated)   TEE  ECHO INTRAOPERATIVE TEE 09/27/2021  Narrative *INTRAOPERATIVE TRANSESOPHAGEAL REPORT *    Patient Name:   MOSIE ANGUS [facility] Date of Exam: 09/27/2021 Medical Rec #:  [id number]                      Height:       71.0 in Accession #:    [id number]                     Weight:       195.0 lb Date of Birth:  1990-08-11                       BSA:          2.09 m Patient Age:    30 years                       BP:           130/76 mmHg Patient Gender: M                              HR:           57 bpm. Exam Location:  Anesthesiology  Transesophogeal exam was perform intraoperatively during surgical procedure. Patient was closely monitored under general anesthesia during the entirety of examination.  Indications:     Aortic Valve Disease Sonographer:     Lauraine Pilot RDCS Performing Phys: Debby Like MD Diagnosing Phys: Debby Like MD  Complications: No known complications during this procedure. POST-OP IMPRESSIONS _ Left Ventricle: The left ventricle is unchanged from pre-bypass. _ Right Ventricle: The right ventricle appears unchanged from pre-bypass. _ Aorta: A graft was placed in the ascending aorta for  repair. _ Left Atrium: The left atrium appears unchanged from pre-bypass. _ Left Atrial Appendage: The left atrial appendage appears unchanged from pre-bypass. _ Aortic Valve: A bileaflet mechanical  valve was placed Manufactured by; On-X Size; 23mm. No regurgitation post repair. No perivalvular leak noted. _ Mitral Valve: The mitral valve appears unchanged from pre-bypass. _ Tricuspid Valve: The tricuspid valve appears unchanged from pre-bypass. _ Pulmonic Valve: The pulmonic valve appears unchanged from pre-bypass. _ Interatrial Septum: The interatrial septum appears unchanged from pre-bypass. _ Interventricular Septum: The interventricular septum appears unchanged from pre-bypass. _ Pericardium: The pericardium appears unchanged from pre-bypass.  PRE-OP FINDINGS Left Ventricle: The left ventricle has normal systolic function, with an ejection fraction of 60-65%. The cavity size was normal. There is no left ventricular hypertrophy.   Right Ventricle: The right ventricle has normal systolic function. The cavity was normal. There is no increase in right ventricular wall thickness.  Left Atrium: Left atrial size was normal in size. No left atrial/left atrial appendage thrombus was detected.  Right Atrium: Right atrial size was normal in size.  Interatrial Septum: No atrial level shunt detected by color flow Doppler.  Pericardium: There is no evidence of pericardial effusion.  Mitral Valve: The mitral valve is normal in structure. Mitral valve regurgitation is trivial by color flow Doppler. There is No evidence of mitral stenosis.  Tricuspid Valve: The tricuspid valve was normal in structure. Tricuspid valve regurgitation was not visualized by color flow Doppler. No evidence of tricuspid stenosis is present.  Aortic Valve: The aortic valve is bicuspid Aortic valve regurgitation is moderate by color flow Doppler. The jet is eccentric. There is moderate stenosis of the aortic valve.   Pulmonic Valve: The pulmonic valve was normal in structure. Pulmonic valve regurgitation is not visualized by color flow Doppler.   Aorta: The is normal in size and structure. There is moderate  dilatation of the ascending aorta.  +-------------+---------++ AORTIC VALVE           +-------------+---------++ AV Mean Grad:14.0 mmHg +-------------+---------++   Debby Like MD Electronically signed by Debby Like MD Signature Date/Time: 09/27/2021/3:12:13 PM    Final        ______________________________________________________________________________________________      Recent Labs: 04/06/2024: Magnesium  2.0 04/08/2024: ALT 62; BUN 18; Creatinine, Ser 0.60; Hemoglobin 7.9; Platelets 324; Potassium 4.0; Sodium 133   Lipid Panel    Component Value Date/Time   CHOL 119 12/07/2023 1230   CHOL 188 02/15/2023 1353   TRIG 105 04/06/2024 0632   HDL 26 (L) 12/07/2023 1230   HDL 53 02/15/2023 1353   CHOLHDL 4.6 12/07/2023 1230   VLDL 17 12/07/2023 1230   LDLCALC 76 12/07/2023 1230   LDLCALC 107 (H) 02/15/2023 1353    Risk Assessment/Calculations:          Physical Exam:   VS:  There were no vitals taken for this visit.   No BP recorded.  {Refresh Note OR Click here to enter BP  :1}***   Wt Readings from Last 3 Encounters:  04/08/24 137 lb 12.6 oz (62.5 kg)  03/25/24 140 lb (63.5 kg)  03/09/24 145 lb (65.8 kg)      GENERAL:  No apparent distress, AOx3 HEENT:  No carotid bruits, +2 carotid impulses, no scleral icterus CAR: RRR mechanical S2  RES:  Clear to auscultation bilaterally ABD:  Soft, nontender, nondistended, positive bowel sounds x 4 VASC:  +2 radial pulses, +2 carotid pulses NEURO:  CN 2-12 grossly intact; motor and sensory grossly  intact PSYCH:  No active depression or anxiety EXT:  No edema, ecchymosis, or cyanosis  Signed, Cheyenne Bordeaux K Ken Bonn, MD  05/01/2024 8:29 AM    [facility] [address] [place], [place], [place]  [address] Phone: [phone]; Fax: [phone]   Note:  This document was prepared using Dragon voice recognition software and may include unintentional dictation errors.

## 2024-05-01 NOTE — Telephone Encounter (Signed)
 Called patient since and there was no answer so left a message. He has an appointment today with the Anticoagulation Clinic and should already be holding his warfarin for his upcoming procedure on Monday, 05/04/24. If patient has been holding his warfarin since 04/28/24 then the appointment would not be helpful. Patient will need an appointment the week after his procedure/upon resuming warfarin for at least 7 days.

## 2024-05-01 NOTE — Telephone Encounter (Signed)
 See alternate note for further communications

## 2024-05-04 ENCOUNTER — Ambulatory Visit (HOSPITAL_COMMUNITY): Admission: RE | Admit: 2024-05-04 | Source: Home / Self Care | Admitting: Gastroenterology

## 2024-05-04 SURGERY — ERCP, WITH INTERVENTION IF INDICATED
Anesthesia: General

## 2024-05-04 NOTE — Telephone Encounter (Signed)
 See note below:  Claudene Naomie SAILOR, RN    04/09/24  8:25 AM Note Patient is scheduled with Dr Maude Dawn at [facility] today, 04/09/24 for evaluation.     Claudene Naomie SAILOR, RN    04/02/24 10:08 AM Note [facility] referral placed to Dr Ginette. Will perform periodic chart review to insure patient is scheduled.

## 2024-05-04 NOTE — Telephone Encounter (Signed)
 Given long wait for procedure with Dr. Wilhelmenia I would recommend we refer to [facility] for the possibility of an early ERCP He continues to have significant pain and nutritional compromise related to his pancreatitis Also he should have consult with [facility] (Dr. Ginette or colleague) in the works as well...SABRASABRASABRAis this scheduled? JMP

## 2024-05-11 ENCOUNTER — Other Ambulatory Visit: Payer: Self-pay | Admitting: Internal Medicine

## 2024-05-11 ENCOUNTER — Encounter: Payer: Self-pay | Admitting: Internal Medicine

## 2024-05-11 DIAGNOSIS — K859 Acute pancreatitis without necrosis or infection, unspecified: Secondary | ICD-10-CM

## 2024-05-11 MED ORDER — OXYCODONE HCL 10 MG PO TABS: 10.0000 mg | ORAL_TABLET | ORAL | 0 refills | Status: DC | PRN

## 2024-05-11 NOTE — Progress Notes (Signed)
 Pain medication refilled while waiting on pancreatic surgery Acute on chronic pancreatitis

## 2024-05-26 ENCOUNTER — Encounter: Payer: Self-pay | Admitting: Internal Medicine

## 2024-05-27 ENCOUNTER — Other Ambulatory Visit: Payer: Self-pay | Admitting: Internal Medicine

## 2024-05-27 DIAGNOSIS — K859 Acute pancreatitis without necrosis or infection, unspecified: Secondary | ICD-10-CM

## 2024-05-27 MED ORDER — OXYCODONE HCL 10 MG PO TABS: 10.0000 mg | ORAL_TABLET | ORAL | 0 refills | Status: DC | PRN

## 2024-05-27 NOTE — Progress Notes (Signed)
 Oxycodone  refilled in the setting of acute on chronic pancreatitis He is tied in with [facility] and they are considering surgery within the next 4-6 weeks

## 2024-05-28 MED ORDER — METOPROLOL SUCCINATE ER 25 MG PO TB24: 12.5000 mg | ORAL_TABLET | Freq: Every day | ORAL | 0 refills | Status: AC

## 2024-06-12 ENCOUNTER — Encounter: Payer: Self-pay | Admitting: Internal Medicine

## 2024-06-12 DIAGNOSIS — K859 Acute pancreatitis without necrosis or infection, unspecified: Secondary | ICD-10-CM

## 2024-06-15 MED ORDER — OXYCODONE HCL 10 MG PO TABS: 10.0000 mg | ORAL_TABLET | ORAL | 0 refills | Status: DC | PRN

## 2024-06-15 NOTE — Telephone Encounter (Signed)
 Refill of oxycodone  for acute on chronic pancreatitis Surgical management pending at [facility]

## 2024-06-16 ENCOUNTER — Ambulatory Visit

## 2024-06-16 ENCOUNTER — Other Ambulatory Visit (HOSPITAL_COMMUNITY): Payer: Self-pay

## 2024-06-16 DIAGNOSIS — Z7901 Long term (current) use of anticoagulants: Secondary | ICD-10-CM

## 2024-06-16 DIAGNOSIS — Z952 Presence of prosthetic heart valve: Secondary | ICD-10-CM

## 2024-06-16 LAB — POCT INR: INR: 1 — AB (ref 2.0–3.0)

## 2024-06-16 MED ORDER — WARFARIN SODIUM 3 MG PO TABS
4.5000 mg | ORAL_TABLET | Freq: Every day | ORAL | 0 refills | Status: AC
  Filled 2024-06-16: qty 60, 30d supply, fill #0

## 2024-06-16 NOTE — Progress Notes (Signed)
 Description   INR 1.0. Restart warfarin 1.5 tablets daily except  2 tablets on Monday and Thursdays. Recheck INR in 1 week Coumadin  Clinic [phone]

## 2024-06-16 NOTE — Patient Instructions (Addendum)
 Description   INR 1.0. Restart warfarin 1.5 tablets daily except  2 tablets on Monday and Thursdays. Recheck INR in 1 week Coumadin  Clinic [phone]

## 2024-06-26 ENCOUNTER — Ambulatory Visit: Admitting: Family

## 2024-06-26 ENCOUNTER — Ambulatory Visit: Attending: Internal Medicine | Admitting: *Deleted

## 2024-06-26 DIAGNOSIS — F419 Anxiety disorder, unspecified: Secondary | ICD-10-CM | POA: Diagnosis not present

## 2024-06-26 DIAGNOSIS — Z7901 Long term (current) use of anticoagulants: Secondary | ICD-10-CM

## 2024-06-26 DIAGNOSIS — Z952 Presence of prosthetic heart valve: Secondary | ICD-10-CM

## 2024-06-26 DIAGNOSIS — F32A Depression, unspecified: Secondary | ICD-10-CM | POA: Diagnosis not present

## 2024-06-26 LAB — POCT INR: INR: 1.6 — AB (ref 2.0–3.0)

## 2024-06-26 MED ORDER — SERTRALINE HCL 25 MG PO TABS: 25.0000 mg | ORAL_TABLET | Freq: Every day | ORAL | 0 refills | Status: AC

## 2024-06-26 NOTE — Progress Notes (Signed)
 Description   INR 1.6; Continue taking warfarin 1.5 tablets daily except  2 tablets on Monday and Thursdays. Recheck INR in 3 weeks.  Coumadin  Clinic [phone]

## 2024-06-26 NOTE — Patient Instructions (Signed)
 It was very nice to see you today!   I have sent over the new medication Zoloft  (Sertraline ) to your pharmacy. Follow directions on the bottle. But let me know if the warfarin clinic does not agree with this choice. I have sent a referral to our therapy team for counseling, but you have to call their office to schedule your first appointment. Start with [facility] at [phone] if they can't schedule you quickly you can try [facility] at [phone].   Schedule a 4 week follow up appointment, virtual ok.        PLEASE NOTE:  If you had any lab tests please let us  know if you have not heard back within a few days. You may see your results on MyChart before we have a chance to review them but we will give you a call once they are reviewed by us . If we ordered any referrals today, please let us  know if you have not heard from their office within the next week.

## 2024-06-26 NOTE — Patient Instructions (Signed)
 Description   INR 1.6; Continue taking warfarin 1.5 tablets daily except  2 tablets on Monday and Thursdays. Recheck INR in 3 weeks.  Coumadin  Clinic [phone]

## 2024-06-26 NOTE — Progress Notes (Signed)
 "  Patient ID: [name] [name], male    DOB: 09-19-1990, 34 y.o.   MRN: [id number]  Chief Complaint  Patient presents with    Anxiety And Depression  Discussed the use of AI scribe software for clinical note transcription with the patient, who gave verbal consent to proceed.  History of Present Illness [name] [name] is a 34 year old male who presents with worsening depressive symptoms and difficulty managing chronic pain.  He reports worsening depressed mood related to ongoing medical problems, including difficulty with weight management and eating. He has never used antidepressants and is concerned about potential interactions with his blood thinner. He has chronic pain treated with oxycodone  10 mg. Using his feeding tube increases his pain, so he prefers to eat by mouth despite discomfort. He notes oxycodone  also improves his mood and he is worried about dependence. He periodically tries to lower his dose to avoid overuse. His nutrition has improved. He is no longer losing weight and consistently eats two meals daily, though three meals is still difficult. He still has a feeding tube but uses it infrequently because of pain. He has significant fatigue and low motivation and often spends one day a week in bed due to depression and physical discomfort. He wants additional strategies to manage depression and pain without increasing opioid use. He notes some improvement in overall energy but still struggles with low motivation. He recently moved in with his aunt due to inability to work. This has improved his motivation to cook and he walks her dog regularly, but he finds the reduced privacy stressful. He is on a blood thinner and is supposed to check his INR every two weeks, though he has missed checks in the past. He denies recent epistaxis or hematuria.   Assessment & Plan Depression and anxiety Increased depressive symptoms due to chronic pain and life  changes. Lexapro chosen for lower interaction risk with blood thinners and mood benefits. Discussed side effects and emphasized INR monitoring. - Initiated generic Zoloft  25 mg daily. - Increase dose to 50 mg after two weeks if tolerated. - Follow-up in four weeks, virtual or in-office. - Referred to therapist for counseling. - Monitor INR and bleeding signs related to starting the Zoloft  in the beginning.  Chronic pain requiring opioid therapy Chronic pain managed with oxycodone , causing euphoria and contributing to depressive symptoms. Discussed alternative pain management and minimizing opioid use. - Continue current opioid regimen with caution. - Consider using muscle relaxers more often for pain management if tolerated. g   Subjective:    Outpatient Medications Prior to Visit  Medication Sig Dispense Refill   acetaminophen  (TYLENOL ) 500 MG tablet Take 1,000 mg by mouth every 6 (six) hours as needed for moderate pain (pain score 4-6).     aspirin  EC 81 MG EC tablet Take 1 tablet (81 mg total) by mouth daily. Swallow whole. 30 tablet 11   enoxaparin  (LOVENOX ) 60 MG/0.6ML injection Inject 0.6 mLs (60 mg total) into the skin every 12 (twelve) hours. 5 mL 0   hydrOXYzine  (ATARAX ) 25 MG tablet Take 1 tablet (25 mg total) by mouth 3 (three) times daily as needed for anxiety. 30 tablet 0   lipase/protease/amylase (CREON ) 36000 UNITS CPEP capsule Take 2 capsules (72,000 Units total) by mouth 3 (three) times daily before meals.     loratadine  (CLARITIN ) 10 MG tablet Take 10 mg by mouth daily.     methocarbamol  (ROBAXIN ) 500 MG tablet Take 1-2 tablets (500-1,000  mg total) by mouth 4 (four) times daily. (Patient taking differently: Take 500 mg by mouth daily as needed for muscle spasms.) 30 tablet 0   metoprolol  succinate (TOPROL  XL) 25 MG 24 hr tablet Take 0.5 tablets (12.5 mg total) by mouth at bedtime. Patient must schedule annual office visit for refills first attempt 15 tablet 0   ondansetron   (ZOFRAN -ODT) 4 MG disintegrating tablet Take 4 mg by mouth every 8 (eight) hours as needed.     Oxycodone  HCl 10 MG TABS Take 1 tablet (10 mg total) by mouth every 4 (four) hours as needed. 84 tablet 0   pantoprazole  (PROTONIX ) 40 MG tablet Take 1 tablet (40 mg total) by mouth 2 (two) times daily before a meal. 60 tablet 0   polyethylene glycol powder (GLYCOLAX /MIRALAX ) 17 GM/SCOOP powder Place 1 capful (17 g) into feeding tube 2 (two) times daily. (Patient taking differently: Take 17 g by mouth daily as needed for mild constipation.) 238 g 0   Vitamin D, Ergocalciferol, (DRISDOL) 1.25 MG (50000 UNIT) CAPS capsule Take 50,000 Units by mouth.     warfarin (COUMADIN ) 3 MG tablet Take 1&1/2-2 tablets (4.5-6 mg total) by mouth daily OR AS DIRECTED BY ANTICOAGULATION CLINIC. 60 tablet 0   No facility-administered medications prior to visit.   Past Medical History:  Diagnosis Date   Abnormal finding on GI tract imaging 12/07/2023   Aortic valve calcification 06/08/2020   Asthma    Headache    migraines   Heart murmur    Hypokalemia 06/08/2020   Leukocytosis 01/23/2022   Mild aortic stenosis    Pancreatitis    Pneumonia    as a child x several   Substance abuse (HCC)    Subtherapeutic international normalized ratio (INR) 01/23/2022   Transaminitis 06/08/2020   Past Surgical History:  Procedure Laterality Date   AORTIC VALVE REPLACEMENT N/A 09/27/2021   Procedure: AORTIC VALVE REPLACEMENT (AVR) USING ON-X AORTIC VALVE SIZE ;  Surgeon: Kerrin Elspeth BROCKS, MD;  Location: [facility] OR;  Service: Open Heart Surgery;  Laterality: N/A;   BIOPSY  08/01/2023   Procedure: BIOPSY;  Surgeon: Wilhelmenia Aloha Raddle., MD;  Location: [facility];  Service: Gastroenterology;;   ERCP N/A 03/09/2024   Procedure: ERCP, WITH INTERVENTION IF INDICATED;  Surgeon: Wilhelmenia Aloha Raddle., MD;  Location: [facility];  Service: Gastroenterology;  Laterality: N/A;  aborted   ESOPHAGOGASTRODUODENOSCOPY N/A 08/01/2023    Procedure: ESOPHAGOGASTRODUODENOSCOPY (EGD);  Surgeon: Wilhelmenia Aloha Raddle., MD;  Location: [facility];  Service: Gastroenterology;  Laterality: N/A;   ESOPHAGOGASTRODUODENOSCOPY N/A 03/09/2024   Procedure: EGD (ESOPHAGOGASTRODUODENOSCOPY);  Surgeon: Wilhelmenia Aloha Raddle., MD;  Location: [facility];  Service: Gastroenterology;  Laterality: N/A;   EUS N/A 08/01/2023   Procedure: UPPER ENDOSCOPIC ULTRASOUND (EUS) RADIAL;  Surgeon: Wilhelmenia Aloha Raddle., MD;  Location: [facility];  Service: Gastroenterology;  Laterality: N/A;   FINE NEEDLE ASPIRATION N/A 08/01/2023   Procedure: FINE NEEDLE ASPIRATION (FNA) LINEAR;  Surgeon: Wilhelmenia Aloha Raddle., MD;  Location: [facility];  Service: Gastroenterology;  Laterality: N/A;   INGUINAL HERNIA REPAIR Right 01/05/2022   Procedure: OPEN REPAIR INCARCERATED RIGHT INGUINAL HERNIA WITH MESH;  Surgeon: Tanda Locus, MD;  Location: [facility];  Service: General;  Laterality: Right;  90 MIN - ROOM 2   INGUINAL HERNIA REPAIR Left 12/20/2022   Procedure: OPEN REPAIR LEFT INGUINAL HERNIA WITH MESH;  Surgeon: Tanda Locus, MD;  Location: [facility];  Service: General;  Laterality: Left;   IR CM INJ ANY COLONIC TUBE W/FLUORO  04/21/2024   IR GASTR TUBE CONVERT GASTR-JEJ PER W/FL MOD SED  02/13/2024   IR GASTROSTOMY TUBE MOD SED  02/13/2024   IR GASTROSTOMY TUBE MOD SED  04/03/2024   IR GJ TUBE CHANGE  03/19/2024   IR GJ TUBE CHANGE  03/30/2024   IR GJ TUBE CHANGE  04/13/2024   IR US  GUIDE BX ASP/DRAIN  04/03/2024   REPLACEMENT ASCENDING AORTA N/A 09/27/2021   Procedure: REPLACEMENT ASCENDING AND INNOMINATE ANEURYSM USING HEMASHIELD PLATINUM [id number];  Surgeon: Kerrin Elspeth BROCKS, MD;  Location: [facility] OR;  Service: Open Heart Surgery;  Laterality: N/A;   RIGHT HEART CATH AND CORONARY ANGIOGRAPHY N/A 09/07/2021   Procedure: RIGHT HEART CATH AND CORONARY ANGIOGRAPHY;  Surgeon: Wendel Lurena POUR, MD;  Location: [facility];  Service: Cardiovascular;   Laterality: N/A;   TEE WITHOUT CARDIOVERSION N/A 09/27/2021   Procedure: TRANSESOPHAGEAL ECHOCARDIOGRAM (TEE);  Surgeon: Kerrin Elspeth BROCKS, MD;  Location: [facility] OR;  Service: Open Heart Surgery;  Laterality: N/A;   Allergies[1]    Objective:    Physical Exam Vitals and nursing note reviewed.  Constitutional:      General: He is not in acute distress.    Appearance: Normal appearance.  HENT:     Head: Normocephalic.  Cardiovascular:     Rate and Rhythm: Normal rate and regular rhythm.  Pulmonary:     Effort: Pulmonary effort is normal.     Breath sounds: Normal breath sounds.  Musculoskeletal:        General: Normal range of motion.     Cervical back: Normal range of motion.  Skin:    General: Skin is warm and dry.  Neurological:     Mental Status: He is alert and oriented to person, place, and time.  Psychiatric:        Mood and Affect: Mood normal.    BP 128/72 (BP Location: Left Arm, Patient Position: Sitting, Cuff Size: Normal)   Pulse 64   Temp 98 F (36.7 C) (Temporal)   Ht 5' 11 (1.803 m)   Wt 147 lb 6 oz (66.8 kg)   SpO2 95%   BMI 20.55 kg/m  Wt Readings from Last 3 Encounters:  06/26/24 147 lb 6 oz (66.8 kg)  04/08/24 137 lb 12.6 oz (62.5 kg)  03/25/24 140 lb (63.5 kg)       Nahima Ales, NP     [1]  Allergies Allergen Reactions   Amoxicillin Hives   "

## 2024-06-29 ENCOUNTER — Encounter: Payer: Self-pay | Admitting: Internal Medicine

## 2024-06-30 ENCOUNTER — Other Ambulatory Visit: Payer: Self-pay | Admitting: Internal Medicine

## 2024-06-30 ENCOUNTER — Other Ambulatory Visit: Payer: Self-pay

## 2024-06-30 ENCOUNTER — Telehealth: Payer: Self-pay | Admitting: Internal Medicine

## 2024-06-30 DIAGNOSIS — K859 Acute pancreatitis without necrosis or infection, unspecified: Secondary | ICD-10-CM

## 2024-06-30 MED ORDER — PANCRELIPASE (LIP-PROT-AMYL) 36000-114000 UNITS PO CPEP: 72000.0000 [IU] | ORAL_CAPSULE | Freq: Three times a day (TID) | ORAL | 1 refills | Status: AC

## 2024-06-30 MED ORDER — OXYCODONE HCL 10 MG PO TABS: 10.0000 mg | ORAL_TABLET | ORAL | 0 refills | Status: DC | PRN

## 2024-06-30 NOTE — Telephone Encounter (Signed)
 Inbound call from patient stating he is still needing medication oxycodone  refilled Please advise  Thank you

## 2024-06-30 NOTE — Telephone Encounter (Signed)
Patient notified and he verbalized understanding.  

## 2024-06-30 NOTE — Telephone Encounter (Signed)
 Please see below confirmed receipt at the pharmacy. Patient is requesting a refill.   Oxycodone  HCl 10 MG TABS 84 tablet 0 06/15/2024 --   Sig - Route: Take 1 tablet (10 mg total) by mouth every 4 (four) hours as needed. - Oral   Sent to pharmacy as: Oxycodone  HCl 10 MG TABS   Earliest Fill Date: 06/15/2024   E-Prescribing Status: Receipt confirmed by pharmacy (06/15/2024 11:27 AM EST)

## 2024-06-30 NOTE — Telephone Encounter (Signed)
 Refilled Thanks JMP

## 2024-06-30 NOTE — Progress Notes (Signed)
 Oxycodone  refill for moderate to severe pancreatitis

## 2024-07-14 ENCOUNTER — Encounter: Payer: Self-pay | Admitting: Internal Medicine

## 2024-07-14 ENCOUNTER — Other Ambulatory Visit: Payer: Self-pay | Admitting: Internal Medicine

## 2024-07-14 DIAGNOSIS — K859 Acute pancreatitis without necrosis or infection, unspecified: Secondary | ICD-10-CM

## 2024-07-14 MED ORDER — OXYCODONE HCL 10 MG PO TABS: 10.0000 mg | ORAL_TABLET | ORAL | 0 refills | Status: AC | PRN

## 2024-07-14 NOTE — Progress Notes (Signed)
 Refill

## 2024-07-17 ENCOUNTER — Ambulatory Visit

## 2024-07-17 DIAGNOSIS — Z7901 Long term (current) use of anticoagulants: Secondary | ICD-10-CM

## 2024-07-17 DIAGNOSIS — Z952 Presence of prosthetic heart valve: Secondary | ICD-10-CM

## 2024-07-17 LAB — POCT INR: INR: 1.4 — AB (ref 2.0–3.0)

## 2024-07-17 NOTE — Progress Notes (Signed)
 INR 1.4  Take 2 tablets today only then Continue taking warfarin 1.5 tablets daily except  2 tablets on Monday and Wednesdays. Recheck INR in 4 weeks.  Coumadin  Clinic [phone]

## 2024-07-17 NOTE — Patient Instructions (Signed)
 Take 2 tablets today only then Continue taking warfarin 1.5 tablets daily except  2 tablets on Monday and Wednesdays. Recheck INR in 4 weeks.  Coumadin  Clinic [phone]

## 2024-07-24 ENCOUNTER — Ambulatory Visit: Admitting: Family

## 2024-07-30 ENCOUNTER — Ambulatory Visit: Admitting: Physician Assistant

## 2024-08-13 ENCOUNTER — Inpatient Hospital Stay (HOSPITAL_COMMUNITY): Admit: 2024-08-13

## 2024-08-14 ENCOUNTER — Ambulatory Visit

## 2024-09-11 ENCOUNTER — Encounter: Admitting: Family
